# Patient Record
Sex: Female | Born: 1935 | Race: Black or African American | Hispanic: No | Marital: Single | Smoking: Former smoker
Health system: Southern US, Community
[De-identification: ages and names within clinical notes are randomized; demographics above are authoritative.]

## PROBLEM LIST (undated history)

## (undated) DIAGNOSIS — I495 Sick sinus syndrome: Secondary | ICD-10-CM

## (undated) DIAGNOSIS — Z972 Presence of dental prosthetic device (complete) (partial): Secondary | ICD-10-CM

## (undated) DIAGNOSIS — J189 Pneumonia, unspecified organism: Secondary | ICD-10-CM

## (undated) DIAGNOSIS — E079 Disorder of thyroid, unspecified: Secondary | ICD-10-CM

## (undated) DIAGNOSIS — C801 Malignant (primary) neoplasm, unspecified: Secondary | ICD-10-CM

## (undated) DIAGNOSIS — E119 Type 2 diabetes mellitus without complications: Secondary | ICD-10-CM

## (undated) DIAGNOSIS — M1990 Unspecified osteoarthritis, unspecified site: Secondary | ICD-10-CM

## (undated) DIAGNOSIS — R600 Localized edema: Secondary | ICD-10-CM

## (undated) DIAGNOSIS — N390 Urinary tract infection, site not specified: Secondary | ICD-10-CM

## (undated) DIAGNOSIS — Z96659 Presence of unspecified artificial knee joint: Secondary | ICD-10-CM

## (undated) DIAGNOSIS — R0682 Tachypnea, not elsewhere classified: Secondary | ICD-10-CM

## (undated) DIAGNOSIS — I10 Essential (primary) hypertension: Secondary | ICD-10-CM

## (undated) DIAGNOSIS — I5032 Chronic diastolic (congestive) heart failure: Secondary | ICD-10-CM

## (undated) DIAGNOSIS — Z973 Presence of spectacles and contact lenses: Secondary | ICD-10-CM

## (undated) DIAGNOSIS — R609 Edema, unspecified: Secondary | ICD-10-CM

## (undated) DIAGNOSIS — I4891 Unspecified atrial fibrillation: Secondary | ICD-10-CM

## (undated) DIAGNOSIS — H409 Unspecified glaucoma: Secondary | ICD-10-CM

## (undated) DIAGNOSIS — K219 Gastro-esophageal reflux disease without esophagitis: Secondary | ICD-10-CM

## (undated) DIAGNOSIS — R32 Unspecified urinary incontinence: Secondary | ICD-10-CM

## (undated) HISTORY — DX: Sick sinus syndrome: I49.5

## (undated) HISTORY — DX: Presence of spectacles and contact lenses: Z97.3

## (undated) HISTORY — DX: Malignant (primary) neoplasm, unspecified: C80.1

## (undated) HISTORY — PX: COLONOSCOPY: SHX174

## (undated) HISTORY — PX: ABDOMINAL HYSTERECTOMY: SHX81

## (undated) HISTORY — PX: CARPAL TUNNEL RELEASE: SHX101

## (undated) HISTORY — DX: Unspecified glaucoma: H40.9

## (undated) HISTORY — DX: Essential (primary) hypertension: I10

## (undated) HISTORY — PX: APPENDECTOMY: SHX54

## (undated) HISTORY — PX: SHOULDER SURGERY: SHX246

## (undated) HISTORY — DX: Unspecified osteoarthritis, unspecified site: M19.90

## (undated) HISTORY — PX: EYE SURGERY: SHX253

## (undated) HISTORY — PX: TOTAL HIP ARTHROPLASTY: SHX124

## (undated) HISTORY — DX: Localized edema: R60.0

## (undated) HISTORY — DX: Chronic diastolic (congestive) heart failure: I50.32

## (undated) HISTORY — DX: Presence of unspecified artificial knee joint: Z96.659

## (undated) HISTORY — DX: Unspecified urinary incontinence: R32

## (undated) HISTORY — DX: Disorder of thyroid, unspecified: E07.9

## (undated) HISTORY — PX: TONSILLECTOMY: SUR1361

## (undated) HISTORY — DX: Presence of dental prosthetic device (complete) (partial): Z97.2

---

## 1997-07-30 ENCOUNTER — Other Ambulatory Visit: Admission: RE | Admit: 1997-07-30 | Discharge: 1997-07-30 | Payer: Self-pay | Admitting: Cardiology

## 1997-08-06 ENCOUNTER — Ambulatory Visit (HOSPITAL_COMMUNITY): Admission: RE | Admit: 1997-08-06 | Discharge: 1997-08-06 | Payer: Self-pay | Admitting: Cardiology

## 1998-03-12 ENCOUNTER — Other Ambulatory Visit: Admission: RE | Admit: 1998-03-12 | Discharge: 1998-03-12 | Payer: Self-pay | Admitting: *Deleted

## 1999-03-03 ENCOUNTER — Encounter: Admission: RE | Admit: 1999-03-03 | Discharge: 1999-04-16 | Payer: Self-pay | Admitting: *Deleted

## 2000-01-27 ENCOUNTER — Encounter: Admission: RE | Admit: 2000-01-27 | Discharge: 2000-02-22 | Payer: Self-pay | Admitting: *Deleted

## 2000-03-08 ENCOUNTER — Encounter: Payer: Self-pay | Admitting: *Deleted

## 2000-03-08 ENCOUNTER — Encounter: Admission: RE | Admit: 2000-03-08 | Discharge: 2000-03-08 | Payer: Self-pay | Admitting: *Deleted

## 2000-10-04 ENCOUNTER — Encounter: Payer: Self-pay | Admitting: Orthopedic Surgery

## 2000-10-10 ENCOUNTER — Inpatient Hospital Stay (HOSPITAL_COMMUNITY): Admission: RE | Admit: 2000-10-10 | Discharge: 2000-10-13 | Payer: Self-pay | Admitting: Orthopedic Surgery

## 2000-10-10 ENCOUNTER — Encounter: Payer: Self-pay | Admitting: Orthopedic Surgery

## 2000-10-13 ENCOUNTER — Inpatient Hospital Stay
Admission: RE | Admit: 2000-10-13 | Discharge: 2000-10-25 | Payer: Self-pay | Admitting: Physical Medicine & Rehabilitation

## 2000-10-16 ENCOUNTER — Encounter: Payer: Self-pay | Admitting: Physical Medicine and Rehabilitation

## 2000-10-16 ENCOUNTER — Ambulatory Visit (HOSPITAL_COMMUNITY)
Admission: RE | Admit: 2000-10-16 | Discharge: 2000-10-16 | Payer: Self-pay | Admitting: Physical Medicine & Rehabilitation

## 2001-03-20 ENCOUNTER — Encounter: Payer: Self-pay | Admitting: Orthopedic Surgery

## 2001-03-20 ENCOUNTER — Inpatient Hospital Stay (HOSPITAL_COMMUNITY): Admission: RE | Admit: 2001-03-20 | Discharge: 2001-03-23 | Payer: Self-pay | Admitting: Orthopedic Surgery

## 2001-03-23 ENCOUNTER — Inpatient Hospital Stay
Admission: RE | Admit: 2001-03-23 | Discharge: 2001-04-04 | Payer: Self-pay | Admitting: Physical Medicine & Rehabilitation

## 2001-03-27 ENCOUNTER — Encounter (HOSPITAL_COMMUNITY)
Admission: RE | Admit: 2001-03-27 | Discharge: 2001-03-31 | Payer: Self-pay | Admitting: Physical Medicine & Rehabilitation

## 2001-03-27 ENCOUNTER — Encounter: Payer: Self-pay | Admitting: Physical Medicine & Rehabilitation

## 2001-03-31 ENCOUNTER — Encounter: Payer: Self-pay | Admitting: Physical Medicine & Rehabilitation

## 2001-09-14 ENCOUNTER — Encounter: Admission: RE | Admit: 2001-09-14 | Discharge: 2001-09-14 | Payer: Self-pay | Admitting: *Deleted

## 2001-09-14 ENCOUNTER — Encounter: Payer: Self-pay | Admitting: *Deleted

## 2001-09-21 ENCOUNTER — Encounter: Admission: RE | Admit: 2001-09-21 | Discharge: 2001-09-21 | Payer: Self-pay | Admitting: *Deleted

## 2001-09-21 ENCOUNTER — Encounter: Payer: Self-pay | Admitting: *Deleted

## 2002-01-03 ENCOUNTER — Encounter: Admission: RE | Admit: 2002-01-03 | Discharge: 2002-01-03 | Payer: Self-pay

## 2002-03-15 ENCOUNTER — Encounter (INDEPENDENT_AMBULATORY_CARE_PROVIDER_SITE_OTHER): Payer: Self-pay | Admitting: Specialist

## 2002-03-15 ENCOUNTER — Ambulatory Visit (HOSPITAL_COMMUNITY): Admission: RE | Admit: 2002-03-15 | Discharge: 2002-03-15 | Payer: Self-pay | Admitting: Gastroenterology

## 2002-10-03 ENCOUNTER — Encounter: Admission: RE | Admit: 2002-10-03 | Discharge: 2002-10-03 | Payer: Self-pay

## 2003-01-01 ENCOUNTER — Encounter: Admission: RE | Admit: 2003-01-01 | Discharge: 2003-01-01 | Payer: Self-pay

## 2004-11-09 ENCOUNTER — Encounter (HOSPITAL_COMMUNITY): Admission: RE | Admit: 2004-11-09 | Discharge: 2004-12-29 | Payer: Self-pay | Admitting: Endocrinology

## 2004-12-02 ENCOUNTER — Ambulatory Visit (HOSPITAL_COMMUNITY): Admission: RE | Admit: 2004-12-02 | Discharge: 2004-12-02 | Payer: Self-pay | Admitting: Endocrinology

## 2004-12-25 ENCOUNTER — Ambulatory Visit (HOSPITAL_COMMUNITY): Admission: RE | Admit: 2004-12-25 | Discharge: 2004-12-25 | Payer: Self-pay | Admitting: Endocrinology

## 2004-12-25 ENCOUNTER — Encounter (INDEPENDENT_AMBULATORY_CARE_PROVIDER_SITE_OTHER): Payer: Self-pay | Admitting: *Deleted

## 2005-01-15 ENCOUNTER — Ambulatory Visit (HOSPITAL_COMMUNITY): Admission: RE | Admit: 2005-01-15 | Discharge: 2005-01-15 | Payer: Self-pay | Admitting: Endocrinology

## 2006-02-16 ENCOUNTER — Encounter: Admission: RE | Admit: 2006-02-16 | Discharge: 2006-02-16 | Payer: Self-pay | Admitting: Family Medicine

## 2006-11-01 ENCOUNTER — Emergency Department: Payer: Self-pay

## 2006-11-08 ENCOUNTER — Emergency Department (HOSPITAL_COMMUNITY): Admission: EM | Admit: 2006-11-08 | Discharge: 2006-11-08 | Payer: Self-pay | Admitting: Emergency Medicine

## 2007-09-26 ENCOUNTER — Encounter: Admission: RE | Admit: 2007-09-26 | Discharge: 2007-09-26 | Payer: Self-pay | Admitting: Family Medicine

## 2008-10-16 ENCOUNTER — Encounter: Admission: RE | Admit: 2008-10-16 | Discharge: 2008-10-16 | Payer: Self-pay | Admitting: Family Medicine

## 2009-11-07 ENCOUNTER — Encounter: Admission: RE | Admit: 2009-11-07 | Discharge: 2009-11-07 | Payer: Self-pay | Admitting: Family Medicine

## 2010-02-05 ENCOUNTER — Encounter: Admission: RE | Admit: 2010-02-05 | Discharge: 2010-02-05 | Payer: Self-pay | Admitting: Family Medicine

## 2010-02-17 ENCOUNTER — Encounter: Admission: RE | Admit: 2010-02-17 | Discharge: 2010-02-17 | Payer: Self-pay | Admitting: Family Medicine

## 2010-04-26 ENCOUNTER — Encounter: Payer: Self-pay | Admitting: Family Medicine

## 2010-04-27 ENCOUNTER — Encounter: Payer: Self-pay | Admitting: Family Medicine

## 2010-08-21 NOTE — Discharge Summary (Signed)
. [facility]  Patient:    Natasha Fletcher, Natasha Fletcher Visit Number: [id number] MRN: [id number]          Service Type: ECR Location: SACU 4528 01 Attending Physician:  Faith Rogue T Dictated by:   Dian Situ, PA Admit Date:  03/23/2001 Discharge Date: 04/04/2001   CC:         Ollen Gross, M.D.  Heather Roberts, M.D.   Discharge Summary  DISCHARGE DIAGNOSES: 1. Status post total hip replacement. 2. Degenerative changes, right ankle. 3. Postoperative anemia. 4. History of urgency.  HISTORY OF PRESENT ILLNESS:  Ms. Shear is a 77 year old female with a history of asthma, DJD, bilateral hip secondary to OA, status post left total knee/hip replacement now with debilitating pain with pain and problems in mobility secondary to right hip degenerative changes.  She elected to undergo right total hip replacement, December 16, by Dr. Lequita Halt.  Postoperatively, she is touchdown weightbearing right lower extremity and on Coumadin for DVT prophylaxis.  Problems with past surgery include hyponatremia, hypokalemia that are resolving.  She was transfused with packed red blood cells secondary to a drop in her H&H to 8.0/24.1.  She has been ______ ambulating 10 feet, requiring cues to maintain touchdown weightbearing status.  PAST MEDICAL HISTORY:  Significant for OA, asthma, urinary frequency, T&A, hysterectomy, glaucoma.  ALLERGIES:  SULFA.  SOCIAL HISTORY:  The patient lives with sister in two-level home with one step as the entry.  HOSPITAL COURSE:  Ms. Joselin Crandell was admitted to [facility] on March 23, 2001, for inpatient therapy to consist of PT/OT daily.  Past admission patient was started on Lovenox cross coverage with Coumadin until ______ therapeutic basis.  The patient complains of frequency therefore ______ were checked showing no signs of retention.  Her UCS was sent off showing greater than 100,000 colonies, ______ species  free and her postoperative anemia has been followed along and H&H has been relatively stable at 9.5/28.1.  Stool guaiac ______ checked were negative.  Check of laboratory at admission showed sodium 135, potassium 3.7, chloride 101, CO2 27, BUN 7, creatinine 0.7.  Some elevation in LFTs were noted with AST at 60, ALT 59, alkaline phosphatase at 121.  Other problems during this stay have been problems with right ankle pain and edema.  She was treated with one course of colchicine with some improvement.  Bilateral lower extremity Dopplers were done secondary to right calf pain.  These were noted to be negative. X-rays of her right ankle done showing degenerative changes.  Swelling and erythema, right ankle, were much improved at the time of discharge without any signs of infection.  The patient The patient to follow up with orthopedics for any further problems regarding right ankle.  She has had problems with nausea and constipation initially on OxyContin with increased doses.  This dose was adjusted to avoid problems with nausea and problems with constipation were resolved with the use of laxatives. Ms. Kleeman has been motivated during her stay in rehabilitation.  She has met most of her goals except requiring ______ supervision for lower body care as well as toileting.  She has had supervision for transfer, supervision for ambulating 40 feet with a rolling walker.  The patients sister to provide supervision past discharge and further follow-up therapies to include home health, PT/OT, to continue with [facility].  Patient to continue on Coumadin for two additional weeks to complete her DVT prophylaxis. Her INI at the time of discharge is  therapeutic at 2.8.  Patient to have next Pro-Time April 07, 2001, with [facility] pharmacy to follow and adjust Coumadin. On April 04, 2001, patient is discharged to home.  DISCHARGE MEDICATIONS:  Coumadin 3 mg p.o. per day, Protonix 40 mg  per day, cephalaxin 400 mg p.o. t.i.d., OxyContin CR 20 mg q.12h., Oxycodone IR 5-10 mg p.o. q.4-6h. p.r.n. pain, Adivair 500-50 two puffs b.i.d., Elavil 25 mg q.h.s., albuterol MDI two puffs b.i.d., ______ one p.o. b.i.d., Senokot S two p.o. q.h.s.  ACTIVITIES:  Touchdown weightbearing on right leg ______ with walker ______ supervision.  DIET:  Regular.  WOUND CARE:  Keep your leg clean and dry.  SPECIAL INSTRUCTIONS:  Patient to follow up with Dr. Lequita Halt in two weeks for postoperative check.  Follow up with Dr. Orson Aloe for routine check.  Follow up with Dr. Riley Kill as needed. Dictated by:   Dian Situ, PA Attending Physician:  Faith Rogue T DD:  05/11/01 TD:  05/12/01 Job: [id number]

## 2010-08-21 NOTE — Discharge Summary (Signed)
[place]  Patient:    Natasha Fletcher, Natasha Fletcher Visit Number: [id number] MRN: [id number]          Service Type: ECR Location: SACU 4504 01 Attending Physician:  Herold Harms Dictated by:   Dian Situ, PA Adm. Date:  10/13/2000 Disc. Date: 10/25/2000   CC:         Ollen Gross, M.D.  Heather Roberts, M.D.   Discharge Summary  DISCHARGE DIAGNOSES: 1. Status post left total hip replacement. 2. Postoperative anemia. 3. Urinary retention, resolved.  HISTORY OF PRESENT ILLNESS:  Natasha Fletcher is a 77 year old female with history of hypertension, glaucoma, bilateral hip pain admitted to [facility] October 10, 2000, for left total hip replacement by Ollen Gross, M.D.  Postop, is touchdown weightbearing, on Coumadin for DVT prophylaxis.  INR is 1.8 today. She was noted to have issues with postop anemia with H&H of 8.2 and 25.1 and is currently in the process of being transfused. She was also noted to have hyponatremia with sodium at 129 on last check.  Currently, the patient is at [facility]. assist for transfers, min. assist ambulating 20 feet with rolling walker. The patient transferred to subacute for lower level therapies.  PAST MEDICAL HISTORY:  Significant for hypertension, glaucoma, hysterectomy, tonsillectomy, asthma, ______________, history of frequency, urgency and insomnia.  ALLERGIES:  SULFA.  SOCIAL HISTORY:  The patient lives with sister in trilevel home with one step at entry.  She does not use any tobacco or alcohol.  HOSPITAL COURSE:  Natasha Fletcher is admitted to subacute on October 13, 2000, for inpatient therapies to consist of PT and OT daily.  Past admission, the patient was maintained on Coumadin for DVT prophylaxis. She was started on iron supplements past her transfusion.  The patient had issues with pain control and OxyContin CR was added for more consistent pain relief and Skelaxin was scheduled for  problems with muscle spasms.  The patient reports a history of hyperactive bladder and PVRs were checked and showing patient to occasionally have volumes in the low 200s.  She was started on Flomax to help assist with emptying.  A UA/UCS was sent off on October 13, 2000, and October 17, 2000, which showed multiple species.  Flomax was discontinued.  The patient reported having problems with incontinence on this.  She has used Elavil in the past to help with urgency and this was increased to 50 mg q.h.s. for her symptomatology.  The patients hip incision was noted to be healing well without any signs or symptoms of infection, no drainage, no erythema noted.  The patients PT/INR 25.3 and 3.0 at the time of discharge.  The patient is discharged on 3 mg Coumadin per day to continue through November 20, 2000.  At the time of discharge the patient was at supervision level for ADL needs, close supervision for transfers, close supervision for ambulating 25 feet with a rolling walker and touchdown weightbearing status. Supervision is required by sister and further follow-up therapies to include home health PT and OT to be provided by [facility].  On October 25, 2000, the patient is discharged to home.  DISCHARGE MEDICATIONS: 1. Advair and Proventil inhaler to resume home dose. 2. Fosamax weekly on Mondays. 3. Elavil 25 mg q.h.s. 4. Coumadin 3 mg per day through November 20, 2000. 5. Skelaxin 400 mg q.6h. 6. Trinsicon one p.o. b.i.d. 7. OxyContin CR 10 mg b.i.d. 8. Oxycodone IR 5 to 10 mg p.o. q.4-6h.  p.r.n. pain.  ACTIVITY:  Use walker, observe hip precautions. Touchdown weightbearing.  DIET:  Regular.  WOUND CARE:  Keep area clean and dry.  SPECIAL INSTRUCTIONS:  [facility] home [facility] to provide PT, OT and R.N. with protime drawn October 27, 2000.  FOLLOW-UP:  The patient is to follow up with Dr. Lequita Halt in two weeks, to follow up with Heather Roberts, M.D., in four to six weeks for  recheck, to follow up with Reuel Boom L. Thomasena Edis, M.D., as needed. Dictated by:   Dian Situ, PA Attending Physician:  Herold Harms DD:  11/28/00 TD:  11/29/00 Job: [id number]

## 2010-08-21 NOTE — H&P (Signed)
[facility]  Patient:    Natasha Fletcher, Natasha Fletcher                      MRN: [id number] Adm. Date:  10/10/00 Attending:  Ollen Gross, M.D. Dictator:   Druscilla Brownie. Shela Nevin, P.A. CC:         Heather Roberts, M.D.   History and Physical  DATE OF BIRTH:  1925-10-26  CHIEF COMPLAINT:  Pain in my left hip.  HISTORY OF PRESENT ILLNESS:  This is a 77 year old female who has had continuing progressive problems concerning bilateral hip pain.  She has been seen by Dr. Fannie Knee in our practice who has now retired, and referred to Dr. Ollen Gross for evaluation and definitive treatment.  This lady retired about months ago and has had marked increase in her pain and discomfort.  She now has difficulty getting about.  She has been an active lady throughout her life time, she now finds this to be extremely interfering.  She requests definitive treatment.  Examination of the hips show flexion limited to 80 degrees.  She has about 5 degrees of internal rotation on the left with 0 on the right.  Other range of motion is markedly limited.  She walks with an antalgic gait with a lurch to the left.  X-rays shows the patient has developed severe protrusio deformity bilaterally with bone-on-bone changes and cystic changes within the acetabulum and femoral head.  Due to the markedly and rather rapid deterioration over the last few months of this patients lifestyle, it is felt she would benefit from surgical intervention and is being admitted for total hip replacement arthroplasty on the left.  In all probability we will do the right in the not too distant future.  The patients family physician is Dr. Heather Roberts.  She has been notified today, and she will fax [place] a clearance for this patients surgical intervention.  PAST MEDICAL HISTORY:  Overall this lady has been in pretty good health throughout her life time.  She was a Designer, jewellery.  She has had  a tonsillectomy and a hysterectomy in the past.  She has glaucoma, mild hypertension, history of pneumonia in the past, and has asthma.  CURRENT MEDICATIONS: 1. Various inhalers. 2. Proventil 1 q.d. 3. Pilocarpine drops q.d. 4. Premarin q.d. 5. Fosamax q.d. 6. cyclobenzaprine 1 at h.s. 7. Amitriptyline 1 at bed time.  ALLERGIES:  The patient is allergic to SULFA.  SOCIAL HISTORY:  The patient neither smokes or drinks.  She is retired.  FAMILY HISTORY:  Noncontributory.  REVIEW OF SYSTEMS:  CNS:  No seizure disorder, paralysis, numbness, or double vision.  RESPIRATORY:  No productive cough, hemoptysis, no shortness of breath.  The patient does have some difficulty in breathing from time to time in which she uses her inhalers with little trouble.  CARDIOVASCULAR:  No chest pain, no angina, no orthopnea.  GASTROINTESTINAL:  No nausea or vomiting, melena, or bloody stool.  GENITOURINARY:  No discharge, dysuria, hematuria. MUSCULOSKELETAL:  Primarily in present illness.  PHYSICAL EXAMINATION:  GENERAL:  Alert, cooperative, and friendly, 77 year old female.  VITAL SIGNS:  Blood pressure 170/110 left arm, 180/90 right arm seated.  Pulse 80 and regular, respirations 20 and unlabored.  HEENT:  Normocephalic.  PERRLA.  EOM intact.  Oropharynx is clear.  LUNGS:  Chest clear to auscultation, no rhonchi, no wheezes, no rales.  HEART:  Regular rate and rhythm, no murmurs were heard.  ABDOMEN:  Soft, flat, nontender.  Liver and spleen not felt.  GENITALIA/RECTAL/PELVIC/BREASTS:  Not done, not pertinent to present illness.  EXTREMITIES:  Hips, particularly left as in present illness above.  ADMITTING DIAGNOSES: 1. 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 2. Mild hypertension. 3. Glaucoma. 4. Sulfa allergy.  PLAN:  The patient will be admitted for total hip replacement arthroplasty of the left hip.  We will certainly ask Dr. Orson Aloe or one of her  associates to follow along with [place] should we have any medical problems while in the hospital.  During this dictation an approval and clearance for surgery was handed to me from Dr. Heather Roberts. DD:  10/04/00 TD:  10/04/00 Job: [id number]

## 2010-08-21 NOTE — Discharge Summary (Signed)
[facility]  Patient:    Natasha Fletcher Visit Number: [id number] MRN: [id number]          Service Type: SUR Location: 4W 0468 01 Attending Physician:  Loanne Drilling Dictated by:   Marcie Bal Troncale, P.A.C. Admit Date:  03/20/2001 Disc. Date: 03/23/01   CC:         Heather Roberts, M.D.  Faith Rogue, M.D.   Discharge Summary  PRIMARY DIAGNOSES: 1. Osteoarthritis, right hip. 2. Acute blood loss anemia. 3. Hyponatremia. 4. Hypokalemia.  SECONDARY DIAGNOSES: 1. Osteoarthritis. 2. Asthma. 3. Urinary frequency.  SURGICAL PROCEDURE:  Right total hip arthroplasty with acetabular autografting by Dr. Lequita Halt with the assistance of Karie Chimera, P.A.-C. on March 20, 2001.  CONSULTATIONS:  Dr. Riley Kill in physical medicine and rehabilitation.  LABORATORY DATA:  Preop H&H were 11.4 and 33.4, respectively.  She did reach a low on postop day #3, December 19, of 8.0 and 24.1, respectively.  PT and INR preop were 13.5 and 1.0, respectively.  Prior to discharge, it was 15.8 and 1.4.  Routine chemistry preop was all within normal limits with a BUN and creatinine of 11 and 0.7, respectively, and a sodium and potassium of 1.38 and 4.1, respectively.  She did reach a low on December 18 of 131 and 3.4, respectively, for a sodium and potassium.  With treatment, this improved to 134 and 3.8, respectively, for this sodium and potassium.  Urinalysis was considered negative with negative nitrite and leukocyte esterase with just 3-6 white blood cells.  CHIEF COMPLAINT:  Right hip pain.  HISTORY OF PRESENT ILLNESS:  Natasha Fletcher is a 77 year old female who had previously undergone a left total hip arthroplasty earlier in the year with good results.  She has now had progressively worsening debilitating pain to her right hip.  She has a significant limp and ambulates with the assistance of a cane.  She has been on OxyContin for pain control, which  really no longer is helping control her pain.  Given the findings on her radiographic and clinical exams, it was recommended she may benefit from surgical intervention. She was scheduled for surgery for March 20, 2001.  Prior to surgery, Dr. Heather Roberts gave preoperative medical clearance.  HOSPITAL COURSE:  Following the surgical procedure, the patient was taken to the PACU in stable condition and transferred to the orthopedic floor in good condition.  She did well postoperatively.  She received the routine postoperative antibiotics and pain medications.  She was started on Coumadin for DVT prophylaxis and reached an INR of 1.4 prior to discharge.  She was treated for hyponatremia postop with a fluid restriction, which improved her sodium.  Also, she was treated for hypokalemia with improvement as well.  She progressed well with physical therapy.  The wound was examined on postop day #2 subsequently and found to be clean, dry, and intact without signs or symptoms of infection.  On postop day #3, she was transfused 2 units of packed red blood cells for hemoglobin of 8.0.  She really was asymptomatic.  On postoperative day #3, with transfusion, she was stable and ready for transfer to the [facility].  DISPOSITION:  She is being discharged to the subacute rehab unit.  Diet is regular.  Followup with Dr. Lequita Halt two weeks postop.  Call [phone] for an appointment.  Activity is touchdown weightbearing.  Total hip dislocation precautions.  Once daily dressing changes.  May shower on postop day #5. Note, a BMET and an H&H should  be repeated in the morning, on December 20.  CONDITION ON DISCHARGE:  Good and improved.  DISCHARGE MEDICATIONS:  1. Colace 100 mg p.o. b.i.d.  2. Coumadin per pharmacy dosing.  3. Amitriptyline 25 mg p.o. q.h.s.  4. Advair 2 puffs twice daily.  5. Albuterol 2 puffs twice daily.  6. Pilocarpine 4% ophthalmic solution 1 drop in each eye twice daily.  7. K-Dur 20  mEq p.o. q.d.  8. Laxative of choice/enema of choice.  9. Percocet 5/325 one or two p.o. q.4h. p.r.n. pain. 10. Robaxin 500 mg p.o. q.6-8h. p.r.n. spasm. 11. Ditropan 5 mg p.o. q.8h. p.r.n. Dictated by:   Marcie Bal Troncale, P.A.C. Attending Physician:  Loanne Drilling DD:  03/23/01 TD:  03/23/01 Job: [id number]

## 2010-08-21 NOTE — Op Note (Signed)
[facility]  Patient:    Natasha Fletcher, Natasha Fletcher Visit Number: [id number] MRN: [id number]          Service Type: SUR Location: 4W 0468 01 Attending Physician:  Loanne Drilling Dictated by:   Ollen Gross, M.D. Proc. Date: 03/20/01 Admit Date:  03/20/2001                             Operative Report  PREOPERATIVE DIAGNOSIS:  Osteoarthritis, right hip.  With protrusio deformity.  POSTOPERATIVE DIAGNOSIS:  Osteoarthritis of right hip, with protrusio deformity.  PROCEDURE:  Right total hip arthroplasty, with acetabular autografting.  SURGEON:  Ollen Gross, M.D.  ASSISTANT:  Ottie Glazier. Wynona Neat, P.A.-C.  ANESTHESIA:  Spinal.  ESTIMATED BLOOD LOSS:  500 cc.  DRAINS:  Hemovac x 1.  COMPLICATIONS:  None.  CONDITIONS:  Stable to recovery room.  INDICATIONS:  Natasha Fletcher is a 77 year old female who had severe osteoarthritis and protrusio deformity of both hips; now status post very successful left total hip arthroplasty.  She presents now for right total hip arthroplasty secondary to pain intractable and refractory to nonoperative management.  PROCEDURE IN DETAIL:  After successful administration of spinal anesthetic, the patient was placed in the left lateral decubitus position with the right side up and held with a hip positioner.  The right lower extremity was isolated from her perineum with plastic drapes, and prepped and draped in the usual sterile fashion.  Standard posterolateral incision was made and skin was cut with 10 blade at subcutaneous tissue to the level of fascia lata.  Sciatic nerve was palpated and protected and short external rotator was isolated off the femur.  Capsulectomy was performed.  Hip is then dislocated and set in the femoral head marked.  Trial prosthesis is placed such that the center of the trial head corresponds to the center of her native femoral head. Osteotomy was then made with an oscillating saw.  The femur is  retracted anteriorly and acetabular exposure obtained.  She had a great protrusio deformity.  Labrum is removed.  With a 43 reamer, I reamed centrally just to remove the sclerotic bone bed.  Then the rim is reamed with a 49 and 51.  I utilized the cancellous bone from her femoral head to bone graft centrally.  The 52 mm pinnacle acetabular shell is then impacted into the acetabulum in her anatomic position, and then transfixed with dome screws.  Trial 28 mm neutral liner is placed.  Femur is prepared.  Axial reaming up to 13.5.  Proximal reaming to a 18-D, and the sleeve machine to a large.  Trial 18-B large sleeve is placed, with an 18 x 13 stem and a 30+ foreign neck (per the opposite side).  Trial 28+ zero head is placed.  Hip is reduced.  There is great stability with full extension, flexion and rotation at 70 degrees flexion and 40 degrees adduction; 70 degrees internal rotation and 90 degrees flexion, 70 degrees internal rotation.  The hip is then dislocated.  Trial is removed and a permanent apex hole eliminator and permanent 28 mm neutral acetabular liner placed.  Utilizing marathon polyethylene, the femur is prepared.  An 18-D large sleeve, 18 x 13 stem with 30+ four neck -- matching her native anteversion.  A 28+ zero head placed.  The hip reduced with same stability parameters.  The wound is copiously irrigated with antibiotic solution.  The short external rotator is reattached to  the femur through drill holes.  Fascia lata is closed over a Hemovac drain with interrupted #1 Vicryl.  Subcutaneous tissue closed with interrupted 2-0 Vicryl, and #1 Vicryl and subcuticular running 4-0 Monocryl.  Skin is clean and dry and Steri-Strips and bulky sterile dressing applied.  The patient was awakened and transported to recovery room in stable condition. Dictated by:   Ollen Gross, M.D. Attending Physician:  Loanne Drilling DD:  03/20/01 TD:  03/20/01 Job: [id number]

## 2010-08-21 NOTE — Op Note (Signed)
[facility]  Patient:    Natasha Fletcher, Natasha Fletcher                 MRN: [id number] Proc. Date: 10/10/00 Adm. 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  PROCEDURE:  Left total hip arthroplasty with acetabular autografting.  SURGEON:  Dr. Lequita Halt.  ASSISTANT:  Ralene Bathe, P.A.  ANESTHESIA:  Spinal.  ESTIMATED BLOOD LOSS:  300  DRAINS:  Hemovac x 1.  COMPLICATIONS:  None.  CONDITION:  Stable to recovery.  BRIEF CLINICAL NOTE:  Natasha Fletcher is a 77 year old female with severe osteoarthritis and grade 4 protrusion deformity of both hips. She has had pain refractory to nonoperative management and presents now for a left total hip arthroplasty as the left is currently more symptomatic.  DESCRIPTION OF PROCEDURE:  After successful administration of spinal anesthetic, the patient was placed in the right lateral decubitus position with the left side up and held with the hip positioner. The left lower extremity was isolated from her perineum with plastic drapes and prepped and draped in the usual sterile fashion. A standard posterolateral incision was made with a 10 blade through the subcutaneous tissue to the level of the fascia lata which was incised in line with the skin incision. Short external rotators were isolated off the femur and then capsulectomy performed. The hip was dislocated and the center of the femoral head marked. The trial prosthesis was placed such that the center of the trial head corresponds to the center of her native femora head. Osteotomy was then made with an oscillating saw.  The femur was retracted anteriorly and anterior capsular removed. Acetabular exposure was then obtained. I just used with 43 mm reamer to ream  the central part of the protrusio defect just to remove the cartilage and allow for bleeding bone. We then went up to a 49 for peripheral reaming and stopped at a 51. The femoral head cancellous bone is then morcellized using a reamer to remove it from the cortical shell. This is then placed into the center part of the acetabular defect to build the acetabulum back out to a more anatomic position. A 51 mm reamer is then placed in reverse to create a concentric acetabulum. The 52 mm acetabular shell is then impacted matching her native anteversion with a great fit and is transfixed with two additional dome screws.  A trial acetabular 28 mm neutral liner is placed. Femoral preparation is then initiated first with canal reaming up to 13.5 mm and then proximal reaming up to an 18D and a radiculotome is placed up to a large with a sleeve. An 18D large trial sleeve is placed. An 18 x 13 stem with a 36 neck is first placed but we could not get a reduction because of the length which was actually higher than she was preop. With a 30 plus 4 neck, we matched the appropriate length relationship and with a 28 plus zero head she was reduced. Her native anteversion was about 30 degrees, so I put her in 20 degrees of anteversion. She is reduced with a 28 plus zero head and has great stability with full extension, full external rotation, 70 degrees of  flexion, 40 degrees adduction, and 90 degrees internal rotation, and then 90 degrees of flexion, 90 degrees internal rotations. Trials were then removed.  The apex hole eliminator was placed into the acetabular shell and a permanent 28 mm neutral marathon liner is impacted into the shell. The sleeve, 18D large, was then impacted into the proximal femur and then the 18  13 stem with a 30 plus 4 neck is placed into the femur with approximately 20 degrees of anteversion. The permanent 28 plus zero head is placed, hip reduced and same stability parameters  noted. The wound is copiously irrigated with antibiotic solution and short external rotators are reattached to the femur through drill holes. The fascia lata is closed over Hemovac drains with interrupted #1 Vicryl, subcu is closed with multiple layers with interrupted #1 Vicryl and 2-0 Vicryl. The subcuticular was closed with running 4-0 monocryl. The incision was cleaned and dried and Steri-Strips and a bulky sterile sterile dressing applied. The patient was then awakened and transported to recovery in stable condition. DD:  10/10/00 TD:  10/10/00 Job: [id number]

## 2010-08-21 NOTE — Op Note (Signed)
NAME:  Natasha Fletcher, Natasha Fletcher                          ACCOUNT NO.:  [id number]   MEDICAL RECORD NO.:  [id number]                   PATIENT TYPE:  AMB   LOCATION:  ENDO                                 FACILITY:  [facility]   PHYSICIAN:  Petra Kuba, M.D.                 DATE OF BIRTH:  [birth year]   DATE OF PROCEDURE:  03/15/2002  DATE OF DISCHARGE:                                 OPERATIVE REPORT   PROCEDURE:  Esophagogastroduodenoscopy.   ADDITIONAL MEDICINES USED:  Demerol 10, Versed 2.   DESCRIPTION OF PROCEDURE:  The video endoscope was inserted by direct vision  and the esophagus was normal. In the distal esophagus was a moderate sized  hiatal hernia with a widely patent thin ring. The scope passed into the  stomach, advanced toward the normal antrum, normal pylorus into the duodenal  bulb where a small AVM was seen. It was washed and watched but could not be  made to bleed. The scope was inserted around the C loop to a normal second,  third and probably even fourth part of the duodenum. No other AVMs or early  signs of bleeding were seen. The scope was slowly withdrawn back to the bulb  without additional findings. A good look at the bulb confirmed the AVM and  ruled out any additional findings. The AVM was again washed one more time  causing it to bleed. The scope was withdrawn back to the stomach and  retroflexed. High in the cardia, the hiatal hernia was confirmed. There were  possibly some very early minimal Cameron lesions there with some linear  erythema and some minimal gastritis on retroflexion. A good look at the  angularis, fundus, lesser and greater curve did not reveal any additional  findings. Straight visualization of the stomach confirmed minimal gastritis  and ruled out any additional findings. Air was suctioned, the scope slowly  withdrawn. Again a good look at the hiatal hernia pouch and esophagus did  not reveal any additional findings. The scope was removed. The  patient  tolerated the procedure well. There was no obvious or immediate  complication.   ENDOSCOPIC DIAGNOSIS:  1. Moderate hiatal hernia with a widely patent thin ring.  2. Questionable minimal Cameron lesions.  3. Minimal gastritis.  4. Bulb AVM small unable to make bleed.  5. Otherwise within normal limits to the third or fourth part of the     duodenum.    PLAN:  Trial of over the counter Prilosec or if she has a prescription card  once a day Protonix to help decrease acid and either help prevent the  [facility] lesions from bleeding or the AVM. I will see her back p.r.n. or in  two months to recheck CBCs, guaiacs and decide any other workup and plans or  more aggressive treatment for the one AVM. Happy to see back sooner p.r.n.  Petra Kuba, M.D.    MEM/MEDQ  D:  03/15/2002  T:  03/15/2002  Job:  [id number]   cc:   Christella Noa, M.D.  [address] [place]  [place], [place] [address]  Fax: [phone]

## 2010-08-21 NOTE — H&P (Signed)
[facility]  Patient:    Natasha Fletcher, Natasha Fletcher Visit Number: [id number] MRN: [id number]          Service Type: Attending:  Ollen Gross, M.D. Dictated by:   Ralene Bathe, P.A. Adm. Date:  03/13/01   CC:         Heather Roberts, M.D.   History and Physical  DATE OF BIRTH:  1925-10-04  CHIEF COMPLAINT:  Right hip pain.  HISTORY OF PRESENT ILLNESS:  [name] is a 77 year old female, well known to our practice.  She had done extremely well with a left total hip arthroplasty back this previous year and has had known osteoarthritis, end-stage of the right hip.  She has now progressed to severe debilitating pain.  She is on a cane for any ambulation and moves with significant limp and discomfort.  She is on OxyContin for mere pain control which is no longer assisting her.  At this time she wishes to proceed with total hip replacement arthroplasty as she has done well with her previous one.  The risks and benefits were discussed with the patient at length and she is in agreement and wishes to proceed.  PAST MEDICAL HISTORY: 1. Osteoarthritis. 2. Asthma. 3. Urinary frequency.  PAST SURGICAL HISTORY: 1. Left total hip arthroplasty. 2. Tonsillectomy and adenoidectomy. 3. Hysterectomy.  ALLERGIES:  SULFA causes nausea and vomiting.  MEDICATIONS: 1. Oxybutynin 5 mg one q.8 p.r.n. 2. Amitriptyline 25 mg one q.h.s. 3. Percocet 10/651 every six hours as needed for pain or hydrocodone 5/500 one    every 4-6 p.r.n. pain. 4. Pilocarpine eye drops two each eye daily. 5. Advair inhaler 500/50 two puffs b.i.d. 6. Proventil two puffs b.i.d.  FAMILY HISTORY:  Mother with high blood pressure and osteoarthritis.  Father with cancer.  SOCIAL HISTORY:  She lives with her sister.  She does not smoke or drink.  FAMILY PHYSICIAN:  Dr. Heather Roberts provided medical clearance.  REVIEW OF SYSTEMS:  The patient denies any recent fevers, chills, night sweats, and  bleeding tendencies.  CNS:  No blurred or double vision, seizures, paralysis.  RESPIRATORY:  No shortness of breath, productive cough, hemoptysis.  CARDIOVASCULAR:  No chest pain, angina, orthopnea. GASTROINTESTINAL:  No nausea, vomiting, constipation, melena, or bloody stools.  GENITOURINARY:  No dysuria.  She does have urinary frequency for which she takes oxybutynin.  No blood in the urine.  She does have occasional urinary tract infections.  Dr. Retta Diones is her urologist.  PHYSICAL EXAMINATION:  GENERAL:  Well-developed, well-nourished, 77 year old female, actually appears a bit younger than stated age.  VITAL SIGNS:  Blood pressure 132/72, respirations 12, pulse 68.  HEENT:  Normocephalic.  Extraocular motion is intact.  NECK:  Supple, no lymphadenopathy, no carotid bruits appreciated on exam.  CHEST:  Clear to auscultation bilaterally.  No rales, no rhonchi.  HEART:  Regular rate and rhythm.  No murmurs, gallops, rubs, heaves, thrills.  ABDOMEN:  Positive bowel sounds, soft, nontender.  EXTREMITIES:  No pitting edema, neurovascularly intact.  Hip exam shows no range of motion to the right hip.  There is extreme pain with any attempted range of motion.  She is able to flex at about 90 degrees with only 10-15 degrees of abduction.  NEUROLOGIC:  Neurovascularly she is intact to distal pulses.  IMPRESSION: 1. End-stage osteoarthritis, right hip. 2. Osteoarthritis. 3. Asthma. 4. Urinary frequency.  PLAN:  The patient will be admitted for right total hip arthroplasty.  We will get a rehab consult  as she went to rehab postoperatively on her last one. However, if she does extremely well, she could possibly go home with her sister.  We will follow her progression there.  Her family physician is Dr. Heather Roberts.  We will notify her of her admission. Dictated by:   Ralene Bathe, P.A. Attending:  Ollen Gross, M.D. DD:  03/07/01 TD:  03/07/01 Job: [id number]

## 2010-08-21 NOTE — Op Note (Signed)
   NAME:  Natasha Fletcher, Natasha Fletcher                          ACCOUNT NO.:  [id number]   MEDICAL RECORD NO.:  [id number]                   PATIENT TYPE:  AMB   LOCATION:  ENDO                                 FACILITY:  [facility]   PHYSICIAN:  Petra Kuba, M.D.                 DATE OF BIRTH:  1925/06/15   DATE OF PROCEDURE:  03/15/2002  DATE OF DISCHARGE:                                 OPERATIVE REPORT   PROCEDURE:  Colonoscopy with polypectomy.   INDICATIONS FOR PROCEDURE:  Dark stool anemia due for colonic screening.   Consent was signed after risks, benefits, methods, and options were  thoroughly discussed in the office.   MEDICINES USED:  Demerol 70, Versed 7.   DESCRIPTION OF PROCEDURE:  Rectal inspection was pertinent for external  hemorrhoids, small. Digital exam was negative. The pediatric video  adjustable colonoscope was inserted and fairly easily advanced around the  colon to the cecum. This did require rolling on her on her back and some  abdominal pressure. On insertion, some left greater than right diverticula  were seen. The cecum was identified by the appendiceal orifice and the  ileocecal valve. The prep was adequate. There was some brown stool that  required washing and suctioning but no signs of bleeding. On slow withdrawal  in the distal ascending, a small 3 mm polyp was seen and was hot biopsied  x2. The scope was further withdrawn. Other than the left greater then right  diverticula, no additional polyps, masses or other abnormalities were seen  as we slowly withdrew back to the rectum. Once back in the rectum, the scope  was retroflexed pertinent for some internal hemorrhoids. The scope was  straightened and readvanced a short ways up the left side of the colon, air  was suctioned, scope removed. The patient tolerated the procedure well.  There was no obvious or immediate complications.   ENDOSCOPIC DIAGNOSIS:  1. Internal and external hemorrhoids.  2. Left greater than  right diverticula.  3. Small ascending polyp hot biopsied.  4. Otherwise within normal limits to the cecum without any blood being seen.   PLAN:  Await pathology to determine future colonic screening. Continue  workup with an EGD. Please see that dictation for further workup and plans  and recommendations.                                               Petra Kuba, M.D.    MEM/MEDQ  D:  03/15/2002  T:  03/15/2002  Job:  [id number]   cc:   Christella Noa, M.D.  [address] [place]  [place], [place] [address]  Fax: [phone]

## 2010-08-21 NOTE — Discharge Summary (Signed)
[facility]  Patient:    Natasha Fletcher, Natasha Fletcher                 MRN: [id number] Adm. Date:  [id number] Disc. Date: 10/13/00 Attending:  Loanne Drilling Dictator:   Druscilla Brownie. Shela Nevin, P.A. CC:         Heather Roberts, M.D.   Discharge Summary  ADMITTING DIAGNOSES: 1. Severe osteoarthritis, bilateral hips with protrusio and cystic changes    worse on the left. 2. Mild hypertension. 3. Glaucoma. 4. Sulfa allergy. 5. Asthma.  DISCHARGE DIAGNOSES: 1. Severe osteoarthritis, bilateral hips with protrusio and cystic changes    worse on the left. 2. Mild hypertension. 3. Glaucoma. 4. Sulfa allergy. 5. Asthma. 6. Postoperative anemia. 7. Hyponatremia (resolved).  CONSULTS:  Dr. Heather Roberts, internal medicine.  OPERATION:  On October 10, 2000 the patient underwent left total hip replacement arthroplasty with acetabular autografting.  Ralene Bathe, P.A.-C assisted.  BRIEF HISTORY:  This 77 year old lady with severe bilateral grade 4 protrusio osteoarthritis to both hips has had increasing difficulty in getting about. She is having more and more problems.  The left is far more symptomatic than the right and it is felt this patient would benefit for surgical intervention and was admitted for the above procedure.  HOSPITAL COURSE:  The patient tolerated the surgical procedure quite well. Was placed in the total joint camp.  She did quite well after surgery.  It was noted by her postoperative laboratory values that she had hyponatremia.  We treated this by fluid restriction, increasing her NACL IV.  This eventually resolved.  We asked Dr. Orson Aloe to help [place] with the patient.  She saw the patient once and remained on-call for [place].  The patients hemoglobin dropped postoperatively to 8.2 with a hematocrit of 25.7.  We felt this was on the lower range, particularly in light of the fact this patient was light-headed, felt "weak," and had progressing  tachycardia. It was decided that she would benefit from a transfusion.  She agreed to this after discussion between her and Dr. Lequita Halt.  She will receive her transfusion and then be transferred to [facility].  Neurovascular status remained intact in the left lower extremity.  Wound remained dry.  It was felt this patient was stable enough to go to [facility], particularly in the light of the fact that there was a bed available. Arrangements were made for transfer.  LABORATORIES:  Preoperative hemoglobin 11.2, hematocrit 34.2, final hemoglobin 8.2, hematocrit 25.7.  While in the hospital her sodium dropped as low as 129. Urinalysis negative for urinary tract infection.  Chest x-ray showed "no active disease."  Electrocardiogram showed normal sinus rhythm.  CONDITION ON DISCHARGE:  Improved, stable.  PLAN:  The patient is transferred to subacute care unit of the [facility] to continue with total hip protocol.  She has been at touchdown weightbearing to the operative extremity.  She will need to maintain this for at least six weeks.  We need to watch her hemoglobin closely as well as her electrolytes, particularly sodium.  Should there be any medical questions concerning this patients in-hospital management other than that that can be provided by the staff of the rehabilitation center, then Dr. Heather Roberts needs to be contacted. DD:  10/13/00 TD:  10/13/00 Job: [id number]

## 2011-01-26 ENCOUNTER — Other Ambulatory Visit: Payer: Self-pay | Admitting: Internal Medicine

## 2011-01-26 DIAGNOSIS — Z1231 Encounter for screening mammogram for malignant neoplasm of breast: Secondary | ICD-10-CM

## 2011-02-17 ENCOUNTER — Ambulatory Visit: Payer: Self-pay

## 2011-02-26 ENCOUNTER — Ambulatory Visit
Admission: RE | Admit: 2011-02-26 | Discharge: 2011-02-26 | Disposition: A | Discharge status: Home / Self Care | Payer: Medicare Other | Source: Ambulatory Visit | Attending: Internal Medicine | Admitting: Internal Medicine

## 2011-02-26 DIAGNOSIS — Z1231 Encounter for screening mammogram for malignant neoplasm of breast: Secondary | ICD-10-CM

## 2011-03-04 ENCOUNTER — Other Ambulatory Visit: Payer: Self-pay | Admitting: Internal Medicine

## 2011-03-04 DIAGNOSIS — R928 Other abnormal and inconclusive findings on diagnostic imaging of breast: Secondary | ICD-10-CM

## 2011-03-18 ENCOUNTER — Other Ambulatory Visit: Payer: Self-pay | Admitting: Internal Medicine

## 2011-03-18 ENCOUNTER — Ambulatory Visit
Admission: RE | Admit: 2011-03-18 | Discharge: 2011-03-18 | Disposition: A | Discharge status: Home / Self Care | Payer: Medicare Other | Source: Ambulatory Visit | Attending: Internal Medicine | Admitting: Internal Medicine

## 2011-03-18 DIAGNOSIS — R928 Other abnormal and inconclusive findings on diagnostic imaging of breast: Secondary | ICD-10-CM

## 2011-03-19 ENCOUNTER — Other Ambulatory Visit: Payer: Self-pay | Admitting: Internal Medicine

## 2011-03-19 DIAGNOSIS — R928 Other abnormal and inconclusive findings on diagnostic imaging of breast: Secondary | ICD-10-CM

## 2011-03-24 ENCOUNTER — Ambulatory Visit
Admission: RE | Admit: 2011-03-24 | Discharge: 2011-03-24 | Disposition: A | Discharge status: Home / Self Care | Payer: Medicare Other | Source: Ambulatory Visit | Attending: Internal Medicine | Admitting: Internal Medicine

## 2011-03-24 ENCOUNTER — Other Ambulatory Visit: Payer: Self-pay | Admitting: Internal Medicine

## 2011-03-24 DIAGNOSIS — N6310 Unspecified lump in the right breast, unspecified quadrant: Secondary | ICD-10-CM

## 2011-03-24 DIAGNOSIS — R928 Other abnormal and inconclusive findings on diagnostic imaging of breast: Secondary | ICD-10-CM

## 2011-03-25 ENCOUNTER — Ambulatory Visit
Admission: RE | Admit: 2011-03-25 | Discharge: 2011-03-25 | Disposition: A | Discharge status: Home / Self Care | Payer: Medicare Other | Source: Ambulatory Visit | Attending: Internal Medicine | Admitting: Internal Medicine

## 2011-03-25 DIAGNOSIS — N6310 Unspecified lump in the right breast, unspecified quadrant: Secondary | ICD-10-CM

## 2011-03-26 ENCOUNTER — Other Ambulatory Visit: Payer: Self-pay | Admitting: Internal Medicine

## 2011-03-26 ENCOUNTER — Other Ambulatory Visit: Payer: Self-pay | Admitting: *Deleted

## 2011-03-26 ENCOUNTER — Telehealth: Payer: Self-pay | Admitting: *Deleted

## 2011-03-26 DIAGNOSIS — C50419 Malignant neoplasm of upper-outer quadrant of unspecified female breast: Secondary | ICD-10-CM

## 2011-03-26 DIAGNOSIS — C50911 Malignant neoplasm of unspecified site of right female breast: Secondary | ICD-10-CM

## 2011-03-26 NOTE — Telephone Encounter (Signed)
Confirmed [facility] for 04/07/11 at 0815 .  Instructions and contact information given.  

## 2011-04-02 ENCOUNTER — Ambulatory Visit
Admission: RE | Admit: 2011-04-02 | Discharge: 2011-04-02 | Disposition: A | Discharge status: Home / Self Care | Payer: Medicare Other | Source: Ambulatory Visit | Attending: Internal Medicine | Admitting: Internal Medicine

## 2011-04-02 DIAGNOSIS — C50911 Malignant neoplasm of unspecified site of right female breast: Secondary | ICD-10-CM

## 2011-04-02 MED ORDER — GADOBENATE DIMEGLUMINE 529 MG/ML IV SOLN
14.0000 mL | Freq: Once | INTRAVENOUS | Status: AC | PRN
  Administered 2011-04-02: 14 mL via INTRAVENOUS

## 2011-04-05 ENCOUNTER — Telehealth: Payer: Self-pay | Admitting: *Deleted

## 2011-04-05 NOTE — Telephone Encounter (Signed)
Received call from pt stating she wanted to cancel her [facility] appt for 04/07/11.  She stated she needed to see her PCP prior to coming to the [facility] clinic.  Encourage pt to come to clinic and we will send her PCP all of the consult notes.  Pt declined to come again and stated she will call to r/e her appt.  Contact information given to pt to call to r/s her appt.

## 2011-04-07 ENCOUNTER — Ambulatory Visit: Payer: Medicare Other | Admitting: Oncology

## 2011-04-07 ENCOUNTER — Other Ambulatory Visit: Payer: Medicare Other | Admitting: Lab

## 2011-04-07 ENCOUNTER — Ambulatory Visit (INDEPENDENT_AMBULATORY_CARE_PROVIDER_SITE_OTHER): Payer: Medicare Other | Admitting: General Surgery

## 2011-04-07 ENCOUNTER — Ambulatory Visit: Payer: Medicare Other | Admitting: Physical Therapy

## 2011-04-07 ENCOUNTER — Ambulatory Visit: Payer: Medicare Other

## 2011-04-14 ENCOUNTER — Encounter: Payer: Self-pay | Admitting: *Deleted

## 2011-04-14 ENCOUNTER — Ambulatory Visit: Payer: Medicare Other | Attending: General Surgery | Admitting: Physical Therapy

## 2011-04-14 ENCOUNTER — Ambulatory Visit (HOSPITAL_BASED_OUTPATIENT_CLINIC_OR_DEPARTMENT_OTHER): Payer: Medicare Other | Admitting: General Surgery

## 2011-04-14 ENCOUNTER — Other Ambulatory Visit (HOSPITAL_BASED_OUTPATIENT_CLINIC_OR_DEPARTMENT_OTHER): Payer: Medicare Other

## 2011-04-14 ENCOUNTER — Encounter: Payer: Self-pay | Admitting: Specialist

## 2011-04-14 ENCOUNTER — Ambulatory Visit
Admission: RE | Admit: 2011-04-14 | Discharge: 2011-04-14 | Disposition: A | Discharge status: Home / Self Care | Payer: Medicare Other | Source: Ambulatory Visit | Attending: Radiation Oncology | Admitting: Radiation Oncology

## 2011-04-14 ENCOUNTER — Ambulatory Visit: Payer: Medicare Other

## 2011-04-14 ENCOUNTER — Ambulatory Visit (HOSPITAL_BASED_OUTPATIENT_CLINIC_OR_DEPARTMENT_OTHER): Payer: Medicare Other | Admitting: Oncology

## 2011-04-14 ENCOUNTER — Other Ambulatory Visit (INDEPENDENT_AMBULATORY_CARE_PROVIDER_SITE_OTHER): Payer: Self-pay | Admitting: General Surgery

## 2011-04-14 VITALS — BP 155/78 | HR 96 | Temp 97.8°F | Ht 64.5 in | Wt 163.5 lb

## 2011-04-14 DIAGNOSIS — C50119 Malignant neoplasm of central portion of unspecified female breast: Secondary | ICD-10-CM

## 2011-04-14 DIAGNOSIS — Z87891 Personal history of nicotine dependence: Secondary | ICD-10-CM | POA: Insufficient documentation

## 2011-04-14 DIAGNOSIS — H409 Unspecified glaucoma: Secondary | ICD-10-CM | POA: Insufficient documentation

## 2011-04-14 DIAGNOSIS — I10 Essential (primary) hypertension: Secondary | ICD-10-CM | POA: Insufficient documentation

## 2011-04-14 DIAGNOSIS — J45909 Unspecified asthma, uncomplicated: Secondary | ICD-10-CM | POA: Insufficient documentation

## 2011-04-14 DIAGNOSIS — Z79899 Other long term (current) drug therapy: Secondary | ICD-10-CM | POA: Insufficient documentation

## 2011-04-14 DIAGNOSIS — Z9181 History of falling: Secondary | ICD-10-CM | POA: Insufficient documentation

## 2011-04-14 DIAGNOSIS — M129 Arthropathy, unspecified: Secondary | ICD-10-CM | POA: Insufficient documentation

## 2011-04-14 DIAGNOSIS — M25619 Stiffness of unspecified shoulder, not elsewhere classified: Secondary | ICD-10-CM | POA: Insufficient documentation

## 2011-04-14 DIAGNOSIS — C50919 Malignant neoplasm of unspecified site of unspecified female breast: Secondary | ICD-10-CM

## 2011-04-14 DIAGNOSIS — R293 Abnormal posture: Secondary | ICD-10-CM | POA: Insufficient documentation

## 2011-04-14 DIAGNOSIS — Z01818 Encounter for other preprocedural examination: Secondary | ICD-10-CM | POA: Insufficient documentation

## 2011-04-14 DIAGNOSIS — C50419 Malignant neoplasm of upper-outer quadrant of unspecified female breast: Secondary | ICD-10-CM

## 2011-04-14 DIAGNOSIS — IMO0001 Reserved for inherently not codable concepts without codable children: Secondary | ICD-10-CM | POA: Insufficient documentation

## 2011-04-14 DIAGNOSIS — Z96649 Presence of unspecified artificial hip joint: Secondary | ICD-10-CM | POA: Insufficient documentation

## 2011-04-14 DIAGNOSIS — Z170 Estrogen receptor positive status [ER+]: Secondary | ICD-10-CM

## 2011-04-14 LAB — COMPREHENSIVE METABOLIC PANEL
ALT: 16 U/L (ref 0–35)
AST: 22 U/L (ref 0–37)
Albumin: 4 g/dL (ref 3.5–5.2)
Alkaline Phosphatase: 104 U/L (ref 39–117)
Glucose, Bld: 104 mg/dL — ABNORMAL HIGH (ref 70–99)
Potassium: 3.6 mEq/L (ref 3.5–5.3)
Sodium: 136 mEq/L (ref 135–145)
Total Bilirubin: 0.5 mg/dL (ref 0.3–1.2)
Total Protein: 7.5 g/dL (ref 6.0–8.3)

## 2011-04-14 LAB — CBC WITH DIFFERENTIAL/PLATELET
BASO%: 0.8 % (ref 0.0–2.0)
EOS%: 2.3 % (ref 0.0–7.0)
LYMPH%: 24.3 % (ref 14.0–49.7)
MCHC: 31.9 g/dL (ref 31.5–36.0)
MCV: 85.1 fL (ref 79.5–101.0)
MONO%: 3.8 % (ref 0.0–14.0)
NEUT#: 3.3 10*3/uL (ref 1.5–6.5)
Platelets: 226 10*3/uL (ref 145–400)
RBC: 4.02 10*6/uL (ref 3.70–5.45)
RDW: 14 % (ref 11.2–14.5)

## 2011-04-14 NOTE — Progress Notes (Signed)
Subjective:   New diagnosis right breast cancer  Patient ID: Natasha Fletcher, female   DOB: 03/21/1926, 78 y.o.   MRN: [id number]  HPI The patient is a very pleasant 78-year-old female seen in the multidisciplinary clinic for new diagnosis of cancer of the right breast. The patient recently presented for her annual screening. She had not been still does not notice any lump or other change in her breast such as pain, skin changes or nipple discharge. Mammogram revealed some architectural distortion in the central breast. Subsequent diagnostic mammogram and ultrasound revealed a 7 mm spiculated irregular mass in the central breast at the 12:00 position. Large core needle biopsy was recommended and performed. This has revealed invasive ductal carcinoma, low-grade with associated DCIS. It is strongly ER and PR positive with a low cast 67 of 8%. Subsequent bilateral breast MRI was performed which has revealed within the central portion of the right breast an enhancing mass measuring at the greatest one 0.6 cm. No other abnormalities identified in either breast. Patient is here to discuss initial treatment options. She has no previous history of breast disease or breast biopsies. There is no family history of breast cancer  Past Medical History  Diagnosis Date  . Hypertension   . Asthma   . Arthritis   . Glaucoma   . Incontinence   . Wears dentures   . Wears glasses   . Thyroid disease    Past Surgical History  Procedure Date  . Total hip arthroplasty   . Shoulder surgery   . Hand surgery   . Appendectomy    Current Outpatient Prescriptions  Medication Sig Dispense Refill  . albuterol (PROVENTIL HFA;VENTOLIN HFA) 108 (90 BASE) MCG/ACT inhaler Inhale 2 puffs into the lungs as needed.      . albuterol (PROVENTIL) (2.5 MG/3ML) 0.083% nebulizer solution Take 2.5 mg by nebulization every 6 (six) hours as needed.      . calcium carbonate 200 MG capsule Take 1,200 mg by mouth 2 (two) times daily with a  meal.      . Cholecalciferol (VITAMIN D-3 PO) Take 1 tablet by mouth daily. Pt. Not sure of dose      . fluticasone (FLOVENT HFA) 110 MCG/ACT inhaler Inhale 2 puffs into the lungs 2 (two) times daily.      . latanoprost (XALATAN) 0.005 % ophthalmic solution Place 1 drop into both eyes at bedtime.      . lisinopril-hydrochlorothiazide (PRINZIDE,ZESTORETIC) 20-25 MG per tablet Take 1 tablet by mouth daily.      . omeprazole (PRILOSEC) 20 MG capsule Take 20 mg by mouth daily as needed.      . oxybutynin (DITROPAN) 5 MG tablet Take 5 mg by mouth 3 (three) times daily as needed.      . traMADol (ULTRAM) 50 MG tablet Take 50 mg by mouth. One to two tabs three times a day prn      . vitamin B-12 (CYANOCOBALAMIN) 100 MCG tablet Take 50 mcg by mouth 2 (two) times daily. Pt. Not sure of dose.       Allergies  Allergen Reactions  . Sulfur    History  Substance Use Topics  . Smoking status: Current Everyday Smoker -- 0.5 packs/day  . Smokeless tobacco: Not on file  . Alcohol Use: No    Review of Systems  HENT: Negative.   Eyes: Positive for visual disturbance.  Respiratory: Negative.   Cardiovascular: Negative.   Gastrointestinal: Negative.   Genitourinary: Positive for urgency.         Incontinence  Musculoskeletal: Positive for myalgias, arthralgias and gait problem.       Objective:   Physical Exam BP 155/78  Pulse 96  Temp 97.8 F (36.6 C)  Resp 20  Ht 5' 4.5" (1.638 m)  Wt 164 lb (74.39 kg)  BMI 27.72 kg/m2 Body mass index is 27.72 kg/(m^2). General: Alert, well-developed elderly African American female who appears younger than her stated age, in no distress Skin: Warm and dry without rash or infection. HEENT: No palpable masses or thyromegaly. Sclera nonicteric. Pupils equal round and reactive. Oropharynx clear. Breasts: There is some slight bruising and thickening at the core biopsy site in the lateral right breast but I cannot feel any definite mass in either breast. No skin  dimpling or retraction. The normal. Lymph nodes: No cervical, , or inguinal nodes palpable.  There is a questionable mass versus palpable rib in the left supraclavicular fossa. Lungs: Breath sounds clear and equal without increased work of breathing Cardiovascular: Regular rate and rhythm without murmur. No JVD or edema. Peripheral pulses intact. Abdomen: Nondistended. Soft and nontender. No masses palpable. No organomegaly. No palpable hernias. Extremities: No edema or joint swelling or deformity. No chronic venous stasis changes. Neurologic: Alert and fully oriented. Gait normal.     Assessment:     New diagnosis of 1.6 cm low-grade invasive carcinoma of the central right breast. We discussed initial surgical options including lumpectomy and mastectomy. She would prefer lumpectomy. She understands that radiation may well be required post lumpectomy and that it would not be required post mastectomy. We discussed the nature of the procedure including risks of bleeding, infection, anesthetic risks, and possible need for further surgery based on final pathology. At her age I would not plan a sentinel lymph node biopsy as she will not be a candidate for chemotherapy. 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    Plan:     Needle localized right breast lumpectomy as an outpatient under general anesthesia.      

## 2011-04-14 NOTE — Progress Notes (Signed)
Met pt at multi-disciplinary clinic.  Pt had come to appt alone.  Described herself as independent, not afraid.  Gave her information about support services, breast cancer support group, and Reach to Recovery.  Pt requested a Careers information officer.  TMP

## 2011-04-14 NOTE — Patient Instructions (Signed)
Lumpectomy, Breast Conserving Surgery A lumpectomy is breast surgery that removes only part of the breast. Another name used may be partial mastectomy. The amount removed varies. Make sure you understand how much of your breast will be removed. Reasons for a lumpectomy:  Any solid breast mass.   Grouped significant nodularity that may be confused with a solitary breast mass.  Lumpectomy is the most common form of breast cancer surgery today. The surgeon removes the portion of your breast which contains the tumor (cancer). This is the lump. Some normal tissue around the lump is also removed to be sure that all the tumor has been removed.  If cancer cells are found in the margins where the breast tissue was removed, your surgeon will do more surgery to remove the remaining cancer tissue. This is called re-excision surgery. Radiation and/or chemotherapy treatments are often given following a lumpectomy to kill any cancer cells that could possibly remain.  REASONS YOU MAY NOT BE ABLE TO HAVE BREAST CONSERVING SURGERY:  The tumor is located in more than one place.   Your breast is small and the tumor is large so the breast would be disfigured.   The entire tumor removal is not successful with a lumpectomy.   You cannot commit to a full course of chemotherapy, radiation therapy or are pregnant and cannot have radiation.   You have previously had radiation to the breast to treat cancer.  HOW A LUMPECTOMY IS PERFORMED If overnight nursing is not required following a biopsy, a lumpectomy can be performed as a same-day surgery. This can be done in a hospital, clinic, or surgical center. The anesthesia used will depend on your surgeon. They will discuss this with you. A general anesthetic keeps you sleeping through the procedure. LET YOUR CAREGIVERS KNOW ABOUT THE FOLLOWING:  Allergies   Medications taken including herbs, eye drops, over the counter medications, and creams.   Use of steroids (by  mouth or creams)   Previous problems with anesthetics or Novocaine.   Possibility of pregnancy, if this applies   History of blood clots (thrombophlebitis)   History of bleeding or blood problems.   Previous surgery   Other health problems  BEFORE THE PROCEDURE You should be present one hour prior to your procedure unless directed otherwise.  AFTER THE PROCEDURE  After surgery, you will be taken to the recovery area where a nurse will watch and check your progress. Once you're awake, stable, and taking fluids well, barring other problems you will be allowed to go home.   Ice packs applied to your operative site may help with discomfort and keep the swelling down.   A small rubber drain may be placed in the breast for a couple of days to prevent a hematoma from developing in the breast.   A pressure dressing may be applied for 24 to 48 hours to prevent bleeding.   Keep the wound dry.   You may resume a normal diet and activities as directed. Avoid strenuous activities affecting the arm on the side of the biopsy site such as tennis, swimming, heavy lifting (more than 10 pounds) or pulling.   Bruising in the breast is normal following this procedure.   Wearing a bra - even to bed - may be more comfortable and also help keep the dressing on.   Change dressings as directed.   Only take over-the-counter or prescription medicines for pain, discomfort, or fever as directed by your caregiver.  Call for your results as   instructed by your surgeon. Remember it is your responsibility to get the results of your lumpectomy if your surgeon asked you to follow-up. Do not assume everything is fine if you have not heard from your caregiver. SEEK MEDICAL CARE IF:   There is increased bleeding (more than a small spot) from the wound.   You notice redness, swelling, or increasing pain in the wound.   Pus is coming from wound.   An unexplained oral temperature above 102 F (38.9 C) develops.     You notice a foul smell coming from the wound or dressing.  SEEK IMMEDIATE MEDICAL CARE IF:   You develop a rash.   You have difficulty breathing.   You have any allergic problems.  Document Released: 05/03/2006 Document Revised: 12/02/2010 Document Reviewed: 08/04/2006 Exit[employer] Patient Information 2012 [employer]. 

## 2011-04-15 ENCOUNTER — Other Ambulatory Visit (INDEPENDENT_AMBULATORY_CARE_PROVIDER_SITE_OTHER): Payer: Self-pay | Admitting: General Surgery

## 2011-04-15 DIAGNOSIS — C50919 Malignant neoplasm of unspecified site of unspecified female breast: Secondary | ICD-10-CM

## 2011-04-15 NOTE — Progress Notes (Signed)
Patient History and Physical   Natasha Fletcher [id number] [birth year] 78 y.o. 04/15/2011  CC: Dr Johna Sheriff; dr Michell Heinrich  Chief Complaint: Breast cancer  HPI:  78 yo woman from [place] who underwent screening mammography which revealed architectural distortion in the central breast. A biopsy performed 12/19 showed low grade ductal ca , er and pr @ 100%, ki67 @ 8% , her2 -ve.  A MRI of the breast revealed   A 9x16x 13mm mass  With no other abnormailities. PMH: Past Medical History  Diagnosis Date  . Hypertension   . Asthma   . Arthritis   . Glaucoma   . Incontinence   . Wears dentures   . Wears glasses   . Thyroid disease     Past Surgical History  Procedure Date  . Total hip arthroplasty x 2 2007   . Shoulder surgery   . Hand surgery   . Appendectomy     Allergies: Allergies  Allergen Reactions  . Sulfur     Medications: Per med list      Social History:   reports that she has been smoking 1/2 ppd., quit 30 y ago..  She does not have any smokeless tobacco history on file. She reports that she does not drink alcohol or use illicit drugs.She is single, originally from [place]. Attended [employer]. 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    Family History: No family history on file.no hx of breast or ovarian cancer in the family  Reproductive History G0P0 Menarche 17 No hx of hrt   Review of Systems: Constitutional ROS: Fever no, Chills, Night Sweats, Anorexia, Pain no Cardiovascular ROS: no chest pain or dyspnea on exertion Respiratory ROS: no cough, shortness of breath, or wheezing Neurological ROS: negative Dermatological ROS: negative ENT ROS: negative Gastrointestinal ROS: no abdominal pain, change in bowel habits, or black or bloody stools Genito-Urinary ROS: no dysuria, trouble voiding, or hematuria Hematological and Lymphatic ROS: negative Breast ROS: negative for breast lumps Musculoskeletal ROS: negative Remaining ROS  negative.  Physical Exam: Blood pressure 155/78, pulse 96, temperature 97.8 F (36.6 C), height 5' 4.5" (1.638 m), weight 163 lb 8 oz (74.163 kg). General appearance: alert, cooperative and appears stated age Head: Normocephalic, without obvious abnormality, atraumatic Neck: no adenopathy, no carotid bruit, no JVD, supple, symmetrical, trachea midline and thyroid not enlarged, symmetric, no tenderness/mass/nodules Lymph nodes: Cervical, supraclavicular, and axillary nodes normal. lt supraclavicular rib c/w cervical rib Heart exam - S1, S2 normal, no murmur, no gallop, rate regular regular rate and rhythm, S1, S2 normal, no murmur, click, rub or gallop abdomen is soft without significant tenderness, masses, organomegaly or guarding normal appearance, no masses or tenderness extremities normal, atraumatic, no cyanosis or edema Grossly normal Breasts: ecchymoses lateral rt breast, no mass in either breast Lab Results: Lab Results  Component Value Date   WBC 4.9 04/14/2011   HGB 10.9* 04/14/2011   HCT 34.3* 04/14/2011   MCV 85.1 04/14/2011   PLT 226 04/14/2011     Chemistry      Component Value Date/Time   NA 136 04/14/2011 0829   K 3.6 04/14/2011 0829   CL 100 04/14/2011 0829   CO2 27 04/14/2011 0829   BUN 20 04/14/2011 0829   CREATININE 1.07 04/14/2011 0829      Component Value Date/Time   CALCIUM 10.4 04/14/2011 0829   ALKPHOS 104 04/14/2011 0829   AST 22 04/14/2011 0829   ALT 16 04/14/2011 0829   BILITOT  0.5 04/14/2011 0981       Radiological Studies: n/a  Impression and Plan: 78 yo er+ breast mass, she is a good candidate for lumpectomy and likely hormonal therapy. I discussed this with her and will plan to see her after surgery. Dr Michell Heinrich discussed the possibility of radiation with her.  45 minutes spent with the pt, 1/2 that time devoted to pt related counselling.  Pierce Crane, MD 04/15/2011, 12:09 AM

## 2011-04-15 NOTE — Progress Notes (Signed)
CC:   Antony Madura, M.D. Pierce Crane, M.D., F.R.C.P.C. Lorne Skeens. Hoxworth, M.D.  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  PREVIOUS INTERVENTION:  Biopsy of right breast mass on 03/24/2011 revealing invasive ductal carcinoma.  HISTORY OF PRESENT ILLNESS:  Ms. Elliston is a pleasant 78 year old female who underwent a screening mammogram for a right breast mass.  This measured 7 x 8 x 7 mm.  Biopsy showed grade 1 invasive ductal carcinoma that was ER/PR positive at 100%, HER2 negative, Ki-67 8%.  MRI confirmed a 0.9 x 1.6 x 1.3 cm mass.  She reports some swelling in the right breast after biopsy, but no other complaints.  She presents today as a referral from Dr. Johna Sheriff for treatment regarding her newly diagnosed right breast cancer.  PAST MEDICAL HISTORY:  Hypertension, arthritis, and asthma as well as an overactive bladder.  She is also status post bilateral hip replacement and status post shoulder surgery, status post hand surgery, and status post appendectomy.  Also in her past medical history, she does have glaucoma.  MEDICATIONS:  Flovent, albuterol, Lisinopril/hydrochlorothiazide, omeprazole, oxybutynin, tramadol, eyedrops, as well as calcium, vitamin D3, and vitamin B12.  FAMILY HISTORY:  She has no family history of malignancy.  She has 7 brothers and sisters.  SOCIAL HISTORY:  She admits a distant history of smoking.  She is retired and served in [employer].  She has never been married and has no children.  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  GYN HISTORY:  Menses at 17.  She is postmenopausal.  She never used hormone replacement.  REVIEW OF SYSTEMS:  Positive for weight loss, insomnia, knee pain, glaucoma, muscle aches, urinary incontinence, and arthritis as well as a history of a thyroid problem managed by her primary care physician.  ALLERGIES:  Sulfa.  PHYSICAL  EXAMINATION:  General:  She is a pleasant female in no distress, sitting comfortably on the exam room table.  She appears younger than her stated age.  Lymphatic:  She has no palpable cervical or supraclavicular adenopathy.  She has no palpable axillary adenopathy bilaterally.  Breasts:  She has ptotic breasts bilaterally.  No palpable abnormalities of the left breast.  On the lateral aspect of the right breast, there is an approximately 3 cm palpable biopsy change.  There is some bruising in the periareolar region.  No other palpable abnormalities of the right breast.  Heart:  Regular rate and rhythm. Lungs:  Clear to auscultation bilaterally.  IMPRESSION:  78 year old female with newly diagnosed right low-grade invasive ductal carcinoma.  RECOMMENDATIONS:  I spoke with Ms. Oertel today regarding her diagnosis and options for treatment.  I spoke with her for 1 hour.  Greater than 50% of that time was spent in counseling and coordination of care.  We discussed the results of randomized trials comparing mastectomy to breast conservation.  We discussed the equivalency in terms of survival between the two.  We discussed the role of radiation in decreasing local recurrence in patients who undergo lumpectomy.  We discussed the results of randomized trials comparing lumpectomy plus antiestrogen therapy versus lumpectomy plus radiation, plus antiestrogen therapy.  We discussed the process of simulation and the placement of tattoos.  We discussed the possible side effects of treatment including but not limited to skin irritation, darkening, and fatigue.  She is going to meet with Dr. Johna Sheriff and Dr. Donnie Coffin.  I think she is leaning towards breast conservation  as it will facilitate a lower recovery time period. She is interested in maintaining her 3 times a week water aerobics schedule which is very important to her.  She will meet with our breast cancer navigator at the end of this consultation  as well as meet with our physical therapist, as well as our patient and family support team.    ______________________________ Lurline Hare, M.D. SW/MEDQ  D:  04/14/2011  T:  04/15/2011  Job:  [id number]

## 2011-04-20 ENCOUNTER — Encounter: Payer: Self-pay | Admitting: *Deleted

## 2011-04-20 ENCOUNTER — Telehealth: Payer: Self-pay | Admitting: *Deleted

## 2011-04-20 NOTE — Telephone Encounter (Signed)
Spoke to pt concerning [facility]C from 04/14/11.  Pt denies questions or concerns at this time.  Pt relate she understands dx and treatment care plan.  Encourage pt to call with needs.  Received verbal understanding.  Contact information given.

## 2011-04-20 NOTE — Progress Notes (Signed)
Mailed after appt letter to pt. 

## 2011-04-21 ENCOUNTER — Encounter (HOSPITAL_BASED_OUTPATIENT_CLINIC_OR_DEPARTMENT_OTHER): Payer: Self-pay | Admitting: *Deleted

## 2011-04-21 NOTE — Progress Notes (Signed)
To come in for cxr,ekg,lab Very bright for her age-drives-cares for self-good historian To come dos with sister who lives with her

## 2011-04-26 ENCOUNTER — Ambulatory Visit
Admission: RE | Admit: 2011-04-26 | Discharge: 2011-04-26 | Disposition: A | Discharge status: Home / Self Care | Payer: Medicare Other | Source: Ambulatory Visit | Attending: Anesthesiology | Admitting: Anesthesiology

## 2011-04-26 ENCOUNTER — Other Ambulatory Visit: Payer: Self-pay

## 2011-04-26 ENCOUNTER — Encounter (HOSPITAL_BASED_OUTPATIENT_CLINIC_OR_DEPARTMENT_OTHER)
Admission: RE | Admit: 2011-04-26 | Discharge: 2011-04-26 | Disposition: A | Discharge status: Home / Self Care | Payer: Medicare Other | Source: Ambulatory Visit | Attending: General Surgery | Admitting: General Surgery

## 2011-04-26 LAB — BASIC METABOLIC PANEL
BUN: 16 mg/dL (ref 6–23)
CO2: 25 mEq/L (ref 19–32)
Chloride: 104 mEq/L (ref 96–112)
Creatinine, Ser: 0.93 mg/dL (ref 0.50–1.10)
Glucose, Bld: 87 mg/dL (ref 70–99)

## 2011-04-27 ENCOUNTER — Ambulatory Visit (HOSPITAL_BASED_OUTPATIENT_CLINIC_OR_DEPARTMENT_OTHER)
Admission: RE | Admit: 2011-04-27 | Discharge: 2011-04-27 | Disposition: A | Discharge status: Home / Self Care | Payer: Medicare Other | Source: Ambulatory Visit | Attending: General Surgery | Admitting: General Surgery

## 2011-04-27 ENCOUNTER — Encounter (HOSPITAL_BASED_OUTPATIENT_CLINIC_OR_DEPARTMENT_OTHER): Payer: Self-pay | Admitting: *Deleted

## 2011-04-27 ENCOUNTER — Ambulatory Visit
Admission: RE | Admit: 2011-04-27 | Discharge: 2011-04-27 | Disposition: A | Discharge status: Home / Self Care | Payer: Medicare Other | Source: Ambulatory Visit | Attending: General Surgery | Admitting: General Surgery

## 2011-04-27 ENCOUNTER — Other Ambulatory Visit (INDEPENDENT_AMBULATORY_CARE_PROVIDER_SITE_OTHER): Payer: Self-pay | Admitting: General Surgery

## 2011-04-27 ENCOUNTER — Encounter (HOSPITAL_BASED_OUTPATIENT_CLINIC_OR_DEPARTMENT_OTHER)
Admission: RE | Disposition: A | Discharge status: Home / Self Care | Payer: Self-pay | Source: Ambulatory Visit | Attending: General Surgery

## 2011-04-27 ENCOUNTER — Ambulatory Visit (HOSPITAL_BASED_OUTPATIENT_CLINIC_OR_DEPARTMENT_OTHER): Payer: Medicare Other | Admitting: *Deleted

## 2011-04-27 DIAGNOSIS — C50919 Malignant neoplasm of unspecified site of unspecified female breast: Secondary | ICD-10-CM | POA: Insufficient documentation

## 2011-04-27 DIAGNOSIS — Z01810 Encounter for preprocedural cardiovascular examination: Secondary | ICD-10-CM | POA: Insufficient documentation

## 2011-04-27 DIAGNOSIS — D0590 Unspecified type of carcinoma in situ of unspecified breast: Secondary | ICD-10-CM | POA: Insufficient documentation

## 2011-04-27 DIAGNOSIS — K08109 Complete loss of teeth, unspecified cause, unspecified class: Secondary | ICD-10-CM | POA: Insufficient documentation

## 2011-04-27 DIAGNOSIS — J45909 Unspecified asthma, uncomplicated: Secondary | ICD-10-CM | POA: Insufficient documentation

## 2011-04-27 DIAGNOSIS — Z170 Estrogen receptor positive status [ER+]: Secondary | ICD-10-CM | POA: Insufficient documentation

## 2011-04-27 DIAGNOSIS — K219 Gastro-esophageal reflux disease without esophagitis: Secondary | ICD-10-CM | POA: Insufficient documentation

## 2011-04-27 DIAGNOSIS — I10 Essential (primary) hypertension: Secondary | ICD-10-CM | POA: Insufficient documentation

## 2011-04-27 HISTORY — PX: BREAST BIOPSY: SHX20

## 2011-04-27 HISTORY — DX: Gastro-esophageal reflux disease without esophagitis: K21.9

## 2011-04-27 HISTORY — PX: BREAST LUMPECTOMY: SHX2

## 2011-04-27 SURGERY — BREAST BIOPSY WITH NEEDLE LOCALIZATION
Anesthesia: General | Site: Breast | Laterality: Right | Wound class: Clean

## 2011-04-27 MED ORDER — BUPIVACAINE-EPINEPHRINE 0.25% -1:200000 IJ SOLN
INTRAMUSCULAR | Status: DC | PRN
  Administered 2011-04-27: 19 mL

## 2011-04-27 MED ORDER — FENTANYL CITRATE 0.05 MG/ML IJ SOLN
INTRAMUSCULAR | Status: DC | PRN
  Administered 2011-04-27: 25 ug via INTRAVENOUS
  Administered 2011-04-27: 50 ug via INTRAVENOUS
  Administered 2011-04-27: 25 ug via INTRAVENOUS

## 2011-04-27 MED ORDER — DEXAMETHASONE SODIUM PHOSPHATE 4 MG/ML IJ SOLN
INTRAMUSCULAR | Status: DC | PRN
  Administered 2011-04-27: 4 mg via INTRAVENOUS

## 2011-04-27 MED ORDER — HYDROMORPHONE HCL PF 1 MG/ML IJ SOLN
0.2500 mg | INTRAMUSCULAR | Status: DC | PRN
Start: 2011-04-27 — End: 2011-04-27
  Administered 2011-04-27 (×4): 0.5 mg via INTRAVENOUS

## 2011-04-27 MED ORDER — CEFAZOLIN SODIUM 1-5 GM-% IV SOLN
1.0000 g | INTRAVENOUS | Status: AC
  Administered 2011-04-27: 1 g via INTRAVENOUS

## 2011-04-27 MED ORDER — LACTATED RINGERS IV SOLN
INTRAVENOUS | Status: DC
  Administered 2011-04-27 (×2): via INTRAVENOUS

## 2011-04-27 MED ORDER — HYDROCODONE-ACETAMINOPHEN 5-325 MG PO TABS: 1.0000 | ORAL_TABLET | ORAL | Status: DC | PRN

## 2011-04-27 MED ORDER — ONDANSETRON HCL 4 MG/2ML IJ SOLN
INTRAMUSCULAR | Status: DC | PRN
  Administered 2011-04-27: 4 mg via INTRAVENOUS

## 2011-04-27 MED ORDER — PROMETHAZINE HCL 25 MG/ML IJ SOLN: 6.2500 mg | INTRAMUSCULAR | Status: DC | PRN

## 2011-04-27 MED ORDER — PROPOFOL 10 MG/ML IV EMUL
INTRAVENOUS | Status: DC | PRN
  Administered 2011-04-27: 20 mg via INTRAVENOUS
  Administered 2011-04-27: 125 mg via INTRAVENOUS

## 2011-04-27 MED ORDER — LIDOCAINE HCL (CARDIAC) 20 MG/ML IV SOLN
INTRAVENOUS | Status: DC | PRN
  Administered 2011-04-27: 60 mg via INTRAVENOUS

## 2011-04-27 MED ORDER — OXYCODONE-ACETAMINOPHEN 5-325 MG PO TABS
1.0000 | ORAL_TABLET | ORAL | Status: AC | PRN
  Administered 2011-04-27: 1 via ORAL

## 2011-04-27 MED ORDER — MEPERIDINE HCL 25 MG/ML IJ SOLN: 6.2500 mg | INTRAMUSCULAR | Status: DC | PRN

## 2011-04-27 SURGICAL SUPPLY — 48 items
BLADE SURG 10 STRL SS (BLADE) IMPLANT
BLADE SURG 15 STRL LF DISP TIS (BLADE) ×2 IMPLANT
BLADE SURG 15 STRL SS (BLADE) ×2
CANISTER SUCTION 1200CC (MISCELLANEOUS) ×2 IMPLANT
CHLORAPREP W/TINT 26ML (MISCELLANEOUS) ×2 IMPLANT
CLIP TI MEDIUM 6 (CLIP) IMPLANT
CLIP TI WIDE RED SMALL 6 (CLIP) ×2 IMPLANT
CLOTH BEACON ORANGE TIMEOUT ST (SAFETY) ×2 IMPLANT
COVER MAYO STAND STRL (DRAPES) ×2 IMPLANT
COVER TABLE BACK 60X90 (DRAPES) ×2 IMPLANT
DERMABOND ADVANCED (GAUZE/BANDAGES/DRESSINGS) ×2
DERMABOND ADVANCED .7 DNX12 (GAUZE/BANDAGES/DRESSINGS) ×2 IMPLANT
DEVICE DUBIN W/COMP PLATE 8390 (MISCELLANEOUS) ×2 IMPLANT
DRAPE PED LAPAROTOMY (DRAPES) ×2 IMPLANT
DRAPE UTILITY XL STRL (DRAPES) ×2 IMPLANT
ELECT COATED BLADE 2.86 ST (ELECTRODE) ×2 IMPLANT
ELECT REM PT RETURN 9FT ADLT (ELECTROSURGICAL) ×2
ELECTRODE REM PT RTRN 9FT ADLT (ELECTROSURGICAL) ×1 IMPLANT
GAUZE SPONGE 4X4 16PLY XRAY LF (GAUZE/BANDAGES/DRESSINGS) ×2 IMPLANT
GLOVE BIO SURGEON STRL SZ7 (GLOVE) ×2 IMPLANT
GLOVE BIOGEL PI IND STRL 7.0 (GLOVE) ×3 IMPLANT
GLOVE BIOGEL PI IND STRL 8 (GLOVE) ×1 IMPLANT
GLOVE BIOGEL PI INDICATOR 7.0 (GLOVE) ×3
GLOVE BIOGEL PI INDICATOR 8 (GLOVE) ×1
GLOVE SS BIOGEL STRL SZ 7.5 (GLOVE) ×1 IMPLANT
GLOVE SUPERSENSE BIOGEL SZ 7.5 (GLOVE) ×1
GOWN PREVENTION PLUS XLARGE (GOWN DISPOSABLE) ×8 IMPLANT
GOWN PREVENTION PLUS XXLARGE (GOWN DISPOSABLE) IMPLANT
NEEDLE HYPO 25X1 1.5 SAFETY (NEEDLE) ×2 IMPLANT
NS IRRIG 1000ML POUR BTL (IV SOLUTION) ×2 IMPLANT
PACK BASIN DAY SURGERY FS (CUSTOM PROCEDURE TRAY) ×2 IMPLANT
PENCIL BUTTON HOLSTER BLD 10FT (ELECTRODE) ×2 IMPLANT
SLEEVE SCD COMPRESS KNEE MED (MISCELLANEOUS) ×2 IMPLANT
STAPLER VISISTAT 35W (STAPLE) IMPLANT
SUT MON AB 3-0 SH 27 (SUTURE) ×1
SUT MON AB 3-0 SH27 (SUTURE) ×1 IMPLANT
SUT MON AB 5-0 PS2 18 (SUTURE) ×2 IMPLANT
SUT SILK 3 0 SH 30 (SUTURE) ×2 IMPLANT
SUT VIC AB 3-0 SH 27 (SUTURE) ×1
SUT VIC AB 3-0 SH 27X BRD (SUTURE) ×1 IMPLANT
SUT VIC AB 4-0 BRD 54 (SUTURE) IMPLANT
SYR BULB 3OZ (MISCELLANEOUS) ×2 IMPLANT
SYR CONTROL 10ML LL (SYRINGE) ×2 IMPLANT
TOWEL OR 17X24 6PK STRL BLUE (TOWEL DISPOSABLE) ×4 IMPLANT
TOWEL OR NON WOVEN STRL DISP B (DISPOSABLE) ×2 IMPLANT
TUBE CONNECTING 20X1/4 (TUBING) IMPLANT
WATER STERILE IRR 1000ML POUR (IV SOLUTION) ×2 IMPLANT
YANKAUER SUCT BULB TIP NO VENT (SUCTIONS) ×2 IMPLANT

## 2011-04-27 NOTE — H&P (View-Only) (Signed)
Subjective:   New diagnosis right breast cancer  Patient ID: Natasha Fletcher, female   DOB: 1925/12/26, 78 y.o.   MRN: [id number]  HPI The patient is a very pleasant 78 year old female seen in the multidisciplinary clinic for new diagnosis of cancer of the right breast. The patient recently presented for her annual screening. She had not been still does not notice any lump or other change in her breast such as pain, skin changes or nipple discharge. Mammogram revealed some architectural distortion in the central breast. Subsequent diagnostic mammogram and ultrasound revealed a 7 mm spiculated irregular mass in the central breast at the 12:00 position. Large core needle biopsy was recommended and performed. This has revealed invasive ductal carcinoma, low-grade with associated DCIS. It is strongly ER and PR positive with a low cast 67 of 8%. Subsequent bilateral breast MRI was performed which has revealed within the central portion of the right breast an enhancing mass measuring at the greatest one 0.6 cm. No other abnormalities identified in either breast. Patient is here to discuss initial treatment options. She has no previous history of breast disease or breast biopsies. There is no family history of breast cancer  Past Medical History  Diagnosis Date  . Hypertension   . Asthma   . Arthritis   . Glaucoma   . Incontinence   . Wears dentures   . Wears glasses   . Thyroid disease    Past Surgical History  Procedure Date  . Total hip arthroplasty   . Shoulder surgery   . Hand surgery   . Appendectomy    Current Outpatient Prescriptions  Medication Sig Dispense Refill  . albuterol (PROVENTIL HFA;VENTOLIN HFA) 108 (90 BASE) MCG/ACT inhaler Inhale 2 puffs into the lungs as needed.      Marland Kitchen albuterol (PROVENTIL) (2.5 MG/3ML) 0.083% nebulizer solution Take 2.5 mg by nebulization every 6 (six) hours as needed.      . calcium carbonate 200 MG capsule Take 1,200 mg by mouth 2 (two) times daily with a  meal.      . Cholecalciferol (VITAMIN D-3 PO) Take 1 tablet by mouth daily. Pt. Not sure of dose      . fluticasone (FLOVENT HFA) 110 MCG/ACT inhaler Inhale 2 puffs into the lungs 2 (two) times daily.      Marland Kitchen latanoprost (XALATAN) 0.005 % ophthalmic solution Place 1 drop into both eyes at bedtime.      Marland Kitchen lisinopril-hydrochlorothiazide (PRINZIDE,ZESTORETIC) 20-25 MG per tablet Take 1 tablet by mouth daily.      Marland Kitchen omeprazole (PRILOSEC) 20 MG capsule Take 20 mg by mouth daily as needed.      Marland Kitchen oxybutynin (DITROPAN) 5 MG tablet Take 5 mg by mouth 3 (three) times daily as needed.      . traMADol (ULTRAM) 50 MG tablet Take 50 mg by mouth. One to two tabs three times a day prn      . vitamin B-12 (CYANOCOBALAMIN) 100 MCG tablet Take 50 mcg by mouth 2 (two) times daily. Pt. Not sure of dose.       Allergies  Allergen Reactions  . Sulfur    History  Substance Use Topics  . Smoking status: Current Everyday Smoker -- 0.5 packs/day  . Smokeless tobacco: Not on file  . Alcohol Use: No    Review of Systems  HENT: Negative.   Eyes: Positive for visual disturbance.  Respiratory: Negative.   Cardiovascular: Negative.   Gastrointestinal: Negative.   Genitourinary: Positive for urgency.  Incontinence  Musculoskeletal: Positive for myalgias, arthralgias and gait problem.       Objective:   Physical Exam BP 155/78  Pulse 96  Temp 97.8 F (36.6 C)  Resp 20  Ht 5' 4.5" (1.638 m)  Wt 164 lb (74.39 kg)  BMI 27.72 kg/m2 Body mass index is 27.72 kg/(m^2). General: Alert, well-developed elderly African American female who appears younger than her stated age, in no distress Skin: Warm and dry without rash or infection. HEENT: No palpable masses or thyromegaly. Sclera nonicteric. Pupils equal round and reactive. Oropharynx clear. Breasts: There is some slight bruising and thickening at the core biopsy site in the lateral right breast but I cannot feel any definite mass in either breast. No skin  dimpling or retraction. The normal. Lymph nodes: No cervical, , or inguinal nodes palpable.  There is a questionable mass versus palpable rib in the left supraclavicular fossa. Lungs: Breath sounds clear and equal without increased work of breathing Cardiovascular: Regular rate and rhythm without murmur. No JVD or edema. Peripheral pulses intact. Abdomen: Nondistended. Soft and nontender. No masses palpable. No organomegaly. No palpable hernias. Extremities: No edema or joint swelling or deformity. No chronic venous stasis changes. Neurologic: Alert and fully oriented. Gait normal.     Assessment:     New diagnosis of 1.6 cm low-grade invasive carcinoma of the central right breast. We discussed initial surgical options including lumpectomy and mastectomy. She would prefer lumpectomy. She understands that radiation may well be required post lumpectomy and that it would not be required post mastectomy. We discussed the nature of the procedure including risks of bleeding, infection, anesthetic risks, and possible need for further surgery based on final pathology. At her age I would not plan a sentinel lymph node biopsy as she will not be a candidate for chemotherapy. 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    Plan:     Needle localized right breast lumpectomy as an outpatient under general anesthesia.

## 2011-04-27 NOTE — Progress Notes (Signed)
Attempted to get temp orally x 2 and axillary x2 unsuccessfully. Pt put teeth in mouth

## 2011-04-27 NOTE — Anesthesia Preprocedure Evaluation (Addendum)
Anesthesia Evaluation  Patient identified by MRN, date of birth, ID band Patient awake    Reviewed: Allergy & Precautions, H&P , NPO status , Patient's Chart, lab work & pertinent test results  History of Anesthesia Complications Negative for: history of anesthetic complications  Airway Mallampati: II  Neck ROM: Full  Mouth opening: Limited Mouth Opening  Dental  (+) Edentulous Upper and Edentulous Lower   Pulmonary asthma ,  clear to auscultation        Cardiovascular hypertension, Regular Normal    Neuro/Psych    GI/Hepatic GERD-  ,  Endo/Other    Renal/GU      Musculoskeletal   Abdominal   Peds  Hematology   Anesthesia Other Findings   Reproductive/Obstetrics                          Anesthesia Physical Anesthesia Plan  ASA: II  Anesthesia Plan: General   Post-op Pain Management:    Induction: Intravenous  Airway Management Planned: LMA  Additional Equipment:   Intra-op Plan:   Post-operative Plan:   Informed Consent: I have reviewed the patients History and Physical, chart, labs and discussed the procedure including the risks, benefits and alternatives for the proposed anesthesia with the patient or authorized representative who has indicated his/her understanding and acceptance.   Dental advisory given  Plan Discussed with: CRNA and Surgeon  Anesthesia Plan Comments:         Anesthesia Quick Evaluation

## 2011-04-27 NOTE — Anesthesia Procedure Notes (Signed)
Procedure Name: LMA Insertion Date/Time: 04/27/2011 12:05 PM Performed by: Meyer Russel Pre-anesthesia Checklist: Patient identified, Emergency Drugs available, Suction available, Patient being monitored and Timeout performed Patient Re-evaluated:Patient Re-evaluated prior to inductionOxygen Delivery Method: Circle System Utilized Preoxygenation: Pre-oxygenation with 100% oxygen Intubation Type: IV induction Ventilation: Mask ventilation without difficulty LMA: LMA inserted LMA Size: 4.0 Number of attempts: 1 Airway Equipment and Method: bite block Placement Confirmation: positive ETCO2 and breath sounds checked- equal and bilateral Tube secured with: Tape Dental Injury: Teeth and Oropharynx as per pre-operative assessment

## 2011-04-27 NOTE — Op Note (Signed)
Preoperative Diagnosis: right breast cancer   Postoprative Diagnosis: right breast cancer   Procedure: Procedure(s): BREAST BIOPSY WITH NEEDLE LOCALIZATION   Surgeon: Glenna Fellows T   Assistants: None   Anesthesia:  General LMA anesthesiaDiagnos  Indications:  Patient is an 78 year old female who on a recent screening mammogram had a small area of architectural distortion found in the central right breast. A large core needle biopsy has revealed a low-grade invasive carcinoma. MRI has confirmed a sub 1 cm lesion.we have discussed initial surgical treatment options including mastectomy and lumpectomy and she desires lumpectomy. We have discussed the likely need for radiation, risks of bleeding, infection, anesthetic risks, and possible need for further surgery based on final pathology.   Procedure Detail: Patient is brought to the operating room, placed in the supine position and laryngeal mask general anesthesia was induced. She received preoperative IV antibiotics. Patient timeout was performed and correct procedure verified. The right breast was widely sterilely prepped and draped. I made a curvilinear incision at the lateral areolar border and dissection was carried down into the subcutaneous tissue. The localizing wire was brought into the incision. I then excised a several centimeter specimen of breast tissue around the shaft and tip of the wire with cautery. The specimen was oriented with 8 and specimen mammogram showed the wire and a clip centrally located in the specimen. There was some firm tissue lateral to the lumpectomy cavity which I suspect was the previous core biopsy tract but I did excise a further lateral margin out to the subcutaneous tissue of the lateral breast and this was oriented and sent for permanent pathology. Hemostasis was obtained with a couple of suture ligatures and 3-0 Vicryl and cautery. The soft tissue was infiltrated with Marcaine with epinephrine. The  wound was irrigated and complete hemostasis assured. The deep breast tissue was closed with interrupted 3-0 Vicryl, the subcutaneous with interrupted 3-0 Vicryl, and the skin with subcuticular 4-0 Monocryl and Dermabond. Sponge needle and instrument counts were correct.   Estimated Blood Loss:  less than 50 mL         Drains: none  Blood Given: none          Specimens: #1 right breast tissue #2 further lateral margin        Complications:  * No complications entered in OR log *         Disposition: PACU - hemodynamically stable.         Condition: stable  Mariella Saa MD, FACS  04/27/2011, 1:07 PM

## 2011-04-27 NOTE — Anesthesia Postprocedure Evaluation (Signed)
  Anesthesia Post-op Note  Patient: Natasha Fletcher  Procedure(s) Performed:  BREAST BIOPSY WITH NEEDLE LOCALIZATION - right needle localized breast lumpectomy   Patient Location: PACU  Anesthesia Type: General  Level of Consciousness: awake  Airway and Oxygen Therapy: Patient Spontanous Breathing  Post-op Pain: mild  Post-op Assessment: Post-op Vital signs reviewed  Post-op Vital Signs: stable  Complications: No apparent anesthesia complications

## 2011-04-27 NOTE — Transfer of Care (Signed)
Immediate Anesthesia Transfer of Care Note  Patient: Natasha Fletcher  Procedure(s) Performed:  BREAST BIOPSY WITH NEEDLE LOCALIZATION - right needle localized breast lumpectomy   Patient Location: PACU  Anesthesia Type: General  Level of Consciousness: awake, alert , oriented and patient cooperative  Airway & Oxygen Therapy: Patient Spontanous Breathing and Patient connected to face mask oxygen  Post-op Assessment: Report given to PACU RN, Post -op Vital signs reviewed and stable and Patient moving all extremities  Post vital signs: Reviewed and stable  Complications: No apparent anesthesia complications

## 2011-04-27 NOTE — Interval H&P Note (Signed)
History and Physical Interval Note:  04/27/2011 11:33 AM  Natasha Fletcher  has presented today for surgery, with the diagnosis of right breast cancer   The various methods of treatment have been discussed with the patient and family. After consideration of risks, benefits and other options for treatment, the patient has consented to  Procedure(s): BREAST BIOPSY WITH NEEDLE LOCALIZATION as a surgical intervention .  The patients' history has been reviewed, patient examined, no change in status, stable for surgery.  I have reviewed the patients' chart and labs.  Questions were answered to the patient's satisfaction.     Trea Latner T

## 2011-04-28 ENCOUNTER — Encounter (HOSPITAL_BASED_OUTPATIENT_CLINIC_OR_DEPARTMENT_OTHER): Payer: Self-pay | Admitting: General Surgery

## 2011-04-30 ENCOUNTER — Telehealth (INDEPENDENT_AMBULATORY_CARE_PROVIDER_SITE_OTHER): Payer: Self-pay | Admitting: General Surgery

## 2011-04-30 NOTE — Telephone Encounter (Signed)
Called the patient and discussed pathology

## 2011-05-07 ENCOUNTER — Encounter (INDEPENDENT_AMBULATORY_CARE_PROVIDER_SITE_OTHER): Payer: Self-pay | Admitting: Surgery

## 2011-05-07 ENCOUNTER — Ambulatory Visit (INDEPENDENT_AMBULATORY_CARE_PROVIDER_SITE_OTHER): Payer: Medicare Other | Admitting: Surgery

## 2011-05-07 VITALS — BP 124/78 | HR 88 | Temp 97.4°F | Resp 18 | Ht 65.0 in | Wt 169.2 lb

## 2011-05-07 DIAGNOSIS — Z9889 Other specified postprocedural states: Secondary | ICD-10-CM

## 2011-05-07 NOTE — Progress Notes (Signed)
Natasha Fletcher    [id number] 05/07/2011    05-Jan-1926   CC: Post op Post op lumpectomy  HPI: The patient returns for post op follow-up. She underwent a right lumpectomy by Dr Johna Sheriff  on 04/27/2011. Over all she feels that she is doing well. She has resumed most normal activities  PE: The incision is healing nicely and there is no evidence of infection or hematoma.   DATA REVIEWED: Pathology report showed small IDC, negative margins  IMPRESSION: Patient doing well.   PLAN: Her next visit will be in about two months. Gave har a copy of path and told her she needs to follow up with Dr Michell Heinrich and Donnie Coffin.

## 2011-05-07 NOTE — Patient Instructions (Signed)
Follow up with Dr Donnie Coffin and Dr Michell Heinrich

## 2011-05-12 ENCOUNTER — Ambulatory Visit: Payer: Medicare Other | Admitting: Radiation Oncology

## 2011-05-12 ENCOUNTER — Telehealth: Payer: Self-pay | Admitting: *Deleted

## 2011-05-12 ENCOUNTER — Encounter: Payer: Self-pay | Admitting: Radiation Oncology

## 2011-05-12 ENCOUNTER — Ambulatory Visit
Admission: RE | Admit: 2011-05-12 | Discharge: 2011-05-12 | Disposition: A | Discharge status: Home / Self Care | Payer: Medicare Other | Source: Ambulatory Visit | Attending: Radiation Oncology | Admitting: Radiation Oncology

## 2011-05-12 VITALS — BP 137/75 | HR 100 | Temp 97.4°F | Resp 18 | Ht 65.0 in | Wt 168.8 lb

## 2011-05-12 DIAGNOSIS — C50119 Malignant neoplasm of central portion of unspecified female breast: Secondary | ICD-10-CM

## 2011-05-12 NOTE — Progress Notes (Signed)
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  PREVIOUS INTERVENTIONS:  Lumpectomy on 04/27/2011 revealing grade 1 invasive ductal carcinoma with negative margins.  Closest margin at 0.12 cm with invasive disease from the inferior margin.  INTERVAL HISTORY:  Natasha Fletcher reports for followup today.  She has recovered well from her surgery.  She said she took a few pain pills, but otherwise is doing well.  She has not been cleared to go back to her water aerobics class by Dr. Jamey Ripa.  She sees Dr. Donnie Coffin later this week to discuss antiestrogen therapy.  PHYSICAL EXAMINATION:  General:  She still looks great.  She is in no distress, sitting comfortably seeming table.  Vital Signs:  Weight 168 pounds.  Height 5 feet 5 inches.  Blood pressure 137/75, pulse 100, temperature 97.4.  Her incision is well-healed around the nipple.  It is really very difficult to see, actually.  IMPRESSION:  Stage I breast cancer in an 78 year old healthy female, ER/PR positive.  RECOMMENDATIONS:  Natasha Fletcher and I discussed the role of radiation in decreasing local recurrence.  We discussed the equivalency in terms of survival between radiation plus antiestrogen pills versus antiestrogen pills alone.  We discussed the small benefit that radiation would provide in terms of local control.  She really does not wish to proceed forward with radiation at this time.  From the survival standpoint, I certainly think that is reasonable.  She does have followup with Dr. Donnie Coffin later this week to discuss antiestrogen.  I encouraged her to contact me with any questions or concerns.  She has agreed to do so.    ______________________________ Lurline Hare, M.D. SW/MEDQ  D:  05/12/2011  T:  05/12/2011  Job:  [id number]

## 2011-05-12 NOTE — Progress Notes (Signed)
PT HERE FOR CONSULT OF RIGHT BREAST CANCER, HAS HAD LUMPECTOMY.  ER, PR POSITIVE , HER 2NEU NEG

## 2011-05-13 NOTE — Progress Notes (Signed)
Please see the Nurse Progress Note in the MD Initial Consult Encounter for this patient. 

## 2011-05-14 NOTE — Progress Notes (Signed)
Encounter addended by: Amanda Pea, RN on: 05/14/2011  5:02 PM<BR>     Documentation filed: Charges VN

## 2011-05-18 ENCOUNTER — Ambulatory Visit (HOSPITAL_BASED_OUTPATIENT_CLINIC_OR_DEPARTMENT_OTHER): Payer: Medicare Other | Admitting: Oncology

## 2011-05-18 VITALS — BP 144/69 | HR 101 | Temp 98.3°F | Ht 65.0 in | Wt 167.0 lb

## 2011-05-18 DIAGNOSIS — C50119 Malignant neoplasm of central portion of unspecified female breast: Secondary | ICD-10-CM

## 2011-05-18 MED ORDER — ANASTROZOLE 1 MG PO TABS: 1.0000 mg | ORAL_TABLET | Freq: Every day | ORAL | Status: AC

## 2011-05-18 NOTE — Progress Notes (Signed)
Patient History and Physical   Natasha Fletcher [id number] 1925/06/10 78 y.o. 05/18/2011  CC: Dr Johna Sheriff; dr Michell Heinrich  Chief Complaint: Breast cancer  HPI:  78 yo woman from [place] who underwent screening mammography which revealed architectural distortion in the central breast. A biopsy performed 12/19 showed low grade ductal ca , er and pr @ 100%, ki67 @ 8% , her2 -ve.  A MRI of the breast revealed   A 9x16x 13mm mass  With no other abnormailities. PMH: Past Medical History  Diagnosis Date  . Hypertension   . Asthma   . Arthritis   . Glaucoma   . Incontinence   . Wears dentures   . Wears glasses   . Thyroid disease   . GERD (gastroesophageal reflux disease)   . Cancer     rt breast    Past Surgical History  Procedure Date  . Total hip arthroplasty x 2 2007   . Shoulder surgery   . Hand surgery   . Appendectomy   Recent lumpectomy  Specimen, including laterality: Right breast lumpectomy with additional lateral margin excision. Procedure: Right breast lumpectomy with additional lateral margin excision. Grade: I Tubule formation: 2. Nuclear pleomorphism: 2. Mitotic: 1. Tumor size (glass slide measurement): 0.9 cm. Margins: Invasive, distance to closest margin: 0.12 cm from inferior margin. In-situ, distance to closest margin: 0.4 cm. Lymphovascular invasion: Not identified. Ductal carcinoma in situ: Grade: Low grade. Extensive intraductal component: No. Lobular neoplasia: No. Tumor focality: Unifocal. Treatment effect: N/A Extent of tumor: Tumor confined to breast parenchyma. Breast prognostic profile: Performed on [id number]. Estrogen receptor: 100%, positive. Progesterone receptor: 100%, positive. Her 2 neu: 1.42, no amplification. Ki-67: 8%, low. Non-neoplastic breast: Benign ducts associated with calcification. TNM: pT1b, pNX. Comments: A Her-2/neu by [facility] will be repeated on the current tumor and reported in an addendum. Zandra Abts  MD Pathologist, Electronic Signature (Case signed 04/29/2011) Specimen Gross and Clinical Information Specimen(s) Obtained: 1. Breast, lumpectomy, right 2. Breast, excision, right, additional lateral margin Allergies: Allergies  Allergen Reactions  . Sulfa Antibiotics Other (See Comments)    Unknown allergy    Medications: Per med list      Social History:   reports that she has been smoking 1/2 ppd., quit 30 y ago..  She does not have any smokeless tobacco history on file. She reports that she does not drink alcohol or use illicit drugs.She is single, originally from [place]. Attended [employer]. 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    Family History: No family history on file.no hx of breast or ovarian cancer in the family  Reproductive History G0P0 Menarche 17 No hx of hrt   Review of Systems: Constitutional ROS: Fever no, Chills, Night Sweats, Anorexia, Pain no Cardiovascular ROS: no chest pain or dyspnea on exertion Respiratory ROS: no cough, shortness of breath, or wheezing Neurological ROS: negative Dermatological ROS: negative ENT ROS: negative Gastrointestinal ROS: no abdominal pain, change in bowel habits, or black or bloody stools Genito-Urinary ROS: no dysuria, trouble voiding, or hematuria Hematological and Lymphatic ROS: negative Breast ROS: negative for breast lumps Musculoskeletal ROS: negative Remaining ROS negative.  Physical Exam: Blood pressure 144/69, pulse 101, temperature 98.3 F (36.8 C), temperature source Oral, height 5\' 5"  (1.651 m), weight 167 lb (75.751 kg). General appearance: alert, cooperative and appears stated age Head: Normocephalic, without obvious abnormality, atraumatic Neck: no adenopathy, no carotid bruit, no JVD, supple, symmetrical, trachea midline and thyroid not enlarged, symmetric, no tenderness/mass/nodules Lymph  nodes: Cervical, supraclavicular, and axillary nodes normal. lt supraclavicular rib c/w cervical  rib Heart exam - S1, S2 normal, no murmur, no gallop, rate regular regular rate and rhythm, S1, S2 normal, no murmur, click, rub or gallop abdomen is soft without significant tenderness, masses, organomegaly or guarding normal appearance, no masses or tenderness extremities normal, atraumatic, no cyanosis or edema Grossly normal Breasts: ecchymoses lateral rt breast, no mass in either breast Lab Results: Lab Results  Component Value Date   WBC 4.9 04/14/2011   HGB 10.9* 04/14/2011   HCT 34.3* 04/14/2011   MCV 85.1 04/14/2011   PLT 226 04/14/2011     Chemistry      Component Value Date/Time   NA 139 04/26/2011 0950   K 4.5 04/26/2011 0950   CL 104 04/26/2011 0950   CO2 25 04/26/2011 0950   BUN 16 04/26/2011 0950   CREATININE 0.93 04/26/2011 0950      Component Value Date/Time   CALCIUM 9.9 04/26/2011 0950   ALKPHOS 104 04/14/2011 0829   AST 22 04/14/2011 0829   ALT 16 04/14/2011 0829   BILITOT 0.5 04/14/2011 0829       Radiological Studies: n/a  Impression and Plan: 78 yo er+ breast mass,T1b, er+pr+. She is a good candidate for hormonal therapy. Dr Michell Heinrich will not be doing xrt. I will see her in f/u in 3 months. She will get a f/u bone density test as wellas instructions re- vitamin D.   45 minutes spent with the pt, 1/2 that time devoted to pt related counselling.  Pierce Crane, MD 05/18/2011, 4:03 PM

## 2011-05-18 NOTE — Patient Instructions (Signed)

## 2011-06-03 ENCOUNTER — Other Ambulatory Visit: Payer: Medicare Other

## 2011-06-03 ENCOUNTER — Ambulatory Visit
Admission: RE | Admit: 2011-06-03 | Discharge: 2011-06-03 | Disposition: A | Discharge status: Home / Self Care | Payer: Medicare Other | Source: Ambulatory Visit | Attending: Oncology | Admitting: Oncology

## 2011-06-03 DIAGNOSIS — C50119 Malignant neoplasm of central portion of unspecified female breast: Secondary | ICD-10-CM

## 2011-06-21 ENCOUNTER — Telehealth: Payer: Self-pay | Admitting: *Deleted

## 2011-06-21 NOTE — Telephone Encounter (Signed)
Patient called inquiring about results of bone density test done 2-3 weeks ago.  Will leave this request for providers to discuss with patient.  No f/u appt noted.  Seen 05-18-11 with mention of 3 month f/u.

## 2011-06-24 ENCOUNTER — Telehealth (INDEPENDENT_AMBULATORY_CARE_PROVIDER_SITE_OTHER): Payer: Self-pay

## 2011-06-24 NOTE — Telephone Encounter (Signed)
Return patient call-- patient would like to resume water aerobics, patient had lumpectomy in January.  Reviewed with Dr. Johna Sheriff and she can resume her activities, patient would like a note that states she may return to water aerobics.  Follow up appointment w/Dr. Johna Sheriff on 07/09/11 @ 2:15

## 2011-06-30 ENCOUNTER — Encounter (INDEPENDENT_AMBULATORY_CARE_PROVIDER_SITE_OTHER): Payer: Self-pay

## 2011-06-30 ENCOUNTER — Telehealth (INDEPENDENT_AMBULATORY_CARE_PROVIDER_SITE_OTHER): Payer: Self-pay

## 2011-06-30 NOTE — Telephone Encounter (Signed)
Letter stating Natasha Fletcher may resume water aerobics mailed to patient.

## 2011-07-09 ENCOUNTER — Encounter (INDEPENDENT_AMBULATORY_CARE_PROVIDER_SITE_OTHER): Payer: Self-pay | Admitting: General Surgery

## 2011-07-09 ENCOUNTER — Ambulatory Visit (INDEPENDENT_AMBULATORY_CARE_PROVIDER_SITE_OTHER): Payer: Medicare Other | Admitting: General Surgery

## 2011-07-09 VITALS — BP 132/74 | HR 70 | Temp 97.8°F | Resp 16 | Ht 66.0 in | Wt 163.4 lb

## 2011-07-09 DIAGNOSIS — C50119 Malignant neoplasm of central portion of unspecified female breast: Secondary | ICD-10-CM

## 2011-07-09 NOTE — Progress Notes (Signed)
History: Patient returns now 3 months after a needle localized excision of a 6 mm low-grade in situ cancer of the right breast. Margins were widely negative and radiation was not recommended. She is now on anastrozole 1 mg daily. She feels a little tired but no other apparent side effects. Her breast feels fine.  Exam: Gen.: Left American female in no distress Lymph nodes: No cervical supraclavicular axillary nodes palpable Breasts: Right breast incision is well healed without thickening or other complication or no palpable masses.  Assessment and plan: Doing well following treatment of stage 0 cancer of the right breast. I'll see her for long-term followup in 6 months appear

## 2011-07-20 ENCOUNTER — Encounter (INDEPENDENT_AMBULATORY_CARE_PROVIDER_SITE_OTHER): Payer: Medicare Other | Admitting: Surgery

## 2011-10-12 ENCOUNTER — Telehealth: Payer: Self-pay | Admitting: Oncology

## 2011-10-12 NOTE — Telephone Encounter (Signed)
Pt called to schedule her next f/u appt.

## 2011-10-21 ENCOUNTER — Other Ambulatory Visit (HOSPITAL_BASED_OUTPATIENT_CLINIC_OR_DEPARTMENT_OTHER): Payer: Medicare Other | Admitting: Lab

## 2011-10-21 ENCOUNTER — Ambulatory Visit (HOSPITAL_BASED_OUTPATIENT_CLINIC_OR_DEPARTMENT_OTHER): Payer: Medicare Other | Admitting: Oncology

## 2011-10-21 ENCOUNTER — Telehealth: Payer: Self-pay | Admitting: Oncology

## 2011-10-21 ENCOUNTER — Other Ambulatory Visit: Payer: Self-pay | Admitting: Oncology

## 2011-10-21 VITALS — BP 158/68 | HR 88 | Temp 97.5°F | Ht 66.0 in | Wt 161.6 lb

## 2011-10-21 DIAGNOSIS — E559 Vitamin D deficiency, unspecified: Secondary | ICD-10-CM

## 2011-10-21 DIAGNOSIS — Z170 Estrogen receptor positive status [ER+]: Secondary | ICD-10-CM

## 2011-10-21 DIAGNOSIS — C50119 Malignant neoplasm of central portion of unspecified female breast: Secondary | ICD-10-CM

## 2011-10-21 DIAGNOSIS — Z853 Personal history of malignant neoplasm of breast: Secondary | ICD-10-CM

## 2011-10-21 LAB — CBC WITH DIFFERENTIAL/PLATELET
BASO%: 0.5 % (ref 0.0–2.0)
EOS%: 0.7 % (ref 0.0–7.0)
Eosinophils Absolute: 0 10*3/uL (ref 0.0–0.5)
LYMPH%: 26.5 % (ref 14.0–49.7)
MCH: 27.5 pg (ref 25.1–34.0)
MCHC: 32.1 g/dL (ref 31.5–36.0)
MCV: 85.6 fL (ref 79.5–101.0)
MONO%: 5.8 % (ref 0.0–14.0)
NEUT#: 4.1 10*3/uL (ref 1.5–6.5)
Platelets: 205 10*3/uL (ref 145–400)
RBC: 4 10*6/uL (ref 3.70–5.45)
RDW: 15.2 % — ABNORMAL HIGH (ref 11.2–14.5)

## 2011-10-21 NOTE — Progress Notes (Signed)
Patient History and Physical   Natasha Fletcher [id number] Apr 20, 1925 78 y.o. 10/21/2011  CC: Dr Johna Sheriff; dr Michell Heinrich  Chief Complaint: Breast cancer  HPI:  78 yo woman from [place] who underwent screening mammography which revealed architectural distortion in the central breast. A biopsy performed 12/19 showed low grade ductal ca , er and pr @ 100%, ki67 @ 8% , her2 -ve.  A MRI of the breast revealed   A 9x16x 13mm mass  With no other abnormailities. PMH: Past Medical History  Diagnosis Date  . Hypertension   . Asthma   . Arthritis   . Glaucoma   . Incontinence   . Wears dentures   . Wears glasses   . Thyroid disease   . GERD (gastroesophageal reflux disease)   . Cancer     rt breast    Past Surgical History  Procedure Date  . Total hip arthroplasty x 2 2007   . Shoulder surgery   . Hand surgery   . Appendectomy   Recent lumpectomy  Specimen, including laterality: Right breast lumpectomy with additional lateral margin excision. Procedure: Right breast lumpectomy with additional lateral margin excision. Grade: I Tubule formation: 2. Nuclear pleomorphism: 2. Mitotic: 1. Tumor size (glass slide measurement): 0.9 cm. Margins: Invasive, distance to closest margin: 0.12 cm from inferior margin. In-situ, distance to closest margin: 0.4 cm. Lymphovascular invasion: Not identified. Ductal carcinoma in situ: Grade: Low grade. Extensive intraductal component: No. Lobular neoplasia: No. Tumor focality: Unifocal. Treatment effect: N/A Extent of tumor: Tumor confined to breast parenchyma. Breast prognostic profile: Performed on [id number]. Estrogen receptor: 100%, positive. Progesterone receptor: 100%, positive. Her 2 neu: 1.42, no amplification. Ki-67: 8%, low. Non-neoplastic breast: Benign ducts associated with calcification. TNM: pT1b, pNX. Comments: A Her-2/neu by [facility] will be repeated on the current tumor and reported in an addendum. Zandra Abts  MD Pathologist, Electronic Signature (Case signed 04/29/2011) Specimen Gross and Clinical Information Specimen(s) Obtained: 1. Breast, lumpectomy, right 2. Breast, excision, right, additional lateral margin Allergies: Allergies  Allergen Reactions  . Sulfa Antibiotics Other (See Comments)    Unknown allergy    Medications: Per med list      S Review of Systems: Constitutional ROS: Fever no, Chills, Night Sweats, Anorexia, Pain no Cardiovascular ROS: no chest pain or dyspnea on exertion Respiratory ROS: no cough, shortness of breath, or wheezing Neurological ROS: negative Dermatological ROS: negative ENT ROS: negative Gastrointestinal ROS: no abdominal pain, change in bowel habits, or black or bloody stools Genito-Urinary ROS: no dysuria, trouble voiding, or hematuria Hematological and Lymphatic ROS: negative Breast ROS: negative for breast lumps Musculoskeletal ROS: negative Remaining ROS negative.  Physical Exam: Blood pressure 158/68, pulse 88, temperature 97.5 F (36.4 C), height 5\' 6"  (1.676 m), weight 161 lb 9.6 oz (73.301 kg). General appearance: alert, cooperative and appears stated age Head: Normocephalic, without obvious abnormality, atraumatic Neck: no adenopathy, no carotid bruit, no JVD, supple, symmetrical, trachea midline and thyroid not enlarged, symmetric, no tenderness/mass/nodules Lymph nodes: Cervical, supraclavicular, and axillary nodes normal. lt supraclavicular rib c/w cervical rib Heart exam - S1, S2 normal, no murmur, no gallop, rate regular regular rate and rhythm, S1, S2 normal, no murmur, click, rub or gallop abdomen is soft without significant tenderness, masses, organomegaly or guarding normal appearance, no masses or tenderness extremities normal, atraumatic, no cyanosis or edema Grossly normal Breasts: ecchymoses lateral rt breast, no mass in either breast Lab Results: Lab Results  Component Value Date   WBC 6.2 10/21/2011   HGB  11.0*  10/21/2011   HCT 34.2* 10/21/2011   MCV 85.6 10/21/2011   PLT 205 10/21/2011     Chemistry      Component Value Date/Time   NA 139 04/26/2011 0950   K 4.5 04/26/2011 0950   CL 104 04/26/2011 0950   CO2 25 04/26/2011 0950   BUN 16 04/26/2011 0950   CREATININE 0.93 04/26/2011 0950      Component Value Date/Time   CALCIUM 9.9 04/26/2011 0950   ALKPHOS 104 04/14/2011 0829   AST 22 04/14/2011 0829   ALT 16 04/14/2011 0829   BILITOT 0.5 04/14/2011 0829       Radiological Studies: n/a  Impression and Plan: 78 yo er+ breast mass,T1b, er+pr+. She is on Arimidex and tolerating it well. We reviewed her symptom off from the menopause relief scale. She appears doing well been having some joint pains which is likely chronic. Her current breast exam is unremarkable. We'll go ahead and get a followup mammogram of the ipsilateral side this month and then bilateral mammograms in 6 months when I see her again. I reviewed her bone density results which were normal. She will continue to take her same medications.             45 minutes spent with the pt, 1/2 that time devoted to pt related counselling.  Pierce Crane, MD 10/21/2011, 11:43 AM

## 2011-10-21 NOTE — Telephone Encounter (Signed)
gve the pt her feb 2014 appt calendar along with the mammo appt for november

## 2011-10-22 LAB — COMPREHENSIVE METABOLIC PANEL
ALT: 18 U/L (ref 0–35)
AST: 21 U/L (ref 0–37)
Albumin: 4 g/dL (ref 3.5–5.2)
Alkaline Phosphatase: 65 U/L (ref 39–117)
Potassium: 3.8 mEq/L (ref 3.5–5.3)
Sodium: 140 mEq/L (ref 135–145)
Total Bilirubin: 0.6 mg/dL (ref 0.3–1.2)
Total Protein: 6.2 g/dL (ref 6.0–8.3)

## 2011-11-30 ENCOUNTER — Encounter: Payer: Self-pay | Admitting: Emergency Medicine

## 2011-11-30 NOTE — Progress Notes (Signed)
Patient called requesting to transfer her care to a female oncologist.  Dr Donnie Coffin and Tami aware.

## 2011-12-02 ENCOUNTER — Telehealth: Payer: Self-pay | Admitting: *Deleted

## 2011-12-02 NOTE — Telephone Encounter (Signed)
Patient requested to see a female Med Onc.  Got the [facility]  Confirmed 12/31/11 appt w/ pt.  Took paperwork to Med Rec for chart.

## 2011-12-16 ENCOUNTER — Other Ambulatory Visit: Payer: Self-pay | Admitting: *Deleted

## 2011-12-16 DIAGNOSIS — C50119 Malignant neoplasm of central portion of unspecified female breast: Secondary | ICD-10-CM

## 2011-12-31 ENCOUNTER — Telehealth: Payer: Self-pay | Admitting: Oncology

## 2011-12-31 ENCOUNTER — Ambulatory Visit (HOSPITAL_BASED_OUTPATIENT_CLINIC_OR_DEPARTMENT_OTHER): Payer: Medicare Other | Admitting: Oncology

## 2011-12-31 ENCOUNTER — Other Ambulatory Visit (HOSPITAL_BASED_OUTPATIENT_CLINIC_OR_DEPARTMENT_OTHER): Payer: Medicare Other | Admitting: Lab

## 2011-12-31 ENCOUNTER — Encounter: Payer: Self-pay | Admitting: Oncology

## 2011-12-31 VITALS — BP 164/68 | HR 98 | Temp 98.1°F | Resp 18 | Ht 66.0 in | Wt 167.4 lb

## 2011-12-31 DIAGNOSIS — C50119 Malignant neoplasm of central portion of unspecified female breast: Secondary | ICD-10-CM

## 2011-12-31 DIAGNOSIS — Z170 Estrogen receptor positive status [ER+]: Secondary | ICD-10-CM

## 2011-12-31 LAB — COMPREHENSIVE METABOLIC PANEL (CC13)
Alkaline Phosphatase: 88 U/L (ref 40–150)
BUN: 19 mg/dL (ref 7.0–26.0)
CO2: 26 mEq/L (ref 22–29)
Glucose: 126 mg/dl — ABNORMAL HIGH (ref 70–99)
Total Bilirubin: 0.5 mg/dL (ref 0.20–1.20)
Total Protein: 6.9 g/dL (ref 6.4–8.3)

## 2011-12-31 LAB — CBC WITH DIFFERENTIAL/PLATELET
Basophils Absolute: 0.1 10*3/uL (ref 0.0–0.1)
Eosinophils Absolute: 0 10*3/uL (ref 0.0–0.5)
HCT: 36.8 % (ref 34.8–46.6)
HGB: 11.8 g/dL (ref 11.6–15.9)
LYMPH%: 13.7 % — ABNORMAL LOW (ref 14.0–49.7)
MCV: 87.7 fL (ref 79.5–101.0)
MONO#: 0.3 10*3/uL (ref 0.1–0.9)
MONO%: 3.7 % (ref 0.0–14.0)
NEUT#: 6.4 10*3/uL (ref 1.5–6.5)
NEUT%: 81 % — ABNORMAL HIGH (ref 38.4–76.8)
Platelets: 226 10*3/uL (ref 145–400)
RBC: 4.2 10*6/uL (ref 3.70–5.45)
WBC: 8 10*3/uL (ref 3.9–10.3)

## 2011-12-31 NOTE — Patient Instructions (Addendum)
Doing well on arimidex.   You are cancer free  I will continue to see you every 6 months

## 2011-12-31 NOTE — Telephone Encounter (Signed)
gve the pt her march 2014 appt calendar °

## 2011-12-31 NOTE — Progress Notes (Signed)
OFFICE PROGRESS NOTE  CC  ROBERTS, Natasha Ammons, MD [address] [place] [address] Dr. Glenna Fellows Dr. Lurline Hare  DIAGNOSIS: 78 year old female with diagnosis of right breast cancer status post right lumpectomy and with the final pathology revealing a 0.9 cm ER positive breast cancer.  PRIOR THERAPY:  #1 patient underwent a right breast lumpectomy for a 0.9 cm ER positive PR positive HER-2/neu negative invasive ductal carcinoma of the right breast.  #2 patient was then begun on antiestrogen therapy by Dr. Pierce Crane. Patient subsequently wanted to change her care to Dr. Welton Flakes  CURRENT THERAPY: Arimidex 1 mg daily  INTERVAL HISTORY: Natasha Fletcher 78 y.o. female returns for followup visit. She has not changed her care to me. Clinically she is doing well she is tolerating the Arimidex without any significant problems. She denies any nausea vomiting fevers chills night sweats no shortness of breath chest pains or palpitations no myalgias she does have some arthritic type of pains but they're pretty stable. She denies having any hot flashes no vaginal discharge or bleeding. Remainder of the 10 point review of systems is negative.  MEDICAL HISTORY: Past Medical History  Diagnosis Date  . Hypertension   . Asthma   . Arthritis   . Glaucoma   . Incontinence   . Wears dentures   . Wears glasses   . Thyroid disease   . GERD (gastroesophageal reflux disease)   . Cancer     rt breast    ALLERGIES:  is allergic to sulfa antibiotics.  MEDICATIONS:  Current Outpatient Prescriptions  Medication Sig Dispense Refill  . albuterol (PROVENTIL HFA;VENTOLIN HFA) 108 (90 BASE) MCG/ACT inhaler Inhale 2 puffs into the lungs as needed.      Marland Kitchen albuterol (PROVENTIL) (2.5 MG/3ML) 0.083% nebulizer solution Take 2.5 mg by nebulization every 6 (six) hours as needed.      Marland Kitchen anastrozole (ARIMIDEX) 1 MG tablet Daily.      . calcium carbonate 200 MG capsule Take 1,200 mg by  mouth 2 (two) times daily with a meal.      . Cholecalciferol (VITAMIN D-3 PO) Take 1 tablet by mouth daily. Pt. Not sure of dose      . fluticasone (FLOVENT HFA) 110 MCG/ACT inhaler Inhale 2 puffs into the lungs 2 (two) times daily.      Marland Kitchen latanoprost (XALATAN) 0.005 % ophthalmic solution Place 1 drop into both eyes at bedtime.      Marland Kitchen lisinopril-hydrochlorothiazide (PRINZIDE,ZESTORETIC) 20-25 MG per tablet Take 1 tablet by mouth daily.      Marland Kitchen omeprazole (PRILOSEC) 20 MG capsule Take 20 mg by mouth daily as needed.      Marland Kitchen oxybutynin (DITROPAN) 5 MG tablet Take 5 mg by mouth 3 (three) times daily as needed.      . predniSONE (DELTASONE) 5 MG tablet Take 5 mg by mouth 2 (two) times daily.      . traMADol (ULTRAM) 50 MG tablet Take 50 mg by mouth. One to two tabs three times a day prn      . vitamin B-12 (CYANOCOBALAMIN) 100 MCG tablet Take 50 mcg by mouth 2 (two) times daily. Pt. Not sure of dose.        SURGICAL HISTORY:  Past Surgical History  Procedure Date  . Total hip arthroplasty   . Shoulder surgery   . Hand surgery   . Appendectomy   . Carpal tunnel release     rt  . Tonsillectomy   .  Colonoscopy   . Abdominal hysterectomy   . Eye surgery     both cataracts  . Breast biopsy 04/27/2011    Procedure: BREAST BIOPSY WITH NEEDLE LOCALIZATION;  Surgeon: Mariella Saa, MD;  Location: [facility];  Service: General;  Laterality: Right;  right needle localized breast lumpectomy   . Breast lumpectomy 04/27/11    right breast by [facility]    REVIEW OF SYSTEMS:  Pertinent items are noted in HPI.   PHYSICAL EXAMINATION:  Well-developed well nourished elderly female in no acute distress appearing younger than her stated age HEENT exam EOMI PERRLA sclerae anicteric no conjunctival pallor oral mucosa is moist neck is supple lungs are clear bilaterally to auscultation cardiovascular is regular rate rhythm with a 2/6 murmur abdomen is soft nontender nondistended bowel sounds  are present no HSM extremities no edema neuro patient's alert oriented otherwise nonfocal right breast healed incisional scar no masses or nipple discharge left breast no masses nipple discharge.  ECOG PERFORMANCE STATUS: 1 - Symptomatic but completely ambulatory  Blood pressure 164/68, pulse 98, temperature 98.1 F (36.7 C), temperature source Oral, resp. rate 18, height 5\' 6"  (1.676 m), weight 167 lb 6.4 oz (75.932 kg).  LABORATORY DATA: Lab Results  Component Value Date   WBC 8.0 12/31/2011   HGB 11.8 12/31/2011   HCT 36.8 12/31/2011   MCV 87.7 12/31/2011   PLT 226 12/31/2011      Chemistry      Component Value Date/Time   NA 139 12/31/2011 1351   NA 140 10/21/2011 1057   K 4.1 12/31/2011 1351   K 3.8 10/21/2011 1057   CL 102 12/31/2011 1351   CL 103 10/21/2011 1057   CO2 26 12/31/2011 1351   CO2 31 10/21/2011 1057   BUN 19.0 12/31/2011 1351   BUN 20 10/21/2011 1057   CREATININE 1.0 12/31/2011 1351   CREATININE 0.97 10/21/2011 1057      Component Value Date/Time   CALCIUM 10.4 12/31/2011 1351   CALCIUM 9.9 10/21/2011 1057   ALKPHOS 88 12/31/2011 1351   ALKPHOS 65 10/21/2011 1057   AST 20 12/31/2011 1351   AST 21 10/21/2011 1057   ALT 21 12/31/2011 1351   ALT 18 10/21/2011 1057   BILITOT 0.50 12/31/2011 1351   BILITOT 0.6 10/21/2011 1057       RADIOGRAPHIC STUDIES:  No results found.  ASSESSMENT: 78 year old female with stage I invasive ductal carcinoma of the right breast status post lumpectomy followed by antiestrogen therapy adjuvantly. Overall she's doing well tolerating it well. Her overall prognosis is I think excellent. She will continue the Arimidex for a total of 5 years risks and benefits of this were discussed with her. She has had a bone density scan performed a rate we will continue to monitor these on a regular basis.   PLAN:   #1 continue Arimidex 1 mg daily total of 5 years of therapy is planned.  #2 she'll return in 6 months time for followup.  All questions were  answered. The patient knows to call the clinic with any problems, questions or concerns. We can certainly see the patient much sooner if necessary.  I spent 25 minutes counseling the patient face to face. The total time spent in the appointment was 30 minutes.    Drue Second, MD Medical/Oncology [facility] [phone] (beeper) [phone] (Office)  12/31/2011, 3:31 PM

## 2012-01-07 ENCOUNTER — Ambulatory Visit (INDEPENDENT_AMBULATORY_CARE_PROVIDER_SITE_OTHER): Payer: Medicare Other | Admitting: General Surgery

## 2012-01-07 ENCOUNTER — Encounter (INDEPENDENT_AMBULATORY_CARE_PROVIDER_SITE_OTHER): Payer: Self-pay | Admitting: General Surgery

## 2012-01-07 VITALS — BP 152/74 | HR 92 | Temp 97.1°F | Resp 16 | Ht 67.0 in | Wt 167.4 lb

## 2012-01-07 DIAGNOSIS — C50119 Malignant neoplasm of central portion of unspecified female breast: Secondary | ICD-10-CM

## 2012-01-07 NOTE — Progress Notes (Signed)
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 She had widely negative margins and has been on hormone therapy and no radiation. She reports she thinks the hormone pill makes her a little tired sometimes but generally is feeling well. She denies any complaints specific to her breasts such as lump or nipple discharge or skin changes.  Exam: Gen.: Elderly but healthy-appearing African American female in no distress Lymph nodes no cervical, supraclavicular, or axillary nodes palpable Breasts: Well-healed circumareolar incision on the right. There are no palpable masses in either breast particular attention to the lumpectomy site on the right.  Imaging: mammogram is due in November  Assessment and plan: doing well with no evidence of recurrent or new breast cancer. Return in 6 months.

## 2012-02-10 ENCOUNTER — Telehealth: Payer: Self-pay | Admitting: *Deleted

## 2012-02-10 NOTE — Telephone Encounter (Signed)
Gave patient appointment for 02-24-2012 

## 2012-02-10 NOTE — Telephone Encounter (Signed)
Have patient stop the medications. And schedule for follow up with LA/KK in 2 -3 weeks

## 2012-02-10 NOTE — Telephone Encounter (Signed)
Pt called states " I've been taking that pill that Dr. Welton Flakes gave me (Arimidex) and it's making my bones ache. I cant hardly get up and it takes me a while to get moving." F/U 06/30/12.  WIll review with MD

## 2012-02-10 NOTE — Telephone Encounter (Signed)
Per MD, notified pt to stop medication. Expect a call from Scheduling for a follow up with provider to re-evaluated pt. Onc Tx sent

## 2012-02-24 ENCOUNTER — Ambulatory Visit: Payer: Medicare Other | Admitting: Adult Health

## 2012-02-24 ENCOUNTER — Other Ambulatory Visit: Payer: Medicare Other | Admitting: Lab

## 2012-02-25 ENCOUNTER — Ambulatory Visit: Payer: Medicare Other | Admitting: Adult Health

## 2012-02-28 ENCOUNTER — Ambulatory Visit: Payer: Medicare Other | Admitting: Adult Health

## 2012-02-28 ENCOUNTER — Ambulatory Visit
Admission: RE | Admit: 2012-02-28 | Discharge: 2012-02-28 | Disposition: A | Discharge status: Home / Self Care | Payer: Medicare Other | Source: Ambulatory Visit | Attending: Oncology | Admitting: Oncology

## 2012-02-28 DIAGNOSIS — Z853 Personal history of malignant neoplasm of breast: Secondary | ICD-10-CM

## 2012-02-29 ENCOUNTER — Telehealth: Payer: Self-pay | Admitting: *Deleted

## 2012-02-29 ENCOUNTER — Other Ambulatory Visit (HOSPITAL_BASED_OUTPATIENT_CLINIC_OR_DEPARTMENT_OTHER): Payer: Medicare Other | Admitting: Lab

## 2012-02-29 ENCOUNTER — Encounter: Payer: Self-pay | Admitting: Adult Health

## 2012-02-29 ENCOUNTER — Ambulatory Visit (HOSPITAL_BASED_OUTPATIENT_CLINIC_OR_DEPARTMENT_OTHER): Payer: Medicare Other | Admitting: Adult Health

## 2012-02-29 VITALS — BP 120/70 | HR 91 | Temp 97.7°F | Resp 20 | Ht 67.0 in | Wt 169.5 lb

## 2012-02-29 DIAGNOSIS — Z170 Estrogen receptor positive status [ER+]: Secondary | ICD-10-CM

## 2012-02-29 DIAGNOSIS — R5381 Other malaise: Secondary | ICD-10-CM

## 2012-02-29 DIAGNOSIS — C50119 Malignant neoplasm of central portion of unspecified female breast: Secondary | ICD-10-CM

## 2012-02-29 DIAGNOSIS — M2550 Pain in unspecified joint: Secondary | ICD-10-CM

## 2012-02-29 LAB — CBC WITH DIFFERENTIAL/PLATELET
BASO%: 0.6 % (ref 0.0–2.0)
Eosinophils Absolute: 0.2 10*3/uL (ref 0.0–0.5)
LYMPH%: 15.3 % (ref 14.0–49.7)
MCHC: 31.3 g/dL — ABNORMAL LOW (ref 31.5–36.0)
MCV: 87.2 fL (ref 79.5–101.0)
MONO%: 5.1 % (ref 0.0–14.0)
Platelets: 211 10*3/uL (ref 145–400)
RBC: 4.23 10*6/uL (ref 3.70–5.45)

## 2012-02-29 LAB — COMPREHENSIVE METABOLIC PANEL (CC13)
Alkaline Phosphatase: 92 U/L (ref 40–150)
Creatinine: 1.1 mg/dL (ref 0.6–1.1)
Glucose: 100 mg/dl — ABNORMAL HIGH (ref 70–99)
Sodium: 142 mEq/L (ref 136–145)
Total Bilirubin: 0.7 mg/dL (ref 0.20–1.20)
Total Protein: 6.9 g/dL (ref 6.4–8.3)

## 2012-02-29 NOTE — Progress Notes (Signed)
OFFICE PROGRESS NOTE  CC  ROBERTS, Natasha Ammons, MD [address] [place] [address] Dr. Glenna Fellows Dr. Lurline Hare  DIAGNOSIS: 78 year old female with diagnosis of right breast cancer status post right lumpectomy and with the final pathology revealing a 0.9 cm ER positive breast cancer.  PRIOR THERAPY:  #1 patient underwent a right breast lumpectomy for a 0.9 cm ER positive PR positive HER-2/neu negative invasive ductal carcinoma of the right breast.  #2 patient was then begun on antiestrogen therapy by Dr. Pierce Crane. Patient subsequently wanted to change her care to Dr. Welton Flakes  CURRENT THERAPY: Arimidex 1 mg daily  INTERVAL HISTORY: Natasha Fletcher 78 y.o. female is here for an interim visit.  She began having worsening joint aches and fatigue a couple of weeks ago.  She has stopped the Arimidex, but has not noticed any difference in her symptoms.  She does have a history of osteoarthritis and states that she feels its getting worse.  She has a physician that manages this, and she hasn't seen him in quite some time.  She also hasn't been exercising with water aerobics like she previously did before her lumpectomy.  Otherwise, she's feeling well and w/o questions/concerns.    MEDICAL HISTORY: Past Medical History  Diagnosis Date  . Hypertension   . Asthma   . Arthritis   . Glaucoma   . Incontinence   . Wears dentures   . Wears glasses   . Thyroid disease   . GERD (gastroesophageal reflux disease)   . Cancer     rt breast    ALLERGIES:  is allergic to sulfa antibiotics.  MEDICATIONS:  Current Outpatient Prescriptions  Medication Sig Dispense Refill  . albuterol (PROVENTIL HFA;VENTOLIN HFA) 108 (90 BASE) MCG/ACT inhaler Inhale 2 puffs into the lungs as needed.      Marland Kitchen albuterol (PROVENTIL) (2.5 MG/3ML) 0.083% nebulizer solution Take 2.5 mg by nebulization every 6 (six) hours as needed.      Marland Kitchen anastrozole (ARIMIDEX) 1 MG tablet Daily.      .  calcium carbonate 200 MG capsule Take 1,200 mg by mouth 2 (two) times daily with a meal.      . Cholecalciferol (VITAMIN D-3 PO) Take 1 tablet by mouth daily. Pt. Not sure of dose      . fluticasone (FLOVENT HFA) 110 MCG/ACT inhaler Inhale 2 puffs into the lungs 2 (two) times daily.      Marland Kitchen latanoprost (XALATAN) 0.005 % ophthalmic solution Place 1 drop into both eyes at bedtime.      Marland Kitchen lisinopril-hydrochlorothiazide (PRINZIDE,ZESTORETIC) 20-25 MG per tablet Take 1 tablet by mouth daily.      Marland Kitchen omeprazole (PRILOSEC) 20 MG capsule Take 20 mg by mouth daily as needed.      Marland Kitchen oxybutynin (DITROPAN) 5 MG tablet Take 5 mg by mouth 3 (three) times daily as needed.      . predniSONE (DELTASONE) 5 MG tablet Take 5 mg by mouth 2 (two) times daily.      . traMADol (ULTRAM) 50 MG tablet Take 50 mg by mouth. One to two tabs three times a day prn      . triazolam (HALCION) 0.125 MG tablet Take 0.125 mg by mouth at bedtime as needed.      . vitamin B-12 (CYANOCOBALAMIN) 100 MCG tablet Take 50 mcg by mouth 2 (two) times daily. Pt. Not sure of dose.        SURGICAL HISTORY:  Past Surgical History  Procedure Date  . Total hip arthroplasty   . Shoulder surgery   . Hand surgery   . Appendectomy   . Carpal tunnel release     rt  . Tonsillectomy   . Colonoscopy   . Abdominal hysterectomy   . Eye surgery     both cataracts  . Breast biopsy 04/27/2011    Procedure: BREAST BIOPSY WITH NEEDLE LOCALIZATION;  Surgeon: Mariella Saa, MD;  Location: [facility];  Service: General;  Laterality: Right;  right needle localized breast lumpectomy   . Breast lumpectomy 04/27/11    right breast by [facility]    REVIEW OF SYSTEMS:   General: fatigue (-), night sweats (-), fever (-), pain (-) Lymph: palpable nodes (-) HEENT: vision changes (-), mucositis (-), gum bleeding (-), epistaxis (-) Cardiovascular: chest pain (-), palpitations (-) Pulmonary: shortness of breath (-), dyspnea on exertion (-),  cough (-), hemoptysis (-) GI:  Early satiety (-), melena (-), dysphagia (-), nausea/vomiting (-), diarrhea (-) GU: dysuria (-), hematuria (-), incontinence (-) Musculoskeletal: joint swelling (-), joint pain (-), back pain (-) Neuro: weakness (-), numbness (-), headache (-), confusion (-) Skin: Rash (-), lesions (-), dryness (-) Psych: depression (-), suicidal/homicidal ideation (-), feeling of hopelessness (-)  Health Maintenance  Mammogram:02/28/12 Colonoscopy:2 years ago Bone Density Scan:2/13 Pap Smear: unknown Eye Exam: 09/13 Vitamin D Level: 07/13 (normal) Lipid Panel: 2 years ago   PHYSICAL EXAMINATION:  BP 120/70  Pulse 91  Temp 97.7 F (36.5 C) (Oral)  Resp 20  Ht 5\' 7"  (1.702 m)  Wt 169 lb 8 oz (76.885 kg)  BMI 26.55 kg/m2 General: Patient is a well appearing female in no acute distress HEENT: PERRLA, sclerae anicteric no conjunctival pallor, MMM Neck: supple, no palpable adenopathy Lungs: clear to auscultation bilaterally, no wheezes, rhonchi, or rales Cardiovascular: regular rate rhythm, S1, S2, no murmurs, rubs or gallops Abdomen: Soft, non-tender, non-distended, normoactive bowel sounds, no HSM Extremities: warm and well perfused, no clubbing, cyanosis, or edema Skin: No rashes or lesions Neuro: Non-focal Right breast: healed incisional scar no masses or nipple discharge  Left breast: no masses nipple discharge. ECOG PERFORMANCE STATUS: 1 - Symptomatic but completely ambulatory  LABORATORY DATA: Lab Results  Component Value Date   WBC 7.1 02/29/2012   HGB 11.6 02/29/2012   HCT 36.9 02/29/2012   MCV 87.2 02/29/2012   PLT 211 02/29/2012      Chemistry      Component Value Date/Time   NA 139 12/31/2011 1351   NA 140 10/21/2011 1057   K 4.1 12/31/2011 1351   K 3.8 10/21/2011 1057   CL 102 12/31/2011 1351   CL 103 10/21/2011 1057   CO2 26 12/31/2011 1351   CO2 31 10/21/2011 1057   BUN 19.0 12/31/2011 1351   BUN 20 10/21/2011 1057   CREATININE 1.0  12/31/2011 1351   CREATININE 0.97 10/21/2011 1057      Component Value Date/Time   CALCIUM 10.4 12/31/2011 1351   CALCIUM 9.9 10/21/2011 1057   ALKPHOS 88 12/31/2011 1351   ALKPHOS 65 10/21/2011 1057   AST 20 12/31/2011 1351   AST 21 10/21/2011 1057   ALT 21 12/31/2011 1351   ALT 18 10/21/2011 1057   BILITOT 0.50 12/31/2011 1351   BILITOT 0.6 10/21/2011 1057       RADIOGRAPHIC STUDIES:  No results found.  ASSESSMENT: 78 year old female with stage I invasive ductal carcinoma of the right breast status post lumpectomy followed by antiestrogen therapy adjuvantly.  Overall she's doing well tolerating it well. Her overall prognosis is I think excellent. She will continue the Arimidex for a total of 5 years risks and benefits of this were discussed with her. She has had a bone density scan performed a rate we will continue to monitor these on a regular basis.   PLAN:   #1 Since the cessation of Mrs. Nachtigal' arimidex didn't alleviate her joint pain and fatigue, she will restart the medication.  Should her pain or fatigue worsen, I will prescribe aromasin.    #2 We discussed survivorship and exercise.  She is going to start back doing water aerobics, and hopefully that will help with her fatigue.    #3 I will see her back in 3 months to ensure she's continuing to tolerate her aromatase inhibitor well.   All questions were answered. The patient knows to call the clinic with any problems, questions or concerns. We can certainly see the patient much sooner if necessary.  I spent 25 minutes counseling the patient face to face. The total time spent in the appointment was 30 minutes.  Cherie Ouch Lyn Hollingshead, NP Medical Oncology [facility] Phone: [phone] 02/29/2012, 12:10 PM

## 2012-02-29 NOTE — Patient Instructions (Addendum)
Doing well.  Restart your Arimidex and start back to water aerobics.  Please call your arthritis doctor and get in to see him about what he can do for your joint pain.

## 2012-02-29 NOTE — Telephone Encounter (Signed)
Gave patient appointment for 06-2012  

## 2012-05-30 ENCOUNTER — Ambulatory Visit: Payer: Medicare Other | Admitting: Oncology

## 2012-05-30 ENCOUNTER — Other Ambulatory Visit: Payer: Medicare Other | Admitting: Lab

## 2012-06-14 ENCOUNTER — Other Ambulatory Visit: Payer: Self-pay | Admitting: *Deleted

## 2012-06-14 MED ORDER — ANASTROZOLE 1 MG PO TABS: 1.0000 mg | ORAL_TABLET | Freq: Every day | ORAL | Status: DC

## 2012-06-27 ENCOUNTER — Telehealth: Payer: Self-pay | Admitting: Oncology

## 2012-06-29 ENCOUNTER — Other Ambulatory Visit: Payer: Self-pay | Admitting: Medical Oncology

## 2012-06-29 DIAGNOSIS — C50111 Malignant neoplasm of central portion of right female breast: Secondary | ICD-10-CM

## 2012-06-30 ENCOUNTER — Encounter: Payer: Self-pay | Admitting: Oncology

## 2012-06-30 ENCOUNTER — Other Ambulatory Visit (HOSPITAL_BASED_OUTPATIENT_CLINIC_OR_DEPARTMENT_OTHER): Payer: Medicare Other | Admitting: Lab

## 2012-06-30 ENCOUNTER — Telehealth: Payer: Self-pay | Admitting: Oncology

## 2012-06-30 ENCOUNTER — Ambulatory Visit (HOSPITAL_BASED_OUTPATIENT_CLINIC_OR_DEPARTMENT_OTHER): Payer: Medicare Other | Admitting: Oncology

## 2012-06-30 VITALS — BP 125/71 | HR 108 | Temp 97.1°F | Resp 20 | Ht 67.0 in | Wt 172.0 lb

## 2012-06-30 DIAGNOSIS — C50919 Malignant neoplasm of unspecified site of unspecified female breast: Secondary | ICD-10-CM

## 2012-06-30 DIAGNOSIS — R5383 Other fatigue: Secondary | ICD-10-CM

## 2012-06-30 DIAGNOSIS — R5381 Other malaise: Secondary | ICD-10-CM

## 2012-06-30 DIAGNOSIS — C50111 Malignant neoplasm of central portion of right female breast: Secondary | ICD-10-CM

## 2012-06-30 DIAGNOSIS — C50119 Malignant neoplasm of central portion of unspecified female breast: Secondary | ICD-10-CM

## 2012-06-30 DIAGNOSIS — Z170 Estrogen receptor positive status [ER+]: Secondary | ICD-10-CM

## 2012-06-30 LAB — CBC WITH DIFFERENTIAL/PLATELET
Basophils Absolute: 0 10*3/uL (ref 0.0–0.1)
Eosinophils Absolute: 0.1 10*3/uL (ref 0.0–0.5)
HCT: 36.6 % (ref 34.8–46.6)
LYMPH%: 29.1 % (ref 14.0–49.7)
MCV: 85 fL (ref 79.5–101.0)
MONO%: 5 % (ref 0.0–14.0)
NEUT#: 4.1 10*3/uL (ref 1.5–6.5)
NEUT%: 63.4 % (ref 38.4–76.8)
Platelets: 207 10*3/uL (ref 145–400)
RBC: 4.31 10*6/uL (ref 3.70–5.45)

## 2012-06-30 LAB — COMPREHENSIVE METABOLIC PANEL (CC13)
ALT: 13 U/L (ref 0–55)
AST: 20 U/L (ref 5–34)
Alkaline Phosphatase: 86 U/L (ref 40–150)
CO2: 28 mEq/L (ref 22–29)
Sodium: 143 mEq/L (ref 136–145)
Total Bilirubin: 0.67 mg/dL (ref 0.20–1.20)
Total Protein: 6.9 g/dL (ref 6.4–8.3)

## 2012-06-30 NOTE — Progress Notes (Signed)
OFFICE PROGRESS NOTE  CC  Fletcher, Natasha Ammons, MD [address] [place] [address] Dr. Glenna Fellows Dr. Lurline Hare  DIAGNOSIS: 79 year old female with diagnosis of right breast cancer status post right lumpectomy and with the final pathology revealing a 0.9 cm ER positive breast cancer.  PRIOR THERAPY:  #1 patient underwent a right breast lumpectomy for a 0.9 cm ER positive PR positive HER-2/neu negative invasive ductal carcinoma of the right breast.  #2 patient was then begun on antiestrogen therapy by Dr. Pierce Crane. Patient subsequently wanted to change her care to Dr. Welton Flakes  CURRENT THERAPY: Arimidex 1 mg daily  INTERVAL HISTORY: Natasha Fletcher 79 y.o. female is here for an interim visit.  She began having worsening joint aches and fatigue a couple of weeks ago.  She has stopped the Arimidex, but has not noticed any difference in her symptoms.  She does have a history of osteoarthritis and states that she feels its getting worse.  She has a physician that manages this, and she hasn't seen him in quite some time.  She also hasn't been exercising with water aerobics like she previously did before her lumpectomy.  Otherwise, she's feeling well and w/o questions/concerns.    MEDICAL HISTORY: Past Medical History  Diagnosis Date  . Hypertension   . Asthma   . Arthritis   . Glaucoma   . Incontinence   . Wears dentures   . Wears glasses   . Thyroid disease   . GERD (gastroesophageal reflux disease)   . Cancer     rt breast    ALLERGIES:  is allergic to sulfa antibiotics.  MEDICATIONS:  Current Outpatient Prescriptions  Medication Sig Dispense Refill  . anastrozole (ARIMIDEX) 1 MG tablet Take 1 tablet (1 mg total) by mouth daily.  30 tablet  3  . calcium carbonate 200 MG capsule Take 1,200 mg by mouth 2 (two) times daily with a meal.      . Cholecalciferol (VITAMIN D-3 PO) Take 1 tablet by mouth daily. Pt. Not sure of dose      . latanoprost  (XALATAN) 0.005 % ophthalmic solution Place 1 drop into both eyes at bedtime.      Marland Kitchen lisinopril-hydrochlorothiazide (PRINZIDE,ZESTORETIC) 20-25 MG per tablet Take 1 tablet by mouth daily.      Marland Kitchen omeprazole (PRILOSEC) 20 MG capsule Take 20 mg by mouth daily as needed.      Marland Kitchen oxybutynin (DITROPAN) 5 MG tablet Take 5 mg by mouth 3 (three) times daily as needed.      . predniSONE (DELTASONE) 5 MG tablet Take 5 mg by mouth 2 (two) times daily.      . traMADol (ULTRAM) 50 MG tablet Take 50 mg by mouth. One to two tabs three times a day prn      . vitamin B-12 (CYANOCOBALAMIN) 100 MCG tablet Take 50 mcg by mouth 2 (two) times daily. Pt. Not sure of dose.      . albuterol (PROVENTIL HFA;VENTOLIN HFA) 108 (90 BASE) MCG/ACT inhaler Inhale 2 puffs into the lungs as needed.      Marland Kitchen albuterol (PROVENTIL) (2.5 MG/3ML) 0.083% nebulizer solution Take 2.5 mg by nebulization every 6 (six) hours as needed.      . fluticasone (FLOVENT HFA) 110 MCG/ACT inhaler Inhale 2 puffs into the lungs 2 (two) times daily.      . triazolam (HALCION) 0.125 MG tablet Take 0.125 mg by mouth at bedtime as needed.  No current facility-administered medications for this visit.    SURGICAL HISTORY:  Past Surgical History  Procedure Laterality Date  . Total hip arthroplasty    . Shoulder surgery    . Hand surgery    . Appendectomy    . Carpal tunnel release      rt  . Tonsillectomy    . Colonoscopy    . Abdominal hysterectomy    . Eye surgery      both cataracts  . Breast biopsy  04/27/2011    Procedure: BREAST BIOPSY WITH NEEDLE LOCALIZATION;  Surgeon: Mariella Saa, MD;  Location: [facility];  Service: General;  Laterality: Right;  right needle localized breast lumpectomy   . Breast lumpectomy  04/27/11    right breast by [facility]    REVIEW OF SYSTEMS:   General: fatigue (-), night sweats (-), fever (-), pain (-) Lymph: palpable nodes (-) HEENT: vision changes (-), mucositis (-), gum bleeding  (-), epistaxis (-) Cardiovascular: chest pain (-), palpitations (-) Pulmonary: shortness of breath (-), dyspnea on exertion (-), cough (-), hemoptysis (-) GI:  Early satiety (-), melena (-), dysphagia (-), nausea/vomiting (-), diarrhea (-) GU: dysuria (-), hematuria (-), incontinence (-) Musculoskeletal: joint swelling (-), joint pain (-), back pain (-) Neuro: weakness (-), numbness (-), headache (-), confusion (-) Skin: Rash (-), lesions (-), dryness (-) Psych: depression (-), suicidal/homicidal ideation (-), feeling of hopelessness (-)  Health Maintenance  Mammogram:02/28/12 Colonoscopy:2 years ago Bone Density Scan:2/13 Pap Smear: unknown Eye Exam: 09/13 Vitamin D Level: 07/13 (normal) Lipid Panel: 2 years ago   PHYSICAL EXAMINATION:  BP 125/71  Pulse 108  Temp(Src) 97.1 F (36.2 C) (Oral)  Resp 20  Ht 5\' 7"  (1.702 m)  Wt 172 lb (78.019 kg)  BMI 26.93 kg/m2 General: Patient is a well appearing female in no acute distress HEENT: PERRLA, sclerae anicteric no conjunctival pallor, MMM Neck: supple, no palpable adenopathy Lungs: clear to auscultation bilaterally, no wheezes, rhonchi, or rales Cardiovascular: regular rate rhythm, S1, S2, no murmurs, rubs or gallops Abdomen: Soft, non-tender, non-distended, normoactive bowel sounds, no HSM Extremities: warm and well perfused, no clubbing, cyanosis, or edema Skin: No rashes or lesions Neuro: Non-focal Right breast: healed incisional scar no masses or nipple discharge  Left breast: no masses nipple discharge. ECOG PERFORMANCE STATUS: 1 - Symptomatic but completely ambulatory  LABORATORY DATA: Lab Results  Component Value Date   WBC 6.5 06/30/2012   HGB 11.6 06/30/2012   HCT 36.6 06/30/2012   MCV 85.0 06/30/2012   PLT 207 06/30/2012      Chemistry      Component Value Date/Time   NA 142 02/29/2012 1100   NA 140 10/21/2011 1057   K 3.8 02/29/2012 1100   K 3.8 10/21/2011 1057   CL 100 02/29/2012 1100   CL 103 10/21/2011  1057   CO2 30* 02/29/2012 1100   CO2 31 10/21/2011 1057   BUN 19.0 02/29/2012 1100   BUN 20 10/21/2011 1057   CREATININE 1.1 02/29/2012 1100   CREATININE 0.97 10/21/2011 1057      Component Value Date/Time   CALCIUM 10.3 02/29/2012 1100   CALCIUM 9.9 10/21/2011 1057   ALKPHOS 92 02/29/2012 1100   ALKPHOS 65 10/21/2011 1057   AST 20 02/29/2012 1100   AST 21 10/21/2011 1057   ALT 14 02/29/2012 1100   ALT 18 10/21/2011 1057   BILITOT 0.70 02/29/2012 1100   BILITOT 0.6 10/21/2011 1057       RADIOGRAPHIC  STUDIES:  No results found.  ASSESSMENT: 79 year old female with stage I invasive ductal carcinoma of the right breast status post lumpectomy followed by antiestrogen therapy adjuvantly. Overall she's doing well tolerating it well. Her overall prognosis is I think excellent. She will continue the Arimidex for a total of 5 years risks and benefits of this were discussed with her. She has had a bone density scan performed a rate we will continue to monitor these on a regular basis.   PLAN:   #1 Since the cessation of Mrs. Dumm' arimidex didn't alleviate her joint pain and fatigue, she will restart the medication.  Should her pain or fatigue worsen, I will prescribe aromasin.    #2 We discussed survivorship and exercise.  She is going to start back doing water aerobics, and hopefully that will help with her fatigue.    #3 I will see her back in 12 months to ensure she's continuing to tolerate her aromatase inhibitor well.   All questions were answered. The patient knows to call the clinic with any problems, questions or concerns. We can certainly see the patient much sooner if necessary.  I spent 25 minutes counseling the patient face to face. The total time spent in the appointment was 30 minutes.   Drue Second, MD Medical/Oncology [facility] [phone] (beeper) [phone] (Office)  06/30/2012, 2:38 PM

## 2012-06-30 NOTE — Telephone Encounter (Signed)
gv pt appt schedule for March 2015.  °

## 2012-06-30 NOTE — Patient Instructions (Addendum)
Doing well with the anastrozole daily  I will see you back in 1 year

## 2012-08-09 ENCOUNTER — Ambulatory Visit (INDEPENDENT_AMBULATORY_CARE_PROVIDER_SITE_OTHER): Payer: BC Managed Care – HMO | Admitting: General Surgery

## 2012-08-09 ENCOUNTER — Encounter (INDEPENDENT_AMBULATORY_CARE_PROVIDER_SITE_OTHER): Payer: Self-pay | Admitting: General Surgery

## 2012-08-09 VITALS — BP 114/66 | HR 72 | Temp 97.7°F | Resp 14 | Ht 66.0 in | Wt 165.6 lb

## 2012-08-09 DIAGNOSIS — C50119 Malignant neoplasm of central portion of unspecified female breast: Secondary | ICD-10-CM

## 2012-08-09 DIAGNOSIS — C50111 Malignant neoplasm of central portion of right female breast: Secondary | ICD-10-CM

## 2012-08-09 NOTE — Progress Notes (Signed)
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 She had widely negative margins and has been on hormone therapy and no radiation. She reports she thinks the hormone pill makes her a little tired sometimes but generally is feeling well. She denies any complaints specific to her breasts such as lump or nipple discharge or skin changes.  Exam: Gen.: Elderly but healthy-appearing African American female in no distress Lymph nodes no cervical, supraclavicular, or axillary nodes palpable Breasts: Well-healed circumareolar incision on the right. There are no palpable masses in either breast particular attention to the lumpectomy site on the right.  Imaging: mammogram in November was negative  Assessment and plan: doing well with no evidence of recurrent or new breast cancer. Return in 6 months.

## 2012-09-29 ENCOUNTER — Encounter (HOSPITAL_COMMUNITY): Payer: Self-pay | Admitting: Nurse Practitioner

## 2012-09-29 ENCOUNTER — Emergency Department (HOSPITAL_COMMUNITY): Payer: Medicare Other

## 2012-09-29 ENCOUNTER — Observation Stay (HOSPITAL_COMMUNITY)
Admission: EM | Admit: 2012-09-29 | Discharge: 2012-09-30 | Disposition: A | Discharge status: Home / Self Care | Payer: Medicare Other | Attending: Internal Medicine | Admitting: Internal Medicine

## 2012-09-29 ENCOUNTER — Observation Stay (HOSPITAL_COMMUNITY): Payer: Medicare Other

## 2012-09-29 DIAGNOSIS — Z79899 Other long term (current) drug therapy: Secondary | ICD-10-CM | POA: Insufficient documentation

## 2012-09-29 DIAGNOSIS — C50119 Malignant neoplasm of central portion of unspecified female breast: Secondary | ICD-10-CM | POA: Diagnosis present

## 2012-09-29 DIAGNOSIS — E039 Hypothyroidism, unspecified: Secondary | ICD-10-CM | POA: Insufficient documentation

## 2012-09-29 DIAGNOSIS — M25569 Pain in unspecified knee: Secondary | ICD-10-CM | POA: Diagnosis present

## 2012-09-29 DIAGNOSIS — M11869 Other specified crystal arthropathies, unspecified knee: Secondary | ICD-10-CM | POA: Insufficient documentation

## 2012-09-29 DIAGNOSIS — IMO0002 Reserved for concepts with insufficient information to code with codable children: Secondary | ICD-10-CM | POA: Insufficient documentation

## 2012-09-29 DIAGNOSIS — Z96649 Presence of unspecified artificial hip joint: Secondary | ICD-10-CM | POA: Insufficient documentation

## 2012-09-29 DIAGNOSIS — I10 Essential (primary) hypertension: Secondary | ICD-10-CM

## 2012-09-29 DIAGNOSIS — Z853 Personal history of malignant neoplasm of breast: Secondary | ICD-10-CM | POA: Insufficient documentation

## 2012-09-29 DIAGNOSIS — M25561 Pain in right knee: Secondary | ICD-10-CM

## 2012-09-29 DIAGNOSIS — M1990 Unspecified osteoarthritis, unspecified site: Secondary | ICD-10-CM | POA: Diagnosis present

## 2012-09-29 DIAGNOSIS — M1710 Unilateral primary osteoarthritis, unspecified knee: Secondary | ICD-10-CM | POA: Insufficient documentation

## 2012-09-29 DIAGNOSIS — E079 Disorder of thyroid, unspecified: Secondary | ICD-10-CM | POA: Diagnosis present

## 2012-09-29 DIAGNOSIS — M129 Arthropathy, unspecified: Secondary | ICD-10-CM

## 2012-09-29 LAB — CBC WITH DIFFERENTIAL/PLATELET
Basophils Relative: 0 % (ref 0–1)
Eosinophils Absolute: 0.2 10*3/uL (ref 0.0–0.7)
Hemoglobin: 11.1 g/dL — ABNORMAL LOW (ref 12.0–15.0)
Lymphs Abs: 1.8 10*3/uL (ref 0.7–4.0)
MCH: 26.4 pg (ref 26.0–34.0)
MCHC: 32 g/dL (ref 30.0–36.0)
Neutro Abs: 3.6 10*3/uL (ref 1.7–7.7)
Neutrophils Relative %: 61 % (ref 43–77)
Platelets: 200 10*3/uL (ref 150–400)
RBC: 4.2 MIL/uL (ref 3.87–5.11)

## 2012-09-29 LAB — URINALYSIS, ROUTINE W REFLEX MICROSCOPIC
Nitrite: NEGATIVE
Protein, ur: NEGATIVE mg/dL
Specific Gravity, Urine: 1.01 (ref 1.005–1.030)
Urobilinogen, UA: 0.2 mg/dL (ref 0.0–1.0)

## 2012-09-29 LAB — COMPREHENSIVE METABOLIC PANEL
ALT: 9 U/L (ref 0–35)
Albumin: 3.8 g/dL (ref 3.5–5.2)
Alkaline Phosphatase: 92 U/L (ref 39–117)
Chloride: 100 mEq/L (ref 96–112)
Potassium: 3.4 mEq/L — ABNORMAL LOW (ref 3.5–5.1)
Sodium: 139 mEq/L (ref 135–145)
Total Bilirubin: 0.6 mg/dL (ref 0.3–1.2)
Total Protein: 7.2 g/dL (ref 6.0–8.3)

## 2012-09-29 LAB — URINE MICROSCOPIC-ADD ON

## 2012-09-29 MED ORDER — ALBUTEROL SULFATE HFA 108 (90 BASE) MCG/ACT IN AERS: 2.0000 | INHALATION_SPRAY | RESPIRATORY_TRACT | Status: DC | PRN

## 2012-09-29 MED ORDER — MORPHINE SULFATE 2 MG/ML IJ SOLN
2.0000 mg | Freq: Once | INTRAMUSCULAR | Status: DC
  Filled 2012-09-29: qty 1

## 2012-09-29 MED ORDER — TRIAZOLAM 0.125 MG PO TABS
0.1250 mg | ORAL_TABLET | Freq: Every day | ORAL | Status: DC
  Administered 2012-09-29: 0.125 mg via ORAL
  Filled 2012-09-29 (×2): qty 1

## 2012-09-29 MED ORDER — ACETAMINOPHEN 650 MG RE SUPP: 650.0000 mg | Freq: Four times a day (QID) | RECTAL | Status: DC | PRN

## 2012-09-29 MED ORDER — OXYBUTYNIN CHLORIDE 5 MG PO TABS
5.0000 mg | ORAL_TABLET | Freq: Three times a day (TID) | ORAL | Status: DC | PRN
  Filled 2012-09-29: qty 1

## 2012-09-29 MED ORDER — ONDANSETRON HCL 4 MG/2ML IJ SOLN: 4.0000 mg | Freq: Four times a day (QID) | INTRAMUSCULAR | Status: DC | PRN

## 2012-09-29 MED ORDER — MORPHINE SULFATE 2 MG/ML IJ SOLN: 2.0000 mg | INTRAMUSCULAR | Status: DC | PRN

## 2012-09-29 MED ORDER — MORPHINE SULFATE 4 MG/ML IJ SOLN
4.0000 mg | Freq: Once | INTRAMUSCULAR | Status: AC
  Administered 2012-09-29: 4 mg via INTRAVENOUS
  Filled 2012-09-29: qty 1

## 2012-09-29 MED ORDER — LISINOPRIL 20 MG PO TABS
20.0000 mg | ORAL_TABLET | Freq: Every day | ORAL | Status: DC
Start: 2012-09-30 — End: 2012-09-30
  Administered 2012-09-30: 20 mg via ORAL
  Filled 2012-09-29: qty 1

## 2012-09-29 MED ORDER — HYDROCHLOROTHIAZIDE 25 MG PO TABS
25.0000 mg | ORAL_TABLET | Freq: Every day | ORAL | Status: DC
  Administered 2012-09-30: 25 mg via ORAL
  Filled 2012-09-29: qty 1

## 2012-09-29 MED ORDER — PREDNISONE 5 MG PO TABS
5.0000 mg | ORAL_TABLET | ORAL | Status: DC
  Administered 2012-09-30: 5 mg via ORAL
  Filled 2012-09-29 (×2): qty 1

## 2012-09-29 MED ORDER — HEPARIN SODIUM (PORCINE) 5000 UNIT/ML IJ SOLN
5000.0000 [IU] | Freq: Three times a day (TID) | INTRAMUSCULAR | Status: DC
  Administered 2012-09-29 – 2012-09-30 (×2): 5000 [IU] via SUBCUTANEOUS
  Filled 2012-09-29 (×6): qty 1

## 2012-09-29 MED ORDER — ACETAMINOPHEN 325 MG PO TABS: 650.0000 mg | ORAL_TABLET | Freq: Four times a day (QID) | ORAL | Status: DC | PRN

## 2012-09-29 MED ORDER — OXYCODONE HCL 5 MG PO TABS
5.0000 mg | ORAL_TABLET | ORAL | Status: DC | PRN
  Administered 2012-09-30 (×2): 10 mg via ORAL
  Filled 2012-09-29 (×2): qty 2

## 2012-09-29 MED ORDER — ALUM & MAG HYDROXIDE-SIMETH 200-200-20 MG/5ML PO SUSP: 30.0000 mL | Freq: Four times a day (QID) | ORAL | Status: DC | PRN

## 2012-09-29 MED ORDER — LATANOPROST 0.005 % OP SOLN
1.0000 [drp] | Freq: Every day | OPHTHALMIC | Status: DC
  Administered 2012-09-29: 1 [drp] via OPHTHALMIC
  Filled 2012-09-29 (×2): qty 2.5

## 2012-09-29 MED ORDER — TRAMADOL HCL 50 MG PO TABS
50.0000 mg | ORAL_TABLET | Freq: Once | ORAL | Status: DC
  Filled 2012-09-29: qty 1

## 2012-09-29 MED ORDER — LISINOPRIL-HYDROCHLOROTHIAZIDE 20-25 MG PO TABS: 1.0000 | ORAL_TABLET | Freq: Every day | ORAL | Status: DC

## 2012-09-29 MED ORDER — SODIUM CHLORIDE 0.9 % IV SOLN
INTRAVENOUS | Status: DC
  Administered 2012-09-30: 07:00:00 via INTRAVENOUS

## 2012-09-29 MED ORDER — ANASTROZOLE 1 MG PO TABS
1.0000 mg | ORAL_TABLET | Freq: Every day | ORAL | Status: DC
  Administered 2012-09-30: 1 mg via ORAL
  Filled 2012-09-29 (×2): qty 1

## 2012-09-29 MED ORDER — MORPHINE SULFATE 2 MG/ML IJ SOLN
2.0000 mg | Freq: Once | INTRAMUSCULAR | Status: AC
  Administered 2012-09-29: 2 mg via INTRAMUSCULAR

## 2012-09-29 MED ORDER — HYDROCODONE-ACETAMINOPHEN 5-325 MG PO TABS
2.0000 | ORAL_TABLET | Freq: Once | ORAL | Status: AC
  Administered 2012-09-29: 2 via ORAL
  Filled 2012-09-29: qty 2

## 2012-09-29 MED ORDER — ONDANSETRON HCL 4 MG PO TABS: 4.0000 mg | ORAL_TABLET | Freq: Four times a day (QID) | ORAL | Status: DC | PRN

## 2012-09-29 NOTE — Progress Notes (Signed)
Unit CM UR Completed by [facility] ED CM  W. Samanthajo Payano RN  

## 2012-09-29 NOTE — ED Notes (Signed)
Returned from xray

## 2012-09-29 NOTE — ED Notes (Signed)
Family offered drinks and updated on care.

## 2012-09-29 NOTE — ED Notes (Signed)
Pt could not ambulate.4:14pm JG.

## 2012-09-29 NOTE — ED Notes (Signed)
MD at bedside. 

## 2012-09-29 NOTE — ED Notes (Signed)
Family at bedside. 

## 2012-09-29 NOTE — ED Notes (Signed)
Will ambulate after CT

## 2012-09-29 NOTE — H&P (Signed)
[facility] History and Physical  Natasha Fletcher ZOX:[id number] DOB: [birth year] DOA: 09/29/2012  Referring physician: Glynn Octave, MD PCP: Lorenda Peck, MD  Specialists: [facility]  Chief Complaint: Right knee pain  HPI: Natasha Fletcher is a 79 y.o. female with past medical history of hypertension, hypothyroidism and right breast cancer came in to the hospital because of acute right knee pain. Patient has history of osteoarthritis with previous bilateral hip replacement. She said she was in her usual state of health till today when she was trying to get out of her car, she felt severe pain in her right knee, patient denies any trauma, denies any redness, fever or any symptoms prior to that. She has chronic knee pain which is this is very different from that. Patient brought to the emergency department because she couldn't bear weight, she did receive oral medications but she was still not able to bear weight because of the pain. 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 Patient still complaining about a lot of pain and couldn't bear weight so she'll be admitted to the hospital for observation.  Review of Systems:  Constitutional: negative for anorexia, fevers and sweats Eyes: negative for irritation, redness and visual disturbance Ears, nose, mouth, throat, and face: negative for earaches, epistaxis, nasal congestion and sore throat Respiratory: negative for cough, dyspnea on exertion, sputum and wheezing Cardiovascular: negative for chest pain, dyspnea, lower extremity edema, orthopnea, palpitations and syncope Gastrointestinal: negative for abdominal pain, constipation, diarrhea, melena, nausea and vomiting Genitourinary:negative for dysuria, frequency and hematuria Hematologic/lymphatic: negative for bleeding, easy bruising and lymphadenopathy Musculoskeletal: Right knee pain per history of present  illness Neurological: negative for coordination problems, gait problems, headaches and weakness Endocrine: negative for diabetic symptoms including polydipsia, polyuria and weight loss Allergic/Immunologic: negative for anaphylaxis, hay fever and urticaria  Past Medical History  Diagnosis Date  . Hypertension   . Asthma   . Arthritis   . Glaucoma   . Incontinence   . Wears dentures   . Wears glasses   . Thyroid disease   . GERD (gastroesophageal reflux disease)   . Cancer     rt breast  . Edema leg    Past Surgical History  Procedure Laterality Date  . Total hip arthroplasty    . Shoulder surgery    . Hand surgery    . Appendectomy    . Carpal tunnel release      rt  . Tonsillectomy    . Colonoscopy    . Abdominal hysterectomy    . Eye surgery      both cataracts  . Breast biopsy  04/27/2011    Procedure: BREAST BIOPSY WITH NEEDLE LOCALIZATION;  Surgeon: Mariella Saa, MD;  Location: [facility];  Service: General;  Laterality: Right;  right needle localized breast lumpectomy   . Breast lumpectomy  04/27/11    right breast by [facility]   Social History:  reports that she quit smoking about 37 years ago. She has never used smokeless tobacco. She reports that she does not drink alcohol or use illicit drugs.   Allergies  Allergen Reactions  . Sulfa Antibiotics Other (See Comments)    Reaction many years ago-unknown    Family History  Problem Relation Age of Onset  . Heart disease Mother   . Stroke Father     Prior to Admission medications   Medication Sig Start Date End Date Taking? Authorizing Provider  albuterol (PROVENTIL HFA;VENTOLIN HFA) 108 (90 BASE) MCG/ACT inhaler Inhale 2 puffs into the lungs every 4 (four) hours as needed. For wheezing   Yes Historical Provider, MD  albuterol (PROVENTIL) (2.5 MG/3ML) 0.083% nebulizer solution Take 2.5 mg by nebulization every 6 (six) hours as needed. For wheezing   Yes Historical Provider, MD   anastrozole (ARIMIDEX) 1 MG tablet Take 1 tablet (1 mg total) by mouth daily. 06/14/12  Yes Victorino December, MD  Calcium Carbonate-Vitamin D (CALCIUM + D PO) Take 1 tablet by mouth 2 (two) times daily.   Yes Historical Provider, MD  Cholecalciferol (VITAMIN D-3 PO) Take 1 tablet by mouth daily. Pt. Not sure of dose   Yes Historical Provider, MD  Cyanocobalamin (VITAMIN B-12 PO) Take 1 tablet by mouth daily.   Yes Historical Provider, MD  latanoprost (XALATAN) 0.005 % ophthalmic solution Place 1 drop into both eyes at bedtime.   Yes Historical Provider, MD  lisinopril-hydrochlorothiazide (PRINZIDE,ZESTORETIC) 20-25 MG per tablet Take 1 tablet by mouth daily.   Yes Historical Provider, MD  oxybutynin (DITROPAN) 5 MG tablet Take 5 mg by mouth 3 (three) times daily as needed. To prevent urinary frequency   Yes Historical Provider, MD  predniSONE (DELTASONE) 5 MG tablet Take 5 mg by mouth See admin instructions. Take 5mg  every 2-3 days   Yes Historical Provider, MD  traMADol (ULTRAM) 50 MG tablet Take 50 mg by mouth 3 (three) times daily as needed. For pain   Yes Historical Provider, MD  triazolam (HALCION) 0.125 MG tablet Take 0.125 mg by mouth at bedtime.    Yes Historical Provider, MD   Physical Exam: Filed Vitals:   09/29/12 1600 09/29/12 1616 09/29/12 1714 09/29/12 1716  BP: 147/61 171/72 153/71   Pulse: 55 54  77  Temp:  98.7 F (37.1 C)    TempSrc:  Oral    Resp:  18    Height:      Weight:      SpO2: 97% 100%  98%   General appearance: alert, cooperative and no distress  Head: Normocephalic, without obvious abnormality, atraumatic  Eyes: conjunctivae/corneas clear. PERRL, EOM's intact. Fundi benign.  Nose: Nares normal. Septum midline. Mucosa normal. No drainage or sinus tenderness.  Throat: lips, mucosa, and tongue normal; teeth and gums normal  Neck: Supple, no masses, no cervical lymphadenopathy, no JVD appreciated, no meningeal signs Resp: clear to auscultation bilaterally   Chest wall: no tenderness  Cardio: regular rate and rhythm, S1, S2 normal, no murmur, click, rub or gallop  GI: soft, non-tender; bowel sounds normal; no masses, no organomegaly  Extremities: extremities normal, atraumatic, no cyanosis or edema  Skin: Skin color, texture, turgor normal. No rashes or lesions  Neurologic: Alert and oriented X 3, normal strength and tone. Normal symmetric reflexes. Normal coordination and gait  Labs on Admission:  Basic Metabolic Panel:  Recent Labs Lab 09/29/12 1640  NA 139  K 3.4*  CL 100  CO2 28  GLUCOSE 105*  BUN 16  CREATININE 0.92  CALCIUM 9.8   Liver Function Tests:  Recent Labs Lab 09/29/12 1640  AST 19  ALT 9  ALKPHOS 92  BILITOT 0.6  PROT 7.2  ALBUMIN 3.8   No results found for this basename: LIPASE, AMYLASE,  in the last 168 hours No results found for this basename: AMMONIA,  in the last 168 hours CBC:  Recent Labs Lab 09/29/12 1640  WBC 5.8  NEUTROABS 3.6  HGB 11.1*  HCT 34.7*  MCV  82.6  PLT 200   Cardiac Enzymes: No results found for this basename: CKTOTAL, CKMB, CKMBINDEX, TROPONINI,  in the last 168 hours  BNP (last 3 results) No results found for this basename: PROBNP,  in the last 8760 hours CBG: No results found for this basename: GLUCAP,  in the last 168 hours  Radiological Exams on Admission: Dg Hip Complete Right  09/29/2012   *RADIOLOGY REPORT*  Clinical Data: Inability to bear weight on the right leg predominately due to severe knee pain.  Prior right hip arthroplasty.  RIGHT HIP - COMPLETE 2+ VIEW  Comparison: Right hip x-rays 02/17/2010 [facility].  Findings: No evidence of acute or subacute fracture.  Right hip arthroplasty with anatomic alignment.  No evidence of prosthetic loosening.  Anchor screw for the acetabular cup again noted to have its tip extending beyond the cortex of the iliac bone, unchanged.  Included AP pelvis demonstrates a prior left hip arthroplasty with anatomic alignment  and no complicating features.  Sacroiliac joints and symphysis pubis intact with mild degenerative changes.  Severe degenerative changes involving the visualized lower lumbar spine.  IMPRESSION: No acute or subacute osseous abnormality.  Right hip arthroplasty with anatomic alignment. alignment.   Original Report Authenticated By: Hulan Saas, M.D.   Ct Knee Right Wo Contrast  09/29/2012   *RADIOLOGY REPORT*  Clinical Data: Osteoarthritis.  Sudden onset of pain and right knee with standing.  CT OF THE RIGHT KNEE WITHOUT CONTRAST  Technique:  Multidetector CT imaging was performed according to the standard protocol. Multiplanar CT image reconstructions were also generated.  Comparison: 09/29/2012 radiographs  Findings: Prominent chondrocalcinosis noted with severe tri- compartmental spurring, marked loss of articular space in the medial compartment, and markedly severe loss of patellofemoral articular space with a flattened patella and interdigitating with the third femoral trochlea any sawtooth pattern as shown on image 57 of series 3.  Moderate knee effusion.  Capsular calcifications noted posteromedially.  There is calcification along the articular cartilage surfaces.  Cannot exclude small free osteochondral fragments loose within the joint.  No specific fracture observed.  Subcortical sclerosis noted in the medial compartment.  IMPRESSION:  1.  Severe CPPD arthropathy. 2.  Chronic sawtooth pattern interdigitation of the flattened patella with the femoral trochlear groove.  Chronic lateral patellar subluxation. 3.  Moderate knee effusion. 4.  Cannot exclude free osteochondral fragments given the degree of capsular calcification. 5.  No acute fracture identified.  If symptoms persist despite conservative therapy, MRI followup may be warranted.   Original Report Authenticated By: Gaylyn Rong, M.D.   Dg Knee Complete 4 Views Right  09/29/2012   *RADIOLOGY REPORT*  Clinical Data: Acute severe pain in  the right the earlier today while trying to get out of her automobile.  RIGHT KNEE - COMPLETE 4+ VIEW  Comparison: None.  Findings: No evidence of acute or subacute fracture.  Severe tricompartment joint space narrowing, worst in the patellofemoral compartment.  Extensive chondrocalcinosis involving the medial and lateral menisci.  Patella alta.  Small joint effusion.  Relatively well-preserved bone mineral density for age.  IMPRESSION: Severe tricompartment osteoarthritis secondary to CPPD.  Patella alta.  Small joint effusion.   Original Report Authenticated By: Hulan Saas, M.D.    EKG: Independently reviewed.   Assessment/Plan Principal Problem:   Knee pain, acute Active Problems:   Cancer of central portion of female breast   Hypertension   Arthritis   Thyroid disease   Acute right knee pain -Patient has chronic osteoarthritis, CT  scan showed severe tricompartmental osteoarthritis secondary to CPPD. -No evidence of acute fracture, but cannot exclude osteochondral fragments given the degree of the capsular calcification. -Recommended MRI, MRI to be done to rule out occult fractures. -Control pain medications with narcotics. -Dr. Lequita Halt of orthopedics evaluated the patient.  Hypertension -Blood pressure seems to be okay. -Continue preadmission home medications, patient was on lisinopril/HCTZ.  Hypothyroidism -Patient is on Synthroid, continue preadmission Synthroid. Check TSH.  History of breast cancer -Stable, patient is on Arimidex, continue.   Code Status: Full code Family Communication: Plan discussed with the patient Disposition Plan: Observation, MedSurg.  Time spent: 70 minutes  [facility] A [facility] Pager [phone]  If 7PM-7AM, please contact night-coverage [email or web address] Password [facility] 09/29/2012, 5:39 PM

## 2012-09-29 NOTE — ED Notes (Signed)
Per EMS pt walks with cane independently. Pt drove to water aerobics class and got out of car and had excruciating pain in right knee. Pt has hx bilateral knee arthritis.

## 2012-09-29 NOTE — ED Provider Notes (Signed)
History    CSN: [id number] Arrival date & time 09/29/12  1120  First MD Initiated Contact with Patient 09/29/12 1122     Chief Complaint  Patient presents with  . Knee Pain   (Consider location/radiation/quality/duration/timing/severity/associated sxs/prior Treatment) HPI Comments: Patient presents with severe pain in her right knee and leg after trying to get out of the car. She reports a history of severe arthritis in her knees but did not have pain until she tried to step out of her car. Denies any fall or direct trauma. Denies pain any other joints. Denies any head, neck, back, chest or abdominal pain. She is unable to bear weight.  The history is provided by the patient and the EMS personnel.   Past Medical History  Diagnosis Date  . Hypertension   . Asthma   . Arthritis   . Glaucoma   . Incontinence   . Wears dentures   . Wears glasses   . Thyroid disease   . GERD (gastroesophageal reflux disease)   . Cancer     rt breast  . Edema leg    Past Surgical History  Procedure Laterality Date  . Total hip arthroplasty    . Shoulder surgery    . Hand surgery    . Appendectomy    . Carpal tunnel release      rt  . Tonsillectomy    . Colonoscopy    . Abdominal hysterectomy    . Eye surgery      both cataracts  . Breast biopsy  04/27/2011    Procedure: BREAST BIOPSY WITH NEEDLE LOCALIZATION;  Surgeon: Mariella Saa, MD;  Location: [facility];  Service: General;  Laterality: Right;  right needle localized breast lumpectomy   . Breast lumpectomy  04/27/11    right breast by [facility]   Family History  Problem Relation Age of Onset  . Heart disease Mother   . Stroke Father    History  Substance Use Topics  . Smoking status: Former Smoker -- 0.50 packs/day    Quit date: 04/21/1975  . Smokeless tobacco: Never Used  . Alcohol Use: No   OB History   Grav Para Term Preterm Abortions TAB SAB Ect Mult Living                 Review of Systems   Constitutional: Negative for fever, activity change and appetite change.  HENT: Negative for congestion and rhinorrhea.   Respiratory: Negative for cough, chest tightness and shortness of breath.   Cardiovascular: Negative for chest pain.  Gastrointestinal: Negative for nausea, vomiting and abdominal pain.  Genitourinary: Negative for dysuria and hematuria.  Musculoskeletal: Positive for myalgias, arthralgias and gait problem. Negative for back pain.  Skin: Negative for wound.  Neurological: Negative for dizziness and headaches.  A complete 10 system review of systems was obtained and all systems are negative except as noted in the HPI and PMH.    Allergies  Sulfa antibiotics  Home Medications   Current Outpatient Rx  Name  Route  Sig  Dispense  Refill  . albuterol (PROVENTIL HFA;VENTOLIN HFA) 108 (90 BASE) MCG/ACT inhaler   Inhalation   Inhale 2 puffs into the lungs every 4 (four) hours as needed. For wheezing         . albuterol (PROVENTIL) (2.5 MG/3ML) 0.083% nebulizer solution   Nebulization   Take 2.5 mg by nebulization every 6 (six) hours as needed. For wheezing         .  anastrozole (ARIMIDEX) 1 MG tablet   Oral   Take 1 tablet (1 mg total) by mouth daily.   30 tablet   3   . Calcium Carbonate-Vitamin D (CALCIUM + D PO)   Oral   Take 1 tablet by mouth 2 (two) times daily.         . Cholecalciferol (VITAMIN D-3 PO)   Oral   Take 1 tablet by mouth daily. Pt. Not sure of dose         . Cyanocobalamin (VITAMIN B-12 PO)   Oral   Take 1 tablet by mouth daily.         Marland Kitchen latanoprost (XALATAN) 0.005 % ophthalmic solution   Both Eyes   Place 1 drop into both eyes at bedtime.         Marland Kitchen lisinopril-hydrochlorothiazide (PRINZIDE,ZESTORETIC) 20-25 MG per tablet   Oral   Take 1 tablet by mouth daily.         Marland Kitchen oxybutynin (DITROPAN) 5 MG tablet   Oral   Take 5 mg by mouth 3 (three) times daily as needed. To prevent urinary frequency         .  predniSONE (DELTASONE) 5 MG tablet   Oral   Take 5 mg by mouth See admin instructions. Take 5mg  every 2-3 days         . traMADol (ULTRAM) 50 MG tablet   Oral   Take 50 mg by mouth 3 (three) times daily as needed. For pain         . triazolam (HALCION) 0.125 MG tablet   Oral   Take 0.125 mg by mouth at bedtime.           BP 153/71  Pulse 77  Temp(Src) 98.7 F (37.1 C) (Oral)  Resp 18  Ht 5\' 7"  (1.702 m)  Wt 162 lb (73.483 kg)  BMI 25.37 kg/m2  SpO2 98% Physical Exam  Constitutional: She is oriented to person, place, and time. She appears well-developed and well-nourished. No distress.  HENT:  Head: Normocephalic and atraumatic.  Mouth/Throat: Oropharynx is clear and moist. No oropharyngeal exudate.  Eyes: Conjunctivae and EOM are normal. Pupils are equal, round, and reactive to light.  Neck: Normal range of motion. Neck supple.  Cardiovascular: Normal rate and regular rhythm.   No murmur heard. Pulmonary/Chest: Effort normal and breath sounds normal. No respiratory distress.  Abdominal: Soft. There is no tenderness. There is no rebound and no guarding.  Musculoskeletal: She exhibits tenderness.  Arthritic deformities of both knees bilaterally. Tenderness palpation of her right patella and distal femur. Reduced flexion and extension secondary to pain. +2 DP and PT pulses. No pain with range of motion of right hip Flexion and extension intact, no ligament laxity. No palpable step-offs to patellar or quad tendons  Neurological: She is alert and oriented to person, place, and time. No cranial nerve deficit. She exhibits normal muscle tone. Coordination normal.  Skin: Skin is warm.    ED Course  Procedures (including critical care time) Labs Reviewed  CBC WITH DIFFERENTIAL - Abnormal; Notable for the following:    Hemoglobin 11.1 (*)    HCT 34.7 (*)    All other components within normal limits  COMPREHENSIVE METABOLIC PANEL - Abnormal; Notable for the following:     Potassium 3.4 (*)    Glucose, Bld 105 (*)    GFR calc non Af Amer 56 (*)    GFR calc Af Amer 64 (*)    All other components  within normal limits  PROTIME-INR   Dg Hip Complete Right  09/29/2012   *RADIOLOGY REPORT*  Clinical Data: Inability to bear weight on the right leg predominately due to severe knee pain.  Prior right hip arthroplasty.  RIGHT HIP - COMPLETE 2+ VIEW  Comparison: Right hip x-rays 02/17/2010 [facility].  Findings: No evidence of acute or subacute fracture.  Right hip arthroplasty with anatomic alignment.  No evidence of prosthetic loosening.  Anchor screw for the acetabular cup again noted to have its tip extending beyond the cortex of the iliac bone, unchanged.  Included AP pelvis demonstrates a prior left hip arthroplasty with anatomic alignment and no complicating features.  Sacroiliac joints and symphysis pubis intact with mild degenerative changes.  Severe degenerative changes involving the visualized lower lumbar spine.  IMPRESSION: No acute or subacute osseous abnormality.  Right hip arthroplasty with anatomic alignment. alignment.   Original Report Authenticated By: Hulan Saas, M.D.   Ct Knee Right Wo Contrast  09/29/2012   *RADIOLOGY REPORT*  Clinical Data: Osteoarthritis.  Sudden onset of pain and right knee with standing.  CT OF THE RIGHT KNEE WITHOUT CONTRAST  Technique:  Multidetector CT imaging was performed according to the standard protocol. Multiplanar CT image reconstructions were also generated.  Comparison: 09/29/2012 radiographs  Findings: Prominent chondrocalcinosis noted with severe tri- compartmental spurring, marked loss of articular space in the medial compartment, and markedly severe loss of patellofemoral articular space with a flattened patella and interdigitating with the third femoral trochlea any sawtooth pattern as shown on image 57 of series 3.  Moderate knee effusion.  Capsular calcifications noted posteromedially.  There is calcification  along the articular cartilage surfaces.  Cannot exclude small free osteochondral fragments loose within the joint.  No specific fracture observed.  Subcortical sclerosis noted in the medial compartment.  IMPRESSION:  1.  Severe CPPD arthropathy. 2.  Chronic sawtooth pattern interdigitation of the flattened patella with the femoral trochlear groove.  Chronic lateral patellar subluxation. 3.  Moderate knee effusion. 4.  Cannot exclude free osteochondral fragments given the degree of capsular calcification. 5.  No acute fracture identified.  If symptoms persist despite conservative therapy, MRI followup may be warranted.   Original Report Authenticated By: Gaylyn Rong, M.D.   Dg Knee Complete 4 Views Right  09/29/2012   *RADIOLOGY REPORT*  Clinical Data: Acute severe pain in the right the earlier today while trying to get out of her automobile.  RIGHT KNEE - COMPLETE 4+ VIEW  Comparison: None.  Findings: No evidence of acute or subacute fracture.  Severe tricompartment joint space narrowing, worst in the patellofemoral compartment.  Extensive chondrocalcinosis involving the medial and lateral menisci.  Patella alta.  Small joint effusion.  Relatively well-preserved bone mineral density for age.  IMPRESSION: Severe tricompartment osteoarthritis secondary to CPPD.  Patella alta.  Small joint effusion.   Original Report Authenticated By: Hulan Saas, M.D.   1. Hypertension   2. Arthritis   3. Thyroid disease   4. Knee pain, acute, right     MDM  Atraumatic right knee pain while trying to get out of the car. History of severe arthritis. Denies any hip, back, abdominal pain. Denies fall. No fever, redness. No pain in other joints.  X-ray right hip and knee are negative for fracture. Severe arthritis.  Patient states her pain is much better after Norco. We'll attempt ambulation.  Patient remains unable to bear weight or walk. She reports she has no pain when at rest. She has  had Norco as well as  morphine. She cannot put any weight on the right knee. CT shows severe arthropathy with moderate effusion, no fracture.  She is not safe to go home as she has only assistance from her elderly sister. She'll need admission for pain control and physical therapy. Discussed with her orthopedic doctor, Dr. Lequita Halt who will see her in consult. Dr. Arthor Captain to admit.  Glynn Octave, MD 09/29/12 [phone]

## 2012-09-30 LAB — BASIC METABOLIC PANEL
BUN: 14 mg/dL (ref 6–23)
Calcium: 9.7 mg/dL (ref 8.4–10.5)
GFR calc Af Amer: 71 mL/min — ABNORMAL LOW (ref >90)
GFR calc non Af Amer: 61 mL/min — ABNORMAL LOW (ref >90)
Potassium: 3.6 mEq/L (ref 3.5–5.1)
Sodium: 139 mEq/L (ref 135–145)

## 2012-09-30 LAB — CBC
Hemoglobin: 10.6 g/dL — ABNORMAL LOW (ref 12.0–15.0)
MCHC: 32.9 g/dL (ref 30.0–36.0)
Platelets: 183 10*3/uL (ref 150–400)
RBC: 3.91 MIL/uL (ref 3.87–5.11)

## 2012-09-30 LAB — TSH: TSH: 2.413 u[IU]/mL (ref 0.350–4.500)

## 2012-09-30 MED ORDER — LIDOCAINE HCL (PF) 1 % IJ SOLN
5.0000 mL | Freq: Once | INTRAMUSCULAR | Status: DC
Start: 2012-09-30 — End: 2012-09-30
  Filled 2012-09-30: qty 5

## 2012-09-30 MED ORDER — METHYLPREDNISOLONE ACETATE 80 MG/ML IJ SUSP
80.0000 mg | Freq: Once | INTRAMUSCULAR | Status: DC
  Filled 2012-09-30: qty 2

## 2012-09-30 NOTE — Consult Note (Signed)
Reason for Consult: Right knee pain; inability to ambulate Referring Physician: Dr. Cristie Hem Callens is an 79 y.o. female.  HPI: Natasha Fletcher is well known to me from previous hip replacements and from long term non-operative management of her arthritic knees. She has received intermittent injections for many years now and tolerated them well with good pain relief and has maintained her function. Yesterday she was stepping out of her car and had immediate onset of right knee pain and inability to bear weight. She has not had any recent falls or recent changes in function in regard to her knees. Her last injections were several months ago. She is not having much pain at rest but has pain with weight bearing. She had a CT and MRI yesterday showing severe OA but no stress fracture/stress reaction. I was consulted to assist with management.  Past Medical History  Diagnosis Date  . Hypertension   . Asthma   . Arthritis   . Glaucoma   . Incontinence   . Wears dentures   . Wears glasses   . Thyroid disease   . GERD (gastroesophageal reflux disease)   . Cancer     rt breast  . Edema leg     Past Surgical History  Procedure Laterality Date  . Total hip arthroplasty    . Shoulder surgery    . Hand surgery    . Appendectomy    . Carpal tunnel release      rt  . Tonsillectomy    . Colonoscopy    . Abdominal hysterectomy    . Eye surgery      both cataracts  . Breast biopsy  04/27/2011    Procedure: BREAST BIOPSY WITH NEEDLE LOCALIZATION;  Surgeon: Mariella Saa, MD;  Location: [facility];  Service: General;  Laterality: Right;  right needle localized breast lumpectomy   . Breast lumpectomy  04/27/11    right breast by [facility]    Family History  Problem Relation Age of Onset  . Heart disease Mother   . Stroke Father     Social History:  reports that she quit smoking about 37 years ago. She has never used smokeless tobacco. She reports that she does not drink  alcohol or use illicit drugs.  Allergies:  Allergies  Allergen Reactions  . Sulfa Antibiotics Other (See Comments)    Reaction many years ago-unknown    Medications: I have reviewed the patient's current medications.  Results for orders placed during the hospital encounter of 09/29/12 (from the past 48 hour(s))  CBC WITH DIFFERENTIAL     Status: Abnormal   Collection Time    09/29/12  4:40 PM      Result Value Range   WBC 5.8  4.0 - 10.5 K/uL   RBC 4.20  3.87 - 5.11 MIL/uL   Hemoglobin 11.1 (*) 12.0 - 15.0 g/dL   HCT 10.2 (*) 72.5 - 36.6 %   MCV 82.6  78.0 - 100.0 fL   MCH 26.4  26.0 - 34.0 pg   MCHC 32.0  30.0 - 36.0 g/dL   RDW 44.0  34.7 - 42.5 %   Platelets 200  150 - 400 K/uL   Neutrophils Relative % 61  43 - 77 %   Neutro Abs 3.6  1.7 - 7.7 K/uL   Lymphocytes Relative 31  12 - 46 %   Lymphs Abs 1.8  0.7 - 4.0 K/uL   Monocytes Relative 5  3 - 12 %  Monocytes Absolute 0.3  0.1 - 1.0 K/uL   Eosinophils Relative 3  0 - 5 %   Eosinophils Absolute 0.2  0.0 - 0.7 K/uL   Basophils Relative 0  0 - 1 %   Basophils Absolute 0.0  0.0 - 0.1 K/uL  COMPREHENSIVE METABOLIC PANEL     Status: Abnormal   Collection Time    09/29/12  4:40 PM      Result Value Range   Sodium 139  135 - 145 mEq/L   Potassium 3.4 (*) 3.5 - 5.1 mEq/L   Chloride 100  96 - 112 mEq/L   CO2 28  19 - 32 mEq/L   Glucose, Bld 105 (*) 70 - 99 mg/dL   BUN 16  6 - 23 mg/dL   Creatinine, Ser 1.61  0.50 - 1.10 mg/dL   Calcium 9.8  8.4 - 09.6 mg/dL   Total Protein 7.2  6.0 - 8.3 g/dL   Albumin 3.8  3.5 - 5.2 g/dL   AST 19  0 - 37 U/L   ALT 9  0 - 35 U/L   Alkaline Phosphatase 92  39 - 117 U/L   Total Bilirubin 0.6  0.3 - 1.2 mg/dL   GFR calc non Af Amer 56 (*) >90 mL/min   GFR calc Af Amer 64 (*) >90 mL/min   Comment:            The eGFR has been calculated     using the CKD EPI equation.     This calculation has not been     validated in all clinical     situations.     eGFR's persistently     <90  mL/min signify     possible Chronic Kidney Disease.  PROTIME-INR     Status: None   Collection Time    09/29/12  4:40 PM      Result Value Range   Prothrombin Time 12.8  11.6 - 15.2 seconds   INR 0.98  0.00 - 1.49  TSH     Status: None   Collection Time    09/29/12  8:00 PM      Result Value Range   TSH 2.413  0.350 - 4.500 uIU/mL  URINALYSIS, ROUTINE W REFLEX MICROSCOPIC     Status: Abnormal   Collection Time    09/29/12  9:41 PM      Result Value Range   Color, Urine YELLOW  YELLOW   APPearance CLEAR  CLEAR   Specific Gravity, Urine 1.010  1.005 - 1.030   pH 7.5  5.0 - 8.0   Glucose, UA NEGATIVE  NEGATIVE mg/dL   Hgb urine dipstick SMALL (*) NEGATIVE   Bilirubin Urine NEGATIVE  NEGATIVE   Ketones, ur NEGATIVE  NEGATIVE mg/dL   Protein, ur NEGATIVE  NEGATIVE mg/dL   Urobilinogen, UA 0.2  0.0 - 1.0 mg/dL   Nitrite NEGATIVE  NEGATIVE   Leukocytes, UA MODERATE (*) NEGATIVE  URINE MICROSCOPIC-ADD ON     Status: None   Collection Time    09/29/12  9:41 PM      Result Value Range   Squamous Epithelial / LPF RARE  RARE   WBC, UA 3-6  <3 WBC/hpf   RBC / HPF 0-2  <3 RBC/hpf   Bacteria, UA RARE  RARE  BASIC METABOLIC PANEL     Status: Abnormal   Collection Time    09/30/12  4:20 AM      Result Value Range   Sodium  139  135 - 145 mEq/L   Potassium 3.6  3.5 - 5.1 mEq/L   Chloride 104  96 - 112 mEq/L   CO2 27  19 - 32 mEq/L   Glucose, Bld 101 (*) 70 - 99 mg/dL   BUN 14  6 - 23 mg/dL   Creatinine, Ser 1.61  0.50 - 1.10 mg/dL   Calcium 9.7  8.4 - 09.6 mg/dL   GFR calc non Af Amer 61 (*) >90 mL/min   GFR calc Af Amer 71 (*) >90 mL/min   Comment:            The eGFR has been calculated     using the CKD EPI equation.     This calculation has not been     validated in all clinical     situations.     eGFR's persistently     <90 mL/min signify     possible Chronic Kidney Disease.  CBC     Status: Abnormal   Collection Time    09/30/12  4:20 AM      Result Value Range    WBC 5.0  4.0 - 10.5 K/uL   RBC 3.91  3.87 - 5.11 MIL/uL   Hemoglobin 10.6 (*) 12.0 - 15.0 g/dL   HCT 04.5 (*) 40.9 - 81.1 %   MCV 82.4  78.0 - 100.0 fL   MCH 27.1  26.0 - 34.0 pg   MCHC 32.9  30.0 - 36.0 g/dL   RDW 91.4  78.2 - 95.6 %   Platelets 183  150 - 400 K/uL    Dg Hip Complete Right  09/29/2012   *RADIOLOGY REPORT*  Clinical Data: Inability to bear weight on the right leg predominately due to severe knee pain.  Prior right hip arthroplasty.  RIGHT HIP - COMPLETE 2+ VIEW  Comparison: Right hip x-rays 02/17/2010 [facility].  Findings: No evidence of acute or subacute fracture.  Right hip arthroplasty with anatomic alignment.  No evidence of prosthetic loosening.  Anchor screw for the acetabular cup again noted to have its tip extending beyond the cortex of the iliac bone, unchanged.  Included AP pelvis demonstrates a prior left hip arthroplasty with anatomic alignment and no complicating features.  Sacroiliac joints and symphysis pubis intact with mild degenerative changes.  Severe degenerative changes involving the visualized lower lumbar spine.  IMPRESSION: No acute or subacute osseous abnormality.  Right hip arthroplasty with anatomic alignment. alignment.   Original Report Authenticated By: Hulan Saas, M.D.   Ct Knee Right Wo Contrast  09/29/2012   *RADIOLOGY REPORT*  Clinical Data: Osteoarthritis.  Sudden onset of pain and right knee with standing.  CT OF THE RIGHT KNEE WITHOUT CONTRAST  Technique:  Multidetector CT imaging was performed according to the standard protocol. Multiplanar CT image reconstructions were also generated.  Comparison: 09/29/2012 radiographs  Findings: Prominent chondrocalcinosis noted with severe tri- compartmental spurring, marked loss of articular space in the medial compartment, and markedly severe loss of patellofemoral articular space with a flattened patella and interdigitating with the third femoral trochlea any sawtooth pattern as shown on image  57 of series 3.  Moderate knee effusion.  Capsular calcifications noted posteromedially.  There is calcification along the articular cartilage surfaces.  Cannot exclude small free osteochondral fragments loose within the joint.  No specific fracture observed.  Subcortical sclerosis noted in the medial compartment.  IMPRESSION:  1.  Severe CPPD arthropathy. 2.  Chronic sawtooth pattern interdigitation of the flattened patella with the  femoral trochlear groove.  Chronic lateral patellar subluxation. 3.  Moderate knee effusion. 4.  Cannot exclude free osteochondral fragments given the degree of capsular calcification. 5.  No acute fracture identified.  If symptoms persist despite conservative therapy, MRI followup may be warranted.   Original Report Authenticated By: Gaylyn Rong, M.D.   Mr Knee Right Wo Contrast  09/29/2012   *RADIOLOGY REPORT*  Clinical Data:  Pain after exiting automobile.  Arthritis.  MRI OF THE RIGHT KNEE WITHOUT CONTRAST  Technique:  Multiplanar, multisequence MR imaging of the  knee was performed.  No intravenous contrast was administered.  Comparison:  09/29/2012  FINDINGS: MENISCI Medial:  Heterogeneity attributed to chondrocalcinosis. Lateral:  Complex degenerative tearing in the mid body and anterior horn.  LIGAMENTS Cruciates:  Intact Collaterals:  Low-level edema tracks adjacent to the MCL.  Abnormal thickened and edematous appearance of the proximal fibular collateral ligament with low-level adjacent edema suggesting partial tear.  Popliteus tendon intact.  CARTILAGE Patellofemoral:  Full-thickness loss of articular cartilage observed, with flattening and remodeling of both the patella and the femoral trochlear groove laterally any sawtooth interlocking pattern as shown on the CT scan.  However, currently the patella is separated by about 7 mm from the femoral condyle with joint fluid. Medial:  Moderate to marked degenerative articular cartilage thinning Lateral:  Marked chondral  thinning.  Joint:  Moderate knee effusion.  Mildly thickened medial plica. 1.3 x 0.5 by 0.4 cm filling defect posterior to medial femoral condyle, favor free chondral fragment over synovitis.  Mild synovitis along the suprapatellar bursa. Popliteal Fossa:  Intact Extensor Mechanism:  Patellofemoral articulation with indicated to dating vertical groups, patellar flattening, and lateral femoral trochlear flattening as noted above.  Otherwise intact. Bones: Degenerative subcortical cyst or geode along the anteromedial rim of the medial tibial plateau.  Small geode along the tibial spine.  Prominent marginal spurring in the medial and lateral compartments.  IMPRESSION:  1.  Partial tear or grade II sprain of the fibular collateral ligament.  Potential grade 1 sprain of the medial collateral ligament. 2.  Irregular interdigitating soft to the articulation of the patella with the lateral femoral condyle, with flattening of both surfaces, full-thickness loss of associated articular cartilage, and moderate knee effusion. 3.  Mild synovitis with thickened medial plica. 4.  Suspected free chondral fragment posteromedially in the knee joint. 5.  Advanced degenerative tearing of the mid body and anterior horn lateral meniscus. 6.  Known chondrocalcinosis with severe marginal spurring in the medial and lateral compartments, compatible with CPPD arthropathy.   Original Report Authenticated By: Gaylyn Rong, M.D.   Dg Knee Complete 4 Views Right  09/29/2012   *RADIOLOGY REPORT*  Clinical Data: Acute severe pain in the right the earlier today while trying to get out of her automobile.  RIGHT KNEE - COMPLETE 4+ VIEW  Comparison: None.  Findings: No evidence of acute or subacute fracture.  Severe tricompartment joint space narrowing, worst in the patellofemoral compartment.  Extensive chondrocalcinosis involving the medial and lateral menisci.  Patella alta.  Small joint effusion.  Relatively well-preserved bone mineral  density for age.  IMPRESSION: Severe tricompartment osteoarthritis secondary to CPPD.  Patella alta.  Small joint effusion.   Original Report Authenticated By: Hulan Saas, M.D.    Review of Systems  HENT: Negative for neck pain.   Musculoskeletal: Positive for joint pain. Negative for myalgias, back pain and falls.  All other systems reviewed and are negative.   Blood pressure 121/61, pulse 60,  temperature 98.3 F (36.8 C), temperature source Oral, resp. rate 18, height 5\' 7"  (1.702 m), weight 162 lb (73.483 kg), SpO2 100.00%. Physical Exam Physical Examination: General appearance - alert, well appearing, and in no distress Mental status - alert, oriented to person, place, and time Chest - clear to auscultation, no wheezes, rales or rhonchi, symmetric air entry Heart - normal rate, regular rhythm, normal S1, S2, no murmurs, rubs, clicks or gallops Abdomen - soft, nontender, nondistended, no masses or organomegaly Right knee with moderate effusion; no warmth or erythema; no point tenderness; significant crepitus on ROM; no instability  Procedure- After a sterile prep with Betadine, I infiltrated the subQ tissues of the right knee with 3 ml of 1% Lidocaine and aspirated 40 ml of synovial fluid which was blood tinged with no signs of infection. I then injected with 80 mg (2 ml) of Depomedrol  Assessment/Plan: Right knee pain- She has severe end stage OA and has a flare with an inflammatory effusion. Fortunately she did not have a fracture with her recent increase in weight bearing pain. She will hopefully feel a lot better post-aspiration and injection. Recommend PT eval to get her ambulating WBAT RLE. Will sign off. Have her follow up in office in approximately 2 weeks  Maezie Justin V 09/30/2012, 7:45 AM

## 2012-09-30 NOTE — Discharge Summary (Signed)
Physician Discharge Summary  Natasha Fletcher HQI:[id number] DOB: [birth year] DOA: 09/29/2012  PCP: Lorenda Peck, MD  Admit date: 09/29/2012 Discharge date: 09/30/2012  Time spent: 30 minutes  Recommendations for Outpatient Follow-up:  1. Follow up with PCP in 1-2 weekd  Discharge Diagnoses:  Principal Problem:   Knee pain, acute Active Problems:   Cancer of central portion of female breast   Hypertension   Arthritis   Thyroid disease   Discharge Condition: Improved  Diet recommendation: Regular  Filed Weights   09/29/12 1124  Weight: 73.483 kg (162 lb)    History of present illness:  Natasha Fletcher is a 79 y.o. female with past medical history of hypertension, hypothyroidism and right breast cancer came in to the hospital because of acute right knee pain. Patient has history of osteoarthritis with previous bilateral hip replacement. She said she was in her usual state of health till today when she was trying to get out of her car, she felt severe pain in her right knee, patient denies any trauma, denies any redness, fever or any symptoms prior to that. She has chronic knee pain which is this is very different from that. Patient brought to the emergency department because she couldn't bear weight, she did receive oral medications but she was still not able to bear weight because of the pain. 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 Patient still complaining about a lot of pain and couldn't bear weight so she'll be admitted to the hospital for observation.  Hospital Course:  The patient was seen by her Orthopod, Dr. Lequita Halt, where she had fluid aspirated from her R knee with steroid injection. The patient noted much improvement. The patient will be discharged home with home PT/OT and with a rolling walker to aid in ambulation. The patient has been instructed to abstain from driving until cleared by her  provider.  Procedures:  Steroid injection with 40cc synovial fluid aspirated from R knee 09/30/12  Consultations:  Dr. Lequita Halt - Orthopedic surgery  Discharge Exam: Filed Vitals:   09/29/12 1716 09/29/12 1934 09/29/12 2213 09/30/12 0522  BP:  169/57 140/66 121/61  Pulse: 77 73 73 60  Temp:  97.3 F (36.3 C) 98.3 F (36.8 C) 98.3 F (36.8 C)  TempSrc:  Oral    Resp:  18 18 18   Height:      Weight:      SpO2: 98% 99% 99% 100%    General: Awake, in nad Cardiovascular: regular, s1, s2 Respiratory: normal resp effort, no wheezing  Discharge Instructions   Future Appointments Provider Department Dept Phone   06/28/2013 8:30 AM Mauri [facility] MEDICAL ONCOLOGY [phone]   06/28/2013 9:00 AM Victorino December, MD [facility] MEDICAL ONCOLOGY [phone]       Medication List         albuterol (2.5 MG/3ML) 0.083% nebulizer solution  Commonly known as:  PROVENTIL  Take 2.5 mg by nebulization every 6 (six) hours as needed. For wheezing     albuterol 108 (90 BASE) MCG/ACT inhaler  Commonly known as:  PROVENTIL HFA;VENTOLIN HFA  Inhale 2 puffs into the lungs every 4 (four) hours as needed. For wheezing     anastrozole 1 MG tablet  Commonly known as:  ARIMIDEX  Take 1 tablet (1 mg total) by mouth daily.     CALCIUM + D PO  Take 1 tablet by mouth 2 (two)  times daily.     latanoprost 0.005 % ophthalmic solution  Commonly known as:  XALATAN  Place 1 drop into both eyes at bedtime.     lisinopril-hydrochlorothiazide 20-25 MG per tablet  Commonly known as:  PRINZIDE,ZESTORETIC  Take 1 tablet by mouth daily.     oxybutynin 5 MG tablet  Commonly known as:  DITROPAN  Take 5 mg by mouth 3 (three) times daily as needed. To prevent urinary frequency     predniSONE 5 MG tablet  Commonly known as:  DELTASONE  Take 5 mg by mouth See admin instructions. Take 5mg  every 2-3 days     traMADol 50 MG tablet  Commonly known as:  ULTRAM   Take 50 mg by mouth 3 (three) times daily as needed. For pain     triazolam 0.125 MG tablet  Commonly known as:  HALCION  Take 0.125 mg by mouth at bedtime.     VITAMIN B-12 PO  Take 1 tablet by mouth daily.     VITAMIN D-3 PO  Take 1 tablet by mouth daily. Pt. Not sure of dose       Allergies  Allergen Reactions  . Sulfa Antibiotics Other (See Comments)    Reaction many years ago-unknown       Follow-up Information   Schedule an appointment as soon as possible for a visit with ROBERTS, Vernie Ammons, MD.   Contact information:   [address] [place] [address] [phone]        The results of significant diagnostics from this hospitalization (including imaging, microbiology, ancillary and laboratory) are listed below for reference.    Significant Diagnostic Studies: Dg Hip Complete Right  09/29/2012   *RADIOLOGY REPORT*  Clinical Data: Inability to bear weight on the right leg predominately due to severe knee pain.  Prior right hip arthroplasty.  RIGHT HIP - COMPLETE 2+ VIEW  Comparison: Right hip x-rays 02/17/2010 [facility].  Findings: No evidence of acute or subacute fracture.  Right hip arthroplasty with anatomic alignment.  No evidence of prosthetic loosening.  Anchor screw for the acetabular cup again noted to have its tip extending beyond the cortex of the iliac bone, unchanged.  Included AP pelvis demonstrates a prior left hip arthroplasty with anatomic alignment and no complicating features.  Sacroiliac joints and symphysis pubis intact with mild degenerative changes.  Severe degenerative changes involving the visualized lower lumbar spine.  IMPRESSION: No acute or subacute osseous abnormality.  Right hip arthroplasty with anatomic alignment. alignment.   Original Report Authenticated By: Hulan Saas, M.D.   Ct Knee Right Wo Contrast  09/29/2012   *RADIOLOGY REPORT*  Clinical Data: Osteoarthritis.  Sudden onset of pain and right knee with  standing.  CT OF THE RIGHT KNEE WITHOUT CONTRAST  Technique:  Multidetector CT imaging was performed according to the standard protocol. Multiplanar CT image reconstructions were also generated.  Comparison: 09/29/2012 radiographs  Findings: Prominent chondrocalcinosis noted with severe tri- compartmental spurring, marked loss of articular space in the medial compartment, and markedly severe loss of patellofemoral articular space with a flattened patella and interdigitating with the third femoral trochlea any sawtooth pattern as shown on image 57 of series 3.  Moderate knee effusion.  Capsular calcifications noted posteromedially.  There is calcification along the articular cartilage surfaces.  Cannot exclude small free osteochondral fragments loose within the joint.  No specific fracture observed.  Subcortical sclerosis noted in the medial compartment.  IMPRESSION:  1.  Severe CPPD arthropathy.  2.  Chronic sawtooth pattern interdigitation of the flattened patella with the femoral trochlear groove.  Chronic lateral patellar subluxation. 3.  Moderate knee effusion. 4.  Cannot exclude free osteochondral fragments given the degree of capsular calcification. 5.  No acute fracture identified.  If symptoms persist despite conservative therapy, MRI followup may be warranted.   Original Report Authenticated By: Gaylyn Rong, M.D.   Mr Knee Right Wo Contrast  09/29/2012   *RADIOLOGY REPORT*  Clinical Data:  Pain after exiting automobile.  Arthritis.  MRI OF THE RIGHT KNEE WITHOUT CONTRAST  Technique:  Multiplanar, multisequence MR imaging of the  knee was performed.  No intravenous contrast was administered.  Comparison:  09/29/2012  FINDINGS: MENISCI Medial:  Heterogeneity attributed to chondrocalcinosis. Lateral:  Complex degenerative tearing in the mid body and anterior horn.  LIGAMENTS Cruciates:  Intact Collaterals:  Low-level edema tracks adjacent to the MCL.  Abnormal thickened and edematous appearance of the  proximal fibular collateral ligament with low-level adjacent edema suggesting partial tear.  Popliteus tendon intact.  CARTILAGE Patellofemoral:  Full-thickness loss of articular cartilage observed, with flattening and remodeling of both the patella and the femoral trochlear groove laterally any sawtooth interlocking pattern as shown on the CT scan.  However, currently the patella is separated by about 7 mm from the femoral condyle with joint fluid. Medial:  Moderate to marked degenerative articular cartilage thinning Lateral:  Marked chondral thinning.  Joint:  Moderate knee effusion.  Mildly thickened medial plica. 1.3 x 0.5 by 0.4 cm filling defect posterior to medial femoral condyle, favor free chondral fragment over synovitis.  Mild synovitis along the suprapatellar bursa. Popliteal Fossa:  Intact Extensor Mechanism:  Patellofemoral articulation with indicated to dating vertical groups, patellar flattening, and lateral femoral trochlear flattening as noted above.  Otherwise intact. Bones: Degenerative subcortical cyst or geode along the anteromedial rim of the medial tibial plateau.  Small geode along the tibial spine.  Prominent marginal spurring in the medial and lateral compartments.  IMPRESSION:  1.  Partial tear or grade II sprain of the fibular collateral ligament.  Potential grade 1 sprain of the medial collateral ligament. 2.  Irregular interdigitating soft to the articulation of the patella with the lateral femoral condyle, with flattening of both surfaces, full-thickness loss of associated articular cartilage, and moderate knee effusion. 3.  Mild synovitis with thickened medial plica. 4.  Suspected free chondral fragment posteromedially in the knee joint. 5.  Advanced degenerative tearing of the mid body and anterior horn lateral meniscus. 6.  Known chondrocalcinosis with severe marginal spurring in the medial and lateral compartments, compatible with CPPD arthropathy.   Original Report Authenticated  By: Gaylyn Rong, M.D.   Dg Knee Complete 4 Views Right  09/29/2012   *RADIOLOGY REPORT*  Clinical Data: Acute severe pain in the right the earlier today while trying to get out of her automobile.  RIGHT KNEE - COMPLETE 4+ VIEW  Comparison: None.  Findings: No evidence of acute or subacute fracture.  Severe tricompartment joint space narrowing, worst in the patellofemoral compartment.  Extensive chondrocalcinosis involving the medial and lateral menisci.  Patella alta.  Small joint effusion.  Relatively well-preserved bone mineral density for age.  IMPRESSION: Severe tricompartment osteoarthritis secondary to CPPD.  Patella alta.  Small joint effusion.   Original Report Authenticated By: Hulan Saas, M.D.    Microbiology: No results found for this or any previous visit (from the past 240 hour(s)).   Labs: Basic Metabolic Panel:  Recent Labs Lab 09/29/12  1640 09/30/12 0420  NA 139 139  Fletcher 3.4* 3.6  CL 100 104  CO2 28 27  GLUCOSE 105* 101*  BUN 16 14  CREATININE 0.92 0.85  CALCIUM 9.8 9.7   Liver Function Tests:  Recent Labs Lab 09/29/12 1640  AST 19  ALT 9  ALKPHOS 92  BILITOT 0.6  PROT 7.2  ALBUMIN 3.8   No results found for this basename: LIPASE, AMYLASE,  in the last 168 hours No results found for this basename: AMMONIA,  in the last 168 hours CBC:  Recent Labs Lab 09/29/12 1640 09/30/12 0420  WBC 5.8 5.0  NEUTROABS 3.6  --   HGB 11.1* 10.6*  HCT 34.7* 32.2*  MCV 82.6 82.4  PLT 200 183   Cardiac Enzymes: No results found for this basename: CKTOTAL, CKMB, CKMBINDEX, TROPONINI,  in the last 168 hours BNP: BNP (last 3 results) No results found for this basename: PROBNP,  in the last 8760 hours CBG: No results found for this basename: GLUCAP,  in the last 168 hours     Signed:  Temprence Rhines Fletcher  [facility] 09/30/2012, 10:09 AM

## 2012-10-01 NOTE — Progress Notes (Signed)
   CARE MANAGEMENT NOTE 10/01/2012  Patient:  Natasha, Fletcher   Account Number:  [id number]  Date Initiated:  10/01/2012  Documentation initiated by:  [facility]  Subjective/Objective Assessment:     Action/Plan:   HH   Anticipated DC Date:  10/01/2012   Anticipated DC Plan:  HOME W HOME HEALTH SERVICES         [facility] Choice  HOME HEALTH   Choice offered to / List presented to:  C-1 Patient        HH arranged  HH-2 PT  HH-3 OT      [facility] agency  [facility]   Status of service:  Completed, signed off Medicare Important Message given?   (If response is "NO", the following Medicare IM given date fields will be blank) Date Medicare IM given:   Date Additional Medicare IM given:    Discharge Disposition:  HOME W HOME HEALTH SERVICES  Per UR Regulation:    If discussed at Long Length of Stay Meetings, dates discussed:    Comments:  10/01/2012 1645 NCM spoke to pt and states she had [facility] in the past. Requested [facility] for [facility]. Faxed orders to [facility]. Isidoro Donning RN CCM Case Mgmt phone [phone]

## 2012-10-19 ENCOUNTER — Other Ambulatory Visit: Payer: Self-pay | Admitting: *Deleted

## 2012-10-19 DIAGNOSIS — C50111 Malignant neoplasm of central portion of right female breast: Secondary | ICD-10-CM

## 2012-10-19 MED ORDER — ANASTROZOLE 1 MG PO TABS: 1.0000 mg | ORAL_TABLET | Freq: Every day | ORAL | Status: DC

## 2013-01-24 ENCOUNTER — Other Ambulatory Visit: Payer: Self-pay | Admitting: Oncology

## 2013-01-24 DIAGNOSIS — Z853 Personal history of malignant neoplasm of breast: Secondary | ICD-10-CM

## 2013-01-30 ENCOUNTER — Other Ambulatory Visit: Payer: Self-pay | Admitting: Surgical

## 2013-01-30 NOTE — H&P (Signed)
TOTAL KNEE ADMISSION H&P  Patient is being admitted for right total knee arthroplasty.  Subjective:  Chief Complaint:right knee pain.  HPI: Natasha Fletcher, 79 y.o. female, has a history of pain and functional disability in the right knee due to arthritis and has failed non-surgical conservative treatments for greater than 12 weeks to includeNSAID's and/or analgesics, corticosteriod injections and activity modification.  Onset of symptoms was gradual, starting 4 years ago with gradually worsening course since that time. The patient noted no past surgery on the right knee(s).  Patient currently rates pain in the right knee(s) at 6 out of 10 with activity. Patient has night pain, worsening of pain with activity and weight bearing, pain that interferes with activities of daily living, pain with passive range of motion, crepitus and joint swelling.  Patient has evidence of periarticular osteophytes and joint space narrowing by imaging studies.  There is no active infection.  Patient Active Problem List   Diagnosis Date Noted  . Knee pain, acute 09/29/2012  . Hypertension   . Arthritis   . Thyroid disease   . Cancer of central portion of female breast 04/14/2011   Past Medical History  Diagnosis Date  . Hypertension   . Asthma   . Arthritis   . Glaucoma   . Incontinence   . Wears dentures   . Wears glasses   . Thyroid disease   . GERD (gastroesophageal reflux disease)   . Cancer     rt breast  . Edema leg     Past Surgical History  Procedure Laterality Date  . Total hip arthroplasty    . Shoulder surgery    . Hand surgery    . Appendectomy    . Carpal tunnel release      rt  . Tonsillectomy    . Colonoscopy    . Abdominal hysterectomy    . Eye surgery      both cataracts  . Breast biopsy  04/27/2011    Procedure: BREAST BIOPSY WITH NEEDLE LOCALIZATION;  Surgeon: Mariella Saa, MD;  Location: [facility];  Service: General;  Laterality: Right;  right needle  localized breast lumpectomy   . Breast lumpectomy  04/27/11    right breast by [facility]     Current outpatient prescriptions: albuterol (PROVENTIL HFA;VENTOLIN HFA) 108 (90 BASE) MCG/ACT inhaler, Inhale 2 puffs into the lungs every 4 (four) hours as needed. For wheezing, Disp: , Rfl: ;   albuterol (PROVENTIL) (2.5 MG/3ML) 0.083% nebulizer solution, Take 2.5 mg by nebulization every 6 (six) hours as needed. For wheezing, Disp: , Rfl: ;   anastrozole (ARIMIDEX) 1 MG tablet, Take 1 tablet (1 mg total) by mouth daily., Disp: 30 tablet, Rfl: 8 Calcium Carbonate-Vitamin D (CALCIUM + D PO), Take 1 tablet by mouth 2 (two) times daily., Disp: , Rfl: ;   Cholecalciferol (VITAMIN D-3 PO), Take 1 tablet by mouth daily. Pt. Not sure of dose, Disp: , Rfl: ;   Cyanocobalamin (VITAMIN B-12 PO), Take 1 tablet by mouth daily., Disp: , Rfl: ;   latanoprost (XALATAN) 0.005 % ophthalmic solution, Place 1 drop into both eyes at bedtime., Disp: , Rfl:  lisinopril-hydrochlorothiazide (PRINZIDE,ZESTORETIC) 20-25 MG per tablet, Take 1 tablet by mouth daily., Disp: , Rfl: ;   oxybutynin (DITROPAN) 5 MG tablet, Take 5 mg by mouth 3 (three) times daily as needed. To prevent urinary frequency, Disp: , Rfl: ;   predniSONE (DELTASONE) 5 MG tablet, Take 5 mg by mouth See admin  instructions. Take 5mg  every 2-3 days, Disp: , Rfl:  traMADol (ULTRAM) 50 MG tablet, Take 50 mg by mouth 3 (three) times daily as needed. For pain, Disp: , Rfl: ;   triazolam (HALCION) 0.125 MG tablet, Take 0.125 mg by mouth at bedtime. , Disp: , Rfl:   Allergies  Allergen Reactions  . Sulfa Antibiotics Other (See Comments)    Reaction many years ago-unknown    History  Substance Use Topics  . Smoking status: Former Smoker -- 0.50 packs/day    Quit date: 04/21/1975  . Smokeless tobacco: Never Used  . Alcohol Use: No    Family History  Problem Relation Age of Onset  . Heart disease Mother   . Stroke Father      Review of Systems   Constitutional: Negative.   HENT: Negative.   Eyes: Negative.   Respiratory: Negative.   Cardiovascular: Negative.   Gastrointestinal: Positive for vomiting and diarrhea. Negative for heartburn, nausea, abdominal pain, constipation, blood in stool and melena.  Genitourinary: Positive for frequency. Negative for dysuria, urgency, hematuria and flank pain.  Musculoskeletal: Positive for back pain and joint pain. Negative for falls, myalgias and neck pain.  Skin: Positive for itching. Negative for rash.  Neurological: Positive for tremors. Negative for dizziness, tingling, sensory change, speech change, focal weakness, seizures and loss of consciousness.  Endo/Heme/Allergies: Negative.   Psychiatric/Behavioral: Negative.     Objective:  Physical Exam  Constitutional: She is oriented to person, place, and time. She appears well-developed and well-nourished. No distress.  HENT:  Head: Normocephalic and atraumatic.  Right Ear: External ear normal.  Left Ear: External ear normal.  Nose: Nose normal.  Mouth/Throat: Oropharynx is clear and moist.  Eyes: Conjunctivae and EOM are normal.  Neck: Normal range of motion. Neck supple.  Cardiovascular: Normal rate, regular rhythm, normal heart sounds and intact distal pulses.   No murmur heard. Respiratory: Effort normal and breath sounds normal. No respiratory distress. She has no wheezes.  GI: Soft. Bowel sounds are normal. She exhibits no distension. There is no tenderness.  Musculoskeletal:       Right hip: Normal.       Left hip: Normal.       Right knee: She exhibits decreased range of motion and swelling. She exhibits no effusion and no erythema. Tenderness found. Medial joint line and lateral joint line tenderness noted.       Left knee: She exhibits decreased range of motion and swelling. She exhibits no effusion and no erythema. Tenderness found. Medial joint line and lateral joint line tenderness noted.       Right lower leg: She  exhibits no tenderness and no swelling.       Left lower leg: She exhibits no tenderness and no swelling.  Both knees show moderate effusions, worse on the right than the left. She has marked crepitus on ROM of the knees. She is tender lateral greater than medial but has medial and lateral tenderness. There is no instability noted.  Neurological: She is oriented to person, place, and time. She has normal strength and normal reflexes. No sensory deficit.  Skin: No rash noted. She is not diaphoretic. No erythema.  Psychiatric: She has a normal mood and affect. Her behavior is normal.    Vitals Weight: 167 lb Height: 69 in Body Surface Area: 1.92 m Body Mass Index: 24.66 kg/m Pulse: 80 (Regular) BP: 138/82 (Sitting, Left Arm, Standard)  Estimated body mass index is 25.37 kg/(m^2) as calculated from the  following:   Height as of 09/29/12: 5\' 7"  (1.702 m).   Weight as of 09/29/12: 73.483 kg (162 lb).   Imaging Review Plain radiographs demonstrate severe degenerative joint disease of the right knee(s). The overall alignment ismild valgus. The bone quality appears to be fair for age and reported activity level.  Assessment/Plan:  End stage arthritis, right knee   The patient history, physical examination, clinical judgment of the provider and imaging studies are consistent with end stage degenerative joint disease of the right knee(s) and total knee arthroplasty is deemed medically necessary. The treatment options including medical management, injection therapy arthroscopy and arthroplasty were discussed at length. The risks and benefits of total knee arthroplasty were presented and reviewed. The risks due to aseptic loosening, infection, stiffness, patella tracking problems, thromboembolic complications and other imponderables were discussed. The patient acknowledged the explanation, agreed to proceed with the plan and consent was signed. Patient is being admitted for inpatient  treatment for surgery, pain control, PT, OT, prophylactic antibiotics, VTE prophylaxis, progressive ambulation and ADL's and discharge planning. The patient is planning to be discharged home with home health services    [place], [place]

## 2013-01-31 NOTE — Progress Notes (Signed)
Need orders in EPIC.  Surgery scheduled for 02/19/13.  Thank You.  

## 2013-02-01 ENCOUNTER — Other Ambulatory Visit: Payer: Self-pay | Admitting: Orthopedic Surgery

## 2013-02-01 NOTE — Progress Notes (Signed)
Preoperative surgical orders have been place into the Epic hospital system for Natasha Fletcher on 02/01/2013, 5:26 PM  by Patrica Duel for surgery on 02/19/2013.  Preop Total Knee orders including Experal, PO Tylenol, and IV Decadron as long as there are no contraindications to the above medications. Avel Peace, PA-C

## 2013-02-05 ENCOUNTER — Telehealth (INDEPENDENT_AMBULATORY_CARE_PROVIDER_SITE_OTHER): Payer: Self-pay

## 2013-02-05 NOTE — Telephone Encounter (Signed)
Message copied by Maryan Puls on Mon Feb 05, 2013 11:17 AM ------      Message from: Louie Casa      Created: Thu Feb 01, 2013  3:30 PM      Regarding: Dr. Odette Fraction appt in November      Contact: [phone]       Patient needs an appointment before 02/19/13 with Dr. Johna Sheriff for breast recheck because she is going to have a knee surgery, please could you make the follow up appointment.            Thank you. ------

## 2013-02-05 NOTE — Telephone Encounter (Signed)
Called patient with LTF appointment scheduled for 03/22/13 @ 12:00 pm w/Dr. Johna Sheriff.

## 2013-02-12 ENCOUNTER — Encounter (HOSPITAL_COMMUNITY): Payer: Self-pay | Admitting: Pharmacy Technician

## 2013-02-12 ENCOUNTER — Other Ambulatory Visit (HOSPITAL_COMMUNITY): Payer: Self-pay | Admitting: *Deleted

## 2013-02-12 NOTE — Patient Instructions (Signed)
Adonica B Glanz  02/12/2013                           YOUR PROCEDURE IS SCHEDULED ON:  02/19/13               PLEASE REPORT TO SHORT STAY CENTER AT :  8:30 AM               CALL THIS NUMBER IF ANY PROBLEMS THE DAY OF SURGERY :               [phone]                      REMEMBER:   Do not eat food or drink liquids AFTER MIDNIGHT   Take these medicines the morning of surgery with A SIP OF WATER:   Do not wear jewelry, make-up   Do not wear lotions, powders, or perfumes.   Do not shave legs or underarms 12 hrs. before surgery (men may shave face)  Do not bring valuables to the hospital.  Contacts, dentures or bridgework may not be worn into surgery.  Leave suitcase in the car. After surgery it may be brought to your room.  For patients admitted to the hospital more than one night, checkout time is 11:00                          The day of discharge.   Patients discharged the day of surgery will not be allowed to drive home                             If going home same day of surgery, must have someone stay with you first                           24 hrs at home and arrange for some one to drive you home from hospital.    Special Instructions:   Please read over the following fact sheets that you were given:               1. MRSA  INFORMATION                      2. [place] PREPARING FOR SURGERY SHEET               3. INCENTIVE SPIROMETER                                                X_____________________________________________________________________        Failure to follow these instructions may result in cancellation of your surgery

## 2013-02-13 ENCOUNTER — Inpatient Hospital Stay (HOSPITAL_COMMUNITY)
Admission: RE | Admit: 2013-02-13 | Discharge: 2013-02-13 | Disposition: A | Discharge status: Home / Self Care | Payer: Medicare Other | Source: Ambulatory Visit

## 2013-02-14 ENCOUNTER — Other Ambulatory Visit (HOSPITAL_COMMUNITY): Payer: Self-pay | Admitting: Orthopedic Surgery

## 2013-02-15 ENCOUNTER — Ambulatory Visit (HOSPITAL_COMMUNITY)
Admission: RE | Admit: 2013-02-15 | Discharge: 2013-02-15 | Disposition: A | Discharge status: Home / Self Care | Payer: Medicare Other | Source: Ambulatory Visit | Attending: Orthopedic Surgery | Admitting: Orthopedic Surgery

## 2013-02-15 ENCOUNTER — Encounter (HOSPITAL_COMMUNITY)
Admission: RE | Admit: 2013-02-15 | Discharge: 2013-02-15 | Disposition: A | Discharge status: Home / Self Care | Payer: Medicare Other | Source: Ambulatory Visit | Attending: Orthopedic Surgery | Admitting: Orthopedic Surgery

## 2013-02-15 ENCOUNTER — Encounter (HOSPITAL_COMMUNITY): Payer: Self-pay

## 2013-02-15 DIAGNOSIS — J8410 Pulmonary fibrosis, unspecified: Secondary | ICD-10-CM | POA: Insufficient documentation

## 2013-02-15 DIAGNOSIS — Z01810 Encounter for preprocedural cardiovascular examination: Secondary | ICD-10-CM | POA: Insufficient documentation

## 2013-02-15 DIAGNOSIS — M1710 Unilateral primary osteoarthritis, unspecified knee: Secondary | ICD-10-CM | POA: Insufficient documentation

## 2013-02-15 DIAGNOSIS — Z01818 Encounter for other preprocedural examination: Secondary | ICD-10-CM | POA: Insufficient documentation

## 2013-02-15 DIAGNOSIS — Z01812 Encounter for preprocedural laboratory examination: Secondary | ICD-10-CM | POA: Insufficient documentation

## 2013-02-15 HISTORY — DX: Pneumonia, unspecified organism: J18.9

## 2013-02-15 HISTORY — DX: Tachypnea, not elsewhere classified: R06.82

## 2013-02-15 LAB — CBC
HCT: 34.1 % — ABNORMAL LOW (ref 36.0–46.0)
MCV: 85 fL (ref 78.0–100.0)
Platelets: 335 10*3/uL (ref 150–400)
RBC: 4.01 MIL/uL (ref 3.87–5.11)
WBC: 7.6 10*3/uL (ref 4.0–10.5)

## 2013-02-15 LAB — COMPREHENSIVE METABOLIC PANEL
AST: 20 U/L (ref 0–37)
Alkaline Phosphatase: 106 U/L (ref 39–117)
BUN: 12 mg/dL (ref 6–23)
CO2: 26 mEq/L (ref 19–32)
Chloride: 97 mEq/L (ref 96–112)
Creatinine, Ser: 1.01 mg/dL (ref 0.50–1.10)
GFR calc non Af Amer: 49 mL/min — ABNORMAL LOW (ref >90)
Sodium: 134 mEq/L — ABNORMAL LOW (ref 135–145)
Total Bilirubin: 0.8 mg/dL (ref 0.3–1.2)

## 2013-02-15 LAB — URINE MICROSCOPIC-ADD ON

## 2013-02-15 LAB — URINALYSIS, ROUTINE W REFLEX MICROSCOPIC
Glucose, UA: NEGATIVE mg/dL
Ketones, ur: NEGATIVE mg/dL
Specific Gravity, Urine: 1.02 (ref 1.005–1.030)
pH: 6 (ref 5.0–8.0)

## 2013-02-15 LAB — SURGICAL PCR SCREEN: MRSA, PCR: NEGATIVE

## 2013-02-15 LAB — PROTIME-INR
INR: 1.04 (ref 0.00–1.49)
Prothrombin Time: 13.4 seconds (ref 11.6–15.2)

## 2013-02-15 LAB — ABO/RH: ABO/RH(D): O POS

## 2013-02-15 NOTE — Progress Notes (Signed)
EKG from today and from 2013 reviewed by Dr. Leta Jungling. Pt is cleared for surgery.

## 2013-02-15 NOTE — Patient Instructions (Addendum)
20 Jennessa B Gilden  02/15/2013   Your procedure is scheduled on: 02/19/13  Report to [facility] at 08:35 AM.  Call this number if you have problems the morning of surgery [phone]: [phone]   Remember:   Do not eat food or drink liquids After Midnight.     Take these medicines the morning of surgery with A SIP OF WATER: anastrozole, prednisone   Do not wear jewelry, make-up or nail polish.  Do not wear lotions, powders, or perfumes. You may wear deodorant.  Do not shave 48 hours prior to surgery. Men may shave face and neck.  Do not bring valuables to the hospital.  Contacts, dentures or bridgework may not be worn into surgery.  Leave suitcase in the car. After surgery it may be brought to your room.  For patients admitted to the hospital, checkout time is 11:00 AM the day of discharge.    Please read over the following fact sheets that you were given: MRSA Information, blood fact sheet, Incentive spirometry fact sheet Birdie Sons, RN  pre op nurse call if needed [phone]    FAILURE TO FOLLOW THESE INSTRUCTIONS MAY RESULT IN CANCELLATION OF YOUR SURGERY   Patient Signature: ___________________________________________

## 2013-02-17 LAB — URINE CULTURE

## 2013-02-19 ENCOUNTER — Inpatient Hospital Stay (HOSPITAL_COMMUNITY): Payer: Medicare Other | Admitting: Anesthesiology

## 2013-02-19 ENCOUNTER — Encounter (HOSPITAL_COMMUNITY)
Admission: RE | Disposition: A | Discharge status: Skilled Nursing Facility | Payer: Self-pay | Source: Ambulatory Visit | Attending: Orthopedic Surgery

## 2013-02-19 ENCOUNTER — Encounter (HOSPITAL_COMMUNITY): Payer: Self-pay | Admitting: *Deleted

## 2013-02-19 ENCOUNTER — Inpatient Hospital Stay (HOSPITAL_COMMUNITY)
Admission: RE | Admit: 2013-02-19 | Discharge: 2013-02-22 | DRG: 470 | Disposition: A | Discharge status: Skilled Nursing Facility | Payer: Medicare Other | Source: Ambulatory Visit | Attending: Orthopedic Surgery | Admitting: Orthopedic Surgery

## 2013-02-19 ENCOUNTER — Encounter (HOSPITAL_COMMUNITY): Payer: Medicare Other | Admitting: Anesthesiology

## 2013-02-19 DIAGNOSIS — C50119 Malignant neoplasm of central portion of unspecified female breast: Secondary | ICD-10-CM

## 2013-02-19 DIAGNOSIS — M25569 Pain in unspecified knee: Secondary | ICD-10-CM | POA: Diagnosis present

## 2013-02-19 DIAGNOSIS — E079 Disorder of thyroid, unspecified: Secondary | ICD-10-CM | POA: Diagnosis present

## 2013-02-19 DIAGNOSIS — Z87891 Personal history of nicotine dependence: Secondary | ICD-10-CM

## 2013-02-19 DIAGNOSIS — N390 Urinary tract infection, site not specified: Secondary | ICD-10-CM | POA: Diagnosis present

## 2013-02-19 DIAGNOSIS — M179 Osteoarthritis of knee, unspecified: Secondary | ICD-10-CM | POA: Diagnosis present

## 2013-02-19 DIAGNOSIS — IMO0002 Reserved for concepts with insufficient information to code with codable children: Secondary | ICD-10-CM

## 2013-02-19 DIAGNOSIS — M1710 Unilateral primary osteoarthritis, unspecified knee: Principal | ICD-10-CM | POA: Diagnosis present

## 2013-02-19 DIAGNOSIS — E876 Hypokalemia: Secondary | ICD-10-CM

## 2013-02-19 DIAGNOSIS — Z823 Family history of stroke: Secondary | ICD-10-CM

## 2013-02-19 DIAGNOSIS — K219 Gastro-esophageal reflux disease without esophagitis: Secondary | ICD-10-CM | POA: Diagnosis present

## 2013-02-19 DIAGNOSIS — Z96651 Presence of right artificial knee joint: Secondary | ICD-10-CM

## 2013-02-19 DIAGNOSIS — M2540 Effusion, unspecified joint: Secondary | ICD-10-CM | POA: Diagnosis present

## 2013-02-19 DIAGNOSIS — H409 Unspecified glaucoma: Secondary | ICD-10-CM | POA: Diagnosis present

## 2013-02-19 DIAGNOSIS — J45909 Unspecified asthma, uncomplicated: Secondary | ICD-10-CM | POA: Diagnosis present

## 2013-02-19 DIAGNOSIS — Z8701 Personal history of pneumonia (recurrent): Secondary | ICD-10-CM

## 2013-02-19 DIAGNOSIS — Z79899 Other long term (current) drug therapy: Secondary | ICD-10-CM

## 2013-02-19 DIAGNOSIS — Z96649 Presence of unspecified artificial hip joint: Secondary | ICD-10-CM

## 2013-02-19 DIAGNOSIS — D62 Acute posthemorrhagic anemia: Secondary | ICD-10-CM | POA: Diagnosis not present

## 2013-02-19 DIAGNOSIS — I10 Essential (primary) hypertension: Secondary | ICD-10-CM | POA: Diagnosis present

## 2013-02-19 DIAGNOSIS — Z8249 Family history of ischemic heart disease and other diseases of the circulatory system: Secondary | ICD-10-CM

## 2013-02-19 HISTORY — PX: TOTAL KNEE ARTHROPLASTY: SHX125

## 2013-02-19 SURGERY — ARTHROPLASTY, KNEE, TOTAL
Anesthesia: Spinal | Site: Knee | Laterality: Right | Wound class: Clean

## 2013-02-19 MED ORDER — METHYLPREDNISOLONE ACETATE 40 MG/ML IJ SUSP
INTRAMUSCULAR | Status: DC | PRN
  Administered 2013-02-19 (×2): 40 mg

## 2013-02-19 MED ORDER — METHOCARBAMOL 500 MG PO TABS
500.0000 mg | ORAL_TABLET | Freq: Four times a day (QID) | ORAL | Status: DC | PRN
  Administered 2013-02-20 – 2013-02-21 (×4): 500 mg via ORAL
  Filled 2013-02-19 (×4): qty 1

## 2013-02-19 MED ORDER — FUROSEMIDE 20 MG PO TABS
20.0000 mg | ORAL_TABLET | Freq: Every morning | ORAL | Status: DC
  Filled 2013-02-19: qty 1

## 2013-02-19 MED ORDER — ACETAMINOPHEN 500 MG PO TABS
1000.0000 mg | ORAL_TABLET | Freq: Four times a day (QID) | ORAL | Status: AC
  Administered 2013-02-19 – 2013-02-20 (×4): 1000 mg via ORAL
  Filled 2013-02-19 (×4): qty 2

## 2013-02-19 MED ORDER — PROPOFOL INFUSION 10 MG/ML OPTIME
INTRAVENOUS | Status: DC | PRN
  Administered 2013-02-19: 75 ug/kg/min via INTRAVENOUS

## 2013-02-19 MED ORDER — MENTHOL 3 MG MT LOZG: 1.0000 | LOZENGE | OROMUCOSAL | Status: DC | PRN

## 2013-02-19 MED ORDER — LATANOPROST 0.005 % OP SOLN
1.0000 [drp] | Freq: Every day | OPHTHALMIC | Status: DC
  Administered 2013-02-19 – 2013-02-21 (×3): 1 [drp] via OPHTHALMIC
  Filled 2013-02-19: qty 2.5

## 2013-02-19 MED ORDER — ONDANSETRON HCL 4 MG/2ML IJ SOLN
INTRAMUSCULAR | Status: DC | PRN
  Administered 2013-02-19: 4 mg via INTRAVENOUS

## 2013-02-19 MED ORDER — BISACODYL 10 MG RE SUPP: 10.0000 mg | Freq: Every day | RECTAL | Status: DC | PRN

## 2013-02-19 MED ORDER — ANASTROZOLE 1 MG PO TABS
1.0000 mg | ORAL_TABLET | Freq: Every day | ORAL | Status: DC
  Administered 2013-02-20 – 2013-02-22 (×3): 1 mg via ORAL
  Filled 2013-02-19 (×4): qty 1

## 2013-02-19 MED ORDER — LACTATED RINGERS IV SOLN: INTRAVENOUS | Status: DC

## 2013-02-19 MED ORDER — TRAMADOL HCL 50 MG PO TABS
50.0000 mg | ORAL_TABLET | Freq: Four times a day (QID) | ORAL | Status: DC | PRN
  Administered 2013-02-20 – 2013-02-21 (×2): 100 mg via ORAL
  Administered 2013-02-22: 50 mg via ORAL
  Filled 2013-02-19 (×2): qty 2
  Filled 2013-02-19: qty 1

## 2013-02-19 MED ORDER — DIPHENHYDRAMINE HCL 12.5 MG/5ML PO ELIX: 12.5000 mg | ORAL_SOLUTION | ORAL | Status: DC | PRN

## 2013-02-19 MED ORDER — METHYLPREDNISOLONE ACETATE 40 MG/ML IJ SUSP
INTRAMUSCULAR | Status: AC
  Filled 2013-02-19: qty 1

## 2013-02-19 MED ORDER — STERILE WATER FOR IRRIGATION IR SOLN
Status: DC | PRN
  Administered 2013-02-19: 3000 mL

## 2013-02-19 MED ORDER — DEXAMETHASONE SODIUM PHOSPHATE 10 MG/ML IJ SOLN
10.0000 mg | Freq: Every day | INTRAMUSCULAR | Status: AC
  Filled 2013-02-19: qty 1

## 2013-02-19 MED ORDER — LACTATED RINGERS IV SOLN
INTRAVENOUS | Status: DC
  Administered 2013-02-19: 1000 mL via INTRAVENOUS

## 2013-02-19 MED ORDER — SODIUM CHLORIDE 0.9 % IV SOLN: INTRAVENOUS | Status: DC

## 2013-02-19 MED ORDER — METHOCARBAMOL 100 MG/ML IJ SOLN
500.0000 mg | Freq: Four times a day (QID) | INTRAVENOUS | Status: DC | PRN
  Administered 2013-02-19: 500 mg via INTRAVENOUS
  Filled 2013-02-19: qty 5

## 2013-02-19 MED ORDER — RIVAROXABAN 10 MG PO TABS
10.0000 mg | ORAL_TABLET | Freq: Every day | ORAL | Status: DC
  Administered 2013-02-20 – 2013-02-22 (×3): 10 mg via ORAL
  Filled 2013-02-19 (×4): qty 1

## 2013-02-19 MED ORDER — BUPIVACAINE HCL (PF) 0.25 % IJ SOLN
INTRAMUSCULAR | Status: AC
  Filled 2013-02-19: qty 30

## 2013-02-19 MED ORDER — SODIUM CHLORIDE 0.9 % IR SOLN
Status: DC | PRN
  Administered 2013-02-19: 1000 mL

## 2013-02-19 MED ORDER — ACETAMINOPHEN 500 MG PO TABS
1000.0000 mg | ORAL_TABLET | Freq: Once | ORAL | Status: AC
  Administered 2013-02-19: 1000 mg via ORAL
  Filled 2013-02-19: qty 2

## 2013-02-19 MED ORDER — LIDOCAINE HCL 1 % IJ SOLN
INTRAMUSCULAR | Status: AC
  Filled 2013-02-19: qty 20

## 2013-02-19 MED ORDER — SODIUM CHLORIDE 0.9 % IJ SOLN
INTRAMUSCULAR | Status: DC | PRN
  Administered 2013-02-19: 15:00:00

## 2013-02-19 MED ORDER — ONDANSETRON HCL 4 MG/2ML IJ SOLN: 4.0000 mg | Freq: Four times a day (QID) | INTRAMUSCULAR | Status: DC | PRN

## 2013-02-19 MED ORDER — METOCLOPRAMIDE HCL 5 MG/ML IJ SOLN: 5.0000 mg | Freq: Three times a day (TID) | INTRAMUSCULAR | Status: DC | PRN

## 2013-02-19 MED ORDER — OXYCODONE HCL 5 MG PO TABS
5.0000 mg | ORAL_TABLET | ORAL | Status: DC | PRN
  Administered 2013-02-19: 5 mg via ORAL
  Administered 2013-02-20 – 2013-02-22 (×13): 10 mg via ORAL
  Filled 2013-02-19 (×3): qty 2
  Filled 2013-02-19: qty 1
  Filled 2013-02-19 (×10): qty 2

## 2013-02-19 MED ORDER — KETAMINE HCL 50 MG/ML IJ SOLN
INTRAMUSCULAR | Status: DC | PRN
  Administered 2013-02-19 (×5): 10 mg via INTRAMUSCULAR

## 2013-02-19 MED ORDER — CEFAZOLIN SODIUM-DEXTROSE 2-3 GM-% IV SOLR
INTRAVENOUS | Status: AC
  Filled 2013-02-19: qty 50

## 2013-02-19 MED ORDER — MORPHINE SULFATE 2 MG/ML IJ SOLN
1.0000 mg | INTRAMUSCULAR | Status: DC | PRN
  Administered 2013-02-19: 1 mg via INTRAVENOUS
  Administered 2013-02-19 – 2013-02-21 (×3): 2 mg via INTRAVENOUS
  Filled 2013-02-19 (×4): qty 1

## 2013-02-19 MED ORDER — DEXAMETHASONE 6 MG PO TABS
10.0000 mg | ORAL_TABLET | Freq: Every day | ORAL | Status: AC
  Administered 2013-02-20: 10 mg via ORAL
  Filled 2013-02-19: qty 1

## 2013-02-19 MED ORDER — POLYETHYLENE GLYCOL 3350 17 G PO PACK: 17.0000 g | PACK | Freq: Every day | ORAL | Status: DC | PRN

## 2013-02-19 MED ORDER — SODIUM CHLORIDE 0.9 % IJ SOLN
INTRAMUSCULAR | Status: AC
  Filled 2013-02-19: qty 50

## 2013-02-19 MED ORDER — BUPIVACAINE LIPOSOME 1.3 % IJ SUSP
20.0000 mL | Freq: Once | INTRAMUSCULAR | Status: DC
  Filled 2013-02-19: qty 20

## 2013-02-19 MED ORDER — CEFAZOLIN SODIUM-DEXTROSE 2-3 GM-% IV SOLR
2.0000 g | INTRAVENOUS | Status: AC
  Administered 2013-02-19: 2 g via INTRAVENOUS

## 2013-02-19 MED ORDER — PHENOL 1.4 % MT LIQD: 1.0000 | OROMUCOSAL | Status: DC | PRN

## 2013-02-19 MED ORDER — DEXAMETHASONE SODIUM PHOSPHATE 10 MG/ML IJ SOLN: 10.0000 mg | Freq: Once | INTRAMUSCULAR | Status: DC

## 2013-02-19 MED ORDER — TRANEXAMIC ACID 100 MG/ML IV SOLN
1000.0000 mg | INTRAVENOUS | Status: AC
  Administered 2013-02-19: 1000 mg via INTRAVENOUS
  Filled 2013-02-19: qty 10

## 2013-02-19 MED ORDER — HYDROMORPHONE HCL PF 1 MG/ML IJ SOLN: 0.2500 mg | INTRAMUSCULAR | Status: DC | PRN

## 2013-02-19 MED ORDER — BUPIVACAINE HCL 0.25 % IJ SOLN
INTRAMUSCULAR | Status: DC | PRN
  Administered 2013-02-19: 20 mL

## 2013-02-19 MED ORDER — BUPIVACAINE IN DEXTROSE 0.75-8.25 % IT SOLN
INTRATHECAL | Status: DC | PRN
  Administered 2013-02-19: 2 mL via INTRATHECAL

## 2013-02-19 MED ORDER — ACETAMINOPHEN 325 MG PO TABS: 650.0000 mg | ORAL_TABLET | Freq: Four times a day (QID) | ORAL | Status: DC | PRN

## 2013-02-19 MED ORDER — OXYBUTYNIN CHLORIDE 5 MG PO TABS
5.0000 mg | ORAL_TABLET | Freq: Three times a day (TID) | ORAL | Status: DC | PRN
  Filled 2013-02-19: qty 1

## 2013-02-19 MED ORDER — DOCUSATE SODIUM 100 MG PO CAPS
100.0000 mg | ORAL_CAPSULE | Freq: Two times a day (BID) | ORAL | Status: DC
  Administered 2013-02-19 – 2013-02-22 (×6): 100 mg via ORAL

## 2013-02-19 MED ORDER — ONDANSETRON HCL 4 MG PO TABS
4.0000 mg | ORAL_TABLET | Freq: Four times a day (QID) | ORAL | Status: DC | PRN
  Administered 2013-02-22: 4 mg via ORAL
  Filled 2013-02-19: qty 1

## 2013-02-19 MED ORDER — KETOROLAC TROMETHAMINE 15 MG/ML IJ SOLN
7.5000 mg | Freq: Four times a day (QID) | INTRAMUSCULAR | Status: AC | PRN
  Administered 2013-02-19: 7.5 mg via INTRAVENOUS
  Filled 2013-02-19 (×2): qty 1

## 2013-02-19 MED ORDER — POTASSIUM CHLORIDE IN NACL 20-0.9 MEQ/L-% IV SOLN
INTRAVENOUS | Status: DC
  Filled 2013-02-19 (×3): qty 1000

## 2013-02-19 MED ORDER — METOCLOPRAMIDE HCL 10 MG PO TABS: 5.0000 mg | ORAL_TABLET | Freq: Three times a day (TID) | ORAL | Status: DC | PRN

## 2013-02-19 MED ORDER — PHENYLEPHRINE HCL 10 MG/ML IJ SOLN
INTRAMUSCULAR | Status: DC | PRN
  Administered 2013-02-19 (×4): 40 ug via INTRAVENOUS

## 2013-02-19 MED ORDER — GUAIFENESIN ER 600 MG PO TB12
600.0000 mg | ORAL_TABLET | Freq: Two times a day (BID) | ORAL | Status: DC
  Administered 2013-02-19 – 2013-02-22 (×6): 600 mg via ORAL
  Filled 2013-02-19 (×8): qty 1

## 2013-02-19 MED ORDER — CEFAZOLIN SODIUM 1-5 GM-% IV SOLN
1.0000 g | Freq: Four times a day (QID) | INTRAVENOUS | Status: AC
  Administered 2013-02-19 – 2013-02-20 (×2): 1 g via INTRAVENOUS
  Filled 2013-02-19 (×2): qty 50

## 2013-02-19 MED ORDER — LACTATED RINGERS IV SOLN
INTRAVENOUS | Status: DC | PRN
  Administered 2013-02-19 (×2): via INTRAVENOUS

## 2013-02-19 MED ORDER — PROMETHAZINE HCL 25 MG/ML IJ SOLN: 6.2500 mg | INTRAMUSCULAR | Status: DC | PRN

## 2013-02-19 MED ORDER — ACETAMINOPHEN 650 MG RE SUPP: 650.0000 mg | Freq: Four times a day (QID) | RECTAL | Status: DC | PRN

## 2013-02-19 MED ORDER — FLEET ENEMA 7-19 GM/118ML RE ENEM: 1.0000 | ENEMA | Freq: Once | RECTAL | Status: AC | PRN

## 2013-02-19 SURGICAL SUPPLY — 54 items
BAG ZIPLOCK 12X15 (MISCELLANEOUS) ×2 IMPLANT
BANDAGE ELASTIC 6 VELCRO ST LF (GAUZE/BANDAGES/DRESSINGS) ×2 IMPLANT
BANDAGE ESMARK 6X9 LF (GAUZE/BANDAGES/DRESSINGS) ×1 IMPLANT
BLADE SAG 18X100X1.27 (BLADE) ×2 IMPLANT
BLADE SAW SGTL 11.0X1.19X90.0M (BLADE) ×2 IMPLANT
BNDG ESMARK 6X9 LF (GAUZE/BANDAGES/DRESSINGS) ×2
BOWL SMART MIX CTS (DISPOSABLE) ×2 IMPLANT
CAPT RP KNEE ×2 IMPLANT
CEMENT HV SMART SET (Cement) ×4 IMPLANT
CUFF TOURN SGL QUICK 34 (TOURNIQUET CUFF) ×1
CUFF TRNQT CYL 34X4X40X1 (TOURNIQUET CUFF) ×1 IMPLANT
DECANTER SPIKE VIAL GLASS SM (MISCELLANEOUS) ×2 IMPLANT
DRAPE EXTREMITY T 121X128X90 (DRAPE) ×2 IMPLANT
DRAPE POUCH INSTRU U-SHP 10X18 (DRAPES) ×2 IMPLANT
DRAPE U-SHAPE 47X51 STRL (DRAPES) ×2 IMPLANT
DRSG ADAPTIC 3X8 NADH LF (GAUZE/BANDAGES/DRESSINGS) ×2 IMPLANT
DRSG PAD ABDOMINAL 8X10 ST (GAUZE/BANDAGES/DRESSINGS) ×2 IMPLANT
DURAPREP 26ML APPLICATOR (WOUND CARE) ×2 IMPLANT
ELECT REM PT RETURN 9FT ADLT (ELECTROSURGICAL) ×2
ELECTRODE REM PT RTRN 9FT ADLT (ELECTROSURGICAL) ×1 IMPLANT
EVACUATOR 1/8 PVC DRAIN (DRAIN) ×2 IMPLANT
FACESHIELD LNG OPTICON STERILE (SAFETY) ×10 IMPLANT
GLOVE BIO SURGEON STRL SZ7.5 (GLOVE) IMPLANT
GLOVE BIO SURGEON STRL SZ8 (GLOVE) ×2 IMPLANT
GLOVE BIOGEL PI IND STRL 8 (GLOVE) ×2 IMPLANT
GLOVE BIOGEL PI INDICATOR 8 (GLOVE) ×2
GLOVE SURG SS PI 6.5 STRL IVOR (GLOVE) IMPLANT
GOWN PREVENTION PLUS LG XLONG (DISPOSABLE) ×2 IMPLANT
GOWN STRL REIN XL XLG (GOWN DISPOSABLE) IMPLANT
HANDPIECE INTERPULSE COAX TIP (DISPOSABLE) ×1
IMMOBILIZER KNEE 20 (SOFTGOODS) ×2
IMMOBILIZER KNEE 20 THIGH 36 (SOFTGOODS) ×1 IMPLANT
KIT BASIN OR (CUSTOM PROCEDURE TRAY) ×2 IMPLANT
MANIFOLD NEPTUNE II (INSTRUMENTS) ×2 IMPLANT
NDL SAFETY ECLIPSE 18X1.5 (NEEDLE) ×2 IMPLANT
NEEDLE HYPO 18GX1.5 SHARP (NEEDLE) ×2
NS IRRIG 1000ML POUR BTL (IV SOLUTION) ×2 IMPLANT
PACK TOTAL JOINT (CUSTOM PROCEDURE TRAY) ×2 IMPLANT
PADDING CAST COTTON 6X4 STRL (CAST SUPPLIES) ×4 IMPLANT
POSITIONER SURGICAL ARM (MISCELLANEOUS) ×2 IMPLANT
SET HNDPC FAN SPRY TIP SCT (DISPOSABLE) ×1 IMPLANT
SPONGE GAUZE 4X4 12PLY (GAUZE/BANDAGES/DRESSINGS) ×2 IMPLANT
STRIP CLOSURE SKIN 1/2X4 (GAUZE/BANDAGES/DRESSINGS) ×4 IMPLANT
SUCTION FRAZIER 12FR DISP (SUCTIONS) ×2 IMPLANT
SUT MNCRL AB 4-0 PS2 18 (SUTURE) ×2 IMPLANT
SUT VIC AB 2-0 CT1 27 (SUTURE) ×3
SUT VIC AB 2-0 CT1 TAPERPNT 27 (SUTURE) ×3 IMPLANT
SUT VLOC 180 0 24IN GS25 (SUTURE) ×2 IMPLANT
SYR 20CC LL (SYRINGE) ×2 IMPLANT
SYR 50ML LL SCALE MARK (SYRINGE) ×2 IMPLANT
TOWEL OR 17X26 10 PK STRL BLUE (TOWEL DISPOSABLE) ×4 IMPLANT
TRAY FOLEY CATH 14FRSI W/METER (CATHETERS) ×2 IMPLANT
WATER STERILE IRR 1500ML POUR (IV SOLUTION) ×2 IMPLANT
WRAP KNEE MAXI GEL POST OP (GAUZE/BANDAGES/DRESSINGS) ×2 IMPLANT

## 2013-02-19 NOTE — Anesthesia Procedure Notes (Signed)
Spinal  Patient location during procedure: OR Start time: 02/19/2013 1:40 PM End time: 02/19/2013 1:45 PM Staffing Anesthesiologist: Lucille Passy F Performed by: anesthesiologist  Preanesthetic Checklist Completed: patient identified, site marked, surgical consent, pre-op evaluation, timeout performed, IV checked, risks and benefits discussed and monitors and equipment checked Spinal Block Patient position: sitting Prep: Betadine Patient monitoring: heart rate, continuous pulse ox and blood pressure Injection technique: single-shot Needle Needle type: Spinocan  Needle gauge: 22 G Needle length: 9 cm Additional Notes Expiration date of kit checked and confirmed. Patient tolerated procedure well, without complications.

## 2013-02-19 NOTE — Anesthesia Preprocedure Evaluation (Addendum)
Anesthesia Evaluation  Patient identified by MRN, date of birth, ID band Patient awake    Reviewed: Allergy & Precautions, H&P , NPO status , Patient's Chart, lab work & pertinent test results  Airway Mallampati: II TM Distance: >3 FB Neck ROM: Full    Dental  (+) Edentulous Upper, Edentulous Lower and Dental Advisory Given   Pulmonary asthma , pneumonia -, former smoker,  + rhonchi         Cardiovascular hypertension, Pt. on medications Rhythm:Regular Rate:Normal     Neuro/Psych negative neurological ROS  negative psych ROS   GI/Hepatic Neg liver ROS, GERD-  ,  Endo/Other  negative endocrine ROS  Renal/GU negative Renal ROS  negative genitourinary   Musculoskeletal negative musculoskeletal ROS (+)   Abdominal   Peds  Hematology negative hematology ROS (+)   Anesthesia Other Findings   Reproductive/Obstetrics                         Anesthesia Physical Anesthesia Plan  ASA: II  Anesthesia Plan: Spinal   Post-op Pain Management:    Induction: Intravenous  Airway Management Planned: Simple Face Mask  Additional Equipment:   Intra-op Plan:   Post-operative Plan:   Informed Consent: I have reviewed the patients History and Physical, chart, labs and discussed the procedure including the risks, benefits and alternatives for the proposed anesthesia with the patient or authorized representative who has indicated his/her understanding and acceptance.   Dental advisory given  Plan Discussed with: CRNA  Anesthesia Plan Comments:         Anesthesia Quick Evaluation

## 2013-02-19 NOTE — Preoperative (Signed)
Beta Blockers   Reason not to administer Beta Blockers:Not Applicable 

## 2013-02-19 NOTE — Op Note (Addendum)
Pre-operative diagnosis- Osteoarthritis  Bilateral knee(s)  Post-operative diagnosis- Osteoarthritis Bilateral knee(s)  Procedure-  Right  Total Knee Arthroplasty   Left Knee cortisone injection  Surgeon- Natasha Fletcher. Natasha Vonbehren, MD  Assistant- Natasha Peace, PA-C   Anesthesia-  Spinal EBL-* No blood loss amount entered *  Drains Hemovac  Tourniquet time-  Total Tourniquet Time Documented: Thigh (Right) - 34 minutes Total: Thigh (Right) - 34 minutes    Complications- None  Condition-PACU - hemodynamically stable.   Brief Clinical Note  Natasha Fletcher is a 79 y.o. year old female with end stage OA of her right knee with progressively worsening pain and dysfunction. She has constant pain, with activity and at rest and significant functional deficits with difficulties even with ADLs. She has had extensive non-op management including analgesics, injections of cortisone and viscosupplements, and home exercise program, but remains in significant pain with significant dysfunction.Radiographs show bone on bone arthritis all 3 compartments severe in nature.. She presents now for right Total Knee Arthroplasty and also requests left knee cortisone injection for severe symptomatic osteoarthritis.   Procedure in detail---   The patient is brought into the operating room and positioned supine on the operating table. After successful administration of  Spinal,   a tourniquet is placed high on the  Right thigh(s) and the lower extremity is prepped and draped in the usual sterile fashion. Time out is performed by the operating team and then the  Right lower extremity is wrapped in Esmarch, knee flexed and the tourniquet inflated to 300 mmHg.       A midline incision is made with a ten blade through the subcutaneous tissue to the level of the extensor mechanism. A fresh blade is used to make a medial parapatellar arthrotomy. Soft tissue over the proximal medial tibia is subperiosteally elevated to the joint line  with a knife and into the semimembranosus bursa with a Cobb elevator. Soft tissue over the proximal lateral tibia is elevated with attention being paid to avoiding the patellar tendon on the tibial tubercle. The patella is everted, knee flexed 90 degrees and the ACL and PCL are removed. Findings are bone on bone all 3 compartments with severe patellofemoral erosion and grooving of the bone.        The drill is used to create a starting hole in the distal femur and the canal is thoroughly irrigated with sterile saline to remove the fatty contents. The 5 degree Right  valgus alignment guide is placed into the femoral canal and the distal femoral cutting block is pinned to remove 10 mm off the distal femur. Resection is made with an oscillating saw.      The tibia is subluxed forward and the menisci are removed. The extramedullary alignment guide is placed referencing proximally at the medial aspect of the tibial tubercle and distally along the second metatarsal axis and tibial crest. The block is pinned to remove 2mm off the more deficient medial  side. Resection is made with an oscillating saw. Size 3is the most appropriate size for the tibia and the proximal tibia is prepared with the modular drill and keel punch for that size.      The femoral sizing guide is placed and size 3 is most appropriate. Rotation is marked off the epicondylar axis and confirmed by creating a rectangular flexion gap at 90 degrees. The size 3 cutting block is pinned in this rotation and the anterior, posterior and chamfer cuts are made with the oscillating saw. The  intercondylar block is then placed and that cut is made.      Trial size 3 tibial component, trial size 3 posterior stabilized femur and a 10  mm posterior stabilized rotating platform insert trial is placed. Full extension is achieved with excellent varus/valgus and anterior/posterior balance throughout full range of motion. The patella is everted and thickness measured to  be 15  mm. Free hand resection is taken to 10 mm, a 38 template is placed, lug holes are drilled, trial patella is placed, and it tracks normally. Osteophytes are removed off the posterior femur with the trial in place. All trials are removed and the cut bone surfaces prepared with pulsatile lavage. Cement is mixed and once ready for implantation, the size 3 tibial implant, size  3 posterior stabilized femoral component, and the size 38 patella are cemented in place and the patella is held with the clamp. The trial insert is placed and the knee held in full extension. The Exparel (20 ml mixed with 30 ml saline) and .25% Bupivicaine, are injected into the extensor mechanism, posterior capsule, medial and lateral gutters and subcutaneous tissues.  All extruded cement is removed and once the cement is hard the permanent 10 mm posterior stabilized rotating platform insert is placed into the tibial tray.      The wound is copiously irrigated with saline solution and the extensor mechanism closed over a hemovac drain with #1 PDS suture. The tourniquet is released for a total tourniquet time of 34  minutes. Flexion against gravity is 120 degrees and the patella tracks normally. Subcutaneous tissue is closed with 2.0 vicryl and subcuticular with running 4.0 Monocryl. The incision is cleaned and dried and steri-strips and a bulky sterile dressing are applied. The limb is placed into a knee immobilizer. I then prepped her left knee with Betadine and injected it with 80 mg ( 2 ml) of Depomedrol.  The patient is then awakened and transported to recovery in stable condition.      Please note that a surgical assistant was a medical necessity for this procedure in order to perform it in a safe and expeditious manner. Surgical assistant was necessary to retract the ligaments and vital neurovascular structures to prevent injury to them and also necessary for proper positioning of the limb to allow for anatomic placement of the  prosthesis.   Natasha Fletcher Natasha Winders, MD    02/19/2013, 2:57 PM

## 2013-02-19 NOTE — Interval H&P Note (Signed)
History and Physical Interval Note:  02/19/2013 9:46 AM  Natasha Fletcher  has presented today for surgery, with the diagnosis of OA RIGHT KNEE  The various methods of treatment have been discussed with the patient and family. After consideration of risks, benefits and other options for treatment, the patient has consented to  Procedure(s): RIGHT TOTAL KNEE ARTHROPLASTY (Right) as a surgical intervention .  The patient's history has been reviewed, patient examined, no change in status, stable for surgery.  I have reviewed the patient's chart and labs.  Questions were answered to the patient's satisfaction.     Loanne Drilling

## 2013-02-19 NOTE — Transfer of Care (Signed)
Immediate Anesthesia Transfer of Care Note  Patient: Natasha Fletcher  Procedure(s) Performed: Procedure(s): RIGHT TOTAL KNEE ARTHROPLASTY (Right)  Patient Location: PACU  Anesthesia Type:Spinal  Level of Consciousness: awake, alert  and oriented  Airway & Oxygen Therapy: Patient Spontanous Breathing and Patient connected to face mask oxygen  Post-op Assessment: Report given to PACU RN and Post -op Vital signs reviewed and stable  Post vital signs: Reviewed and stable  Complications: No apparent anesthesia complications

## 2013-02-20 ENCOUNTER — Encounter (HOSPITAL_COMMUNITY): Payer: Self-pay | Admitting: Orthopedic Surgery

## 2013-02-20 DIAGNOSIS — D62 Acute posthemorrhagic anemia: Secondary | ICD-10-CM

## 2013-02-20 LAB — BASIC METABOLIC PANEL
BUN: 15 mg/dL (ref 6–23)
Chloride: 105 mEq/L (ref 96–112)
Creatinine, Ser: 0.95 mg/dL (ref 0.50–1.10)
GFR calc Af Amer: 62 mL/min — ABNORMAL LOW (ref >90)
GFR calc non Af Amer: 53 mL/min — ABNORMAL LOW (ref >90)
Glucose, Bld: 151 mg/dL — ABNORMAL HIGH (ref 70–99)
Potassium: 4.1 mEq/L (ref 3.5–5.1)

## 2013-02-20 LAB — CBC
HCT: 29.4 % — ABNORMAL LOW (ref 36.0–46.0)
Hemoglobin: 9.6 g/dL — ABNORMAL LOW (ref 12.0–15.0)
MCHC: 32.7 g/dL (ref 30.0–36.0)
MCV: 84.7 fL (ref 78.0–100.0)

## 2013-02-20 MED ORDER — FUROSEMIDE 20 MG PO TABS
20.0000 mg | ORAL_TABLET | Freq: Every morning | ORAL | Status: DC
  Administered 2013-02-21 – 2013-02-22 (×2): 20 mg via ORAL
  Filled 2013-02-20 (×2): qty 1

## 2013-02-20 MED ORDER — FUROSEMIDE 10 MG/ML IJ SOLN
20.0000 mg | Freq: Once | INTRAMUSCULAR | Status: AC
  Administered 2013-02-20: 20 mg via INTRAVENOUS
  Filled 2013-02-20: qty 2

## 2013-02-20 MED ORDER — ALUM & MAG HYDROXIDE-SIMETH 200-200-20 MG/5ML PO SUSP
30.0000 mL | ORAL | Status: DC | PRN
  Administered 2013-02-20 – 2013-02-22 (×2): 30 mL via ORAL
  Filled 2013-02-20 (×2): qty 30

## 2013-02-20 NOTE — Progress Notes (Signed)
Utilization review completed.  

## 2013-02-20 NOTE — Anesthesia Postprocedure Evaluation (Signed)
Anesthesia Post Note  Patient: Natasha Fletcher  Procedure(s) Performed: Procedure(s) (LRB): RIGHT TOTAL KNEE ARTHROPLASTY (Right)  Anesthesia type: Spinal  Patient location: PACU  Post pain: Pain level controlled  Post assessment: Post-op Vital signs reviewed  Last Vitals:  Filed Vitals:   02/20/13 0613  BP: 117/50  Pulse: 57  Temp: 36.8 C  Resp: 16    Post vital signs: Reviewed  Level of consciousness: sedated  Complications: No apparent anesthesia complications

## 2013-02-20 NOTE — Evaluation (Signed)
Occupational Therapy Evaluation Patient Details Name: Natasha Fletcher MRN: [id number] DOB: 08/08/1925 Today's Date: 02/20/2013 Time: 4098-1191 OT Time Calculation (min): 25 min  OT Assessment / Plan / Recommendation History of present illness RTKA, L knee injection   Clinical Impression   Pt was admitted for the above surgery.  She will benefit from skilled OT to increase safety and independence with adls related to adls.  Pt moving slowly but motivated and hopes to return home with sister helping her.  Goals in acute are min A level.      OT Assessment  Patient needs continued OT Services    Follow Up Recommendations  SNF;Home health OT;Supervision/Assistance - 24 hour (depending upon progress)    Barriers to Discharge      Equipment Recommendations  3 in 1 bedside comode    Recommendations for Other Services    Frequency  Min 2X/week    Precautions / Restrictions Precautions Precautions: Knee;Fall Required Braces or Orthoses: Knee Immobilizer - Right Knee Immobilizer - Right: Discontinue once straight leg raise with < 10 degree lag Restrictions Weight Bearing Restrictions: No   Pertinent Vitals/Pain R knee 7'/10. Repositioned with ice    ADL  Grooming: Set up Where Assessed - Grooming: Supported sitting Upper Body Bathing: Set up Where Assessed - Upper Body Bathing: Supported sitting Lower Body Bathing: +2 Total assistance Lower Body Bathing: Patient Percentage: 50% Where Assessed - Lower Body Bathing: Supported sit to stand Upper Body Dressing: Set up Where Assessed - Upper Body Dressing: Supported sitting Lower Body Dressing: +2 Total assistance Lower Body Dressing: Patient Percentage: 10% Where Assessed - Lower Body Dressing: Supported sit to Pharmacist, hospital: +2 Total assistance Toilet Transfer: Patient Percentage: 60% Statistician Method: Sit to Barista: Materials engineer and Hygiene: +2 Total  assistance Toileting - Architect and Hygiene: Patient Percentage: 40% Where Assessed - Engineer, mining and Hygiene: Sit to stand from 3-in-1 or toilet Equipment Used: Rolling walker Transfers/Ambulation Related to ADLs: pt has a low bed at home ADL Comments: sister will assist with adls.  Pt does not have AE    OT Diagnosis: Generalized weakness  OT Problem List: Decreased strength;Decreased activity tolerance;Decreased knowledge of use of DME or AE;Pain OT Treatment Interventions: Self-care/ADL training;DME and/or AE instruction;Patient/family education   OT Goals(Current goals can be found in the care plan section) Acute Rehab OT Goals Patient Stated Goal: go home OT Goal Formulation: With patient Time For Goal Achievement: 02/27/13 Potential to Achieve Goals: Good ADL Goals Pt Will Transfer to Toilet: with min assist;ambulating;bedside commode Pt Will Perform Toileting - Clothing Manipulation and hygiene: with min assist;sit to/from stand Additional ADL Goal #1: pt will go from sit to stand for adls with min A   Visit Information  Last OT Received On: 02/20/13 Assistance Needed: +2 PT/OT Co-Evaluation/Treatment: Yes History of Present Illness: RTKA, L knee injection       Prior Functioning     Home Living Family/patient expects to be discharged to:: Private residence Living Arrangements: Other relatives (sister) Available Help at Discharge: Family Type of Home: House Home Access: Stairs to enter Secretary/administrator of Steps: 1 Entrance Stairs-Rails: None Home Layout: Two level;Able to live on main level with bedroom/bathroom Home Equipment: Gilmer Mor - single point;Tub bench Prior Function Level of Independence: Independent with assistive device(s) Comments: sister lives upstairs in duplex and and stay with her as needed.  other family nearby Communication Communication: No difficulties Dominant Hand: Right  Vision/Perception      Cognition  Cognition Arousal/Alertness: Awake/alert Behavior During Therapy: WFL for tasks assessed/performed Overall Cognitive Status: Within Functional Limits for tasks assessed    Extremity/Trunk Assessment Upper Extremity Assessment Upper Extremity Assessment: Generalized weakness (has h/o L RCR. Pain in bil UEs above 90)     Mobility Bed Mobility Bed Mobility: Supine to Sit Supine to Sit: 3: Mod assist;HOB elevated;With rails Details for Bed Mobility Assistance: cues for technique Transfers Transfers: Sit to Stand Sit to Stand: 1: +2 Total assist Sit to Stand: Patient Percentage: 50% (to 60%) Details for Transfer Assistance: cues for UE/LE placement     Exercise     Balance     End of Session OT - End of Session Activity Tolerance: Patient tolerated treatment well Patient left: in chair;with call bell/phone within reach;with family/visitor present CPM Right Knee CPM Right Knee: Off  GO     Kaniel Kiang 02/20/2013, 1:11 PM Marica Otter, OTR/L [phone] 02/20/2013

## 2013-02-20 NOTE — Evaluation (Signed)
Physical Therapy Evaluation Patient Details Name: Natasha Fletcher Fielden MRN: [id number] DOB: [birth year] Today's Date: 02/20/2013 Time: 1100-1140 PT Time Calculation (min): 40 min  PT Assessment / Plan / Recommendation History of Present Illness  RTKA, L knee injection  Clinical Impression  Pt ambulated short distance. Pt reports sister will be caregiver, who lives upstairs. Will see how pt progresses to determine safe DC to home vs. Needing SNF. Pt will benefit from PT to address problems listed.    PT Assessment  Patient needs continued PT services    Follow Up Recommendations  Home health PT (may need SNF, depending on progress.)    Does the patient have the potential to tolerate intense rehabilitation      Barriers to Discharge Decreased caregiver support      Equipment Recommendations  Rolling walker with 5" wheels    Recommendations for Other Services     Frequency 7X/week    Precautions / Restrictions Precautions Precautions: Knee;Fall Required Braces or Orthoses: Knee Immobilizer - Right Knee Immobilizer - Right: Discontinue once straight leg raise with < 10 degree lag Restrictions Weight Bearing Restrictions: No   Pertinent Vitals/Pain  4-7 when walking. Ice applied.      Mobility  Bed Mobility Bed Mobility: Supine to Sit Supine to Sit: 3: Mod assist;HOB elevated;With rails Details for Bed Mobility Assistance: cues for technique Transfers Sit to Stand: 1: +2 Total assist Sit to Stand: Patient Percentage: 50% (to 60%) Details for Transfer Assistance: cues for UE/LE placement Ambulation/Gait Ambulation/Gait Assistance: 1: +2 Total assist Ambulation/Gait: Patient Percentage: 60% Ambulation Distance (Feet): 15 Feet Assistive device: Rolling walker Ambulation/Gait Assistance Details: cues for posture, sequence, pt tends to lean over forward . Gait Pattern: Step-to pattern;Decreased stride length;Decreased step length - right;Decreased stance time - right Gait  velocity: slow    Exercises     PT Diagnosis: Acute pain;Difficulty walking  PT Problem List: Decreased strength;Decreased range of motion;Decreased activity tolerance;Decreased mobility;Decreased knowledge of use of DME;Decreased safety awareness;Decreased knowledge of precautions;Decreased balance;Pain PT Treatment Interventions: DME instruction;Gait training;Stair training;Functional mobility training;Therapeutic activities;Therapeutic exercise;Patient/family education     PT Goals(Current goals can be found in the care plan section) Acute Rehab PT Goals Patient Stated Goal: go home PT Goal Formulation: With patient Time For Goal Achievement: 02/27/13 Potential to Achieve Goals: Good  Visit Information  Last PT Received On: 02/20/13 Assistance Needed: +2 History of Present Illness: RTKA, L knee injection       Prior Functioning  Home Living Family/patient expects to be discharged to:: Private residence Living Arrangements: Other relatives (sister) Available Help at Discharge: Family Type of Home: House Home Access: Stairs to enter Secretary/administrator of Steps: 1 Entrance Stairs-Rails: None Home Layout: Two level;Able to live on main level with bedroom/bathroom Home Equipment: Gilmer Mor - single point;Tub bench Prior Function Level of Independence: Independent with assistive device(s) Comments: sister lives upstairs in duplex and and stay with her as needed.  other family nearby Communication Communication: No difficulties Dominant Hand: Right    Cognition  Cognition Arousal/Alertness: Awake/alert Behavior During Therapy: WFL for tasks assessed/performed Overall Cognitive Status: Within Functional Limits for tasks assessed    Extremity/Trunk Assessment Upper Extremity Assessment Upper Extremity Assessment: Generalized weakness (has h/o L RCR. Pain in bil UEs above 90) Lower Extremity Assessment Lower Extremity Assessment: RLE deficits/detail RLE Deficits / Details:  SLR with min assist.   Balance    End of Session PT - End of Session Equipment Utilized During Treatment: Right knee immobilizer Activity Tolerance:  Patient limited by fatigue Patient left: in chair;with call bell/phone within reach;with family/visitor present Nurse Communication: Mobility status CPM Right Knee CPM Right Knee: Off  GP     [name] 02/20/2013, 1:36 PM Blanchard Kelch PT [phone]

## 2013-02-20 NOTE — Progress Notes (Signed)
Physical Therapy Treatment Patient Details Name: Natasha Fletcher MRN: [id number] DOB: 10-07-1925 Today's Date: 02/20/2013 Time: 8756-4332 PT Time Calculation (min): 15 min  PT Assessment / Plan / Recommendation  History of Present Illness RTKA, L knee injection   PT Comments   Pt in severe pain. RN notified. Pt unable to tolerate ROM at this time. Pt may benefit from ST SNF.  Follow Up Recommendations  SNF     Does the patient have the potential to tolerate intense rehabilitation     Barriers to Discharge Decreased caregiver support      Equipment Recommendations  Rolling walker with 5" wheels    Recommendations for Other Services    Frequency 7X/week   Progress towards PT Goals    Plan Discharge plan needs to be updated    Precautions / Restrictions Precautions Precautions: Knee;Fall Required Braces or Orthoses: Knee Immobilizer - Right Knee Immobilizer - Right: Discontinue once straight leg raise with < 10 degree lag Restrictions Weight Bearing Restrictions: No   Pertinent Vitals/Pain 10 R knee    Mobility  Bed Mobility Bed Mobility: Not assessed Supine to Sit: 3: Mod assist;HOB elevated;With rails Details for Bed Mobility Assistance: cues for technique Transfers Sit to Stand: 1: +2 Total assist Sit to Stand: Patient Percentage: 50% (to 60%) Details for Transfer Assistance: cues for UE/LE placement Ambulation/Gait Ambulation/Gait Assistance: 1: +2 Total assist Ambulation/Gait: Patient Percentage: 60% Ambulation Distance (Feet): 15 Feet Assistive device: Rolling walker Ambulation/Gait Assistance Details: cues for posture, sequence, pt tends to lean over forward . Gait Pattern: Step-to pattern;Decreased stride length;Decreased step length - right;Decreased stance time - right Gait velocity: slow    Exercises     PT Diagnosis: Acute pain;Difficulty walking  PT Problem List: Decreased strength;Decreased range of motion;Decreased activity tolerance;Decreased  mobility;Decreased knowledge of use of DME;Decreased safety awareness;Decreased knowledge of precautions;Decreased balance;Pain PT Treatment Interventions: DME instruction;Gait training;Stair training;Functional mobility training;Therapeutic activities;Therapeutic exercise;Patient/family education   PT Goals (current goals can now be found in the care plan section) Acute Rehab PT Goals Patient Stated Goal: go home PT Goal Formulation: With patient Time For Goal Achievement: 02/27/13 Potential to Achieve Goals: Good  Visit Information  Last PT Received On: 02/20/13 Assistance Needed: +2 History of Present Illness: RTKA, L knee injection    Subjective Data  Patient Stated Goal: go home   Cognition  Cognition Arousal/Alertness: Awake/alert Behavior During Therapy: WFL for tasks assessed/performed Overall Cognitive Status: Within Functional Limits for tasks assessed    Balance     End of Session PT - End of Session Equipment Utilized During Treatment: Right knee immobilizer Activity Tolerance: Patient limited by pain Patient left: in bed;with call bell/phone within reach Nurse Communication: Patient requests pain meds   GP     [name] 02/20/2013, 3:44 PM Blanchard Kelch PT [phone]

## 2013-02-20 NOTE — Progress Notes (Signed)
   Subjective: 1 Day Post-Op Procedure(s) (LRB): RIGHT TOTAL KNEE ARTHROPLASTY (Right) Patient reports pain as mild.   Patient seen in rounds with Dr. Lequita Halt.  Rough night but better this morning. Patient is well, but has had some minor complaints of pain in the knee, requiring pain medications We will start therapy today.  Plan is to go Home after hospital stay.  Objective: Vital signs in last 24 hours: Temp:  [97.5 F (36.4 C)-98.5 F (36.9 C)] 98.3 F (36.8 C) (11/18 1191) Pulse Rate:  [51-98] 57 (11/18 0613) Resp:  [15-21] 16 (11/18 0613) BP: (104-156)/(50-98) 117/50 mmHg (11/18 0613) SpO2:  [98 %-100 %] 100 % (11/18 4782) Weight:  [73.029 kg (161 lb)] 73.029 kg (161 lb) (11/17 1647)  Intake/Output from previous day:  Intake/Output Summary (Last 24 hours) at 02/20/13 0838 Last data filed at 02/20/13 0651  Gross per 24 hour  Intake 3738.75 ml  Output   1020 ml  Net 2718.75 ml    Intake/Output this shift: UOP 200 since MN by chart +2718  Labs:  Recent Labs  02/20/13 0408  HGB 9.6*    Recent Labs  02/20/13 0408  WBC 9.5  RBC 3.47*  HCT 29.4*  PLT 234    Recent Labs  02/20/13 0408  NA 138  K 4.1  CL 105  CO2 25  BUN 15  CREATININE 0.95  GLUCOSE 151*  CALCIUM 9.0   No results found for this basename: LABPT, INR,  in the last 72 hours  EXAM General - Patient is Alert, Appropriate and Oriented Extremity - Neurovascular intact Sensation intact distally Dorsiflexion/Plantar flexion intact Dressing - dressing C/D/I Motor Function - intact, moving foot and toes well on exam.  Hemovac pulled without difficulty.  Past Medical History  Diagnosis Date  . Hypertension   . Asthma   . Glaucoma   . Incontinence   . Wears dentures   . Wears glasses   . Thyroid disease     "something years ago"  . GERD (gastroesophageal reflux disease)   . Cancer     rt breast  . Edema leg   . Fast breathing     "because of pill"  . Pneumonia     h/o younger   . Arthritis     "all over"    Assessment/Plan: 1 Day Post-Op Procedure(s) (LRB): RIGHT TOTAL KNEE ARTHROPLASTY (Right) Principal Problem:   OA (osteoarthritis) of knee Active Problems:   Hypertension   Postoperative anemia due to acute blood loss  Estimated body mass index is 26 kg/(m^2) as calculated from the following:   Height as of this encounter: 5\' 6"  (1.676 m).   Weight as of this encounter: 73.029 kg (161 lb). Advance diet Up with therapy Plan for discharge tomorrow Discharge home with home health  DVT Prophylaxis - Xarelto Weight-Bearing as tolerated to right leg D/C O2 and Pulse OX and try on Room Air Low UOP and positive volume.  Will diurese fluids today.  Takes Lasix PO daily.  Will give IV dose this morning and resume PO tomorrow.  Lisinopril/HCTZ on hold for now.  Pressure is soft but okay on morning rounds.  Fluids if low pressures.  Will ss how she does today getting up.  Patrica Duel 02/20/2013, 8:38 AM

## 2013-02-21 DIAGNOSIS — N390 Urinary tract infection, site not specified: Secondary | ICD-10-CM

## 2013-02-21 LAB — BASIC METABOLIC PANEL
BUN: 16 mg/dL (ref 6–23)
Calcium: 9.1 mg/dL (ref 8.4–10.5)
GFR calc non Af Amer: 54 mL/min — ABNORMAL LOW (ref >90)
Glucose, Bld: 112 mg/dL — ABNORMAL HIGH (ref 70–99)

## 2013-02-21 LAB — CBC
HCT: 29.3 % — ABNORMAL LOW (ref 36.0–46.0)
Hemoglobin: 9.3 g/dL — ABNORMAL LOW (ref 12.0–15.0)
MCH: 27 pg (ref 26.0–34.0)
MCHC: 31.7 g/dL (ref 30.0–36.0)
RDW: 14.9 % (ref 11.5–15.5)

## 2013-02-21 MED ORDER — AMPICILLIN 500 MG PO CAPS
500.0000 mg | ORAL_CAPSULE | Freq: Two times a day (BID) | ORAL | Status: DC
  Administered 2013-02-21 – 2013-02-22 (×3): 500 mg via ORAL
  Filled 2013-02-21 (×6): qty 1

## 2013-02-21 NOTE — Progress Notes (Signed)
Clinical Social Work Department CLINICAL SOCIAL WORK PLACEMENT NOTE 02/21/2013  Patient:  Natasha Fletcher, Natasha Fletcher  Account Number:  [id number] Admit date:  02/19/2013  Clinical Social Worker:  Cori Razor, LCSW  Date/time:  02/21/2013 03:26 PM  Clinical Social Work is seeking post-discharge placement for this patient at the following level of care:   SKILLED NURSING   (*CSW will update this form in Epic as items are completed)   02/21/2013  Patient/family provided with [facility] Department of Clinical Social Work's list of facilities offering this level of care within the geographic area requested by the patient (or if unable, by the patient's family).  02/21/2013  Patient/family informed of their freedom to choose among providers that offer the needed level of care, that participate in Medicare, Medicaid or managed care program needed by the patient, have an available bed and are willing to accept the patient.    Patient/family informed of [facility]' ownership interest in [facility], as well as of the fact that they are under no obligation to receive care at this facility.  PASARR submitted to EDS on 02/21/2013 PASARR number received from EDS on 02/21/2013  FL2 transmitted to all facilities in geographic area requested by pt/family on  02/21/2013 FL2 transmitted to all facilities within larger geographic area on   Patient informed that his/her managed care company has contracts with or will negotiate with  certain facilities, including the following:     Patient/family informed of bed offers received:  02/21/2013 Patient chooses bed at [facility] Physician recommends and patient chooses bed at    Patient to be transferred to [facility] on   Patient to be transferred to facility by   The following physician request were entered in Epic:   Additional Comments:  Cori Razor LCSW [phone]

## 2013-02-21 NOTE — Progress Notes (Signed)
Clinical Social Work Department BRIEF PSYCHOSOCIAL ASSESSMENT 02/21/2013  Patient:  Natasha, Fletcher     Account Number:  [id number]     Admit date:  02/19/2013  Clinical Social Worker:  Candie Chroman  Date/Time:  02/21/2013 03:02 PM  Referred by:  RN  Date Referred:  02/20/2013 Referred for  SNF Placement   Other Referral:   Interview type:  Patient Other interview type:    PSYCHOSOCIAL DATA Living Status:  FAMILY Admitted from facility:   Level of care:   Primary support name:  Patric Dykes Primary support relationship to patient:  SIBLING Degree of support available:   supportive    CURRENT CONCERNS Current Concerns  Post-Acute Placement   Other Concerns:    SOCIAL WORK ASSESSMENT / PLAN Pt is an 79 yr old female living at home prior to hospitalization. CSW met with pt / brother to assist with d/c planning. PT has recommended ST Rehab following hospital d/c. Pt / family are in agreement with this plan. SNF search has been initiated and bed offers provided. Pt has chosen [facility] for placement. CSW has requested authorization from [facility] for SNF placement and ambulance transport.   Assessment/plan status:  Psychosocial Support/Ongoing Assessment of Needs Other assessment/ plan:   Information/referral to community resources:   Insurance coverage for SNF placement and ambulance transport reviewed. SNF list with bed offers provided.    PATIENT'S/FAMILY'S RESPONSE TO PLAN OF CARE: " I'd like to have rehab at [facility] . It's close to my home and relatives of mine have been there. They liked it there".   Natasha Razor LCSW [phone]

## 2013-02-21 NOTE — Care Management Note (Signed)
    Page 1 of 1   02/21/2013     7:05:49 PM   CARE MANAGEMENT NOTE 02/21/2013  Patient:  Natasha Fletcher, Natasha Fletcher   Account Number:  [id number]  Date Initiated:  02/21/2013  Documentation initiated by:  Colleen Can  Subjective/Objective Assessment:   dx rt knee arthroplasty     Action/Plan:   Plans SNF rehab   Anticipated DC Date:  02/22/2013   Anticipated DC Plan:  SKILLED NURSING FACILITY  In-house referral  Clinical Social Worker      DC Planning Services  CM consult      Choice offered to / List presented to:             Status of service:  Completed, signed off Medicare Important Message given?  NA - LOS <3 / Initial given by admissions (If response is "NO", the following Medicare IM given date fields will be blank) Date Medicare IM given:   Date Additional Medicare IM given:    Discharge Disposition:    Per UR Regulation:    If discussed at Long Length of Stay Meetings, dates discussed:    Comments:

## 2013-02-21 NOTE — Progress Notes (Signed)
Physical Therapy Treatment Patient Details Name: Natasha Fletcher MRN: [id number] DOB: 16-Aug-1925 Today's Date: 02/21/2013 Time: 4098-1191 PT Time Calculation (min): 26 min  PT Assessment / Plan / Recommendation  History of Present Illness RTKA, L knee injection   PT Comments   POD # 2 am session.  Amb limited distance due to pain then performed TKR TE's.   Follow Up Recommendations  SNF     Does the patient have the potential to tolerate intense rehabilitation     Barriers to Discharge        Equipment Recommendations  Rolling walker with 5" wheels    Recommendations for Other Services    Frequency     Progress towards PT Goals Progress towards PT goals: Progressing toward goals  Plan Discharge plan needs to be updated    Precautions / Restrictions Precautions Precautions: Knee;Fall Required Braces or Orthoses: Knee Immobilizer - Right Knee Immobilizer - Right: Discontinue once straight leg raise with < 10 degree lag Restrictions Weight Bearing Restrictions: No    Pertinent Vitals/Pain C/o 8/10 pain Pre medicated     Mobility  Bed Mobility Bed Mobility: Not assessed Details for Bed Mobility Assistance: Pt OOB in recliner Transfers Transfers: Sit to Stand;Stand to Sit Sit to Stand: 1: +2 Total assist Sit to Stand: Patient Percentage: 50% Stand to Sit: 1: +2 Total assist Stand to Sit: Patient Percentage: 50% Details for Transfer Assistance: cues for UE/LE placement Ambulation/Gait Ambulation/Gait Assistance: 1: +2 Total assist Ambulation/Gait: Patient Percentage: 60% Ambulation Distance (Feet): 5 Feet Assistive device: Rolling walker Ambulation/Gait Assistance Details: 50% VC's on proper seqeuncing and upright posture.  Great difficulty advancing R LE. Gait Pattern: Step-to pattern;Decreased stride length;Decreased step length - right;Decreased stance time - right Gait velocity: slow    Exercises   Total Knee Replacement TE's 10 reps B LE ankle pumps 10  reps knee presses 10 reps heel slides  10 reps SAQ's 10 reps SLR's 10 reps ABD Followed by ICE    PT Goals (current goals can now be found in the care plan section)    Visit Information  Last PT Received On: 02/21/13 Assistance Needed: +2 History of Present Illness: RTKA, L knee injection    Subjective Data      Cognition       Balance     End of Session PT - End of Session Equipment Utilized During Treatment: Gait belt Activity Tolerance: Patient limited by pain Patient left: in chair;with call bell/phone within reach   Felecia Shelling  PTA [facility]  Acute  Rehab Pager      [phone]

## 2013-02-21 NOTE — Plan of Care (Signed)
Problem: Phase III Progression Outcomes Goal: Discharge plan remains appropriate-arrangements made Outcome: Progressing Pt now planning on a d/c to rehab

## 2013-02-21 NOTE — Progress Notes (Signed)
   Subjective: 2 Days Post-Op Procedure(s) (LRB): RIGHT TOTAL KNEE ARTHROPLASTY (Right) Patient reports pain as mild and moderate following therapy yesterday. Patient seen in rounds with Dr. Lequita Halt.  It is felt that the patient would best be served by going to a SNF for more therapy. Patient is well, but has had some minor complaints of pain in the knee, requiring pain medications Plan is to go Skilled nursing facility after hospital stay.  Objective: Vital signs in last 24 hours: Temp:  [97.4 F (36.3 C)-98.9 F (37.2 C)] 98.1 F (36.7 C) (11/19 0616) Pulse Rate:  [67-106] 84 (11/19 0616) Resp:  [16-18] 16 (11/19 0616) BP: (103-165)/(58-86) 165/81 mmHg (11/19 0616) SpO2:  [93 %-100 %] 93 % (11/19 0616)  Intake/Output from previous day:  Intake/Output Summary (Last 24 hours) at 02/21/13 1404 Last data filed at 02/20/13 1700  Gross per 24 hour  Intake    240 ml  Output      0 ml  Net    240 ml    Intake/Output this shift: +3438  Labs:  Recent Labs  02/20/13 0408 02/21/13 0412  HGB 9.6* 9.3*    Recent Labs  02/20/13 0408 02/21/13 0412  WBC 9.5 13.3*  RBC 3.47* 3.44*  HCT 29.4* 29.3*  PLT 234 282    Recent Labs  02/20/13 0408 02/21/13 0412  NA 138 137  K 4.1 3.3*  CL 105 101  CO2 25 26  BUN 15 16  CREATININE 0.95 0.94  GLUCOSE 151* 112*  CALCIUM 9.0 9.1   No results found for this basename: LABPT, INR,  in the last 72 hours  EXAM General - Patient is Alert, Appropriate and Oriented Extremity - Neurovascular intact Sensation intact distally Dorsiflexion/Plantar flexion intact No cellulitis present Dressing/Incision - clean, dry, no drainage, healing Motor Function - intact, moving foot and toes well on exam.   Past Medical History  Diagnosis Date  . Hypertension   . Asthma   . Glaucoma   . Incontinence   . Wears dentures   . Wears glasses   . Thyroid disease     "something years ago"  . GERD (gastroesophageal reflux disease)   .  Cancer     rt breast  . Edema leg   . Fast breathing     "because of pill"  . Pneumonia     h/o younger  . Arthritis     "all over"    Assessment/Plan: 2 Days Post-Op Procedure(s) (LRB): RIGHT TOTAL KNEE ARTHROPLASTY (Right) Principal Problem:   OA (osteoarthritis) of knee Active Problems:   Hypertension   Postoperative anemia due to acute blood loss   UTI (urinary tract infection)  Estimated body mass index is 26 kg/(m^2) as calculated from the following:   Height as of this encounter: 5\' 6"  (1.676 m).   Weight as of this encounter: 73.029 kg (161 lb). Up with therapy Plan for discharge tomorrow Discharge to SNF ABX added for UTI  DVT Prophylaxis - Xarelto Weight-Bearing as tolerated to right leg  Dhruti Ghuman 02/21/2013, 2:04 PM

## 2013-02-21 NOTE — Progress Notes (Signed)
Physical Therapy Treatment Patient Details Name: Natasha Fletcher MRN: [id number] DOB: 09-20-1925 Today's Date: 02/21/2013 Time: 4098-1191 PT Time Calculation (min): 23 min  PT Assessment / Plan / Recommendation  History of Present Illness RTKA, L knee injection   PT Comments   POD # 2 pm session.  Pt back in bed via nursing.  Performed TKR TE's and applied ICE.  Follow Up Recommendations  SNF     Does the patient have the potential to tolerate intense rehabilitation     Barriers to Discharge        Equipment Recommendations  Rolling walker with 5" wheels    Recommendations for Other Services    Frequency     Progress towards PT Goals Progress towards PT goals: Progressing toward goals  Plan Discharge plan needs to be updated    Precautions / Restrictions Precautions Precautions: Knee;Fall Required Braces or Orthoses: Knee Immobilizer - Right Knee Immobilizer - Right: Discontinue once straight leg raise with < 10 degree lag Restrictions Weight Bearing Restrictions: No    Pertinent Vitals/Pain C/o 8/10 pain ICE applied       Exercises   Total Knee Replacement TE's 10 reps B LE ankle pumps 10 reps knee presses 10 reps heel slides  10 reps SAQ's 10 reps SLR's 10 reps ABD Followed by ICE   Pt required increased time and rest breaks to control pain level  PT Goals (current goals can now be found in the care plan section)    Visit Information  Last PT Received On: 02/21/13 Assistance Needed: +2 History of Present Illness: RTKA, L knee injection    Subjective Data      Cognition       Balance     End of Session PT - End of Session Equipment Utilized During Treatment: Gait belt Activity Tolerance: Patient limited by pain Patient left: in bed;with call bell/phone within reach   Felecia Shelling  PTA [facility]  Acute  Rehab Pager      [phone]

## 2013-02-22 ENCOUNTER — Other Ambulatory Visit: Payer: Self-pay

## 2013-02-22 DIAGNOSIS — E876 Hypokalemia: Secondary | ICD-10-CM | POA: Diagnosis not present

## 2013-02-22 LAB — CBC
Hemoglobin: 8.7 g/dL — ABNORMAL LOW (ref 12.0–15.0)
MCH: 26.7 pg (ref 26.0–34.0)
MCHC: 31.9 g/dL (ref 30.0–36.0)
MCV: 83.7 fL (ref 78.0–100.0)
Platelets: 230 10*3/uL (ref 150–400)
RDW: 14.9 % (ref 11.5–15.5)

## 2013-02-22 MED ORDER — AMPICILLIN 500 MG PO CAPS: 500.0000 mg | ORAL_CAPSULE | Freq: Two times a day (BID) | ORAL | Status: DC

## 2013-02-22 MED ORDER — GUAIFENESIN ER 600 MG PO TB12: 600.0000 mg | ORAL_TABLET | Freq: Two times a day (BID) | ORAL | Status: DC

## 2013-02-22 MED ORDER — OXYCODONE HCL 5 MG PO TABS
5.0000 mg | ORAL_TABLET | ORAL | Status: DC | PRN
Start: 2013-02-22 — End: 2013-02-22

## 2013-02-22 MED ORDER — ALUM & MAG HYDROXIDE-SIMETH 200-200-20 MG/5ML PO SUSP: 30.0000 mL | ORAL | Status: DC | PRN

## 2013-02-22 MED ORDER — POTASSIUM CHLORIDE CRYS ER 20 MEQ PO TBCR
40.0000 meq | EXTENDED_RELEASE_TABLET | Freq: Every day | ORAL | Status: DC
  Administered 2013-02-22: 40 meq via ORAL
  Filled 2013-02-22: qty 2

## 2013-02-22 MED ORDER — ACETAMINOPHEN 325 MG PO TABS: 650.0000 mg | ORAL_TABLET | Freq: Four times a day (QID) | ORAL | Status: DC | PRN

## 2013-02-22 MED ORDER — BISACODYL 10 MG RE SUPP: 10.0000 mg | Freq: Every day | RECTAL | Status: DC | PRN

## 2013-02-22 MED ORDER — METHOCARBAMOL 500 MG PO TABS: 500.0000 mg | ORAL_TABLET | Freq: Four times a day (QID) | ORAL | Status: DC | PRN

## 2013-02-22 MED ORDER — ONDANSETRON HCL 4 MG PO TABS: 4.0000 mg | ORAL_TABLET | Freq: Four times a day (QID) | ORAL | Status: DC | PRN

## 2013-02-22 MED ORDER — TRAMADOL HCL 50 MG PO TABS: 50.0000 mg | ORAL_TABLET | Freq: Three times a day (TID) | ORAL | Status: DC | PRN

## 2013-02-22 MED ORDER — OXYCODONE HCL 5 MG PO TABS: 5.0000 mg | ORAL_TABLET | ORAL | Status: DC | PRN

## 2013-02-22 MED ORDER — POLYETHYLENE GLYCOL 3350 17 G PO PACK: 17.0000 g | PACK | Freq: Every day | ORAL | Status: DC | PRN

## 2013-02-22 MED ORDER — RIVAROXABAN 10 MG PO TABS: 10.0000 mg | ORAL_TABLET | Freq: Every day | ORAL | Status: DC

## 2013-02-22 MED ORDER — DSS 100 MG PO CAPS: 100.0000 mg | ORAL_CAPSULE | Freq: Two times a day (BID) | ORAL | Status: DC

## 2013-02-22 MED ORDER — METOCLOPRAMIDE HCL 5 MG PO TABS
5.0000 mg | ORAL_TABLET | Freq: Three times a day (TID) | ORAL | Status: DC | PRN
Start: 2013-02-22 — End: 2013-04-02

## 2013-02-22 NOTE — Addendum Note (Signed)
Addended by: Edison Simon C on: 02/22/2013 04:00 PM   Modules accepted: Orders

## 2013-02-22 NOTE — Progress Notes (Signed)
Clinical Social Work Department CLINICAL SOCIAL WORK PLACEMENT NOTE 02/22/2013  Patient:  Natasha Fletcher, Natasha Fletcher  Account Number:  [id number] Admit date:  02/19/2013  Clinical Social Worker:  Cori Razor, LCSW  Date/time:  02/21/2013 03:26 PM  Clinical Social Work is seeking post-discharge placement for this patient at the following level of care:   SKILLED NURSING   (*CSW will update this form in Epic as items are completed)   02/21/2013  Patient/family provided with [facility] Department of Clinical Social Work's list of facilities offering this level of care within the geographic area requested by the patient (or if unable, by the patient's family).  02/21/2013  Patient/family informed of their freedom to choose among providers that offer the needed level of care, that participate in Medicare, Medicaid or managed care program needed by the patient, have an available bed and are willing to accept the patient.    Patient/family informed of [facility]' ownership interest in [facility], as well as of the fact that they are under no obligation to receive care at this facility.  PASARR submitted to EDS on 02/21/2013 PASARR number received from EDS on 02/21/2013  FL2 transmitted to all facilities in geographic area requested by pt/family on  02/21/2013 FL2 transmitted to all facilities within larger geographic area on   Patient informed that his/her managed care company has contracts with or will negotiate with  certain facilities, including the following:     Patient/family informed of bed offers received:  02/21/2013 Patient chooses bed at [facility] & REHABILITATION Physician recommends and patient chooses bed at    Patient to be transferred to [facility] on  02/22/2013 Patient to be transferred to facility by [facility]  The following physician request were entered in Epic:   Additional Comments:  [employer] provided authorization for SNF  and ambulance transport. Pt is aware that co-payments may apply.

## 2013-02-22 NOTE — Progress Notes (Signed)
Report called to Virginia Crews RN [place]

## 2013-02-22 NOTE — Progress Notes (Signed)
Occupational Therapy Treatment Patient Details Name: Natasha Fletcher MRN: [id number] DOB: 07/12/1925 Today's Date: 02/22/2013 Time: 6295-2841 OT Time Calculation (min): 29 min  OT Assessment / Plan / Recommendation  History of present illness RTKA, L knee injection   OT comments  Pt progressing slowly.  Limited by nausea this session  Follow Up Recommendations  SNF    Barriers to Discharge       Equipment Recommendations  3 in 1 bedside comode    Recommendations for Other Services    Frequency Min 2X/week   Progress towards OT Goals Progress towards OT goals: Progressing toward goals (slowly)  Plan Discharge plan needs to be updated    Precautions / Restrictions Precautions Precautions: Knee;Fall Required Braces or Orthoses: Knee Immobilizer - Right Knee Immobilizer - Right: Discontinue once straight leg raise with < 10 degree lag Restrictions Weight Bearing Restrictions: No   Pertinent Vitals/Pain RLE 5/10; nauseas.  RN brought med for nausea; repositioned in chair with ice    ADL  Toilet Transfer: +2 Total assistance Toilet Transfer: Patient Percentage: 60% Toilet Transfer Method: Stand pivot Toilet Transfer Equipment: Bedside commode Toileting - Clothing Manipulation and Hygiene: +2 Total assistance Toileting - Clothing Manipulation and Hygiene: Patient Percentage: 0% Where Assessed - Toileting Clothing Manipulation and Hygiene: Sit to stand from 3-in-1 or toilet Transfers/Ambulation Related to ADLs: spt to 3:1 then chair brought behind her as she felt nauseas.  ADL Comments: Pt able to stand with mod A today; +2 for safety for hygiene as pt felt nauseas.      OT Diagnosis:    OT Problem List:   OT Treatment Interventions:     OT Goals(current goals can now be found in the care plan section)    Visit Information  Last OT Received On: 02/22/13 Assistance Needed: +2 History of Present Illness: RTKA, L knee injection    Subjective Data      Prior  Functioning       Cognition  Cognition Arousal/Alertness: Awake/alert Behavior During Therapy: WFL for tasks assessed/performed Overall Cognitive Status: Within Functional Limits for tasks assessed    Mobility  Bed Mobility Bed Mobility: Supine to Sit Supine to Sit: 3: Mod assist;With rails;HOB elevated Transfers Sit to Stand: 3: Mod assist;From bed;From chair/3-in-1;With upper extremity assist Details for Transfer Assistance: cues for UE and LE placement    Exercises      Balance     End of Session OT - End of Session Activity Tolerance:  (limited by nausea) Patient left: in chair;with call bell/phone within reach;with family/visitor present CPM Right Knee CPM Right Knee: Off  GO     Eman Rynders 02/22/2013, 9:51 AM Marica Otter, OTR/L [phone] 02/22/2013

## 2013-02-22 NOTE — Progress Notes (Signed)
Physical Therapy Treatment Patient Details Name: Natasha Fletcher MRN: [id number] DOB: Oct 03, 1925 Today's Date: 02/22/2013 Time: 4098-1191 PT Time Calculation (min): 25 min  PT Assessment / Plan / Recommendation  History of Present Illness RTKA, L knee injection   PT Comments   POD # 3 am session.  Amb limited distance in hallway then performed TKR TE's.   Follow Up Recommendations  SNF     Does the patient have the potential to tolerate intense rehabilitation     Barriers to Discharge        Equipment Recommendations       Recommendations for Other Services    Frequency 7X/week   Progress towards PT Goals Progress towards PT goals: Progressing toward goals  Plan      Precautions / Restrictions Precautions Precautions: Knee;Fall Precaution Comments: Instructed pt on KI use for amb Required Braces or Orthoses: Knee Immobilizer - Right Knee Immobilizer - Right: Discontinue once straight leg raise with < 10 degree lag Restrictions Weight Bearing Restrictions: No    Pertinent Vitals/Pain C/o 8/10 pain Pre medicated ICE applied    Mobility  Bed Mobility Bed Mobility: Not assessed Details for Bed Mobility Assistance: Pt OOB in recliner Transfers Transfers: Sit to Stand;Stand to Sit Sit to Stand: 3: Mod assist;From bed;From chair/3-in-1;With upper extremity assist Stand to Sit: 3: Mod assist;To chair/3-in-1 Details for Transfer Assistance: cues for UE and LE placement Ambulation/Gait Ambulation/Gait Assistance: 1: +2 Total assist Ambulation/Gait: Patient Percentage: 70% Ambulation Distance (Feet): 7 Feet Assistive device: Rolling walker Ambulation/Gait Assistance Details: increased time with difficulty weight shifting and advancing L LE.  Chair following behind. Gait Pattern: Step-to pattern;Decreased stride length;Decreased step length - right;Decreased stance time - right Gait velocity: slow    Exercises   Total Knee Replacement TE's 10 reps B LE ankle pumps 10  reps knee presses 10 reps heel slides  10 reps SAQ's 10 reps SLR's 10 reps ABD Followed by ICE   PT Goals (current goals can now be found in the care plan section)    Visit Information  Last PT Received On: 02/22/13 Assistance Needed: +1 History of Present Illness: RTKA, L knee injection    Subjective Data      Cognition       Balance     End of Session PT - End of Session Equipment Utilized During Treatment: Gait belt;Right knee immobilizer Activity Tolerance: Patient limited by pain Patient left: in chair;with call bell/phone within reach   Felecia Shelling  PTA [facility]  Acute  Rehab Pager      [phone]

## 2013-02-22 NOTE — Progress Notes (Signed)
   Subjective: 3 Days Post-Op Procedure(s) (LRB): RIGHT TOTAL KNEE ARTHROPLASTY (Right) Patient reports pain as mild.   Patient seen in rounds by Dr. Lequita Halt. Patient is well, and has had no acute complaints or problems Patient is ready to go to SN[facility] - [facility] today.  Objective: Vital signs in last 24 hours: Temp:  [98.4 F (36.9 C)-100.3 F (37.9 C)] 100.3 F (37.9 C) (11/20 0506) Pulse Rate:  [88-106] 106 (11/20 0506) Resp:  [16] 16 (11/20 0506) BP: (161-175)/(72-75) 175/75 mmHg (11/20 0506) SpO2:  [91 %-97 %] 97 % (11/20 0506)  Intake/Output from previous day:  Intake/Output Summary (Last 24 hours) at 02/22/13 0753 Last data filed at 02/21/13 1900  Gross per 24 hour  Intake    240 ml  Output      0 ml  Net    240 ml    Intake/Output this shift:    Labs:  Recent Labs  02/20/13 0408 02/21/13 0412 02/22/13 0418  HGB 9.6* 9.3* 8.7*    Recent Labs  02/21/13 0412 02/22/13 0418  WBC 13.3* 10.7*  RBC 3.44* 3.26*  HCT 29.3* 27.3*  PLT 282 230    Recent Labs  02/20/13 0408 02/21/13 0412  NA 138 137  K 4.1 3.3*  CL 105 101  CO2 25 26  BUN 15 16  CREATININE 0.95 0.94  GLUCOSE 151* 112*  CALCIUM 9.0 9.1   No results found for this basename: LABPT, INR,  in the last 72 hours  EXAM: General - Patient is Alert, Appropriate and Oriented Extremity - Neurovascular intact Sensation intact distally Dorsiflexion/Plantar flexion intact No cellulitis present Incision - clean, dry, no drainage, healing Motor Function - intact, moving foot and toes well on exam.   Assessment/Plan: 3 Days Post-Op Procedure(s) (LRB): RIGHT TOTAL KNEE ARTHROPLASTY (Right) Procedure(s) (LRB): RIGHT TOTAL KNEE ARTHROPLASTY (Right) Past Medical History  Diagnosis Date  . Hypertension   . Asthma   . Glaucoma   . Incontinence   . Wears dentures   . Wears glasses   . Thyroid disease     "something years ago"  . GERD (gastroesophageal reflux disease)   . Cancer     rt  breast  . Edema leg   . Fast breathing     "because of pill"  . Pneumonia     h/o younger  . Arthritis     "all over"   Principal Problem:   OA (osteoarthritis) of knee Active Problems:   Hypertension   Postoperative anemia due to acute blood loss   UTI (urinary tract infection)   Hypokalemia  Estimated body mass index is 26 kg/(m^2) as calculated from the following:   Height as of this encounter: 5\' 6"  (1.676 m).   Weight as of this encounter: 73.029 kg (161 lb). Up with therapy Discharge to SNF - [facility] Diet - Cardiac diet Follow up - in 2 weeks Activity - WBAT Disposition - Skilled nursing facility - [facility] Condition Upon Discharge - Good D/C Meds - See DC Summary DVT Prophylaxis - Xarelto  Kinte Trim 02/22/2013, 7:53 AM

## 2013-02-22 NOTE — Discharge Summary (Signed)
Physician Discharge Summary   Patient ID: Natasha Fletcher MRN: [id number] DOB/AGE: 77-Nov-1927 77 y.o.  Admit date: 02/19/2013 Discharge date: 02/22/2013  Primary Diagnosis:  Osteoarthritis Bilateral knee(s)  Admission Diagnoses:  Past Medical History  Diagnosis Date  . Hypertension   . Asthma   . Glaucoma   . Incontinence   . Wears dentures   . Wears glasses   . Thyroid disease     "something years ago"  . GERD (gastroesophageal reflux disease)   . Cancer     rt breast  . Edema leg   . Fast breathing     "because of pill"  . Pneumonia     h/o younger  . Arthritis     "all over"   Discharge Diagnoses:   Principal Problem:   OA (osteoarthritis) of knee Active Problems:   Hypertension   Postoperative anemia due to acute blood loss   UTI (urinary tract infection)   Hypokalemia  Estimated body mass index is 26 kg/(m^2) as calculated from the following:   Height as of this encounter: 5\' 6"  (1.676 m).   Weight as of this encounter: 73.029 kg (161 lb).  Procedure:  Procedure(s) (LRB): RIGHT TOTAL KNEE ARTHROPLASTY (Right)   Consults: None  HPI: Natasha Fletcher is a 79 y.o. year old female with end stage OA of her right knee with progressively worsening pain and dysfunction. She has constant pain, with activity and at rest and significant functional deficits with difficulties even with ADLs. She has had extensive non-op management including analgesics, injections of cortisone and viscosupplements, and home exercise program, but remains in significant pain with significant dysfunction.Radiographs show bone on bone arthritis all 3 compartments severe in nature.. She presents now for right Total Knee Arthroplasty and also requests left knee cortisone injection for severe symptomatic osteoarthritis.   Laboratory Data: Admission on 02/19/2013  Component Date Value Range Status  . WBC 02/20/2013 9.5  4.0 - 10.5 K/uL Final  . RBC 02/20/2013 3.47* 3.87 - 5.11 MIL/uL Final  .  Hemoglobin 02/20/2013 9.6* 12.0 - 15.0 g/dL Final  . HCT 52/84/1324 29.4* 36.0 - 46.0 % Final  . MCV 02/20/2013 84.7  78.0 - 100.0 fL Final  . MCH 02/20/2013 27.7  26.0 - 34.0 pg Final  . MCHC 02/20/2013 32.7  30.0 - 36.0 g/dL Final  . RDW 40/01/2724 14.8  11.5 - 15.5 % Final  . Platelets 02/20/2013 234  150 - 400 K/uL Final  . Sodium 02/20/2013 138  135 - 145 mEq/L Final  . Potassium 02/20/2013 4.1  3.5 - 5.1 mEq/L Final  . Chloride 02/20/2013 105  96 - 112 mEq/L Final  . CO2 02/20/2013 25  19 - 32 mEq/L Final  . Glucose, Bld 02/20/2013 151* 70 - 99 mg/dL Final  . BUN 36/64/4034 15  6 - 23 mg/dL Final  . Creatinine, Ser 02/20/2013 0.95  0.50 - 1.10 mg/dL Final  . Calcium 74/25/9563 9.0  8.4 - 10.5 mg/dL Final  . GFR calc non Af Amer 02/20/2013 53* >90 mL/min Final  . GFR calc Af Amer 02/20/2013 62* >90 mL/min Final   Comment: (NOTE)                          The eGFR has been calculated using the CKD EPI equation.                          This calculation  has not been validated in all clinical situations.                          eGFR's persistently <90 mL/min signify possible Chronic Kidney                          Disease.  . WBC 02/21/2013 13.3* 4.0 - 10.5 K/uL Final  . RBC 02/21/2013 3.44* 3.87 - 5.11 MIL/uL Final  . Hemoglobin 02/21/2013 9.3* 12.0 - 15.0 g/dL Final  . HCT 45/40/9811 29.3* 36.0 - 46.0 % Final  . MCV 02/21/2013 85.2  78.0 - 100.0 fL Final  . MCH 02/21/2013 27.0  26.0 - 34.0 pg Final  . MCHC 02/21/2013 31.7  30.0 - 36.0 g/dL Final  . RDW 91/47/8295 14.9  11.5 - 15.5 % Final  . Platelets 02/21/2013 282  150 - 400 K/uL Final  . Sodium 02/21/2013 137  135 - 145 mEq/L Final  . Potassium 02/21/2013 3.3* 3.5 - 5.1 mEq/L Final   Comment: REPEATED TO VERIFY                          DELTA CHECK NOTED  . Chloride 02/21/2013 101  96 - 112 mEq/L Final  . CO2 02/21/2013 26  19 - 32 mEq/L Final  . Glucose, Bld 02/21/2013 112* 70 - 99 mg/dL Final  . BUN 62/13/0865 16  6 - 23  mg/dL Final  . Creatinine, Ser 02/21/2013 0.94  0.50 - 1.10 mg/dL Final  . Calcium 78/46/9629 9.1  8.4 - 10.5 mg/dL Final  . GFR calc non Af Amer 02/21/2013 54* >90 mL/min Final  . GFR calc Af Amer 02/21/2013 62* >90 mL/min Final   Comment: (NOTE)                          The eGFR has been calculated using the CKD EPI equation.                          This calculation has not been validated in all clinical situations.                          eGFR's persistently <90 mL/min signify possible Chronic Kidney                          Disease.  . WBC 02/22/2013 10.7* 4.0 - 10.5 K/uL Final  . RBC 02/22/2013 3.26* 3.87 - 5.11 MIL/uL Final  . Hemoglobin 02/22/2013 8.7* 12.0 - 15.0 g/dL Final  . HCT 52/84/1324 27.3* 36.0 - 46.0 % Final  . MCV 02/22/2013 83.7  78.0 - 100.0 fL Final  . MCH 02/22/2013 26.7  26.0 - 34.0 pg Final  . MCHC 02/22/2013 31.9  30.0 - 36.0 g/dL Final  . RDW 40/01/2724 14.9  11.5 - 15.5 % Final  . Platelets 02/22/2013 230  150 - 400 K/uL Final  Hospital Outpatient Visit on 02/15/2013  Component Date Value Range Status  . MRSA, PCR 02/15/2013 NEGATIVE  NEGATIVE Final  . Staphylococcus aureus 02/15/2013 NEGATIVE  NEGATIVE Final   Comment:  The Xpert SA Assay (FDA                          approved for NASAL specimens                          in patients over 61 years of age),                          is one component of                          a comprehensive surveillance                          program.  Test performance has                          been validated by [employer] for patients greater                          than or equal to 62 year old.                          It is not intended                          to diagnose infection nor to                          guide or monitor treatment.  Marland Kitchen aPTT 02/15/2013 37  24 - 37 seconds Final   Comment:                                 IF BASELINE aPTT IS  ELEVATED,                          SUGGEST PATIENT RISK ASSESSMENT                          BE USED TO DETERMINE APPROPRIATE                          ANTICOAGULANT THERAPY.  . WBC 02/15/2013 7.6  4.0 - 10.5 K/uL Final  . RBC 02/15/2013 4.01  3.87 - 5.11 MIL/uL Final  . Hemoglobin 02/15/2013 11.1* 12.0 - 15.0 g/dL Final  . HCT 16/01/9603 34.1* 36.0 - 46.0 % Final  . MCV 02/15/2013 85.0  78.0 - 100.0 fL Final  . MCH 02/15/2013 27.7  26.0 - 34.0 pg Final  . MCHC 02/15/2013 32.6  30.0 - 36.0 g/dL Final  . RDW 54/12/8117 14.3  11.5 - 15.5 % Final  . Platelets 02/15/2013 335  150 - 400 K/uL Final  . Sodium 02/15/2013 134* 135 - 145 mEq/L Final  . Potassium 02/15/2013 3.6  3.5 - 5.1 mEq/L Final  . Chloride 02/15/2013 97  96 - 112 mEq/L Final  .  CO2 02/15/2013 26  19 - 32 mEq/L Final  . Glucose, Bld 02/15/2013 112* 70 - 99 mg/dL Final  . BUN 40/98/1191 12  6 - 23 mg/dL Final  . Creatinine, Ser 02/15/2013 1.01  0.50 - 1.10 mg/dL Final  . Calcium 47/82/9562 10.3  8.4 - 10.5 mg/dL Final  . Total Protein 02/15/2013 7.2  6.0 - 8.3 g/dL Final  . Albumin 13/11/6576 3.5  3.5 - 5.2 g/dL Final  . AST 46/96/2952 20  0 - 37 U/L Final  . ALT 02/15/2013 14  0 - 35 U/L Final  . Alkaline Phosphatase 02/15/2013 106  39 - 117 U/L Final  . Total Bilirubin 02/15/2013 0.8  0.3 - 1.2 mg/dL Final  . GFR calc non Af Amer 02/15/2013 49* >90 mL/min Final  . GFR calc Af Amer 02/15/2013 57* >90 mL/min Final   Comment: (NOTE)                          The eGFR has been calculated using the CKD EPI equation.                          This calculation has not been validated in all clinical situations.                          eGFR's persistently <90 mL/min signify possible Chronic Kidney                          Disease.  Marland Kitchen Prothrombin Time 02/15/2013 13.4  11.6 - 15.2 seconds Final  . INR 02/15/2013 1.04  0.00 - 1.49 Final  . ABO/RH(D) 02/15/2013 O POS   Final  . Antibody Screen 02/15/2013 NEG   Final  . Sample  Expiration 02/15/2013 02/22/2013   Final  . Color, Urine 02/15/2013 AMBER* YELLOW Final   BIOCHEMICALS MAY BE AFFECTED BY COLOR  . APPearance 02/15/2013 TURBID* CLEAR Final  . Specific Gravity, Urine 02/15/2013 1.020  1.005 - 1.030 Final  . pH 02/15/2013 6.0  5.0 - 8.0 Final  . Glucose, UA 02/15/2013 NEGATIVE  NEGATIVE mg/dL Final  . Hgb urine dipstick 02/15/2013 MODERATE* NEGATIVE Final  . Bilirubin Urine 02/15/2013 NEGATIVE  NEGATIVE Final  . Ketones, ur 02/15/2013 NEGATIVE  NEGATIVE mg/dL Final  . Protein, ur 84/13/2440 NEGATIVE  NEGATIVE mg/dL Final  . Urobilinogen, UA 02/15/2013 1.0  0.0 - 1.0 mg/dL Final  . Nitrite 01/30/2535 NEGATIVE  NEGATIVE Final  . Leukocytes, UA 02/15/2013 LARGE* NEGATIVE Final  . Squamous Epithelial / LPF 02/15/2013 RARE  RARE Final  . WBC, UA 02/15/2013 TOO NUMEROUS TO COUNT  <3 WBC/hpf Final  . RBC / HPF 02/15/2013 FIELD OBSCURED BY WBC'S  <3 RBC/hpf Final  . Bacteria, UA 02/15/2013 FEW* RARE Final  . Specimen Description 02/15/2013 URINE, RANDOM   Final  . Special Requests 02/15/2013 NONE   Final  . Culture  Setup Time 02/15/2013    Final                   Value:02/15/2013 18:09                         Performed at [facility]  . Culture 02/15/2013    Final  Value:>=100,000 COLONIES/mL ENTEROCOCCUS SPECIES                         Performed at [facility]  . Report Status 02/15/2013 02/17/2013 FINAL   Final  . Organism ID, Bacteria 02/15/2013 ENTEROCOCCUS SPECIES   Final  . ABO/RH(D) 02/15/2013 O POS   Final     X-Rays:Dg Chest 2 View  02/15/2013   CLINICAL DATA:  Preop.  EXAM: CHEST  2 VIEW  COMPARISON:  04/26/2011.  11/07/2009.  FINDINGS: Mediastinum and hilar structures are normal. Stable pleural thickening on the right consistent with scarring. Lungs are clear of acute infiltrates. Mild interstitial prominence is stable and most likely related to fibrosis. No pneumothorax. Heart size normal. Degenerative changes  and scoliosis thoracic spine.  IMPRESSION: Stable chest with changes of mild interstitial fibrosis and pleural parenchymal scarring. No acute cardiopulmonary disease.   Electronically Signed   By: Maisie Fus  Register   On: 02/15/2013 14:20    EKG: Orders placed during the hospital encounter of 02/15/13  . EKG 12-LEAD  . EKG 12-LEAD     Hospital Course: Natasha Fletcher is a 79 y.o. who was admitted to [facility]. They were brought to the operating room on 02/19/2013 and underwent Procedure(s): RIGHT TOTAL KNEE ARTHROPLASTY.  Patient tolerated the procedure well and was later transferred to the recovery room and then to the orthopaedic floor for postoperative care.  They were given PO and IV analgesics for pain control following their surgery.  They were given 24 hours of postoperative antibiotics of  Anti-infectives   Start     Dose/Rate Route Frequency Ordered Stop   02/22/13 0000  ampicillin (PRINCIPEN) 500 MG capsule     500 mg Oral Every 12 hours 02/22/13 0801     02/21/13 1000  ampicillin (PRINCIPEN) capsule 500 mg     500 mg Oral Every 12 hours 02/21/13 0940     02/19/13 2000  ceFAZolin (ANCEF) IVPB 1 g/50 mL premix     1 g 100 mL/hr over 30 Minutes Intravenous Every 6 hours 02/19/13 1653 02/20/13 0155   02/19/13 0830  ceFAZolin (ANCEF) IVPB 2 g/50 mL premix     2 g 100 mL/hr over 30 Minutes Intravenous On call to O.R. 02/19/13 0829 02/19/13 1350     and started on DVT prophylaxis in the form of Xarelto.   PT and OT were ordered for total joint protocol.  Discharge planning consulted to help with postop disposition and equipment needs.  Patient had a rough night on the evening of surgery with pain.  They started to get up OOB with therapy on day one. She was noted to have low UOP and positive volume. Diuresed fluids. Took Lasix PO daily. Gave IV dose that morning and resumed PO tomorrow. Lisinopril/HCTZ on hold temporarily. Pressure was soft but okay on morning rounds.  Hemovac  drain was pulled without difficulty.  Continued to work with therapy into day two and pressure was better.  ABX added for UTI.   Dressing was changed on day two and the incision was healing well.  By day three, the patient had progressed with therapy and meeting their goals.  Incision was healing well.  Patient was seen in rounds and was ready to go to the SNF - [facility].  Discharge Medications: Prior to Admission medications   Medication Sig Start Date End Date Taking? Authorizing Provider  anastrozole (ARIMIDEX) 1 MG tablet Take 1 mg by  mouth daily.   Yes Historical Provider, MD  furosemide (LASIX) 20 MG tablet Take 20 mg by mouth every morning.   Yes Historical Provider, MD  latanoprost (XALATAN) 0.005 % ophthalmic solution Place 1 drop into both eyes at bedtime.   Yes Historical Provider, MD  oxybutynin (DITROPAN) 5 MG tablet Take 5 mg by mouth 3 (three) times daily as needed. To prevent urinary frequency   Yes Historical Provider, MD  predniSONE (DELTASONE) 5 MG tablet Take 5 mg by mouth See admin instructions. Take 5mg  every 2-3 days   Yes Historical Provider, MD  acetaminophen (TYLENOL) 325 MG tablet Take 2 tablets (650 mg total) by mouth every 6 (six) hours as needed for mild pain (or Fever >/= 101). 02/22/13   Finnley Larusso, PA-C  alum & mag hydroxide-simeth (MAALOX/MYLANTA) 200-200-20 MG/5ML suspension Take 30 mLs by mouth as needed for indigestion or heartburn. 02/22/13   Zamiah Tollett, PA-C  ampicillin (PRINCIPEN) 500 MG capsule Take 1 capsule (500 mg total) by mouth every 12 (twelve) hours. BID to complete a 10 day course 02/22/13   Tyanne Derocher Julien Girt, PA-C  bisacodyl (DULCOLAX) 10 MG suppository Place 1 suppository (10 mg total) rectally daily as needed for moderate constipation. 02/22/13   Mace Weinberg, PA-C  docusate sodium 100 MG CAPS Take 100 mg by mouth 2 (two) times daily. 02/22/13   Katja Blue, PA-C  guaiFENesin (MUCINEX) 600 MG 12 hr tablet Take 1  tablet (600 mg total) by mouth 2 (two) times daily. 02/22/13   Jabir Dahlem Julien Girt, PA-C  lisinopril-hydrochlorothiazide (PRINZIDE,ZESTORETIC) 20-25 MG per tablet Take 1 tablet by mouth every morning.     Historical Provider, MD  methocarbamol (ROBAXIN) 500 MG tablet Take 1 tablet (500 mg total) by mouth every 6 (six) hours as needed for muscle spasms. 02/22/13   Samyak Sackmann Julien Girt, PA-C  metoCLOPramide (REGLAN) 5 MG tablet Take 1-2 tablets (5-10 mg total) by mouth every 8 (eight) hours as needed for nausea (if ondansetron (ZOFRAN) ineffective.). 02/22/13   Oneal Biglow, PA-C  ondansetron (ZOFRAN) 4 MG tablet Take 1 tablet (4 mg total) by mouth every 6 (six) hours as needed for nausea. 02/22/13   Niharika Savino, PA-C  oxyCODONE (OXY IR/ROXICODONE) 5 MG immediate release tablet Take 1-2 tablets (5-10 mg total) by mouth every 3 (three) hours as needed for breakthrough pain. 02/22/13   Shishir Krantz, PA-C  polyethylene glycol (MIRALAX / GLYCOLAX) packet Take 17 g by mouth daily as needed for mild constipation. 02/22/13   Clearance Chenault Julien Girt, PA-C  rivaroxaban (XARELTO) 10 MG TABS tablet Take 1 tablet (10 mg total) by mouth daily with breakfast. Take Xarelto for two and a half more weeks, then discontinue Xarelto. Once the patient has completed the blood thinner regimen, then take a Baby 81 mg Aspirin daily for four more weeks. 02/22/13   Patterson Hollenbaugh, PA-C  traMADol (ULTRAM) 50 MG tablet Take 1 tablet (50 mg total) by mouth 3 (three) times daily as needed (mild pain). For pain 02/22/13   Tomma Ehinger Julien Girt, PA-C   Discharge to SNF [facility]  Diet - Cardiac diet  Follow up - in 2 weeks  Activity - WBAT  Disposition - Skilled nursing facility - [facility]  Condition Upon Discharge - Good  D/C Meds - See DC Summary  DVT Prophylaxis - Xarelto      Discharge Orders   Future Appointments Provider Department Dept Phone   03/19/2013 10:15 AM [facility] [place]  IMAGING [phone]   Patient  should wear two piece clothing and wear no powder or deodorant. Patient should arrive 15 minutes early.   03/22/2013 12:00 PM Mariella Saa, MD [facility], [place] [phone]   06/28/2013 8:30 AM Mauri [facility] MEDICAL ONCOLOGY [phone]   06/28/2013 9:00 AM Victorino December, MD [facility] MEDICAL ONCOLOGY [phone]   Future Orders Complete By Expires   Call MD / Call 911  As directed    Comments:     If you experience chest pain or shortness of breath, CALL 911 and be transported to the hospital emergency room.  If you develope a fever above 101 F, pus (white drainage) or increased drainage or redness at the wound, or calf pain, call your surgeon's office.   Change dressing  As directed    Comments:     Change dressing daily with sterile 4 x 4 inch gauze dressing and apply TED hose. Do not submerge the incision under water.   Constipation Prevention  As directed    Comments:     Drink plenty of fluids.  Prune juice may be helpful.  You may use a stool softener, such as Colace (over the counter) 100 mg twice a day.  Use MiraLax (over the counter) for constipation as needed.   Diet - low sodium heart healthy  As directed    Discharge instructions  As directed    Comments:     Pick up stool softner and laxative for home. Do not submerge incision under water. May shower. Continue to use ice for pain and swelling from surgery. Take Xarelto for two and a half more weeks, then discontinue Xarelto. Once the patient has completed the blood thinner regimen, then take a Baby 81 mg Aspirin daily for four more weeks.  When discharged from the skilled rehab facility, please have the facility set up the patient's Home Health Physical Therapy prior to being released.  Also provide the patient with their medications at time of release from the facility to include their pain medication, the muscle  relaxants, and their blood thinner medication.  If the patient is still at the rehab facility at time of follow up appointment, please also assist the patient in arranging follow up appointment in our office and any transportation needs.   Do not put a pillow under the knee. Place it under the heel.  As directed    Do not sit on low chairs, stoools or toilet seats, as it may be difficult to get up from low surfaces  As directed    Driving restrictions  As directed    Comments:     No driving until released by the physician.   Increase activity slowly as tolerated  As directed    Lifting restrictions  As directed    Comments:     No lifting until released by the physician.   Patient may shower  As directed    Comments:     You may shower without a dressing once there is no drainage.  Do not wash over the wound.  If drainage remains, do not shower until drainage stops.   TED hose  As directed    Comments:     Use stockings (TED hose) for 3 weeks on both leg(s).  You may remove them at night for sleeping.   Weight bearing as tolerated  As directed    Questions:     Laterality:     Extremity:  Medication List    STOP taking these medications       CALCIUM + D PO     cholecalciferol 1000 UNITS tablet  Commonly known as:  VITAMIN D      TAKE these medications       acetaminophen 325 MG tablet  Commonly known as:  TYLENOL  Take 2 tablets (650 mg total) by mouth every 6 (six) hours as needed for mild pain (or Fever >/= 101).     alum & mag hydroxide-simeth 200-200-20 MG/5ML suspension  Commonly known as:  MAALOX/MYLANTA  Take 30 mLs by mouth as needed for indigestion or heartburn.     ampicillin 500 MG capsule  Commonly known as:  PRINCIPEN  Take 1 capsule (500 mg total) by mouth every 12 (twelve) hours. BID to complete a 10 day course     anastrozole 1 MG tablet  Commonly known as:  ARIMIDEX  Take 1 mg by mouth daily.     bisacodyl 10 MG suppository  Commonly known  as:  DULCOLAX  Place 1 suppository (10 mg total) rectally daily as needed for moderate constipation.     DSS 100 MG Caps  Take 100 mg by mouth 2 (two) times daily.     furosemide 20 MG tablet  Commonly known as:  LASIX  Take 20 mg by mouth every morning.     guaiFENesin 600 MG 12 hr tablet  Commonly known as:  MUCINEX  Take 1 tablet (600 mg total) by mouth 2 (two) times daily.     latanoprost 0.005 % ophthalmic solution  Commonly known as:  XALATAN  Place 1 drop into both eyes at bedtime.     lisinopril-hydrochlorothiazide 20-25 MG per tablet  Commonly known as:  PRINZIDE,ZESTORETIC  Take 1 tablet by mouth every morning.     methocarbamol 500 MG tablet  Commonly known as:  ROBAXIN  Take 1 tablet (500 mg total) by mouth every 6 (six) hours as needed for muscle spasms.     metoCLOPramide 5 MG tablet  Commonly known as:  REGLAN  Take 1-2 tablets (5-10 mg total) by mouth every 8 (eight) hours as needed for nausea (if ondansetron (ZOFRAN) ineffective.).     ondansetron 4 MG tablet  Commonly known as:  ZOFRAN  Take 1 tablet (4 mg total) by mouth every 6 (six) hours as needed for nausea.     oxybutynin 5 MG tablet  Commonly known as:  DITROPAN  Take 5 mg by mouth 3 (three) times daily as needed. To prevent urinary frequency     oxyCODONE 5 MG immediate release tablet  Commonly known as:  Oxy IR/ROXICODONE  Take 1-2 tablets (5-10 mg total) by mouth every 3 (three) hours as needed for breakthrough pain.     polyethylene glycol packet  Commonly known as:  MIRALAX / GLYCOLAX  Take 17 g by mouth daily as needed for mild constipation.     predniSONE 5 MG tablet  Commonly known as:  DELTASONE  Take 5 mg by mouth See admin instructions. Take 5mg  every 2-3 days     rivaroxaban 10 MG Tabs tablet  Commonly known as:  XARELTO  - Take 1 tablet (10 mg total) by mouth daily with breakfast. Take Xarelto for two and a half more weeks, then discontinue Xarelto.  - Once the patient has  completed the blood thinner regimen, then take a Baby 81 mg Aspirin daily for four more weeks.     traMADol 50 MG tablet  Commonly known as:  ULTRAM  Take 1 tablet (50 mg total) by mouth 3 (three) times daily as needed (mild pain). For pain       Follow-up Information   Follow up with Loanne Drilling, MD. Schedule an appointment as soon as possible for a visit on 03/06/2013. (Call [phone] tomorrow to make the appointment)    Specialty:  Orthopedic Surgery   Contact information:   [address] [place] [address] [phone]       Signed: Patrica Duel 02/22/2013, 8:03 AM

## 2013-02-26 ENCOUNTER — Encounter: Payer: Self-pay | Admitting: Internal Medicine

## 2013-02-26 ENCOUNTER — Non-Acute Institutional Stay (SKILLED_NURSING_FACILITY): Payer: Medicare Other | Admitting: Internal Medicine

## 2013-02-26 DIAGNOSIS — N390 Urinary tract infection, site not specified: Secondary | ICD-10-CM

## 2013-02-26 DIAGNOSIS — C50119 Malignant neoplasm of central portion of unspecified female breast: Secondary | ICD-10-CM

## 2013-02-26 DIAGNOSIS — D62 Acute posthemorrhagic anemia: Secondary | ICD-10-CM

## 2013-02-26 DIAGNOSIS — Z96651 Presence of right artificial knee joint: Secondary | ICD-10-CM

## 2013-02-26 DIAGNOSIS — H409 Unspecified glaucoma: Secondary | ICD-10-CM

## 2013-02-26 DIAGNOSIS — I10 Essential (primary) hypertension: Secondary | ICD-10-CM

## 2013-02-26 DIAGNOSIS — C50111 Malignant neoplasm of central portion of right female breast: Secondary | ICD-10-CM

## 2013-02-26 DIAGNOSIS — Z96659 Presence of unspecified artificial knee joint: Secondary | ICD-10-CM

## 2013-02-26 NOTE — Assessment & Plan Note (Signed)
Had hx of low BP in hospital but is good today;conrinus zestoretic 20/25 and Lasix 20 mg

## 2013-02-26 NOTE — Assessment & Plan Note (Signed)
In hospital-grew out enterococcus - on Ampicillen for total 10 days

## 2013-02-26 NOTE — Assessment & Plan Note (Signed)
Continue arimidex

## 2013-02-26 NOTE — Assessment & Plan Note (Addendum)
On 02/19/2013 on right; doing well;here for OT/PT; on xarelto

## 2013-02-26 NOTE — Assessment & Plan Note (Signed)
Last Hb 8.7;will recheck 12/1

## 2013-02-26 NOTE — Progress Notes (Signed)
MRN: [id number] Name: Natasha Fletcher  Sex: female Age: 79 y.o. DOB: 30-Dec-1925  PSC #: Ronni Rumble Facility/Room: 116 Level Of Care: SNF Provider: Merrilee Seashore D Emergency Contacts: Extended Emergency Contact Information Primary Emergency Contact: Murray,Peggy Address: [address]          [place], [place] [address] [place] Home Phone: [phone] Relation: Sister  Code Status: FULL  Allergies: Sulfa antibiotics  Chief Complaint  Patient presents with  . nursing home admission    HPI: Patient is 79 y.o. female who is admitted after total R knee arthroplasty.   Past Medical History  Diagnosis Date  . Hypertension   . Asthma   . Glaucoma   . Incontinence   . Wears dentures   . Wears glasses   . Thyroid disease     "something years ago"  . GERD (gastroesophageal reflux disease)   . Cancer     rt breast  . Edema leg   . Fast breathing     "because of pill"  . Pneumonia     h/o younger  . Arthritis     "all over"  . S/P total knee arthroplasty   . Glaucoma     Past Surgical History  Procedure Laterality Date  . Total hip arthroplasty Bilateral   . Shoulder surgery Left   . Appendectomy    . Carpal tunnel release Bilateral   . Tonsillectomy    . Colonoscopy    . Abdominal hysterectomy    . Eye surgery      both cataracts  . Breast biopsy  04/27/2011    Procedure: BREAST BIOPSY WITH NEEDLE LOCALIZATION;  Surgeon: Mariella Saa, MD;  Location: [facility];  Service: General;  Laterality: Right;  right needle localized breast lumpectomy   . Breast lumpectomy  04/27/11    right breast by [facility]  . Total knee arthroplasty Right 02/19/2013    Procedure: RIGHT TOTAL KNEE ARTHROPLASTY;  Surgeon: Loanne Drilling, MD;  Location: [facility];  Service: Orthopedics;  Laterality: Right;      Medication List       This list is accurate as of: 02/26/13  2:02 PM.  Always use your most recent med list.                acetaminophen 325 MG tablet  Commonly known as:  TYLENOL  Take 2 tablets (650 mg total) by mouth every 6 (six) hours as needed for mild pain (or Fever >/= 101).     alum & mag hydroxide-simeth 200-200-20 MG/5ML suspension  Commonly known as:  MAALOX/MYLANTA  Take 30 mLs by mouth as needed for indigestion or heartburn.     ampicillin 500 MG capsule  Commonly known as:  PRINCIPEN  Take 1 capsule (500 mg total) by mouth every 12 (twelve) hours. BID to complete a 10 day course     anastrozole 1 MG tablet  Commonly known as:  ARIMIDEX  Take 1 mg by mouth daily.     bisacodyl 10 MG suppository  Commonly known as:  DULCOLAX  Place 1 suppository (10 mg total) rectally daily as needed for moderate constipation.     DSS 100 MG Caps  Take 100 mg by mouth 2 (two) times daily.     furosemide 20 MG tablet  Commonly known as:  LASIX  Take 20 mg by mouth every morning.     guaiFENesin 600 MG 12 hr tablet  Commonly known as:  MUCINEX  Take  1 tablet (600 mg total) by mouth 2 (two) times daily.     latanoprost 0.005 % ophthalmic solution  Commonly known as:  XALATAN  Place 1 drop into both eyes at bedtime.     lisinopril-hydrochlorothiazide 20-25 MG per tablet  Commonly known as:  PRINZIDE,ZESTORETIC  Take 1 tablet by mouth every morning.     methocarbamol 500 MG tablet  Commonly known as:  ROBAXIN  Take 1 tablet (500 mg total) by mouth every 6 (six) hours as needed for muscle spasms.     metoCLOPramide 5 MG tablet  Commonly known as:  REGLAN  Take 1-2 tablets (5-10 mg total) by mouth every 8 (eight) hours as needed for nausea (if ondansetron (ZOFRAN) ineffective.).     ondansetron 4 MG tablet  Commonly known as:  ZOFRAN  Take 1 tablet (4 mg total) by mouth every 6 (six) hours as needed for nausea.     oxybutynin 5 MG tablet  Commonly known as:  DITROPAN  Take 5 mg by mouth 3 (three) times daily as needed. To prevent urinary frequency     oxyCODONE 5 MG immediate release tablet   Commonly known as:  Oxy IR/ROXICODONE  Take 1-2 tablets (5-10 mg total) by mouth every 3 (three) hours as needed for breakthrough pain.     polyethylene glycol packet  Commonly known as:  MIRALAX / GLYCOLAX  Take 17 g by mouth daily as needed for mild constipation.     predniSONE 5 MG tablet  Commonly known as:  DELTASONE  Take 5 mg by mouth See admin instructions. Take 5mg  every 2-3 days     rivaroxaban 10 MG Tabs tablet  Commonly known as:  XARELTO  - Take 1 tablet (10 mg total) by mouth daily with breakfast. Take Xarelto for two and a half more weeks, then discontinue Xarelto.  - Once the patient has completed the blood thinner regimen, then take a Baby 81 mg Aspirin daily for four more weeks.     traMADol 50 MG tablet  Commonly known as:  ULTRAM  Take 1 tablet (50 mg total) by mouth 3 (three) times daily as needed (mild pain). For pain        No orders of the defined types were placed in this encounter.     There is no immunization history on file for this patient.  History  Substance Use Topics  . Smoking status: Former Smoker -- 0.50 packs/day for 30 years    Types: Cigarettes    Quit date: 04/21/1975  . Smokeless tobacco: Never Used  . Alcohol Use: No    Family history is noncontributory    Review of Systems  DATA OBTAINED: from patient C/O NOT GETTING MUSCLE RELAXER AND NOT GETTING PAIN MEDICATION WHEN NEEDED GENERAL: Feels well no fevers, fatigue, appetite changes SKIN: No itching, rash or wounds EYES: No eye pain, redness, discharge EARS: No earache, tinnitus, change in hearing NOSE: No congestion, drainage or bleeding  MOUTH/THROAT: No mouth or tooth pain, No sore throat, No difficulty chewing or swallowing  RESPIRATORY: No cough, wheezing, SOB CARDIAC: No chest pain, palpitations, lower extremity edema  GI: No abdominal pain, No N/V/D or constipation, No heartburn or reflux  GU: No dysuria, frequency or urgency, or incontinence  MUSCULOSKELETAL: No  unrelieved bone/joint pain NEUROLOGIC: No headache, dizziness or focal weakness PSYCHIATRIC: No overt anxiety or sadness. Sleeps well. No behavior issue.   Filed Vitals:   02/26/13 1341  BP: 140/70  Pulse: 72  Temp:  99.1 F (37.3 C)  Resp: 20    Physical Exam  GENERAL APPEARANCE: Alert, conversant. Appropriately groomed. No acute distress.  SKIN: No diaphoresis rash, or wounds HEAD: Normocephalic, atraumatic  EYES: Conjunctiva/lids clear. Pupils round, reactive. EOMs intact.  EARS: External exam WNL, canals clear. Hearing grossly normal.  NOSE: No deformity or discharge.  MOUTH/THROAT: Lips w/o lesions.  RESPIRATORY: Breathing is even, unlabored. Lung sounds are clear   CARDIOVASCULAR: Heart RRR no murmurs, rubs or gallops. No peripheral edema.  GASTROINTESTINAL: Abdomen is soft, non-tender, not distended w/ normal bowel sounds GENITOURINARY: Bladder non tender, not distended  MUSCULOSKELETAL: r LEG IN LONG KNEE IMMO BILIZER NEUROLOGIC: Oriented X3. Cranial nerves 2-12 grossly intact. Moves all extremities no tremor. PSYCHIATRIC: Mood and affect appropriate to situation, no behavioral issues  Patient Active Problem List   Diagnosis Date Noted  . S/P total knee arthroplasty   . Glaucoma   . Hypokalemia 02/22/2013  . UTI (urinary tract infection) 02/21/2013  . Postoperative anemia due to acute blood loss 02/20/2013  . OA (osteoarthritis) of knee 02/19/2013  . Knee pain, acute 09/29/2012  . Hypertension   . Arthritis   . Thyroid disease   . Cancer of central portion of female breast 04/14/2011    CBC    Component Value Date/Time   WBC 10.7* 02/22/2013 0418   WBC 6.5 06/30/2012 1408   RBC 3.26* 02/22/2013 0418   RBC 4.31 06/30/2012 1408   HGB 8.7* 02/22/2013 0418   HGB 11.6 06/30/2012 1408   HCT 27.3* 02/22/2013 0418   HCT 36.6 06/30/2012 1408   PLT 230 02/22/2013 0418   PLT 207 06/30/2012 1408   MCV 83.7 02/22/2013 0418   MCV 85.0 06/30/2012 1408   LYMPHSABS 1.8  09/29/2012 1640   LYMPHSABS 1.9 06/30/2012 1408   MONOABS 0.3 09/29/2012 1640   MONOABS 0.3 06/30/2012 1408   EOSABS 0.2 09/29/2012 1640   EOSABS 0.1 06/30/2012 1408   BASOSABS 0.0 09/29/2012 1640   BASOSABS 0.0 06/30/2012 1408    CMP     Component Value Date/Time   NA 137 02/21/2013 0412   NA 143 06/30/2012 1408   K 3.3* 02/21/2013 0412   K 4.1 06/30/2012 1408   CL 101 02/21/2013 0412   CL 106 06/30/2012 1408   CO2 26 02/21/2013 0412   CO2 28 06/30/2012 1408   GLUCOSE 112* 02/21/2013 0412   GLUCOSE 103* 06/30/2012 1408   BUN 16 02/21/2013 0412   BUN 17.5 06/30/2012 1408   CREATININE 0.94 02/21/2013 0412   CREATININE 1.2* 06/30/2012 1408   CALCIUM 9.1 02/21/2013 0412   CALCIUM 10.1 06/30/2012 1408   PROT 7.2 02/15/2013 1150   PROT 6.9 06/30/2012 1408   ALBUMIN 3.5 02/15/2013 1150   ALBUMIN 3.5 06/30/2012 1408   AST 20 02/15/2013 1150   AST 20 06/30/2012 1408   ALT 14 02/15/2013 1150   ALT 13 06/30/2012 1408   ALKPHOS 106 02/15/2013 1150   ALKPHOS 86 06/30/2012 1408   BILITOT 0.8 02/15/2013 1150   BILITOT 0.67 06/30/2012 1408   GFRNONAA 54* 02/21/2013 0412   GFRAA 62* 02/21/2013 0412    Assessment and Plan  S/P total knee arthroplasty On 02/19/2013 on right; doing well;here for OT/PT; on xarelto  Postoperative anemia due to acute blood loss Last Hb 8.7;will recheck 12/1  Hypertension Had hx of low BP in hospital but is good today;conrinus zestoretic 20/25 and Lasix 20 mg  UTI (urinary tract infection) In hospital-grew out enterococcus - on Ampicillen  for total 10 days  Cancer of central portion of female breast Continue arimidex  Glaucoma Continue Xalatan drops    Margit Hanks, MD

## 2013-02-26 NOTE — Assessment & Plan Note (Signed)
Continue Xalatan drops

## 2013-03-12 ENCOUNTER — Telehealth (INDEPENDENT_AMBULATORY_CARE_PROVIDER_SITE_OTHER): Payer: Self-pay

## 2013-03-12 NOTE — Telephone Encounter (Signed)
Called and left message for patient regarding upcoming appointment on 03/22/13.  Appointment time need's to be rescheduled to 10:45 am on 03/22/13 w/Dr. Johna Sheriff.  If patient cannot come in at that time we will need to reschedule to another day due to surgery add on.

## 2013-03-20 ENCOUNTER — Encounter: Payer: Self-pay | Admitting: Nurse Practitioner

## 2013-03-20 ENCOUNTER — Other Ambulatory Visit: Payer: Self-pay | Admitting: *Deleted

## 2013-03-20 ENCOUNTER — Non-Acute Institutional Stay (SKILLED_NURSING_FACILITY): Payer: Medicare Other | Admitting: Nurse Practitioner

## 2013-03-20 DIAGNOSIS — Z96659 Presence of unspecified artificial knee joint: Secondary | ICD-10-CM

## 2013-03-20 DIAGNOSIS — D62 Acute posthemorrhagic anemia: Secondary | ICD-10-CM

## 2013-03-20 DIAGNOSIS — Z96651 Presence of right artificial knee joint: Secondary | ICD-10-CM

## 2013-03-20 DIAGNOSIS — I10 Essential (primary) hypertension: Secondary | ICD-10-CM

## 2013-03-20 DIAGNOSIS — K5900 Constipation, unspecified: Secondary | ICD-10-CM

## 2013-03-20 DIAGNOSIS — C50119 Malignant neoplasm of central portion of unspecified female breast: Secondary | ICD-10-CM

## 2013-03-20 DIAGNOSIS — K219 Gastro-esophageal reflux disease without esophagitis: Secondary | ICD-10-CM

## 2013-03-20 DIAGNOSIS — C50111 Malignant neoplasm of central portion of right female breast: Secondary | ICD-10-CM

## 2013-03-20 DIAGNOSIS — G4700 Insomnia, unspecified: Secondary | ICD-10-CM | POA: Insufficient documentation

## 2013-03-20 MED ORDER — OXYCODONE HCL 5 MG PO TABS: ORAL_TABLET | ORAL | Status: DC

## 2013-03-20 NOTE — Progress Notes (Signed)
Patient ID: Natasha Fletcher, female   DOB: 11/13/1925, 79 y.o.   MRN: [id number]    Nursing Home Location:  [facility]   Place of Service: SNF (31)  PCP: Lorenda Peck, MD  Allergies  Allergen Reactions  . Sulfa Antibiotics Other (See Comments)    Reaction many years ago-unknown    Chief Complaint  Patient presents with  . Medical Managment of Chronic Issues    HPI:  Patient is 79 y.o. Female with PMH of glaucoma, HTN, Asthma, incontinence, breast Ca, GERD, constipation, OA who is admitted to [facility] after total R knee arthroplasty. Pt is being seen for her monthly follow up.   Review of Systems:  Review of Systems  Constitutional: Negative for fever, chills, weight loss and malaise/fatigue.  Eyes: Negative.   Respiratory: Negative for cough and shortness of breath.   Cardiovascular: Negative for chest pain, palpitations and leg swelling.  Gastrointestinal: Positive for heartburn, nausea and constipation. Negative for abdominal pain and blood in stool.  Genitourinary: Negative for dysuria.  Musculoskeletal: Positive for joint pain (knee pain).  Skin: Negative.   Neurological: Negative for dizziness, weakness and headaches.  Psychiatric/Behavioral: The patient has insomnia.      Past Medical History  Diagnosis Date  . Hypertension   . Asthma   . Glaucoma   . Incontinence   . Wears dentures   . Wears glasses   . Thyroid disease     "something years ago"  . GERD (gastroesophageal reflux disease)   . Cancer     rt breast  . Edema leg   . Fast breathing     "because of pill"  . Pneumonia     h/o younger  . Arthritis     "all over"  . S/P total knee arthroplasty   . Glaucoma    Past Surgical History  Procedure Laterality Date  . Total hip arthroplasty Bilateral   . Shoulder surgery Left   . Appendectomy    . Carpal tunnel release Bilateral   . Tonsillectomy    . Colonoscopy    . Abdominal hysterectomy    . Eye surgery      both  cataracts  . Breast biopsy  04/27/2011    Procedure: BREAST BIOPSY WITH NEEDLE LOCALIZATION;  Surgeon: Mariella Saa, MD;  Location: [facility];  Service: General;  Laterality: Right;  right needle localized breast lumpectomy   . Breast lumpectomy  04/27/11    right breast by [facility]  . Total knee arthroplasty Right 02/19/2013    Procedure: RIGHT TOTAL KNEE ARTHROPLASTY;  Surgeon: Loanne Drilling, MD;  Location: [facility];  Service: Orthopedics;  Laterality: Right;   Social History:   reports that she quit smoking about 37 years ago. Her smoking use included Cigarettes. She has a 15 pack-year smoking history. She has never used smokeless tobacco. She reports that she does not drink alcohol or use illicit drugs.  Family History  Problem Relation Age of Onset  . Heart disease Mother   . Stroke Father     Medications: Patient's Medications  New Prescriptions   No medications on file  Previous Medications   ACETAMINOPHEN (TYLENOL) 325 MG TABLET    Take 2 tablets (650 mg total) by mouth every 6 (six) hours as needed for mild pain (or Fever >/= 101).   ALUM & MAG HYDROXIDE-SIMETH (MAALOX/MYLANTA) 200-200-20 MG/5ML SUSPENSION    Take 30 mLs by mouth as needed for indigestion or heartburn.  ANASTROZOLE (ARIMIDEX) 1 MG TABLET    Take 1 mg by mouth daily.   ASPIRIN 81 MG TABLET    Take 81 mg by mouth daily.   BISACODYL (DULCOLAX) 10 MG SUPPOSITORY    Place 1 suppository (10 mg total) rectally daily as needed for moderate constipation.   DOCUSATE SODIUM 100 MG CAPS    Take 100 mg by mouth 2 (two) times daily.   FUROSEMIDE (LASIX) 20 MG TABLET    Take 20 mg by mouth every morning.   GUAIFENESIN (MUCINEX) 600 MG 12 HR TABLET    Take 1 tablet (600 mg total) by mouth 2 (two) times daily.   LATANOPROST (XALATAN) 0.005 % OPHTHALMIC SOLUTION    Place 1 drop into both eyes at bedtime.   LISINOPRIL-HYDROCHLOROTHIAZIDE (PRINZIDE,ZESTORETIC) 20-25 MG PER TABLET    Take 1 tablet by mouth  every morning.    METHOCARBAMOL (ROBAXIN) 500 MG TABLET    Take 1 tablet (500 mg total) by mouth every 6 (six) hours as needed for muscle spasms.   METOCLOPRAMIDE (REGLAN) 5 MG TABLET    Take 1-2 tablets (5-10 mg total) by mouth every 8 (eight) hours as needed for nausea (if ondansetron (ZOFRAN) ineffective.).   ONDANSETRON (ZOFRAN) 4 MG TABLET    Take 1 tablet (4 mg total) by mouth every 6 (six) hours as needed for nausea.   OXYBUTYNIN (DITROPAN) 5 MG TABLET    Take 5 mg by mouth 3 (three) times daily as needed. To prevent urinary frequency   OXYCODONE (OXY IR/ROXICODONE) 5 MG IMMEDIATE RELEASE TABLET    Take one tablet by mouth every 3 hours as needed for severe breakthrough pain   POLYETHYLENE GLYCOL (MIRALAX / GLYCOLAX) PACKET    Take 17 g by mouth daily as needed for mild constipation.   PREDNISONE (DELTASONE) 5 MG TABLET    Take 5 mg by mouth See admin instructions. Take 5mg  tablet every 3 days   TRAMADOL (ULTRAM) 50 MG TABLET    Take 1 tablet (50 mg total) by mouth 3 (three) times daily as needed (mild pain). For pain  Modified Medications   No medications on file  Discontinued Medications   AMPICILLIN (PRINCIPEN) 500 MG CAPSULE    Take 1 capsule (500 mg total) by mouth every 12 (twelve) hours. BID to complete a 10 day course   RIVAROXABAN (XARELTO) 10 MG TABS TABLET    Take 1 tablet (10 mg total) by mouth daily with breakfast. Take Xarelto for two and a half more weeks, then discontinue Xarelto. Once the patient has completed the blood thinner regimen, then take a Baby 81 mg Aspirin daily for four more weeks.     Physical Exam:  Filed Vitals:   03/20/13 1130  BP: 110/70  Pulse: 90  Temp: 98.6 F (37 C)  Resp: 20  Weight: 155 lb (70.308 kg)   Physical Exam  Constitutional: She is oriented to person, place, and time and well-developed, well-nourished, and in no distress.  HENT:  Head: Normocephalic and atraumatic.  Right Ear: External ear normal.  Left Ear: External ear  normal.  Nose: Nose normal.  Mouth/Throat: Oropharynx is clear and moist. No oropharyngeal exudate.  Neck: Normal range of motion. Neck supple.  Cardiovascular: Normal rate, regular rhythm and normal heart sounds.   Pulmonary/Chest: Effort normal and breath sounds normal. No respiratory distress.  Abdominal: Soft. Bowel sounds are normal. She exhibits no distension. There is no tenderness.  Musculoskeletal: She exhibits tenderness (to knee). She exhibits no  edema.  Lymphadenopathy:    She has no cervical adenopathy.  Neurological: She is alert and oriented to person, place, and time.  Skin: Skin is warm and dry. No rash noted. No erythema.  Psychiatric: Affect normal.      Labs reviewed: Basic Metabolic Panel:  Recent Labs  16/10/96 1150 02/20/13 0408 02/21/13 0412  NA 134* 138 137  K 3.6 4.1 3.3*  CL 97 105 101  CO2 26 25 26   GLUCOSE 112* 151* 112*  BUN 12 15 16   CREATININE 1.01 0.95 0.94  CALCIUM 10.3 9.0 9.1   Liver Function Tests:  Recent Labs  06/30/12 1408 09/29/12 1640 02/15/13 1150  AST 20 19 20   ALT 13 9 14   ALKPHOS 86 92 106  BILITOT 0.67 0.6 0.8  PROT 6.9 7.2 7.2  ALBUMIN 3.5 3.8 3.5   No results found for this basename: LIPASE, AMYLASE,  in the last 8760 hours No results found for this basename: AMMONIA,  in the last 8760 hours CBC:  Recent Labs  09/29/12 1640  02/20/13 0408 02/21/13 0412 02/22/13 0418  WBC 5.8  < > 9.5 13.3* 10.7*  NEUTROABS 3.6  --   --   --   --   HGB 11.1*  < > 9.6* 9.3* 8.7*  HCT 34.7*  < > 29.4* 29.3* 27.3*  MCV 82.6  < > 84.7 85.2 83.7  PLT 200  < > 234 282 230  < > = values in this interval not displayed. Cardiac Enzymes: No results found for this basename: CKTOTAL, CKMB, CKMBINDEX, TROPONINI,  in the last 8760 hours BNP: No components found with this basename: POCBNP,  CBG: No results found for this basename: GLUCAP,  in the last 8760 hours TSH:  Recent Labs  09/29/12 2000  TSH 2.413     Assessment/Plan 1. Hypertension -Patient is stable; continue current regimen. Will monitor and make changes as necessary.  2. S/P total knee arthroplasty, right -conts to progress with therapy; pain is improving  3. Postoperative anemia due to acute blood loss -will follow up CBC   4. Cancer of central portion of female breast, right -conts on arimedex and following with oncology  5. Insomnia -will add melatonin 3 mg qhs  6. Unspecified constipation -will change colace to senna s BID -to increase water intake  7. GERD (gastroesophageal reflux disease) -pt complains of worsening GERD -will start omeprazole 20 mg PO daily

## 2013-03-22 ENCOUNTER — Encounter (INDEPENDENT_AMBULATORY_CARE_PROVIDER_SITE_OTHER): Payer: Self-pay | Admitting: General Surgery

## 2013-03-22 ENCOUNTER — Ambulatory Visit (INDEPENDENT_AMBULATORY_CARE_PROVIDER_SITE_OTHER): Payer: BC Managed Care – HMO | Admitting: General Surgery

## 2013-03-22 VITALS — BP 128/68 | HR 70 | Temp 98.0°F | Resp 18 | Ht 66.0 in | Wt 147.0 lb

## 2013-03-22 DIAGNOSIS — Z853 Personal history of malignant neoplasm of breast: Secondary | ICD-10-CM

## 2013-03-22 NOTE — Progress Notes (Signed)
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 She denies any side effects from her hormonal therapy.. She denies any complaints specific to her breasts such as lump or nipple discharge or skin changes. She had right knee replacement and is currently undergoing rehabilitation at a nursing facility but making good progress.  Exam: BP 128/68  Pulse 70  Temp(Src) 98 F (36.7 C)  Resp 18  Ht 5\' 6"  (1.676 m)  Wt 147 lb (66.679 kg)  BMI 23.74 kg/m2  Gen.: Elderly but healthy-appearing African American female in no distress Lymph nodes no cervical, supraclavicular, or axillary nodes palpable Breasts: Well-healed circumareolar incision on the right. There are no palpable masses in either breast particular attention to the lumpectomy site on the right.  Imaging: mammogram is just over one year out and is scheduled  Assessment and plan: doing well with no evidence of recurrent or new breast cancer. Return one year.

## 2013-04-02 ENCOUNTER — Encounter: Payer: Self-pay | Admitting: Internal Medicine

## 2013-04-02 ENCOUNTER — Non-Acute Institutional Stay (SKILLED_NURSING_FACILITY): Payer: Medicare Other | Admitting: Internal Medicine

## 2013-04-02 DIAGNOSIS — K219 Gastro-esophageal reflux disease without esophagitis: Secondary | ICD-10-CM

## 2013-04-02 DIAGNOSIS — C50111 Malignant neoplasm of central portion of right female breast: Secondary | ICD-10-CM

## 2013-04-02 DIAGNOSIS — Z96659 Presence of unspecified artificial knee joint: Secondary | ICD-10-CM

## 2013-04-02 DIAGNOSIS — C50119 Malignant neoplasm of central portion of unspecified female breast: Secondary | ICD-10-CM

## 2013-04-02 DIAGNOSIS — D62 Acute posthemorrhagic anemia: Secondary | ICD-10-CM

## 2013-04-02 DIAGNOSIS — I10 Essential (primary) hypertension: Secondary | ICD-10-CM

## 2013-04-02 DIAGNOSIS — M1990 Unspecified osteoarthritis, unspecified site: Secondary | ICD-10-CM

## 2013-04-02 DIAGNOSIS — M129 Arthropathy, unspecified: Secondary | ICD-10-CM

## 2013-04-02 DIAGNOSIS — H409 Unspecified glaucoma: Secondary | ICD-10-CM

## 2013-04-02 DIAGNOSIS — Z96651 Presence of right artificial knee joint: Secondary | ICD-10-CM

## 2013-04-02 NOTE — Progress Notes (Signed)
MRN: [id number] Name: Natasha Fletcher  Sex: female Age: 79 y.o. DOB: Mar 06, 1926  PSC #: Son[facility] Dandy Facility/Room: 116 Level Of Care: SNF Provider: Merrilee Seashore D Emergency Contacts: Extended Emergency Contact Information Primary Emergency Contact: Murray,Peggy Address: [address]          [place], [place] [address] [place] Home Phone: [phone] Relation: Sister  Code Status: FULL  Allergies: Sulfa antibiotics  Chief Complaint  Patient presents with  . Discharge Note    HPI: Patient is 79 y.o. female who had knee replacement in November and is now ready to be d/c to home.  Past Medical History  Diagnosis Date  . Hypertension   . Asthma   . Glaucoma   . Incontinence   . Wears dentures   . Wears glasses   . Thyroid disease     "something years ago"  . GERD (gastroesophageal reflux disease)   . Cancer     rt breast  . Edema leg   . Fast breathing     "because of pill"  . Pneumonia     h/o younger  . Arthritis     "all over"  . S/P total knee arthroplasty   . Glaucoma     Past Surgical History  Procedure Laterality Date  . Total hip arthroplasty Bilateral   . Shoulder surgery Left   . Appendectomy    . Carpal tunnel release Bilateral   . Tonsillectomy    . Colonoscopy    . Abdominal hysterectomy    . Eye surgery      both cataracts  . Breast biopsy  04/27/2011    Procedure: BREAST BIOPSY WITH NEEDLE LOCALIZATION;  Surgeon: Mariella Saa, MD;  Location: [facility];  Service: General;  Laterality: Right;  right needle localized breast lumpectomy   . Breast lumpectomy  04/27/11    right breast by [facility]  . Total knee arthroplasty Right 02/19/2013    Procedure: RIGHT TOTAL KNEE ARTHROPLASTY;  Surgeon: Loanne Drilling, MD;  Location: [facility];  Service: Orthopedics;  Laterality: Right;      Medication List       This list is accurate as of: 04/02/13  3:02 PM.  Always use your most recent med list.                anastrozole 1 MG tablet  Commonly known as:  ARIMIDEX  Take 1 mg by mouth daily.     furosemide 20 MG tablet  Commonly known as:  LASIX  Take 20 mg by mouth every morning.     latanoprost 0.005 % ophthalmic solution  Commonly known as:  XALATAN  Place 1 drop into both eyes at bedtime.     lisinopril-hydrochlorothiazide 20-25 MG per tablet  Commonly known as:  PRINZIDE,ZESTORETIC  Take 1 tablet by mouth every morning.     oxybutynin 5 MG tablet  Commonly known as:  DITROPAN  Take 5 mg by mouth 3 (three) times daily as needed. To prevent urinary frequency     traMADol 50 MG tablet  Commonly known as:  ULTRAM  Take 1 tablet (50 mg total) by mouth 3 (three) times daily as needed (mild pain). For pain        No orders of the defined types were placed in this encounter.    Immunization History  Administered Date(s) Administered  . Influenza-Unspecified 01/03/2013  . Pneumococcal-Unspecified 04/05/2012    History  Substance Use Topics  . Smoking status: Former  Smoker -- 0.50 packs/day for 30 years    Types: Cigarettes    Quit date: 04/21/1975  . Smokeless tobacco: Never Used  . Alcohol Use: No    Filed Vitals:   04/02/13 1453  BP: 136/74  Pulse: 86  Temp: 97 F (36.1 C)  Resp: 20    Physical Exam  GENERAL APPEARANCE: Alert, conversant. Appropriately groomed. No acute distress.  HEENT: Unremarkable. RESPIRATORY: Breathing is even, unlabored. Lung sounds are clear   CARDIOVASCULAR: Heart RRR no murmurs, rubs or gallops. No peripheral edema.  GASTROINTESTINAL: Abdomen is soft, non-tender, not distended w/ normal bowel sounds.  NEUROLOGIC: Cranial nerves 2-12 grossly intact. Moves all extremities no tremor.  Patient Active Problem List   Diagnosis Date Noted  . Insomnia 03/20/2013  . Unspecified constipation 03/20/2013  . GERD (gastroesophageal reflux disease) 03/20/2013  . S/P total knee arthroplasty   . Glaucoma   . Hypokalemia 02/22/2013  . UTI  (urinary tract infection) 02/21/2013  . Postoperative anemia due to acute blood loss 02/20/2013  . OA (osteoarthritis) of knee 02/19/2013  . Knee pain, acute 09/29/2012  . Hypertension   . Arthritis   . Thyroid disease   . Cancer of central portion of female breast 04/14/2011    CBC    Component Value Date/Time   WBC 10.7* 02/22/2013 0418   WBC 6.5 06/30/2012 1408   RBC 3.26* 02/22/2013 0418   RBC 4.31 06/30/2012 1408   HGB 8.7* 02/22/2013 0418   HGB 11.6 06/30/2012 1408   HCT 27.3* 02/22/2013 0418   HCT 36.6 06/30/2012 1408   PLT 230 02/22/2013 0418   PLT 207 06/30/2012 1408   MCV 83.7 02/22/2013 0418   MCV 85.0 06/30/2012 1408   LYMPHSABS 1.8 09/29/2012 1640   LYMPHSABS 1.9 06/30/2012 1408   MONOABS 0.3 09/29/2012 1640   MONOABS 0.3 06/30/2012 1408   EOSABS 0.2 09/29/2012 1640   EOSABS 0.1 06/30/2012 1408   BASOSABS 0.0 09/29/2012 1640   BASOSABS 0.0 06/30/2012 1408    CMP     Component Value Date/Time   NA 137 02/21/2013 0412   NA 143 06/30/2012 1408   K 3.3* 02/21/2013 0412   K 4.1 06/30/2012 1408   CL 101 02/21/2013 0412   CL 106 06/30/2012 1408   CO2 26 02/21/2013 0412   CO2 28 06/30/2012 1408   GLUCOSE 112* 02/21/2013 0412   GLUCOSE 103* 06/30/2012 1408   BUN 16 02/21/2013 0412   BUN 17.5 06/30/2012 1408   CREATININE 0.94 02/21/2013 0412   CREATININE 1.2* 06/30/2012 1408   CALCIUM 9.1 02/21/2013 0412   CALCIUM 10.1 06/30/2012 1408   PROT 7.2 02/15/2013 1150   PROT 6.9 06/30/2012 1408   ALBUMIN 3.5 02/15/2013 1150   ALBUMIN 3.5 06/30/2012 1408   AST 20 02/15/2013 1150   AST 20 06/30/2012 1408   ALT 14 02/15/2013 1150   ALT 13 06/30/2012 1408   ALKPHOS 106 02/15/2013 1150   ALKPHOS 86 06/30/2012 1408   BILITOT 0.8 02/15/2013 1150   BILITOT 0.67 06/30/2012 1408   GFRNONAA 54* 02/21/2013 0412   GFRAA 62* 02/21/2013 0412    Assessment and Plan  Patient is being discharged to home in improved and stable condition with HH, OT/PT. She will do well.  Margit Hanks,  MD

## 2013-05-03 ENCOUNTER — Ambulatory Visit
Admission: RE | Admit: 2013-05-03 | Discharge: 2013-05-03 | Disposition: A | Discharge status: Home / Self Care | Payer: Medicare Other | Source: Ambulatory Visit | Attending: Oncology | Admitting: Oncology

## 2013-05-03 DIAGNOSIS — Z853 Personal history of malignant neoplasm of breast: Secondary | ICD-10-CM

## 2013-06-28 ENCOUNTER — Other Ambulatory Visit: Payer: Medicare Other

## 2013-06-28 ENCOUNTER — Telehealth: Payer: Self-pay | Admitting: Oncology

## 2013-06-28 ENCOUNTER — Ambulatory Visit: Payer: Medicare Other | Admitting: Oncology

## 2013-06-28 NOTE — Telephone Encounter (Signed)
, °

## 2013-07-10 ENCOUNTER — Other Ambulatory Visit: Payer: Self-pay

## 2013-07-10 DIAGNOSIS — C50119 Malignant neoplasm of central portion of unspecified female breast: Secondary | ICD-10-CM

## 2013-07-11 ENCOUNTER — Encounter (HOSPITAL_COMMUNITY): Payer: Self-pay | Admitting: Emergency Medicine

## 2013-07-11 ENCOUNTER — Emergency Department (HOSPITAL_COMMUNITY)
Admission: EM | Admit: 2013-07-11 | Discharge: 2013-07-11 | Disposition: A | Discharge status: Home / Self Care | Payer: Medicare Other | Attending: Emergency Medicine | Admitting: Emergency Medicine

## 2013-07-11 ENCOUNTER — Emergency Department (HOSPITAL_COMMUNITY): Payer: Medicare Other

## 2013-07-11 ENCOUNTER — Other Ambulatory Visit: Payer: Medicare Other

## 2013-07-11 ENCOUNTER — Ambulatory Visit: Payer: Self-pay | Admitting: Adult Health

## 2013-07-11 DIAGNOSIS — Z8639 Personal history of other endocrine, nutritional and metabolic disease: Secondary | ICD-10-CM | POA: Insufficient documentation

## 2013-07-11 DIAGNOSIS — F29 Unspecified psychosis not due to a substance or known physiological condition: Secondary | ICD-10-CM | POA: Insufficient documentation

## 2013-07-11 DIAGNOSIS — R51 Headache: Secondary | ICD-10-CM | POA: Insufficient documentation

## 2013-07-11 DIAGNOSIS — Z862 Personal history of diseases of the blood and blood-forming organs and certain disorders involving the immune mechanism: Secondary | ICD-10-CM | POA: Insufficient documentation

## 2013-07-11 DIAGNOSIS — R209 Unspecified disturbances of skin sensation: Secondary | ICD-10-CM | POA: Insufficient documentation

## 2013-07-11 DIAGNOSIS — Z87891 Personal history of nicotine dependence: Secondary | ICD-10-CM | POA: Insufficient documentation

## 2013-07-11 DIAGNOSIS — IMO0002 Reserved for concepts with insufficient information to code with codable children: Secondary | ICD-10-CM | POA: Insufficient documentation

## 2013-07-11 DIAGNOSIS — Z8701 Personal history of pneumonia (recurrent): Secondary | ICD-10-CM | POA: Insufficient documentation

## 2013-07-11 DIAGNOSIS — Z853 Personal history of malignant neoplasm of breast: Secondary | ICD-10-CM | POA: Insufficient documentation

## 2013-07-11 DIAGNOSIS — H409 Unspecified glaucoma: Secondary | ICD-10-CM | POA: Insufficient documentation

## 2013-07-11 DIAGNOSIS — N390 Urinary tract infection, site not specified: Secondary | ICD-10-CM | POA: Insufficient documentation

## 2013-07-11 DIAGNOSIS — I10 Essential (primary) hypertension: Secondary | ICD-10-CM | POA: Insufficient documentation

## 2013-07-11 DIAGNOSIS — Z79899 Other long term (current) drug therapy: Secondary | ICD-10-CM | POA: Insufficient documentation

## 2013-07-11 DIAGNOSIS — R410 Disorientation, unspecified: Secondary | ICD-10-CM

## 2013-07-11 DIAGNOSIS — J45909 Unspecified asthma, uncomplicated: Secondary | ICD-10-CM | POA: Insufficient documentation

## 2013-07-11 DIAGNOSIS — K219 Gastro-esophageal reflux disease without esophagitis: Secondary | ICD-10-CM | POA: Insufficient documentation

## 2013-07-11 DIAGNOSIS — M129 Arthropathy, unspecified: Secondary | ICD-10-CM | POA: Insufficient documentation

## 2013-07-11 LAB — URINALYSIS, ROUTINE W REFLEX MICROSCOPIC
Bilirubin Urine: NEGATIVE
GLUCOSE, UA: NEGATIVE mg/dL
Ketones, ur: NEGATIVE mg/dL
Nitrite: NEGATIVE
PH: 7 (ref 5.0–8.0)
PROTEIN: 30 mg/dL — AB
SPECIFIC GRAVITY, URINE: 1.012 (ref 1.005–1.030)
Urobilinogen, UA: 0.2 mg/dL (ref 0.0–1.0)

## 2013-07-11 LAB — COMPREHENSIVE METABOLIC PANEL
ALT: 9 U/L (ref 0–35)
AST: 18 U/L (ref 0–37)
Albumin: 3.9 g/dL (ref 3.5–5.2)
Alkaline Phosphatase: 103 U/L (ref 39–117)
BUN: 10 mg/dL (ref 6–23)
CALCIUM: 10.5 mg/dL (ref 8.4–10.5)
CO2: 27 mEq/L (ref 19–32)
CREATININE: 0.77 mg/dL (ref 0.50–1.10)
Chloride: 101 mEq/L (ref 96–112)
GFR calc Af Amer: 86 mL/min — ABNORMAL LOW (ref >90)
GFR calc non Af Amer: 74 mL/min — ABNORMAL LOW (ref >90)
Glucose, Bld: 92 mg/dL (ref 70–99)
Potassium: 3.8 mEq/L (ref 3.7–5.3)
Sodium: 140 mEq/L (ref 137–147)
TOTAL PROTEIN: 7.4 g/dL (ref 6.0–8.3)
Total Bilirubin: 0.7 mg/dL (ref 0.3–1.2)

## 2013-07-11 LAB — CBC WITH DIFFERENTIAL/PLATELET
Basophils Absolute: 0 10*3/uL (ref 0.0–0.1)
Basophils Relative: 0 % (ref 0–1)
EOS ABS: 0.1 10*3/uL (ref 0.0–0.7)
Eosinophils Relative: 3 % (ref 0–5)
HEMATOCRIT: 35.4 % — AB (ref 36.0–46.0)
Hemoglobin: 11.4 g/dL — ABNORMAL LOW (ref 12.0–15.0)
Lymphocytes Relative: 34 % (ref 12–46)
Lymphs Abs: 1.5 10*3/uL (ref 0.7–4.0)
MCH: 26 pg (ref 26.0–34.0)
MCHC: 32.2 g/dL (ref 30.0–36.0)
MCV: 80.8 fL (ref 78.0–100.0)
MONO ABS: 0.3 10*3/uL (ref 0.1–1.0)
MONOS PCT: 6 % (ref 3–12)
Neutro Abs: 2.6 10*3/uL (ref 1.7–7.7)
Neutrophils Relative %: 57 % (ref 43–77)
Platelets: 226 10*3/uL (ref 150–400)
RBC: 4.38 MIL/uL (ref 3.87–5.11)
RDW: 14.9 % (ref 11.5–15.5)
WBC: 4.5 10*3/uL (ref 4.0–10.5)

## 2013-07-11 LAB — URINE MICROSCOPIC-ADD ON

## 2013-07-11 MED ORDER — METOCLOPRAMIDE HCL 5 MG/ML IJ SOLN
5.0000 mg | Freq: Once | INTRAMUSCULAR | Status: AC
  Administered 2013-07-11: 5 mg via INTRAVENOUS
  Filled 2013-07-11: qty 2

## 2013-07-11 MED ORDER — DIPHENHYDRAMINE HCL 50 MG/ML IJ SOLN
12.5000 mg | Freq: Once | INTRAMUSCULAR | Status: AC
  Administered 2013-07-11: 12.5 mg via INTRAVENOUS
  Filled 2013-07-11: qty 1

## 2013-07-11 MED ORDER — CEPHALEXIN 500 MG PO CAPS
500.0000 mg | ORAL_CAPSULE | Freq: Once | ORAL | Status: AC
  Administered 2013-07-11: 500 mg via ORAL
  Filled 2013-07-11: qty 1

## 2013-07-11 MED ORDER — CEPHALEXIN 500 MG PO CAPS: 500.0000 mg | ORAL_CAPSULE | Freq: Four times a day (QID) | ORAL | Status: DC

## 2013-07-11 NOTE — ED Notes (Signed)
Patient transported to CT 

## 2013-07-11 NOTE — ED Notes (Signed)
Ambulated to bathroom with walker and 1 person assist

## 2013-07-11 NOTE — ED Provider Notes (Signed)
CSN: [id number]     Arrival date & time 07/11/13  1236 History   First MD Initiated Contact with Patient 07/11/13 1304     Chief Complaint  Patient presents with  . AMS    Level 5 Caveat for confusion  (Consider location/radiation/quality/duration/timing/severity/associated sxs/prior Treatment) HPI Patient reports she started getting a headache today behind her left eye. She states it started gradually possibly around 10 this morning. She also states she has some right leg numbness but her son states she had a knee replacement and that's been a chronic complaint. Her knee replacement with several months ago. The patient describes the headache is constant without throbbing. She denies nausea, vomiting, or blurred vision. She states she sometimes has trouble thinking what she wants to say. Family reports she has been having some confusion for an undetermined time. They state it is getting worse. She saw her PCP today and was told to come to the ED for evaluation.    PCP Dr Su Hilt  Past Medical History  Diagnosis Date  . Hypertension   . Asthma   . Glaucoma   . Incontinence   . Wears dentures   . Wears glasses   . Thyroid disease     "something years ago"  . GERD (gastroesophageal reflux disease)   . Cancer     rt breast  . Edema leg   . Fast breathing     "because of pill"  . Pneumonia     h/o younger  . Arthritis     "all over"  . S/P total knee arthroplasty   . Glaucoma    Past Surgical History  Procedure Laterality Date  . Total hip arthroplasty Bilateral   . Shoulder surgery Left   . Appendectomy    . Carpal tunnel release Bilateral   . Tonsillectomy    . Colonoscopy    . Abdominal hysterectomy    . Eye surgery      both cataracts  . Breast biopsy  04/27/2011    Procedure: BREAST BIOPSY WITH NEEDLE LOCALIZATION;  Surgeon: Mariella Saa, MD;  Location: [facility];  Service: General;  Laterality: Right;  right needle localized breast lumpectomy    . Breast lumpectomy  04/27/11    right breast by [facility]  . Total knee arthroplasty Right 02/19/2013    Procedure: RIGHT TOTAL KNEE ARTHROPLASTY;  Surgeon: Loanne Drilling, MD;  Location: [facility];  Service: Orthopedics;  Laterality: Right;   Family History  Problem Relation Age of Onset  . Heart disease Mother   . Stroke Father    History  Substance Use Topics  . Smoking status: Former Smoker -- 0.50 packs/day for 30 years    Types: Cigarettes    Quit date: 04/21/1975  . Smokeless tobacco: Never Used  . Alcohol Use: No   Lives at home Lives with daughter Uses a walker  OB History   Grav Para Term Preterm Abortions TAB SAB Ect Mult Living                 Review of Systems  Unable to perform ROS: Age      Allergies  Sulfa antibiotics  Home Medications   Current Outpatient Rx  Name  Route  Sig  Dispense  Refill  . Calcium Carbonate-Vitamin D (CALCIUM-VITAMIN D) 500-200 MG-UNIT per tablet   Oral   Take 1 tablet by mouth daily.         . flurazepam (DALMANE) 15 MG capsule  Oral   Take 15 mg by mouth at bedtime as needed for sleep.         . furosemide (LASIX) 20 MG tablet   Oral   Take 20 mg by mouth every morning.         . latanoprost (XALATAN) 0.005 % ophthalmic solution   Both Eyes   Place 1 drop into both eyes at bedtime.         Marland Kitchen lisinopril-hydrochlorothiazide (PRINZIDE,ZESTORETIC) 20-25 MG per tablet   Oral   Take 1 tablet by mouth every morning.          . Melaton-Thean-Cham-PassF-LBalm (MELATONIN + L-THEANINE PO)   Oral   Take 1 capsule by mouth daily.         Marland Kitchen oxybutynin (DITROPAN) 5 MG tablet   Oral   Take 5 mg by mouth 3 (three) times daily as needed. To prevent urinary frequency         . predniSONE (DELTASONE) 5 MG tablet   Oral   Take 5 mg by mouth daily with breakfast.         . traMADol (ULTRAM) 50 MG tablet   Oral   Take 50-100 mg by mouth every 6 (six) hours as needed for moderate pain.          . Vitamin  D, Cholecalciferol, 1000 UNITS TABS   Oral   Take 1 tablet by mouth.          BP 166/103  Pulse 84  Temp(Src) 97.6 F (36.4 C) (Oral)  Resp 18  SpO2 98%  Vital signs normal except hypertension    Physical Exam  Nursing note and vitals reviewed. Constitutional: She is oriented to person, place, and time. She appears well-developed and well-nourished.  Non-toxic appearance. She does not appear ill. No distress.  HENT:  Head: Normocephalic and atraumatic.  Right Ear: External ear normal.  Left Ear: External ear normal.  Nose: Nose normal. No mucosal edema or rhinorrhea.  Mouth/Throat: Oropharynx is clear and moist and mucous membranes are normal. No dental abscesses or uvula swelling.  Eyes: Conjunctivae and EOM are normal. Pupils are equal, round, and reactive to light.  Neck: Normal range of motion and full passive range of motion without pain. Neck supple.  Cardiovascular: Normal rate, regular rhythm and normal heart sounds.  Exam reveals no gallop and no friction rub.   No murmur heard. Pulmonary/Chest: Effort normal and breath sounds normal. No respiratory distress. She has no wheezes. She has no rhonchi. She has no rales. She exhibits no tenderness and no crepitus.  Abdominal: Soft. Normal appearance and bowel sounds are normal. She exhibits no distension. There is no tenderness. There is no rebound and no guarding.  Musculoskeletal: Normal range of motion. She exhibits no edema and no tenderness.  Moves all extremities well.   Neurological: She is alert and oriented to person, place, and time. She has normal strength. No cranial nerve deficit.  Grips are equal, no pronator drift, she is able to lift her legs against gravity. Patient however is unable to cooperate for cranial nerves because she does not seem to understand the instructions. Sometimes when patient answers questions with inappropriate words.  Skin: Skin is warm, dry and intact. No rash noted. No erythema. No  pallor.  Psychiatric: She has a normal mood and affect. Her speech is normal and behavior is normal. Her mood appears not anxious.    ED Course  Procedures (including critical care time)  Medications  metoCLOPramide (  REGLAN) injection 5 mg (5 mg Intravenous Given 07/11/13 1331)  diphenhydrAMINE (BENADRYL) injection 12.5 mg (12.5 mg Intravenous Given 07/11/13 1331)  cephALEXin (KEFLEX) capsule 500 mg (500 mg Oral Given 07/11/13 1518)   Recheck at 1400 patient states her headache is gone. Discussed getting MRI of the brain to make sure she did not have a small stroke not seen on CT scan and she is agreeable.  15:00 Patient was started on Keflex for possible UTI  16:15 pt left at change of shift with Dr Leonides Schanz to get MR results.   Labs Review Results for orders placed during the hospital encounter of 07/11/13  CBC WITH DIFFERENTIAL      Result Value Ref Range   WBC 4.5  4.0 - 10.5 K/uL   RBC 4.38  3.87 - 5.11 MIL/uL   Hemoglobin 11.4 (*) 12.0 - 15.0 g/dL   HCT 35.4 (*) 36.0 - 46.0 %   MCV 80.8  78.0 - 100.0 fL   MCH 26.0  26.0 - 34.0 pg   MCHC 32.2  30.0 - 36.0 g/dL   RDW 14.9  11.5 - 15.5 %   Platelets 226  150 - 400 K/uL   Neutrophils Relative % 57  43 - 77 %   Neutro Abs 2.6  1.7 - 7.7 K/uL   Lymphocytes Relative 34  12 - 46 %   Lymphs Abs 1.5  0.7 - 4.0 K/uL   Monocytes Relative 6  3 - 12 %   Monocytes Absolute 0.3  0.1 - 1.0 K/uL   Eosinophils Relative 3  0 - 5 %   Eosinophils Absolute 0.1  0.0 - 0.7 K/uL   Basophils Relative 0  0 - 1 %   Basophils Absolute 0.0  0.0 - 0.1 K/uL  COMPREHENSIVE METABOLIC PANEL      Result Value Ref Range   Sodium 140  137 - 147 mEq/L   Potassium 3.8  3.7 - 5.3 mEq/L   Chloride 101  96 - 112 mEq/L   CO2 27  19 - 32 mEq/L   Glucose, Bld 92  70 - 99 mg/dL   BUN 10  6 - 23 mg/dL   Creatinine, Ser 0.77  0.50 - 1.10 mg/dL   Calcium 10.5  8.4 - 10.5 mg/dL   Total Protein 7.4  6.0 - 8.3 g/dL   Albumin 3.9  3.5 - 5.2 g/dL   AST 18  0 - 37 U/L    ALT 9  0 - 35 U/L   Alkaline Phosphatase 103  39 - 117 U/L   Total Bilirubin 0.7  0.3 - 1.2 mg/dL   GFR calc non Af Amer 74 (*) >90 mL/min   GFR calc Af Amer 86 (*) >90 mL/min  URINALYSIS, ROUTINE W REFLEX MICROSCOPIC      Result Value Ref Range   Color, Urine YELLOW  YELLOW   APPearance CLOUDY (*) CLEAR   Specific Gravity, Urine 1.012  1.005 - 1.030   pH 7.0  5.0 - 8.0   Glucose, UA NEGATIVE  NEGATIVE mg/dL   Hgb urine dipstick SMALL (*) NEGATIVE   Bilirubin Urine NEGATIVE  NEGATIVE   Ketones, ur NEGATIVE  NEGATIVE mg/dL   Protein, ur 30 (*) NEGATIVE mg/dL   Urobilinogen, UA 0.2  0.0 - 1.0 mg/dL   Nitrite NEGATIVE  NEGATIVE   Leukocytes, UA MODERATE (*) NEGATIVE  URINE MICROSCOPIC-ADD ON      Result Value Ref Range   WBC, UA 11-20  <3 WBC/hpf  RBC / HPF 7-10  <3 RBC/hpf   Bacteria, UA RARE  RARE   Urine-Other MUCOUS PRESENT     Laboratory interpretation all normal except mild anemia, possible UTI    Imaging Review Ct Head Wo Contrast  07/11/2013   CLINICAL DATA:  Headache.  History of breast cancer.  EXAM: CT HEAD WITHOUT CONTRAST  TECHNIQUE: Contiguous axial images were obtained from the base of the skull through the vertex without intravenous contrast.  COMPARISON:  None.  FINDINGS: Extensive hypoattenuation in the subcortical and periventricular deep white matter is likely due to chronic microvascular ischemic change. No evidence of acute intracranial abnormality including infarct, hemorrhage, mass lesion, mass effect, midline shift or abnormal extra-axial fluid collection is identified. The calvarium is intact. The left sphenoid sinus is opacified.  IMPRESSION: No acute abnormality.  Extensive chronic microvascular ischemic change.  Complete opacification of the left sphenoid sinus.   Electronically Signed   By: Inge Rise M.D.   On: 07/11/2013 14:04     EKG Interpretation None      MDM  patient presents with family with worsening confusion. She seems to have some  difficulty speaking today. This seems to be new. Patient is being evaluated for possible stroke.    Final diagnoses:  Confusion  UTI (urinary tract infection)    Disposition pending  Rolland Porter, MD, Abram Sander     Janice Norrie, MD 07/11/13 [phone]

## 2013-07-11 NOTE — ED Notes (Signed)
Patient transported to MRI 

## 2013-07-11 NOTE — ED Notes (Signed)
Per pt, states she does not know why she is hear, cant get her words out, unable to answer questions-sent here from PCP-patient holding left side of head, states having a headache

## 2013-07-11 NOTE — Discharge Instructions (Signed)
Urinary Tract Infection  Urinary tract infections (UTIs) can develop anywhere along your urinary tract. Your urinary tract is your body's drainage system for removing wastes and extra water. Your urinary tract includes two kidneys, two ureters, a bladder, and a urethra. Your kidneys are a pair of bean-shaped organs. Each kidney is about the size of your fist. They are located below your ribs, one on each side of your spine.  CAUSES  Infections are caused by microbes, which are microscopic organisms, including fungi, viruses, and bacteria. These organisms are so small that they can only be seen through a microscope. Bacteria are the microbes that most commonly cause UTIs.  SYMPTOMS   Symptoms of UTIs may vary by age and gender of the patient and by the location of the infection. Symptoms in young women typically include a frequent and intense urge to urinate and a painful, burning feeling in the bladder or urethra during urination. Older women and men are more likely to be tired, shaky, and weak and have muscle aches and abdominal pain. A fever may mean the infection is in your kidneys. Other symptoms of a kidney infection include pain in your back or sides below the ribs, nausea, and vomiting.  DIAGNOSIS  To diagnose a UTI, your caregiver will ask you about your symptoms. Your caregiver also will ask to provide a urine sample. The urine sample will be tested for bacteria and white blood cells. White blood cells are made by your body to help fight infection.  TREATMENT   Typically, UTIs can be treated with medication. Because most UTIs are caused by a bacterial infection, they usually can be treated with the use of antibiotics. The choice of antibiotic and length of treatment depend on your symptoms and the type of bacteria causing your infection.  HOME CARE INSTRUCTIONS   If you were prescribed antibiotics, take them exactly as your caregiver instructs you. Finish the medication even if you feel better after you  have only taken some of the medication.   Drink enough water and fluids to keep your urine clear or pale yellow.   Avoid caffeine, tea, and carbonated beverages. They tend to irritate your bladder.   Empty your bladder often. Avoid holding urine for long periods of time.   Empty your bladder before and after sexual intercourse.   After a bowel movement, women should cleanse from front to back. Use each tissue only once.  SEEK MEDICAL CARE IF:    You have back pain.   You develop a fever.   Your symptoms do not begin to resolve within 3 days.  SEEK IMMEDIATE MEDICAL CARE IF:    You have severe back pain or lower abdominal pain.   You develop chills.   You have nausea or vomiting.   You have continued burning or discomfort with urination.  MAKE SURE YOU:    Understand these instructions.   Will watch your condition.   Will get help right away if you are not doing well or get worse.  Document Released: 12/30/2004 Document Revised: 09/21/2011 Document Reviewed: 04/30/2011  ExitCare Patient Information 2014 [employer]

## 2013-07-11 NOTE — ED Provider Notes (Signed)
4:00 PM  Assumed care from Dr. Rolland Porter.  Patient is an 80 year old female who comes from her who ambulates with a walker at baseline and lives with her sister who has a history of hypertension, asthma who presents with altered mental status for several days. Her neuro exam is nonfocal but she would intermittently answer questions with inappropriate words. Head CT is negative. Patient does have urinary tract infection which may be the cause of her symptoms. MRI of her brain is pending. If MRI is negative, plan is to discharge patient home. Patient and family are comfortable with this plan.  7:00 PM  Pt is still neurologically intact. She is hemodynamically stable. Her MRI shows no acute abnormality. Patient and family are comfortable with plan for discharge home. We'll discharge with Keflex. Have discussed supportive care instructions and return precautions. They verbalize understanding and are comfortable plan.  [place], DO 07/11/13 1902

## 2013-07-11 NOTE — ED Notes (Signed)
Per sister states memory issues going on for about 6-8 months, getting worse-PCP noticed a big change and sent her here-

## 2013-07-12 LAB — URINE CULTURE
Colony Count: NO GROWTH
Culture: NO GROWTH

## 2013-09-04 ENCOUNTER — Other Ambulatory Visit: Payer: Self-pay | Admitting: Oncology

## 2013-09-04 ENCOUNTER — Telehealth: Payer: Self-pay | Admitting: *Deleted

## 2013-09-04 MED ORDER — ANASTROZOLE 1 MG PO TABS: 1.0000 mg | ORAL_TABLET | Freq: Every day | ORAL | Status: DC

## 2013-09-04 NOTE — Telephone Encounter (Signed)
Pt left message today requesting a call back from nurse about a medication refill.  Called pt back at given phone number unsuccessfully.  Phone just ringing and unable to leave message on voice mail. Pt's phone   [phone].

## 2013-09-04 NOTE — Telephone Encounter (Signed)
Patient needs refill on Anastrozole. She was due in March/April of this year and missed this appt due to being in the hospital. Informed patient of Dr Humphrey Rolls not being in our practice and she is requesting a female replacement. Informed patient we would call her back with appt with Dr Marko Plume or Dr Humphrey Rolls in next couple days.

## 2013-09-11 ENCOUNTER — Telehealth: Payer: Self-pay | Admitting: *Deleted

## 2013-09-11 NOTE — Telephone Encounter (Signed)
Called pt and confirmed 10/03/13 appt w/ her.  Mailed calendar to pt.

## 2013-09-26 ENCOUNTER — Telehealth: Payer: Self-pay | Admitting: Adult Health

## 2013-09-26 NOTE — Telephone Encounter (Signed)
CALLED PT TO INFORM APPT 7/1 HAS BEEN MOVED PER LC (MOVE APPRX 1 MOS TO MAKE ROOM FOR CHEMO PTS). PT STATES SHE CANT MOVE APPT 1 MOS DUE TO KNEE SURGERY ON 7/13 AND SAYS SHE  HAS CONCERNS RE: WEIGHT LOSS SHE NEEDS TO DISCUSS WITH MD ASAP. ALSO SHE WANTS TO BE ESTABLISHED WITH A FEMALE MD. GAVE APPT FOR 7/6 WITH AJ AND ADVISED WE CAN GET HER EST WITH DR. LL (PER HER PREV CONVERSATION WITH AMY M.) AFTER SHE RECOVERS FROM SURGERY.

## 2013-09-27 ENCOUNTER — Other Ambulatory Visit: Payer: Self-pay | Admitting: Orthopedic Surgery

## 2013-09-28 ENCOUNTER — Encounter (HOSPITAL_COMMUNITY): Payer: Self-pay | Admitting: Pharmacy Technician

## 2013-10-01 NOTE — Patient Instructions (Addendum)
[place]  10/02/2013   Your procedure is scheduled on:  7-13 -2015  Enter through [facility] Entrance and follow signs to [facility]. Arrive at   0730     AM .  Call this number if you have problems the morning of surgery: [phone]  Or Presurgical Testing [phone]([place]) For Living Will and/or Health Care Power Attorney Forms: please provide copy for your medical record,may bring AM of surgery(Forms should be already notarized -we do not provide this service).( 10-02-13 Yes-has Living will, HCPOA forms: prefers not to provide at this time.).     Do not eat food:After Midnight.    .  Take these medicines the morning of surgery with A SIP OF WATER: Anastrozole. Prednisone.Tramadol.     Use/bring eye drops. Do not  Take any Aspirin, ibuprofen, Aleve or over the counter Vitamin supplements 10 days prior to surgery. Take usual prescribed  Meds, other days.    Do not wear jewelry, make-up or nail polish.  Do not wear lotions, powders, or perfumes. You may wear deodorant.  Do not shave 48 hours(2 days) prior to first CHG shower(legs and under arms).(Shaving face and neck okay.)  Do not bring valuables to the hospital.(Hospital is not responsible for lost valuables).  Contacts, dentures or removable bridgework, body piercing, hair pins may not be worn into surgery.  Leave suitcase in the car. After surgery it may be brought to your room.  For patients admitted to the hospital, checkout time is 11:00 AM the day of discharge.(Restricted visitors-Any Persons displaying flu-like symptoms or illness).    Patients discharged the day of surgery will not be allowed to drive home. Must have responsible person with you x 24 hours once discharged.  Name and phone number of your driver: Davis Gourd [phone] home.  Special Instructions: CHG(Chlorhedine 4%-"Hibiclens","Betasept","Aplicare") Shower Use Special Wash: see special instructions.(avoid face and  genitals)   Please read over the following fact sheets that you were given: MRSA Information, Blood Transfusion fact sheet, Incentive Spirometry Instruction.  Remember : Type/Screen "Blue armbands" - may not be removed once applied(would result in being retested AM of surgery, if removed).  Failure to follow these instructions may result in Cancellation of your surgery.   ________________________    [facility] - Preparing for Surgery Before surgery, you can play an important role.  Because skin is not sterile, your skin needs to be as free of germs as possible.  You can reduce the number of germs on your skin by washing with CHG (chlorahexidine gluconate) soap before surgery.  CHG is an antiseptic cleaner which kills germs and bonds with the skin to continue killing germs even after washing. Please DO NOT use if you have an allergy to CHG or antibacterial soaps.  If your skin becomes reddened/irritated stop using the CHG and inform your nurse when you arrive at Short Stay. Do not shave (including legs and underarms) for at least 48 hours prior to the first CHG shower.  You may shave your face/neck. Please follow these instructions carefully:  1.  Shower with CHG Soap the night before surgery and the  morning of Surgery.  2.  If you choose to wash your hair, wash your hair first as usual with your  normal  shampoo.  3.  After you shampoo, rinse your hair and body thoroughly to remove the  shampoo.  4.  Use CHG as you would any other liquid soap.  You can apply chg directly  to the skin and wash                       Gently with a scrungie or clean washcloth.  5.  Apply the CHG Soap to your body ONLY FROM THE NECK DOWN.   Do not use on face/ open                           Wound or open sores. Avoid contact with eyes, ears mouth and genitals (private parts).                       Wash face,  Genitals (private parts) with your normal soap.             6.  Wash thoroughly,  paying special attention to the area where your surgery  will be performed.  7.  Thoroughly rinse your body with warm water from the neck down.  8.  DO NOT shower/wash with your normal soap after using and rinsing off  the CHG Soap.                9.  Pat yourself dry with a clean towel.            10.  Wear clean pajamas.            11.  Place clean sheets on your bed the night of your first shower and do not  sleep with pets. Day of Surgery : Do not apply any lotions/deodorants the morning of surgery.  Please wear clean clothes to the hospital/surgery center.  FAILURE TO FOLLOW THESE INSTRUCTIONS MAY RESULT IN THE CANCELLATION OF YOUR SURGERY PATIENT SIGNATURE_________________________________  NURSE SIGNATURE__________________________________  ________________________________________________________________________   [name]  An incentive spirometer is a tool that can help keep your lungs clear and active. This tool measures how well you are filling your lungs with each breath. Taking long deep breaths may help reverse or decrease the chance of developing breathing (pulmonary) problems (especially infection) following:  A long period of time when you are unable to move or be active. BEFORE THE PROCEDURE   If the spirometer includes an indicator to show your best effort, your nurse or respiratory therapist will set it to a desired goal.  If possible, sit up straight or lean slightly forward. Try not to slouch.  Hold the incentive spirometer in an upright position. INSTRUCTIONS FOR USE  1. Sit on the edge of your bed if possible, or sit up as far as you can in bed or on a chair. 2. Hold the incentive spirometer in an upright position. 3. Breathe out normally. 4. Place the mouthpiece in your mouth and seal your lips tightly around it. 5. Breathe in slowly and as deeply as possible, raising the piston or the ball toward the top of the column. 6. Hold your breath for 3-5  seconds or for as long as possible. Allow the piston or ball to fall to the bottom of the column. 7. Remove the mouthpiece from your mouth and breathe out normally. 8. Rest for a few seconds and repeat Steps 1 through 7 at least 10 times every 1-2 hours when you are awake. Take your time and take a few normal breaths between deep breaths. 9. The spirometer may include an indicator to  show your best effort. Use the indicator as a goal to work toward during each repetition. 10. After each set of 10 deep breaths, practice coughing to be sure your lungs are clear. If you have an incision (the cut made at the time of surgery), support your incision when coughing by placing a pillow or rolled up towels firmly against it. Once you are able to get out of bed, walk around indoors and cough well. You may stop using the incentive spirometer when instructed by your caregiver.  RISKS AND COMPLICATIONS  Take your time so you do not get dizzy or light-headed.  If you are in pain, you may need to take or ask for pain medication before doing incentive spirometry. It is harder to take a deep breath if you are having pain. AFTER USE  Rest and breathe slowly and easily.  It can be helpful to keep track of a log of your progress. Your caregiver can provide you with a simple table to help with this. If you are using the spirometer at home, follow these instructions: [place] IF:   You are having difficultly using the spirometer.  You have trouble using the spirometer as often as instructed.  Your pain medication is not giving enough relief while using the spirometer.  You develop fever of 100.5 F (38.1 C) or higher. SEEK IMMEDIATE MEDICAL CARE IF:   You cough up bloody sputum that had not been present before.  You develop fever of 102 F (38.9 C) or greater.  You develop worsening pain at or near the incision site. MAKE SURE YOU:   Understand these instructions.  Will watch your  condition.  Will get help right away if you are not doing well or get worse. Document Released: 08/02/2006 Document Revised: 06/14/2011 Document Reviewed: 10/03/2006 ExitCare Patient Information 2014 [employer], [place].   ________________________________________________________________________  WHAT IS A BLOOD TRANSFUSION? Blood Transfusion Information  A transfusion is the replacement of blood or some of its parts. Blood is made up of multiple cells which provide different functions.  Red blood cells carry oxygen and are used for blood loss replacement.  White blood cells fight against infection.  Platelets control bleeding.  Plasma helps clot blood.  Other blood products are available for specialized needs, such as hemophilia or other clotting disorders. BEFORE THE TRANSFUSION  Who gives blood for transfusions?   Healthy volunteers who are fully evaluated to make sure their blood is safe. This is blood bank blood. Transfusion therapy is the safest it has ever been in the practice of medicine. Before blood is taken from a donor, a complete history is taken to make sure that person has no history of diseases nor engages in risky social behavior (examples are intravenous drug use or sexual activity with multiple partners). The donor's travel history is screened to minimize risk of transmitting infections, such as malaria. The donated blood is tested for signs of infectious diseases, such as HIV and hepatitis. The blood is then tested to be sure it is compatible with you in order to minimize the chance of a transfusion reaction. If you or a relative donates blood, this is often done in anticipation of surgery and is not appropriate for emergency situations. It takes many days to process the donated blood. RISKS AND COMPLICATIONS Although transfusion therapy is very safe and saves many lives, the main dangers of transfusion include:   Getting an infectious disease.  Developing a transfusion  reaction. This is an allergic reaction  to something in the blood you were given. Every precaution is taken to prevent this. The decision to have a blood transfusion has been considered carefully by your caregiver before blood is given. Blood is not given unless the benefits outweigh the risks. AFTER THE TRANSFUSION  Right after receiving a blood transfusion, you will usually feel much better and more energetic. This is especially true if your red blood cells have gotten low (anemic). The transfusion raises the level of the red blood cells which carry oxygen, and this usually causes an energy increase.  The nurse administering the transfusion will monitor you carefully for complications. HOME CARE INSTRUCTIONS  No special instructions are needed after a transfusion. You may find your energy is better. Speak with your caregiver about any limitations on activity for underlying diseases you may have. SEEK MEDICAL CARE IF:   Your condition is not improving after your transfusion.  You develop redness or irritation at the intravenous (IV) site. SEEK IMMEDIATE MEDICAL CARE IF:  Any of the following symptoms occur over the next 12 hours:  Shaking chills.  You have a temperature by mouth above 102 F (38.9 C), not controlled by medicine.  Chest, back, or muscle pain.  People around you feel you are not acting correctly or are confused.  Shortness of breath or difficulty breathing.  Dizziness and fainting.  You get a rash or develop hives.  You have a decrease in urine output.  Your urine turns a dark color or changes to pink, red, or brown. Any of the following symptoms occur over the next 10 days:  You have a temperature by mouth above 102 F (38.9 C), not controlled by medicine.  Shortness of breath.  Weakness after normal activity.  The white part of the eye turns yellow (jaundice).  You have a decrease in the amount of urine or are urinating less often.  Your urine turns a  dark color or changes to pink, red, or brown. Document Released: 03/19/2000 Document Revised: 06/14/2011 Document Reviewed: 11/06/2007 [facility] Patient Information 2014 [place], [place].  _______________________________________________________________________

## 2013-10-02 ENCOUNTER — Encounter (HOSPITAL_COMMUNITY)
Admission: RE | Admit: 2013-10-02 | Discharge: 2013-10-02 | Disposition: A | Discharge status: Home / Self Care | Payer: Medicare Other | Source: Ambulatory Visit | Attending: Orthopedic Surgery | Admitting: Orthopedic Surgery

## 2013-10-02 ENCOUNTER — Encounter (HOSPITAL_COMMUNITY): Payer: Self-pay

## 2013-10-02 ENCOUNTER — Encounter (INDEPENDENT_AMBULATORY_CARE_PROVIDER_SITE_OTHER): Payer: Self-pay

## 2013-10-02 DIAGNOSIS — Z01812 Encounter for preprocedural laboratory examination: Secondary | ICD-10-CM | POA: Diagnosis present

## 2013-10-02 HISTORY — DX: Edema, unspecified: R60.9

## 2013-10-02 LAB — COMPREHENSIVE METABOLIC PANEL
ALT: 13 U/L (ref 0–35)
AST: 20 U/L (ref 0–37)
Albumin: 4 g/dL (ref 3.5–5.2)
Alkaline Phosphatase: 95 U/L (ref 39–117)
BILIRUBIN TOTAL: 0.8 mg/dL (ref 0.3–1.2)
BUN: 11 mg/dL (ref 6–23)
CHLORIDE: 102 meq/L (ref 96–112)
CO2: 27 meq/L (ref 19–32)
Calcium: 9.9 mg/dL (ref 8.4–10.5)
Creatinine, Ser: 0.74 mg/dL (ref 0.50–1.10)
GFR, EST AFRICAN AMERICAN: 87 mL/min — AB (ref >90)
GFR, EST NON AFRICAN AMERICAN: 75 mL/min — AB (ref >90)
Glucose, Bld: 91 mg/dL (ref 70–99)
Potassium: 3.6 mEq/L — ABNORMAL LOW (ref 3.7–5.3)
Sodium: 142 mEq/L (ref 137–147)
Total Protein: 6.9 g/dL (ref 6.0–8.3)

## 2013-10-02 LAB — SURGICAL PCR SCREEN
MRSA, PCR: NEGATIVE
Staphylococcus aureus: NEGATIVE

## 2013-10-02 LAB — APTT: aPTT: 33 seconds (ref 24–37)

## 2013-10-02 LAB — CBC
HCT: 36.4 % (ref 36.0–46.0)
Hemoglobin: 11.6 g/dL — ABNORMAL LOW (ref 12.0–15.0)
MCH: 26.2 pg (ref 26.0–34.0)
MCHC: 31.9 g/dL (ref 30.0–36.0)
MCV: 82.4 fL (ref 78.0–100.0)
Platelets: 244 10*3/uL (ref 150–400)
RBC: 4.42 MIL/uL (ref 3.87–5.11)
RDW: 14.6 % (ref 11.5–15.5)
WBC: 5.1 10*3/uL (ref 4.0–10.5)

## 2013-10-02 LAB — URINALYSIS, ROUTINE W REFLEX MICROSCOPIC
BILIRUBIN URINE: NEGATIVE
Glucose, UA: NEGATIVE mg/dL
KETONES UR: NEGATIVE mg/dL
NITRITE: NEGATIVE
PH: 7.5 (ref 5.0–8.0)
PROTEIN: 30 mg/dL — AB
Specific Gravity, Urine: 1.011 (ref 1.005–1.030)
UROBILINOGEN UA: 0.2 mg/dL (ref 0.0–1.0)

## 2013-10-02 LAB — PROTIME-INR
INR: 1.03 (ref 0.00–1.49)
PROTHROMBIN TIME: 13.5 s (ref 11.6–15.2)

## 2013-10-02 LAB — URINE MICROSCOPIC-ADD ON

## 2013-10-02 NOTE — Pre-Procedure Instructions (Signed)
10-02-13 [place] in [place]- note urinalysis.

## 2013-10-02 NOTE — Pre-Procedure Instructions (Deleted)
[place]  10/02/2013   Your procedure is scheduled on:   10-15-2013  Enter through [facility] Entrance and follow signs to [place]. Arrive at      0730  AM .  Call this number if you have problems the morning of surgery: [phone]  Or Presurgical Testing [phone](Natasha Fletcher) For Living Will and/or Health Care Power Attorney Forms: please provide copy for your medical record,may bring AM of surgery(Forms should be already notarized -we do not provide this service).(Yes/ No information preferred today).  Remember: Follow any bowel prep instructions per MD office. For Cpap use: Bring mask and tubing only.   Do not eat food:After Midnight.  May have clear liquids:up to 6 Hours before arrival. Nothing after :  Clear liquids include soda, tea, black coffee, apple or grape juice, broth.  Take these medicines the morning of surgery with A SIP OF WATER: Arimidex.Prednisone.Ultram. Eye drops   Do not wear jewelry, make-up or nail polish.  Do not wear lotions, powders, or perfumes. You may wear deodorant.  Do not shave 48 hours(2 days) prior to first CHG shower(legs and under arms).(Shaving face and neck okay.)  Do not bring valuables to the hospital.(Hospital is not responsible for lost valuables).  Contacts, dentures or removable bridgework, body piercing, hair pins may not be worn into surgery.  Leave suitcase in the car. After surgery it may be brought to your room.  For patients admitted to the hospital, checkout time is 11:00 AM the day of discharge.(Restricted visitors-Any Persons displaying flu-like symptoms or illness).    Patients discharged the day of surgery will not be allowed to drive home. Must have responsible person with you x 24 hours once discharged.  Name and phone number of your driver:   Special Instructions: CHG(Chlorhedine 4%-"Hibiclens","Betasept","Aplicare") Shower Use Special Wash: see special instructions.(avoid face and genitals)   Please  read over the following fact sheets that you were given: MRSA Information, Blood Transfusion fact sheet, Incentive Spirometry Instruction.  Remember : Type/Screen "Blue armbands" - may not be removed once applied(would result in being retested AM of surgery, if removed).  Failure to follow these instructions may result in Cancellation of your surgery.   Patient signature_______________________________________________________    [facility] - Preparing for Surgery Before surgery, you can play an important role.  Because skin is not sterile, your skin needs to be as free of germs as possible.  You can reduce the number of germs on your skin by washing with CHG (chlorahexidine gluconate) soap before surgery.  CHG is an antiseptic cleaner which kills germs and bonds with the skin to continue killing germs even after washing. Please DO NOT use if you have an allergy to CHG or antibacterial soaps.  If your skin becomes reddened/irritated stop using the CHG and inform your nurse when you arrive at Short Stay. Do not shave (including legs and underarms) for at least 48 hours prior to the first CHG shower.  You may shave your face/neck. Please follow these instructions carefully:  1.  Shower with CHG Soap the night before surgery and the  morning of Surgery.  2.  If you choose to wash your hair, wash your hair first as usual with your  normal  shampoo.  3.  After you shampoo, rinse your hair and body thoroughly to remove the  shampoo.  4.  Use CHG as you would any other liquid soap.  You can apply chg directly  to the skin and wash                       Gently with a scrungie or clean washcloth.  5.  Apply the CHG Soap to your body ONLY FROM THE NECK DOWN.   Do not use on face/ open                           Wound or open sores. Avoid contact with eyes, ears mouth and genitals (private parts).                       Wash face,  Genitals (private parts) with your normal soap.              6.  Wash thoroughly, paying special attention to the area where your surgery  will be performed.  7.  Thoroughly rinse your body with warm water from the neck down.  8.  DO NOT shower/wash with your normal soap after using and rinsing off  the CHG Soap.                9.  Pat yourself dry with a clean towel.            10.  Wear clean pajamas.            11.  Place clean sheets on your bed the night of your first shower and do not  sleep with pets. Day of Surgery : Do not apply any lotions/deodorants the morning of surgery.  Please wear clean clothes to the hospital/surgery center.  FAILURE TO FOLLOW THESE INSTRUCTIONS MAY RESULT IN THE CANCELLATION OF YOUR SURGERY PATIENT SIGNATURE_________________________________  NURSE SIGNATURE__________________________________  ________________________________________________________________________   Natasha Fletcher  An incentive spirometer is a tool that can help keep your lungs clear and active. This tool measures how well you are filling your lungs with each breath. Taking long deep breaths may help reverse or decrease the chance of developing breathing (pulmonary) problems (especially infection) following:  A long period of time when you are unable to move or be active. BEFORE THE PROCEDURE   If the spirometer includes an indicator to show your best effort, your nurse or respiratory therapist will set it to a desired goal.  If possible, sit up straight or lean slightly forward. Try not to slouch.  Hold the incentive spirometer in an upright position. INSTRUCTIONS FOR USE  1. Sit on the edge of your bed if possible, or sit up as far as you can in bed or on a chair. 2. Hold the incentive spirometer in an upright position. 3. Breathe out normally. 4. Place the mouthpiece in your mouth and seal your lips tightly around it. 5. Breathe in slowly and as deeply as possible, raising the piston or the ball toward the top of the  column. 6. Hold your breath for 3-5 seconds or for as long as possible. Allow the piston or ball to fall to the bottom of the column. 7. Remove the mouthpiece from your mouth and breathe out normally. 8. Rest for a few seconds and repeat Steps 1 through 7 at least 10 times every 1-2 hours when you are awake. Take your time and take a few normal breaths between deep breaths. 9. The spirometer may include an indicator to  show your best effort. Use the indicator as a goal to work toward during each repetition. 10. After each set of 10 deep breaths, practice coughing to be sure your lungs are clear. If you have an incision (the cut made at the time of surgery), support your incision when coughing by placing a pillow or rolled up towels firmly against it. Once you are able to get out of bed, walk around indoors and cough well. You may stop using the incentive spirometer when instructed by your caregiver.  RISKS AND COMPLICATIONS  Take your time so you do not get dizzy or light-headed.  If you are in pain, you may need to take or ask for pain medication before doing incentive spirometry. It is harder to take a deep breath if you are having pain. AFTER USE  Rest and breathe slowly and easily.  It can be helpful to keep track of a log of your progress. Your caregiver can provide you with a simple table to help with this. If you are using the spirometer at home, follow these instructions: [place] IF:   You are having difficultly using the spirometer.  You have trouble using the spirometer as often as instructed.  Your pain medication is not giving enough relief while using the spirometer.  You develop fever of 100.5 F (38.1 C) or higher. SEEK IMMEDIATE MEDICAL CARE IF:   You cough up bloody sputum that had not been present before.  You develop fever of 102 F (38.9 C) or greater.  You develop worsening pain at or near the incision site. MAKE SURE YOU:   Understand these  instructions.  Will watch your condition.  Will get help right away if you are not doing well or get worse. Document Released: 08/02/2006 Document Revised: 06/14/2011 Document Reviewed: 10/03/2006 [employer] Patient Information 2014 [employer], [place].   ________________________________________________________________________  WHAT IS A BLOOD TRANSFUSION? Blood Transfusion Information  A transfusion is the replacement of blood or some of its parts. Blood is made up of multiple cells which provide different functions.  Red blood cells carry oxygen and are used for blood loss replacement.  White blood cells fight against infection.  Platelets control bleeding.  Plasma helps clot blood.  Other blood products are available for specialized needs, such as hemophilia or other clotting disorders. BEFORE THE TRANSFUSION  Who gives blood for transfusions?   Healthy volunteers who are fully evaluated to make sure their blood is safe. This is blood bank blood. Transfusion therapy is the safest it has ever been in the practice of medicine. Before blood is taken from a donor, a complete history is taken to make sure that person has no history of diseases nor engages in risky social behavior (examples are intravenous drug use or sexual activity with multiple partners). The donor's travel history is screened to minimize risk of transmitting infections, such as malaria. The donated blood is tested for signs of infectious diseases, such as HIV and hepatitis. The blood is then tested to be sure it is compatible with you in order to minimize the chance of a transfusion reaction. If you or a relative donates blood, this is often done in anticipation of surgery and is not appropriate for emergency situations. It takes many days to process the donated blood. RISKS AND COMPLICATIONS Although transfusion therapy is very safe and saves many lives, the main dangers of transfusion include:   Getting an infectious  disease.  Developing a transfusion reaction. This is an allergic reaction  to something in the blood you were given. Every precaution is taken to prevent this. The decision to have a blood transfusion has been considered carefully by your caregiver before blood is given. Blood is not given unless the benefits outweigh the risks. AFTER THE TRANSFUSION  Right after receiving a blood transfusion, you will usually feel much better and more energetic. This is especially true if your red blood cells have gotten low (anemic). The transfusion raises the level of the red blood cells which carry oxygen, and this usually causes an energy increase.  The nurse administering the transfusion will monitor you carefully for complications. HOME CARE INSTRUCTIONS  No special instructions are needed after a transfusion. You may find your energy is better. Speak with your caregiver about any limitations on activity for underlying diseases you may have. SEEK MEDICAL CARE IF:   Your condition is not improving after your transfusion.  You develop redness or irritation at the intravenous (IV) site. SEEK IMMEDIATE MEDICAL CARE IF:  Any of the following symptoms occur over the next 12 hours:  Shaking chills.  You have a temperature by mouth above 102 F (38.9 C), not controlled by medicine.  Chest, back, or muscle pain.  People around you feel you are not acting correctly or are confused.  Shortness of breath or difficulty breathing.  Dizziness and fainting.  You get a rash or develop hives.  You have a decrease in urine output.  Your urine turns a dark color or changes to pink, red, or brown. Any of the following symptoms occur over the next 10 days:  You have a temperature by mouth above 102 F (38.9 C), not controlled by medicine.  Shortness of breath.  Weakness after normal activity.  The white part of the eye turns yellow (jaundice).  You have a decrease in the amount of urine or are  urinating less often.  Your urine turns a dark color or changes to pink, red, or brown. Document Released: 03/19/2000 Document Revised: 06/14/2011 Document Reviewed: 11/06/2007 [facility] Patient Information 2014 [place], [place].  _______________________________________________________________________

## 2013-10-02 NOTE — Pre-Procedure Instructions (Signed)
10-02-13 EKG/ CXR 11'14 Epic.Clearance note 07-18-13 -Dr. Suzan Garibaldi- with chart.

## 2013-10-03 ENCOUNTER — Ambulatory Visit: Payer: Medicare Other | Admitting: Adult Health

## 2013-10-04 ENCOUNTER — Other Ambulatory Visit: Payer: Self-pay | Admitting: Surgical

## 2013-10-04 NOTE — H&P (Signed)
TOTAL KNEE ADMISSION H&P  Patient is being admitted for left total knee arthroplasty.  Subjective:  Chief Complaint:left knee pain.  HPI: Natasha Fletcher, 80 y.o. female, has a history of pain and functional disability in the left knee due to arthritis and has failed non-surgical conservative treatments for greater than 12 weeks to includeNSAID's and/or analgesics, corticosteriod injections, flexibility and strengthening excercises, use of assistive devices and activity modification.  Onset of symptoms was gradual, starting 5 years ago with gradually worsening course since that time. The patient noted no past surgery on the left knee(s).  Patient currently rates pain in the left knee(s) at 7 out of 10 with activity. Patient has night pain, worsening of pain with activity and weight bearing, pain that interferes with activities of daily living, pain with passive range of motion, crepitus and joint swelling.  Patient has evidence of periarticular osteophytes and joint space narrowing by imaging studies.  There is no active infection.  Patient Active Problem List   Diagnosis Date Noted  . Insomnia 03/20/2013  . Unspecified constipation 03/20/2013  . GERD (gastroesophageal reflux disease) 03/20/2013  . S/P total knee arthroplasty   . Glaucoma   . Hypokalemia 02/22/2013  . UTI (urinary tract infection) 02/21/2013  . Postoperative anemia due to acute blood loss 02/20/2013  . OA (osteoarthritis) of knee 02/19/2013  . Knee pain, acute 09/29/2012  . Hypertension   . Arthritis   . Thyroid disease   . Cancer of central portion of female breast 04/14/2011   Past Medical History  Diagnosis Date  . Hypertension   . Asthma   . Glaucoma   . Incontinence   . Wears dentures   . Wears glasses   . Thyroid disease     "something years ago"  . GERD (gastroesophageal reflux disease)   . Cancer     rt breast  . Edema leg   . Fast breathing     "because of pill"  . Pneumonia     h/o younger  .  Arthritis     "all over"  . S/P total knee arthroplasty   . Glaucoma   . Swelling     bilateral feet/ legs, more left.    Past Surgical History  Procedure Laterality Date  . Total hip arthroplasty Bilateral   . Shoulder surgery Left   . Appendectomy    . Carpal tunnel release Bilateral   . Tonsillectomy    . Colonoscopy    . Abdominal hysterectomy    . Eye surgery      both cataracts  . Breast biopsy  04/27/2011    Procedure: BREAST BIOPSY WITH NEEDLE LOCALIZATION;  Surgeon: Edward Jolly, MD;  Location: [facility];  Service: General;  Laterality: Right;  right needle localized breast lumpectomy   . Breast lumpectomy  04/27/11    right breast by [facility]  . Total knee arthroplasty Right 02/19/2013    Procedure: RIGHT TOTAL KNEE ARTHROPLASTY;  Surgeon: Gearlean Alf, MD;  Location: [facility];  Service: Orthopedics;  Laterality: Right;     Current outpatient prescriptions: anastrozole (ARIMIDEX) 1 MG tablet, Take 1 tablet (1 mg total) by mouth daily., Disp: 30 tablet, Rfl: 1;   Calcium Carb-Cholecalciferol (CALCIUM 600 + D PO), Take 1 tablet by mouth 2 (two) times daily., Disp: , Rfl: ;   flurazepam (DALMANE) 15 MG capsule, Take 15 mg by mouth at bedtime as needed for sleep., Disp: , Rfl: ;   furosemide (LASIX) 20 MG  tablet, Take 20 mg by mouth every morning., Disp: , Rfl:  latanoprost (XALATAN) 0.005 % ophthalmic solution, Place 1 drop into both eyes at bedtime., Disp: , Rfl: ;   lisinopril (PRINIVIL,ZESTRIL) 20 MG tablet, Take 20 mg by mouth every morning., Disp: , Rfl: ;   Melaton-Thean-Cham-PassF-LBalm (MELATONIN + L-THEANINE PO), Take 1 capsule by mouth daily as needed (sleep). , Disp: , Rfl: ;   oxybutynin (DITROPAN) 5 MG tablet, Take 5 mg by mouth 3 (three) times daily. To prevent urinary frequency, Disp: , Rfl:  predniSONE (DELTASONE) 5 MG tablet, Take 5 mg by mouth daily with breakfast., Disp: , Rfl: ;   traMADol (ULTRAM) 50 MG tablet, Take 50-100 mg by  mouth every 6 (six) hours as needed for moderate pain. , Disp: , Rfl: ;   Vitamin D, Cholecalciferol, 1000 UNITS TABS, Take 1,000 Units by mouth daily. , Disp: , Rfl:   Allergies  Allergen Reactions  . Milk-Related Compounds Other (See Comments)    Upset stomach   . Sulfa Antibiotics Other (See Comments)    Reaction many years ago-unknown    History  Substance Use Topics  . Smoking status: Former Smoker -- 0.50 packs/day for 30 years    Types: Cigarettes    Quit date: 04/21/1975  . Smokeless tobacco: Never Used  . Alcohol Use: No    Family History  Problem Relation Age of Onset  . Heart disease Mother   . Stroke Father      Review of Systems  Constitutional: Negative.   HENT: Negative.   Eyes: Negative.   Respiratory: Positive for shortness of breath. Negative for cough, hemoptysis, sputum production and wheezing.        SOB with exertion  Cardiovascular: Negative.   Gastrointestinal: Positive for constipation. Negative for heartburn, nausea, vomiting, abdominal pain, diarrhea, blood in stool and melena.  Genitourinary: Positive for frequency. Negative for dysuria, urgency, hematuria and flank pain.  Musculoskeletal: Positive for back pain and joint pain. Negative for falls, myalgias and neck pain.       Left knee pain  Skin: Negative.   Neurological: Negative.   Endo/Heme/Allergies: Negative.   Psychiatric/Behavioral: Negative.     Objective:  Physical Exam  Constitutional: She is oriented to person, place, and time. She appears well-developed and well-nourished. No distress.  HENT:  Head: Normocephalic and atraumatic.  Right Ear: External ear normal.  Left Ear: External ear normal.  Nose: Nose normal.  Mouth/Throat: Oropharynx is clear and moist.  Eyes: Conjunctivae and EOM are normal.  Neck: Normal range of motion. Neck supple.  Cardiovascular: Normal rate, regular rhythm, normal heart sounds and intact distal pulses.   No murmur heard. Respiratory: Effort  normal and breath sounds normal. No respiratory distress. She has no wheezes.  GI: Soft. Bowel sounds are normal. She exhibits no distension. There is no tenderness.  Musculoskeletal:       Right hip: Normal.       Left hip: Normal.       Right knee: Normal.       Left knee: She exhibits decreased range of motion, swelling and effusion. She exhibits no erythema. Tenderness found. Medial joint line and lateral joint line tenderness noted.       Right lower leg: She exhibits no tenderness and no swelling.       Left lower leg: She exhibits no tenderness and no swelling.  Neurological: She is alert and oriented to person, place, and time. She has normal strength and normal  reflexes. No sensory deficit.  Skin: No rash noted. She is not diaphoretic. No erythema.  Psychiatric: She has a normal mood and affect. Her behavior is normal.   The range of motion in her right knee is about 0-120. She has no tenderness or instability. The left knee shows a moderate effusion. There is marked crepitus on range of motion. Range is 5-120 with tenderness, medial greater than lateral. No instability.   Imaging Review Plain radiographs demonstrate severe degenerative joint disease of the left knee(s). The overall alignment ismild valgus. The bone quality appears to be adequate for age and reported activity level.  Assessment/Plan:  End stage arthritis, left knee   The patient history, physical examination, clinical judgment of the provider and imaging studies are consistent with end stage degenerative joint disease of the left knee(s) and total knee arthroplasty is deemed medically necessary. The treatment options including medical management, injection therapy arthroscopy and arthroplasty were discussed at length. The risks and benefits of total knee arthroplasty were presented and reviewed. The risks due to aseptic loosening, infection, stiffness, patella tracking problems, thromboembolic complications and other  imponderables were discussed. The patient acknowledged the explanation, agreed to proceed with the plan and consent was signed. Patient is being admitted for inpatient treatment for surgery, pain control, PT, OT, prophylactic antibiotics, VTE prophylaxis, progressive ambulation and ADL's and discharge planning. The patient is planning to be discharged home with home health services     [place], [place]

## 2013-10-08 ENCOUNTER — Encounter: Payer: Self-pay | Admitting: Physician Assistant

## 2013-10-08 ENCOUNTER — Ambulatory Visit (HOSPITAL_BASED_OUTPATIENT_CLINIC_OR_DEPARTMENT_OTHER): Payer: Medicare Other | Admitting: Physician Assistant

## 2013-10-08 VITALS — BP 157/71 | HR 80 | Temp 97.7°F | Resp 18 | Ht 66.0 in | Wt 152.9 lb

## 2013-10-08 DIAGNOSIS — C50919 Malignant neoplasm of unspecified site of unspecified female breast: Secondary | ICD-10-CM

## 2013-10-08 DIAGNOSIS — C50119 Malignant neoplasm of central portion of unspecified female breast: Secondary | ICD-10-CM

## 2013-10-08 DIAGNOSIS — Z170 Estrogen receptor positive status [ER+]: Secondary | ICD-10-CM

## 2013-10-08 MED ORDER — ANASTROZOLE 1 MG PO TABS: 1.0000 mg | ORAL_TABLET | Freq: Every day | ORAL | Status: DC

## 2013-10-08 NOTE — Patient Instructions (Signed)
Continue Arimidex 1 mg by mouth daily Followup in 6 months

## 2013-10-08 NOTE — Progress Notes (Signed)
OFFICE PROGRESS NOTE  CC  ROBERTS, [name], MD [address], [place] [address] Dr. Excell Seltzer Dr. Thea Silversmith  DIAGNOSIS: 80 year old female with diagnosis of right breast cancer status post right lumpectomy and with the final pathology revealing a 0.9 cm ER positive breast cancer.  PRIOR THERAPY:  #1 patient underwent a right breast lumpectomy for a 0.9 cm ER positive PR positive HER-2/neu negative invasive ductal carcinoma of the right breast.  #2 patient was then begun on antiestrogen therapy by Dr. Eston Esters. Patient subsequently wanted to change her care to Dr. Humphrey Rolls  CURRENT THERAPY: Arimidex 1 mg daily  INTERVAL HISTORY: Natasha Fletcher 80 y.o. female is here for an interim visit.  Since last being seen in our office she reports that she had a right knee replacement performed in approximately 02/18/2013. She is scheduled for a left knee replacement on 10/15/2013. Overall she has been well with the exception of the knee issues. She is tolerating the Arimidex without difficulty. She denied any hot flashes, myalgias, nausea or vomiting. She's had no night sweats or significant weight loss. She reports occasional episodes of constipation however these resolved spontaneously without having to take any over-the-counter medications. She continues to followup with her primary care physician as scheduled. She had a diagnostic bilateral mammogram in January of 2015 and there is no mammographic evidence of malignancy. She requests a refill for her Arimidex.    MEDICAL HISTORY: Past Medical History  Diagnosis Date  . Hypertension   . Asthma   . Glaucoma   . Incontinence   . Wears dentures   . Wears glasses   . Thyroid disease     "something years ago"  . GERD (gastroesophageal reflux disease)   . Cancer     rt breast  . Edema leg   . Fast breathing     "because of pill"  . Pneumonia     h/o younger  . Arthritis     "all over"  . S/P total knee  arthroplasty   . Glaucoma   . Swelling     bilateral feet/ legs, more left.    ALLERGIES:  is allergic to milk-related compounds and sulfa antibiotics.  MEDICATIONS:  Current Outpatient Prescriptions  Medication Sig Dispense Refill  . anastrozole (ARIMIDEX) 1 MG tablet Take 1 tablet (1 mg total) by mouth daily.  30 tablet  5  . Calcium Carb-Cholecalciferol (CALCIUM 600 + D PO) Take 1 tablet by mouth 2 (two) times daily.      . flurazepam (DALMANE) 15 MG capsule Take 15 mg by mouth at bedtime as needed for sleep.      Marland Kitchen HYDROcodone-acetaminophen (NORCO) 7.5-325 MG per tablet       . latanoprost (XALATAN) 0.005 % ophthalmic solution Place 1 drop into both eyes at bedtime.      Marland Kitchen lisinopril (PRINIVIL,ZESTRIL) 20 MG tablet Take 20 mg by mouth every morning.      Marland Kitchen oxybutynin (DITROPAN) 5 MG tablet Take 5 mg by mouth 3 (three) times daily. To prevent urinary frequency      . predniSONE (DELTASONE) 5 MG tablet Take 5 mg by mouth daily with breakfast.      . furosemide (LASIX) 20 MG tablet Take 20 mg by mouth every morning.      . Melaton-Thean-Cham-PassF-LBalm (MELATONIN + L-THEANINE PO) Take 1 capsule by mouth daily as needed (sleep).       . traMADol (ULTRAM) 50 MG tablet Take 50-100 mg by mouth  every 6 (six) hours as needed for moderate pain.       . Vitamin D, Cholecalciferol, 1000 UNITS TABS Take 1,000 Units by mouth daily.        No current facility-administered medications for this visit.    SURGICAL HISTORY:  Past Surgical History  Procedure Laterality Date  . Total hip arthroplasty Bilateral   . Shoulder surgery Left   . Appendectomy    . Carpal tunnel release Bilateral   . Tonsillectomy    . Colonoscopy    . Abdominal hysterectomy    . Eye surgery      both cataracts  . Breast biopsy  04/27/2011    Procedure: BREAST BIOPSY WITH NEEDLE LOCALIZATION;  Surgeon: Edward Jolly, MD;  Location: [facility];  Service: General;  Laterality: Right;  right needle  localized breast lumpectomy   . Breast lumpectomy  04/27/11    right breast by [facility]  . Total knee arthroplasty Right 02/19/2013    Procedure: RIGHT TOTAL KNEE ARTHROPLASTY;  Surgeon: Gearlean Alf, MD;  Location: [facility];  Service: Orthopedics;  Laterality: Right;    REVIEW OF SYSTEMS:   General: fatigue (-), night sweats (-), fever (-), pain (-) Lymph: palpable nodes (-) HEENT: vision changes (-), mucositis (-), gum bleeding (-), epistaxis (-) Cardiovascular: chest pain (-), palpitations (-) Pulmonary: shortness of breath (-), dyspnea on exertion (-), cough (-), hemoptysis (-) GI:  Early satiety (-), melena (-), dysphagia (-), nausea/vomiting (-), diarrhea (-) GU: dysuria (-), hematuria (-), incontinence (-) Musculoskeletal: joint swelling (-), joint pain (+) left knee pain, back pain (-)  Neuro: weakness (-), numbness (-), headache (-), confusion (-) Skin: Rash (-), lesions (-), dryness (-) Psych: depression (-), suicidal/homicidal ideation (-), feeling of hopelessness (-)  Health Maintenance  Mammogram:05/03/12 Colonoscopy:3 years ago Bone Density Scan:2/13 Pap Smear: unknown Eye Exam: 09/13 Vitamin D Level: 07/13 (normal) Lipid Panel: 3 years ago   PHYSICAL EXAMINATION:  BP 157/71  Pulse 80  Temp(Src) 97.7 F (36.5 C) (Oral)  Resp 18  Ht _0  (1.676 m)  Wt 152 lb 14.4 oz (69.355 kg)  BMI 24.69 kg/m2 General: Patient is a well appearing female in no acute distress HEENT: PERRLA, sclerae anicteric no conjunctival pallor, mucous membranes are moist Neck: supple, no palpable adenopathy Lungs: clear to auscultation bilaterally, no wheezes, rhonchi, or rales Cardiovascular: regular rate rhythm, S1, S2, no murmurs, rubs or gallops Abdomen: Soft, non-tender, non-distended, normoactive bowel sounds, no HSM Extremities: warm and well perfused, no clubbing, cyanosis, or edema Skin: No rashes or lesions Neuro: Non-focal Right breast: healed incisional scar no masses or  nipple discharge, no skin changes Left breast: no masses nipple discharge, no skin changes. ECOG PERFORMANCE STATUS: 1 - Symptomatic but completely ambulatory  LABORATORY DATA: Lab Results  Component Value Date   WBC 5.1 10/02/2013   HGB 11.6* 10/02/2013   HCT 36.4 10/02/2013   MCV 82.4 10/02/2013   PLT 244 10/02/2013      Chemistry      Component Value Date/Time   NA 142 10/02/2013 1005   NA 143 06/30/2012 1408   K 3.6* 10/02/2013 1005   K 4.1 06/30/2012 1408   CL 102 10/02/2013 1005   CL 106 06/30/2012 1408   CO2 27 10/02/2013 1005   CO2 28 06/30/2012 1408   BUN 11 10/02/2013 1005   BUN 17.5 06/30/2012 1408   CREATININE 0.74 10/02/2013 1005   CREATININE 1.2* 06/30/2012 1408  Component Value Date/Time   CALCIUM 9.9 10/02/2013 1005   CALCIUM 10.1 06/30/2012 1408   ALKPHOS 95 10/02/2013 1005   ALKPHOS 86 06/30/2012 1408   AST 20 10/02/2013 1005   AST 20 06/30/2012 1408   ALT 13 10/02/2013 1005   ALT 13 06/30/2012 1408   BILITOT 0.8 10/02/2013 1005   BILITOT 0.67 06/30/2012 1408       RADIOGRAPHIC STUDIES:  No results found.  ASSESSMENT: 80 year old female with stage I invasive ductal carcinoma of the right breast status post lumpectomy followed by antiestrogen therapy adjuvantly. Overall she's doing well tolerating it well. Her overall prognosis is I think excellent. She will continue the Arimidex for a total of 5 years risks and benefits of this were again discussed with her. She started Arimidex therapy in the spring of 2013  PLAN:   1.  continue her Arimidex 1m by mouth daily. A refill prescription was sent to her pharmacy of record via E. scribed   2. she is due to have her left knee replacement on 10/15/2013, status post right knee replacement approximately 02/18/2013. When she has recovered from the left knee surgery the patient plans to return to work water aerobics for exercise.  3. we'll have her return in 6 months to establish care with Dr. GLindi Adieand followup of her  tolerance of Arimidex. 4.  I have placed an order for her diagnostic bilateral mammograms to be done in late January or early February 2016.    All questions were answered. The patient knows to call the clinic with any problems, questions or concerns. We can certainly see the patient much sooner if necessary.  I spent 25 minutes counseling the patient face to face. The total time spent in the appointment was 30 minutes.   JSampson SiMedical/Oncology [phone](Office)  10/08/2013, 4:20 PM

## 2013-10-09 ENCOUNTER — Telehealth: Payer: Self-pay | Admitting: Physician Assistant

## 2013-10-09 NOTE — Telephone Encounter (Signed)
, °

## 2013-10-15 ENCOUNTER — Encounter (HOSPITAL_COMMUNITY): Payer: Self-pay | Admitting: *Deleted

## 2013-10-15 ENCOUNTER — Encounter (HOSPITAL_COMMUNITY): Payer: Medicare Other | Admitting: Certified Registered"

## 2013-10-15 ENCOUNTER — Inpatient Hospital Stay (HOSPITAL_COMMUNITY)
Admission: RE | Admit: 2013-10-15 | Discharge: 2013-10-20 | DRG: 470 | Disposition: A | Discharge status: Skilled Nursing Facility | Payer: Medicare Other | Source: Ambulatory Visit | Attending: Orthopedic Surgery | Admitting: Orthopedic Surgery

## 2013-10-15 ENCOUNTER — Inpatient Hospital Stay (HOSPITAL_COMMUNITY): Payer: Medicare Other | Admitting: Certified Registered"

## 2013-10-15 ENCOUNTER — Encounter (HOSPITAL_COMMUNITY)
Admission: RE | Disposition: A | Discharge status: Skilled Nursing Facility | Payer: Self-pay | Source: Ambulatory Visit | Attending: Orthopedic Surgery

## 2013-10-15 DIAGNOSIS — E079 Disorder of thyroid, unspecified: Secondary | ICD-10-CM | POA: Diagnosis present

## 2013-10-15 DIAGNOSIS — E860 Dehydration: Secondary | ICD-10-CM

## 2013-10-15 DIAGNOSIS — R55 Syncope and collapse: Secondary | ICD-10-CM | POA: Diagnosis not present

## 2013-10-15 DIAGNOSIS — Z823 Family history of stroke: Secondary | ICD-10-CM | POA: Diagnosis not present

## 2013-10-15 DIAGNOSIS — R339 Retention of urine, unspecified: Secondary | ICD-10-CM | POA: Diagnosis not present

## 2013-10-15 DIAGNOSIS — R42 Dizziness and giddiness: Secondary | ICD-10-CM | POA: Diagnosis not present

## 2013-10-15 DIAGNOSIS — IMO0002 Reserved for concepts with insufficient information to code with codable children: Secondary | ICD-10-CM

## 2013-10-15 DIAGNOSIS — I10 Essential (primary) hypertension: Secondary | ICD-10-CM | POA: Diagnosis present

## 2013-10-15 DIAGNOSIS — R031 Nonspecific low blood-pressure reading: Secondary | ICD-10-CM | POA: Diagnosis not present

## 2013-10-15 DIAGNOSIS — D62 Acute posthemorrhagic anemia: Secondary | ICD-10-CM | POA: Diagnosis not present

## 2013-10-15 DIAGNOSIS — Z853 Personal history of malignant neoplasm of breast: Secondary | ICD-10-CM | POA: Diagnosis not present

## 2013-10-15 DIAGNOSIS — M1710 Unilateral primary osteoarthritis, unspecified knee: Secondary | ICD-10-CM | POA: Diagnosis present

## 2013-10-15 DIAGNOSIS — J45909 Unspecified asthma, uncomplicated: Secondary | ICD-10-CM | POA: Diagnosis present

## 2013-10-15 DIAGNOSIS — Z8249 Family history of ischemic heart disease and other diseases of the circulatory system: Secondary | ICD-10-CM

## 2013-10-15 DIAGNOSIS — Z79899 Other long term (current) drug therapy: Secondary | ICD-10-CM | POA: Diagnosis not present

## 2013-10-15 DIAGNOSIS — Z96652 Presence of left artificial knee joint: Secondary | ICD-10-CM

## 2013-10-15 DIAGNOSIS — C50119 Malignant neoplasm of central portion of unspecified female breast: Secondary | ICD-10-CM

## 2013-10-15 DIAGNOSIS — H409 Unspecified glaucoma: Secondary | ICD-10-CM | POA: Diagnosis present

## 2013-10-15 DIAGNOSIS — I959 Hypotension, unspecified: Secondary | ICD-10-CM | POA: Diagnosis not present

## 2013-10-15 DIAGNOSIS — Z96659 Presence of unspecified artificial knee joint: Secondary | ICD-10-CM

## 2013-10-15 DIAGNOSIS — G4700 Insomnia, unspecified: Secondary | ICD-10-CM | POA: Diagnosis present

## 2013-10-15 DIAGNOSIS — Z01812 Encounter for preprocedural laboratory examination: Secondary | ICD-10-CM | POA: Diagnosis not present

## 2013-10-15 DIAGNOSIS — Z96649 Presence of unspecified artificial hip joint: Secondary | ICD-10-CM

## 2013-10-15 DIAGNOSIS — K5900 Constipation, unspecified: Secondary | ICD-10-CM | POA: Diagnosis present

## 2013-10-15 DIAGNOSIS — K219 Gastro-esophageal reflux disease without esophagitis: Secondary | ICD-10-CM | POA: Diagnosis present

## 2013-10-15 DIAGNOSIS — R32 Unspecified urinary incontinence: Secondary | ICD-10-CM | POA: Diagnosis present

## 2013-10-15 DIAGNOSIS — M179 Osteoarthritis of knee, unspecified: Secondary | ICD-10-CM | POA: Diagnosis present

## 2013-10-15 DIAGNOSIS — M25569 Pain in unspecified knee: Secondary | ICD-10-CM | POA: Diagnosis present

## 2013-10-15 DIAGNOSIS — M1712 Unilateral primary osteoarthritis, left knee: Secondary | ICD-10-CM

## 2013-10-15 DIAGNOSIS — Z87891 Personal history of nicotine dependence: Secondary | ICD-10-CM | POA: Diagnosis not present

## 2013-10-15 HISTORY — PX: TOTAL KNEE ARTHROPLASTY: SHX125

## 2013-10-15 SURGERY — ARTHROPLASTY, KNEE, TOTAL
Anesthesia: Spinal | Site: Knee | Laterality: Left

## 2013-10-15 MED ORDER — RIVAROXABAN 10 MG PO TABS
10.0000 mg | ORAL_TABLET | Freq: Every day | ORAL | Status: DC
  Administered 2013-10-16 – 2013-10-20 (×5): 10 mg via ORAL
  Filled 2013-10-15 (×7): qty 1

## 2013-10-15 MED ORDER — MENTHOL 3 MG MT LOZG
1.0000 | LOZENGE | OROMUCOSAL | Status: DC | PRN
  Filled 2013-10-15: qty 9

## 2013-10-15 MED ORDER — LACTATED RINGERS IV SOLN
INTRAVENOUS | Status: DC
Start: 2013-10-15 — End: 2013-10-15
  Administered 2013-10-15: 1000 mL via INTRAVENOUS
  Administered 2013-10-15: 11:00:00 via INTRAVENOUS

## 2013-10-15 MED ORDER — METOCLOPRAMIDE HCL 5 MG/ML IJ SOLN: 5.0000 mg | Freq: Three times a day (TID) | INTRAMUSCULAR | Status: DC | PRN

## 2013-10-15 MED ORDER — ACETAMINOPHEN 10 MG/ML IV SOLN
1000.0000 mg | Freq: Once | INTRAVENOUS | Status: AC
  Administered 2013-10-15: 1000 mg via INTRAVENOUS
  Filled 2013-10-15: qty 100

## 2013-10-15 MED ORDER — PROPOFOL 10 MG/ML IV BOLUS
INTRAVENOUS | Status: AC
  Filled 2013-10-15: qty 20

## 2013-10-15 MED ORDER — FENTANYL CITRATE 0.05 MG/ML IJ SOLN
INTRAMUSCULAR | Status: DC | PRN
  Administered 2013-10-15 (×2): 50 ug via INTRAVENOUS

## 2013-10-15 MED ORDER — KCL IN DEXTROSE-NACL 20-5-0.45 MEQ/L-%-% IV SOLN
INTRAVENOUS | Status: DC
  Administered 2013-10-15 – 2013-10-16 (×4): via INTRAVENOUS
  Filled 2013-10-15 (×7): qty 1000

## 2013-10-15 MED ORDER — OXYBUTYNIN CHLORIDE 5 MG PO TABS
5.0000 mg | ORAL_TABLET | Freq: Three times a day (TID) | ORAL | Status: DC
  Administered 2013-10-15 – 2013-10-20 (×16): 5 mg via ORAL
  Filled 2013-10-15 (×17): qty 1

## 2013-10-15 MED ORDER — DEXAMETHASONE SODIUM PHOSPHATE 10 MG/ML IJ SOLN
INTRAMUSCULAR | Status: AC
  Filled 2013-10-15: qty 1

## 2013-10-15 MED ORDER — SODIUM CHLORIDE 0.9 % IJ SOLN
INTRAMUSCULAR | Status: DC | PRN
  Administered 2013-10-15: 20 mL via INTRAVENOUS

## 2013-10-15 MED ORDER — HYDROMORPHONE HCL PF 1 MG/ML IJ SOLN
1.0000 mg | INTRAMUSCULAR | Status: DC | PRN
  Administered 2013-10-16 (×2): 1 mg via INTRAVENOUS
  Filled 2013-10-15 (×4): qty 1

## 2013-10-15 MED ORDER — TEMAZEPAM 15 MG PO CAPS: 15.0000 mg | ORAL_CAPSULE | Freq: Every evening | ORAL | Status: DC | PRN

## 2013-10-15 MED ORDER — FENTANYL CITRATE 0.05 MG/ML IJ SOLN: 25.0000 ug | INTRAMUSCULAR | Status: DC | PRN

## 2013-10-15 MED ORDER — ACETAMINOPHEN 10 MG/ML IV SOLN: INTRAVENOUS | Status: DC | PRN

## 2013-10-15 MED ORDER — SODIUM CHLORIDE 0.9 % IJ SOLN
INTRAMUSCULAR | Status: AC
  Filled 2013-10-15: qty 50

## 2013-10-15 MED ORDER — MEPERIDINE HCL 50 MG/ML IJ SOLN: 6.2500 mg | INTRAMUSCULAR | Status: DC | PRN

## 2013-10-15 MED ORDER — ONDANSETRON HCL 4 MG PO TABS: 4.0000 mg | ORAL_TABLET | Freq: Four times a day (QID) | ORAL | Status: DC | PRN

## 2013-10-15 MED ORDER — CEFAZOLIN SODIUM-DEXTROSE 2-3 GM-% IV SOLR
2.0000 g | Freq: Four times a day (QID) | INTRAVENOUS | Status: AC
  Administered 2013-10-15 – 2013-10-16 (×2): 2 g via INTRAVENOUS
  Filled 2013-10-15 (×2): qty 50

## 2013-10-15 MED ORDER — ONDANSETRON HCL 4 MG/2ML IJ SOLN
INTRAMUSCULAR | Status: AC
  Filled 2013-10-15: qty 2

## 2013-10-15 MED ORDER — BUPIVACAINE IN DEXTROSE 0.75-8.25 % IT SOLN
INTRATHECAL | Status: DC | PRN
  Administered 2013-10-15: 1.6 mL via INTRATHECAL

## 2013-10-15 MED ORDER — MORPHINE SULFATE 2 MG/ML IJ SOLN
1.0000 mg | INTRAMUSCULAR | Status: DC | PRN
  Administered 2013-10-15 (×4): 2 mg via INTRAVENOUS
  Administered 2013-10-15: 1 mg via INTRAVENOUS
  Filled 2013-10-15 (×5): qty 1

## 2013-10-15 MED ORDER — FENTANYL CITRATE 0.05 MG/ML IJ SOLN
INTRAMUSCULAR | Status: AC
  Filled 2013-10-15: qty 2

## 2013-10-15 MED ORDER — ONDANSETRON HCL 4 MG/2ML IJ SOLN
INTRAMUSCULAR | Status: DC | PRN
  Administered 2013-10-15: 4 mg via INTRAVENOUS

## 2013-10-15 MED ORDER — 0.9 % SODIUM CHLORIDE (POUR BTL) OPTIME
TOPICAL | Status: DC | PRN
  Administered 2013-10-15: 1000 mL

## 2013-10-15 MED ORDER — CHLORHEXIDINE GLUCONATE 4 % EX LIQD: 60.0000 mL | Freq: Once | CUTANEOUS | Status: DC

## 2013-10-15 MED ORDER — DEXAMETHASONE SODIUM PHOSPHATE 10 MG/ML IJ SOLN: INTRAMUSCULAR | Status: DC | PRN

## 2013-10-15 MED ORDER — FUROSEMIDE 20 MG PO TABS
20.0000 mg | ORAL_TABLET | Freq: Every morning | ORAL | Status: DC
  Administered 2013-10-15 – 2013-10-20 (×6): 20 mg via ORAL
  Filled 2013-10-15 (×6): qty 1

## 2013-10-15 MED ORDER — FLEET ENEMA 7-19 GM/118ML RE ENEM: 1.0000 | ENEMA | Freq: Once | RECTAL | Status: AC | PRN

## 2013-10-15 MED ORDER — OXYCODONE HCL 5 MG PO TABS
5.0000 mg | ORAL_TABLET | ORAL | Status: DC | PRN
  Administered 2013-10-15 (×2): 5 mg via ORAL
  Administered 2013-10-16 – 2013-10-18 (×10): 10 mg via ORAL
  Administered 2013-10-18 – 2013-10-19 (×4): 5 mg via ORAL
  Filled 2013-10-15: qty 1
  Filled 2013-10-15 (×2): qty 2
  Filled 2013-10-15 (×2): qty 1
  Filled 2013-10-15 (×5): qty 2
  Filled 2013-10-15: qty 1
  Filled 2013-10-15: qty 2
  Filled 2013-10-15: qty 1
  Filled 2013-10-15 (×4): qty 2

## 2013-10-15 MED ORDER — LIDOCAINE HCL (CARDIAC) 20 MG/ML IV SOLN
INTRAVENOUS | Status: AC
  Filled 2013-10-15: qty 5

## 2013-10-15 MED ORDER — METOCLOPRAMIDE HCL 10 MG PO TABS: 5.0000 mg | ORAL_TABLET | Freq: Three times a day (TID) | ORAL | Status: DC | PRN

## 2013-10-15 MED ORDER — CEFAZOLIN SODIUM-DEXTROSE 2-3 GM-% IV SOLR
2.0000 g | INTRAVENOUS | Status: AC
  Administered 2013-10-15: 2 g via INTRAVENOUS

## 2013-10-15 MED ORDER — ACETAMINOPHEN 650 MG RE SUPP: 650.0000 mg | Freq: Four times a day (QID) | RECTAL | Status: DC | PRN

## 2013-10-15 MED ORDER — LATANOPROST 0.005 % OP SOLN
1.0000 [drp] | Freq: Every day | OPHTHALMIC | Status: DC
  Administered 2013-10-15 – 2013-10-19 (×5): 1 [drp] via OPHTHALMIC
  Filled 2013-10-15: qty 2.5

## 2013-10-15 MED ORDER — PREDNISONE 5 MG PO TABS
5.0000 mg | ORAL_TABLET | Freq: Two times a day (BID) | ORAL | Status: AC
  Administered 2013-10-17 (×2): 5 mg via ORAL
  Filled 2013-10-15 (×2): qty 1

## 2013-10-15 MED ORDER — BUPIVACAINE LIPOSOME 1.3 % IJ SUSP
20.0000 mL | Freq: Once | INTRAMUSCULAR | Status: DC
  Filled 2013-10-15: qty 20

## 2013-10-15 MED ORDER — PREDNISONE 10 MG PO TABS
10.0000 mg | ORAL_TABLET | Freq: Two times a day (BID) | ORAL | Status: AC
  Administered 2013-10-16 (×2): 10 mg via ORAL
  Filled 2013-10-15 (×2): qty 1

## 2013-10-15 MED ORDER — POLYETHYLENE GLYCOL 3350 17 G PO PACK
17.0000 g | PACK | Freq: Every day | ORAL | Status: DC | PRN
  Administered 2013-10-18: 17 g via ORAL

## 2013-10-15 MED ORDER — DEXAMETHASONE SODIUM PHOSPHATE 10 MG/ML IJ SOLN
10.0000 mg | Freq: Once | INTRAMUSCULAR | Status: AC
  Administered 2013-10-15: 10 mg via INTRAVENOUS

## 2013-10-15 MED ORDER — BUPIVACAINE HCL (PF) 0.25 % IJ SOLN
INTRAMUSCULAR | Status: AC
  Filled 2013-10-15: qty 30

## 2013-10-15 MED ORDER — ACETAMINOPHEN 500 MG PO TABS
1000.0000 mg | ORAL_TABLET | Freq: Four times a day (QID) | ORAL | Status: AC
  Administered 2013-10-15 – 2013-10-16 (×4): 1000 mg via ORAL
  Filled 2013-10-15 (×4): qty 2

## 2013-10-15 MED ORDER — SODIUM CHLORIDE 0.9 % IV SOLN: INTRAVENOUS | Status: DC

## 2013-10-15 MED ORDER — BUPIVACAINE LIPOSOME 1.3 % IJ SUSP
INTRAMUSCULAR | Status: DC | PRN
  Administered 2013-10-15: 20 mL

## 2013-10-15 MED ORDER — DEXAMETHASONE 6 MG PO TABS
10.0000 mg | ORAL_TABLET | Freq: Every day | ORAL | Status: AC
  Administered 2013-10-16: 10 mg via ORAL
  Filled 2013-10-15: qty 1

## 2013-10-15 MED ORDER — PROPOFOL INFUSION 10 MG/ML OPTIME
INTRAVENOUS | Status: DC | PRN
  Administered 2013-10-15: 75 ug/kg/min via INTRAVENOUS

## 2013-10-15 MED ORDER — DEXAMETHASONE SODIUM PHOSPHATE 10 MG/ML IJ SOLN
10.0000 mg | Freq: Every day | INTRAMUSCULAR | Status: AC
  Filled 2013-10-15: qty 1

## 2013-10-15 MED ORDER — PHENOL 1.4 % MT LIQD
1.0000 | OROMUCOSAL | Status: DC | PRN
  Filled 2013-10-15: qty 177

## 2013-10-15 MED ORDER — LIDOCAINE HCL (CARDIAC) 20 MG/ML IV SOLN
INTRAVENOUS | Status: DC | PRN
  Administered 2013-10-15: 50 mg via INTRAVENOUS

## 2013-10-15 MED ORDER — TRAMADOL HCL 50 MG PO TABS
50.0000 mg | ORAL_TABLET | Freq: Four times a day (QID) | ORAL | Status: DC | PRN
  Administered 2013-10-16 (×2): 50 mg via ORAL
  Administered 2013-10-17 (×2): 100 mg via ORAL
  Administered 2013-10-18 – 2013-10-19 (×2): 50 mg via ORAL
  Administered 2013-10-20: 100 mg via ORAL
  Filled 2013-10-15 (×2): qty 1
  Filled 2013-10-15 (×2): qty 2
  Filled 2013-10-15 (×3): qty 1
  Filled 2013-10-15: qty 2

## 2013-10-15 MED ORDER — CEFAZOLIN SODIUM-DEXTROSE 2-3 GM-% IV SOLR
INTRAVENOUS | Status: AC
  Filled 2013-10-15: qty 50

## 2013-10-15 MED ORDER — KETOROLAC TROMETHAMINE 15 MG/ML IJ SOLN
7.5000 mg | Freq: Four times a day (QID) | INTRAMUSCULAR | Status: AC | PRN
  Administered 2013-10-15 – 2013-10-16 (×2): 7.5 mg via INTRAVENOUS
  Filled 2013-10-15 (×2): qty 1

## 2013-10-15 MED ORDER — PROMETHAZINE HCL 25 MG/ML IJ SOLN: 6.2500 mg | INTRAMUSCULAR | Status: DC | PRN

## 2013-10-15 MED ORDER — BISACODYL 10 MG RE SUPP: 10.0000 mg | Freq: Every day | RECTAL | Status: DC | PRN

## 2013-10-15 MED ORDER — SODIUM CHLORIDE 0.9 % IR SOLN
Status: DC | PRN
  Administered 2013-10-15: 1000 mL

## 2013-10-15 MED ORDER — ONDANSETRON HCL 4 MG/2ML IJ SOLN
4.0000 mg | Freq: Four times a day (QID) | INTRAMUSCULAR | Status: DC | PRN
  Administered 2013-10-15: 4 mg via INTRAVENOUS
  Filled 2013-10-15: qty 2

## 2013-10-15 MED ORDER — ANASTROZOLE 1 MG PO TABS
1.0000 mg | ORAL_TABLET | Freq: Every day | ORAL | Status: DC
  Administered 2013-10-16 – 2013-10-20 (×5): 1 mg via ORAL
  Filled 2013-10-15 (×7): qty 1

## 2013-10-15 MED ORDER — PREDNISONE 5 MG PO TABS
5.0000 mg | ORAL_TABLET | Freq: Every day | ORAL | Status: DC
  Administered 2013-10-18 – 2013-10-20 (×3): 5 mg via ORAL
  Filled 2013-10-15 (×4): qty 1

## 2013-10-15 MED ORDER — TRANEXAMIC ACID 100 MG/ML IV SOLN
1000.0000 mg | INTRAVENOUS | Status: AC
  Administered 2013-10-15: 1000 mg via INTRAVENOUS
  Filled 2013-10-15: qty 10

## 2013-10-15 MED ORDER — DIPHENHYDRAMINE HCL 12.5 MG/5ML PO ELIX
12.5000 mg | ORAL_SOLUTION | ORAL | Status: DC | PRN
Start: 2013-10-15 — End: 2013-10-20

## 2013-10-15 MED ORDER — DOCUSATE SODIUM 100 MG PO CAPS
100.0000 mg | ORAL_CAPSULE | Freq: Two times a day (BID) | ORAL | Status: DC
  Administered 2013-10-15 – 2013-10-20 (×10): 100 mg via ORAL

## 2013-10-15 MED ORDER — ACETAMINOPHEN 325 MG PO TABS: 650.0000 mg | ORAL_TABLET | Freq: Four times a day (QID) | ORAL | Status: DC | PRN

## 2013-10-15 MED ORDER — PREDNISONE 20 MG PO TABS
20.0000 mg | ORAL_TABLET | Freq: Once | ORAL | Status: AC
  Administered 2013-10-15: 20 mg via ORAL
  Filled 2013-10-15: qty 1

## 2013-10-15 MED ORDER — METHOCARBAMOL 500 MG PO TABS
500.0000 mg | ORAL_TABLET | Freq: Four times a day (QID) | ORAL | Status: DC | PRN
Start: 2013-10-15 — End: 2013-10-17
  Administered 2013-10-16 – 2013-10-17 (×4): 500 mg via ORAL
  Filled 2013-10-15 (×4): qty 1

## 2013-10-15 MED ORDER — OXYCODONE HCL 5 MG PO TABS
5.0000 mg | ORAL_TABLET | Freq: Once | ORAL | Status: AC
  Administered 2013-10-15: 5 mg via ORAL
  Filled 2013-10-15: qty 1

## 2013-10-15 MED ORDER — METHOCARBAMOL 1000 MG/10ML IJ SOLN
500.0000 mg | Freq: Four times a day (QID) | INTRAVENOUS | Status: DC | PRN
  Administered 2013-10-15: 500 mg via INTRAVENOUS
  Filled 2013-10-15: qty 5

## 2013-10-15 SURGICAL SUPPLY — 58 items
BAG ZIPLOCK 12X15 (MISCELLANEOUS) ×2 IMPLANT
BANDAGE ELASTIC 6 VELCRO ST LF (GAUZE/BANDAGES/DRESSINGS) ×2 IMPLANT
BANDAGE ESMARK 6X9 LF (GAUZE/BANDAGES/DRESSINGS) ×1 IMPLANT
BLADE SAG 18X100X1.27 (BLADE) ×2 IMPLANT
BLADE SAW SGTL 11.0X1.19X90.0M (BLADE) ×2 IMPLANT
BNDG ESMARK 6X9 LF (GAUZE/BANDAGES/DRESSINGS) ×2
BOWL SMART MIX CTS (DISPOSABLE) ×2 IMPLANT
CAPT RP KNEE ×2 IMPLANT
CEMENT HV SMART SET (Cement) ×4 IMPLANT
CUFF TOURN SGL QUICK 34 (TOURNIQUET CUFF) ×1
CUFF TRNQT CYL 34X4X40X1 (TOURNIQUET CUFF) ×1 IMPLANT
DECANTER SPIKE VIAL GLASS SM (MISCELLANEOUS) ×2 IMPLANT
DRAPE EXTREMITY T 121X128X90 (DRAPE) ×2 IMPLANT
DRAPE POUCH INSTRU U-SHP 10X18 (DRAPES) ×2 IMPLANT
DRAPE U-SHAPE 47X51 STRL (DRAPES) ×2 IMPLANT
DRSG ADAPTIC 3X8 NADH LF (GAUZE/BANDAGES/DRESSINGS) ×2 IMPLANT
DRSG PAD ABDOMINAL 8X10 ST (GAUZE/BANDAGES/DRESSINGS) ×2 IMPLANT
DURAPREP 26ML APPLICATOR (WOUND CARE) ×2 IMPLANT
ELECT REM PT RETURN 9FT ADLT (ELECTROSURGICAL) ×2
ELECTRODE REM PT RTRN 9FT ADLT (ELECTROSURGICAL) ×1 IMPLANT
EVACUATOR 1/8 PVC DRAIN (DRAIN) ×2 IMPLANT
FACESHIELD WRAPAROUND (MASK) ×10 IMPLANT
GAUZE SPONGE 4X4 12PLY STRL (GAUZE/BANDAGES/DRESSINGS) ×2 IMPLANT
GLOVE BIO SURGEON STRL SZ7.5 (GLOVE) ×2 IMPLANT
GLOVE BIO SURGEON STRL SZ8 (GLOVE) ×2 IMPLANT
GLOVE BIOGEL PI IND STRL 6.5 (GLOVE) IMPLANT
GLOVE BIOGEL PI IND STRL 8 (GLOVE) ×1 IMPLANT
GLOVE BIOGEL PI INDICATOR 6.5 (GLOVE)
GLOVE BIOGEL PI INDICATOR 8 (GLOVE) ×1
GLOVE SURG SS PI 6.5 STRL IVOR (GLOVE) ×2 IMPLANT
GOWN STRL REUS W/TWL LRG LVL3 (GOWN DISPOSABLE) ×2 IMPLANT
GOWN STRL REUS W/TWL XL LVL3 (GOWN DISPOSABLE) IMPLANT
HANDPIECE INTERPULSE COAX TIP (DISPOSABLE) ×1
IMMOBILIZER KNEE 20 (SOFTGOODS) ×2
IMMOBILIZER KNEE 20 THIGH 36 (SOFTGOODS) ×1 IMPLANT
KIT BASIN OR (CUSTOM PROCEDURE TRAY) ×2 IMPLANT
MANIFOLD NEPTUNE II (INSTRUMENTS) ×2 IMPLANT
NDL SAFETY ECLIPSE 18X1.5 (NEEDLE) ×2 IMPLANT
NEEDLE HYPO 18GX1.5 SHARP (NEEDLE) ×2
NS IRRIG 1000ML POUR BTL (IV SOLUTION) ×2 IMPLANT
PACK TOTAL JOINT (CUSTOM PROCEDURE TRAY) ×2 IMPLANT
PADDING CAST COTTON 6X4 STRL (CAST SUPPLIES) ×2 IMPLANT
POSITIONER SURGICAL ARM (MISCELLANEOUS) ×2 IMPLANT
SET HNDPC FAN SPRY TIP SCT (DISPOSABLE) ×1 IMPLANT
SPONGE GAUZE 4X4 12PLY (GAUZE/BANDAGES/DRESSINGS) ×2 IMPLANT
STRIP CLOSURE SKIN 1/2X4 (GAUZE/BANDAGES/DRESSINGS) ×4 IMPLANT
SUCTION FRAZIER 12FR DISP (SUCTIONS) ×2 IMPLANT
SUT MNCRL AB 4-0 PS2 18 (SUTURE) ×2 IMPLANT
SUT VIC AB 2-0 CT1 27 (SUTURE) ×3
SUT VIC AB 2-0 CT1 TAPERPNT 27 (SUTURE) ×3 IMPLANT
SUT VLOC 180 0 24IN GS25 (SUTURE) ×2 IMPLANT
SYRINGE 20CC LL (MISCELLANEOUS) ×2 IMPLANT
SYRINGE 60CC LL (MISCELLANEOUS) ×2 IMPLANT
TOWEL OR 17X26 10 PK STRL BLUE (TOWEL DISPOSABLE) ×2 IMPLANT
TOWEL OR NON WOVEN STRL DISP B (DISPOSABLE) IMPLANT
TRAY FOLEY CATH 14FRSI W/METER (CATHETERS) ×2 IMPLANT
WATER STERILE IRR 1500ML POUR (IV SOLUTION) ×2 IMPLANT
WRAP KNEE MAXI GEL POST OP (GAUZE/BANDAGES/DRESSINGS) ×2 IMPLANT

## 2013-10-15 NOTE — Progress Notes (Signed)
Clinical Social Work Department CLINICAL SOCIAL WORK PLACEMENT NOTE 10/15/2013  Patient:  Natasha Fletcher, Natasha Fletcher  Account Number:  [id number] Admit date:  10/15/2013  Clinical Social Worker:  Werner Lean, LCSW  Date/time:  10/15/2013 03:37 PM  Clinical Social Work is seeking post-discharge placement for this patient at the following level of care:   SKILLED NURSING   (*CSW will update this form in Epic as items are completed)     Patient/family provided with [facility] Department of Clinical Social Work's list of facilities offering this level of care within the geographic area requested by the patient (or if unable, by the patient's family).  10/15/2013  Patient/family informed of their freedom to choose among providers that offer the needed level of care, that participate in Medicare, Medicaid or managed care program needed by the patient, have an available bed and are willing to accept the patient.    Patient/family informed of [facility]' ownership interest in [facility], as well as of the fact that they are under no obligation to receive care at this facility.  PASARR submitted to EDS on 10/15/2013 PASARR number received on 10/15/2013  FL2 transmitted to all facilities in geographic area requested by pt/family on  10/15/2013 FL2 transmitted to all facilities within larger geographic area on   Patient informed that his/her managed care company has contracts with or will negotiate with  certain facilities, including the following:     Patient/family informed of bed offers received:   Patient chooses bed at  Physician recommends and patient chooses bed at    Patient to be transferred to  on   Patient to be transferred to facility by  Patient and family notified of transfer on  Name of family member notified:    The following physician request were entered in Epic:   Additional Comments:  Werner Lean LCSW [phone]

## 2013-10-15 NOTE — Progress Notes (Addendum)
Clinical Social Work Department BRIEF PSYCHOSOCIAL ASSESSMENT 10/15/2013  Patient:  Natasha Fletcher, Natasha Fletcher     Account Number:  [id number]     Admit date:  10/15/2013  Clinical Social Worker:  Lacie Scotts  Date/Time:  10/15/2013 03:26 PM  Referred by:  Physician  Date Referred:  10/15/2013 Referred for  SNF Placement   Other Referral:   Interview type:  Patient Other interview type:    PSYCHOSOCIAL DATA Living Status:  SIBLING Admitted from facility:   Level of care:   Primary support name:  Davis Gourd Primary support relationship to patient:  SIBLING Degree of support available:   supportive    CURRENT CONCERNS Current Concerns  Post-Acute Placement   Other Concerns:    SOCIAL WORK ASSESSMENT / PLAN Pt is an 80 yr old female living at home with her sister prior to hospitalization. CSW met with pt to assist with d/c planning. This is a planned admission. Pt states she will need ST Rehab following hospital d/c. SNF search has been initiated and bed offers will be provided as received. Pt has requested her sister be contacted and involved in d/c planning. CSW has left a message for pt's sister to contact  CSW. Pt has [employer]. CSW will request authorization once PT recommendations are available.   Assessment/plan status:  Psychosocial Support/Ongoing Assessment of Needs Other assessment/ plan:   Information/referral to community resources:   Insurance coverage for SNF and ambulance transport reviewed.    PATIENT'S/FAMILY'S RESPONSE TO PLAN OF CARE: Pt just arrived from surgery and complains of being sore. Nsg is aware and is assisting. Pt has been to [facility] in the past and states she would like to try a different facility. CSW reassured pt that all SNF options will be shared with her and family. CSW will visit again tomorrow to assist with d/c planning.   Werner Lean LCSW [phone]

## 2013-10-15 NOTE — Op Note (Signed)
Pre-operative diagnosis- Osteoarthritis  Left knee(s)  Post-operative diagnosis- Osteoarthritis Left knee(s)  Procedure-  Left  Total Knee Arthroplasty  Surgeon- Dione Plover. Jalia Zuniga, MD  Assistant- Arlee Muslim, PA-C   Anesthesia-  Spinal  EBL-* No blood loss amount entered *   Drains Hemovac  Tourniquet time-  Total Tourniquet Time Documented: Thigh (Left) - 32 minutes Total: Thigh (Left) - 32 minutes     Complications- None  Condition-PACU - hemodynamically stable.   Brief Clinical Note  Natasha Fletcher is a 80 y.o. year old female with end stage OA of her left knee with progressively worsening pain and dysfunction. She has constant pain, with activity and at rest and significant functional deficits with difficulties even with ADLs. She has had extensive non-op management including analgesics, injections of cortisone and viscosupplements, and home exercise program, but remains in significant pain with significant dysfunction. Radiographs show bone on bone arthritis patellofemoral and lateral. She presents now for left Total Knee Arthroplasty.    Procedure in detail---   The patient is brought into the operating room and positioned supine on the operating table. After successful administration of  Spinal,   a tourniquet is placed high on the  Left thigh(s) and the lower extremity is prepped and draped in the usual sterile fashion. Time out is performed by the operating team and then the  Left lower extremity is wrapped in Esmarch, knee flexed and the tourniquet inflated to 300 mmHg.       A midline incision is made with a ten blade through the subcutaneous tissue to the level of the extensor mechanism. A fresh blade is used to make a medial parapatellar arthrotomy. Soft tissue over the proximal medial tibia is subperiosteally elevated to the joint line with a knife and into the semimembranosus bursa with a Cobb elevator. Soft tissue over the proximal lateral tibia is elevated with  attention being paid to avoiding the patellar tendon on the tibial tubercle. The patella is everted, knee flexed 90 degrees and the ACL and PCL are removed. Findings are severe erosion of the patellofemoral joint as well as bone on bone changes laterally and massive osteophyte formation.        The drill is used to create a starting hole in the distal femur and the canal is thoroughly irrigated with sterile saline to remove the fatty contents. The 5 degree Left  valgus alignment guide is placed into the femoral canal and the distal femoral cutting block is pinned to remove 10 mm off the distal femur. Resection is made with an oscillating saw.      The tibia is subluxed forward and the menisci are removed. The extramedullary alignment guide is placed referencing proximally at the medial aspect of the tibial tubercle and distally along the second metatarsal axis and tibial crest. The block is pinned to remove 82mm off the more deficient lateral  side. Resection is made with an oscillating saw. Size 3is the most appropriate size for the tibia and the proximal tibia is prepared with the modular drill and keel punch for that size.      The femoral sizing guide is placed and size 3 is most appropriate. Rotation is marked off the epicondylar axis and confirmed by creating a rectangular flexion gap at 90 degrees. The size 3 cutting block is pinned in this rotation and the anterior, posterior and chamfer cuts are made with the oscillating saw. The intercondylar block is then placed and that cut is made.  Trial size 3 tibial component, trial size 3 posterior stabilized femur and a 12.5  mm posterior stabilized rotating platform insert trial is placed. Full extension is achieved with excellent varus/valgus and anterior/posterior balance throughout full range of motion. The patella is everted and thickness measured to be 16  mm. Free hand resection is taken to 10 mm, a 35 template is placed, lug holes are drilled, trial  patella is placed, and it tracks normally. Osteophytes are removed off the posterior femur with the trial in place. All trials are removed and the cut bone surfaces prepared with pulsatile lavage. Cement is mixed and once ready for implantation, the size 3 tibial implant, size  3 posterior stabilized femoral component, and the size 35 patella are cemented in place and the patella is held with the clamp. The trial insert is placed and the knee held in full extension. The Exparel (20 ml mixed with 30 ml saline) and .25% Bupivicaine, are injected into the extensor mechanism, posterior capsule, medial and lateral gutters and subcutaneous tissues.  All extruded cement is removed and once the cement is hard the permanent 12.5 mm posterior stabilized rotating platform insert is placed into the tibial tray.      The wound is copiously irrigated with saline solution and the extensor mechanism closed over a hemovac drain with #1 V-loc suture. The tourniquet is released for a total tourniquet time of 32  minutes. Flexion against gravity is 130 degrees and the patella tracks normally. Subcutaneous tissue is closed with 2.0 vicryl and subcuticular with running 4.0 Monocryl. The incision is cleaned and dried and steri-strips and a bulky sterile dressing are applied. The limb is placed into a knee immobilizer and the patient is awakened and transported to recovery in stable condition.      Please note that a surgical assistant was a medical necessity for this procedure in order to perform it in a safe and expeditious manner. Surgical assistant was necessary to retract the ligaments and vital neurovascular structures to prevent injury to them and also necessary for proper positioning of the limb to allow for anatomic placement of the prosthesis.   Dione Plover Nicasio Barlowe, MD    10/15/2013, 11:35 AM

## 2013-10-15 NOTE — Anesthesia Procedure Notes (Signed)
Spinal  Patient location during procedure: OR End time: 10/15/2013 10:36 AM Staffing CRNA/Resident: Noralyn Pick Performed by: resident/CRNA  Preanesthetic Checklist Completed: patient identified, site marked, surgical consent, pre-op evaluation, timeout performed, IV checked, risks and benefits discussed and monitors and equipment checked Spinal Block Patient position: sitting Prep: Betadine Patient monitoring: heart rate, continuous pulse ox and blood pressure Approach: midline Location: L3-4 Injection technique: single-shot Needle Needle type: Sprotte  Needle gauge: 24 G Needle length: 9 cm Assessment Sensory level: T6 Additional Notes Expiration date of kit checked and confirmed. Patient tolerated procedure well, without complications.

## 2013-10-15 NOTE — Transfer of Care (Signed)
Immediate Anesthesia Transfer of Care Note  Patient: Natasha Fletcher  Procedure(s) Performed: Procedure(s): LEFT TOTAL KNEE ARTHROPLASTY (Left)  Patient Location: PACU  Anesthesia Type:Regional  Level of Consciousness: awake, alert  and oriented  Airway & Oxygen Therapy: Patient Spontanous Breathing and Patient connected to face mask oxygen  Post-op Assessment: Report given to PACU RN and Post -op Vital signs reviewed and stable  Post vital signs: Reviewed and stable  Complications: No apparent anesthesia complications

## 2013-10-15 NOTE — Anesthesia Postprocedure Evaluation (Signed)
  Anesthesia Post-op Note  Patient: Natasha Fletcher  Procedure(s) Performed: Procedure(s) (LRB): LEFT TOTAL KNEE ARTHROPLASTY (Left)  Patient Location: PACU  Anesthesia Type: Spinal  Level of Consciousness: awake and alert   Airway and Oxygen Therapy: Patient Spontanous Breathing  Post-op Pain: mild  Post-op Assessment: Post-op Vital signs reviewed, Patient's Cardiovascular Status Stable, Respiratory Function Stable, Patent Airway and No signs of Nausea or vomiting  Last Vitals:  Filed Vitals:   10/15/13 1208  BP:   Pulse:   Temp: 36.3 C  Resp: 14    Post-op Vital Signs: stable   Complications: No apparent anesthesia complications

## 2013-10-15 NOTE — Progress Notes (Signed)
PT Cancellation Note  Patient Details Name: Alsace Dowd Ceci MRN: [id number] DOB: [birth year]   Cancelled Treatment:    Reason Eval/Treat Not Completed: Pain limiting ability to participate (will try tomorrow morning. Pain limiting ability to participate this evening)   Judythe Postema 10/15/2013, 5:10 PM

## 2013-10-15 NOTE — Anesthesia Preprocedure Evaluation (Signed)
Anesthesia Evaluation  Patient identified by MRN, date of birth, ID band Patient awake    Reviewed: Allergy & Precautions, H&P , NPO status , Patient's Chart, lab work & pertinent test results  Airway Mallampati: II TM Distance: >3 FB Neck ROM: Full    Dental no notable dental hx. (+) Edentulous Upper, Edentulous Lower   Pulmonary asthma , former smoker,  breath sounds clear to auscultation  Pulmonary exam normal       Cardiovascular hypertension, Pt. on medications Rhythm:Regular Rate:Normal     Neuro/Psych negative neurological ROS  negative psych ROS   GI/Hepatic negative GI ROS, Neg liver ROS,   Endo/Other  negative endocrine ROS  Renal/GU negative Renal ROS  negative genitourinary   Musculoskeletal negative musculoskeletal ROS (+)   Abdominal   Peds negative pediatric ROS (+)  Hematology negative hematology ROS (+)   Anesthesia Other Findings   Reproductive/Obstetrics negative OB ROS                           Anesthesia Physical Anesthesia Plan  ASA: II  Anesthesia Plan: Spinal   Post-op Pain Management:    Induction:   Airway Management Planned: Simple Face Mask  Additional Equipment:   Intra-op Plan:   Post-operative Plan:   Informed Consent: I have reviewed the patients History and Physical, chart, labs and discussed the procedure including the risks, benefits and alternatives for the proposed anesthesia with the patient or authorized representative who has indicated his/her understanding and acceptance.   Dental advisory given  Plan Discussed with: CRNA  Anesthesia Plan Comments:         Anesthesia Quick Evaluation

## 2013-10-15 NOTE — Interval H&P Note (Signed)
History and Physical Interval Note:  10/15/2013 8:28 AM  Natasha Fletcher  has presented today for surgery, with the diagnosis of OA LEFT KNEE  The various methods of treatment have been discussed with the patient and family. After consideration of risks, benefits and other options for treatment, the patient has consented to  Procedure(s): LEFT TOTAL KNEE ARTHROPLASTY (Left) as a surgical intervention .  The patient's history has been reviewed, patient examined, no change in status, stable for surgery.  I have reviewed the patient's chart and labs.  Questions were answered to the patient's satisfaction.     Gearlean Alf

## 2013-10-15 NOTE — Interval H&P Note (Signed)
History and Physical Interval Note:  10/15/2013 10:27 AM  Natasha Fletcher  has presented today for surgery, with the diagnosis of OA LEFT KNEE  The various methods of treatment have been discussed with the patient and family. After consideration of risks, benefits and other options for treatment, the patient has consented to  Procedure(s): LEFT TOTAL KNEE ARTHROPLASTY (Left) as a surgical intervention .  The patient's history has been reviewed, patient examined, no change in status, stable for surgery.  I have reviewed the patient's chart and labs.  Questions were answered to the patient's satisfaction.     Gearlean Alf

## 2013-10-16 LAB — CBC
HCT: 29.4 % — ABNORMAL LOW (ref 36.0–46.0)
Hemoglobin: 9.5 g/dL — ABNORMAL LOW (ref 12.0–15.0)
MCH: 27.1 pg (ref 26.0–34.0)
MCHC: 32.3 g/dL (ref 30.0–36.0)
MCV: 83.8 fL (ref 78.0–100.0)
Platelets: 205 10*3/uL (ref 150–400)
RBC: 3.51 MIL/uL — ABNORMAL LOW (ref 3.87–5.11)
RDW: 14.7 % (ref 11.5–15.5)
WBC: 12.1 10*3/uL — ABNORMAL HIGH (ref 4.0–10.5)

## 2013-10-16 LAB — BASIC METABOLIC PANEL
Anion gap: 12 (ref 5–15)
BUN: 14 mg/dL (ref 6–23)
CO2: 25 mEq/L (ref 19–32)
CREATININE: 0.81 mg/dL (ref 0.50–1.10)
Calcium: 9.2 mg/dL (ref 8.4–10.5)
Chloride: 102 mEq/L (ref 96–112)
GFR, EST AFRICAN AMERICAN: 75 mL/min — AB (ref >90)
GFR, EST NON AFRICAN AMERICAN: 64 mL/min — AB (ref >90)
Glucose, Bld: 164 mg/dL — ABNORMAL HIGH (ref 70–99)
Potassium: 4.3 mEq/L (ref 3.7–5.3)
Sodium: 139 mEq/L (ref 137–147)

## 2013-10-16 MED ORDER — LISINOPRIL 20 MG PO TABS
20.0000 mg | ORAL_TABLET | Freq: Every morning | ORAL | Status: DC
  Administered 2013-10-16 – 2013-10-20 (×5): 20 mg via ORAL
  Filled 2013-10-16 (×5): qty 1

## 2013-10-16 MED ORDER — POLYSACCHARIDE IRON COMPLEX 150 MG PO CAPS
150.0000 mg | ORAL_CAPSULE | Freq: Every day | ORAL | Status: DC
  Administered 2013-10-16 – 2013-10-19 (×4): 150 mg via ORAL
  Filled 2013-10-16 (×5): qty 1

## 2013-10-16 NOTE — Discharge Instructions (Addendum)
° °Dr. Frank Aluisio °Total Joint Specialist °[facility] °[address] °[place], [place] [address] °[phone] ° °TOTAL KNEE REPLACEMENT POSTOPERATIVE DIRECTIONS ° ° ° °Knee Rehabilitation, Guidelines Following Surgery  °Results after knee surgery are often greatly improved when you follow the exercise, range of motion and muscle strengthening exercises prescribed by your doctor. Safety measures are also important to protect the knee from further injury. Any time any of these exercises cause you to have increased pain or swelling in your knee joint, decrease the amount until you are comfortable again and slowly increase them. If you have problems or questions, call your caregiver or physical therapist for advice.  ° °HOME CARE INSTRUCTIONS  °Remove items at home which could result in a fall. This includes throw rugs or furniture in walking pathways.  °Continue medications as instructed at time of discharge. °You may have some home medications which will be placed on hold until you complete the course of blood thinner medication.  °You may start showering once you are discharged home but do not submerge the incision under water. Just pat the incision dry and apply a dry gauze dressing on daily. °Walk with walker as instructed.  °You may resume a sexual relationship in one month or when given the OK by  your doctor.  °· Use walker as long as suggested by your caregivers. °· Avoid periods of inactivity such as sitting longer than an hour when not asleep. This helps prevent blood clots.  °You may put full weight on your legs and walk as much as is comfortable.  °You may return to work once you are cleared by your doctor.  °Do not drive a car for 6 weeks or until released by you surgeon.  °· Do not drive while taking narcotics.  °Wear the elastic stockings for three weeks following surgery during the day but you may remove then at night. °Make sure you keep all of your appointments after your  operation with all of your doctors and caregivers. You should call the office at the above phone number and make an appointment for approximately two weeks after the date of your surgery. °Change the dressing daily and reapply a dry dressing each time. °Please pick up a stool softener and laxative for home use as long as you are requiring pain medications. °· Continue to use ice on the knee for pain and swelling from surgery. You may notice swelling that will progress down to the foot and ankle.  This is normal after surgery.  Elevate the leg when you are not up walking on it.   °It is important for you to complete the blood thinner medication as prescribed by your doctor. °· Continue to use the breathing machine which will help keep your temperature down.  It is common for your temperature to cycle up and down following surgery, especially at night when you are not up moving around and exerting yourself.  The breathing machine keeps your lungs expanded and your temperature down. ° °RANGE OF MOTION AND STRENGTHENING EXERCISES  °Rehabilitation of the knee is important following a knee injury or an operation. After just a few days of immobilization, the muscles of the thigh which control the knee become weakened and shrink (atrophy). Knee exercises are designed to build up the tone and strength of the thigh muscles and to improve knee motion. Often times heat used for twenty to thirty minutes before working out will loosen up your tissues and help with improving the   range of motion but do not use heat for the first two weeks following surgery. These exercises can be done on a training (exercise) mat, on the floor, on a table or on a bed. Use what ever works the best and is most comfortable for you Knee exercises include:  Leg Lifts - While your knee is still immobilized in a splint or cast, you can do straight leg raises. Lift the leg to 60 degrees, hold for 3 sec, and slowly lower the leg. Repeat 10-20 times 2-3  times daily. Perform this exercise against resistance later as your knee gets better.  Quad and Hamstring Sets - Tighten up the muscle on the front of the thigh (Quad) and hold for 5-10 sec. Repeat this 10-20 times hourly. Hamstring sets are done by pushing the foot backward against an object and holding for 5-10 sec. Repeat as with quad sets.  A rehabilitation program following serious knee injuries can speed recovery and prevent re-injury in the future due to weakened muscles. Contact your doctor or a physical therapist for more information on knee rehabilitation.   SKILLED REHAB INSTRUCTIONS: If the patient is transferred to a skilled rehab facility following release from the hospital, a list of the current medications will be sent to the facility for the patient to continue.  When discharged from the skilled rehab facility, please have the facility set up the patient's [place] prior to being released. Also, the skilled facility will be responsible for providing the patient with their medications at time of release from the facility to include their pain medication, the muscle relaxants, and their blood thinner medication. If the patient is still at the rehab facility at time of the two week follow up appointment, the skilled rehab facility will also need to assist the patient in arranging follow up appointment in our office and any transportation needs.  MAKE SURE YOU:  Understand these instructions.  Will watch your condition.  Will get help right away if you are not doing well or get worse.    Pick up stool softner and laxative for home. Do not submerge incision under water. May shower. Continue to use ice for pain and swelling from surgery.  Take Xarelto for two and a half more weeks, then discontinue Xarelto. Once the patient has completed the blood thinner regimen, then take a Baby 81 mg Aspirin daily for three more weeks.  When discharged from the skilled rehab  facility, please have the facility set up the patient's [place] prior to being released.  Also provide the patient with their medications at time of release from the facility to include their pain medication, the muscle relaxants, and their blood thinner medication.  If the patient is still at the rehab facility at time of follow up appointment, please also assist the patient in arranging follow up appointment in our office and any transportation needs.      Information on my medicine - XARELTO (Rivaroxaban)  This medication education was reviewed with me or my healthcare representative as part of my discharge preparation.  The pharmacist that spoke with me during my hospital stay was:  Julio Sicks, [facility]  Why was Xarelto prescribed for you? Xarelto was prescribed for you to reduce the risk of blood clots forming after orthopedic surgery. The medical term for these abnormal blood clots is venous thromboembolism (VTE).  What do you need to know about xarelto ? Take your Xarelto ONCE DAILY at the same  time every day. You may take it either with or without food.  If you have difficulty swallowing the tablet whole, you may crush it and mix in applesauce just prior to taking your dose.  Take Xarelto exactly as prescribed by your doctor and DO NOT stop taking Xarelto without talking to the doctor who prescribed the medication.  Stopping without other VTE prevention medication to take the place of Xarelto may increase your risk of developing a clot.  After discharge, you should have regular check-up appointments with your healthcare provider that is prescribing your Xarelto.    What do you do if you miss a dose? If you miss a dose, take it as soon as you remember on the same day then continue your regularly scheduled once daily regimen the next day. Do not take two doses of Xarelto on the same day.   Important Safety Information A possible side effect of Xarelto  is bleeding. You should call your healthcare provider right away if you experience any of the following:   Bleeding from an injury or your nose that does not stop.   Unusual colored urine (red or dark brown) or unusual colored stools (red or black).   Unusual bruising for unknown reasons.   A serious fall or if you hit your head (even if there is no bleeding).  Some medicines may interact with Xarelto and might increase your risk of bleeding while on Xarelto. To help avoid this, consult your healthcare provider or pharmacist prior to using any new prescription or non-prescription medications, including herbals, vitamins, non-steroidal anti-inflammatory drugs (NSAIDs) and supplements.  This website has more information on Xarelto: [email or web address]

## 2013-10-16 NOTE — Evaluation (Addendum)
Physical Therapy Evaluation Patient Details Name: Natasha Fletcher MRN: [id number] DOB: [birth year] Today's Date: 10/16/2013   History of Present Illness  s/p L TKA   Clinical Impression  Pt will benefit from PT to address deficits below; plan is for SNF;    Follow Up Recommendations SNF    Equipment Recommendations       Recommendations for Other Services       Precautions / Restrictions Precautions Precautions: Fall Required Braces or Orthoses: Knee Immobilizer - Left Knee Immobilizer - Left: Discontinue once straight leg raise with < 10 degree lag Restrictions Other Position/Activity Restrictions: WBAT      Mobility  Bed Mobility Overal bed mobility: Needs Assistance Bed Mobility: Supine to Sit     Supine to sit: Min assist     General bed mobility comments: incr time, cues for technique  Transfers Overall transfer level: Needs assistance Equipment used: Rolling walker (2 wheeled) Transfers: Sit to/from Stand Sit to Stand: Mod assist         General transfer comment: cues for hand placement and safety  Ambulation/Gait Ambulation/Gait assistance: Min assist Ambulation Distance (Feet): 15 Feet Assistive device: Rolling walker (2 wheeled) Gait Pattern/deviations: Step-to pattern;Trunk flexed;Decreased step length - left;Decreased step length - right     General Gait Details: cues for sequence, RW position and posture  Stairs            Wheelchair Mobility    Modified Rankin (Stroke Patients Only)       Balance                                             Pertinent Vitals/Pain sats and HR WNL    Home Living Family/patient expects to be discharged to:: Skilled nursing facility                      Prior Function Level of Independence: Independent with assistive device(s)               Hand Dominance        Extremity/Trunk Assessment   Upper Extremity Assessment: Defer to OT evaluation            Lower Extremity Assessment: LLE deficits/detail   LLE Deficits / Details: hip and knee 2+/5 grossly; ankle WFL; limited testing due to pain     Communication   Communication: No difficulties  Cognition Arousal/Alertness: Awake/alert Behavior During Therapy: WFL for tasks assessed/performed Overall Cognitive Status: Within Functional Limits for tasks assessed                      General Comments      Exercises Total Joint Exercises Ankle Circles/Pumps: AROM;Both;10 reps Quad Sets: AROM;10 reps;Both Heel Slides: AROM;Both;10 reps      Assessment/Plan    PT Assessment Patient needs continued PT services  PT Diagnosis Difficulty walking   PT Problem List Decreased strength;Decreased range of motion;Decreased activity tolerance;Decreased balance;Decreased mobility;Decreased knowledge of use of DME;Decreased knowledge of precautions  PT Treatment Interventions DME instruction;Gait training;Functional mobility training;Therapeutic exercise;Therapeutic activities;Patient/family education   PT Goals (Current goals can be found in the Care Plan section) Acute Rehab PT Goals Patient Stated Goal: to go to rehab PT Goal Formulation: With patient Time For Goal Achievement: 10/23/13 Potential to Achieve Goals: Good    Frequency 7X/week   Barriers to discharge  Co-evaluation               End of Session Equipment Utilized During Treatment: Gait belt Activity Tolerance: Patient tolerated treatment well Patient left: in chair;with call bell/phone within reach Nurse Communication: Mobility status         Time: 1157-2620 PT Time Calculation (min): 29 min   Charges:   PT Evaluation $Initial PT Evaluation Tier I: 1 Procedure PT Treatments $Gait Training: 8-22 mins $Therapeutic Activity: 8-22 mins   PT G Codes:          Natasha Fletcher November 09, 2013, 1:30 PM

## 2013-10-16 NOTE — Progress Notes (Signed)
   Subjective: 1 Day Post-Op Procedure(s) (LRB): LEFT TOTAL KNEE ARTHROPLASTY (Left) Patient reports pain as mild.   Patient seen in rounds with Dr. Wynelle Link.  She states that she is doing okay this morning. Patient is well, and has had no acute complaints or problems We will start therapy today.  Plan is to go Skilled nursing facility after hospital stay.  Objective: Vital signs in last 24 hours: Temp:  [97.4 F (36.3 C)-98.7 F (37.1 C)] 97.9 F (36.6 C) (07/14 4982) Pulse Rate:  [63-80] 73 (07/14 0633) Resp:  [14-18] 18 (07/14 0633) BP: (139-193)/(71-88) 165/88 mmHg (07/14 0633) SpO2:  [90 %-100 %] 97 % (07/14 0633) Weight:  [68.947 kg (152 lb)] 68.947 kg (152 lb) (07/13 1259)  Intake/Output from previous day:  Intake/Output Summary (Last 24 hours) at 10/16/13 0752 Last data filed at 10/16/13 0646  Gross per 24 hour  Intake 4037.5 ml  Output   5410 ml  Net -1372.5 ml    Intake/Output this shift: UOP 2025  Labs:  Recent Labs  10/16/13 0456  HGB 9.5*    Recent Labs  10/16/13 0456  WBC 12.1*  RBC 3.51*  HCT 29.4*  PLT 205    Recent Labs  10/16/13 0456  NA 139  K 4.3  CL 102  CO2 25  BUN 14  CREATININE 0.81  GLUCOSE 164*  CALCIUM 9.2   No results found for this basename: LABPT, INR,  in the last 72 hours  EXAM General - Patient is Alert, Appropriate and Oriented Extremity - Neurovascular intact Sensation intact distally Dorsiflexion/Plantar flexion intact Dressing - dressing C/D/I Motor Function - intact, moving foot and toes well on exam.  Hemovac pulled without difficulty.  Past Medical History  Diagnosis Date  . Hypertension   . Asthma   . Glaucoma   . Incontinence   . Wears dentures   . Wears glasses   . Thyroid disease     "something years ago"  . GERD (gastroesophageal reflux disease)   . Cancer     rt breast  . Edema leg   . Fast breathing     "because of pill"  . Pneumonia     h/o younger  . Arthritis     "all over"    . S/P total knee arthroplasty   . Glaucoma   . Swelling     bilateral feet/ legs, more left.    Assessment/Plan: 1 Day Post-Op Procedure(s) (LRB): LEFT TOTAL KNEE ARTHROPLASTY (Left) Active Problems:   OA (osteoarthritis) of knee  Estimated body mass index is 24.55 kg/(m^2) as calculated from the following:   Height as of this encounter: 5\' 6"  (1.676 m).   Weight as of this encounter: 68.947 kg (152 lb). Advance diet Up with therapy Discharge to SNF  DVT Prophylaxis - Xarelto Weight-Bearing as tolerated to left leg D/C O2 and Pulse OX and try on Room Air Tapering Prednisone dosing postop  Pressure elevated - will resume the Lisinopril   Arlee Muslim, PA-C Orthopaedic Surgery 10/16/2013, 7:52 AM

## 2013-10-16 NOTE — Progress Notes (Signed)
10/16/13 1500  PT Visit Information  Last PT Received On 10/16/13  Reason Eval/Treat Not Completed Pain limiting ability to participate (in ther ex; pt very fatigued per OT as well)

## 2013-10-16 NOTE — Evaluation (Signed)
Occupational Therapy Evaluation Patient Details Name: Natasha Fletcher MRN: [id number] DOB: 06/02/1925 Today's Date: 10/16/2013    History of Present Illness s/p L TKA    Clinical Impression   Pt limited by pain during OT eval. Assisted to stand at EOB only for periarea cleanup and then back to supine due to pain this pm. Will benefit from continued OT services to improve ADL independence for next venue of care.     Follow Up Recommendations  SNF;Supervision/Assistance - 24 hour    Equipment Recommendations  3 in 1 bedside comode    Recommendations for Other Services       Precautions / Restrictions Precautions Precautions: Fall Required Braces or Orthoses: Knee Immobilizer - Left Knee Immobilizer - Left: Discontinue once straight leg raise with < 10 degree lag Restrictions Weight Bearing Restrictions: No Other Position/Activity Restrictions: WBAT      Mobility Bed Mobility Overal bed mobility: Needs Assistance Bed Mobility: Supine to Sit;Sit to Supine     Supine to sit: Mod assist Sit to supine: Mod assist   General bed mobility comments: increased time, pt with increased pain and needing assist to bring trunk to upright and with LEs over to EOB. Also needed assist to bring LEs back up onto bed and reposition to midline.  Transfers Overall transfer level: Needs assistance Equipment used: Rolling walker (2 wheeled) Transfers: Sit to/from Stand Sit to Stand: Mod assist Stand pivot transfers: Mod assist       General transfer comment: cues for hand placement and safety    Balance                                        ADL Overall ADL's : Needs assistance/impaired Eating/Feeding: Independent;Bed level   Grooming: Wash/dry hands;Set up;Sitting   Upper Body Bathing: Supervision/ safety;Set up;Sitting   Lower Body Bathing: Maximal assistance;Sit to/from stand   Upper Body Dressing : Sitting;Minimal assistance   Lower Body Dressing: Total  assistance;Sit to/from stand Lower Body Dressing Details (indicate cue type and reason): due to pain Toilet Transfer: Moderate assistance;RW Toilet Transfer Details (indicate cue type and reason): sit to stand only Toileting- Water quality scientist and Hygiene: Total assistance;Sit to/from stand         General ADL Comments: Pt with 9/10 pain when OT arrived and nursing tech in to assist pt as she had urinated on bedpad. Pt agreeable to stand at EOB to have linens changed and her periareas washed. Assisted back to supine and pt stating she still felt the need to urinate so assisted pt onto bedpan and pt instructed to call for assist when ready to get off bedpan. Pt is motivated but limited by pain this session. She was premedicated for session.      Vision                     Perception     Praxis      Pertinent Vitals/Pain 9/10 L knee; reposition     Hand Dominance     Extremity/Trunk Assessment Upper Extremity Assessment Upper Extremity Assessment: Overall WFL for tasks assessed          Communication Communication Communication: No difficulties   Cognition Arousal/Alertness: Awake/alert Behavior During Therapy: WFL for tasks assessed/performed Overall Cognitive Status: Within Functional Limits for tasks assessed  General Comments       Exercises       Shoulder Instructions      Home Living Family/patient expects to be discharged to:: Skilled nursing facility                                        Prior Functioning/Environment Level of Independence: Independent with assistive device(s)             OT Diagnosis: Generalized weakness   OT Problem List: Decreased strength;Decreased knowledge of use of DME or AE   OT Treatment/Interventions: Self-care/ADL training;Patient/family education;Therapeutic activities;DME and/or AE instruction    OT Goals(Current goals can be found in the care plan section)  Acute Rehab OT Goals Patient Stated Goal: to go to rehab OT Goal Formulation: With patient Time For Goal Achievement: 10/23/13 Potential to Achieve Goals: Good  OT Frequency: Min 2X/week   Barriers to D/C:            Co-evaluation              End of Session Equipment Utilized During Treatment: Gait belt;Rolling walker;Left knee immobilizer  Activity Tolerance: Patient limited by pain Patient left:     Time: 1440-1505 OT Time Calculation (min): 25 min Charges:  OT General Charges $OT Visit: 1 Procedure OT Evaluation $Initial OT Evaluation Tier I: 1 Procedure OT Treatments $Therapeutic Activity: 8-22 mins G-Codes:    Jules Schick [phone] 10/16/2013, 3:42 PM

## 2013-10-16 NOTE — Progress Notes (Signed)
10/16/13 1400  PT Visit Information  Last PT Received On 10/16/13  Assistance Needed +1  History of Present Illness s/p L TKA   PT Time Calculation  PT Start Time 1225  PT Stop Time 1235  PT Time Calculation (min) 10 min  Subjective Data  Patient Stated Goal to go to rehab  Precautions  Precautions Fall  Required Braces or Orthoses Knee Immobilizer - Left  Knee Immobilizer - Left Discontinue once straight leg raise with < 10 degree lag  Restrictions  Other Position/Activity Restrictions WBAT  Cognition  Arousal/Alertness Awake/alert  Behavior During Therapy WFL for tasks assessed/performed  Overall Cognitive Status Within Functional Limits for tasks assessed  Transfers  Equipment used Rolling walker (2 wheeled)  Transfers Sit to/from [facility]  Sit to Stand Mod assist  Stand pivot transfers Mod assist  General transfer comment cues for hand placement and safety  Balance  Overall balance assessment Needs assistance  Standing balance support During functional activity;Bilateral upper extremity supported  Standing balance-Leahy Scale Poor  General Comments  General comments (skin integrity, edema, etc.) pt observed tryin gto get out of chair due to urinary urgency, assisted with standing and balance while NT performed peri-care  PT - End of Session  Equipment Utilized During Treatment Gait belt  Activity Tolerance Patient tolerated treatment well  Patient left in chair;with call bell/phone within reach  Nurse Communication Mobility status  PT - Assessment/Plan  PT Plan Current plan remains appropriate  PT Frequency 7X/week  Follow Up Recommendations SNF  PT equipment None recommended by PT  PT Goal Progression  Progress towards PT goals Progressing toward goals  Acute Rehab PT Goals  PT Goal Formulation With patient  Time For Goal Achievement 10/23/13  Potential to Achieve Goals Good

## 2013-10-17 LAB — CBC
HCT: 27.9 % — ABNORMAL LOW (ref 36.0–46.0)
HEMOGLOBIN: 9 g/dL — AB (ref 12.0–15.0)
MCH: 26.3 pg (ref 26.0–34.0)
MCHC: 32.3 g/dL (ref 30.0–36.0)
MCV: 81.6 fL (ref 78.0–100.0)
PLATELETS: 148 10*3/uL — AB (ref 150–400)
RBC: 3.42 MIL/uL — ABNORMAL LOW (ref 3.87–5.11)
RDW: 14.8 % (ref 11.5–15.5)
WBC: 11.6 10*3/uL — ABNORMAL HIGH (ref 4.0–10.5)

## 2013-10-17 LAB — BASIC METABOLIC PANEL
Anion gap: 13 (ref 5–15)
BUN: 14 mg/dL (ref 6–23)
CALCIUM: 9.3 mg/dL (ref 8.4–10.5)
CO2: 26 mEq/L (ref 19–32)
Chloride: 99 mEq/L (ref 96–112)
Creatinine, Ser: 0.82 mg/dL (ref 0.50–1.10)
GFR calc Af Amer: 74 mL/min — ABNORMAL LOW (ref >90)
GFR, EST NON AFRICAN AMERICAN: 63 mL/min — AB (ref >90)
Glucose, Bld: 120 mg/dL — ABNORMAL HIGH (ref 70–99)
Potassium: 4.2 mEq/L (ref 3.7–5.3)
SODIUM: 138 meq/L (ref 137–147)

## 2013-10-17 MED ORDER — CYCLOBENZAPRINE HCL 10 MG PO TABS
5.0000 mg | ORAL_TABLET | Freq: Three times a day (TID) | ORAL | Status: DC | PRN
  Administered 2013-10-17 – 2013-10-20 (×5): 5 mg via ORAL
  Filled 2013-10-17 (×5): qty 1

## 2013-10-17 MED ORDER — HYDROMORPHONE HCL PF 1 MG/ML IJ SOLN
0.5000 mg | INTRAMUSCULAR | Status: DC | PRN
  Administered 2013-10-17: 0.5 mg via INTRAVENOUS

## 2013-10-17 NOTE — Progress Notes (Signed)
Physical Therapy Treatment Patient Details Name: Natasha Fletcher MRN: [id number] DOB: 11-Oct-1925 Today's Date: 10/17/2013    History of Present Illness s/p L TKA     PT Comments    POD # 2 pm session.  Pt found to be incont of urine in recliner.  Assisted to [facility] + 2 assist for safety then back to bed.  Pt progressing slowly and will need ST SNF Rehab.  Follow Up Recommendations  SNF     Equipment Recommendations       Recommendations for Other Services       Precautions / Restrictions Precautions Precautions: Fall Required Braces or Orthoses: Knee Immobilizer - Left Knee Immobilizer - Left: Discontinue once straight leg raise with < 10 degree lag Restrictions Weight Bearing Restrictions: No Other Position/Activity Restrictions: WBAT    Mobility  Bed Mobility Overal bed mobility: Needs Assistance Bed Mobility: Sit to Supine     Supine to sit: Mod assist Sit to supine: +2 for physical assistance;Max assist   General bed mobility comments: Assisted back to bed + 2 assist and increased time  Transfers Overall transfer level: Needs assistance Equipment used: Rolling walker (2 wheeled) Transfers: Sit to/from Stand Sit to Stand: +2 physical assistance;Mod assist Stand pivot transfers: +2 physical assistance;Mod assist       General transfer comment: 50% VC's on proper tech and hand placement as well as increased time to complete turns.  Assisted out of recliner to [facility] then back to bed + 2 assist for safety.  Ambulation/Gait Ambulation/Gait assistance: +2 physical assistance;Mod assist Ambulation Distance (Feet): 1 Feet (backward gait to bed) Assistive device: Rolling walker (2 wheeled) Gait Pattern/deviations: Step-to pattern Gait velocity: decreased   General Gait Details: pt was only able to take a few steps backward from [facility] to bed.     Stairs            Wheelchair Mobility    Modified Rankin (Stroke Patients Only)       Balance                                    Cognition                            Exercises      General Comments        Pertinent Vitals/Pain C/o 7/10 pain MAX fatigue     Home Living                      Prior Function            PT Goals (current goals can now be found in the care plan section) Progress towards PT goals: Progressing toward goals    Frequency  7X/week    PT Plan      Co-evaluation             End of Session Equipment Utilized During Treatment: Gait belt Activity Tolerance: Patient limited by pain;Other (comment) (limited by nausea) Patient left: in chair;with call bell/phone within reach     Time: 1456-1520 PT Time Calculation (min): 24 min  Charges:  $Gait Training: 8-22 mins $Therapeutic Activity: 8-22 mins                    G Codes:      Rica Koyanagi  PTA [facility]  Acute  Rehab Pager      [phone]

## 2013-10-17 NOTE — Progress Notes (Signed)
Physical Therapy Treatment Patient Details Name: SHALISA MCQUADE MRN: [id number] DOB: 16-Sep-1925 Today's Date: 10/17/2013    History of Present Illness s/p L TKA     PT Comments    POD # 2 am session.  Applied KI and instructed pt on use for amb.  Assisted pt OOB to [facility].  Assisted with hygiene then amb decreased distance today due to increased c/o nausea and overall not feeling well.  Pt reqired + 2 assist for safety today.  Pt demon poor standing posture and required increased assist with stand to sit to advance L LE prior to sit.    Follow Up Recommendations  SNF     Equipment Recommendations       Recommendations for Other Services       Precautions / Restrictions Precautions Precautions: Fall Required Braces or Orthoses: Knee Immobilizer - Left Knee Immobilizer - Left: Discontinue once straight leg raise with < 10 degree lag Restrictions Weight Bearing Restrictions: No Other Position/Activity Restrictions: WBAT    Mobility  Bed Mobility Overal bed mobility: Needs Assistance Bed Mobility: Supine to Sit     Supine to sit: Mod assist     General bed mobility comments: 50% VC's on proper tech and increased/increased time due to pain level and mild anxiety.  Transfers Overall transfer level: Needs assistance Equipment used: Rolling walker (2 wheeled) Transfers: Sit to/from Stand Sit to Stand: +2 physical assistance;Mod assist Stand pivot transfers: +2 physical assistance;Mod assist       General transfer comment: 50% VC's on proper tech and hand placement as well as increased time to complete turns.  Ambulation/Gait Ambulation/Gait assistance: +2 physical assistance;Mod assist Ambulation Distance (Feet): 5 Feet Assistive device: Rolling walker (2 wheeled) Gait Pattern/deviations: Step-to pattern Gait velocity: decreased   General Gait Details: decreased amb distance today due to increased c/o nausea and feeling "bad".     Stairs             Wheelchair Mobility    Modified Rankin (Stroke Patients Only)       Balance                                    Cognition                            Exercises   Total Knee Replacement TE's 10 reps B LE ankle pumps 10 reps towel squeezes 10 reps knee presses 5 reps heel slides  5 reps SAQ's 5 reps SLR's 15reps ABD Followed by ICE Pt unable to tolerate 10 reps of all TE's    General Comments        Pertinent Vitals/Pain C/o 8/10 pain despite premedicated and MAX c/o feeling "bad"    Home Living                      Prior Function            PT Goals (current goals can now be found in the care plan section) Progress towards PT goals: Progressing toward goals    Frequency  7X/week    PT Plan      Co-evaluation             End of Session Equipment Utilized During Treatment: Gait belt Activity Tolerance: Patient limited by pain;Other (comment) (limited by nausea) Patient left: in chair;with call bell/phone  within reach     Time: 1140-1209 PT Time Calculation (min): 29 min  Charges:  $Gait Training: 8-22 mins $Therapeutic Activity: 8-22 mins                    G Codes:      Rica Koyanagi  PTA [facility]  Acute  Rehab Pager      [phone]

## 2013-10-18 DIAGNOSIS — J45909 Unspecified asthma, uncomplicated: Secondary | ICD-10-CM | POA: Diagnosis present

## 2013-10-18 DIAGNOSIS — R32 Unspecified urinary incontinence: Secondary | ICD-10-CM | POA: Diagnosis present

## 2013-10-18 DIAGNOSIS — R339 Retention of urine, unspecified: Secondary | ICD-10-CM | POA: Diagnosis not present

## 2013-10-18 LAB — CBC
HCT: 25.9 % — ABNORMAL LOW (ref 36.0–46.0)
Hemoglobin: 8.4 g/dL — ABNORMAL LOW (ref 12.0–15.0)
MCH: 26.4 pg (ref 26.0–34.0)
MCHC: 32.4 g/dL (ref 30.0–36.0)
MCV: 81.4 fL (ref 78.0–100.0)
PLATELETS: 189 10*3/uL (ref 150–400)
RBC: 3.18 MIL/uL — ABNORMAL LOW (ref 3.87–5.11)
RDW: 14.6 % (ref 11.5–15.5)
WBC: 9 10*3/uL (ref 4.0–10.5)

## 2013-10-18 LAB — PREPARE RBC (CROSSMATCH)

## 2013-10-18 MED ORDER — ONDANSETRON HCL 4 MG PO TABS: 4.0000 mg | ORAL_TABLET | Freq: Four times a day (QID) | ORAL | Status: DC | PRN

## 2013-10-18 MED ORDER — ACETAMINOPHEN 325 MG PO TABS: 650.0000 mg | ORAL_TABLET | Freq: Four times a day (QID) | ORAL | Status: DC | PRN

## 2013-10-18 MED ORDER — DSS 100 MG PO CAPS: 100.0000 mg | ORAL_CAPSULE | Freq: Two times a day (BID) | ORAL | Status: DC

## 2013-10-18 MED ORDER — TRAMADOL HCL 50 MG PO TABS: 50.0000 mg | ORAL_TABLET | Freq: Four times a day (QID) | ORAL | Status: DC | PRN

## 2013-10-18 MED ORDER — OXYCODONE HCL 5 MG PO TABS: 5.0000 mg | ORAL_TABLET | ORAL | Status: DC | PRN

## 2013-10-18 MED ORDER — POLYSACCHARIDE IRON COMPLEX 150 MG PO CAPS: 150.0000 mg | ORAL_CAPSULE | Freq: Every day | ORAL | Status: DC

## 2013-10-18 MED ORDER — CYCLOBENZAPRINE HCL 5 MG PO TABS: 5.0000 mg | ORAL_TABLET | Freq: Three times a day (TID) | ORAL | Status: DC | PRN

## 2013-10-18 MED ORDER — METOCLOPRAMIDE HCL 5 MG PO TABS: 5.0000 mg | ORAL_TABLET | Freq: Three times a day (TID) | ORAL | Status: DC | PRN

## 2013-10-18 MED ORDER — BISACODYL 10 MG RE SUPP: 10.0000 mg | Freq: Every day | RECTAL | Status: DC | PRN

## 2013-10-18 MED ORDER — RIVAROXABAN 10 MG PO TABS: 10.0000 mg | ORAL_TABLET | Freq: Every day | ORAL | Status: DC

## 2013-10-18 MED ORDER — POLYETHYLENE GLYCOL 3350 17 G PO PACK: 17.0000 g | PACK | Freq: Every day | ORAL | Status: DC | PRN

## 2013-10-18 MED ORDER — ACETAMINOPHEN 325 MG PO TABS
650.0000 mg | ORAL_TABLET | Freq: Once | ORAL | Status: AC
  Administered 2013-10-18: 650 mg via ORAL
  Filled 2013-10-18: qty 2

## 2013-10-18 MED ORDER — FUROSEMIDE 10 MG/ML IJ SOLN
10.0000 mg | Freq: Once | INTRAMUSCULAR | Status: AC
  Administered 2013-10-18: 10 mg via INTRAVENOUS
  Filled 2013-10-18: qty 1

## 2013-10-18 NOTE — Progress Notes (Signed)
OT Cancellation Note  Patient Details Name: Natasha Fletcher MRN: [id number] DOB: [birth year]   Cancelled Treatment:    Reason Eval/Treat Not Completed: Other (comment) Note Rapid Response event this am and pt currently resting. Will check back at a later time.  Jules Schick [phone] 10/18/2013, 12:33 PM

## 2013-10-18 NOTE — Progress Notes (Signed)
Physical Therapy Treatment Patient Details Name: Natasha Fletcher MRN: [id number] DOB: [birth year] Today's Date: 10/18/2013    History of Present Illness s/p L TKA     PT Comments    POD # 3 am session.  Pt stated she was feeling "okay".  Assisted OOB to [facility] + 2 assist when she stated she felt dizzy.  BP 70/43  HR 100.  RN called to room.  Just when we started to assist pt back to bed, her eyes rolled back and she became verbally unresponsive.  Emergency light pulled.  Quickly assisted pt back to bed supine.  Follow Up Recommendations  SNF     Equipment Recommendations       Recommendations for Other Services       Precautions / Restrictions Precautions Precautions: Fall Required Braces or Orthoses: Knee Immobilizer - Left Knee Immobilizer - Left: Discontinue once straight leg raise with < 10 degree lag Restrictions Weight Bearing Restrictions: No Other Position/Activity Restrictions: WBAT    Mobility  Bed Mobility Overal bed mobility: Needs Assistance Bed Mobility: Supine to Sit;Sit to Supine     Supine to sit: Mod assist Sit to supine: +2 for physical assistance;Max assist   General bed mobility comments: Assisted back to bed + 2 assist quickly due to syncope episode.  Transfers Overall transfer level: Needs assistance Equipment used: Rolling walker (2 wheeled) Transfers: Sit to/from [facility] Sit to Stand: +2 physical assistance;Mod assist;Max assist Stand pivot transfers: +2 physical assistance;Mod assist;Max assist       General transfer comment: assisted pt from EOB to [facility] + 2 assist and 75% VC's on proper tech and turn completion. Assisted back to bed + 2 total assist quickly due to syncope event.  Ambulation/Gait                 Stairs            Wheelchair Mobility    Modified Rankin (Stroke Patients Only)       Balance                                    Cognition                             Exercises      General Comments        Pertinent Vitals/Pain     Home Living                      Prior Function            PT Goals (current goals can now be found in the care plan section) Progress towards PT goals: Progressing toward goals    Frequency  7X/week    PT Plan      Co-evaluation             End of Session Equipment Utilized During Treatment: Gait belt Activity Tolerance: Other (comment) Patient left: in bed;with call bell/phone within reach;with nursing/sitter in room     Time: 2330-0762 PT Time Calculation (min): 17 min  Charges:  $Therapeutic Activity: 8-22 mins                    G Codes:      Rica Koyanagi  PTA [facility]  Acute  Rehab Pager      [phone]

## 2013-10-18 NOTE — Progress Notes (Signed)
Subjective: 3 Days Post-Op Procedure(s) (LRB): LEFT TOTAL KNEE ARTHROPLASTY (Left) Patient reports pain as mild.   Patient seen in rounds by Dr. Wynelle Link.  Had to have the foley placed back in last night due to urinary retention. Will get foley out today and ensure that she can void on her own. Patient is well, and has had no acute complaints or problems Patient is ready to go to SNF of choice.  Objective: Vital signs in last 24 hours: Temp:  [98 F (36.7 C)-98.2 F (36.8 C)] 98.2 F (36.8 C) (07/16 0600) Pulse Rate:  [85-88] 85 (07/16 0600) Resp:  [16-20] 20 (07/16 0600) BP: (144-167)/(68-72) 167/72 mmHg (07/16 0600) SpO2:  [96 %-100 %] 100 % (07/16 0600)  Intake/Output from previous day:  Intake/Output Summary (Last 24 hours) at 10/18/13 1027 Last data filed at 10/18/13 0900  Gross per 24 hour  Intake   1040 ml  Output   2975 ml  Net  -1935 ml    Intake/Output this shift: Total I/O In: 240 [P.O.:240] Out: -   Labs:  Recent Labs  10/16/13 0456 10/17/13 0527 10/18/13 0454  HGB 9.5* 9.0* 8.4*    Recent Labs  10/17/13 0527 10/18/13 0454  WBC 11.6* 9.0  RBC 3.42* 3.18*  HCT 27.9* 25.9*  PLT 148* 189    Recent Labs  10/16/13 0456 10/17/13 0527  NA 139 138  K 4.3 4.2  CL 102 99  CO2 25 26  BUN 14 14  CREATININE 0.81 0.82  GLUCOSE 164* 120*  CALCIUM 9.2 9.3   No results found for this basename: LABPT, INR,  in the last 72 hours  EXAM: General - Patient is Alert, Appropriate and Oriented Extremity - Neurovascular intact Sensation intact distally Incision - clean, dry, no drainage Motor Function - intact, moving foot and toes well on exam.   Assessment/Plan: 3 Days Post-Op Procedure(s) (LRB): LEFT TOTAL KNEE ARTHROPLASTY (Left) Procedure(s) (LRB): LEFT TOTAL KNEE ARTHROPLASTY (Left) Past Medical History  Diagnosis Date  . Hypertension   . Asthma   . Glaucoma   . Incontinence   . Wears dentures   . Wears glasses   . Thyroid disease       "something years ago"  . GERD (gastroesophageal reflux disease)   . Cancer     rt breast  . Edema leg   . Fast breathing     "because of pill"  . Pneumonia     h/o younger  . Arthritis     "all over"  . S/P total knee arthroplasty   . Glaucoma   . Swelling     bilateral feet/ legs, more left.   Active Problems:   Hypertension   Thyroid disease   OA (osteoarthritis) of knee   Postoperative anemia due to acute blood loss   Glaucoma   Insomnia   GERD (gastroesophageal reflux disease)   Urinary retention   Incontinence   Unspecified asthma(493.90)  Estimated body mass index is 24.55 kg/(m^2) as calculated from the following:   Height as of this encounter: 5\' 6"  (1.676 m).   Weight as of this encounter: 68.947 kg (152 lb). Up with therapy Discharge to SNF if she is able to void on her own today Diet - Cardiac diet Follow up - in 2 weeks Activity - WBAT Disposition - Home Condition Upon Discharge - Stable, Transfer if urinary retention resolves. D/C Meds - See DC Summary DVT Prophylaxis - Xarelto  Arlee Muslim, PA-C Orthopaedic Surgery 10/18/2013, 10:27 AM

## 2013-10-18 NOTE — Discharge Summary (Signed)
Physician Discharge Summary   Patient ID: Natasha Fletcher MRN: [id number] DOB/AGE: 1926/01/17 80 y.o.  Admit date: 10/15/2013 Discharge date: 10/18/2013  Primary Diagnosis:  Osteoarthritis Left knee(s)  Admission Diagnoses:  Past Medical History  Diagnosis Date  . Hypertension   . Asthma   . Glaucoma   . Incontinence   . Wears dentures   . Wears glasses   . Thyroid disease     "something years ago"  . GERD (gastroesophageal reflux disease)   . Cancer     rt breast  . Edema leg   . Fast breathing     "because of pill"  . Pneumonia     h/o younger  . Arthritis     "all over"  . S/P total knee arthroplasty   . Glaucoma   . Swelling     bilateral feet/ legs, more left.   Discharge Diagnoses:   Active Problems:   Hypertension   Thyroid disease   OA (osteoarthritis) of knee   Postoperative anemia due to acute blood loss   Glaucoma   Insomnia   GERD (gastroesophageal reflux disease)   Urinary retention   Incontinence   Unspecified asthma(493.90)  Estimated body mass index is 24.55 kg/(m^2) as calculated from the following:   Height as of this encounter: 5' 6"  (1.676 m).   Weight as of this encounter: 68.947 kg (152 lb).  Procedure:  Procedure(s) (LRB): LEFT TOTAL KNEE ARTHROPLASTY (Left)   Consults: None  HPI: Natasha Fletcher is a 80 y.o. year old female with end stage OA of her left knee with progressively worsening pain and dysfunction. She has constant pain, with activity and at rest and significant functional deficits with difficulties even with ADLs. She has had extensive non-op management including analgesics, injections of cortisone and viscosupplements, and home exercise program, but remains in significant pain with significant dysfunction. Radiographs show bone on bone arthritis patellofemoral and lateral. She presents now for left Total Knee Arthroplasty.   Laboratory Data: Admission on 10/15/2013  Component Date Value Ref Range Status  . ABO/RH(D)  10/15/2013 O POS   Final  . Antibody Screen 10/15/2013 NEG   Final  . Sample Expiration 10/15/2013 10/18/2013   Final  . WBC 10/16/2013 12.1* 4.0 - 10.5 K/uL Final  . RBC 10/16/2013 3.51* 3.87 - 5.11 MIL/uL Final  . Hemoglobin 10/16/2013 9.5* 12.0 - 15.0 g/dL Final  . HCT 10/16/2013 29.4* 36.0 - 46.0 % Final  . MCV 10/16/2013 83.8  78.0 - 100.0 fL Final  . MCH 10/16/2013 27.1  26.0 - 34.0 pg Final  . MCHC 10/16/2013 32.3  30.0 - 36.0 g/dL Final  . RDW 10/16/2013 14.7  11.5 - 15.5 % Final  . Platelets 10/16/2013 205  150 - 400 K/uL Final  . Sodium 10/16/2013 139  137 - 147 mEq/L Final  . Potassium 10/16/2013 4.3  3.7 - 5.3 mEq/L Final  . Chloride 10/16/2013 102  96 - 112 mEq/L Final  . CO2 10/16/2013 25  19 - 32 mEq/L Final  . Glucose, Bld 10/16/2013 164* 70 - 99 mg/dL Final  . BUN 10/16/2013 14  6 - 23 mg/dL Final  . Creatinine, Ser 10/16/2013 0.81  0.50 - 1.10 mg/dL Final  . Calcium 10/16/2013 9.2  8.4 - 10.5 mg/dL Final  . GFR calc non Af Amer 10/16/2013 64* >90 mL/min Final  . GFR calc Af Amer 10/16/2013 75* >90 mL/min Final   Comment: (NOTE)  The eGFR has been calculated using the CKD EPI equation.                          This calculation has not been validated in all clinical situations.                          eGFR's persistently <90 mL/min signify possible Chronic Kidney                          Disease.  . Anion gap 10/16/2013 12  5 - 15 Final  . WBC 10/17/2013 11.6* 4.0 - 10.5 K/uL Final  . RBC 10/17/2013 3.42* 3.87 - 5.11 MIL/uL Final  . Hemoglobin 10/17/2013 9.0* 12.0 - 15.0 g/dL Final  . HCT 10/17/2013 27.9* 36.0 - 46.0 % Final  . MCV 10/17/2013 81.6  78.0 - 100.0 fL Final  . MCH 10/17/2013 26.3  26.0 - 34.0 pg Final  . MCHC 10/17/2013 32.3  30.0 - 36.0 g/dL Final  . RDW 10/17/2013 14.8  11.5 - 15.5 % Final  . Platelets 10/17/2013 148* 150 - 400 K/uL Final   Comment: SPECIMEN CHECKED FOR CLOTS                          DELTA CHECK NOTED                           REPEATED TO VERIFY  . Sodium 10/17/2013 138  137 - 147 mEq/L Final  . Potassium 10/17/2013 4.2  3.7 - 5.3 mEq/L Final  . Chloride 10/17/2013 99  96 - 112 mEq/L Final  . CO2 10/17/2013 26  19 - 32 mEq/L Final  . Glucose, Bld 10/17/2013 120* 70 - 99 mg/dL Final  . BUN 10/17/2013 14  6 - 23 mg/dL Final  . Creatinine, Ser 10/17/2013 0.82  0.50 - 1.10 mg/dL Final  . Calcium 10/17/2013 9.3  8.4 - 10.5 mg/dL Final  . GFR calc non Af Amer 10/17/2013 63* >90 mL/min Final  . GFR calc Af Amer 10/17/2013 74* >90 mL/min Final   Comment: (NOTE)                          The eGFR has been calculated using the CKD EPI equation.                          This calculation has not been validated in all clinical situations.                          eGFR's persistently <90 mL/min signify possible Chronic Kidney                          Disease.  . Anion gap 10/17/2013 13  5 - 15 Final  . WBC 10/18/2013 9.0  4.0 - 10.5 K/uL Final  . RBC 10/18/2013 3.18* 3.87 - 5.11 MIL/uL Final  . Hemoglobin 10/18/2013 8.4* 12.0 - 15.0 g/dL Final  . HCT 10/18/2013 25.9* 36.0 - 46.0 % Final  . MCV 10/18/2013 81.4  78.0 - 100.0 fL Final  . MCH 10/18/2013 26.4  26.0 - 34.0 pg Final  . MCHC 10/18/2013 32.4  30.0 -  36.0 g/dL Final  . RDW 10/18/2013 14.6  11.5 - 15.5 % Final  . Platelets 10/18/2013 189  150 - 400 K/uL Final  Hospital Outpatient Visit on 10/02/2013  Component Date Value Ref Range Status  . MRSA, PCR 10/02/2013 NEGATIVE  NEGATIVE Final  . Staphylococcus aureus 10/02/2013 NEGATIVE  NEGATIVE Final   Comment:                                 The Xpert SA Assay (FDA                          approved for NASAL specimens                          in patients over 63 years of age),                          is one component of                          a comprehensive surveillance                          program.  Test performance has                          been validated by [employer] for patients greater                          than or equal to 38 year old.                          It is not intended                          to diagnose infection nor to                          guide or monitor treatment.  Marland Kitchen aPTT 10/02/2013 33  24 - 37 seconds Final  . WBC 10/02/2013 5.1  4.0 - 10.5 K/uL Final  . RBC 10/02/2013 4.42  3.87 - 5.11 MIL/uL Final  . Hemoglobin 10/02/2013 11.6* 12.0 - 15.0 g/dL Final  . HCT 10/02/2013 36.4  36.0 - 46.0 % Final  . MCV 10/02/2013 82.4  78.0 - 100.0 fL Final  . MCH 10/02/2013 26.2  26.0 - 34.0 pg Final  . MCHC 10/02/2013 31.9  30.0 - 36.0 g/dL Final  . RDW 10/02/2013 14.6  11.5 - 15.5 % Final  . Platelets 10/02/2013 244  150 - 400 K/uL Final  . Sodium 10/02/2013 142  137 - 147 mEq/L Final  . Potassium 10/02/2013 3.6* 3.7 - 5.3 mEq/L Final  . Chloride 10/02/2013 102  96 - 112 mEq/L Final  . CO2 10/02/2013 27  19 - 32 mEq/L Final  . Glucose, Bld 10/02/2013 91  70 - 99 mg/dL Final  . BUN 10/02/2013 11  6 - 23 mg/dL Final  .  Creatinine, Ser 10/02/2013 0.74  0.50 - 1.10 mg/dL Final  . Calcium 10/02/2013 9.9  8.4 - 10.5 mg/dL Final  . Total Protein 10/02/2013 6.9  6.0 - 8.3 g/dL Final  . Albumin 10/02/2013 4.0  3.5 - 5.2 g/dL Final  . AST 10/02/2013 20  0 - 37 U/L Final  . ALT 10/02/2013 13  0 - 35 U/L Final  . Alkaline Phosphatase 10/02/2013 95  39 - 117 U/L Final  . Total Bilirubin 10/02/2013 0.8  0.3 - 1.2 mg/dL Final  . GFR calc non Af Amer 10/02/2013 75* >90 mL/min Final  . GFR calc Af Amer 10/02/2013 87* >90 mL/min Final   Comment: (NOTE)                          The eGFR has been calculated using the CKD EPI equation.                          This calculation has not been validated in all clinical situations.                          eGFR's persistently <90 mL/min signify possible Chronic Kidney                          Disease.  Marland Kitchen Prothrombin Time 10/02/2013 13.5  11.6 - 15.2 seconds Final  . INR 10/02/2013 1.03  0.00  - 1.49 Final  . Color, Urine 10/02/2013 YELLOW  YELLOW Final  . APPearance 10/02/2013 CLEAR  CLEAR Final  . Specific Gravity, Urine 10/02/2013 1.011  1.005 - 1.030 Final  . pH 10/02/2013 7.5  5.0 - 8.0 Final  . Glucose, UA 10/02/2013 NEGATIVE  NEGATIVE mg/dL Final  . Hgb urine dipstick 10/02/2013 SMALL* NEGATIVE Final  . Bilirubin Urine 10/02/2013 NEGATIVE  NEGATIVE Final  . Ketones, ur 10/02/2013 NEGATIVE  NEGATIVE mg/dL Final  . Protein, ur 10/02/2013 30* NEGATIVE mg/dL Final  . Urobilinogen, UA 10/02/2013 0.2  0.0 - 1.0 mg/dL Final  . Nitrite 10/02/2013 NEGATIVE  NEGATIVE Final  . Leukocytes, UA 10/02/2013 TRACE* NEGATIVE Final  . Squamous Epithelial / LPF 10/02/2013 RARE  RARE Final  . WBC, UA 10/02/2013 0-2  <3 WBC/hpf Final  . RBC / HPF 10/02/2013 3-6  <3 RBC/hpf Final  . Bacteria, UA 10/02/2013 FEW* RARE Final     X-Rays:No results found.  EKG: Orders placed during the hospital encounter of 02/19/13  . EKG     Hospital Course: Houstonia is a 80 y.o. who was admitted to [facility]. They were brought to the operating room on 10/15/2013 and underwent Procedure(s): LEFT TOTAL KNEE ARTHROPLASTY.  Patient tolerated the procedure well and was later transferred to the recovery room and then to the orthopaedic floor for postoperative care.  They were given PO and IV analgesics for pain control following their surgery.  They were given 24 hours of postoperative antibiotics of  Anti-infectives   Start     Dose/Rate Route Frequency Ordered Stop   10/15/13 1800  ceFAZolin (ANCEF) IVPB 2 g/50 mL premix     2 g 100 mL/hr over 30 Minutes Intravenous Every 6 hours 10/15/13 1309 10/16/13 0128   10/15/13 0740  ceFAZolin (ANCEF) IVPB 2 g/50 mL premix     2 g 100 mL/hr over 30 Minutes Intravenous On call to O.R. 10/15/13  0740 10/15/13 1038     and started on DVT prophylaxis in the form of Xarelto.   PT and OT were ordered for total joint protocol.  Discharge planning consulted to  help with postop disposition and equipment needs.  Patient had a tough night on the evening of surgery with pain and blood pressure was noted to be elevated. She was started back onto her blood pressure medication and encouraged to take the pain medications for better control of her pain. They started to get up OOB with therapy on day one since she ws feeling a little better with the med changes. Hemovac drain was pulled without difficulty.  Continued to work with therapy into day two.  Dressing was changed on day two and the incision was healing well.  By day three, the patient had progressed with therapy and meeting their goals.  Incision was healing well.  Patient was seen in rounds on day three by Dr. Wynelle Link and was found to have the foley back in.  She had difficulty voiding the evening of POD two and had to have the foley placed back into her bladder.  She was feeling better on the morning of day three and it was felt that as along as she could void on her own then she would be ready to go to the SNF of choice.  Discharge to SNF if she is able to void on her own today  Diet - Cardiac diet  Follow up - in 2 weeks  Activity - WBAT  Disposition - Home  Condition Upon Discharge - Stable, Transfer if urinary retention resolves.  D/C Meds - See DC Summary  DVT Prophylaxis - Xarelto    Discharge Instructions   Call MD / Call 911    Complete by:  As directed   If you experience chest pain or shortness of breath, CALL 911 and be transported to the hospital emergency room.  If you develope a fever above 101 F, pus (white drainage) or increased drainage or redness at the wound, or calf pain, call your surgeon's office.     Change dressing    Complete by:  As directed   Change dressing daily with sterile 4 x 4 inch gauze dressing and apply TED hose. Do not submerge the incision under water.     Constipation Prevention    Complete by:  As directed   Drink plenty of fluids.  Prune juice may be helpful.   You may use a stool softener, such as Colace (over the counter) 100 mg twice a day.  Use MiraLax (over the counter) for constipation as needed.     Diet - low sodium heart healthy    Complete by:  As directed      Discharge instructions    Complete by:  As directed   Pick up stool softner and laxative for home. Do not submerge incision under water. May shower. Continue to use ice for pain and swelling from surgery.  Take Xarelto for two and a half more weeks, then discontinue Xarelto. Once the patient has completed the blood thinner regimen, then take a Baby 81 mg Aspirin daily for three more weeks.  When discharged from the skilled rehab facility, please have the facility set up the patient's [place] prior to being released.  Also provide the patient with their medications at time of release from the facility to include their pain medication, the muscle relaxants, and their blood thinner medication.  If the  patient is still at the rehab facility at time of follow up appointment, please also assist the patient in arranging follow up appointment in our office and any transportation needs.     Do not put a pillow under the knee. Place it under the heel.    Complete by:  As directed      Do not sit on low chairs, stoools or toilet seats, as it may be difficult to get up from low surfaces    Complete by:  As directed      Driving restrictions    Complete by:  As directed   No driving until released by the physician.     Increase activity slowly as tolerated    Complete by:  As directed      Lifting restrictions    Complete by:  As directed   No lifting until released by the physician.     Patient may shower    Complete by:  As directed   You may shower without a dressing once there is no drainage.  Do not wash over the wound.  If drainage remains, do not shower until drainage stops.     TED hose    Complete by:  As directed   Use stockings (TED hose) for 3 weeks on  both leg(s).  You may remove them at night for sleeping.     Weight bearing as tolerated    Complete by:  As directed             Medication List    STOP taking these medications       CALCIUM 600 + D PO     HYDROcodone-acetaminophen 7.5-325 MG per tablet  Commonly known as:  NORCO     MELATONIN + L-THEANINE PO     Vitamin D (Cholecalciferol) 1000 UNITS Tabs      TAKE these medications       acetaminophen 325 MG tablet  Commonly known as:  TYLENOL  Take 2 tablets (650 mg total) by mouth every 6 (six) hours as needed for mild pain (or Fever >/= 101).     anastrozole 1 MG tablet  Commonly known as:  ARIMIDEX  Take 1 tablet (1 mg total) by mouth daily.     bisacodyl 10 MG suppository  Commonly known as:  DULCOLAX  Place 1 suppository (10 mg total) rectally daily as needed for moderate constipation.     cyclobenzaprine 5 MG tablet  Commonly known as:  FLEXERIL  Take 1 tablet (5 mg total) by mouth 3 (three) times daily as needed for muscle spasms.     DSS 100 MG Caps  Take 100 mg by mouth 2 (two) times daily.     flurazepam 15 MG capsule  Commonly known as:  DALMANE  Take 15 mg by mouth at bedtime as needed for sleep.     furosemide 20 MG tablet  Commonly known as:  LASIX  Take 20 mg by mouth every morning.     iron polysaccharides 150 MG capsule  Commonly known as:  NIFEREX  Take 1 capsule (150 mg total) by mouth daily.     latanoprost 0.005 % ophthalmic solution  Commonly known as:  XALATAN  Place 1 drop into both eyes at bedtime.     lisinopril 20 MG tablet  Commonly known as:  PRINIVIL,ZESTRIL  Take 20 mg by mouth every morning.     metoCLOPramide 5 MG tablet  Commonly known as:  REGLAN  Take 1-2  tablets (5-10 mg total) by mouth every 8 (eight) hours as needed for nausea (if ondansetron (ZOFRAN) ineffective.).     ondansetron 4 MG tablet  Commonly known as:  ZOFRAN  Take 1 tablet (4 mg total) by mouth every 6 (six) hours as needed for nausea.      oxybutynin 5 MG tablet  Commonly known as:  DITROPAN  Take 5 mg by mouth 3 (three) times daily. To prevent urinary frequency     oxyCODONE 5 MG immediate release tablet  Commonly known as:  Oxy IR/ROXICODONE  Take 1-2 tablets (5-10 mg total) by mouth every 3 (three) hours as needed for moderate pain, severe pain or breakthrough pain.     polyethylene glycol packet  Commonly known as:  MIRALAX / GLYCOLAX  Take 17 g by mouth daily as needed for mild constipation or moderate constipation.     predniSONE 5 MG tablet  Commonly known as:  DELTASONE  Take 5 mg by mouth daily with breakfast.     rivaroxaban 10 MG Tabs tablet  Commonly known as:  XARELTO  - Take 1 tablet (10 mg total) by mouth daily with breakfast. Take Xarelto for two and a half more weeks, then discontinue Xarelto.  - Once the patient has completed the blood thinner regimen, then take a Baby 81 mg Aspirin daily for three more weeks.     traMADol 50 MG tablet  Commonly known as:  ULTRAM  Take 1-2 tablets (50-100 mg total) by mouth every 6 (six) hours as needed (mild pain).           Follow-up Information   Follow up with Gearlean Alf, MD. Schedule an appointment as soon as possible for a visit in 2 weeks.   Specialty:  Orthopedic Surgery   Contact information:   [address] [place] [address] [phone]       Signed: Arlee Muslim, PA-C Orthopaedic Surgery 10/18/2013, 10:34 AM

## 2013-10-18 NOTE — Progress Notes (Signed)
Orders received from PA  Arlee Muslim  For 2 units of blood to transfuse today & to cancel  discharge. Pt. resting  In bed,comfortable at present

## 2013-10-18 NOTE — Significant Event (Signed)
Rapid Response Event Note  Overview:      Initial Focused Assessment: Neuro   Interventions:   Event Summary: RRT called to bedside room 1617 by operator. Upon RRT arrival Pt supine in bed. Alert and Oriented to name, place and reason for admission. Stated she felt horrible, the pain in her knee was bad and her head felt funny. Bedside RN reports pt sitting on bedside commode with PT/OT and felt weak. SBP in 70's. Assisted back to bed and RN noted pt's eyes rolled back. RRT called.  SBP 159-163, DBP 67-69, HR 88 NSR, RR 20-22, Sats 100% on [place] 2 L. Neuro checks WNL, grips equal, eyebrow raises equal, tongue symmetrical, smile symmetrical. Probable syncopal episode given that she was on bedside commode. PT recovered from event.  Arlee Muslim called by RN and will see.    at      at          [address], [place]

## 2013-10-18 NOTE — Progress Notes (Signed)
LATE ENTRY NOTE Date of Service of Visit - 10/17/2013    Subjective: 2 Days Post-Op Procedure(s) (LRB): LEFT TOTAL KNEE ARTHROPLASTY (Left) Patient reports pain as moderate.   Patient seen in rounds with Dr. Wynelle Link.  Had severe pain the night before but a little better today. Patient is well, but has had some minor complaints of pain in the knee, requiring pain medications Plan is to go Skilled nursing facility after hospital stay.  Objective: Vital signs in last 24 hours: Temp:  [98 F (36.7 C)-98.2 F (36.8 C)] 98.2 F (36.8 C) (07/16 0600) Pulse Rate:  [85-88] 85 (07/16 0600) Resp:  [16-20] 20 (07/16 0600) BP: (144-167)/(68-72) 167/72 mmHg (07/16 0600) SpO2:  [96 %-100 %] 100 % (07/16 0600)  Intake/Output from previous day:  Intake/Output Summary (Last 24 hours) at 10/18/13 1012 Last data filed at 10/18/13 0900  Gross per 24 hour  Intake   1040 ml  Output   2975 ml  Net  -1935 ml    Intake/Output this shift: Total I/O In: 240 [P.O.:240] Out: -   Labs:  Recent Labs  10/16/13 0456 10/17/13 0527 10/18/13 0454  HGB 9.5* 9.0* 8.4*    Recent Labs  10/17/13 0527 10/18/13 0454  WBC 11.6* 9.0  RBC 3.42* 3.18*  HCT 27.9* 25.9*  PLT 148* 189    Recent Labs  10/16/13 0456 10/17/13 0527  NA 139 138  K 4.3 4.2  CL 102 99  CO2 25 26  BUN 14 14  CREATININE 0.81 0.82  GLUCOSE 164* 120*  CALCIUM 9.2 9.3   No results found for this basename: LABPT, INR,  in the last 72 hours  EXAM General - Patient is Alert and Appropriate Extremity - Neurovascular intact Sensation intact distally Dressing/Incision - clean, dry, no drainage, healing Motor Function - intact, moving foot and toes well on exam.   Past Medical History  Diagnosis Date  . Hypertension   . Asthma   . Glaucoma   . Incontinence   . Wears dentures   . Wears glasses   . Thyroid disease     "something years ago"  . GERD (gastroesophageal reflux disease)   . Cancer     rt breast  .  Edema leg   . Fast breathing     "because of pill"  . Pneumonia     h/o younger  . Arthritis     "all over"  . S/P total knee arthroplasty   . Glaucoma   . Swelling     bilateral feet/ legs, more left.    Assessment/Plan: 3 Days Post-Op Procedure(s) (LRB): LEFT TOTAL KNEE ARTHROPLASTY (Left) Active Problems:   OA (osteoarthritis) of knee  Estimated body mass index is 24.55 kg/(m^2) as calculated from the following:   Height as of this encounter: 5\' 6"  (1.676 m).   Weight as of this encounter: 68.947 kg (152 lb). Up with therapy Plan for discharge tomorrow Discharge home with home health  DVT Prophylaxis - Xarelto Weight-Bearing as tolerated to left leg Tapering Prednisone dosing postop  Blood pressure improving   Arlee Muslim, PA-C Orthopaedic Surgery 10/18/2013, 10:12 AM

## 2013-10-19 ENCOUNTER — Other Ambulatory Visit: Payer: Self-pay

## 2013-10-19 DIAGNOSIS — E860 Dehydration: Secondary | ICD-10-CM

## 2013-10-19 DIAGNOSIS — I959 Hypotension, unspecified: Secondary | ICD-10-CM

## 2013-10-19 DIAGNOSIS — R55 Syncope and collapse: Secondary | ICD-10-CM

## 2013-10-19 DIAGNOSIS — R339 Retention of urine, unspecified: Secondary | ICD-10-CM

## 2013-10-19 LAB — TYPE AND SCREEN
ABO/RH(D): O POS
ANTIBODY SCREEN: NEGATIVE
UNIT DIVISION: 0
Unit division: 0

## 2013-10-19 LAB — URINALYSIS, ROUTINE W REFLEX MICROSCOPIC
Bilirubin Urine: NEGATIVE
GLUCOSE, UA: NEGATIVE mg/dL
Ketones, ur: NEGATIVE mg/dL
Leukocytes, UA: NEGATIVE
Nitrite: NEGATIVE
Protein, ur: NEGATIVE mg/dL
Specific Gravity, Urine: 1.011 (ref 1.005–1.030)
Urobilinogen, UA: 1 mg/dL (ref 0.0–1.0)
pH: 7 (ref 5.0–8.0)

## 2013-10-19 LAB — CBC
HCT: 33 % — ABNORMAL LOW (ref 36.0–46.0)
HEMOGLOBIN: 11.1 g/dL — AB (ref 12.0–15.0)
MCH: 27.8 pg (ref 26.0–34.0)
MCHC: 33.6 g/dL (ref 30.0–36.0)
MCV: 82.5 fL (ref 78.0–100.0)
PLATELETS: 211 10*3/uL (ref 150–400)
RBC: 4 MIL/uL (ref 3.87–5.11)
RDW: 14.6 % (ref 11.5–15.5)
WBC: 10.1 10*3/uL (ref 4.0–10.5)

## 2013-10-19 LAB — BASIC METABOLIC PANEL
Anion gap: 9 (ref 5–15)
BUN: 16 mg/dL (ref 6–23)
CALCIUM: 9.1 mg/dL (ref 8.4–10.5)
CO2: 29 mEq/L (ref 19–32)
Chloride: 98 mEq/L (ref 96–112)
Creatinine, Ser: 0.84 mg/dL (ref 0.50–1.10)
GFR, EST AFRICAN AMERICAN: 71 mL/min — AB (ref >90)
GFR, EST NON AFRICAN AMERICAN: 62 mL/min — AB (ref >90)
Glucose, Bld: 109 mg/dL — ABNORMAL HIGH (ref 70–99)
Potassium: 3.9 mEq/L (ref 3.7–5.3)
Sodium: 136 mEq/L — ABNORMAL LOW (ref 137–147)

## 2013-10-19 LAB — URINE MICROSCOPIC-ADD ON

## 2013-10-19 MED ORDER — HYDROCODONE-ACETAMINOPHEN 5-325 MG PO TABS
1.0000 | ORAL_TABLET | ORAL | Status: DC | PRN
  Administered 2013-10-19: 2 via ORAL
  Administered 2013-10-19 (×2): 1 via ORAL
  Administered 2013-10-20: 2 via ORAL
  Filled 2013-10-19: qty 1
  Filled 2013-10-19 (×2): qty 2
  Filled 2013-10-19: qty 1

## 2013-10-19 MED ORDER — SODIUM CHLORIDE 0.9 % IV SOLN
INTRAVENOUS | Status: AC
  Administered 2013-10-19: 17:00:00 via INTRAVENOUS

## 2013-10-19 NOTE — Progress Notes (Signed)
Pt has received authorization from [facility] for SNF placement this weekend. Weekend CSW will assist with d/c planning to SNF.  Werner Lean LCSW [phone]

## 2013-10-19 NOTE — Progress Notes (Signed)
Occupational Therapy Treatment Patient Details Name: Natasha Fletcher MRN: [id number] DOB: 1926-01-23 Today's Date: 10/19/2013    History of present illness s/p L TKA    OT comments  Pt up to recliner with +2 assist. Nursing tech present to take orthostatic vitals. Pt with some dizziness reported but was able to tolerate using the walker to pivot around and recliner brought up. She reports pain as 9/10 with activity but down to 7/10 with rest in recliner. She is motivated and wants to do what she can. Will benefit from continued OT.   Follow Up Recommendations  Supervision/Assistance - 24 hour;SNF    Equipment Recommendations  3 in 1 bedside comode    Recommendations for Other Services      Precautions / Restrictions Precautions Precautions: Fall Precaution Comments: monitor BP Required Braces or Orthoses: Knee Immobilizer - Left Knee Immobilizer - Left: Discontinue once straight leg raise with < 10 degree lag Restrictions Weight Bearing Restrictions: No Other Position/Activity Restrictions: WBAT       Mobility Bed Mobility Overal bed mobility: Needs Assistance Bed Mobility: Supine to Sit     Supine to sit: +2 for safety/equipment;+2 for physical assistance;Mod assist    General bed mobility comments: assist for L LE over to EOB and some assist for trunk to upright.   Transfers Overall transfer level: Needs assistance Equipment used: Rolling walker (2 wheeled) Transfers: Sit to/from Stand Sit to Stand: +2 physical assistance;+2 safety/equipment;Mod assist Stand pivot transfers: +2 physical assistance;+2 safety/equipment;Mod assist       General transfer comment: stood for BP to be taken but pt starting to feel dizzy so sat back down after vital taken. Stood again and able to pivot around but chair brought up right behind her. Assist for supporting L LE up onto footrest. verbal cues and assist for hand placement and to extend L LE out in front with sitting.      Balance                                   ADL                           Toilet Transfer: +2 for physical assistance;+2 for safety/equipment;Moderate assistance;Stand-pivot;RW             General ADL Comments: Orthostatic BP taken with nursing tech present. Pt attempted standing and became dizzy/light headed during orthostatic BP being taken so once vital taken, pt sat back down on EOB. Rested for several minutes and pt able to stand again and pivot around but chair brought up behind her. She declined need to sit on [place] currently. Pain up to 9/10 with activity but down to 7/10 toward the end of the end of the session.       Vision                     Perception     Praxis      Cognition   Behavior During Therapy: [facility] for tasks assessed/performed Overall Cognitive Status: Within Functional Limits for tasks assessed                       Extremity/Trunk Assessment               Exercises     Shoulder Instructions       General  Comments      Pertinent Vitals/ Pain       7/10 start of session L knee; 9/10 during activity; 7/10 at end of session, reposition, ice; see vital section for orthostatic vitals taken by nursing tech during session.  Home Living                                          Prior Functioning/Environment              Frequency Min 2X/week     Progress Toward Goals  OT Goals(current goals can now be found in the care plan section)  Progress towards OT goals: Progressing toward goals     Plan Discharge plan remains appropriate    Co-evaluation                 End of Session Equipment Utilized During Treatment: Rolling walker;Left knee immobilizer;Gait belt   Activity Tolerance Patient limited by pain   Patient Left in chair;with call bell/phone within reach   Nurse Communication          Time: 8850-2774 OT Time Calculation (min): 30 min  Charges: OT  General Charges $OT Visit: 1 Procedure OT Treatments $Self Care/Home Management : 8-22 mins $Therapeutic Activity: 8-22 mins  Jules Schick [phone] 10/19/2013, 12:47 PM

## 2013-10-19 NOTE — Progress Notes (Signed)
Physical Therapy Treatment Patient Details Name: Natasha Fletcher MRN: [id number] DOB: 1925-10-13 Today's Date: 10/19/2013    History of Present Illness s/p L TKA     PT Comments    POD # 4 L TKR having issues of urine retention and syncope episode yesterday when sitting on OOB.  Pt progressing slowly and will need ST Rehab at SNF.    Follow Up Recommendations  SNF     Equipment Recommendations       Recommendations for Other Services       Precautions / Restrictions Precautions Precautions: Fall Precaution Comments: monitor BP Required Braces or Orthoses: Knee Immobilizer - Left Knee Immobilizer - Left: Discontinue once straight leg raise with < 10 degree lag Restrictions Weight Bearing Restrictions: No Other Position/Activity Restrictions: WBAT    Mobility  Bed Mobility Overal bed mobility: Needs Assistance Bed Mobility: Sit to Supine     Supine to sit: +2 for safety/equipment;+2 for physical assistance;Mod assist Sit to supine: +2 for physical assistance;Max assist   General bed mobility comments: assist B LE's up onto bed and use of trapeze to center self.    Transfers Overall transfer level: Needs assistance Equipment used: Rolling walker (2 wheeled) Transfers: Sit to/from Stand Sit to Stand: +2 physical assistance;+2 safety/equipment;Mod assist Stand pivot transfers: +2 physical assistance;+2 safety/equipment;Mod assist       General transfer comment: assisted out of recliner + 2 assist with 50% VC's on proper hand placement and increased time.  &%% VC's on proper tech with stand to sit to reach back and advance L LE prior to sit.    Ambulation/Gait   Ambulation Distance (Feet): 2 Feet Assistive device: Rolling walker (2 wheeled) Gait Pattern/deviations: Step-to pattern;Decreased stance time - left;Trunk flexed Gait velocity: decreased   General Gait Details: pt was only able to amb 2 feet from recliner to bed due to MAX c/o nausea   Stairs             Wheelchair Mobility    Modified Rankin (Stroke Patients Only)       Balance                                    Cognition Arousal/Alertness: Awake/alert Behavior During Therapy: WFL for tasks assessed/performed Overall Cognitive Status: Within Functional Limits for tasks assessed                      Exercises   Total Knee Replacement TE's 10 reps B LE ankle pumps 10 reps towel squeezes 10 reps knee presses 10 reps heel slides  10 reps SAQ's 10 reps SLR's 10 reps ABD Followed by ICE Unable to perform all due to MAX c/o nausea with TE's    General Comments        Pertinent Vitals/Pain C/o max nausea    Home Living                      Prior Function            PT Goals (current goals can now be found in the care plan section) Progress towards PT goals: Progressing toward goals    Frequency  7X/week    PT Plan      Co-evaluation             End of Session Equipment Utilized During Treatment: Gait belt Activity Tolerance: Patient limited by  fatigue;Other (comment) (nausea)       Time: 3614-4315 PT Time Calculation (min): 31 min  Charges:  $Gait Training: 8-22 mins $Therapeutic Exercise: 8-22 mins                    G Codes:      Rica Koyanagi  PTA [facility]  Acute  Rehab Pager      [phone]

## 2013-10-19 NOTE — Consult Note (Addendum)
[facility] Medical Consultation  Ariann Khaimov Asfaw BHA:[id number] DOB: February 26, 1926 DOA: 10/15/2013 PCP: Myriam Jacobson, MD   Requesting physician: Dr. Gaynelle Arabian Date of consultation: 10/19/2013 Reason for consultation: Assistance with evaluation and management of dizziness and urinary retention issues.  Impression/Recommendations Active Problems:   Hypertension   Thyroid disease   OA (osteoarthritis) of knee   Postoperative anemia due to acute blood loss   Glaucoma   Insomnia   GERD (gastroesophageal reflux disease)   Urinary retention   Incontinence   Unspecified asthma(493.90)    1. Syncope versus near syncope: Patient complains of some dizziness and lightheadedness on standing. On 10/18/13, she was sitting on bedside commode with PT OT and felt weak, systolic blood pressure in the 70s and nursing staff noted eyes rolled back. Rapid response was called and she promptly recovered. This was likely multifactorial secondary to hypotension and vasovagal from pain. She was not orthostatic this morning but still has mild dizziness with standing which may be secondary to intravascular volume depletion and pain medications. Briefly hydrate with IV fluids, minimize pain medications as much as possible and consider thigh high Ted stockings. 2. Hypotension: Secondary to dehydration from poor oral intake and acute blood loss anemia. Resolved after 2 units of PRBC transfusion. Even though hypotension has resolved, she has some dizziness suggestive of orthostatic symptoms. Brief IV fluids. 3. Dehydration: Secondary to poor oral intake. Brief IV fluids. 4. Urinary retention: Likely secondary to immobility, pain and narcotic pain medications. Mobilize patient and minimize opioids as much as possible. If problem persists, please consult patient's primary urologist Dr. Luberta Robertson. Continue oxybutynin. Urine microscopy negative for UTI. 5. Hypertension: Controlled. No orthostatic changes today.  Continue lisinopril. 6. Acute blood loss anemia: Status post 2 units of PRBC transfusion. Improved. History of breast cancer: Continue Arimidex. 7. Constipation: Continue bowel regimen. 8. Chronic prednisone: Unclear indication. Continue prednisone.  [facility] will followup again tomorrow. Please contact me if I can be of assistance in the meanwhile. Thank you for this consultation.  Chief Complaint: Dizziness, urinary difficulties and constipation.  HPI:  80 year old female patient with history of hypertension, breast cancer, GERD, thyroid disease, status post left TKA for osteoarthritis on 10/15/13, had an episode of syncope/near syncope on 11/16 while working with PT which was associated with hypotension in the 70s. She was transfused 2 units of PRBC for hemoglobin of 8.4 with appropriate improvement 11.1. She continues to have intermittent dizziness on standing but has had no further syncopal episodes. No reported history of chest pain, dyspnea or palpitations. She complains of constipation and has not had a BM for a couple of days. Passing flatus. Denies abdominal pain, nausea or vomiting. States that her appetite has been poor and has not been eating or drinking much. Patient also unable to urinate completely and has had in and out Foley catheterization and indwelling Foley placed. She complains of 6/10 left knee pain but states that it is improving.  Review of Systems:  All systems reviewed and apart from history of presenting illness, are negative.  Past Medical History  Diagnosis Date  . Hypertension   . Asthma   . Glaucoma   . Incontinence   . Wears dentures   . Wears glasses   . Thyroid disease     "something years ago"  . GERD (gastroesophageal reflux disease)   . Cancer     rt breast  . Edema leg   . Fast breathing     "because of pill"  .  Pneumonia     h/o younger  . Arthritis     "all over"  . S/P total knee arthroplasty   . Glaucoma   . Swelling     bilateral feet/  legs, more left.   Past Surgical History  Procedure Laterality Date  . Total hip arthroplasty Bilateral   . Shoulder surgery Left   . Appendectomy    . Carpal tunnel release Bilateral   . Tonsillectomy    . Colonoscopy    . Abdominal hysterectomy    . Eye surgery      both cataracts  . Breast biopsy  04/27/2011    Procedure: BREAST BIOPSY WITH NEEDLE LOCALIZATION;  Surgeon: Edward Jolly, MD;  Location: [facility];  Service: General;  Laterality: Right;  right needle localized breast lumpectomy   . Breast lumpectomy  04/27/11    right breast by [facility]  . Total knee arthroplasty Right 02/19/2013    Procedure: RIGHT TOTAL KNEE ARTHROPLASTY;  Surgeon: Gearlean Alf, MD;  Location: [facility];  Service: Orthopedics;  Laterality: Right;  . Total knee arthroplasty Left 10/15/2013    Procedure: LEFT TOTAL KNEE ARTHROPLASTY;  Surgeon: Gearlean Alf, MD;  Location: [facility];  Service: Orthopedics;  Laterality: Left;   Social History:  reports that she quit smoking about 38 years ago. Her smoking use included Cigarettes. She has a 15 pack-year smoking history. She has never used smokeless tobacco. She reports that she does not drink alcohol or use illicit drugs.  Allergies  Allergen Reactions  . Milk-Related Compounds Other (See Comments)    Upset stomach   . Sulfa Antibiotics Other (See Comments)    Reaction many years ago-unknown   Family History  Problem Relation Age of Onset  . Heart disease Mother   . Stroke Father     Prior to Admission medications   Medication Sig Start Date End Date Taking? Authorizing Provider  acetaminophen (TYLENOL) 325 MG tablet Take 2 tablets (650 mg total) by mouth every 6 (six) hours as needed for mild pain (or Fever >/= 101). 10/18/13   Arlee Muslim, PA-C  anastrozole (ARIMIDEX) 1 MG tablet Take 1 tablet (1 mg total) by mouth daily. 10/08/13  Yes Adrena E Johnson, PA-C  bisacodyl (DULCOLAX) 10 MG suppository Place 1 suppository (10 mg  total) rectally daily as needed for moderate constipation. 10/18/13   Arlee Muslim, PA-C  Calcium Carb-Cholecalciferol (CALCIUM 600 + D PO) Take 1 tablet by mouth 2 (two) times daily.    Historical Provider, MD  cyclobenzaprine (FLEXERIL) 5 MG tablet Take 1 tablet (5 mg total) by mouth 3 (three) times daily as needed for muscle spasms. 10/18/13   Arlee Muslim, PA-C  docusate sodium 100 MG CAPS Take 100 mg by mouth 2 (two) times daily. 10/18/13   Arlee Muslim, PA-C  flurazepam [facility]) 15 MG capsule Take 15 mg by mouth at bedtime as needed for sleep.    Historical Provider, MD  furosemide (LASIX) 20 MG tablet Take 20 mg by mouth every morning.   Yes Historical Provider, MD  HYDROcodone-acetaminophen [facility]) 7.5-325 MG per tablet  09/05/13   Historical Provider, MD  iron polysaccharides (NIFEREX) 150 MG capsule Take 1 capsule (150 mg total) by mouth daily. 10/18/13   Arlee Muslim, PA-C  latanoprost (XALATAN) 0.005 % ophthalmic solution Place 1 drop into both eyes at bedtime.   Yes Historical Provider, MD  lisinopril (PRINIVIL,ZESTRIL) 20 MG tablet Take 20 mg by mouth every morning.   Yes  Historical Provider, MD  Melaton-Thean-Cham-PassF-LBalm (MELATONIN + L-THEANINE PO) Take 1 capsule by mouth daily as needed (sleep).     Historical Provider, MD  metoCLOPramide (REGLAN) 5 MG tablet Take 1-2 tablets (5-10 mg total) by mouth every 8 (eight) hours as needed for nausea (if ondansetron (ZOFRAN) ineffective.). 10/18/13   Arlee Muslim, PA-C  ondansetron (ZOFRAN) 4 MG tablet Take 1 tablet (4 mg total) by mouth every 6 (six) hours as needed for nausea. 10/18/13   Arlee Muslim, PA-C  oxybutynin (DITROPAN) 5 MG tablet Take 5 mg by mouth 3 (three) times daily. To prevent urinary frequency   Yes Historical Provider, MD  oxyCODONE (OXY IR/ROXICODONE) 5 MG immediate release tablet Take 1-2 tablets (5-10 mg total) by mouth every 3 (three) hours as needed for moderate pain, severe pain or breakthrough pain. 10/18/13   Arlee Muslim, PA-C  polyethylene glycol [facility] / GLYCOLAX) packet Take 17 g by mouth daily as needed for mild constipation or moderate constipation. 10/18/13   Arlee Muslim, PA-C  predniSONE (DELTASONE) 5 MG tablet Take 5 mg by mouth daily with breakfast.   Yes Historical Provider, MD  rivaroxaban (XARELTO) 10 MG TABS tablet Take 1 tablet (10 mg total) by mouth daily with breakfast. Take Xarelto for two and a half more weeks, then discontinue Xarelto. Once the patient has completed the blood thinner regimen, then take a Baby 81 mg Aspirin daily for three more weeks. 10/18/13   Arlee Muslim, PA-C  traMADol (ULTRAM) 50 MG tablet Take 1-2 tablets (50-100 mg total) by mouth every 6 (six) hours as needed (mild pain). 10/18/13   Arlee Muslim, PA-C  Vitamin D, Cholecalciferol, 1000 UNITS TABS Take 1,000 Units by mouth daily.     Historical Provider, MD   Physical Exam: Blood pressure 131/63, pulse 69, temperature 98.6 F (37 C), temperature source Oral, resp. rate 16, height 5\' 6"  (1.676 m), weight 68.947 kg (152 lb), SpO2 98.00%. Filed Vitals:   10/19/13 1502  BP:   Pulse:   Temp:   Resp: 16     General:  Moderately built and nourished pleasant elderly female sitting up comfortably on chair this morning.  Eyes: Pupils equally reacting to light and accommodation.  ENT: Oral mucosa mildly dry. Otherwise no acute findings.  Neck: Supple. No JVD, carotid bruits or thyromegaly.  Cardiovascular: S1 and S2 heard, RRR. No JVD, murmurs or pedal edema.  Respiratory: Clear to auscultation. No increased work of breathing.  Abdomen: Nondistended, soft and nontender. Normal bowel sounds heard. No organomegaly or masses appreciated.  Skin: No acute findings.  Musculoskeletal: No acute findings.  Psychiatric: Pleasant and cooperative.  Neurologic: Alert and oriented x3. No focal neurological deficits.  Extremities: Symmetric 5 x 5 power. Bilateral knee-high compression stockings. Left knee with ice  pack.  Labs on Admission:  Basic Metabolic Panel:  Recent Labs Lab 10/16/13 0456 10/17/13 0527 10/19/13 0525  NA 139 138 136*  K 4.3 4.2 3.9  CL 102 99 98  CO2 25 26 29   GLUCOSE 164* 120* 109*  BUN 14 14 16   CREATININE 0.81 0.82 0.84  CALCIUM 9.2 9.3 9.1   Liver Function Tests: No results found for this basename: AST, ALT, ALKPHOS, BILITOT, PROT, ALBUMIN,  in the last 168 hours No results found for this basename: LIPASE, AMYLASE,  in the last 168 hours No results found for this basename: AMMONIA,  in the last 168 hours CBC:  Recent Labs Lab 10/16/13 0456 10/17/13 0527 10/18/13 0454 10/19/13 0525  WBC 12.1* 11.6* 9.0 10.1  HGB 9.5* 9.0* 8.4* 11.1*  HCT 29.4* 27.9* 25.9* 33.0*  MCV 83.8 81.6 81.4 82.5  PLT 205 148* 189 211   Cardiac Enzymes: No results found for this basename: CKTOTAL, CKMB, CKMBINDEX, TROPONINI,  in the last 168 hours BNP: No components found with this basename: POCBNP,  CBG: No results found for this basename: GLUCAP,  in the last 168 hours  Radiological Exams on Admission: No results found.  EKG: SR with PAC's, normal axis, LVH with repolarization changes, Q waves in leads V1-2. QTC: 452 ms. No acute changes.  Time spent: 60 minutes  [facility] Pager [phone]  If 7PM-7AM, please contact night-coverage [email or web address] Password [facility] [phone] 10/19/2013, 3:16 PM

## 2013-10-19 NOTE — Progress Notes (Signed)
Subjective: 4 Days Post-Op Procedure(s) (LRB): LEFT TOTAL KNEE ARTHROPLASTY (Left) Patient reports pain as mild.   Patient seen in rounds with Dr. Wynelle Link.  She received blood yesterday for symptomatic anemia with a HGB of 8.4.  Rapid Response was called yesterday for a vagal like episode yesterday sitting on the commode. She was planning on going to SNF but the transfer was cancelled and given blood. HGB back up today at 11.1. She had to have her foley placed back in two days ago for urinary retention and did voiding trial yesterday.  She was able to void on her own yesterday but still retaining urine.  Her post void residual on the bladder scan showed about 600 as per the night RN. She felt "sick" again when attempted up earlier this morning.  Send in rounds with Dr. Wynelle Link.  Due to the issues she is experiencing, Dr. Wynelle Link will keep her today and get a medicine consult for evaluation of the patient.  Patient is having problems with hypotension issues and urinary retention issues. On chronic prednisone.  Given IV decadron intra op and place on tapering prednisone postop. Initially hold ACEI postop but resumed due to the elevated pressures on day one. Plan is to go Skilled nursing facility after hospital stay and after she is medical stable.  Objective: Vital signs in last 24 hours: Temp:  [97.8 F (36.6 C)-98.8 F (37.1 C)] 98.8 F (37.1 C) (07/17 0553) Pulse Rate:  [75-119] 84 (07/17 0553) Resp:  [14-18] 14 (07/17 0737) BP: (70-170)/(39-97) 142/97 mmHg (07/17 0554) SpO2:  [97 %-100 %] 100 % (07/17 0737)  Intake/Output from previous day:  Intake/Output Summary (Last 24 hours) at 10/19/13 0807 Last data filed at 10/19/13 0700  Gross per 24 hour  Intake 1424.17 ml  Output   2200 ml  Net -775.83 ml     Labs:  Recent Labs  10/17/13 0527 10/18/13 0454 10/19/13 0525  HGB 9.0* 8.4* 11.1*    Recent Labs  10/18/13 0454 10/19/13 0525  WBC 9.0 10.1  RBC 3.18* 4.00  HCT  25.9* 33.0*  PLT 189 211    Recent Labs  10/17/13 0527 10/19/13 0525  NA 138 136*  K 4.2 3.9  CL 99 98  CO2 26 29  BUN 14 16  CREATININE 0.82 0.84  GLUCOSE 120* 109*  CALCIUM 9.3 9.1   No results found for this basename: LABPT, INR,  in the last 72 hours  EXAM General - Patient is Alert, Appropriate and Oriented Extremity - Neurovascular intact Sensation intact distally Dorsiflexion/Plantar flexion intact Dressing/Incision - clean, dry, no drainage Motor Function - intact, moving foot and toes well on exam.   Past Medical History  Diagnosis Date  . Hypertension   . Asthma   . Glaucoma   . Incontinence   . Wears dentures   . Wears glasses   . Thyroid disease     "something years ago"  . GERD (gastroesophageal reflux disease)   . Cancer     rt breast  . Edema leg   . Fast breathing     "because of pill"  . Pneumonia     h/o younger  . Arthritis     "all over"  . S/P total knee arthroplasty   . Glaucoma   . Swelling     bilateral feet/ legs, more left.    Assessment/Plan: 4 Days Post-Op Procedure(s) (LRB): LEFT TOTAL KNEE ARTHROPLASTY (Left) Active Problems:   Hypertension   Thyroid disease  OA (osteoarthritis) of knee   Postoperative anemia due to acute blood loss   Glaucoma   Insomnia   GERD (gastroesophageal reflux disease)   Urinary retention   Incontinence   Unspecified asthma(493.90)  Estimated body mass index is 24.55 kg/(m^2) as calculated from the following:   Height as of this encounter: 5\' 6"  (1.676 m).   Weight as of this encounter: 68.947 kg (152 lb). Up with therapy Plan for discharge tomorrow if she is doing better and medically stable Discharge to SNF - [facility] consult today.  DVT Prophylaxis - Xarelto Weight-Bearing as tolerated to left leg  Arlee Muslim, PA-C Orthopaedic Surgery 10/19/2013, 8:07 AM

## 2013-10-19 NOTE — Progress Notes (Signed)
Patient continuing to have urine retention issues.  Patient was dangled on bedside which she tolerated well.  When attempting to move to bedside commode she refused saying "I'm very sick."  When placed on bedpan she only voids small amounts.  Will talk to rounding MD.  Will continue to monitor.

## 2013-10-20 DIAGNOSIS — C50119 Malignant neoplasm of central portion of unspecified female breast: Secondary | ICD-10-CM

## 2013-10-20 DIAGNOSIS — Z96659 Presence of unspecified artificial knee joint: Secondary | ICD-10-CM

## 2013-10-20 MED ORDER — POLYSACCHARIDE IRON COMPLEX 150 MG PO CAPS
150.0000 mg | ORAL_CAPSULE | Freq: Two times a day (BID) | ORAL | Status: DC
  Administered 2013-10-20: 150 mg via ORAL
  Filled 2013-10-20 (×2): qty 1

## 2013-10-20 MED ORDER — HYDROCODONE-ACETAMINOPHEN 5-325 MG PO TABS: 1.0000 | ORAL_TABLET | ORAL | Status: DC | PRN

## 2013-10-20 MED ORDER — OXYCODONE HCL 5 MG PO TABS
5.0000 mg | ORAL_TABLET | ORAL | Status: DC | PRN
  Administered 2013-10-20 (×2): 10 mg via ORAL
  Filled 2013-10-20 (×2): qty 2

## 2013-10-20 MED ORDER — MORPHINE SULFATE 2 MG/ML IJ SOLN: 1.0000 mg | INTRAMUSCULAR | Status: DC | PRN

## 2013-10-20 MED ORDER — ACETAMINOPHEN 325 MG PO TABS
650.0000 mg | ORAL_TABLET | Freq: Three times a day (TID) | ORAL | Status: DC | PRN
  Filled 2013-10-20: qty 2

## 2013-10-20 NOTE — Progress Notes (Addendum)
CSW consult to pt regarding discharge to [facility]. CSW spoke with pt to advise her plan. Pt agreeable. CSW spoke with pt.'s family. Pt discharged with a foley in place. [facility] informed,agreed to take pt. PTAR arranged. CSW prepared packet. Pt appreciative of CSW assistance. No further CSW intervention needed.   Juliann Mule [phone]

## 2013-10-20 NOTE — Plan of Care (Signed)
Problem: Discharge Progression Outcomes Goal: Barriers To Progression Addressed/Resolved Outcome: Adequate for Discharge Urinary retention - will be discharged to snf with foley catheter. Goal: Ambulates safely using assistive device Outcome: Adequate for Discharge To snf for rehab

## 2013-10-20 NOTE — Progress Notes (Signed)
Pt voided 100 cc, PVR scan= 380 cc. Checking with CM re: will [facility] take pts with catheters. Also placed page to PA. Flonnie Wierman, [employer]

## 2013-10-20 NOTE — Progress Notes (Signed)
   Subjective: 5 Days Post-Op Procedure(s) (LRB): LEFT TOTAL KNEE ARTHROPLASTY (Left) Patient reports pain as mild.  Feels much better than yesterday. Pain decreased and no dizziness Plan is to go Skilled nursing facility after hospital stay. Will go to SNF today if she can void  Objective: Vital signs in last 24 hours: Temp:  [97.8 F (36.6 C)-98.6 F (37 C)] 98.3 F (36.8 C) (07/18 0535) Pulse Rate:  [69-118] 79 (07/18 0535) Resp:  [14-20] 20 (07/18 0535) BP: (124-147)/(63-73) 147/71 mmHg (07/18 0535) SpO2:  [96 %-100 %] 100 % (07/18 0535)  Intake/Output from previous day:  Intake/Output Summary (Last 24 hours) at 10/20/13 0735 Last data filed at 10/20/13 0536  Gross per 24 hour  Intake    720 ml  Output   1250 ml  Net   -530 ml    Intake/Output this shift:    Labs:  Recent Labs  10/18/13 0454 10/19/13 0525  HGB 8.4* 11.1*    Recent Labs  10/18/13 0454 10/19/13 0525  WBC 9.0 10.1  RBC 3.18* 4.00  HCT 25.9* 33.0*  PLT 189 211    Recent Labs  10/19/13 0525  NA 136*  K 3.9  CL 98  CO2 29  BUN 16  CREATININE 0.84  GLUCOSE 109*  CALCIUM 9.1   No results found for this basename: LABPT, INR,  in the last 72 hours  EXAM General - Patient is Alert, Appropriate and Oriented Extremity - Neurologically intact Neurovascular intact Incision: dressing C/D/I No cellulitis present Compartment soft Dressing/Incision - clean, dry, no drainage Motor Function - intact, moving foot and toes well on exam.   Past Medical History  Diagnosis Date  . Hypertension   . Asthma   . Glaucoma   . Incontinence   . Wears dentures   . Wears glasses   . Thyroid disease     "something years ago"  . GERD (gastroesophageal reflux disease)   . Cancer     rt breast  . Edema leg   . Fast breathing     "because of pill"  . Pneumonia     h/o younger  . Arthritis     "all over"  . S/P total knee arthroplasty   . Glaucoma   . Swelling     bilateral feet/ legs, more  left.    Assessment/Plan: 5 Days Post-Op Procedure(s) (LRB): LEFT TOTAL KNEE ARTHROPLASTY (Left) Active Problems:   Hypertension   Thyroid disease   OA (osteoarthritis) of knee   Postoperative anemia due to acute blood loss   Glaucoma   Insomnia   GERD (gastroesophageal reflux disease)   Urinary retention   Incontinence   Unspecified asthma(493.90)   Syncope   Hypotension, unspecified   Dehydration   Discharge to SNF if she can void  DVT Prophylaxis - Xarelto Weight-Bearing as tolerated to left leg  Genasis Zingale V 10/20/2013, 7:35 AM

## 2013-10-20 NOTE — Progress Notes (Signed)
Discharged from floor via stretcher, EMT with pt. No changes in assessment. Report phoned to [facility]. Cherry Wittwer, [employer]

## 2013-10-20 NOTE — Plan of Care (Signed)
Problem: Discharge Progression Outcomes Goal: Discharge plan in place and appropriate Outcome: Completed/Met Date Met:  10/20/13 SNF     

## 2013-10-20 NOTE — Progress Notes (Signed)
[facility] PROGRESS NOTE  Natasha Fletcher EGB:[id number] DOB: [birth year] DOA: 10/15/2013 PCP: Myriam Jacobson, MD  Assessment/Plan: Syncope/near syncope: Patient complains of some dizziness and lightheadedness on standing. On 10/18/13, she was sitting on bedside commode with PT/OT and felt weak, systolic blood pressure in the 70s and nursing staff noted eyes rolled back. Rapid response was called and patient promptly recovered. This was likely multifactorial secondary to hypotension and vasovagal from pain and using bedside commode.  -patient is not longer orthostatic and denies any further episodes of dizziness or lightheadedness -recommends maintaining good hydration -minimize pain medications as much as possible and consider thigh high Ted stockings while ambulating.   Transient Hypotension: Secondary to dehydration from poor oral intake and acute blood loss anemia. Resolved after 2 units of PRBC transfusion.  Dehydration: Secondary to poor oral intake. Improved/resolved after IVF's given -patient advise to keep herself well hydrated  Urinary retention: Likely secondary to immobility, pain and narcotic pain medications. Mobilize patient and minimize opioids as much as possible. If problem persists, please consult patient's primary urologist Dr. Luberta Robertson. Continue oxybutynin. Urine microscopy negative for UTI. -maintain good hydration   Hypertension: Controlled. No further orthostatic changes appreciated. Patient is not dissy or lightheaded -continue current antihypertensive regimen -recommending heart healthy diet at discharge   Acute blood loss anemia: Status post 2 units of PRBC transfusion. Improved.  -continue niferex BID  History of breast cancer: Continue Arimidex.   Constipation: Continue bowel regimen.   Chronic prednisone: Unclear indication. Continue prednisone for now. Will benefit of slow tapering as an outpatient   Code Status: Full Family Communication:  no family at bedside Disposition Plan: ok to discharge from IM standpoint; please contact us with any other concerns. Will sign off.   Procedures:  Left TKA  Antibiotics:  None   HPI/Subjective: Afebrile, feeling better. Denies any further episodes of dizziness or lightheadedness. Patient still with pain in her left knee (especially with movement)  Objective: Filed Vitals:   10/20/13 0535  BP: 147/71  Pulse: 79  Temp: 98.3 F (36.8 C)  Resp: 20    Intake/Output Summary (Last 24 hours) at 10/20/13 0925 Last data filed at 10/20/13 0811  Gross per 24 hour  Intake    480 ml  Output   1350 ml  Net   -870 ml   Filed Weights   10/15/13 0740 10/15/13 1259  Weight: 68.947 kg (152 lb) 68.947 kg (152 lb)    Exam:   General:  AAOX3, afebrile; no dizziness, no palpitations, no CP; complaining of pain on her left knee only.  Cardiovascular: regular Rate, no rubs, no gallops  Respiratory: CTA bilaterally  Abdomen: soft, NT, ND, positive BS  Musculoskeletal: no cyanosis or clubbing; left knee dressings clean, dry and w/o apparent drainage from wound  Data Reviewed: Basic Metabolic Panel:  Recent Labs Lab 10/16/13 0456 10/17/13 0527 10/19/13 0525  NA 139 138 136*  K 4.3 4.2 3.9  CL 102 99 98  CO2 25 26 29   GLUCOSE 164* 120* 109*  BUN 14 14 16   CREATININE 0.81 0.82 0.84  CALCIUM 9.2 9.3 9.1   CBC:  Recent Labs Lab 10/16/13 0456 10/17/13 0527 10/18/13 0454 10/19/13 0525  WBC 12.1* 11.6* 9.0 10.1  HGB 9.5* 9.0* 8.4* 11.1*  HCT 29.4* 27.9* 25.9* 33.0*  MCV 83.8 81.6 81.4 82.5  PLT 205 148* 189 211     Studies: No results found.  Scheduled Meds: . anastrozole  1 mg Oral Daily  .  docusate sodium  100 mg Oral BID  . furosemide  20 mg Oral q morning - 10a  . iron polysaccharides  150 mg Oral BID  . latanoprost  1 drop Both Eyes QHS  . lisinopril  20 mg Oral q morning - 10a  . oxybutynin  5 mg Oral TID  . predniSONE  5 mg Oral Q breakfast  .  rivaroxaban  10 mg Oral Q breakfast   Continuous Infusions:   Active Problems:   Hypertension   Thyroid disease   OA (osteoarthritis) of knee   Postoperative anemia due to acute blood loss   Glaucoma   Insomnia   GERD (gastroesophageal reflux disease)   Urinary retention   Incontinence   Unspecified asthma(493.90)   Syncope   Hypotension, unspecified   Dehydration    Time spent: <30 minutes    Barton Dubois  [facility] Pager [phone]. If 7PM-7AM, please contact night-coverage at [email or web address], password [facility] 10/20/2013, 9:25 AM  LOS: 5 days

## 2013-10-20 NOTE — Progress Notes (Signed)
CM confirmed that [facility] will take pts with foley catheters. Natasha Fletcher, [place], returned call & ordered foley reinserted. Sharonann Malbrough, [employer]

## 2013-10-20 NOTE — Progress Notes (Signed)
Physical Therapy Treatment Patient Details Name: Natasha Fletcher MRN: [id number] DOB: 06-25-1925 Today's Date: 10/20/2013    History of Present Illness s/p L TKA     PT Comments    Pt progressing slowly 2* pain but with no complaints of dizziness with position change.  BP in standing 169/72  Follow Up Recommendations  SNF     Equipment Recommendations  None recommended by PT    Recommendations for Other Services       Precautions / Restrictions Precautions Precautions: Fall Knee Immobilizer - Left: Discontinue once straight leg raise with < 10 degree lag Restrictions Weight Bearing Restrictions: No Other Position/Activity Restrictions: WBAT    Mobility  Bed Mobility Overal bed mobility: Needs Assistance Bed Mobility: Supine to Sit;Sit to Supine     Supine to sit: +2 for safety/equipment;+2 for physical assistance;Mod assist Sit to supine: +2 for physical assistance;Max assist   General bed mobility comments: assist for L LE over to EOB and some assist for trunk to upright.   Transfers Overall transfer level: Needs assistance Equipment used: Rolling walker (2 wheeled) Transfers: Sit to/from Stand Sit to Stand: +2 physical assistance;+2 safety/equipment;Mod assist Stand pivot transfers:  (NT - pt refuses to attempt amb or transfer to chair/BSC)       General transfer comment: Pt stood for BP to be taken  Ambulation/Gait Ambulation/Gait assistance:  (NT - pt declines 2* pain)               Stairs            Wheelchair Mobility    Modified Rankin (Stroke Patients Only)       Balance                                    Cognition Arousal/Alertness: Awake/alert Behavior During Therapy: WFL for tasks assessed/performed Overall Cognitive Status: Within Functional Limits for tasks assessed                      Exercises Total Joint Exercises Ankle Circles/Pumps: AROM;Both;15 reps;Supine Quad Sets: AROM;Both;15  reps;Supine Heel Slides: AROM;10 reps;15 reps;Supine;Left Straight Leg Raises: AAROM;Left;15 reps;Supine    General Comments        Pertinent Vitals/Pain 9/10 with OOB activity; RN aware; ice packs provided    Home Living                      Prior Function            PT Goals (current goals can now be found in the care plan section) Acute Rehab PT Goals Patient Stated Goal: to go to rehab PT Goal Formulation: With patient Time For Goal Achievement: 10/23/13 Potential to Achieve Goals: Good Progress towards PT goals: Progressing toward goals    Frequency  7X/week    PT Plan Current plan remains appropriate    Co-evaluation             End of Session Equipment Utilized During Treatment: Gait belt;Left knee immobilizer Activity Tolerance: Patient limited by fatigue;Patient limited by pain Patient left: in bed;with call bell/phone within reach;with nursing/sitter in room     Time: 6606-3016 PT Time Calculation (min): 32 min  Charges:  $Therapeutic Exercise: 8-22 mins $Therapeutic Activity: 8-22 mins                    G Codes:  Sharran Caratachea 10/20/2013, 12:34 PM

## 2013-10-20 NOTE — Progress Notes (Signed)
Pt voided 150 cc on bedpan, PVR scan showed 375 cc remaining. Pt assisted onto [facility] & voided another 75 cc with PVR=250 cc. [place], [place], notified & she said to keep pt 2 more hours to see if she can improve emptying. SW notified about this. Charniece Venturino, [employer]

## 2013-10-23 ENCOUNTER — Non-Acute Institutional Stay (SKILLED_NURSING_FACILITY): Payer: Medicare Other | Admitting: Internal Medicine

## 2013-10-23 DIAGNOSIS — M1710 Unilateral primary osteoarthritis, unspecified knee: Secondary | ICD-10-CM

## 2013-10-23 DIAGNOSIS — K5900 Constipation, unspecified: Secondary | ICD-10-CM

## 2013-10-23 DIAGNOSIS — I10 Essential (primary) hypertension: Secondary | ICD-10-CM

## 2013-10-23 DIAGNOSIS — D62 Acute posthemorrhagic anemia: Secondary | ICD-10-CM

## 2013-10-23 DIAGNOSIS — M1712 Unilateral primary osteoarthritis, left knee: Secondary | ICD-10-CM

## 2013-10-23 NOTE — Progress Notes (Signed)
HISTORY & PHYSICAL  DATE: 10/23/2013   FACILITY: [facility]  LEVEL OF CARE: SNF (31)  ALLERGIES:  Allergies  Allergen Reactions  . Milk-Related Compounds Other (See Comments)    Upset stomach   . Sulfa Antibiotics Other (See Comments)    Reaction many years ago-unknown    CHIEF COMPLAINT:  Manage left knee osteoarthritis, acute blood loss anemia and hypertension  HISTORY OF PRESENT ILLNESS: Patient is an 80 year old African American female.  KNEE OSTEOARTHRITIS: Patient had a history of pain and functional disability in the knee due to end-stage osteoarthritis and has failed nonsurgical conservative treatments. Patient had worsening of pain with activity and weight bearing, pain that interfered with activities of daily living & pain with passive range of motion. Therefore patient underwent total knee arthroplasty and tolerated the procedure well. Patient is admitted to this facility for sort short-term rehabilitation. Patient denies knee pain.  ANEMIA: The anemia has been stable. The patient denies fatigue, melena or hematochezia. No complications from the medications currently being used. Postoperatively the patient suffered acute blood loss. Last hemoglobin level 9.5  HTN: Pt 's HTN remains stable.  Denies CP, sob, DOE, pedal edema, headaches, dizziness or visual disturbances.  No complications from the medications currently being used.  Last BP : 137/67.  PAST MEDICAL HISTORY :  Past Medical History  Diagnosis Date  . Hypertension   . Asthma   . Glaucoma   . Incontinence   . Wears dentures   . Wears glasses   . Thyroid disease     "something years ago"  . GERD (gastroesophageal reflux disease)   . Cancer     rt breast  . Edema leg   . Fast breathing     "because of pill"  . Pneumonia     h/o younger  . Arthritis     "all over"  . S/P total knee arthroplasty   . Glaucoma   . Swelling     bilateral feet/ legs, more left.    PAST  SURGICAL HISTORY: Past Surgical History  Procedure Laterality Date  . Total hip arthroplasty Bilateral   . Shoulder surgery Left   . Appendectomy    . Carpal tunnel release Bilateral   . Tonsillectomy    . Colonoscopy    . Abdominal hysterectomy    . Eye surgery      both cataracts  . Breast biopsy  04/27/2011    Procedure: BREAST BIOPSY WITH NEEDLE LOCALIZATION;  Surgeon: Edward Jolly, MD;  Location: [facility] [facility];  Service: General;  Laterality: Right;  right needle localized breast lumpectomy   . Breast lumpectomy  04/27/11    right breast by [place]  . Total knee arthroplasty Right 02/19/2013    Procedure: RIGHT TOTAL KNEE ARTHROPLASTY;  Surgeon: Gearlean Alf, MD;  Location: [facility];  Service: Orthopedics;  Laterality: Right;  . Total knee arthroplasty Left 10/15/2013    Procedure: LEFT TOTAL KNEE ARTHROPLASTY;  Surgeon: Gearlean Alf, MD;  Location: [facility];  Service: Orthopedics;  Laterality: Left;    SOCIAL HISTORY:  reports that she quit smoking about 38 years ago. Her smoking use included Cigarettes. She has a 15 pack-year smoking history. She has never used smokeless tobacco. She reports that she does not drink alcohol or use illicit drugs.  FAMILY HISTORY:  Family History  Problem Relation Age of Onset  . Heart disease Mother   . Stroke  Father     CURRENT MEDICATIONS: Reviewed per MAR/see medication list  REVIEW OF SYSTEMS:  See HPI otherwise 14 point ROS is negative.  PHYSICAL EXAMINATION  VS:  See VS section  GENERAL: no acute distress, normal body habitus EYES: conjunctivae normal, sclerae normal, normal eye lids MOUTH/THROAT: lips without lesions,no lesions in the mouth,tongue is without lesions,uvula elevates in midline NECK: supple, trachea midline, no neck masses, no thyroid tenderness, no thyromegaly LYMPHATICS: no LAN in the neck, no supraclavicular LAN RESPIRATORY: breathing is even & unlabored, BS CTAB CARDIAC: RRR, no murmur,no  extra heart sounds, no edema GI:  ABDOMEN: abdomen soft, normal BS, no masses, no tenderness  LIVER/SPLEEN: no hepatomegaly, no splenomegaly MUSCULOSKELETAL: HEAD: normal to inspection  EXTREMITIES: LEFT UPPER EXTREMITY: Moderate range of motion, normal strength & tone RIGHT UPPER EXTREMITY: Moderate range of motion, normal strength & tone LEFT LOWER EXTREMITY:  range of motion not tested due to surgery, normal strength & tone RIGHT LOWER EXTREMITY: Moderate range of motion, normal strength & tone PSYCHIATRIC: the patient is alert & oriented to person, affect & behavior appropriate  LABS/RADIOLOGY:  Labs reviewed: Basic Metabolic Panel:  Recent Labs  10/16/13 0456 10/17/13 0527 10/19/13 0525  NA 139 138 136*  K 4.3 4.2 3.9  CL 102 99 98  CO2 25 26 29   GLUCOSE 164* 120* 109*  BUN 14 14 16   CREATININE 0.81 0.82 0.84  CALCIUM 9.2 9.3 9.1   Liver Function Tests:  Recent Labs  02/15/13 1150 07/11/13 1330 10/02/13 1005  AST 20 18 20   ALT 14 9 13   ALKPHOS 106 103 95  BILITOT 0.8 0.7 0.8  PROT 7.2 7.4 6.9  ALBUMIN 3.5 3.9 4.0   CBC:  Recent Labs  07/11/13 1330  10/17/13 0527 10/18/13 0454 10/19/13 0525  WBC 4.5  < > 11.6* 9.0 10.1  NEUTROABS 2.6  --   --   --   --   HGB 11.4*  < > 9.0* 8.4* 11.1*  HCT 35.4*  < > 27.9* 25.9* 33.0*  MCV 80.8  < > 81.6 81.4 82.5  PLT 226  < > 148* 189 211  < > = values in this interval not displayed.   ASSESSMENT/PLAN:  Left knee osteoarthritis-status post total knee arthroplasty. Continue rehabilitation. Acute blood loss anemia-continue iron. Check hemoglobin level. Hypertension-well-controlled Constipation-continue MiraLax Urinary incontinence-continue Ditropan Stroke breast cancer-continue Arimidex  I have reviewed patient's medical records received at admission/from hospitalization.  CPT CODE: 44818  Sendy [address] [place], [place] [phone]

## 2013-10-29 ENCOUNTER — Encounter: Payer: Self-pay | Admitting: Adult Health

## 2013-10-29 ENCOUNTER — Non-Acute Institutional Stay (SKILLED_NURSING_FACILITY): Payer: Medicare Other | Admitting: Adult Health

## 2013-10-29 DIAGNOSIS — N390 Urinary tract infection, site not specified: Secondary | ICD-10-CM

## 2013-10-29 NOTE — Progress Notes (Signed)
Patient ID: Natasha Fletcher, female   DOB: May 14, 1925, 80 y.o.   MRN: [id number]         PROGRESS NOTE  DATE: 10/29/2013  FACILITY:  [facility]  LEVEL OF CARE: SNF (31)  Acute Visit  CHIEF COMPLAINT:  Manage UTI  HISTORY OF PRESENT ILLNESS: This is an 80 year old female who has Osteoarthritis S/P Left total knee arthroplasty. She has been admitted to [facility] on  10/20/13 from [facility] for a short-term rehabilitation. She has foley catheter upon admission due to urinary retention. Foley catheter was recently discontinued and urine culture shows > 100,000 CFU/ml E. Coli. No hematuria nor fever.   PAST MEDICAL HISTORY : Reviewed.  No changes/see problem list  CURRENT MEDICATIONS: Reviewed per MAR/see medication list  REVIEW OF SYSTEMS:  GENERAL: no change in appetite, no fatigue, no weight changes, no fever, chills or weakness RESPIRATORY: no cough, SOB, DOE,, wheezing, hemoptysis CARDIAC: no chest pain, edema or palpitations GI: no abdominal pain, diarrhea, constipation, heart burn, nausea or vomiting  PHYSICAL EXAMINATION  GENERAL: no acute distress, normal body habitus EYES: conjunctivae normal, sclerae normal, normal eye lids NECK: supple, trachea midline, no neck masses, no thyroid tenderness, no thyromegaly LYMPHATICS: no LAN in the neck, no supraclavicular LAN RESPIRATORY: breathing is even & unlabored, BS CTAB CARDIAC: RRR, no murmur,no extra heart sounds, no edema GI: abdomen soft, normal BS, no masses, no tenderness, no hepatomegaly, no splenomegaly EXTREMITIES:  Able to move all 4 extremities; has LLE immobilizer PSYCHIATRIC: the patient is alert & oriented to person, affect & behavior appropriate  LABS/RADIOLOGY: 10/24/13  Wbc 8.7  hgb 10.1  hct 32.8 Labs reviewed: Basic Metabolic Panel:  Recent Labs  10/16/13 0456 10/17/13 0527 10/19/13 0525  NA 139 138 136*  K 4.3 4.2 3.9  CL 102 99 98  CO2 25 26 29   GLUCOSE 164* 120*  109*  BUN 14 14 16   CREATININE 0.81 0.82 0.84  CALCIUM 9.2 9.3 9.1   Liver Function Tests:  Recent Labs  02/15/13 1150 07/11/13 1330 10/02/13 1005  AST 20 18 20   ALT 14 9 13   ALKPHOS 106 103 95  BILITOT 0.8 0.7 0.8  PROT 7.2 7.4 6.9  ALBUMIN 3.5 3.9 4.0   CBC:  Recent Labs  07/11/13 1330  10/17/13 0527 10/18/13 0454 10/19/13 0525  WBC 4.5  < > 11.6* 9.0 10.1  NEUTROABS 2.6  --   --   --   --   HGB 11.4*  < > 9.0* 8.4* 11.1*  HCT 35.4*  < > 27.9* 25.9* 33.0*  MCV 80.8  < > 81.6 81.4 82.5  PLT 226  < > 148* 189 211  < > = values in this interval not displayed.   ASSESSMENT/PLAN:  UTI - start Macrobid 100 mg PO BID X 7 days  CPT CODE: 51884  Seth Bake -  NP [facility] [phone]

## 2013-11-01 ENCOUNTER — Encounter: Payer: Self-pay | Admitting: Adult Health

## 2013-11-01 ENCOUNTER — Non-Acute Institutional Stay (SKILLED_NURSING_FACILITY): Payer: Medicare Other | Admitting: Adult Health

## 2013-11-01 DIAGNOSIS — D62 Acute posthemorrhagic anemia: Secondary | ICD-10-CM

## 2013-11-01 DIAGNOSIS — M1710 Unilateral primary osteoarthritis, unspecified knee: Secondary | ICD-10-CM

## 2013-11-01 DIAGNOSIS — G4700 Insomnia, unspecified: Secondary | ICD-10-CM

## 2013-11-01 DIAGNOSIS — K5900 Constipation, unspecified: Secondary | ICD-10-CM

## 2013-11-01 DIAGNOSIS — I10 Essential (primary) hypertension: Secondary | ICD-10-CM

## 2013-11-01 DIAGNOSIS — R32 Unspecified urinary incontinence: Secondary | ICD-10-CM

## 2013-11-01 DIAGNOSIS — M1712 Unilateral primary osteoarthritis, left knee: Secondary | ICD-10-CM

## 2013-11-01 NOTE — Progress Notes (Signed)
Patient ID: Natasha Fletcher, female   DOB: 10-31-1925, 80 y.o.   MRN: [id number]         PROGRESS NOTE  DATE:  11/01/13  FACILITY:  [facility]  LEVEL OF CARE: SNF (31)  Acute Visit  CHIEF COMPLAINT:  Discharge Notes  HISTORY OF PRESENT ILLNESS: This is an 80 year old female who is for discharge home with Home health PT, OT, Nursing, CNA and Education officer, museum. DME: wheelchair with leg rests and cushion and bedside commode. She has been admitted to [facility] from [facility] on 10/20/13 with  Osteoarthritis S/P Left total knee arthroplasty. Patient was admitted to this facility for short-term rehabilitation after the patient's recent hospitalization.  Patient has completed SNF rehabilitation and therapy has cleared the patient for discharge.  RE-ASSESSMENT OF ONGOING PROBLEMS:   HTN: Pt 's HTN remains stable.  Denies CP, sob, DOE, pedal edema, headaches, dizziness or visual disturbances.  No complications from the medications currently being used.  Last BP : 140/82  ANEMIA: The anemia has been stable. The patient denies fatigue, melena or hematochezia. No complications from the medications currently being used. 7/15 hgb 10.1  CONSTIPATION: The constipation remains stable. No complications from the medications presently being used. Patient denies ongoing constipation, abdominal pain, nausea or vomiting.    PAST MEDICAL HISTORY : Reviewed.  No changes/see problem list  CURRENT MEDICATIONS: Reviewed per MAR/see medication list  REVIEW OF SYSTEMS:  GENERAL: no change in appetite, no fatigue, no weight changes, no fever, chills or weakness RESPIRATORY: no cough, SOB, DOE, wheezing, hemoptysis CARDIAC: no chest pain, edema or palpitations GI: no abdominal pain, diarrhea, constipation, heart burn  PHYSICAL EXAMINATION  GENERAL: no acute distress, normal body habitus NECK: supple, trachea midline, no neck masses, no thyroid tenderness, no thyromegaly LYMPHATICS:  no LAN in the neck, no supraclavicular LAN RESPIRATORY: breathing is even & unlabored, BS CTAB CARDIAC: RRR, no murmur,no extra heart sounds, no edema GI: abdomen soft, normal BS, no masses, no tenderness, no hepatomegaly, no splenomegaly EXTREMITIES:  Able to move all 4 extremities PSYCHIATRIC: the patient is alert & oriented to person, affect & behavior appropriate  LABS/RADIOLOGY: 10/24/13  Wbc 8.7  hgb 10.1  hct 32.8 Labs reviewed: Basic Metabolic Panel:  Recent Labs  10/16/13 0456 10/17/13 0527 10/19/13 0525  NA 139 138 136*  K 4.3 4.2 3.9  CL 102 99 98  CO2 25 26 29   GLUCOSE 164* 120* 109*  BUN 14 14 16   CREATININE 0.81 0.82 0.84  CALCIUM 9.2 9.3 9.1   Liver Function Tests:  Recent Labs  02/15/13 1150 07/11/13 1330 10/02/13 1005  AST 20 18 20   ALT 14 9 13   ALKPHOS 106 103 95  BILITOT 0.8 0.7 0.8  PROT 7.2 7.4 6.9  ALBUMIN 3.5 3.9 4.0   CBC:  Recent Labs  07/11/13 1330  10/17/13 0527 10/18/13 0454 10/19/13 0525  WBC 4.5  < > 11.6* 9.0 10.1  NEUTROABS 2.6  --   --   --   --   HGB 11.4*  < > 9.0* 8.4* 11.1*  HCT 35.4*  < > 27.9* 25.9* 33.0*  MCV 80.8  < > 81.6 81.4 82.5  PLT 226  < > 148* 189 211  < > = values in this interval not displayed.   ASSESSMENT/PLAN:  Osteoarthritis status post left total knee arthroplasty - for home health PT, OT, nursing, CNA and social worker Hypertension - continue  lisinopril Anemia, acute blood loss -  stable; continue Niferex Constipation - stable; continue MiraLax and Senokot  Urinary incontinence - continued Ditropan Insomnia - stable; continue Flurazepam PRN   I have filled out patient's discharge paperwork and written prescriptions.  Patient will receive home health PT, OT, SW, Nursing and CNA.  DME provided: wheelchair with leg rests and cushion and bedside commode.  Total discharge time: Greater than 30 minutes  Discharge time involved coordination of the discharge process with social worker, nursing  staff and therapy department. Medical justification for home health services/DME verified.    CPT CODE: 44315  Seth Bake -  NP [facility] [phone]

## 2013-11-05 ENCOUNTER — Encounter (HOSPITAL_COMMUNITY): Payer: Self-pay | Admitting: Internal Medicine

## 2013-11-05 ENCOUNTER — Inpatient Hospital Stay (HOSPITAL_COMMUNITY): Payer: Medicare Other

## 2013-11-05 ENCOUNTER — Inpatient Hospital Stay (HOSPITAL_COMMUNITY)
Admission: EM | Admit: 2013-11-05 | Discharge: 2013-11-08 | DRG: 871 | Disposition: A | Discharge status: Skilled Nursing Facility | Payer: Medicare Other | Attending: Internal Medicine | Admitting: Internal Medicine

## 2013-11-05 ENCOUNTER — Emergency Department (HOSPITAL_COMMUNITY): Payer: Medicare Other

## 2013-11-05 DIAGNOSIS — Z8249 Family history of ischemic heart disease and other diseases of the circulatory system: Secondary | ICD-10-CM

## 2013-11-05 DIAGNOSIS — K219 Gastro-esophageal reflux disease without esophagitis: Secondary | ICD-10-CM | POA: Diagnosis present

## 2013-11-05 DIAGNOSIS — M25569 Pain in unspecified knee: Secondary | ICD-10-CM

## 2013-11-05 DIAGNOSIS — J45909 Unspecified asthma, uncomplicated: Secondary | ICD-10-CM | POA: Diagnosis present

## 2013-11-05 DIAGNOSIS — Z96649 Presence of unspecified artificial hip joint: Secondary | ICD-10-CM | POA: Diagnosis not present

## 2013-11-05 DIAGNOSIS — G4700 Insomnia, unspecified: Secondary | ICD-10-CM

## 2013-11-05 DIAGNOSIS — Z853 Personal history of malignant neoplasm of breast: Secondary | ICD-10-CM

## 2013-11-05 DIAGNOSIS — E872 Acidosis, unspecified: Secondary | ICD-10-CM | POA: Diagnosis present

## 2013-11-05 DIAGNOSIS — N179 Acute kidney failure, unspecified: Secondary | ICD-10-CM | POA: Diagnosis present

## 2013-11-05 DIAGNOSIS — E871 Hypo-osmolality and hyponatremia: Secondary | ICD-10-CM | POA: Diagnosis present

## 2013-11-05 DIAGNOSIS — N19 Unspecified kidney failure: Secondary | ICD-10-CM | POA: Diagnosis present

## 2013-11-05 DIAGNOSIS — K920 Hematemesis: Secondary | ICD-10-CM

## 2013-11-05 DIAGNOSIS — E079 Disorder of thyroid, unspecified: Secondary | ICD-10-CM

## 2013-11-05 DIAGNOSIS — Z87891 Personal history of nicotine dependence: Secondary | ICD-10-CM

## 2013-11-05 DIAGNOSIS — E876 Hypokalemia: Secondary | ICD-10-CM

## 2013-11-05 DIAGNOSIS — N390 Urinary tract infection, site not specified: Secondary | ICD-10-CM | POA: Diagnosis present

## 2013-11-05 DIAGNOSIS — N393 Stress incontinence (female) (male): Secondary | ICD-10-CM

## 2013-11-05 DIAGNOSIS — E43 Unspecified severe protein-calorie malnutrition: Secondary | ICD-10-CM | POA: Diagnosis present

## 2013-11-05 DIAGNOSIS — Z96659 Presence of unspecified artificial knee joint: Secondary | ICD-10-CM

## 2013-11-05 DIAGNOSIS — M069 Rheumatoid arthritis, unspecified: Secondary | ICD-10-CM | POA: Diagnosis present

## 2013-11-05 DIAGNOSIS — H409 Unspecified glaucoma: Secondary | ICD-10-CM

## 2013-11-05 DIAGNOSIS — C50119 Malignant neoplasm of central portion of unspecified female breast: Secondary | ICD-10-CM

## 2013-11-05 DIAGNOSIS — Z66 Do not resuscitate: Secondary | ICD-10-CM | POA: Diagnosis present

## 2013-11-05 DIAGNOSIS — K226 Gastro-esophageal laceration-hemorrhage syndrome: Secondary | ICD-10-CM | POA: Diagnosis present

## 2013-11-05 DIAGNOSIS — A419 Sepsis, unspecified organism: Secondary | ICD-10-CM | POA: Diagnosis present

## 2013-11-05 DIAGNOSIS — N178 Other acute kidney failure: Secondary | ICD-10-CM

## 2013-11-05 DIAGNOSIS — D649 Anemia, unspecified: Secondary | ICD-10-CM | POA: Diagnosis present

## 2013-11-05 DIAGNOSIS — R6520 Severe sepsis without septic shock: Secondary | ICD-10-CM | POA: Diagnosis present

## 2013-11-05 DIAGNOSIS — K5900 Constipation, unspecified: Secondary | ICD-10-CM

## 2013-11-05 DIAGNOSIS — G9341 Metabolic encephalopathy: Secondary | ICD-10-CM | POA: Diagnosis present

## 2013-11-05 DIAGNOSIS — D62 Acute posthemorrhagic anemia: Secondary | ICD-10-CM

## 2013-11-05 DIAGNOSIS — I959 Hypotension, unspecified: Secondary | ICD-10-CM

## 2013-11-05 DIAGNOSIS — I10 Essential (primary) hypertension: Secondary | ICD-10-CM | POA: Diagnosis present

## 2013-11-05 DIAGNOSIS — E875 Hyperkalemia: Secondary | ICD-10-CM | POA: Diagnosis present

## 2013-11-05 DIAGNOSIS — R112 Nausea with vomiting, unspecified: Secondary | ICD-10-CM

## 2013-11-05 DIAGNOSIS — M1990 Unspecified osteoarthritis, unspecified site: Secondary | ICD-10-CM

## 2013-11-05 DIAGNOSIS — R5381 Other malaise: Secondary | ICD-10-CM

## 2013-11-05 DIAGNOSIS — E860 Dehydration: Secondary | ICD-10-CM

## 2013-11-05 DIAGNOSIS — R32 Unspecified urinary incontinence: Secondary | ICD-10-CM

## 2013-11-05 DIAGNOSIS — Z823 Family history of stroke: Secondary | ICD-10-CM

## 2013-11-05 DIAGNOSIS — N3000 Acute cystitis without hematuria: Secondary | ICD-10-CM

## 2013-11-05 DIAGNOSIS — G9340 Encephalopathy, unspecified: Secondary | ICD-10-CM

## 2013-11-05 DIAGNOSIS — R339 Retention of urine, unspecified: Secondary | ICD-10-CM | POA: Diagnosis present

## 2013-11-05 DIAGNOSIS — Z96652 Presence of left artificial knee joint: Secondary | ICD-10-CM

## 2013-11-05 LAB — I-STAT CG4 LACTIC ACID, ED: Lactic Acid, Venous: 2.38 mmol/L — ABNORMAL HIGH (ref 0.5–2.2)

## 2013-11-05 LAB — CBC WITH DIFFERENTIAL/PLATELET
BASOS ABS: 0 10*3/uL (ref 0.0–0.1)
Basophils Relative: 0 % (ref 0–1)
EOS PCT: 0 % (ref 0–5)
Eosinophils Absolute: 0 10*3/uL (ref 0.0–0.7)
HEMATOCRIT: 31.2 % — AB (ref 36.0–46.0)
Hemoglobin: 10.2 g/dL — ABNORMAL LOW (ref 12.0–15.0)
LYMPHS PCT: 8 % — AB (ref 12–46)
Lymphs Abs: 1.1 10*3/uL (ref 0.7–4.0)
MCH: 28 pg (ref 26.0–34.0)
MCHC: 32.7 g/dL (ref 30.0–36.0)
MCV: 85.7 fL (ref 78.0–100.0)
Monocytes Absolute: 0.3 10*3/uL (ref 0.1–1.0)
Monocytes Relative: 3 % (ref 3–12)
NEUTROS ABS: 11.6 10*3/uL — AB (ref 1.7–7.7)
Neutrophils Relative %: 89 % — ABNORMAL HIGH (ref 43–77)
Platelets: 209 10*3/uL (ref 150–400)
RBC: 3.64 MIL/uL — ABNORMAL LOW (ref 3.87–5.11)
RDW: 14.1 % (ref 11.5–15.5)
WBC: 13 10*3/uL — AB (ref 4.0–10.5)

## 2013-11-05 LAB — LACTIC ACID, PLASMA: Lactic Acid, Venous: 2.2 mmol/L (ref 0.5–2.2)

## 2013-11-05 LAB — COMPREHENSIVE METABOLIC PANEL
ALK PHOS: 103 U/L (ref 39–117)
ALT: 12 U/L (ref 0–35)
AST: 21 U/L (ref 0–37)
Albumin: 3.2 g/dL — ABNORMAL LOW (ref 3.5–5.2)
Anion gap: 19 — ABNORMAL HIGH (ref 5–15)
BUN: 125 mg/dL — ABNORMAL HIGH (ref 6–23)
CO2: 22 meq/L (ref 19–32)
Calcium: 9.4 mg/dL (ref 8.4–10.5)
Chloride: 88 mEq/L — ABNORMAL LOW (ref 96–112)
Creatinine, Ser: 6.62 mg/dL — ABNORMAL HIGH (ref 0.50–1.10)
GFR calc Af Amer: 6 mL/min — ABNORMAL LOW (ref >90)
GFR, EST NON AFRICAN AMERICAN: 5 mL/min — AB (ref >90)
GLUCOSE: 135 mg/dL — AB (ref 70–99)
Potassium: 6.6 mEq/L (ref 3.7–5.3)
SODIUM: 129 meq/L — AB (ref 137–147)
Total Bilirubin: 1.1 mg/dL (ref 0.3–1.2)
Total Protein: 7 g/dL (ref 6.0–8.3)

## 2013-11-05 LAB — I-STAT CHEM 8, ED
BUN: 129 mg/dL — AB (ref 6–23)
CALCIUM ION: 1.01 mmol/L — AB (ref 1.13–1.30)
CHLORIDE: 95 meq/L — AB (ref 96–112)
Creatinine, Ser: 7.2 mg/dL — ABNORMAL HIGH (ref 0.50–1.10)
Glucose, Bld: 131 mg/dL — ABNORMAL HIGH (ref 70–99)
HCT: 35 % — ABNORMAL LOW (ref 36.0–46.0)
Hemoglobin: 11.9 g/dL — ABNORMAL LOW (ref 12.0–15.0)
POTASSIUM: 6.4 meq/L — AB (ref 3.7–5.3)
Sodium: 126 mEq/L — ABNORMAL LOW (ref 137–147)
TCO2: 22 mmol/L (ref 0–100)

## 2013-11-05 LAB — I-STAT TROPONIN, ED: Troponin i, poc: 0.15 ng/mL (ref 0.00–0.08)

## 2013-11-05 LAB — URINALYSIS, ROUTINE W REFLEX MICROSCOPIC
Bilirubin Urine: NEGATIVE
GLUCOSE, UA: NEGATIVE mg/dL
Ketones, ur: NEGATIVE mg/dL
Nitrite: NEGATIVE
PH: 6 (ref 5.0–8.0)
Protein, ur: 100 mg/dL — AB
Specific Gravity, Urine: 1.013 (ref 1.005–1.030)
Urobilinogen, UA: 0.2 mg/dL (ref 0.0–1.0)

## 2013-11-05 LAB — APTT: aPTT: 32 seconds (ref 24–37)

## 2013-11-05 LAB — URINE MICROSCOPIC-ADD ON

## 2013-11-05 LAB — PROTIME-INR
INR: 1.31 (ref 0.00–1.49)
Prothrombin Time: 16.3 seconds — ABNORMAL HIGH (ref 11.6–15.2)

## 2013-11-05 LAB — TROPONIN I: Troponin I: <0.3 ng/mL (ref <0.30)

## 2013-11-05 LAB — MRSA PCR SCREENING: MRSA BY PCR: NEGATIVE

## 2013-11-05 LAB — TSH: TSH: 0.405 u[IU]/mL (ref 0.350–4.500)

## 2013-11-05 MED ORDER — DEXTROSE 5 % IV SOLN: 1.0000 g | INTRAVENOUS | Status: DC

## 2013-11-05 MED ORDER — INSULIN ASPART 100 UNIT/ML ~~LOC~~ SOLN
10.0000 [IU] | Freq: Once | SUBCUTANEOUS | Status: AC
  Administered 2013-11-05: 10 [IU] via SUBCUTANEOUS

## 2013-11-05 MED ORDER — SODIUM CHLORIDE 0.9 % IV SOLN
Freq: Once | INTRAVENOUS | Status: AC
  Administered 2013-11-05: 16:00:00 via INTRAVENOUS

## 2013-11-05 MED ORDER — PANTOPRAZOLE SODIUM 40 MG PO TBEC
40.0000 mg | DELAYED_RELEASE_TABLET | Freq: Two times a day (BID) | ORAL | Status: DC
  Administered 2013-11-05 – 2013-11-08 (×6): 40 mg via ORAL
  Filled 2013-11-05 (×8): qty 1

## 2013-11-05 MED ORDER — MORPHINE SULFATE 2 MG/ML IJ SOLN: 1.0000 mg | INTRAMUSCULAR | Status: DC | PRN

## 2013-11-05 MED ORDER — ANASTROZOLE 1 MG PO TABS
1.0000 mg | ORAL_TABLET | Freq: Every day | ORAL | Status: DC
  Administered 2013-11-06 – 2013-11-08 (×3): 1 mg via ORAL
  Filled 2013-11-05 (×4): qty 1

## 2013-11-05 MED ORDER — DEXTROSE 50 % IV SOLN
1.0000 | Freq: Once | INTRAVENOUS | Status: AC
  Administered 2013-11-05: 50 mL via INTRAVENOUS
  Filled 2013-11-05: qty 50

## 2013-11-05 MED ORDER — ACETAMINOPHEN 325 MG PO TABS
650.0000 mg | ORAL_TABLET | Freq: Four times a day (QID) | ORAL | Status: DC | PRN
  Administered 2013-11-06 – 2013-11-08 (×4): 650 mg via ORAL
  Filled 2013-11-05 (×4): qty 2

## 2013-11-05 MED ORDER — CEFTRIAXONE SODIUM 1 G IJ SOLR
1.0000 g | Freq: Once | INTRAMUSCULAR | Status: DC
  Administered 2013-11-05: 1 g via INTRAMUSCULAR
  Filled 2013-11-05: qty 10

## 2013-11-05 MED ORDER — ONDANSETRON HCL 4 MG PO TABS: 4.0000 mg | ORAL_TABLET | Freq: Four times a day (QID) | ORAL | Status: DC | PRN

## 2013-11-05 MED ORDER — LIDOCAINE HCL 1 % IJ SOLN
INTRAMUSCULAR | Status: AC
  Filled 2013-11-05: qty 20

## 2013-11-05 MED ORDER — CEFTRIAXONE SODIUM 1 G IJ SOLR: 2.0000 g | Freq: Once | INTRAMUSCULAR | Status: DC

## 2013-11-05 MED ORDER — ACETAMINOPHEN 650 MG RE SUPP: 650.0000 mg | Freq: Four times a day (QID) | RECTAL | Status: DC | PRN

## 2013-11-05 MED ORDER — SODIUM CHLORIDE 0.9 % IV SOLN: INTRAVENOUS | Status: DC

## 2013-11-05 MED ORDER — PREDNISONE 5 MG PO TABS
5.0000 mg | ORAL_TABLET | Freq: Every day | ORAL | Status: DC
  Administered 2013-11-06 – 2013-11-08 (×3): 5 mg via ORAL
  Filled 2013-11-05 (×4): qty 1

## 2013-11-05 MED ORDER — LATANOPROST 0.005 % OP SOLN
1.0000 [drp] | Freq: Every day | OPHTHALMIC | Status: DC
  Administered 2013-11-05 – 2013-11-07 (×3): 1 [drp] via OPHTHALMIC
  Filled 2013-11-05 (×2): qty 2.5

## 2013-11-05 MED ORDER — SODIUM POLYSTYRENE SULFONATE 15 GM/60ML PO SUSP
30.0000 g | Freq: Once | ORAL | Status: AC
  Administered 2013-11-05: 30 g via ORAL
  Filled 2013-11-05: qty 120

## 2013-11-05 MED ORDER — ONDANSETRON HCL 4 MG/2ML IJ SOLN: 4.0000 mg | Freq: Four times a day (QID) | INTRAMUSCULAR | Status: DC | PRN

## 2013-11-05 MED ORDER — SODIUM BICARBONATE 8.4 % IV SOLN
50.0000 meq | Freq: Once | INTRAVENOUS | Status: AC
  Administered 2013-11-05: 50 meq via INTRAVENOUS
  Filled 2013-11-05: qty 50

## 2013-11-05 MED ORDER — DEXTROSE 5 % IV SOLN
1.0000 g | INTRAVENOUS | Status: DC
  Administered 2013-11-06 – 2013-11-08 (×3): 1 g via INTRAVENOUS
  Filled 2013-11-05 (×3): qty 10

## 2013-11-05 MED ORDER — SODIUM CHLORIDE 0.9 % IV SOLN
1.0000 g | Freq: Once | INTRAVENOUS | Status: AC
  Administered 2013-11-05: 1 g via INTRAVENOUS
  Filled 2013-11-05: qty 10

## 2013-11-05 NOTE — Progress Notes (Signed)
UR completed 

## 2013-11-05 NOTE — ED Provider Notes (Signed)
CSN: [id number]     Arrival date & time 11/05/13  1437 History   First MD Initiated Contact with Patient 11/05/13 1452     Chief Complaint  Patient presents with  . Altered Mental Status  . Dizziness     (Consider location/radiation/quality/duration/timing/severity/associated sxs/prior Treatment) HPI Comments: The pt is an 80 y/o female with hx of Htn, GERD and BRCA.  She has hx of bilateral total knee arthroplasty, the left knee was replaced in the last month and the pt was treated with xarelto post op - she has also been prescribed flexeril.  Per EMS< the pt has been having some generalized weakness and feeling very very weak and dizzy.  She does not describe this but denies vertigo - states she is unable to walk b/c of weakness and has had some vomiting today which appeared to be coffee ground per EMS.  The sx are severe, persistent, nothing makes better or worse.  Of note the pt was recently prescribed opiates and flexeril to help with ongoing post op pain.  No fevers are reported.  Patient is a 80 y.o. female presenting with altered mental status and dizziness. The history is provided by the patient, the EMS personnel and medical records.  Altered Mental Status Dizziness   Past Medical History  Diagnosis Date  . Hypertension   . Asthma   . Glaucoma   . Incontinence   . Wears dentures   . Wears glasses   . Thyroid disease     "something years ago"  . GERD (gastroesophageal reflux disease)   . Cancer     rt breast  . Edema leg   . Fast breathing     "because of pill"  . Pneumonia     h/o younger  . Arthritis     "all over"  . S/P total knee arthroplasty   . Glaucoma   . Swelling     bilateral feet/ legs, more left.   Past Surgical History  Procedure Laterality Date  . Total hip arthroplasty Bilateral   . Shoulder surgery Left   . Appendectomy    . Carpal tunnel release Bilateral   . Tonsillectomy    . Colonoscopy    . Abdominal hysterectomy    . Eye surgery     both cataracts  . Breast biopsy  04/27/2011    Procedure: BREAST BIOPSY WITH NEEDLE LOCALIZATION;  Surgeon: Edward Jolly, MD;  Location: [facility];  Service: General;  Laterality: Right;  right needle localized breast lumpectomy   . Breast lumpectomy  04/27/11    right breast by [facility]  . Total knee arthroplasty Right 02/19/2013    Procedure: RIGHT TOTAL KNEE ARTHROPLASTY;  Surgeon: Gearlean Alf, MD;  Location: [facility];  Service: Orthopedics;  Laterality: Right;  . Total knee arthroplasty Left 10/15/2013    Procedure: LEFT TOTAL KNEE ARTHROPLASTY;  Surgeon: Gearlean Alf, MD;  Location: [facility];  Service: Orthopedics;  Laterality: Left;   Family History  Problem Relation Age of Onset  . Heart disease Mother   . Stroke Father    History  Substance Use Topics  . Smoking status: Former Smoker -- 0.50 packs/day for 30 years    Types: Cigarettes    Quit date: 04/21/1975  . Smokeless tobacco: Never Used  . Alcohol Use: No   OB History   Grav Para Term Preterm Abortions TAB SAB Ect Mult Living  Review of Systems  Neurological: Positive for dizziness.  All other systems reviewed and are negative.     Allergies  Milk-related compounds and Sulfa antibiotics  Home Medications   Prior to Admission medications   Medication Sig Start Date End Date Taking? Authorizing Provider  anastrozole (ARIMIDEX) 1 MG tablet Take 1 tablet (1 mg total) by mouth daily. 10/08/13  Yes Adrena E Johnson, PA-C  latanoprost (XALATAN) 0.005 % ophthalmic solution Place 1 drop into both eyes at bedtime.   Yes Historical Provider, MD  lisinopril (PRINIVIL,ZESTRIL) 20 MG tablet Take 20 mg by mouth every morning.   Yes Historical Provider, MD  oxyCODONE (OXY IR/ROXICODONE) 5 MG immediate release tablet Take 1-2 tablets by mouth every 4 (four) hours as needed for severe pain.  11/02/13  Yes Historical Provider, MD  predniSONE (DELTASONE) 5 MG tablet Take 5 mg by mouth daily  with breakfast.   Yes Historical Provider, MD  traMADol (ULTRAM) 50 MG tablet Take 1-2 tablets (50-100 mg total) by mouth every 6 (six) hours as needed (mild pain). 10/18/13  Yes Arlee Muslim, PA-C  nitrofurantoin, macrocrystal-monohydrate, (MACROBID) 100 MG capsule Take 1 capsule by mouth 2 (two) times daily. 11/02/13   Historical Provider, MD   BP 113/49  Pulse 99  Temp(Src) 97.9 F (36.6 C) (Oral)  Resp 20  SpO2 97% Physical Exam  Nursing note and vitals reviewed. Constitutional: She appears well-developed and well-nourished. She appears distressed.  HENT:  Head: Normocephalic and atraumatic.  Mouth/Throat: No oropharyngeal exudate.  Coffee ground emesis in the mouth and on the chest   Eyes: Conjunctivae and EOM are normal. Pupils are equal, round, and reactive to light. Right eye exhibits no discharge. Left eye exhibits no discharge. No scleral icterus.  Neck: Normal range of motion. Neck supple. No JVD present. No thyromegaly present.  Cardiovascular: Regular rhythm, normal heart sounds and intact distal pulses.  Exam reveals no gallop and no friction rub.   No murmur heard. Tachycardic, good pulses at radial arteries  Pulmonary/Chest: Effort normal and breath sounds normal. No respiratory distress. She has no wheezes. She has no rales.  silght increased work of breathing but normal breath sounds.  Abdominal: Soft. Bowel sounds are normal. She exhibits distension. She exhibits no mass. There is tenderness.  Full mass which is dull to percussion in the lower abdomen, otherwise abd is soft and non tender.  Musculoskeletal: Normal range of motion. She exhibits no edema.  LLE with knee swelling / and mild warmth but soft with moderate ROM.  Lymphadenopathy:    She has no cervical adenopathy.  Neurological: She is alert. Coordination normal.  Appears to have generalized diffuse weakness, no focal weakness.  Skin: Skin is warm and dry. No rash noted. No erythema.  No surrounding redness  to the L TKA wound, no drainage, steri strip still in place.  Psychiatric: She has a normal mood and affect. Her behavior is normal.    ED Course  Procedures (including critical care time) Labs Review Labs Reviewed  CBC WITH DIFFERENTIAL - Abnormal; Notable for the following:    WBC 13.0 (*)    RBC 3.64 (*)    Hemoglobin 10.2 (*)    HCT 31.2 (*)    Neutrophils Relative % 89 (*)    Neutro Abs 11.6 (*)    Lymphocytes Relative 8 (*)    All other components within normal limits  PROTIME-INR - Abnormal; Notable for the following:    Prothrombin Time 16.3 (*)  All other components within normal limits  COMPREHENSIVE METABOLIC PANEL - Abnormal; Notable for the following:    Sodium 129 (*)    Potassium 6.6 (*)    Chloride 88 (*)    Glucose, Bld 135 (*)    BUN 125 (*)    Creatinine, Ser 6.62 (*)    Albumin 3.2 (*)    GFR calc non Af Amer 5 (*)    GFR calc Af Amer 6 (*)    Anion gap 19 (*)    All other components within normal limits  URINALYSIS, ROUTINE W REFLEX MICROSCOPIC - Abnormal; Notable for the following:    Color, Urine RED (*)    APPearance TURBID (*)    Hgb urine dipstick LARGE (*)    Protein, ur 100 (*)    Leukocytes, UA LARGE (*)    All other components within normal limits  URINE MICROSCOPIC-ADD ON - Abnormal; Notable for the following:    Bacteria, UA MANY (*)    All other components within normal limits  I-STAT TROPOININ, ED - Abnormal; Notable for the following:    Troponin i, poc 0.15 (*)    All other components within normal limits  I-STAT CHEM 8, ED - Abnormal; Notable for the following:    Sodium 126 (*)    Potassium 6.4 (*)    Chloride 95 (*)    BUN 129 (*)    Creatinine, Ser 7.20 (*)    Glucose, Bld 131 (*)    Calcium, Ion 1.01 (*)    Hemoglobin 11.9 (*)    HCT 35.0 (*)    All other components within normal limits  I-STAT CG4 LACTIC ACID, ED - Abnormal; Notable for the following:    Lactic Acid, Venous 2.38 (*)    All other components within  normal limits  URINE CULTURE  CULTURE, BLOOD (ROUTINE X 2)  CULTURE, BLOOD (ROUTINE X 2)  APTT  TROPONIN I    Imaging Review Dg Chest Port 1 View  11/05/2013   CLINICAL DATA:  Asthma and chest pain ; breast carcinoma  EXAM: PORTABLE CHEST - 1 VIEW  COMPARISON:  February 15, 2013  FINDINGS: There is no edema or consolidation. Patchy interstitial fibrosis remains without change. Heart is upper normal in size with pulmonary vascularity within normal limits. No adenopathy. There is atherosclerotic change in aorta. Bones are somewhat osteoporotic. There is evidence of prior trauma in the lateral right clavicle with widening of the acromioclavicular joint on the right.  IMPRESSION: Underlying patchy interstitial fibrosis. No edema or consolidation. Widening of the acromioclavicular joint on the right, more apparent than on prior study.   Electronically Signed   By: Lowella Grip M.D.   On: 11/05/2013 15:24     EKG Interpretation   Date/Time:  Monday November 05 2013 14:49:10 EDT Ventricular Rate:  100 PR Interval:  168 QRS Duration: 92 QT Interval:  346 QTC Calculation: 446 R Axis:   -11 Text Interpretation:  Normal sinus rhythm TWI in lateral leads ST  depression in inferolateral leads Prominent T wave morphology in V1, V2  Changed from prior Confirmed by DOCHERTY  MD, MEGAN ([id number]) on 11/05/2013  2:55:27 PM      MDM   Final diagnoses:  Acute renal failure, unspecified acute renal failure type  UTI (lower urinary tract infection)  Hyperkalemia  Hyponatremia  Uremia    The pt is ill appearing - she is gernally weak but non focal.  She has new changes on her ECG which  are concerning and not seen on prior ECG. She has urinary retention based on exam and confirmed with bedside bladder scan - likely flexeril, r/o infection, suspect UGI bleed (on xarelto).  Will need admission.  5 PM - as suspected the pt has ARF with Uremia and electrolyte abnormlaities which are acute, severe and life  threatening - she has had foley catheter placed to relive the pos obstructive uropathy. She does have urinary tract infection as well, antibiotics have been ordered to cover. Blood cultures ordered as well.  Hyperkalemia treated with sodium bicarbonate and Kayexalate, discussed with hospitalist to admit to a monitored step down unit as the patient is critically ill.  CRITICAL CARE Performed by: Johnna Acosta Total critical care time: 35 Critical care time was exclusive of separately billable procedures and treating other patients. Critical care was necessary to treat or prevent imminent or life-threatening deterioration. Critical care was time spent personally by me on the following activities: development of treatment plan with patient and/or surrogate as well as nursing, discussions with consultants, evaluation of patient's response to treatment, examination of patient, obtaining history from patient or surrogate, ordering and performing treatments and interventions, ordering and review of laboratory studies, ordering and review of radiographic studies, pulse oximetry and re-evaluation of patient's condition.   Johnna Acosta, MD 11/05/13 [phone]

## 2013-11-05 NOTE — ED Notes (Signed)
Bed: ZV47 Expected date: 11/05/13 Expected time: 2:30 PM Amor of arrival: Ambulance Comments: Lethargic post op

## 2013-11-05 NOTE — ED Notes (Signed)
MD at bedside. Hospitalist at bedside. 

## 2013-11-05 NOTE — ED Notes (Signed)
Attempted to call report to ICU. RN unable to take report at this time. Will call back.

## 2013-11-05 NOTE — ED Notes (Signed)
First set of Blood cultures obtained.

## 2013-11-05 NOTE — H&P (Signed)
[facility] History and Physical  [name] WIO:[id number] DOB: 1925-04-19 DOA: 11/05/2013  Referring physician: Dr. Sabra Heck PCP: Myriam Jacobson, MD   Chief Complaint: Anorexia, nausea/vomiting, suprapubic abdominal pain; confusion  HPI: Natasha Fletcher is a 80 y.o. female with a past medical history significant for hypertension, asthma, glaucoma, urine incontinence, thyroid disease, RA, gastroesophageal reflux disease and recent left knee total arthroplasty; who came from home secondary to changes in mental status, abdominal pain, nausea/vomiting decrease appetite. Patient lives at home with her sister who has mention patient has been acting slightly confused and with increased somnolence; patient self reports decreased appetite and no good eating/drinking for the last week or so. She was recently seen by orthopedic service who had increase her pain medications and started her on Flexeril. She reports severe nausea vomiting over the last 48 hours with the last episode of vomiting to be coffee-ground emesis in appearance. In the ED patient was found with globus vesicalis, acute renal failure (creatinine of 7.2), hyperkalemia with a potassium of 6.4 and with findings suggesting UTI. Patient was also severely dehydrated and with elevated lactic acid. Troponins were mildly elevated. Of note patient denies chest pain, palpitations, shortness of breath, fevers, headaches, blurred vision, melena, hematochezia or any other acute complaints. Patient immediately received initial fluid resuscitation, calcium gluconate, Kayexalate, insulin and sodium bicarbonate as part of treatment for her dehydration/hyperkalemia. Cultures were taken and patient was started on Rocephin empirically. [facility] hospital[facility] has been called to admit the patient for further evaluation and treatment.    Review of Systems:  Negative except as otherwise mentioned on history of present illness  Past Medical History    Diagnosis Date  . Hypertension   . Asthma   . Glaucoma   . Incontinence   . Wears dentures   . Wears glasses   . Thyroid disease     "something years ago"  . GERD (gastroesophageal reflux disease)   . Cancer     rt breast  . Edema leg   . Fast breathing     "because of pill"  . Pneumonia     h/o younger  . Arthritis     "all over"  . S/P total knee arthroplasty   . Glaucoma   . Swelling     bilateral feet/ legs, more left.   Past Surgical History  Procedure Laterality Date  . Total hip arthroplasty Bilateral   . Shoulder surgery Left   . Appendectomy    . Carpal tunnel release Bilateral   . Tonsillectomy    . Colonoscopy    . Abdominal hysterectomy    . Eye surgery      both cataracts  . Breast biopsy  04/27/2011    Procedure: BREAST BIOPSY WITH NEEDLE LOCALIZATION;  Surgeon: Edward Jolly, MD;  Location: [facility];  Service: General;  Laterality: Right;  right needle localized breast lumpectomy   . Breast lumpectomy  04/27/11    right breast by [facility]  . Total knee arthroplasty Right 02/19/2013    Procedure: RIGHT TOTAL KNEE ARTHROPLASTY;  Surgeon: Gearlean Alf, MD;  Location: [facility];  Service: Orthopedics;  Laterality: Right;  . Total knee arthroplasty Left 10/15/2013    Procedure: LEFT TOTAL KNEE ARTHROPLASTY;  Surgeon: Gearlean Alf, MD;  Location: [facility];  Service: Orthopedics;  Laterality: Left;   Social History:  reports that she quit smoking about 38 years ago. Her smoking use included Cigarettes. She has a 15 pack-year smoking  history. She has never used smokeless tobacco. She reports that she does not drink alcohol or use illicit drugs.  Allergies  Allergen Reactions  . Milk-Related Compounds Other (See Comments)    Upset stomach   . Sulfa Antibiotics Other (See Comments)    Reaction many years ago-unknown    Family History  Problem Relation Age of Onset  . Heart disease Mother   . Stroke Father      Prior to Admission  medications   Medication Sig Start Date End Date Taking? Authorizing Provider  anastrozole (ARIMIDEX) 1 MG tablet Take 1 tablet (1 mg total) by mouth daily. 10/08/13  Yes Adrena E Johnson, PA-C  latanoprost (XALATAN) 0.005 % ophthalmic solution Place 1 drop into both eyes at bedtime.   Yes Historical Provider, MD  lisinopril (PRINIVIL,ZESTRIL) 20 MG tablet Take 20 mg by mouth every morning.   Yes Historical Provider, MD  oxyCODONE (OXY IR/ROXICODONE) 5 MG immediate release tablet Take 1-2 tablets by mouth every 4 (four) hours as needed for severe pain.  11/02/13  Yes Historical Provider, MD  predniSONE (DELTASONE) 5 MG tablet Take 5 mg by mouth daily with breakfast.   Yes Historical Provider, MD  traMADol (ULTRAM) 50 MG tablet Take 1-2 tablets (50-100 mg total) by mouth every 6 (six) hours as needed (mild pain). 10/18/13  Yes Arlee Muslim, PA-C  nitrofurantoin, macrocrystal-monohydrate, (MACROBID) 100 MG capsule Take 1 capsule by mouth 2 (two) times daily. 11/02/13   Historical Provider, MD   Physical Exam: Filed Vitals:   11/05/13 1441 11/05/13 1616  BP: 119/61 113/49  Pulse: 99 99  Temp: 98.2 F (36.8 C) 97.9 F (36.6 C)  TempSrc: Oral Oral  Resp: 20 20  SpO2:  97%    Wt Readings from Last 3 Encounters:  11/01/13 65.454 kg (144 lb 4.8 oz)  10/29/13 66.225 kg (146 lb)  10/15/13 68.947 kg (152 lb)    General:  Alert, awake and oriented x2; elderly and frail; in mild distress secondary to nausea; able to properly follow commands Eyes: PERRL, normal lids, irises & conjunctiva, no icterus or nystagmus ENT: grossly normal hearing, dry mucous membranes; no erythema or exudates inside her mouth; no discoloration from previous coffee-ground emesis episode appreciated on exam. No drainage out of her ears or nostrils Neck: no LAD, masses or thyromegaly; no JVD Cardiovascular: Tachycardic, no rubs, no gallops Respiratory: CTA bilaterally, no w/r/r. Normal respiratory effort. Abdomen: soft, nt; at  the moment of admission patient with abdominal distention and complaining of pain in suprapubic area , the symptoms improved after Foley was placed in the emergency department. Positive bowel sounds Skin: no rash or induration seen on exam; left knee with clean incision, dry and without any erythema. Musculoskeletal: Decreased movement and mild swelling of her left knee after recent left knee total arthroplasty Psychiatric: grossly normal mood and affect, speech fluent and appropriate Neurologic: grossly non-focal.          Labs on Admission:  Basic Metabolic Panel:  Recent Labs Lab 11/05/13 1535 11/05/13 1559  NA 129* 126*  K 6.6* 6.4*  CL 88* 95*  CO2 22  --   GLUCOSE 135* 131*  BUN 125* 129*  CREATININE 6.62* 7.20*  CALCIUM 9.4  --    Liver Function Tests:  Recent Labs Lab 11/05/13 1535  AST 21  ALT 12  ALKPHOS 103  BILITOT 1.1  PROT 7.0  ALBUMIN 3.2*   CBC:  Recent Labs Lab 11/05/13 1535 11/05/13 1559  WBC 13.0*  --  NEUTROABS 11.6*  --   HGB 10.2* 11.9*  HCT 31.2* 35.0*  MCV 85.7  --   PLT 209  --    Cardiac Enzymes:  Recent Labs Lab 11/05/13 1535  TROPONINI <0.30    Radiological Exams on Admission: Dg Chest Port 1 View  11/05/2013   CLINICAL DATA:  Asthma and chest pain ; breast carcinoma  EXAM: PORTABLE CHEST - 1 VIEW  COMPARISON:  February 15, 2013  FINDINGS: There is no edema or consolidation. Patchy interstitial fibrosis remains without change. Heart is upper normal in size with pulmonary vascularity within normal limits. No adenopathy. There is atherosclerotic change in aorta. Bones are somewhat osteoporotic. There is evidence of prior trauma in the lateral right clavicle with widening of the acromioclavicular joint on the right.  IMPRESSION: Underlying patchy interstitial fibrosis. No edema or consolidation. Widening of the acromioclavicular joint on the right, more apparent than on prior study.   Electronically Signed   By: Lowella Grip  M.D.   On: 11/05/2013 15:24    EKG:  Mild tachycardia, sinus rhythm, mild T-wave inversion in lateral leads and ST depression in inferolateral leads; this also prominent T waves in anterolateral leads.  Assessment/Plan 1-acute renal failure: Appears to multifactorial, secondary to prerenal azotemia with severe dehydration, with associated continue use of nephrotoxic agents (lisinopril, Lasix); there is also a component of UTI and urinary retention. -Patient will be admitted to step down (due to SIRS) -Will provide fluid resuscitation -Follow lactic acid and renal function -Patient will be started on Rocephin and urine cultures/blood cultures has been taken -Will discontinue/call nephrotoxic agents -Will check a renal ultrasound and TSH -Based on clinical response patient might require renal service consultation  2-Cancer of central portion of female breast: continue arimidex  3-Thyroid disease: Will check TSH and base on results will start supplementation if needed. -Patient was not receiving any Synthroid at home  4-UTI (urinary tract infection): As mentioned above will empirically start the patient on Rocephin; follow urine cultures.  5-history of GERD (gastroesophageal reflux disease), with nausea/vomiting and most likely mallory weiss: Patient reports experiencing nausea vomiting and multiple occasions (past 48 hours) with last episode of vomiting been coffee-ground emesis. -Patient will be placed on full liquids as part of bowel rest approach -will follow Hgb -start PPI BID  6-Urinary retention: Will stop the use of the troponin, Flexeril and minimize the use of narcotics. -Foley catheter has been placed and patient start voiding with decompression of the bladder by physical exam  -will follow renal ultrasound.  7-Dehydration and hyponatremia: Patient reports approximately a week or so, history of no eating or drinking properly secondary to anorexia. -Will provide fluid  resuscitation -Coronary given episode of coffee-ground emesis will use just full liquids -Plan is to slowly advance diet as tolerated and add feeding supplements. -Follow electrolytes trend  8-Encephalopathy acute: Mild most likely secondary to anemia. -By the moment the patient was exam by myself she was alert, oriented x2 unable to follow commands. -Might in that UTI is also contributing to her encephalopathy. -Follow clinical response after improving her renal function and treating infection  9-Hyperkalemia: 2/2 acute renal failure. -Patient received calcium gluconate, Kayexalate, insulin and sodium bicarbonate while in the emergency department -Will follow potassium and provide further treatments to overcome and exercise and Lantus as needed -Lisinopril has been discontinue  10-Coffee ground emesis: As mentioned above most likely secondary to Mallory-Weiss tear with ongoing repetitive  episode of vomiting. -Started on PPI twice a day -  PRN antiemetics  11-elevated troponin: Patient without chest pain or shortness of breath. EKG with mild ST depression changes that Could be secondary to electrolytes abnormalities. -Will cycle cardiac enzymes and EKG -If needed will contact cardiologist  12-lactic acidosis: Secondary to problem #1 dehydration -Will provide fluid resuscitation and track lactic acid trend  13-SIRS: most likely due to UTI -empirically started on rocephin -follow cx's -continue fluid resuscitation -follow clinical response -admitted to stepdown  14-RA: continue chronic low dose predisone  15-hypertension: stable. Will hold antihypertensive regimen at this moment and provide fluid resuscitation. -Will follow vital signs And resume medications as needed/indicated   Code Status: DNR DVT Prophylaxis:SCD's Family Communication: no family at bedside; but sister contacted by phone Disposition Plan: Inpatient, length of stay more than 2 midnights; step down  Time  spent: 55 minutes  Barton Dubois [facility] Pager [phone]  **Disclaimer: This note may have been dictated with voice recognition software. Similar sounding words can inadvertently be transcribed and this note may contain transcription errors which may not have been corrected upon publication of note.**

## 2013-11-05 NOTE — ED Notes (Addendum)
Per EMS pt had total knee replacement mid July, since Saturday patient has been increasingly lethargic, EMS states that patient's family is poor historian and that medications patient has been taking do not match medications prescribed. Per EMS patient's family did not understand MD's instructions for medication. Per EMS patient did vomit this morning, based on description by physical therapist emesis may have been coffee-ground emesis.

## 2013-11-05 NOTE — ED Notes (Signed)
Sabra Heck, EDP shown CG4 Lactic and I-Stat troponin results.

## 2013-11-06 DIAGNOSIS — E43 Unspecified severe protein-calorie malnutrition: Secondary | ICD-10-CM

## 2013-11-06 DIAGNOSIS — R5381 Other malaise: Secondary | ICD-10-CM

## 2013-11-06 DIAGNOSIS — I369 Nonrheumatic tricuspid valve disorder, unspecified: Secondary | ICD-10-CM

## 2013-11-06 LAB — LACTIC ACID, PLASMA: LACTIC ACID, VENOUS: 1.9 mmol/L (ref 0.5–2.2)

## 2013-11-06 LAB — RENAL FUNCTION PANEL
Albumin: 3 g/dL — ABNORMAL LOW (ref 3.5–5.2)
Anion gap: 14 (ref 5–15)
BUN: 95 mg/dL — ABNORMAL HIGH (ref 6–23)
CALCIUM: 9.1 mg/dL (ref 8.4–10.5)
CO2: 25 meq/L (ref 19–32)
Chloride: 99 mEq/L (ref 96–112)
Creatinine, Ser: 2.42 mg/dL — ABNORMAL HIGH (ref 0.50–1.10)
GFR calc Af Amer: 20 mL/min — ABNORMAL LOW (ref >90)
GFR calc non Af Amer: 17 mL/min — ABNORMAL LOW (ref >90)
Glucose, Bld: 113 mg/dL — ABNORMAL HIGH (ref 70–99)
Phosphorus: 4 mg/dL (ref 2.3–4.6)
Potassium: 4.4 mEq/L (ref 3.7–5.3)
SODIUM: 138 meq/L (ref 137–147)

## 2013-11-06 LAB — CBC
HCT: 28.2 % — ABNORMAL LOW (ref 36.0–46.0)
HEMOGLOBIN: 9.4 g/dL — AB (ref 12.0–15.0)
MCH: 28.1 pg (ref 26.0–34.0)
MCHC: 33.3 g/dL (ref 30.0–36.0)
MCV: 84.4 fL (ref 78.0–100.0)
Platelets: 213 10*3/uL (ref 150–400)
RBC: 3.34 MIL/uL — AB (ref 3.87–5.11)
RDW: 14 % (ref 11.5–15.5)
WBC: 13.2 10*3/uL — ABNORMAL HIGH (ref 4.0–10.5)

## 2013-11-06 LAB — TROPONIN I: Troponin I: <0.3 ng/mL (ref <0.30)

## 2013-11-06 MED ORDER — SODIUM CHLORIDE 0.9 % IV SOLN
INTRAVENOUS | Status: AC
  Administered 2013-11-07: 05:00:00 via INTRAVENOUS

## 2013-11-06 MED ORDER — ENSURE COMPLETE PO LIQD
237.0000 mL | Freq: Two times a day (BID) | ORAL | Status: DC
  Administered 2013-11-07 – 2013-11-08 (×3): 237 mL via ORAL

## 2013-11-06 MED ORDER — SODIUM CHLORIDE 0.9 % IV SOLN
INTRAVENOUS | Status: DC
  Administered 2013-11-07 – 2013-11-08 (×3): via INTRAVENOUS

## 2013-11-06 NOTE — Progress Notes (Signed)
Clinical Social Work Department BRIEF PSYCHOSOCIAL ASSESSMENT 11/06/2013  Patient:  Natasha Fletcher, Natasha Fletcher     Account Number:  [id number]     Admit date:  11/05/2013  Clinical Social Worker:  Ulyess Blossom  Date/Time:  11/06/2013 10:52 AM  Referred by:  Physician  Date Referred:  11/06/2013 Referred for  SNF Placement   Other Referral:   Interview type:  Patient Other interview type:   and patient brother and patient sister at bedside    PSYCHOSOCIAL DATA Living Status:  FAMILY Admitted from facility:   Level of care:   Primary support name:  [name] Id-Deem/brother/[phone] Primary support relationship to patient:  SIBLING Degree of support available:   strong    CURRENT CONCERNS Current Concerns  Post-Acute Placement   Other Concerns:    SOCIAL WORK ASSESSMENT / PLAN CSW received consult and phone call from RN stating that pt brother concerned about pt ability to care for herself at home.    CSW reviewed chart and noted that pt with recent left knee total arthroplasty and went to rehab following surgery. Pt discharged from rehab on 7/31 and returned to hospital 8/3 with Acute Renal Failure.    CSW met with pt, pt sister, and pt brother at bedside. CSW introduced self and explained role. Pt reports that she lives at home with her sister. CSW provided support as pt discussed that care at home has been difficult since being discharged from [facility]. Pt brother shared that pt was at [facility] for one week following surgery and then returned home. CSW discussed with pt and pt family that MD has ordered PT which will evaluate for disposition needs, but it is likely that recommendation with be for return to SNF for continued rehab. Pt expressed understanding and discussed that she agrees that she would benefit from continued rehab. Pt shared that she is interested in returning to [facility], but would like to explore all options before making a decision. CSW expressed  understanding and clarified pt questions and concerns.    CSW discussed that pt insurance, [employer] requires insurance authorization prior to pt transition to SNF. Pt expressed understanding.    CSW completed FL2 and initiated SNF search to [facility]. CSW to await PT evaluation in order to initiate insurance authorization process.    CSW to follow up with pt and pt family re: SNF bed offers.    CSW to continue to follow to assist with pt disposition needs.   Assessment/plan status:  Psychosocial Support/Ongoing Assessment of Needs Other assessment/ plan:   discharge planning   Information/referral to community resources:   [facility] list    PATIENT'S/FAMILY'S RESPONSE TO PLAN OF CARE: Pt alert and oriented x 4. Pt sister and pt brother supportive and actively involved in pt care. Pt recognizes that care was difficult at home and that she will benefit from short term rehab at [facility] before returning home. Pt and pt family appreciative of CSW visit and assistance.    Alison Murray, MSW, [place] [facility] [phone]

## 2013-11-06 NOTE — Evaluation (Signed)
Physical Therapy Evaluation Patient Details Name: Natasha Fletcher MRN: [id number] DOB: 05-Aug-1925 Today's Date: 11/06/2013   History of Present Illness  80 yo female admitted 11/05/13 with confusion, N/V , abdominal pain. Found to be, in acute renal failure, hyperkalemia,  uti. Pt s/p LTKA 10/15/13. recently DC'd from snf rehab  Clinical Impression  Pt very lethargic during evaluation. Did arouse to mobilize to the recliner.Pt will benefit from PT while in acute care to address problems listed in chair.    Follow Up Recommendations SNF    Equipment Recommendations  None recommended by PT    Recommendations for Other Services       Precautions / Restrictions Precautions Precautions: Fall;Knee Precaution Comments: monitor BP,HR      Mobility  Bed Mobility Overal bed mobility: Needs Assistance Bed Mobility: Supine to Sit     Supine to sit: Min assist     General bed mobility comments: extra time as pt is lethergic, frequent cues to keep pt on task  Transfers Overall transfer level: Needs assistance Equipment used: Rolling walker (2 wheeled) Transfers: Sit to/from [facility] Sit to Stand: Mod assist Stand pivot transfers: Mod assist       General transfer comment: Cues for safety, frequent cues to take steps back toward recliner, extra time due to lethargy.  Ambulation/Gait                Stairs            Wheelchair Mobility    Modified Rankin (Stroke Patients Only)       Balance                                             Pertinent Vitals/Pain Max HR 122, BP 134/42    Home Living Family/patient expects to be discharged to:: Skilled nursing facility                 Additional Comments: was DC'd from [facility] recently after tka     Prior Function Level of Independence: Needs assistance   Gait / Transfers Assistance Needed: sister and brother caregivers/           Hand Dominance         Extremity/Trunk Assessment   Upper Extremity Assessment: Overall WFL for tasks assessed           Lower Extremity Assessment: LLE deficits/detail   LLE Deficits / Details: Knee flexion to about 90*, able to perform SLR.     Communication      Cognition Arousal/Alertness: Lethargic Behavior During Therapy: WFL for tasks assessed/performed Overall Cognitive Status: Impaired/Different from baseline       Memory: Decreased short-term memory              General Comments      Exercises Total Joint Exercises Quad Sets: AROM;Left;5 reps Heel Slides: AAROM;Left;5 reps Straight Leg Raises: AAROM;Left;5 reps      Assessment/Plan    PT Assessment Patient needs continued PT services  PT Diagnosis Difficulty walking   PT Problem List Decreased strength;Decreased range of motion;Decreased activity tolerance;Decreased balance;Decreased mobility;Decreased knowledge of use of DME;Decreased knowledge of precautions  PT Treatment Interventions DME instruction;Gait training;Functional mobility training;Therapeutic exercise;Therapeutic activities;Patient/family education   PT Goals (Current goals can be found in the Care Plan section) Acute Rehab PT Goals Patient Stated Goal: agreed to get OOB PT  Goal Formulation: With patient Time For Goal Achievement: 11/20/13 Potential to Achieve Goals: Good    Frequency Min 3X/week   Barriers to discharge Decreased caregiver support      Co-evaluation               End of Session Equipment Utilized During Treatment: Gait belt Activity Tolerance: Patient limited by lethargy Patient left: in chair;with call bell/phone within reach;with chair alarm set;with nursing/sitter in room Nurse Communication: Mobility status         Time: 8657-8469 PT Time Calculation (min): 31 min   Charges:   PT Evaluation $Initial PT Evaluation Tier I: 1 Procedure PT Treatments $Therapeutic Activity: 23-37 mins   PT G Codes:           Natasha Fletcher 11/06/2013, 12:56 PM Natasha Fletcher PT [phone]

## 2013-11-06 NOTE — Progress Notes (Signed)
INITIAL NUTRITION ASSESSMENT  Pt meets criteria for severe MALNUTRITION in the context of chronic illness as evidenced by 6.5% weight loss with <75% estimated energy intake in the past month.  DOCUMENTATION CODES Per approved criteria  -Severe malnutrition in the context of chronic illness   INTERVENTION: - Diet advancement per MD - Ensure Complete BID - RD to continue to monitor   NUTRITION DIAGNOSIS: Unintended weight loss related to poor appetite, nausea/vomiting as evidenced by pt report.    Goal: 1. No further nausea/vomiting 2. Advance diet as tolerated to regular diet   Monitor:  Weights, labs, diet advancement, nausea/vomiting   Reason for Assessment: Malnutrition screening tool   80 y.o. female  Admitting Dx: Anorexia, nausea/vomiting, suprapubic abdominal pain; confusion   ASSESSMENT: Pt with past medical history significant for hypertension, asthma, glaucoma, urine incontinence, thyroid disease, RA, gastroesophageal reflux disease and recent left knee total arthroplasty; who came from home secondary to changes in mental status, abdominal pain, nausea/vomiting decrease appetite. Patient lives at home with her sister who has mention patient has been acting slightly confused and with increased somnolence; patient self reports decreased appetite and not good eating/drinking for the last week or so.   - Pt and family report pt has had nausea x 1 week with very minimal intake  - Was consuming mostly Ginger-ale and a few saltine crackers - Weighed 150 pounds before knee surgery last month, now weighs 142 pounds - Did well with broth today and denies any nausea/vomiting today - Said last emesis was yesterday and it was black/coffee-emesis - Agreeable to getting Ensure Complete - Pt with mild muscle wasting in clavicles and mild fat loss in upper arms - Unable to perform full nutrition focused physical exam as pt starting to fall asleep    Height: Ht Readings from Last  1 Encounters:  11/05/13 5\' 6"  (1.676 m)    Weight: Wt Readings from Last 1 Encounters:  11/06/13 142 lb 10.2 oz (64.7 kg)    Ideal Body Weight: 130 lbs   % Ideal Body Weight: 109%  Wt Readings from Last 10 Encounters:  11/06/13 142 lb 10.2 oz (64.7 kg)  11/01/13 144 lb 4.8 oz (65.454 kg)  10/29/13 146 lb (66.225 kg)  10/15/13 152 lb (68.947 kg)  10/15/13 152 lb (68.947 kg)  10/08/13 152 lb 14.4 oz (69.355 kg)  10/02/13 149 lb (67.586 kg)  03/22/13 147 lb (66.679 kg)  03/20/13 155 lb (70.308 kg)  02/19/13 161 lb (73.029 kg)    Usual Body Weight: 150 lbs per pt  % Usual Body Weight: 95%  BMI:  Body mass index is 23.03 kg/(m^2).  Estimated Nutritional Needs: Kcal: 2130-8657 Protein: 75-90g Fluid: per MD  Skin: +1 RLE edema, left knee incision   Diet Order: Full Liquid  EDUCATION NEEDS: -No education needs identified at this time   Intake/Output Summary (Last 24 hours) at 11/06/13 1051 Last data filed at 11/06/13 0900  Gross per 24 hour  Intake   1550 ml  Output   2950 ml  Net  -1400 ml    Last BM: PTA  Labs:   Recent Labs Lab 11/05/13 1535 11/05/13 1559 11/06/13 0821  NA 129* 126* 138  K 6.6* 6.4* 4.4  CL 88* 95* 99  CO2 22  --  25  BUN 125* 129* 95*  CREATININE 6.62* 7.20* 2.42*  CALCIUM 9.4  --  9.1  PHOS  --   --  4.0  GLUCOSE 135* 131* 113*  CBG (last 3)  No results found for this basename: GLUCAP,  in the last 72 hours  Scheduled Meds: . sodium chloride   Intravenous STAT  . anastrozole  1 mg Oral Daily  . cefTRIAXone (ROCEPHIN)  IV  1 g Intravenous Q24H  . latanoprost  1 drop Both Eyes QHS  . pantoprazole  40 mg Oral BID  . predniSONE  5 mg Oral Q breakfast    Continuous Infusions:   Past Medical History  Diagnosis Date  . Hypertension   . Asthma   . Glaucoma   . Incontinence   . Wears dentures   . Wears glasses   . Thyroid disease     "something years ago"  . GERD (gastroesophageal reflux disease)   . Cancer      rt breast  . Edema leg   . Fast breathing     "because of pill"  . Pneumonia     h/o younger  . Arthritis     "all over"  . S/P total knee arthroplasty   . Glaucoma   . Swelling     bilateral feet/ legs, more left.    Past Surgical History  Procedure Laterality Date  . Total hip arthroplasty Bilateral   . Shoulder surgery Left   . Appendectomy    . Carpal tunnel release Bilateral   . Tonsillectomy    . Colonoscopy    . Abdominal hysterectomy    . Eye surgery      both cataracts  . Breast biopsy  04/27/2011    Procedure: BREAST BIOPSY WITH NEEDLE LOCALIZATION;  Surgeon: Edward Jolly, MD;  Location: [place];  Service: General;  Laterality: Right;  right needle localized breast lumpectomy   . Breast lumpectomy  04/27/11    right breast by [place]  . Total knee arthroplasty Right 02/19/2013    Procedure: RIGHT TOTAL KNEE ARTHROPLASTY;  Surgeon: Gearlean Alf, MD;  Location: [facility];  Service: Orthopedics;  Laterality: Right;  . Total knee arthroplasty Left 10/15/2013    Procedure: LEFT TOTAL KNEE ARTHROPLASTY;  Surgeon: Gearlean Alf, MD;  Location: [facility];  Service: Orthopedics;  Laterality: Left;    Carlis Stable MS, [place], [place] Pager [phone] Weekend/After Hours Pager

## 2013-11-06 NOTE — Progress Notes (Addendum)
[facility] PROGRESS NOTE  Natasha Fletcher VHQ:[id number] DOB: 05/16/1925 DOA: 11/05/2013 PCP: Myriam Jacobson, MD  Assessment/Plan: 1-acute renal failure: Appears to multifactorial, secondary to prerenal azotemia with severe dehydration, with associated continue use of nephrotoxic agents (lisinopril, Lasix); there is also a component of UTI and urinary retention.  -Patient doing better and renal function responding appropriately to therapy -Cr down to 2.4  -Will continue fluid resuscitation  -Follow lactic acid and renal function  -continue rocephin for UTI therapy -Will continue holding nephrotoxic agents  -renal US w/o hydronephrosis or obstruction  2-Cancer of central portion of female breast: continue arimidex   3-Thyroid disease:  -TSH WNL -Patient was not receiving any Synthroid at home   4-E. Coli UTI (urinary tract infection): As mentioned above will continue empirically Rocephin; follow urine cultures sensitivity.   5-history of GERD (gastroesophageal reflux disease), with nausea/vomiting and most likely mallory weiss: Patient reports experiencing nausea vomiting and multiple occasions (int he past 48 hours prior to admission) with last episode of vomiting been coffee-ground emesis in appearance .  -continue full liquid diet -follow Hgb  -continue PPI BID   6-Urinary retention: Will stop the use of the oxybutinin, Flexeril and minimize the use of narcotics.  -Foley catheter has been placed and patient start voiding with decompression of the bladder by physical exam  -will follow renal negative for hydronephrosis or obstruction; bladder decompressed.   7-Dehydration and hyponatremia: Patient reports approximately a week or so, of no eating or drinking properly secondary to anorexia.  -advance diet slowly; currently on full liquids -follow electrolytes -continue IVF's   8-Encephalopathy acute: Mild most likely secondary to uremia and UTI -Follow clinical  response after improving her renal function and treating infection   9-Hyperkalemia: 2/2 acute renal failure.  -Patient received calcium gluconate, Kayexalate, insulin and sodium bicarbonate while in the emergency department  -potassium WNL now; will monitor -lisinopril remains on hold  10-Coffee ground emesis: As mentioned above most likely secondary to Mallory-Weiss tear with ongoing repetitive episode of vomiting prior to admission.  -continue PPI twice a day  -continue PRN antiemetics  -Hgb stable -no further hematemesis episodes  11-elevated troponin: Patient without chest pain or shortness of breath. EKG with mild ST depression changes on admission. -follow 2-d echo -repeated troponin X 3 neg -most likely secondary to renal failure and mild demand ischemia with SIRS/sepsis   12-lactic acidosis: Secondary to problem #1 and dehydration  -resolved with fluid resuscitation -lactic acid on 8/4 back to normal range   13-SIRS/early sepsis: most likely due to UTI  -empirically started on rocephin; which will continue  -urine cx demonstrating E. Coli; sensitivity pending -blood cx no growth to date -follow clinical repsonse  14-RA: continue chronic low dose predisone   15-hypertension: stable. Will continue holding antihypertensive agents for now -follow vital signs and continue fluid resuscitation   16-protein calorie malnutrition (severe: continue feeding supplements -nutritional service on board; will follow recommendations  Code Status: DNR Family Communication: brother at bedside Disposition Plan: once renal function stable and medical work up completed will discharge to SNF for rehab.   Consultants:  None   Procedures:  See below for x-ray reports  2-d echo pending  Antibiotics:  Rocephin 8/3>>  HPI/Subjective: Feeling better; patient is more oriented. No fever. Increase in urine output  Objective: Filed Vitals:   11/06/13 1156  BP:   Pulse:   Temp:  97.8 F (36.6 C)  Resp:     Intake/Output Summary (Last 24  hours) at 11/06/13 1322 Last data filed at 11/06/13 1100  Gross per 24 hour  Intake   1670 ml  Output   3350 ml  Net  -1680 ml   Filed Weights   11/05/13 2000 11/06/13 0500  Weight: 63.866 kg (140 lb 12.8 oz) 64.7 kg (142 lb 10.2 oz)    Exam:   General:  NAD, afebrile; AAOX2; feeling better. Reports mild pain in her left Knee  Cardiovascular: mild tachycardia, no murmurs, no rubs or gallops, S1 and s2  Respiratory: CTA bilaterally  Abdomen: soft, NT, ND, positive BS  Musculoskeletal: decrease movement and swelling on her left knee; incision in this area is clean, dry and withou signs of infection  Data Reviewed: Basic Metabolic Panel:  Recent Labs Lab 11/05/13 1535 11/05/13 1559 11/06/13 0821  NA 129* 126* 138  K 6.6* 6.4* 4.4  CL 88* 95* 99  CO2 22  --  25  GLUCOSE 135* 131* 113*  BUN 125* 129* 95*  CREATININE 6.62* 7.20* 2.42*  CALCIUM 9.4  --  9.1  PHOS  --   --  4.0   Liver Function Tests:  Recent Labs Lab 11/05/13 1535 11/06/13 0821  AST 21  --   ALT 12  --   ALKPHOS 103  --   BILITOT 1.1  --   PROT 7.0  --   ALBUMIN 3.2* 3.0*   CBC:  Recent Labs Lab 11/05/13 1535 11/05/13 1559 11/06/13 0342  WBC 13.0*  --  13.2*  NEUTROABS 11.6*  --   --   HGB 10.2* 11.9* 9.4*  HCT 31.2* 35.0* 28.2*  MCV 85.7  --  84.4  PLT 209  --  213   Cardiac Enzymes:  Recent Labs Lab 11/05/13 1535 11/05/13 2150 11/06/13 0343  TROPONINI <0.30 <0.30 <0.30   CBG: No results found for this basename: GLUCAP,  in the last 168 hours  Recent Results (from the past 240 hour(s))  URINE CULTURE     Status: None   Collection Time    11/05/13  3:37 PM      Result Value Ref Range Status   Specimen Description URINE, CATHETERIZED   Final   Special Requests Normal   Final   Culture  Setup Time     Final   Value: 11/05/2013 20:05     Performed at [facility]     Final   Value:  >=100,000 COLONIES/ML     Performed at [facility]   Culture     Final   Value: ESCHERICHIA COLI     Performed at [facility]   Report Status PENDING   Incomplete  CULTURE, BLOOD (ROUTINE X 2)     Status: None   Collection Time    11/05/13  5:32 PM      Result Value Ref Range Status   Specimen Description BLOOD RIGHT ANTECUBITAL   Final   Special Requests BOTTLES DRAWN AEROBIC AND ANAEROBIC 5ML   Final   Culture  Setup Time     Final   Value: 11/05/2013 22:52     Performed at [facility]   Culture     Final   Value:        BLOOD CULTURE RECEIVED NO GROWTH TO DATE CULTURE WILL BE HELD FOR 5 DAYS BEFORE ISSUING A FINAL NEGATIVE REPORT     Performed at [facility]   Report Status PENDING   Incomplete  MRSA PCR SCREENING  Status: None   Collection Time    11/05/13  7:09 PM      Result Value Ref Range Status   MRSA by PCR NEGATIVE  NEGATIVE Final   Comment:            The GeneXpert MRSA Assay (FDA     approved for NASAL specimens     only), is one component of a     comprehensive MRSA colonization     surveillance program. It is not     intended to diagnose MRSA     infection nor to guide or     monitor treatment for     MRSA infections.     Studies: US Renal  11/05/2013   CLINICAL DATA:  Acute renal failure  EXAM: RENAL/URINARY TRACT ULTRASOUND COMPLETE  COMPARISON:  None.  FINDINGS: Right Kidney:  Length: 10.0 cm. 2 cm lower pole cyst. 1 cm midpole cyst. Echogenicity within normal limits. No mass or hydronephrosis visualized.  Left Kidney:  Length: 9.8 cm. Lower pole cyst measuring 15 mm. Echogenicity within normal limits. No mass or hydronephrosis visualized.  Bladder:  Foley catheter in the bladder.  Urinary bladder is empty.  IMPRESSION: Negative for renal obstruction.  Normal renal size.   Electronically Signed   By: Franchot Gallo M.D.   On: 11/05/2013 20:04   Dg Chest Port 1 View  11/05/2013   CLINICAL DATA:  Asthma and chest pain ;  breast carcinoma  EXAM: PORTABLE CHEST - 1 VIEW  COMPARISON:  February 15, 2013  FINDINGS: There is no edema or consolidation. Patchy interstitial fibrosis remains without change. Heart is upper normal in size with pulmonary vascularity within normal limits. No adenopathy. There is atherosclerotic change in aorta. Bones are somewhat osteoporotic. There is evidence of prior trauma in the lateral right clavicle with widening of the acromioclavicular joint on the right.  IMPRESSION: Underlying patchy interstitial fibrosis. No edema or consolidation. Widening of the acromioclavicular joint on the right, more apparent than on prior study.   Electronically Signed   By: Lowella Grip M.D.   On: 11/05/2013 15:24    Scheduled Meds: . sodium chloride   Intravenous STAT  . anastrozole  1 mg Oral Daily  . cefTRIAXone (ROCEPHIN)  IV  1 g Intravenous Q24H  . feeding supplement (ENSURE COMPLETE)  237 mL Oral BID BM  . latanoprost  1 drop Both Eyes QHS  . pantoprazole  40 mg Oral BID  . predniSONE  5 mg Oral Q breakfast   Continuous Infusions:   Active Problems:   Cancer of central portion of female breast   Thyroid disease   UTI (urinary tract infection)   GERD (gastroesophageal reflux disease)   Urinary retention   Dehydration   Acute renal failure   Encephalopathy acute   Hyperkalemia   Coffee ground emesis   Nausea & vomiting   Renal failure   Protein-calorie malnutrition, severe    Time spent: >30 minutes   Barton Dubois  [facility] Pager [phone]. If 7PM-7AM, please contact night-coverage at [email or web address], password [facility] 11/06/2013, 1:22 PM  LOS: 1 day

## 2013-11-06 NOTE — Progress Notes (Signed)
Echocardiogram 2D Echocardiogram has been performed.  BROWN, CALEBA 11/06/2013, 11:34 AM

## 2013-11-06 NOTE — Progress Notes (Signed)
Pt refusing lab work this am. Pt states "no more, until her sister and brother are present".

## 2013-11-06 NOTE — Progress Notes (Signed)
Attempted to call brother and sister for the pt. Brother's phone call went to voicemail. Sisters's phone line started to ring while in the pt's room and phone was handed to the pt. Unsure if pt was able to speak to her sister. Nurse attempted to call sister but phone line was busy.

## 2013-11-06 NOTE — Progress Notes (Signed)
Clinical Social Work Department CLINICAL SOCIAL WORK PLACEMENT NOTE 11/06/2013  Patient:  Natasha Fletcher, Natasha Fletcher  Account Number:  [id number] Admit date:  11/05/2013  Clinical Social Worker:  Ulyess Blossom  Date/time:  11/06/2013 11:00 AM  Clinical Social Work is seeking post-discharge placement for this patient at the following level of care:   [place]   (*CSW will update this form in Epic as items are completed)   11/06/2013  Patient/family provided with [place] Department of Clinical Social Work's list of facilities offering this level of care within the geographic area requested by the patient (or if unable, by the patient's family).  11/06/2013  Patient/family informed of their freedom to choose among providers that offer the needed level of care, that participate in Medicare, Medicaid or managed care program needed by the patient, have an available bed and are willing to accept the patient.  11/06/2013  Patient/family informed of [facility]' ownership interest in [facility], as well as of the fact that they are under no obligation to receive care at this facility.  PASARR submitted to EDS on 11/06/2013 PASARR number received on 11/06/2013  FL2 transmitted to all facilities in geographic area requested by pt/family on  11/06/2013 FL2 transmitted to all facilities within larger geographic area on   Patient informed that his/her managed care company has contracts with or will negotiate with  certain facilities, including the following:     Patient/family informed of bed offers received:   Patient chooses bed at  Physician recommends and patient chooses bed at    Patient to be transferred to  on   Patient to be transferred to facility by  Patient and family notified of transfer on  Name of family member notified:    The following physician request were entered in Epic:   Additional Comments:   Alison Murray, MSW, [place] [facility] [phone]

## 2013-11-06 NOTE — Progress Notes (Signed)
Subjective: POD 15 from her Left Total Knee by Dr. Wynelle Link. Patient reports pain as mild.   Patient seen in rounds with Dr. Wynelle Link. Husband in room at bedside in the [facility] Patient is well, but has had some minor complaints of pain in the knee, requiring pain medications Plan is likely to go Skilled nursing facility after hospital stay.  Objective: Vital signs in last 24 hours: Temp:  [97.9 F (36.6 C)-98.6 F (37 C)] 98.5 F (36.9 C) (08/04 0751) Pulse Rate:  [95-106] 105 (08/04 0800) Resp:  [15-32] 16 (08/04 0800) BP: (94-142)/(34-61) 132/48 mmHg (08/04 0800) SpO2:  [92 %-100 %] 100 % (08/04 0800) Weight:  [63.866 kg (140 lb 12.8 oz)-64.7 kg (142 lb 10.2 oz)] 64.7 kg (142 lb 10.2 oz) (08/04 0500)  Intake/Output from previous day:  Intake/Output Summary (Last 24 hours) at 11/06/13 0911 Last data filed at 11/06/13 0900  Gross per 24 hour  Intake   1550 ml  Output   2950 ml  Net  -1400 ml    Intake/Output this shift: Total I/O In: 200 [I.V.:200] Out: -   Labs:  Recent Labs  11/05/13 1535 11/05/13 1559 11/06/13 0342  HGB 10.2* 11.9* 9.4*    Recent Labs  11/05/13 1535 11/05/13 1559 11/06/13 0342  WBC 13.0*  --  13.2*  RBC 3.64*  --  3.34*  HCT 31.2* 35.0* 28.2*  PLT 209  --  213    Recent Labs  11/05/13 1535 11/05/13 1559  NA 129* 126*  K 6.6* 6.4*  CL 88* 95*  CO2 22  --   BUN 125* 129*  CREATININE 6.62* 7.20*  GLUCOSE 135* 131*  CALCIUM 9.4  --     Recent Labs  11/05/13 1535  INR 1.31    EXAM General - Patient is Alert, Appropriate and but not specific to events Extremity - Neurovascular intact Sensation intact distally Dressing/Incision - clean, dry, no drainage Motor Function - intact, moving foot and toes well on exam.   Past Medical History  Diagnosis Date  . Hypertension   . Asthma   . Glaucoma   . Incontinence   . Wears dentures   . Wears glasses   . Thyroid disease     "something years ago"  . GERD  (gastroesophageal reflux disease)   . Cancer     rt breast  . Edema leg   . Fast breathing     "because of pill"  . Pneumonia     h/o younger  . Arthritis     "all over"  . S/P total knee arthroplasty   . Glaucoma   . Swelling     bilateral feet/ legs, more left.    Assessment/Plan: POD 15 from her Left Total Knee by Dr. Wynelle Link. Active Problems:   Cancer of central portion of female breast   Thyroid disease   UTI (urinary tract infection)   GERD (gastroesophageal reflux disease)   Urinary retention   Dehydration   Acute renal failure   Encephalopathy acute   Hyperkalemia   Coffee ground emesis   Nausea & vomiting   Renal failure  Estimated body mass index is 23.03 kg/(m^2) as calculated from the following:   Height as of this encounter: 5\' 6"  (1.676 m).   Weight as of this encounter: 64.7 kg (142 lb 10.2 oz). Discharge to SNF will likely be the best alternative at this time. Husband mentioned about talking to a Education officer, museum.   Weight-Bearing as tolerated to left  leg when able to participate with therapy.  Arlee Muslim, PA-C Orthopaedic Surgery 11/06/2013, 9:11 AM

## 2013-11-07 LAB — URINE CULTURE
Colony Count: >=100000
Special Requests: NORMAL

## 2013-11-07 LAB — RENAL FUNCTION PANEL
ALBUMIN: 2.6 g/dL — AB (ref 3.5–5.2)
ANION GAP: 10 (ref 5–15)
BUN: 56 mg/dL — ABNORMAL HIGH (ref 6–23)
CHLORIDE: 109 meq/L (ref 96–112)
CO2: 27 mEq/L (ref 19–32)
Calcium: 8.4 mg/dL (ref 8.4–10.5)
Creatinine, Ser: 1 mg/dL (ref 0.50–1.10)
GFR, EST AFRICAN AMERICAN: 57 mL/min — AB (ref >90)
GFR, EST NON AFRICAN AMERICAN: 50 mL/min — AB (ref >90)
Glucose, Bld: 98 mg/dL (ref 70–99)
PHOSPHORUS: 2.5 mg/dL (ref 2.3–4.6)
POTASSIUM: 4 meq/L (ref 3.7–5.3)
SODIUM: 146 meq/L (ref 137–147)

## 2013-11-07 NOTE — Progress Notes (Signed)
Patient ID: Natasha Fletcher, female   DOB: 06/10/1925, 80 y.o.   MRN: [id number] [facility] PROGRESS NOTE  Natasha Fletcher YJE:[id number] DOB: [birth year] DOA: 11/05/2013 PCP: Myriam Jacobson, MD  Brief narrative: 80 year old female with past medical history of hypertension, asthma, rheumatoid arthritis, recent left knee total arthroplasty who presented from home to [facility] ED 11/05/2013 with mental status changes, abdominal pain, nausea and vomiting and decreased by mouth intake. Patient's family also reported patient being increasingly confused and somnolent. In ED, patient was found to have acute renal failure and creatinine of 7.2, potassium of 6.4, evidence of urinary tract infection on urinalysis. In addition patient was severely dehydrated and had elevated lactic acid and mild elevation of troponins. Patient was given fluid resuscitation on the admission, calcium gluconate, Kayexalate, insulin and sodium bicarbonate as part of the treatment for hyperkalemia. She was started on empiric Rocephin for treatment of urinary tract infection.   Assessment/Plan:    Assessment/Plan:   Principal problem: Acute renal failure / Dehydration  Acute renal failure is likely prerenal secondary to severe dehydration and urinary tract infection.  Renal ultrasound showed no renal obstruction and a normal kidney size.  Creatinine was 7.2 on the admission but has improved to normal number with IV fluids and treatment of UTI.  Encourage by mouth intake. May continue IV fluids until by mouth intake is little bit better. Active problems: Escherichia coli urinary tract infection.  Continue Rocephin. Culture pan sensitive. We'll change to by mouth regimen prior to discharge. Coffee-ground emesis  Now resolved. Most likely Mallory-Weiss tears from ongoing vomiting on the admission. Continue Protonix. Acute metabolic encephalopathy  Likely related to severe dehydration and UTI.  Patient is alert and  oriented to time, place and person this morning.  Per PT evaluation, recommendation is for skilled nursing facility placement. 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 History of breast cancer  Continue anastrozole GERD  Continue Protonix History of thyroid disease  TSH is within normal limits. Hyperkalemia  As mentioned above, patient has received calcium gluconate, Kayexalate, insulin and bicarbonate. Potassium is within normal limits Moderate protein calorie malnutrition  Continue ensure supplements DVT Prophylaxis   SCD' s bialterally due to initial coffee ground emesis and risk of bleeding   Code Status: DNR/DNI Family Communication:  plan of care discussed with the patient Disposition Plan: to SNF in next 24 hours   IV Access:   Peripheral IV Procedures and diagnostic studies:    US Renal  11/05/2013   MPRESSION: Negative for renal obstruction.  Normal renal size.     Dg Chest Port 1 View  11/05/2013   IMPRESSION: Underlying patchy interstitial fibrosis. No edema or consolidation. Widening of the acromioclavicular joint on the right, more apparent than on prior study.   Medical Consultants:   None  Other Consultants:   Nutrition  Physical therapy Social work  Anti-Infectives:   Rocephin 11/05/2013 -->   Leisa Lenz, MD  [facility] Pager [phone]  If 7PM-7AM, please contact night-coverage [email or web address] Password [email or web address] 11/07/2013, 12:18 PM   LOS: 2 days    HPI/Subjective: No acute overnight events.  Objective: Filed Vitals:   11/06/13 1500 11/06/13 1601 11/06/13 2109 11/07/13 0435  BP: 127/39 92/58 134/60 134/54  Pulse: 95 95 90 87  Temp:  98.1 F (36.7 C) 98.3 F (36.8 C) 98.2 F (36.8 C)  TempSrc:  Oral Oral Oral  Resp: 16 16 16 20   Height:      Weight:  63.3 kg (139 lb 8.8 oz)  SpO2: 100% 100% 100% 100%    Intake/Output Summary (Last 24 hours) at 11/07/13 1218 Last data filed at 11/07/13 0900  Gross per 24 hour  Intake   2265  ml  Output   1400 ml  Net    865 ml    Exam:   General:  Pt is alert, follows commands appropriately, not in acute distress  Cardiovascular: Regular rate and rhythm, S1/S22 appreciated  Respiratory: Clear to auscultation bilaterally, no wheezing  Abdomen: Soft, non tender, non distended, bowel sounds present  Extremities: No edema, pulses DP and PT palpable bilaterally  Neuro: Grossly nonfocal  Data Reviewed: Basic Metabolic Panel:  Recent Labs Lab 11/05/13 1535 11/05/13 1559 11/06/13 0821 11/07/13 0400  NA 129* 126* 138 146  K 6.6* 6.4* 4.4 4.0  CL 88* 95* 99 109  CO2 22  --  25 27  GLUCOSE 135* 131* 113* 98  BUN 125* 129* 95* 56*  CREATININE 6.62* 7.20* 2.42* 1.00  CALCIUM 9.4  --  9.1 8.4  PHOS  --   --  4.0 2.5   Liver Function Tests:  Recent Labs Lab 11/05/13 1535 11/06/13 0821 11/07/13 0400  AST 21  --   --   ALT 12  --   --   ALKPHOS 103  --   --   BILITOT 1.1  --   --   PROT 7.0  --   --   ALBUMIN 3.2* 3.0* 2.6*   No results found for this basename: LIPASE, AMYLASE,  in the last 168 hours No results found for this basename: AMMONIA,  in the last 168 hours CBC:  Recent Labs Lab 11/05/13 1535 11/05/13 1559 11/06/13 0342  WBC 13.0*  --  13.2*  NEUTROABS 11.6*  --   --   HGB 10.2* 11.9* 9.4*  HCT 31.2* 35.0* 28.2*  MCV 85.7  --  84.4  PLT 209  --  213   Cardiac Enzymes:  Recent Labs Lab 11/05/13 1535 11/05/13 2150 11/06/13 0343  TROPONINI <0.30 <0.30 <0.30   BNP: No components found with this basename: POCBNP,  CBG: No results found for this basename: GLUCAP,  in the last 168 hours  URINE CULTURE     Status: None   Collection Time    11/05/13  3:37 PM      Result Value Ref Range Status   Specimen Description URINE, CATHETERIZED   Final   Organism ID, Bacteria ESCHERICHIA COLI   Final  CULTURE, BLOOD (ROUTINE X 2)     Status: None   Collection Time    11/05/13  5:32 PM      Result Value Ref Range Status   Specimen  Description BLOOD RIGHT ANTECUBITAL   Final   Value:        BLOOD CULTURE RECEIVED NO GROWTH TO DATE CULTURE WILL BE HELD FOR 5 DAYS BEFORE ISSUING A FINAL NEGATIVE REPORT     Performed at [facility]   Report Status PENDING   Incomplete  MRSA PCR SCREENING     Status: None   Collection Time    11/05/13  7:09 PM      Result Value Ref Range Status   MRSA by PCR NEGATIVE  NEGATIVE Final  CULTURE, BLOOD (ROUTINE X 2)     Status: None   Collection Time    11/05/13  7:30 PM      Result Value Ref Range Status   Specimen Description BLOOD LEFT  ARM   Final   Value:        BLOOD CULTURE RECEIVED NO GROWTH TO DATE CULTURE WILL BE HELD FOR 5 DAYS BEFORE ISSUING A FINAL NEGATIVE REPORT     Performed at [facility]   Report Status PENDING   Incomplete     Scheduled Meds: . anastrozole  1 mg Oral Daily  . cefTRIAXone   1 g Intravenous Q24H  . feeding supplement (ENSURE COMPLETE)  237 mL Oral BID BM  . pantoprazole  40 mg Oral BID  . predniSONE  5 mg Oral Q breakfast   Continuous Infusions: . sodium chloride 75 mL/hr at 11/07/13 0615        And in an

## 2013-11-07 NOTE — Progress Notes (Signed)
CSW provided bed offers - patient had recently discharged from [facility] on 7/31. Will follow-up tomorrow for SNF decision.   Clinical Social Work Department CLINICAL SOCIAL WORK PLACEMENT NOTE 11/07/2013  Patient:  Natasha Fletcher, Natasha Fletcher  Account Number:  [id number] Admit date:  11/05/2013  Clinical Social Worker:  Ulyess Blossom  Date/time:  11/06/2013 11:00 AM  Clinical Social Work is seeking post-discharge placement for this patient at the following level of care:   [place]   (*CSW will update this form in Epic as items are completed)   11/06/2013  Patient/family provided with [place] Department of Clinical Social Work's list of facilities offering this level of care within the geographic area requested by the patient (or if unable, by the patient's family).  11/06/2013  Patient/family informed of their freedom to choose among providers that offer the needed level of care, that participate in Medicare, Medicaid or managed care program needed by the patient, have an available bed and are willing to accept the patient.  11/06/2013  Patient/family informed of [facility]' ownership interest in [facility], as well as of the fact that they are under no obligation to receive care at this facility.  PASARR submitted to EDS on 11/06/2013 PASARR number received on 11/06/2013  FL2 transmitted to all facilities in geographic area requested by pt/family on  11/06/2013 FL2 transmitted to all facilities within larger geographic area on   Patient informed that his/her managed care company has contracts with or will negotiate with  certain facilities, including the following:     Patient/family informed of bed offers received:  11/07/2013 Patient chooses bed at  Physician recommends and patient chooses bed at    Patient to be transferred to  on   Patient to be transferred to facility by  Patient and family notified of transfer on  Name of family member notified:     The following physician request were entered in Epic:   Additional Comments:   Raynaldo Opitz, [place] Social Worker cell #: [phone]

## 2013-11-08 DIAGNOSIS — N178 Other acute kidney failure: Secondary | ICD-10-CM

## 2013-11-08 LAB — RENAL FUNCTION PANEL
Albumin: 2.5 g/dL — ABNORMAL LOW (ref 3.5–5.2)
Anion gap: 9 (ref 5–15)
BUN: 25 mg/dL — ABNORMAL HIGH (ref 6–23)
CALCIUM: 8.2 mg/dL — AB (ref 8.4–10.5)
CO2: 25 mEq/L (ref 19–32)
CREATININE: 0.72 mg/dL (ref 0.50–1.10)
Chloride: 112 mEq/L (ref 96–112)
GFR, EST AFRICAN AMERICAN: 88 mL/min — AB (ref >90)
GFR, EST NON AFRICAN AMERICAN: 76 mL/min — AB (ref >90)
Glucose, Bld: 95 mg/dL (ref 70–99)
PHOSPHORUS: 1.8 mg/dL — AB (ref 2.3–4.6)
Potassium: 3.6 mEq/L — ABNORMAL LOW (ref 3.7–5.3)
SODIUM: 146 meq/L (ref 137–147)

## 2013-11-08 MED ORDER — ONDANSETRON HCL 4 MG PO TABS: 4.0000 mg | ORAL_TABLET | Freq: Four times a day (QID) | ORAL | Status: DC | PRN

## 2013-11-08 MED ORDER — OXYCODONE HCL 5 MG PO TABS: 5.0000 mg | ORAL_TABLET | ORAL | Status: DC | PRN

## 2013-11-08 MED ORDER — TRAMADOL HCL 50 MG PO TABS: 50.0000 mg | ORAL_TABLET | Freq: Four times a day (QID) | ORAL | Status: DC | PRN

## 2013-11-08 MED ORDER — AMLODIPINE BESYLATE 5 MG PO TABS: 5.0000 mg | ORAL_TABLET | Freq: Every day | ORAL | Status: DC

## 2013-11-08 MED ORDER — ENSURE COMPLETE PO LIQD: 237.0000 mL | Freq: Two times a day (BID) | ORAL | Status: DC

## 2013-11-08 MED ORDER — ACETAMINOPHEN 325 MG PO TABS: 650.0000 mg | ORAL_TABLET | Freq: Four times a day (QID) | ORAL | Status: DC | PRN

## 2013-11-08 MED ORDER — CIPROFLOXACIN HCL 500 MG PO TABS: 500.0000 mg | ORAL_TABLET | Freq: Two times a day (BID) | ORAL | Status: DC

## 2013-11-08 MED ORDER — PANTOPRAZOLE SODIUM 40 MG PO TBEC: 40.0000 mg | DELAYED_RELEASE_TABLET | Freq: Two times a day (BID) | ORAL | Status: DC

## 2013-11-08 MED ORDER — POTASSIUM CHLORIDE CRYS ER 20 MEQ PO TBCR
40.0000 meq | EXTENDED_RELEASE_TABLET | Freq: Once | ORAL | Status: AC
  Administered 2013-11-08: 40 meq via ORAL
  Filled 2013-11-08: qty 2

## 2013-11-08 NOTE — Discharge Instructions (Signed)

## 2013-11-08 NOTE — Discharge Summary (Signed)
Physician Discharge Summary  Natasha Fletcher ZJQ:[id number] DOB: [birth year] DOA: 11/05/2013  PCP: Myriam Jacobson, MD  Admit date: 11/05/2013 Discharge date: 11/08/2013  Recommendations for Outpatient Follow-up:  1. Take cipro for 4 more days on discharge for E.Coli UTI. 2.Potassium repleted prior to discharge. CHeck potassium level in next 3 days from discharge to make sure it is within normal limits. 3. Use Norvasc 5 mg daily for blood pressure control instead of lisinopril. Lisinopril may have contributed to renal failure. Renal function normalized prior to discharge. Lisinopril was on hold.  Discharge Diagnoses:  Active Problems:   Cancer of central portion of female breast   Thyroid disease   UTI (urinary tract infection)   GERD (gastroesophageal reflux disease)   Urinary retention   Dehydration   Acute renal failure   Encephalopathy acute   Hyperkalemia   Coffee ground emesis   Nausea & vomiting   Renal failure   Protein-calorie malnutrition, severe    Discharge Condition: stable   Diet recommendation: as tolerated   History of present illness:  80 year old female with past medical history of hypertension, asthma, rheumatoid arthritis, recent left knee total arthroplasty who presented from home to [facility] ED 11/05/2013 with mental status changes, abdominal pain, nausea and vomiting and decreased by mouth intake. Patient's family also reported patient being increasingly confused and somnolent. In ED, patient was found to have acute renal failure and creatinine of 7.2, potassium of 6.4, evidence of urinary tract infection on urinalysis. In addition patient was severely dehydrated and had elevated lactic acid and mild elevation of troponins. Patient was given fluid resuscitation on the admission, calcium gluconate, Kayexalate, insulin and sodium bicarbonate as part of the treatment for hyperkalemia. She was started on empiric Rocephin for treatment of urinary tract infection.    Assessment/Plan:    Assessment/Plan:  Principal problem:  Acute renal failure / Dehydration  Acute renal failure is likely prerenal secondary to severe dehydration and urinary tract infection.  Renal ultrasound showed no renal obstruction and a normal kidney size.  Creatinine was 7.2 on the admission but has improved to normal number with IV fluids and treatment of UTI.  Tolerated PO intake over past 24-48 hours. Active problems:  Escherichia coli urinary tract infection.  Culture pan sensitive. Cipro on discharge for 4 more days. Was on rocephin until today. Coffee-ground emesis  Now resolved. Most likely Mallory-Weiss tears from ongoing vomiting on the admission. Continue Protonix. Acute metabolic encephalopathy  Likely related to severe dehydration and UTI.  Patient is alert and oriented to time, place and person  Per PT evaluation, recommendation is for skilled nursing facility placement. 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 History of breast cancer  Continue anastrozole Hypertension  Norvasc 5 mg daily. Lisinopril on hold due to renal insuifficiency GERD  Continue Protonix History of thyroid disease  TSH is within normal limits. Hyperkalemia  As mentioned above, patient has received calcium gluconate, Kayexalate, insulin and bicarbonate. Potassium is within normal limits Moderate protein calorie malnutrition  Continue ensure supplements DVT Prophylaxis  SCD' s bialterally due to initial coffee ground emesis and risk of bleeding    Code Status: DNR/DNI  Family Communication: plan of care discussed with the patient   IV Access:   Peripheral IV Procedures and diagnostic studies:   US Renal 11/05/2013 MPRESSION: Negative for renal obstruction. Normal renal size.  Dg Chest Port 1 View 11/05/2013 IMPRESSION: Underlying patchy interstitial fibrosis. No edema or consolidation. Widening of the acromioclavicular joint on the  right, more apparent than on prior study.   Medical Consultants:   None  Other Consultants:   Nutrition  Physical therapy  Social work  Anti-Infectives:   Rocephin 11/05/2013 --> 11/08/2013 Cipro on discahrge for 4 more days   Signed:  Leisa Lenz, MD  [facility] 11/08/2013, 1:24 PM  Pager #: [phone]   Discharge Exam: Filed Vitals:   11/08/13 1319  BP: 148/63  Pulse: 88  Temp: 98.2 F (36.8 C)  Resp: 16   Filed Vitals:   11/07/13 2132 11/08/13 0055 11/08/13 0451 11/08/13 1319  BP: 128/49  144/65 148/63  Pulse: 89  79 88  Temp: 98.3 F (36.8 C)  98.1 F (36.7 C) 98.2 F (36.8 C)  TempSrc: Oral  Oral Oral  Resp: 18  18 16   Height:      Weight:  63.8 kg (140 lb 10.5 oz)    SpO2:   100% 98%    General: Pt is alert, follows commands appropriately, not in acute distress Cardiovascular: Regular rate and rhythm, S1/S2 +, no murmurs Respiratory: Clear to auscultation bilaterally, no wheezing, no crackles, no rhonchi Abdominal: Soft, non tender, non distended, bowel sounds +, no guarding Extremities: no edema, no cyanosis, pulses palpable bilaterally DP and PT Neuro: Grossly nonfocal  Discharge Instructions  Discharge Instructions   Call MD for:  difficulty breathing, headache or visual disturbances    Complete by:  As directed      Call MD for:  persistant dizziness or light-headedness    Complete by:  As directed      Call MD for:  persistant nausea and vomiting    Complete by:  As directed      Call MD for:  severe uncontrolled pain    Complete by:  As directed      Diet - low sodium heart healthy    Complete by:  As directed      Discharge instructions    Complete by:  As directed   1. Take cipro for 4 more days on discharge for E.Coli UTI. 2.Potassium repleted prior to discharge. CHeck potassium level in next 3 days from discharge to make sure it is within normal limits. 3. Use Norvasc 5 mg daily for blood pressure control instead of lisinopril. Lisinopril may have contributed to renal  failure. Renal function normalized prior to discharge. Lisinopril was on hold.     Increase activity slowly    Complete by:  As directed             Medication List    STOP taking these medications       lisinopril 20 MG tablet  Commonly known as:  PRINIVIL,ZESTRIL     nitrofurantoin (macrocrystal-monohydrate) 100 MG capsule  Commonly known as:  MACROBID      TAKE these medications       acetaminophen 325 MG tablet  Commonly known as:  TYLENOL  Take 2 tablets (650 mg total) by mouth every 6 (six) hours as needed for mild pain (or Fever >/= 101).     amLODipine 5 MG tablet  Commonly known as:  NORVASC  Take 1 tablet (5 mg total) by mouth daily.     anastrozole 1 MG tablet  Commonly known as:  ARIMIDEX  Take 1 tablet (1 mg total) by mouth daily.     ciprofloxacin 500 MG tablet  Commonly known as:  CIPRO  Take 1 tablet (500 mg total) by mouth 2 (two) times daily.     feeding  supplement (ENSURE COMPLETE) Liqd  Take 237 mLs by mouth 2 (two) times daily between meals.     latanoprost 0.005 % ophthalmic solution  Commonly known as:  XALATAN  Place 1 drop into both eyes at bedtime.     ondansetron 4 MG tablet  Commonly known as:  ZOFRAN  Take 1 tablet (4 mg total) by mouth every 6 (six) hours as needed for nausea.     oxyCODONE 5 MG immediate release tablet  Commonly known as:  Oxy IR/ROXICODONE  Take 1-2 tablets (5-10 mg total) by mouth every 4 (four) hours as needed for severe pain.     pantoprazole 40 MG tablet  Commonly known as:  PROTONIX  Take 1 tablet (40 mg total) by mouth 2 (two) times daily.     predniSONE 5 MG tablet  Commonly known as:  DELTASONE  Take 5 mg by mouth daily with breakfast.     traMADol 50 MG tablet  Commonly known as:  ULTRAM  Take 1-2 tablets (50-100 mg total) by mouth every 6 (six) hours as needed (mild pain).            Follow-up Information   Follow up with ROBERTS, [name], MD. Schedule an appointment as soon as  possible for a visit in 2 weeks. (Follow up appt after recent hospitalization)    Specialty:  Internal Medicine   Contact information:   [address] [place] [address] [phone]        The results of significant diagnostics from this hospitalization (including imaging, microbiology, ancillary and laboratory) are listed below for reference.    Significant Diagnostic Studies: US Renal  11/05/2013   CLINICAL DATA:  Acute renal failure  EXAM: RENAL/URINARY TRACT ULTRASOUND COMPLETE  COMPARISON:  None.  FINDINGS: Right Kidney:  Length: 10.0 cm. 2 cm lower pole cyst. 1 cm midpole cyst. Echogenicity within normal limits. No mass or hydronephrosis visualized.  Left Kidney:  Length: 9.8 cm. Lower pole cyst measuring 15 mm. Echogenicity within normal limits. No mass or hydronephrosis visualized.  Bladder:  Foley catheter in the bladder.  Urinary bladder is empty.  IMPRESSION: Negative for renal obstruction.  Normal renal size.   Electronically Signed   By: Franchot Gallo M.D.   On: 11/05/2013 20:04   Dg Chest Port 1 View  11/05/2013   CLINICAL DATA:  Asthma and chest pain ; breast carcinoma  EXAM: PORTABLE CHEST - 1 VIEW  COMPARISON:  February 15, 2013  FINDINGS: There is no edema or consolidation. Patchy interstitial fibrosis remains without change. Heart is upper normal in size with pulmonary vascularity within normal limits. No adenopathy. There is atherosclerotic change in aorta. Bones are somewhat osteoporotic. There is evidence of prior trauma in the lateral right clavicle with widening of the acromioclavicular joint on the right.  IMPRESSION: Underlying patchy interstitial fibrosis. No edema or consolidation. Widening of the acromioclavicular joint on the right, more apparent than on prior study.   Electronically Signed   By: Lowella Grip M.D.   On: 11/05/2013 15:24    Microbiology: Recent Results (from the past 240 hour(s))  URINE CULTURE     Status: None   Collection Time     11/05/13  3:37 PM      Result Value Ref Range Status   Specimen Description URINE, CATHETERIZED   Final   Special Requests Normal   Final   Culture  Setup Time     Final   Value: 11/05/2013 20:05  Performed at [facility]     Final   Value: >=100,000 COLONIES/ML     Performed at [facility]   Culture     Final   Value: ESCHERICHIA COLI     Performed at [facility]   Report Status 11/07/2013 FINAL   Final   Organism ID, Bacteria ESCHERICHIA COLI   Final  CULTURE, BLOOD (ROUTINE X 2)     Status: None   Collection Time    11/05/13  5:32 PM      Result Value Ref Range Status   Specimen Description BLOOD RIGHT ANTECUBITAL   Final   Special Requests BOTTLES DRAWN AEROBIC AND ANAEROBIC 5ML   Final   Culture  Setup Time     Final   Value: 11/05/2013 22:52     Performed at [facility]   Culture     Final   Value:        BLOOD CULTURE RECEIVED NO GROWTH TO DATE CULTURE WILL BE HELD FOR 5 DAYS BEFORE ISSUING A FINAL NEGATIVE REPORT     Performed at [facility]   Report Status PENDING   Incomplete  MRSA PCR SCREENING     Status: None   Collection Time    11/05/13  7:09 PM      Result Value Ref Range Status   MRSA by PCR NEGATIVE  NEGATIVE Final  CULTURE, BLOOD (ROUTINE X 2)     Status: None   Collection Time    11/05/13  7:30 PM      Result Value Ref Range Status   Specimen Description BLOOD LEFT ARM   Final   Special Requests BOTTLES DRAWN AEROBIC ONLY 4CC   Final   Culture  Setup Time     Final   Value: 11/06/2013 01:17     Performed at [facility]   Culture     Final   Value:        BLOOD CULTURE RECEIVED NO GROWTH TO DATE CULTURE WILL BE HELD FOR 5 DAYS BEFORE ISSUING A FINAL NEGATIVE REPORT     Performed at [facility]   Report Status PENDING   Incomplete     Labs: Basic Metabolic Panel:  Recent Labs Lab 11/05/13 1535 11/05/13 1559 11/06/13 0821 11/07/13 0400 11/08/13 0353  NA 129* 126*  138 146 146  K 6.6* 6.4* 4.4 4.0 3.6*  CL 88* 95* 99 109 112  CO2 22  --  25 27 25   GLUCOSE 135* 131* 113* 98 95  BUN 125* 129* 95* 56* 25*  CREATININE 6.62* 7.20* 2.42* 1.00 0.72  CALCIUM 9.4  --  9.1 8.4 8.2*  PHOS  --   --  4.0 2.5 1.8*   Liver Function Tests:  Recent Labs Lab 11/05/13 1535 11/06/13 0821 11/07/13 0400 11/08/13 0353  AST 21  --   --   --   ALT 12  --   --   --   ALKPHOS 103  --   --   --   BILITOT 1.1  --   --   --   PROT 7.0  --   --   --   ALBUMIN 3.2* 3.0* 2.6* 2.5*   No results found for this basename: LIPASE, AMYLASE,  in the last 168 hours No results found for this basename: AMMONIA,  in the last 168 hours CBC:  Recent Labs Lab 11/05/13 1535 11/05/13 1559 11/06/13 0342  WBC 13.0*  --  13.2*  NEUTROABS 11.6*  --   --   HGB 10.2* 11.9* 9.4*  HCT 31.2* 35.0* 28.2*  MCV 85.7  --  84.4  PLT 209  --  213   Cardiac Enzymes:  Recent Labs Lab 11/05/13 1535 11/05/13 2150 11/06/13 0343  TROPONINI <0.30 <0.30 <0.30   BNP: BNP (last 3 results) No results found for this basename: PROBNP,  in the last 8760 hours CBG: No results found for this basename: GLUCAP,  in the last 168 hours  Time coordinating discharge: Over 30 minutes

## 2013-11-08 NOTE — Progress Notes (Signed)
Physical Therapy Treatment Patient Details Name: Natasha Fletcher MRN: [id number] DOB: October 03, 1925 Today's Date: 11/08/2013    History of Present Illness 80 yo female admitted 11/05/13 with confusion, N/V , abdominal pain. Found to be, in acute renal failure, hyperkalemia,  uti. Pt s/p LTKA 10/15/13. [facility]    PT Comments    Pt continues to require increased time for mobility and cues for safety.  Pt assisted to recliner and performed a few exercises then felt ready to ambulate short distance.  Follow Up Recommendations  SNF     Equipment Recommendations  None recommended by PT    Recommendations for Other Services       Precautions / Restrictions Precautions Precautions: Fall;Knee Restrictions Other Position/Activity Restrictions: WBAT    Mobility  Bed Mobility Overal bed mobility: Needs Assistance Bed Mobility: Supine to Sit;Rolling Rolling: Min assist   Supine to sit: Min assist     General bed mobility comments: pt reported need to have BM and could not make it to the [facility] in time so used bed pan, pt required min assist at hips to complete roll, assist for trunk upright upon sitting  Transfers Overall transfer level: Needs assistance Equipment used: Rolling walker (2 wheeled) Transfers: Sit to/from [facility] Sit to Stand: Mod assist Stand pivot transfers: Min assist       General transfer comment: verbal cues for safe technique, upon standing pt reported fatigue and wished to pivot to recliner for break then continue with ambulation, mod assist to rise and control descent  Ambulation/Gait Ambulation/Gait assistance: Min assist Ambulation Distance (Feet): 20 Feet Assistive device: Rolling walker (2 wheeled) Gait Pattern/deviations: Trunk flexed;Step-through pattern;Decreased stride length Gait velocity: decreased   General Gait Details: verbal cues for safe technique, pt fatigues quickly limiting distance, increased reliance  on RW for support   Stairs            Wheelchair Mobility    Modified Rankin (Stroke Patients Only)       Balance                                    Cognition Arousal/Alertness: Awake/alert Behavior During Therapy: WFL for tasks assessed/performed Overall Cognitive Status: Within Functional Limits for tasks assessed                      Exercises Total Joint Exercises Ankle Circles/Pumps: AROM;Both;15 reps;Supine Quad Sets: AROM;10 reps;Left Heel Slides: Left;AROM;10 reps Long Arc Quad: AROM;10 reps;Both    General Comments        Pertinent Vitals/Pain Pt reports no pain, however did c/o muscle spasm R thigh with rolling    Home Living                      Prior Function            PT Goals (current goals can now be found in the care plan section) Progress towards PT goals: Progressing toward goals    Frequency  Min 3X/week    PT Plan Current plan remains appropriate    Co-evaluation             End of Session Equipment Utilized During Treatment: Gait belt Activity Tolerance: Patient tolerated treatment well Patient left: in chair;with call bell/phone within reach;with chair alarm set;with nursing/sitter in room     Time: 5621-3086 PT Time Calculation (  min): 41 min  Charges:  $Gait Training: 8-22 mins $Therapeutic Activity: 23-37 mins                    G Codes:      Natasha Fletcher,Natasha Fletcher 2013/11/13, 12:21 PM Carmelia Bake, PT, DPT November 13, 2013 Pager: [phone]

## 2013-11-08 NOTE — Progress Notes (Signed)
Patient is set to discharge to [facility] today. Patient & brother, Natasha Fletcher aware. Discharge packet given to RN, Tess. [facility] called for transport.   Clinical Social Work Department CLINICAL SOCIAL WORK PLACEMENT NOTE 11/08/2013  Patient:  Natasha Fletcher, Natasha Fletcher  Account Number:  [id number] Admit date:  11/05/2013  Clinical Social Worker:  Ulyess Blossom  Date/time:  11/06/2013 11:00 AM  Clinical Social Work is seeking post-discharge placement for this patient at the following level of care:   [employer]   (*CSW will update this form in Epic as items are completed)   11/06/2013  Patient/family provided with [place] Department of Clinical Social Work's list of facilities offering this level of care within the geographic area requested by the patient (or if unable, by the patient's family).  11/06/2013  Patient/family informed of their freedom to choose among providers that offer the needed level of care, that participate in Medicare, Medicaid or managed care program needed by the patient, have an available bed and are willing to accept the patient.  11/06/2013  Patient/family informed of [facility]' ownership interest in [facility], as well as of the fact that they are under no obligation to receive care at this facility.  PASARR submitted to EDS on 11/06/2013 PASARR number received on 11/06/2013  FL2 transmitted to all facilities in geographic area requested by pt/family on  11/06/2013 FL2 transmitted to all facilities within larger geographic area on   Patient informed that his/her managed care company has contracts with or will negotiate with  certain facilities, including the following:     Patient/family informed of bed offers received:  11/07/2013 Patient chooses bed at [place] Physician recommends and patient chooses bed at    Patient to be transferred to [place] on  11/08/2013 Patient to be transferred to facility by [facility] Patient and family  notified of transfer on 11/08/2013 Name of family member notified:  patient & brother, Natasha Fletcher via phone  The following physician request were entered in Epic:   Additional Comments:   Raynaldo Opitz, [place] Social Worker cell #: [phone]

## 2013-11-10 ENCOUNTER — Inpatient Hospital Stay (HOSPITAL_COMMUNITY)
Admission: EM | Admit: 2013-11-10 | Discharge: 2013-11-17 | DRG: 308 | Disposition: A | Discharge status: Skilled Nursing Facility | Payer: Medicare Other | Attending: Internal Medicine | Admitting: Internal Medicine

## 2013-11-10 ENCOUNTER — Emergency Department (HOSPITAL_COMMUNITY): Payer: Medicare Other

## 2013-11-10 ENCOUNTER — Encounter (HOSPITAL_COMMUNITY): Payer: Self-pay | Admitting: Emergency Medicine

## 2013-11-10 DIAGNOSIS — R1115 Cyclical vomiting syndrome unrelated to migraine: Secondary | ICD-10-CM | POA: Diagnosis not present

## 2013-11-10 DIAGNOSIS — D62 Acute posthemorrhagic anemia: Secondary | ICD-10-CM | POA: Diagnosis present

## 2013-11-10 DIAGNOSIS — Z66 Do not resuscitate: Secondary | ICD-10-CM | POA: Diagnosis present

## 2013-11-10 DIAGNOSIS — I5031 Acute diastolic (congestive) heart failure: Secondary | ICD-10-CM

## 2013-11-10 DIAGNOSIS — Z8249 Family history of ischemic heart disease and other diseases of the circulatory system: Secondary | ICD-10-CM | POA: Diagnosis not present

## 2013-11-10 DIAGNOSIS — R4789 Other speech disturbances: Secondary | ICD-10-CM

## 2013-11-10 DIAGNOSIS — D72829 Elevated white blood cell count, unspecified: Secondary | ICD-10-CM | POA: Diagnosis present

## 2013-11-10 DIAGNOSIS — K219 Gastro-esophageal reflux disease without esophagitis: Secondary | ICD-10-CM | POA: Diagnosis present

## 2013-11-10 DIAGNOSIS — N390 Urinary tract infection, site not specified: Secondary | ICD-10-CM | POA: Diagnosis present

## 2013-11-10 DIAGNOSIS — E2749 Other adrenocortical insufficiency: Secondary | ICD-10-CM | POA: Diagnosis present

## 2013-11-10 DIAGNOSIS — E43 Unspecified severe protein-calorie malnutrition: Secondary | ICD-10-CM

## 2013-11-10 DIAGNOSIS — R5381 Other malaise: Secondary | ICD-10-CM | POA: Diagnosis present

## 2013-11-10 DIAGNOSIS — Z823 Family history of stroke: Secondary | ICD-10-CM

## 2013-11-10 DIAGNOSIS — R339 Retention of urine, unspecified: Secondary | ICD-10-CM

## 2013-11-10 DIAGNOSIS — M129 Arthropathy, unspecified: Secondary | ICD-10-CM | POA: Diagnosis present

## 2013-11-10 DIAGNOSIS — I4891 Unspecified atrial fibrillation: Secondary | ICD-10-CM | POA: Diagnosis present

## 2013-11-10 DIAGNOSIS — C50919 Malignant neoplasm of unspecified site of unspecified female breast: Secondary | ICD-10-CM | POA: Diagnosis present

## 2013-11-10 DIAGNOSIS — J45909 Unspecified asthma, uncomplicated: Secondary | ICD-10-CM | POA: Diagnosis present

## 2013-11-10 DIAGNOSIS — Z91011 Allergy to milk products: Secondary | ICD-10-CM | POA: Diagnosis not present

## 2013-11-10 DIAGNOSIS — Z87891 Personal history of nicotine dependence: Secondary | ICD-10-CM

## 2013-11-10 DIAGNOSIS — I4892 Unspecified atrial flutter: Secondary | ICD-10-CM | POA: Diagnosis present

## 2013-11-10 DIAGNOSIS — Z881 Allergy status to other antibiotic agents status: Secondary | ICD-10-CM

## 2013-11-10 DIAGNOSIS — Z96659 Presence of unspecified artificial knee joint: Secondary | ICD-10-CM

## 2013-11-10 DIAGNOSIS — A498 Other bacterial infections of unspecified site: Secondary | ICD-10-CM | POA: Diagnosis present

## 2013-11-10 DIAGNOSIS — E079 Disorder of thyroid, unspecified: Secondary | ICD-10-CM

## 2013-11-10 DIAGNOSIS — R209 Unspecified disturbances of skin sensation: Secondary | ICD-10-CM | POA: Diagnosis present

## 2013-11-10 DIAGNOSIS — E860 Dehydration: Secondary | ICD-10-CM

## 2013-11-10 DIAGNOSIS — Z96649 Presence of unspecified artificial hip joint: Secondary | ICD-10-CM | POA: Diagnosis not present

## 2013-11-10 DIAGNOSIS — N3000 Acute cystitis without hematuria: Secondary | ICD-10-CM

## 2013-11-10 DIAGNOSIS — IMO0002 Reserved for concepts with insufficient information to code with codable children: Secondary | ICD-10-CM

## 2013-11-10 DIAGNOSIS — R4781 Slurred speech: Secondary | ICD-10-CM

## 2013-11-10 DIAGNOSIS — R42 Dizziness and giddiness: Secondary | ICD-10-CM | POA: Diagnosis present

## 2013-11-10 DIAGNOSIS — K226 Gastro-esophageal laceration-hemorrhage syndrome: Secondary | ICD-10-CM | POA: Diagnosis present

## 2013-11-10 DIAGNOSIS — K920 Hematemesis: Secondary | ICD-10-CM

## 2013-11-10 DIAGNOSIS — H409 Unspecified glaucoma: Secondary | ICD-10-CM | POA: Diagnosis present

## 2013-11-10 DIAGNOSIS — I10 Essential (primary) hypertension: Secondary | ICD-10-CM | POA: Diagnosis present

## 2013-11-10 DIAGNOSIS — I480 Paroxysmal atrial fibrillation: Secondary | ICD-10-CM | POA: Diagnosis present

## 2013-11-10 DIAGNOSIS — D630 Anemia in neoplastic disease: Secondary | ICD-10-CM | POA: Diagnosis present

## 2013-11-10 DIAGNOSIS — I5033 Acute on chronic diastolic (congestive) heart failure: Secondary | ICD-10-CM | POA: Diagnosis present

## 2013-11-10 DIAGNOSIS — N179 Acute kidney failure, unspecified: Secondary | ICD-10-CM

## 2013-11-10 DIAGNOSIS — Z79899 Other long term (current) drug therapy: Secondary | ICD-10-CM | POA: Diagnosis not present

## 2013-11-10 DIAGNOSIS — I509 Heart failure, unspecified: Secondary | ICD-10-CM | POA: Diagnosis present

## 2013-11-10 DIAGNOSIS — C50119 Malignant neoplasm of central portion of unspecified female breast: Secondary | ICD-10-CM

## 2013-11-10 LAB — CBC WITH DIFFERENTIAL/PLATELET
Basophils Absolute: 0 10*3/uL (ref 0.0–0.1)
Basophils Relative: 0 % (ref 0–1)
EOS ABS: 0.2 10*3/uL (ref 0.0–0.7)
EOS PCT: 1 % (ref 0–5)
HCT: 30.9 % — ABNORMAL LOW (ref 36.0–46.0)
Hemoglobin: 9.5 g/dL — ABNORMAL LOW (ref 12.0–15.0)
LYMPHS ABS: 1.5 10*3/uL (ref 0.7–4.0)
Lymphocytes Relative: 10 % — ABNORMAL LOW (ref 12–46)
MCH: 27.8 pg (ref 26.0–34.0)
MCHC: 30.7 g/dL (ref 30.0–36.0)
MCV: 90.4 fL (ref 78.0–100.0)
MONOS PCT: 3 % (ref 3–12)
Monocytes Absolute: 0.5 10*3/uL (ref 0.1–1.0)
NEUTROS PCT: 86 % — AB (ref 43–77)
Neutro Abs: 13.1 10*3/uL — ABNORMAL HIGH (ref 1.7–7.7)
PLATELETS: 184 10*3/uL (ref 150–400)
RBC: 3.42 MIL/uL — AB (ref 3.87–5.11)
RDW: 16.3 % — ABNORMAL HIGH (ref 11.5–15.5)
WBC: 15.3 10*3/uL — ABNORMAL HIGH (ref 4.0–10.5)

## 2013-11-10 LAB — BASIC METABOLIC PANEL
Anion gap: 12 (ref 5–15)
BUN: 11 mg/dL (ref 6–23)
CO2: 22 mEq/L (ref 19–32)
Calcium: 9.1 mg/dL (ref 8.4–10.5)
Chloride: 106 mEq/L (ref 96–112)
Creatinine, Ser: 0.75 mg/dL (ref 0.50–1.10)
GFR calc Af Amer: 86 mL/min — ABNORMAL LOW (ref >90)
GFR, EST NON AFRICAN AMERICAN: 75 mL/min — AB (ref >90)
GLUCOSE: 110 mg/dL — AB (ref 70–99)
POTASSIUM: 4.5 meq/L (ref 3.7–5.3)
SODIUM: 140 meq/L (ref 137–147)

## 2013-11-10 LAB — URINE MICROSCOPIC-ADD ON

## 2013-11-10 LAB — URINALYSIS, ROUTINE W REFLEX MICROSCOPIC
BILIRUBIN URINE: NEGATIVE
GLUCOSE, UA: NEGATIVE mg/dL
Ketones, ur: NEGATIVE mg/dL
Nitrite: NEGATIVE
PH: 5.5 (ref 5.0–8.0)
Protein, ur: 30 mg/dL — AB
Specific Gravity, Urine: 1.021 (ref 1.005–1.030)
Urobilinogen, UA: 0.2 mg/dL (ref 0.0–1.0)

## 2013-11-10 LAB — TROPONIN I: Troponin I: <0.3 ng/mL (ref <0.30)

## 2013-11-10 MED ORDER — BISACODYL 5 MG PO TBEC
5.0000 mg | DELAYED_RELEASE_TABLET | Freq: Every day | ORAL | Status: DC | PRN
  Administered 2013-11-12 – 2013-11-16 (×3): 5 mg via ORAL
  Filled 2013-11-10 (×3): qty 1

## 2013-11-10 MED ORDER — SODIUM CHLORIDE 0.9 % IJ SOLN
3.0000 mL | Freq: Two times a day (BID) | INTRAMUSCULAR | Status: DC
  Administered 2013-11-10 – 2013-11-16 (×6): 3 mL via INTRAVENOUS

## 2013-11-10 MED ORDER — FUROSEMIDE 10 MG/ML IJ SOLN
40.0000 mg | Freq: Once | INTRAMUSCULAR | Status: AC
  Administered 2013-11-10: 40 mg via INTRAVENOUS
  Filled 2013-11-10: qty 4

## 2013-11-10 MED ORDER — DEXTROSE 5 % IV SOLN
1.0000 g | INTRAVENOUS | Status: DC
  Administered 2013-11-10: 1 g via INTRAVENOUS
  Filled 2013-11-10: qty 10

## 2013-11-10 MED ORDER — PANTOPRAZOLE SODIUM 40 MG PO TBEC
40.0000 mg | DELAYED_RELEASE_TABLET | Freq: Two times a day (BID) | ORAL | Status: DC
  Administered 2013-11-10 – 2013-11-17 (×14): 40 mg via ORAL
  Filled 2013-11-10 (×15): qty 1

## 2013-11-10 MED ORDER — ACETAMINOPHEN 325 MG PO TABS
650.0000 mg | ORAL_TABLET | Freq: Four times a day (QID) | ORAL | Status: DC | PRN
  Administered 2013-11-11 – 2013-11-14 (×2): 650 mg via ORAL
  Filled 2013-11-10 (×2): qty 2

## 2013-11-10 MED ORDER — DILTIAZEM LOAD VIA INFUSION
10.0000 mg | Freq: Once | INTRAVENOUS | Status: AC
  Administered 2013-11-10: 10 mg via INTRAVENOUS
  Filled 2013-11-10: qty 10

## 2013-11-10 MED ORDER — DILTIAZEM HCL 100 MG IV SOLR
5.0000 mg/h | INTRAVENOUS | Status: DC
  Administered 2013-11-10: 5 mg/h via INTRAVENOUS
  Filled 2013-11-10 (×2): qty 100

## 2013-11-10 MED ORDER — BOOST / RESOURCE BREEZE PO LIQD
1.0000 | Freq: Three times a day (TID) | ORAL | Status: DC
  Administered 2013-11-11 – 2013-11-17 (×13): 1 via ORAL

## 2013-11-10 MED ORDER — ANASTROZOLE 1 MG PO TABS
1.0000 mg | ORAL_TABLET | Freq: Every day | ORAL | Status: DC
  Administered 2013-11-11 – 2013-11-17 (×7): 1 mg via ORAL
  Filled 2013-11-10 (×8): qty 1

## 2013-11-10 MED ORDER — TRAMADOL HCL 50 MG PO TABS
50.0000 mg | ORAL_TABLET | Freq: Four times a day (QID) | ORAL | Status: DC | PRN
  Administered 2013-11-10 – 2013-11-12 (×2): 100 mg via ORAL
  Administered 2013-11-12: 50 mg via ORAL
  Administered 2013-11-12 (×2): 100 mg via ORAL
  Administered 2013-11-13: 50 mg via ORAL
  Administered 2013-11-14 – 2013-11-15 (×4): 100 mg via ORAL
  Filled 2013-11-10: qty 1
  Filled 2013-11-10: qty 2
  Filled 2013-11-10: qty 1
  Filled 2013-11-10 (×2): qty 2
  Filled 2013-11-10: qty 1
  Filled 2013-11-10 (×3): qty 2
  Filled 2013-11-10: qty 1
  Filled 2013-11-10: qty 2

## 2013-11-10 MED ORDER — LATANOPROST 0.005 % OP SOLN
1.0000 [drp] | Freq: Every day | OPHTHALMIC | Status: DC
  Administered 2013-11-10 – 2013-11-16 (×7): 1 [drp] via OPHTHALMIC
  Filled 2013-11-10: qty 2.5

## 2013-11-10 MED ORDER — PREDNISONE 5 MG PO TABS
5.0000 mg | ORAL_TABLET | Freq: Every day | ORAL | Status: DC
  Administered 2013-11-11 – 2013-11-17 (×7): 5 mg via ORAL
  Filled 2013-11-10 (×8): qty 1

## 2013-11-10 MED ORDER — SODIUM CHLORIDE 0.9 % IV BOLUS (SEPSIS)
1000.0000 mL | Freq: Once | INTRAVENOUS | Status: AC
  Administered 2013-11-10: 1000 mL via INTRAVENOUS

## 2013-11-10 MED ORDER — POLYETHYLENE GLYCOL 3350 17 G PO PACK
17.0000 g | PACK | Freq: Every day | ORAL | Status: DC | PRN
  Administered 2013-11-12 – 2013-11-15 (×3): 17 g via ORAL
  Filled 2013-11-10 (×2): qty 1

## 2013-11-10 MED ORDER — DILTIAZEM HCL 30 MG PO TABS
30.0000 mg | ORAL_TABLET | Freq: Four times a day (QID) | ORAL | Status: DC
  Administered 2013-11-10 – 2013-11-12 (×7): 30 mg via ORAL
  Filled 2013-11-10 (×12): qty 1

## 2013-11-10 MED ORDER — ONDANSETRON HCL 4 MG PO TABS: 4.0000 mg | ORAL_TABLET | Freq: Four times a day (QID) | ORAL | Status: DC | PRN

## 2013-11-10 NOTE — ED Notes (Signed)
Pt arrived to hospital with catheter that was placed at SNF yesterday.

## 2013-11-10 NOTE — ED Provider Notes (Signed)
CSN: [id number]     Arrival date & time 11/10/13  1718 History   First MD Initiated Contact with Patient 11/10/13 1730     Chief Complaint  Patient presents with  . Atrial Fibrillation     (Consider location/radiation/quality/duration/timing/severity/associated sxs/prior Treatment) HPI 80 year old female presents with weakness, fatigue, and dizziness over the last 3 or 4 days. She was recently discharged from the hospital due to confusion and renal failure. Patient states this started after being discharged. The nursing home knows that the patient had A. fib today which appears to be new according to the records. Patient states she's not felt ill otherwise, including no fevers, cough, or dysuria. She did however have an inability to urinate yesterday and a Foley catheter was placed at the facility. She denies any focal weakness. There's been no confusion since discharge. She denies any chest pain or shortness of breath. Denies feeling any palpitations.  Past Medical History  Diagnosis Date  . Hypertension   . Asthma   . Glaucoma   . Incontinence   . Wears dentures   . Wears glasses   . Thyroid disease     "something years ago"  . GERD (gastroesophageal reflux disease)   . Cancer     rt breast  . Edema leg   . Fast breathing     "because of pill"  . Pneumonia     h/o younger  . Arthritis     "all over"  . S/P total knee arthroplasty   . Glaucoma   . Swelling     bilateral feet/ legs, more left.   Past Surgical History  Procedure Laterality Date  . Total hip arthroplasty Bilateral   . Shoulder surgery Left   . Appendectomy    . Carpal tunnel release Bilateral   . Tonsillectomy    . Colonoscopy    . Abdominal hysterectomy    . Eye surgery      both cataracts  . Breast biopsy  04/27/2011    Procedure: BREAST BIOPSY WITH NEEDLE LOCALIZATION;  Surgeon: Edward Jolly, MD;  Location: [facility];  Service: General;  Laterality: Right;  right needle  localized breast lumpectomy   . Breast lumpectomy  04/27/11    right breast by [facility]  . Total knee arthroplasty Right 02/19/2013    Procedure: RIGHT TOTAL KNEE ARTHROPLASTY;  Surgeon: Gearlean Alf, MD;  Location: [facility];  Service: Orthopedics;  Laterality: Right;  . Total knee arthroplasty Left 10/15/2013    Procedure: LEFT TOTAL KNEE ARTHROPLASTY;  Surgeon: Gearlean Alf, MD;  Location: [facility];  Service: Orthopedics;  Laterality: Left;   Family History  Problem Relation Age of Onset  . Heart disease Mother   . Stroke Father    History  Substance Use Topics  . Smoking status: Former Smoker -- 0.50 packs/day for 30 years    Types: Cigarettes    Quit date: 04/21/1975  . Smokeless tobacco: Never Used  . Alcohol Use: No   OB History   Grav Para Term Preterm Abortions TAB SAB Ect Mult Living                 Review of Systems  Constitutional: Positive for fatigue. Negative for fever.  Respiratory: Negative for shortness of breath.   Cardiovascular: Negative for chest pain and palpitations.  Gastrointestinal: Negative for nausea, vomiting and abdominal pain.  Genitourinary: Positive for difficulty urinating. Negative for dysuria.  Neurological: Positive for dizziness and weakness.  All other  systems reviewed and are negative.     Allergies  Milk-related compounds and Sulfa antibiotics  Home Medications   Prior to Admission medications   Medication Sig Start Date End Date Taking? Authorizing Provider  amLODipine (NORVASC) 5 MG tablet Take 1 tablet (5 mg total) by mouth daily. 11/08/13  Yes Robbie Lis, MD  anastrozole (ARIMIDEX) 1 MG tablet Take 1 tablet (1 mg total) by mouth daily. 10/08/13  Yes Adrena E Johnson, PA-C  ciprofloxacin (CIPRO) 500 MG tablet Take 1 tablet (500 mg total) by mouth 2 (two) times daily. 11/08/13  Yes Robbie Lis, MD  latanoprost (XALATAN) 0.005 % ophthalmic solution Place 1 drop into both eyes at bedtime.   Yes Historical Provider, MD  oxyCODONE  (OXY IR/ROXICODONE) 5 MG immediate release tablet Take 1-2 tablets (5-10 mg total) by mouth every 4 (four) hours as needed for severe pain. 11/08/13  Yes Robbie Lis, MD  pantoprazole (PROTONIX) 40 MG tablet Take 1 tablet (40 mg total) by mouth 2 (two) times daily. 11/08/13  Yes Robbie Lis, MD  predniSONE (DELTASONE) 5 MG tablet Take 5 mg by mouth daily with breakfast.   Yes Historical Provider, MD  traMADol (ULTRAM) 50 MG tablet Take 1-2 tablets (50-100 mg total) by mouth every 6 (six) hours as needed (mild pain). 11/08/13  Yes Robbie Lis, MD  acetaminophen (TYLENOL) 325 MG tablet Take 2 tablets (650 mg total) by mouth every 6 (six) hours as needed for mild pain (or Fever >/= 101). 11/08/13   Robbie Lis, MD  ondansetron (ZOFRAN) 4 MG tablet Take 1 tablet (4 mg total) by mouth every 6 (six) hours as needed for nausea. 11/08/13   Robbie Lis, MD   BP 111/83  Pulse 148  Temp(Src) 98.1 F (36.7 C) (Oral)  Resp 20  SpO2 100% Physical Exam  Nursing note and vitals reviewed. Constitutional: She is oriented to person, place, and time. She appears well-developed and well-nourished. No distress.  HENT:  Head: Normocephalic and atraumatic.  Right Ear: External ear normal.  Left Ear: External ear normal.  Nose: Nose normal.  Eyes: Right eye exhibits no discharge. Left eye exhibits no discharge.  Cardiovascular: Normal rate and normal heart sounds.  An irregularly irregular rhythm present.  Pulmonary/Chest: Effort normal and breath sounds normal. She has no rales.  Abdominal: Soft. There is no tenderness.  Neurological: She is alert and oriented to person, place, and time.  Skin: Skin is warm and dry.    ED Course  Procedures (including critical care time) Labs Review Labs Reviewed  CBC WITH DIFFERENTIAL - Abnormal; Notable for the following:    WBC 15.3 (*)    RBC 3.42 (*)    Hemoglobin 9.5 (*)    HCT 30.9 (*)    RDW 16.3 (*)    Neutrophils Relative % 86 (*)    Neutro Abs 13.1 (*)     Lymphocytes Relative 10 (*)    All other components within normal limits  BASIC METABOLIC PANEL - Abnormal; Notable for the following:    Glucose, Bld 110 (*)    GFR calc non Af Amer 75 (*)    GFR calc Af Amer 86 (*)    All other components within normal limits  URINALYSIS, ROUTINE W REFLEX MICROSCOPIC - Abnormal; Notable for the following:    Color, Urine AMBER (*)    APPearance CLOUDY (*)    Hgb urine dipstick MODERATE (*)    Protein, ur 30 (*)  Leukocytes, UA SMALL (*)    All other components within normal limits  TROPONIN I  URINE MICROSCOPIC-ADD ON    Imaging Review Dg Chest Portable 1 View  11/10/2013   CLINICAL DATA:  ATRIAL FIBRILLATION  EXAM: PORTABLE CHEST - 1 VIEW  COMPARISON:  11/05/2013  FINDINGS: The heart is mildly enlarged. There is mild prominence of interstitial markings but no definite pulmonary edema. There are no focal consolidations or pleural effusions.  IMPRESSION: Mild cardiomegaly.   Electronically Signed   By: Shon Hale M.D.   On: 11/10/2013 17:55     EKG Interpretation   Date/Time:  Saturday November 10 2013 17:25:42 EDT Ventricular Rate:  145 PR Interval:    QRS Duration: 69 QT Interval:  290 QTC Calculation: 450 R Axis:   -12 Text Interpretation:  Atrial fibrillation with rapid V-rate Abnormal  R-wave progression, early transition Repolarization abnormality, prob rate  related afib is new Confirmed by Asia Favata  MD, Farrell Broerman ([id number]) on 11/10/2013  5:39:13 PM      CRITICAL CARE Performed by: Sherwood Gambler T   Total critical care time: 30 minutes  Critical care time was exclusive of separately billable procedures and treating other patients.  Critical care was necessary to treat or prevent imminent or life-threatening deterioration.  Critical care was time spent personally by me on the following activities: development of treatment plan with patient and/or surrogate as well as nursing, discussions with consultants, evaluation of patient's  response to treatment, examination of patient, obtaining history from patient or surrogate, ordering and performing treatments and interventions, ordering and review of laboratory studies, ordering and review of radiographic studies, pulse oximetry and re-evaluation of patient's condition.  MDM   Final diagnoses:  Atrial fibrillation with RVR    Patient with new-onset A. fib with RVR. Patient was just discharged with acute renal failure. Renal function is normal at this time. There is no obvious infectious cause. Has a nonspecifically elevated white blood cell count is not different from several days ago. No fevers. Heart rate between 1:30 and 150 prior to diltiazem bolus and infusion which is controlled her rate. Given her comorbidities and new A. fib I feel she should be admitted to the hospital. Hospitalist will admit to telemetry.    Ephraim Hamburger, MD 11/10/13 2027

## 2013-11-10 NOTE — H&P (Addendum)
[facility] History and Physical  Natasha Fletcher PXT:[id number] DOB: 11/13/1925 DOA: 11/10/2013  Referring physician:  Regenia Skeeter PCP:  Myriam Jacobson, MD   Chief Complaint:  lightheadedness  HPI:  The patient is a 80 y.o. year-old female with history of hypertension, asthma, glaucoma, urine incontinence, thyroid disease, RA, gastroesophageal reflux disease, recent left knee total arthroplasty, and recent admission for AKI, nausea/vomiting, confusion attributed to E. Coli UTI, discharged on 8/6, who presents with lightheadedness.  She states that she did not feel well when she was discharged from the hospital. She had some persistent confusion, slurred speech, and stuttering. She has had some mild increased left hand numbness. Yesterday, she developed urinary retention and had a Foley catheter placed. Today, she noticed that she was feeling lightheaded. She denied passing out, loss of consciousness, however her balance was poor. She was noted to be tachycardic and to have an irregular pulse. She had an EKG which showed A. fib with RVR. She was transported to the emergency department.  She denies chest pain, palpitations, shortness of breath.   In the ER, BP stable, afebrile, but HR 130-150s, a-fib with RVR.  Troponin negative.  ECG with repolarization abnl, but no clear ischemia.  WBC 15.3, hgb 9.5, potassium and creatinine wnl.  CXR with mild cardiomegaly and some interstitial markings but no consolidations or pulmonary edema.  She was started on diltiazem gtt and given a 1L NS bolus and her HR trended down to 90s.    Review of Systems:  General:  Denies fevers, chills, weight loss or gain HEENT:  Denies changes to hearing and vision, rhinorrhea, sinus congestion, sore throat CV:  Denies chest pain and palpitations, lower extremity edema.  PULM:  Denies SOB, wheezing, cough.   GI:  Denies nausea, vomiting, constipation, diarrhea.   GU:  Denies dysuria, frequency, urgency ENDO:  Denies  polyuria, polydipsia.   HEME:  Denies hematemesis, blood in stools, melena, abnormal bruising or bleeding.  LYMPH:  Denies lymphadenopathy.   MSK:  Chronic arthralgias, myalgias.  Persistent left knee pain since surgery. DERM:  Denies skin rash or ulcer.   NEURO:  Per history of present illness PSYCH:  Denies anxiety and depression.    Past Medical History  Diagnosis Date  . Hypertension   . Asthma   . Glaucoma   . Incontinence   . Wears dentures   . Wears glasses   . Thyroid disease     "something years ago"  . GERD (gastroesophageal reflux disease)   . Cancer     rt breast  . Edema leg   . Fast breathing     "because of pill"  . Pneumonia     h/o younger  . Arthritis     "all over"  . S/P total knee arthroplasty   . Glaucoma   . Swelling     bilateral feet/ legs, more left.   Past Surgical History  Procedure Laterality Date  . Total hip arthroplasty Bilateral   . Shoulder surgery Left   . Appendectomy    . Carpal tunnel release Bilateral   . Tonsillectomy    . Colonoscopy    . Abdominal hysterectomy    . Eye surgery      both cataracts  . Breast biopsy  04/27/2011    Procedure: BREAST BIOPSY WITH NEEDLE LOCALIZATION;  Surgeon: Edward Jolly, MD;  Location: [facility];  Service: General;  Laterality: Right;  right needle localized breast lumpectomy   . Breast  lumpectomy  04/27/11    right breast by [facility]  . Total knee arthroplasty Right 02/19/2013    Procedure: RIGHT TOTAL KNEE ARTHROPLASTY;  Surgeon: Gearlean Alf, MD;  Location: [facility];  Service: Orthopedics;  Laterality: Right;  . Total knee arthroplasty Left 10/15/2013    Procedure: LEFT TOTAL KNEE ARTHROPLASTY;  Surgeon: Gearlean Alf, MD;  Location: [facility];  Service: Orthopedics;  Laterality: Left;   Social History:  reports that she quit smoking about 38 years ago. Her smoking use included Cigarettes. She has a 15 pack-year smoking history. She has never used smokeless tobacco. She  reports that she does not drink alcohol or use illicit drugs. [place]  Allergies  Allergen Reactions  . Milk-Related Compounds Other (See Comments)    Upset stomach   . Sulfa Antibiotics Other (See Comments)    Reaction many years ago-unknown    Family History  Problem Relation Age of Onset  . Heart disease Mother   . Stroke Father      Prior to Admission medications   Medication Sig Start Date End Date Taking? Authorizing Provider  amLODipine (NORVASC) 5 MG tablet Take 1 tablet (5 mg total) by mouth daily. 11/08/13  Yes Robbie Lis, MD  anastrozole (ARIMIDEX) 1 MG tablet Take 1 tablet (1 mg total) by mouth daily. 10/08/13  Yes Adrena E Johnson, PA-C  ciprofloxacin (CIPRO) 500 MG tablet Take 1 tablet (500 mg total) by mouth 2 (two) times daily. 11/08/13  Yes Robbie Lis, MD  latanoprost (XALATAN) 0.005 % ophthalmic solution Place 1 drop into both eyes at bedtime.   Yes Historical Provider, MD  oxyCODONE (OXY IR/ROXICODONE) 5 MG immediate release tablet Take 1-2 tablets (5-10 mg total) by mouth every 4 (four) hours as needed for severe pain. 11/08/13  Yes Robbie Lis, MD  pantoprazole (PROTONIX) 40 MG tablet Take 1 tablet (40 mg total) by mouth 2 (two) times daily. 11/08/13  Yes Robbie Lis, MD  predniSONE (DELTASONE) 5 MG tablet Take 5 mg by mouth daily with breakfast.   Yes Historical Provider, MD  traMADol (ULTRAM) 50 MG tablet Take 1-2 tablets (50-100 mg total) by mouth every 6 (six) hours as needed (mild pain). 11/08/13  Yes Robbie Lis, MD  acetaminophen (TYLENOL) 325 MG tablet Take 2 tablets (650 mg total) by mouth every 6 (six) hours as needed for mild pain (or Fever >/= 101). 11/08/13   Robbie Lis, MD  ondansetron (ZOFRAN) 4 MG tablet Take 1 tablet (4 mg total) by mouth every 6 (six) hours as needed for nausea. 11/08/13   Robbie Lis, MD   Physical Exam: Filed Vitals:   11/10/13 1830 11/10/13 1845 11/10/13 1900 11/10/13 1915  BP: 122/69 126/60 122/64 136/66  Pulse:       Temp:      TempSrc:      Resp: 13 13 21 22   SpO2:         General:  BF, NAD  Eyes:  PERRL, anicteric, non-injected.  ENT:  Nares clear.  OP clear, non-erythematous without plaques or exudates.  MMM.  Neck:  Supple without TM or JVD.    Lymph:  No cervical, supraclavicular, or submandibular LAD.  Cardiovascular:  IRRR HR now in the high 90s, low 100s, normal S1, S2, without m/r/g.  2+ pulses, warm extremities  Respiratory:  Rales to the mid-back bilaterally, no wheezes or rhonchi, no increased WOB.  Abdomen:  NABS.  Soft, ND, diffusely TTP without  rebound or guarding    Skin:  No rashes or focal lesions.  Left knee incision c/d/i with a few steristrips still in place.  No erythema or induration.  Still swollen.    Musculoskeletal:  Normal bulk and tone.  No LE edema.  Psychiatric:  A & O x 4.  Appropriate affect.    Neurologic:  CN 3-12 intact.  5/5 strength.  Sensation decreased over left hand.  Stuttered and somewhat slurred speech.    Labs on Admission:  Basic Metabolic Panel:  Recent Labs Lab 11/05/13 1535 11/05/13 1559 11/06/13 0821 11/07/13 0400 11/08/13 0353 11/10/13 1739  NA 129* 126* 138 146 146 140  K 6.6* 6.4* 4.4 4.0 3.6* 4.5  CL 88* 95* 99 109 112 106  CO2 22  --  25 27 25 22   GLUCOSE 135* 131* 113* 98 95 110*  BUN 125* 129* 95* 56* 25* 11  CREATININE 6.62* 7.20* 2.42* 1.00 0.72 0.75  CALCIUM 9.4  --  9.1 8.4 8.2* 9.1  PHOS  --   --  4.0 2.5 1.8*  --    Liver Function Tests:  Recent Labs Lab 11/05/13 1535 11/06/13 0821 11/07/13 0400 11/08/13 0353  AST 21  --   --   --   ALT 12  --   --   --   ALKPHOS 103  --   --   --   BILITOT 1.1  --   --   --   PROT 7.0  --   --   --   ALBUMIN 3.2* 3.0* 2.6* 2.5*   No results found for this basename: LIPASE, AMYLASE,  in the last 168 hours No results found for this basename: AMMONIA,  in the last 168 hours CBC:  Recent Labs Lab 11/05/13 1535 11/05/13 1559 11/06/13 0342 11/10/13 1739  WBC  13.0*  --  13.2* 15.3*  NEUTROABS 11.6*  --   --  13.1*  HGB 10.2* 11.9* 9.4* 9.5*  HCT 31.2* 35.0* 28.2* 30.9*  MCV 85.7  --  84.4 90.4  PLT 209  --  213 184   Cardiac Enzymes:  Recent Labs Lab 11/05/13 1535 11/05/13 2150 11/06/13 0343 11/10/13 1739  TROPONINI <0.30 <0.30 <0.30 <0.30    BNP (last 3 results) No results found for this basename: PROBNP,  in the last 8760 hours CBG: No results found for this basename: GLUCAP,  in the last 168 hours  Radiological Exams on Admission: Dg Chest Portable 1 View  11/10/2013   CLINICAL DATA:  ATRIAL FIBRILLATION  EXAM: PORTABLE CHEST - 1 VIEW  COMPARISON:  11/05/2013  FINDINGS: The heart is mildly enlarged. There is mild prominence of interstitial markings but no definite pulmonary edema. There are no focal consolidations or pleural effusions.  IMPRESSION: Mild cardiomegaly.   Electronically Signed   By: Shon Hale M.D.   On: 11/10/2013 17:55    EKG: Independently reviewed. A-fib with RVR, ST segment depressions in lateral leads  Assessment/Plan Active Problems:   Atrial fibrillation with RVR  ---  New onset a-fib with RVR -  Recent UGIB due to probable Mallory-Weiss tear a few days ago so will hold an AC -  Start oral dilt 30mg  po q6h and will try to turn of dilt gtt shortly -  Cycle troponins -  ECHO was just completed a few days which demonstrated preserved EF -  Repeat ECHO -  TSH a few days ago was also wnl -  Consider cardiology consult for ischemia evaluation  in AM  Evidence of acute diastolic heart failure on exam and CXR, likely due to a-fib/diastolic dysfunction/loss of atrial kick, plus she was given IVF in ER -  Lasix 40mg  IV once -  Daily weights, strict I/O -  BNP  Slurred speech, left hand numbness -  MRI brain  -  Holding ASA due to recent GIB -  RN stroke swallow -  q4h neuro check x 24 hours -  Further eval and neuro consult if screening MRI positive  Escherichia coli urinary tract infection from  previous admission -  Continue ceftriaxone through 8/10, then stop  Urinary retention, stable, continue foley  Recent coffee-ground emesis, likely Mallory-Weiss tear. Continue Protonix.  Breast cancer, stable, continue anastrozole  Hypertension, at risk for hypotension on oral dilt.  Hold norvasc.    GERD, stable, continue PPI  Hypothyroidism, TSH wnl, continue levothyroxine   Possible adrenal insufficiency, on chronic prednisone 5mg  daily -  Continue current dose as BP stable  Severe protein calorie malnutrition, continue liberalized diet and ensure supplements  Leukocytosis, increased slightly from previous admission -  UA improved -  CXR without PNA  Acute blood loss anemia persistent since previous admission -  Trend hgb  Diet:  regular Access:  PIV IVF:  off Proph:  SCDs  Code Status: DNR Family Communication: patient alone Disposition Plan: Admit to telemetry  Time spent: 60 min Natasha Fletcher [facility] Pager [phone]  If 7PM-7AM, please contact night-coverage [email or web address] Password [facility] 11/10/2013, 7:52 PM

## 2013-11-10 NOTE — ED Notes (Signed)
Attempted to call report to nurse.

## 2013-11-10 NOTE — ED Notes (Signed)
Per EMS - pt comes from [facility] with c/o a-fib based on EKG done at facility.  Pt's pulse upon palpation was irregular and @ 123 bpm.  Pt c/o dizziness, weakness and tingling in hands.  Pt was discharged from [facility] within last 7-10 days after being treated for renal treatment.  Pt's BP was 130/70, HR 110- 135, 98% on RA and RR 18.

## 2013-11-10 NOTE — ED Notes (Signed)
Pt comes from [facility] after staff did an EKG and found her to be in atrial fibrillation.  Pt had been c/o weakness and "wooziness," to staff and that prompted the EKG.  Pt endorses weakness, dizziness and general malaise.  Pt's lung sounds are clear.  Bowel sounds present and abdomen is soft and non-tender to palpation.  Pt has good peripheral pulses in all 4 extremities and no peripheral edema is noted.  Pt is on cardiac monitor with HR in 130's.  Pt denies SOB or pain anywhere.

## 2013-11-10 NOTE — ED Notes (Signed)
Bed: BE01 Expected date:  Expected time:  [place] of arrival:  Comments: EMS- afib

## 2013-11-10 NOTE — Progress Notes (Signed)
RN notified the ED that the patient had an order for RN stroke swallow screen. The CN in the ED stated that she would try to send a RN to complete the RN stroke swallow screen.

## 2013-11-10 NOTE — Progress Notes (Signed)
Pt denies any hx of dysphagia. Pt has lung sounds CTA before and after stroke swallow screen. Pt able to sip water from a cup and a straw without difficulty. Pt able to swallow a bite of saltine cracker without difficulty. Pt passed stroke swallow screen without any difficulty. Pt has no acute distress following stroke swallow screening. C.Graham, RN

## 2013-11-11 DIAGNOSIS — I4892 Unspecified atrial flutter: Secondary | ICD-10-CM

## 2013-11-11 DIAGNOSIS — I5031 Acute diastolic (congestive) heart failure: Secondary | ICD-10-CM | POA: Diagnosis present

## 2013-11-11 DIAGNOSIS — I10 Essential (primary) hypertension: Secondary | ICD-10-CM

## 2013-11-11 DIAGNOSIS — K920 Hematemesis: Secondary | ICD-10-CM

## 2013-11-11 DIAGNOSIS — R339 Retention of urine, unspecified: Secondary | ICD-10-CM

## 2013-11-11 LAB — CBC
HEMATOCRIT: 27.6 % — AB (ref 36.0–46.0)
Hemoglobin: 8.8 g/dL — ABNORMAL LOW (ref 12.0–15.0)
MCH: 28.6 pg (ref 26.0–34.0)
MCHC: 31.9 g/dL (ref 30.0–36.0)
MCV: 89.6 fL (ref 78.0–100.0)
PLATELETS: 161 10*3/uL (ref 150–400)
RBC: 3.08 MIL/uL — AB (ref 3.87–5.11)
RDW: 16.5 % — AB (ref 11.5–15.5)
WBC: 12.5 10*3/uL — ABNORMAL HIGH (ref 4.0–10.5)

## 2013-11-11 LAB — BASIC METABOLIC PANEL
ANION GAP: 11 (ref 5–15)
BUN: 9 mg/dL (ref 6–23)
CHLORIDE: 104 meq/L (ref 96–112)
CO2: 25 mEq/L (ref 19–32)
Calcium: 8.7 mg/dL (ref 8.4–10.5)
Creatinine, Ser: 0.76 mg/dL (ref 0.50–1.10)
GFR calc Af Amer: 86 mL/min — ABNORMAL LOW (ref >90)
GFR calc non Af Amer: 74 mL/min — ABNORMAL LOW (ref >90)
Glucose, Bld: 100 mg/dL — ABNORMAL HIGH (ref 70–99)
POTASSIUM: 3.8 meq/L (ref 3.7–5.3)
Sodium: 140 mEq/L (ref 137–147)

## 2013-11-11 LAB — CULTURE, BLOOD (ROUTINE X 2): CULTURE: NO GROWTH

## 2013-11-11 LAB — PRO B NATRIURETIC PEPTIDE: PRO B NATRI PEPTIDE: 6059 pg/mL — AB (ref 0–450)

## 2013-11-11 LAB — TROPONIN I
Troponin I: <0.3 ng/mL (ref <0.30)
Troponin I: <0.3 ng/mL (ref <0.30)

## 2013-11-11 MED ORDER — APIXABAN 2.5 MG PO TABS
2.5000 mg | ORAL_TABLET | Freq: Two times a day (BID) | ORAL | Status: DC
  Administered 2013-11-11 – 2013-11-17 (×12): 2.5 mg via ORAL
  Filled 2013-11-11 (×13): qty 1

## 2013-11-11 MED ORDER — CIPROFLOXACIN HCL 500 MG PO TABS
500.0000 mg | ORAL_TABLET | Freq: Two times a day (BID) | ORAL | Status: DC
  Administered 2013-11-11 – 2013-11-13 (×5): 500 mg via ORAL
  Filled 2013-11-11 (×7): qty 1

## 2013-11-11 NOTE — Progress Notes (Addendum)
[facility] PROGRESS NOTE  Natasha Fletcher OAC:[id number] DOB: [birth year] DOA: 11/10/2013 PCP: Myriam Jacobson, MD  Assessment/Plan:   Atrial fibrillation with RVR:  Now rate controlled on Cardizem, but still on drip. Will transition to oral Cardizem. Patient was not started on antiplatelet or anticoagulation due to recent episode of coffee-ground emesis during last hospitalization. She did not get an EGD at that time and was placed on empiric protonix. Patient has no previous history of atrial fibrillation. Reports having had a stress test 15 years ago with [facility] cardiology, reportedly negative. She has had no chest pain. Echocardiogram on 11/06/13 showed preserved ejection fraction and no regional wall motion abnormalities. It showed grade 2 diastolic dysfunction. With severe left ventricular hypertrophy and mild right ventricular hypertrophy bringing up the possibility of infiltrative cardiomyopathy such as amyloidosis. TSH normal. chadsvasc score at least 5 for age, htn, chf. MRI brain pending. No objective evidence of acute stroke.  Will d/w cardiology whether inpt or outpt ischemia w/u or risk great enough to warrant trial of anticoagulation, now that UTI improving and no vomiting.  Active Problems: Acute diastolic HF. Likely from above. Minimal symptoms, but proBNP >6000    Hypertension    Thyroid disease: TSH normal    GERD (gastroesophageal reflux disease)    Coffee ground emesis last admission in the setting of intractable vomiting, discharging MD presumed MW tear and placed on PPI.  None further. I am inclined to start anticoagulation and watch in house, but await cardiology opinion first. Remains on PPI  LVH/RVH -> ? Infiltrative cardiomyopathy? Defer to workup to cardiology.    Protein-calorie malnutrition, severe   reported Slurred speech: none currently. MRI pending. Neuro exam nonfocal  Urinary retention, acute: foley replaced 2 days ago. Will keep in and voiding  trial in 1-2 weeks. Pt known to Dr. Dorina Hoyer  UTI being treated. E coli pansensitive. Will change back to po cipro  debility   Code Status:  DNR Family Communication:  At bedside Disposition Plan:  Back to SNF when stable  Consultants:  [place] paged  Procedures:     Antibiotics:  Rocephin 8/8-8/9  cipro 8/9  HPI/Subjective: Feels better. Many questions. Speech is not slurred. Left finger paresthesias chronic. Wondering when she will go back to skilled nursing facility and continue rehabilitation. No dizziness shortness of breath or chest pain. Did feel some slight palpitations yesterday. No history of similar.  Objective: Filed Vitals:   11/11/13 0640  BP: 112/65  Pulse: 91  Temp:   Resp: 18    Intake/Output Summary (Last 24 hours) at 11/11/13 1226 Last data filed at 11/11/13 1034  Gross per 24 hour  Intake    770 ml  Output   3250 ml  Net  -2480 ml   Filed Weights   11/10/13 2115 11/11/13 0441  Weight: 67.2 kg (148 lb 2.4 oz) 67.1 kg (147 lb 14.9 oz)   Tele: probably atrial fib rate 80s  Exam:   General:  Alert, oriented. comfortable  Cardiovascular: irreg irreg no MGR  Respiratory: rales at bases  Abdomen: s, nt, nd  Ext: no CCE  Basic Metabolic Panel:  Recent Labs Lab 11/05/13 1559 11/06/13 0821 11/07/13 0400 11/08/13 0353 11/10/13 1739 11/11/13 0605  NA 126* 138 146 146 140 140  K 6.4* 4.4 4.0 3.6* 4.5 3.8  CL 95* 99 109 112 106 104  CO2  --  25 27 25 22 25   GLUCOSE 131* 113* 98 95 110* 100*  BUN 129*  95* 56* 25* 11 9  CREATININE 7.20* 2.42* 1.00 0.72 0.75 0.76  CALCIUM  --  9.1 8.4 8.2* 9.1 8.7  PHOS  --  4.0 2.5 1.8*  --   --    Liver Function Tests:  Recent Labs Lab 11/05/13 1535 11/06/13 0821 11/07/13 0400 11/08/13 0353  AST 21  --   --   --   ALT 12  --   --   --   ALKPHOS 103  --   --   --   BILITOT 1.1  --   --   --   PROT 7.0  --   --   --   ALBUMIN 3.2* 3.0* 2.6* 2.5*   No results found for this  basename: LIPASE, AMYLASE,  in the last 168 hours No results found for this basename: AMMONIA,  in the last 168 hours CBC:  Recent Labs Lab 11/05/13 1535 11/05/13 1559 11/06/13 0342 11/10/13 1739 11/11/13 0605  WBC 13.0*  --  13.2* 15.3* 12.5*  NEUTROABS 11.6*  --   --  13.1*  --   HGB 10.2* 11.9* 9.4* 9.5* 8.8*  HCT 31.2* 35.0* 28.2* 30.9* 27.6*  MCV 85.7  --  84.4 90.4 89.6  PLT 209  --  213 184 161   Cardiac Enzymes:  Recent Labs Lab 11/06/13 0343 11/10/13 1739 11/10/13 2346 11/11/13 0600 11/11/13 1149  TROPONINI <0.30 <0.30 <0.30 <0.30 <0.30   BNP (last 3 results)  Recent Labs  11/10/13 2346  PROBNP 6059.0*   CBG: No results found for this basename: GLUCAP,  in the last 168 hours  Recent Results (from the past 240 hour(s))  URINE CULTURE     Status: None   Collection Time    11/05/13  3:37 PM      Result Value Ref Range Status   Specimen Description URINE, CATHETERIZED   Final   Special Requests Normal   Final   Culture  Setup Time     Final   Value: 11/05/2013 20:05     Performed at [facility]     Final   Value: >=100,000 COLONIES/ML     Performed at [facility]   Culture     Final   Value: ESCHERICHIA COLI     Performed at [facility]   Report Status 11/07/2013 FINAL   Final   Organism ID, Bacteria ESCHERICHIA COLI   Final  CULTURE, BLOOD (ROUTINE X 2)     Status: None   Collection Time    11/05/13  5:32 PM      Result Value Ref Range Status   Specimen Description BLOOD RIGHT ANTECUBITAL   Final   Special Requests BOTTLES DRAWN AEROBIC AND ANAEROBIC 5ML   Final   Culture  Setup Time     Final   Value: 11/05/2013 22:52     Performed at [facility]   Culture     Final   Value:        BLOOD CULTURE RECEIVED NO GROWTH TO DATE CULTURE WILL BE HELD FOR 5 DAYS BEFORE ISSUING A FINAL NEGATIVE REPORT     Performed at [facility]   Report Status PENDING   Incomplete  MRSA PCR SCREENING      Status: None   Collection Time    11/05/13  7:09 PM      Result Value Ref Range Status   MRSA by PCR NEGATIVE  NEGATIVE Final   Comment:  The GeneXpert MRSA Assay (FDA     approved for NASAL specimens     only), is one component of a     comprehensive MRSA colonization     surveillance program. It is not     intended to diagnose MRSA     infection nor to guide or     monitor treatment for     MRSA infections.  CULTURE, BLOOD (ROUTINE X 2)     Status: None   Collection Time    11/05/13  7:30 PM      Result Value Ref Range Status   Specimen Description BLOOD LEFT ARM   Final   Special Requests BOTTLES DRAWN AEROBIC ONLY 4CC   Final   Culture  Setup Time     Final   Value: 11/06/2013 01:17     Performed at [facility]   Culture     Final   Value:        BLOOD CULTURE RECEIVED NO GROWTH TO DATE CULTURE WILL BE HELD FOR 5 DAYS BEFORE ISSUING A FINAL NEGATIVE REPORT     Performed at [facility]   Report Status PENDING   Incomplete     Studies: Dg Chest Portable 1 View  11/10/2013   CLINICAL DATA:  ATRIAL FIBRILLATION  EXAM: PORTABLE CHEST - 1 VIEW  COMPARISON:  11/05/2013  FINDINGS: The heart is mildly enlarged. There is mild prominence of interstitial markings but no definite pulmonary edema. There are no focal consolidations or pleural effusions.  IMPRESSION: Mild cardiomegaly.   Electronically Signed   By: Shon Hale M.D.   On: 11/10/2013 17:55    Scheduled Meds: . anastrozole  1 mg Oral Daily  . cefTRIAXone (ROCEPHIN)  IV  1 g Intravenous Q24H  . diltiazem  30 mg Oral 4 times per day  . feeding supplement (RESOURCE BREEZE)  1 Container Oral TID BM  . latanoprost  1 drop Both Eyes QHS  . pantoprazole  40 mg Oral BID  . predniSONE  5 mg Oral Q breakfast  . sodium chloride  3 mL Intravenous Q12H   Continuous Infusions: . diltiazem (CARDIZEM) infusion 5 mg/hr (11/10/13 1755)    Time spent: 35 minutes  [facility] Pager [phone]. If 7PM-7AM, please contact night-coverage at [email or web address], password [facility] 11/11/2013, 12:26 PM  LOS: 1 day

## 2013-11-11 NOTE — Progress Notes (Signed)
ANTICOAGULATION CONSULT NOTE - Initial Consult  Pharmacy Consult for apixaban Indication: atrial fibrillation  Allergies  Allergen Reactions  . Milk-Related Compounds Other (See Comments)    Upset stomach   . Sulfa Antibiotics Other (See Comments)    Reaction many years ago-unknown    Patient Measurements: Height: 5\' 6"  (167.6 cm) Weight: 147 lb 14.9 oz (67.1 kg) IBW/kg (Calculated) : 59.3   Vital Signs: Temp: 97.6 F (36.4 C) (08/09 1449) Temp src: Oral (08/09 1449) BP: 112/47 mmHg (08/09 1449) Pulse Rate: 82 (08/09 1449)  Labs:  Recent Labs  11/10/13 1739 11/10/13 2346 11/11/13 0600 11/11/13 0605 11/11/13 1149  HGB 9.5*  --   --  8.8*  --   HCT 30.9*  --   --  27.6*  --   PLT 184  --   --  161  --   CREATININE 0.75  --   --  0.76  --   TROPONINI <0.30 <0.30 <0.30  --  <0.30    Estimated Creatinine Clearance: 47.3 ml/min (by C-G formula based on Cr of 0.76).   Medical History: Past Medical History  Diagnosis Date  . Hypertension   . Asthma   . Glaucoma   . Incontinence   . Wears dentures   . Wears glasses   . Thyroid disease     "something years ago"  . GERD (gastroesophageal reflux disease)   . Cancer     rt breast  . Edema leg   . Fast breathing     "because of pill"  . Pneumonia     h/o younger  . Arthritis     "all over"  . S/P total knee arthroplasty   . Glaucoma   . Swelling     bilateral feet/ legs, more left.    Assessment: 28 yof with new onset afib. CHADSVASc score of 4. Pharmacy consulted to dose eliquis (requesting lower dose d/t age, wt and recent coffee-ground emesis). PMH includes recent left knee arthroplastly on 7/15, recent admission for ARF (SCr 7.2) in setting of volume depletion and e coli UTI.  Goal of Therapy:  Appropriate anticoagulation for elderly pt with afib  Plan:  Start apixaban 2.5mg  BID Pharmacy to educate patient prior to discharge  Kizzie Furnish, PharmD Pager: [phone] 11/11/2013 5:13 PM

## 2013-11-11 NOTE — Consult Note (Addendum)
Primary Physician: Janeice Robinson Reason for Consultation: AFL, LVH on echo   HPI:  Natasha Fletcher is a 80 y.o. year-old female with history of hypertension, asthma, glaucoma, urine incontinence, thyroid disease, RA, gastroesophageal reflux disease and recent left knee total arthroplasty whom we are asked to consult on for new AF and LVH on echo.   She denies any h/o of known heart disease. In 7/15 underwent L TKA. Was d/c'd to SNF.  Was here earlier this week for ARF (Cr 7.2) in setting of volume depletion and E. Coli UTI. That hospitalization complicated by coffee-ground emesis thought to be 2.2 to Mallory-Weiss tear in setting of N/V. No EGD done. Symptoms resolved spontaneously.   Was admitted yesterday with lightheadedness. In ER, ECG showed A. Flutter with RVR. Started on IV diltiazem with good rate control and switched to oral diltiazem. HR now in 80s.  Echo:  EF 65-70% with severe LVH. Also mild RVH. Grade 2 DD  Denies HF or previous CVA. Though pro-BNP = 6059  Lives at home with her sister. Denies falling. No bleeding.    Review of Systems:     Cardiac Review of Systems: {Y] = yes [ ]  = no  Chest Pain [    ]  Resting SOB [   ] Exertional SOB  [ y ]  Orthopnea [  ]   Pedal Edema [   ]    Palpitations [  ] Syncope  [  ]   Presyncope [   ]  General Review of Systems: [Y] = yes [  ]=no Constitional: recent weight change [  ]; anorexia [  ]; fatigue Blue.Reese  ]; nausea [  ]; night sweats [  ]; fever [  ]; or chills [  ];                                                                      Eyes : blurred vision [  ]; diplopia [   ]; vision changes [  ];  Amaurosis fugax[  ]; Resp: cough [  ];  wheezing[  ];  hemoptysis[  ];  PND [  ];  GI:  gallstones[  ], vomiting[  ];  dysphagia[  ]; melena[  ];  hematochezia [  ]; heartburn[  ];   GU: kidney stones [  ]; hematuria[  ];   dysuria [  ];  nocturia[  ]; incontinence [  ];             Skin: rash, swelling[  ];, hair loss[  ];  peripheral  edema[  ];  or itching[  ]; Musculosketetal: myalgias[  ];  joint swelling[  ];  joint erythema[y  ];  joint pain[ y ];  back pain[  ];  Heme/Lymph: bruising[  ];  bleeding[  ];  anemia[  ];  Neuro: TIA[  ];  headaches[  ];  stroke[  ];  vertigo[  ];  seizures[  ];   paresthesias[  ];  difficulty walking[  ];  Psych:depression[  ]; anxiety[  ];  Endocrine: diabetes[  ];  thyroid dysfunction[y  ];  Other:  Past Medical History  Diagnosis Date  . Hypertension   . Asthma   .  Glaucoma   . Incontinence   . Wears dentures   . Wears glasses   . Thyroid disease     "something years ago"  . GERD (gastroesophageal reflux disease)   . Cancer     rt breast  . Edema leg   . Fast breathing     "because of pill"  . Pneumonia     h/o younger  . Arthritis     "all over"  . S/P total knee arthroplasty   . Glaucoma   . Swelling     bilateral feet/ legs, more left.    Medications Prior to Admission  Medication Sig Dispense Refill  . amLODipine (NORVASC) 5 MG tablet Take 1 tablet (5 mg total) by mouth daily.  30 tablet  0  . anastrozole (ARIMIDEX) 1 MG tablet Take 1 tablet (1 mg total) by mouth daily.  30 tablet  5  . ciprofloxacin (CIPRO) 500 MG tablet Take 1 tablet (500 mg total) by mouth 2 (two) times daily.  8 tablet  0  . latanoprost (XALATAN) 0.005 % ophthalmic solution Place 1 drop into both eyes at bedtime.      Marland Kitchen oxyCODONE (OXY IR/ROXICODONE) 5 MG immediate release tablet Take 1-2 tablets (5-10 mg total) by mouth every 4 (four) hours as needed for severe pain.  30 tablet  0  . pantoprazole (PROTONIX) 40 MG tablet Take 1 tablet (40 mg total) by mouth 2 (two) times daily.  30 tablet  0  . predniSONE (DELTASONE) 5 MG tablet Take 5 mg by mouth daily with breakfast.      . traMADol (ULTRAM) 50 MG tablet Take 1-2 tablets (50-100 mg total) by mouth every 6 (six) hours as needed (mild pain).  60 tablet  1  . acetaminophen (TYLENOL) 325 MG tablet Take 2 tablets (650 mg total) by mouth every 6  (six) hours as needed for mild pain (or Fever >/= 101).  30 tablet  0  . ondansetron (ZOFRAN) 4 MG tablet Take 1 tablet (4 mg total) by mouth every 6 (six) hours as needed for nausea.  20 tablet  0     . anastrozole  1 mg Oral Daily  . ciprofloxacin  500 mg Oral BID  . diltiazem  30 mg Oral 4 times per day  . feeding supplement (RESOURCE BREEZE)  1 Container Oral TID BM  . latanoprost  1 drop Both Eyes QHS  . pantoprazole  40 mg Oral BID  . predniSONE  5 mg Oral Q breakfast  . sodium chloride  3 mL Intravenous Q12H    Infusions:    Allergies  Allergen Reactions  . Milk-Related Compounds Other (See Comments)    Upset stomach   . Sulfa Antibiotics Other (See Comments)    Reaction many years ago-unknown    History   Social History  . Marital Status: Single    Spouse Name: N/A    Number of Children: N/A  . Years of Education: N/A   Occupational History  . Not on file.   Social History Main Topics  . Smoking status: Former Smoker -- 0.50 packs/day for 30 years    Types: Cigarettes    Quit date: 04/21/1975  . Smokeless tobacco: Never Used  . Alcohol Use: Yes     Comment: occasionally  . Drug Use: No  . Sexual Activity: Not Currently   Other Topics Concern  . Not on file   Social History Narrative   From SNF, getting PT and using cane.  Family History  Problem Relation Age of Onset  . Heart disease Mother   . Stroke Father     PHYSICAL EXAM: Filed Vitals:   11/11/13 1449  BP: 112/47  Pulse: 82  Temp: 97.6 F (36.4 C)  Resp:      Intake/Output Summary (Last 24 hours) at 11/11/13 1647 Last data filed at 11/11/13 1034  Gross per 24 hour  Intake    770 ml  Output   3250 ml  Net  -2480 ml    General:  Elderly Well appearing. No respiratory difficulty HEENT: normal Neck: supple. no JVD. Carotids 2+ bilat; no bruits. No lymphadenopathy or thryomegaly appreciated. Cor: PMI nondisplaced. Iregular rate & rhythm. No rubs, gallops or  murmurs. Lungs: clear Abdomen: soft, nontender, nondistended. No hepatosplenomegaly. No bruits or masses. Good bowel sounds. Extremities: no cyanosis, clubbing, rash, S/p B TKA  L knee with some swelling and erythema (recent post-op_ Neuro: alert & oriented x 3, cranial nerves grossly intact. moves all 4 extremities w/o difficulty. Affect pleasant.  ECG: Aflutter 78. LVH with repol   Results for orders placed during the hospital encounter of 11/10/13 (from the past 24 hour(s))  CBC WITH DIFFERENTIAL     Status: Abnormal   Collection Time    11/10/13  5:39 PM      Result Value Ref Range   WBC 15.3 (*) 4.0 - 10.5 K/uL   RBC 3.42 (*) 3.87 - 5.11 MIL/uL   Hemoglobin 9.5 (*) 12.0 - 15.0 g/dL   HCT 30.9 (*) 36.0 - 46.0 %   MCV 90.4  78.0 - 100.0 fL   MCH 27.8  26.0 - 34.0 pg   MCHC 30.7  30.0 - 36.0 g/dL   RDW 16.3 (*) 11.5 - 15.5 %   Platelets 184  150 - 400 K/uL   Neutrophils Relative % 86 (*) 43 - 77 %   Neutro Abs 13.1 (*) 1.7 - 7.7 K/uL   Lymphocytes Relative 10 (*) 12 - 46 %   Lymphs Abs 1.5  0.7 - 4.0 K/uL   Monocytes Relative 3  3 - 12 %   Monocytes Absolute 0.5  0.1 - 1.0 K/uL   Eosinophils Relative 1  0 - 5 %   Eosinophils Absolute 0.2  0.0 - 0.7 K/uL   Basophils Relative 0  0 - 1 %   Basophils Absolute 0.0  0.0 - 0.1 K/uL  BASIC METABOLIC PANEL     Status: Abnormal   Collection Time    11/10/13  5:39 PM      Result Value Ref Range   Sodium 140  137 - 147 mEq/L   Potassium 4.5  3.7 - 5.3 mEq/L   Chloride 106  96 - 112 mEq/L   CO2 22  19 - 32 mEq/L   Glucose, Bld 110 (*) 70 - 99 mg/dL   BUN 11  6 - 23 mg/dL   Creatinine, Ser 0.75  0.50 - 1.10 mg/dL   Calcium 9.1  8.4 - 10.5 mg/dL   GFR calc non Af Amer 75 (*) >90 mL/min   GFR calc Af Amer 86 (*) >90 mL/min   Anion gap 12  5 - 15  TROPONIN I     Status: None   Collection Time    11/10/13  5:39 PM      Result Value Ref Range   Troponin I <0.30  <0.30 ng/mL  URINALYSIS, ROUTINE W REFLEX MICROSCOPIC     Status:  Abnormal   Collection  Time    11/10/13  5:53 PM      Result Value Ref Range   Color, Urine AMBER (*) YELLOW   APPearance CLOUDY (*) CLEAR   Specific Gravity, Urine 1.021  1.005 - 1.030   pH 5.5  5.0 - 8.0   Glucose, UA NEGATIVE  NEGATIVE mg/dL   Hgb urine dipstick MODERATE (*) NEGATIVE   Bilirubin Urine NEGATIVE  NEGATIVE   Ketones, ur NEGATIVE  NEGATIVE mg/dL   Protein, ur 30 (*) NEGATIVE mg/dL   Urobilinogen, UA 0.2  0.0 - 1.0 mg/dL   Nitrite NEGATIVE  NEGATIVE   Leukocytes, UA SMALL (*) NEGATIVE  URINE MICROSCOPIC-ADD ON     Status: None   Collection Time    11/10/13  5:53 PM      Result Value Ref Range   Squamous Epithelial / LPF RARE  RARE   WBC, UA 3-6  <3 WBC/hpf   RBC / HPF 0-2  <3 RBC/hpf   Urine-Other MUCOUS PRESENT    TROPONIN I     Status: None   Collection Time    11/10/13 11:46 PM      Result Value Ref Range   Troponin I <0.30  <0.30 ng/mL  PRO B NATRIURETIC PEPTIDE     Status: Abnormal   Collection Time    11/10/13 11:46 PM      Result Value Ref Range   Pro B Natriuretic peptide (BNP) 6059.0 (*) 0 - 450 pg/mL  TROPONIN I     Status: None   Collection Time    11/11/13  6:00 AM      Result Value Ref Range   Troponin I <0.30  <0.30 ng/mL  CBC     Status: Abnormal   Collection Time    11/11/13  6:05 AM      Result Value Ref Range   WBC 12.5 (*) 4.0 - 10.5 K/uL   RBC 3.08 (*) 3.87 - 5.11 MIL/uL   Hemoglobin 8.8 (*) 12.0 - 15.0 g/dL   HCT 27.6 (*) 36.0 - 46.0 %   MCV 89.6  78.0 - 100.0 fL   MCH 28.6  26.0 - 34.0 pg   MCHC 31.9  30.0 - 36.0 g/dL   RDW 16.5 (*) 11.5 - 15.5 %   Platelets 161  150 - 400 K/uL  BASIC METABOLIC PANEL     Status: Abnormal   Collection Time    11/11/13  6:05 AM      Result Value Ref Range   Sodium 140  137 - 147 mEq/L   Potassium 3.8  3.7 - 5.3 mEq/L   Chloride 104  96 - 112 mEq/L   CO2 25  19 - 32 mEq/L   Glucose, Bld 100 (*) 70 - 99 mg/dL   BUN 9  6 - 23 mg/dL   Creatinine, Ser 0.76  0.50 - 1.10 mg/dL   Calcium 8.7   8.4 - 10.5 mg/dL   GFR calc non Af Amer 74 (*) >90 mL/min   GFR calc Af Amer 86 (*) >90 mL/min   Anion gap 11  5 - 15  TROPONIN I     Status: None   Collection Time    11/11/13 11:49 AM      Result Value Ref Range   Troponin I <0.30  <0.30 ng/mL   Dg Chest Portable 1 View  11/10/2013   CLINICAL DATA:  ATRIAL FIBRILLATION  EXAM: PORTABLE CHEST - 1 VIEW  COMPARISON:  11/05/2013  FINDINGS: The heart  is mildly enlarged. There is mild prominence of interstitial markings but no definite pulmonary edema. There are no focal consolidations or pleural effusions.  IMPRESSION: Mild cardiomegaly.   Electronically Signed   By: Shon Hale M.D.   On: 11/10/2013 17:55     ASSESSMENT: 1. Atrial flutter, new onset     CHADS2 = 2 (HTN, age) [place] = 4 2. Severe LVH on echo  3. HTN 4. Recent L TKA 10/15/13 5. Recent episode of ARF  PLAN/DISCUSSION:  Natasha Fletcher has new onset AFL. She is now well rate controlled on oral diltiazem 30 q6 and is asymptomatic. Can likely switch over to Cardizem CD 120 daily in am. Her CHADSVASc score is 4 (or 5 if you include acute diastolic HF but aside from elevated BNP I don't see much evidence of this) so I would favor anticoagulation. Would favor Eliquis 2.5mg  bid (dose reduced due to age > 22 and wt ~ 60kg) with recent coffee-ground emesis. Echo notable for severe LVH but voltage normal on ECG so I doubt this is amyloidosis - more likely due to HTN. That said, will check SPEP/UPEP to further evaluate.   Will need CM consult to help her obtain Eliquis due to cost.   Benay Spice 5:04 PM

## 2013-11-12 ENCOUNTER — Inpatient Hospital Stay (HOSPITAL_COMMUNITY): Payer: Medicare Other

## 2013-11-12 DIAGNOSIS — I5031 Acute diastolic (congestive) heart failure: Secondary | ICD-10-CM

## 2013-11-12 LAB — CULTURE, BLOOD (ROUTINE X 2): Culture: NO GROWTH

## 2013-11-12 MED ORDER — FUROSEMIDE 10 MG/ML IJ SOLN
40.0000 mg | Freq: Once | INTRAMUSCULAR | Status: AC
  Administered 2013-11-12: 40 mg via INTRAVENOUS
  Filled 2013-11-12: qty 4

## 2013-11-12 MED ORDER — DILTIAZEM HCL ER COATED BEADS 180 MG PO CP24
180.0000 mg | ORAL_CAPSULE | Freq: Every day | ORAL | Status: DC
  Administered 2013-11-13 – 2013-11-17 (×5): 180 mg via ORAL
  Filled 2013-11-12 (×5): qty 1

## 2013-11-12 MED ORDER — DILTIAZEM HCL ER COATED BEADS 120 MG PO CP24
120.0000 mg | ORAL_CAPSULE | Freq: Every day | ORAL | Status: DC
  Administered 2013-11-12: 120 mg via ORAL
  Filled 2013-11-12: qty 1

## 2013-11-12 MED ORDER — METOPROLOL TARTRATE 1 MG/ML IV SOLN
5.0000 mg | Freq: Four times a day (QID) | INTRAVENOUS | Status: DC | PRN
  Filled 2013-11-12: qty 5

## 2013-11-12 MED ORDER — METOPROLOL TARTRATE 1 MG/ML IV SOLN
5.0000 mg | Freq: Once | INTRAVENOUS | Status: AC
  Administered 2013-11-12: 5 mg via INTRAVENOUS
  Filled 2013-11-12: qty 5

## 2013-11-12 MED ORDER — LORAZEPAM 0.5 MG PO TABS: 0.2500 mg | ORAL_TABLET | Freq: Once | ORAL | Status: DC

## 2013-11-12 NOTE — Progress Notes (Signed)
Assumed care of patient from prior RN.  I agree with previous assessment and will continue to monitor patient.  Owens Shark, Oluwanifemi Susman Cherie

## 2013-11-12 NOTE — Progress Notes (Signed)
PT Cancellation Note  Patient Details Name: Natasha Fletcher MRN: [id number] DOB: [birth year]   Cancelled Treatment:    Reason Eval/Treat Not Completed: Medical issues which prohibited therapy (HR has been in 150s, HR just now decr per RN, defer PT )   Kenyon Ana 11/12/2013, 3:03 PM

## 2013-11-12 NOTE — Discharge Instructions (Signed)
Information on my medicine - ELIQUIS® (apixaban) ° °This medication education was reviewed with me or my healthcare representative as part of my discharge preparation.  The pharmacist that spoke with me during my hospital stay was:  Stephanee Barcomb R, RPH ° °Why was Eliquis® prescribed for you? °Eliquis® was prescribed for you to reduce the risk of a blood clot forming that can cause a stroke if you have a medical condition called atrial fibrillation (a type of irregular heartbeat). ° °What do You need to know about Eliquis® ? °Take your Eliquis® TWICE DAILY - one tablet in the morning and one tablet in the evening with or without food. If you have difficulty swallowing the tablet whole please discuss with your pharmacist how to take the medication safely. ° °Take Eliquis® exactly as prescribed by your doctor and DO NOT stop taking Eliquis® without talking to the doctor who prescribed the medication.  Stopping may increase your risk of developing a stroke.  Refill your prescription before you run out. ° °After discharge, you should have regular check-up appointments with your healthcare provider that is prescribing your Eliquis®.  In the future your dose may need to be changed if your kidney function or weight changes by a significant amount or as you get older. ° °What do you do if you miss a dose? °If you miss a dose, take it as soon as you remember on the same day and resume taking twice daily.  Do not take more than one dose of ELIQUIS at the same time to make up a missed dose. ° °Important Safety Information °A possible side effect of Eliquis® is bleeding. You should call your healthcare provider right away if you experience any of the following: °Bleeding from an injury or your nose that does not stop. °Unusual colored urine (red or dark brown) or unusual colored stools (red or black). °Unusual bruising for unknown reasons. °A serious fall or if you hit your head (even if there is no bleeding). ° °Some  medicines may interact with Eliquis® and might increase your risk of bleeding or clotting while on Eliquis®. To help avoid this, consult your healthcare provider or pharmacist prior to using any new prescription or non-prescription medications, including herbals, vitamins, non-steroidal anti-inflammatory drugs (NSAIDs) and supplements. ° °This website has more information on Eliquis® (apixaban): http://www.eliquis.com/eliquis/home  °

## 2013-11-12 NOTE — Progress Notes (Signed)
[facility] PROGRESS NOTE  Natasha Fletcher IRW:[id number] DOB: 05/17/1925 DOA: 11/10/2013 PCP: Myriam Jacobson, MD  Assessment/Plan:   Atrial flutter with RVR:  Appreciate cardiology's assistance. Tolerating eliquis without signs of bleeding. HR now back up after transitioning to cardizem CD 120. Will give iv metoprolol and increase cardizem CD as BP tolerates.  Active Problems: Acute diastolic HF. Likely from above. Minimal symptoms, but proBNP >6000. Had some dyspnea in MRI scanner. Will give another dose of IV lasix.    Hypertension    Thyroid disease: TSH normal    GERD (gastroesophageal reflux disease)    Coffee ground emesis last admission in the setting of intractable vomiting, discharging MD presumed MW tear and placed on PPI.  None further. I am inclined to start anticoagulation and watch in house, but await cardiology opinion first. Remains on PPI  LVH/RVH ->  Per cardiology, likely HTN related, but SPEP, UPEP ordered.    Protein-calorie malnutrition, severe   reported Slurred speech: none currently. MRI shows no acute stroke  Urinary retention, acute: foley replaced 2 days ago. Will keep in and voiding trial in 1-2 weeks. Pt known to Dr. Dorina Hoyer  UTI being treated. E coli pansensitive.   Debility: continue PT/OT  Check labs in am  Code Status:  DNR Family Communication:  At bedside Disposition Plan:  Back to SNF when stable  Consultants:  [facility]  Procedures:     Antibiotics:  Rocephin 8/8-8/9  cipro 8/9  HPI/Subjective: Some dyspnea in MRI. Feels "like rushing", but no palpitations or CP per se.  Objective: Filed Vitals:   11/12/13 0612  BP: 122/63  Pulse: 93  Temp: 98.1 F (36.7 C)  Resp: 18    Intake/Output Summary (Last 24 hours) at 11/12/13 1221 Last data filed at 11/12/13 0900  Gross per 24 hour  Intake    240 ml  Output    750 ml  Net   -510 ml   Filed Weights   11/10/13 2115 11/11/13 0441 11/12/13 0612   Weight: 67.2 kg (148 lb 2.4 oz) 67.1 kg (147 lb 14.9 oz) 65.5 kg (144 lb 6.4 oz)   Tele: atrial fib/flutter rate 120-140  Exam:   General:  Alert, oriented. comfortable  Cardiovascular: irreg irreg no MGR  Respiratory: CTA without WRR  Abdomen: s, nt, nd  Ext: no CCE  Basic Metabolic Panel:  Recent Labs Lab 11/05/13 1559 11/06/13 0821 11/07/13 0400 11/08/13 0353 11/10/13 1739 11/11/13 0605  NA 126* 138 146 146 140 140  K 6.4* 4.4 4.0 3.6* 4.5 3.8  CL 95* 99 109 112 106 104  CO2  --  25 27 25 22 25   GLUCOSE 131* 113* 98 95 110* 100*  BUN 129* 95* 56* 25* 11 9  CREATININE 7.20* 2.42* 1.00 0.72 0.75 0.76  CALCIUM  --  9.1 8.4 8.2* 9.1 8.7  PHOS  --  4.0 2.5 1.8*  --   --    Liver Function Tests:  Recent Labs Lab 11/05/13 1535 11/06/13 0821 11/07/13 0400 11/08/13 0353  AST 21  --   --   --   ALT 12  --   --   --   ALKPHOS 103  --   --   --   BILITOT 1.1  --   --   --   PROT 7.0  --   --   --   ALBUMIN 3.2* 3.0* 2.6* 2.5*   No results found for this basename: LIPASE, AMYLASE,  in the last 168 hours No results found for this basename: AMMONIA,  in the last 168 hours CBC:  Recent Labs Lab 11/05/13 1535 11/05/13 1559 11/06/13 0342 11/10/13 1739 11/11/13 0605  WBC 13.0*  --  13.2* 15.3* 12.5*  NEUTROABS 11.6*  --   --  13.1*  --   HGB 10.2* 11.9* 9.4* 9.5* 8.8*  HCT 31.2* 35.0* 28.2* 30.9* 27.6*  MCV 85.7  --  84.4 90.4 89.6  PLT 209  --  213 184 161   Cardiac Enzymes:  Recent Labs Lab 11/06/13 0343 11/10/13 1739 11/10/13 2346 11/11/13 0600 11/11/13 1149  TROPONINI <0.30 <0.30 <0.30 <0.30 <0.30   BNP (last 3 results)  Recent Labs  11/10/13 2346  PROBNP 6059.0*   CBG: No results found for this basename: GLUCAP,  in the last 168 hours  Recent Results (from the past 240 hour(s))  URINE CULTURE     Status: None   Collection Time    11/05/13  3:37 PM      Result Value Ref Range Status   Specimen Description URINE, CATHETERIZED    Final   Special Requests Normal   Final   Culture  Setup Time     Final   Value: 11/05/2013 20:05     Performed at [place]     Final   Value: >=100,000 COLONIES/ML     Performed at [facility]   Culture     Final   Value: ESCHERICHIA COLI     Performed at [facility]   Report Status 11/07/2013 FINAL   Final   Organism ID, Bacteria ESCHERICHIA COLI   Final  CULTURE, BLOOD (ROUTINE X 2)     Status: None   Collection Time    11/05/13  5:32 PM      Result Value Ref Range Status   Specimen Description BLOOD RIGHT ANTECUBITAL   Final   Special Requests BOTTLES DRAWN AEROBIC AND ANAEROBIC 5ML   Final   Culture  Setup Time     Final   Value: 11/05/2013 22:52     Performed at [facility]   Culture     Final   Value: NO GROWTH 5 DAYS     Performed at [facility]   Report Status 11/11/2013 FINAL   Final  MRSA PCR SCREENING     Status: None   Collection Time    11/05/13  7:09 PM      Result Value Ref Range Status   MRSA by PCR NEGATIVE  NEGATIVE Final   Comment:            The GeneXpert MRSA Assay (FDA     approved for NASAL specimens     only), is one component of a     comprehensive MRSA colonization     surveillance program. It is not     intended to diagnose MRSA     infection nor to guide or     monitor treatment for     MRSA infections.  CULTURE, BLOOD (ROUTINE X 2)     Status: None   Collection Time    11/05/13  7:30 PM      Result Value Ref Range Status   Specimen Description BLOOD LEFT ARM   Final   Special Requests BOTTLES DRAWN AEROBIC ONLY 4CC   Final   Culture  Setup Time     Final   Value: 11/06/2013 01:17  Performed at [facility]     Final   Value: NO GROWTH 5 DAYS     Performed at [facility]   Report Status 11/12/2013 FINAL   Final     Studies: Mr Brain Wo Contrast  11/12/2013   CLINICAL DATA:  Transient slurred speech, dizziness, and left hand paresthesias.  Atrial fibrillation.  EXAM: MRI HEAD WITHOUT CONTRAST  TECHNIQUE: Multiplanar, multiecho pulse sequences of the brain and surrounding structures were obtained without intravenous contrast.  COMPARISON:  MRI brain 07/11/2013.  FINDINGS: No acute infarct, hemorrhage, or mass lesion is present. Extensive periventricular and subcortical confluent white matter changes are present bilaterally. No hemorrhage or mass lesion is present. White matter changes extend into the brainstem as before. A remote lacunar infarct is present within the right thalamus.  Flow is present in the major intracranial arteries. The patient is status post bilateral lens replacements. The globes and orbits are otherwise intact. The paranasal sinuses are clear. There is fluid in the mastoid air cells bilaterally, right greater than left. No obstructing nasopharyngeal lesion is evident.  IMPRESSION: 1. Stable appearance of diffuse white matter disease. This likely reflects the sequela of chronic microvascular ischemia. 2. No acute intracranial abnormality. 3. Persistent bilateral mastoid effusions, right greater than left.   Electronically Signed   By: Lawrence Santiago M.D.   On: 11/12/2013 11:04   Dg Chest Portable 1 View  11/10/2013   CLINICAL DATA:  ATRIAL FIBRILLATION  EXAM: PORTABLE CHEST - 1 VIEW  COMPARISON:  11/05/2013  FINDINGS: The heart is mildly enlarged. There is mild prominence of interstitial markings but no definite pulmonary edema. There are no focal consolidations or pleural effusions.  IMPRESSION: Mild cardiomegaly.   Electronically Signed   By: Shon Hale M.D.   On: 11/10/2013 17:55    Scheduled Meds: . anastrozole  1 mg Oral Daily  . apixaban  2.5 mg Oral BID  . ciprofloxacin  500 mg Oral BID  . [START ON 11/13/2013] diltiazem  180 mg Oral Daily  . feeding supplement (RESOURCE BREEZE)  1 Container Oral TID BM  . latanoprost  1 drop Both Eyes QHS  . metoprolol  5 mg Intravenous Once  . pantoprazole  40 mg Oral BID  .  predniSONE  5 mg Oral Q breakfast  . sodium chloride  3 mL Intravenous Q12H   Continuous Infusions:    Time spent: 35 minutes  [facility] Pager [phone]. If 7PM-7AM, please contact night-coverage at [email or web address], password [facility] 11/12/2013, 12:21 PM  LOS: 2 days

## 2013-11-12 NOTE — Progress Notes (Signed)
Clinical Social Work Department BRIEF PSYCHOSOCIAL ASSESSMENT 11/12/2013  Patient:  Natasha Fletcher, Natasha Fletcher     Account Number:  [id number]     Admit date:  11/10/2013  Clinical Social Worker:  Renold Genta  Date/Time:  11/12/2013 04:10 PM  Referred by:  Physician  Date Referred:  11/12/2013 Referred for  Other - See comment   Other Referral:   Admitted from: [facility]   Interview type:  Patient Other interview type:    PSYCHOSOCIAL DATA Living Status:  ALONE Admitted from facility:   Level of care:   Primary support name:  Davis Gourd (sister) ph#: [phone] Primary support relationship to patient:  SIBLING Degree of support available:   good    CURRENT CONCERNS Current Concerns  Post-Acute Placement   Other Concerns:    SOCIAL WORK ASSESSMENT / PLAN CSW received consult that patient was admitted from [facility].   Assessment/plan status:  Information/Referral to [employer] Other assessment/ plan:   Information/referral to community resources:   CSW completed FL2 and faxed information to [facility] - confirmed with [facility] that they would be able to take patient back pending insurance authorization.    PATIENT'S/FAMILY'S RESPONSE TO PLAN OF CARE: Patient recognized CSW from previous hospitalization (8/3 - 11/08/13). Patient is agreeable with plan to return to SNF if recommended by PT/MD. CSW will check back tomorrow.       Raynaldo Opitz, [facility] Clinical Social Worker cell #: [phone]

## 2013-11-12 NOTE — Progress Notes (Signed)
OT Cancellation Note  Patient Details Name: Natasha Fletcher MRN: [id number] DOB: 1925-06-12   Cancelled Treatment:    Reason Eval/Treat Not Completed: Medical issues which prohibited therapy  Pt with increased HR.  Betsy Pries 11/12/2013, 1:50 PM

## 2013-11-12 NOTE — Progress Notes (Signed)
Clinical Social Work Department CLINICAL SOCIAL WORK PLACEMENT NOTE 11/12/2013  Patient:  Natasha Fletcher, Natasha Fletcher  Account Number:  [id number] Admit date:  11/10/2013  Clinical Social Worker:  Renold Genta  Date/time:  11/12/2013 04:14 PM  Clinical Social Work is seeking post-discharge placement for this patient at the following level of care:   SKILLED NURSING   (*CSW will update this form in Epic as items are completed)   11/12/2013  Patient/family provided with [place] Department of Clinical Social Work's list of facilities offering this level of care within the geographic area requested by the patient (or if unable, by the patient's family).  11/12/2013  Patient/family informed of their freedom to choose among providers that offer the needed level of care, that participate in Medicare, Medicaid or managed care program needed by the patient, have an available bed and are willing to accept the patient.  11/12/2013  Patient/family informed of [facility]' ownership interest in [facility], as well as of the fact that they are under no obligation to receive care at this facility.  PASARR submitted to EDS on 11/12/2013 PASARR number received on 11/12/2013  FL2 transmitted to all facilities in geographic area requested by pt/family on  11/12/2013 FL2 transmitted to all facilities within larger geographic area on   Patient informed that his/her managed care company has contracts with or will negotiate with  certain facilities, including the following:     Patient/family informed of bed offers received:   Patient chooses bed at  Physician recommends and patient chooses bed at    Patient to be transferred to  on   Patient to be transferred to facility by  Patient and family notified of transfer on  Name of family member notified:    The following physician request were entered in Epic:   Additional Comments:   Raynaldo Opitz, [place] Social Worker cell #: [phone]

## 2013-11-12 NOTE — Care Management Note (Signed)
    Page 1 of 1   11/12/2013     2:46:47 PM CARE MANAGEMENT NOTE 11/12/2013  Patient:  Natasha Fletcher, Natasha Fletcher   Account Number:  [id number]  Date Initiated:  11/12/2013  Documentation initiated by:  [facility]  Subjective/Objective Assessment:   80 Y/O F ADMITTED W/A FIB.READMIT 8/3-8/6-ARF.     Action/Plan:   FROM SNF-[facility]   Anticipated DC Date:  11/16/2013   Anticipated DC Plan:  [place]  CM consult      Choice offered to / List presented to:             Status of service:  In process, will continue to follow Medicare Important Message given?   (If response is "NO", the following Medicare IM given date fields will be blank) Date Medicare IM given:   Medicare IM given by:   Date Additional Medicare IM given:   Additional Medicare IM given by:    Discharge Disposition:    Per UR Regulation:  Reviewed for med. necessity/level of care/duration of stay  If discussed at [place] of Stay Meetings, dates discussed:    Comments:  11/12/13 Natasha Agne RN,BSN NCM [phone] D/C PLAN RETURN SNF.

## 2013-11-13 LAB — PRO B NATRIURETIC PEPTIDE: PRO B NATRI PEPTIDE: 2654 pg/mL — AB (ref 0–450)

## 2013-11-13 LAB — BASIC METABOLIC PANEL
ANION GAP: 10 (ref 5–15)
BUN: 20 mg/dL (ref 6–23)
CHLORIDE: 102 meq/L (ref 96–112)
CO2: 27 mEq/L (ref 19–32)
Calcium: 9 mg/dL (ref 8.4–10.5)
Creatinine, Ser: 0.92 mg/dL (ref 0.50–1.10)
GFR, EST AFRICAN AMERICAN: 64 mL/min — AB (ref >90)
GFR, EST NON AFRICAN AMERICAN: 55 mL/min — AB (ref >90)
Glucose, Bld: 93 mg/dL (ref 70–99)
POTASSIUM: 4.7 meq/L (ref 3.7–5.3)
Sodium: 139 mEq/L (ref 137–147)

## 2013-11-13 LAB — UIFE/LIGHT CHAINS/TP QN, 24-HR UR
Albumin, U: DETECTED
Alpha 1, Urine: DETECTED — AB
Alpha 2, Urine: DETECTED — AB
Beta, Urine: DETECTED — AB
FREE KAPPA/LAMBDA RATIO: 36.18 ratio — AB (ref 2.04–10.37)
FREE LAMBDA LT CHAINS, UR: 0.68 mg/dL — AB (ref 0.02–0.67)
Free Kappa Lt Chains,Ur: 24.6 mg/dL — ABNORMAL HIGH (ref 0.14–2.42)
Gamma Globulin, Urine: DETECTED — AB
TOTAL PROTEIN, URINE-UPE24: 36.7 mg/dL

## 2013-11-13 LAB — CBC
HCT: 27.3 % — ABNORMAL LOW (ref 36.0–46.0)
HEMOGLOBIN: 8.6 g/dL — AB (ref 12.0–15.0)
MCH: 28.5 pg (ref 26.0–34.0)
MCHC: 31.5 g/dL (ref 30.0–36.0)
MCV: 90.4 fL (ref 78.0–100.0)
PLATELETS: 210 10*3/uL (ref 150–400)
RBC: 3.02 MIL/uL — ABNORMAL LOW (ref 3.87–5.11)
RDW: 16.8 % — ABNORMAL HIGH (ref 11.5–15.5)
WBC: 11 10*3/uL — AB (ref 4.0–10.5)

## 2013-11-13 MED ORDER — METOPROLOL TARTRATE 25 MG PO TABS
25.0000 mg | ORAL_TABLET | Freq: Two times a day (BID) | ORAL | Status: DC
  Administered 2013-11-13 – 2013-11-14 (×3): 25 mg via ORAL
  Filled 2013-11-13 (×6): qty 1

## 2013-11-13 NOTE — Evaluation (Signed)
Occupational Therapy Evaluation Patient Details Name: Natasha Fletcher MRN: [id number] DOB: [birth year] Today's Date: 11/13/2013    History of Present Illness 80 yo female admitted 11/05/13 with confusion, N/V , abdominal pain. Found to be, in acute renal failure, hyperkalemia,  uti. Pt s/p LTKA 10/15/13.    Clinical Impression   Pt presents to OT with decreased  I with ADL activity s/p admission to hospital with problems listed below. Pt will benefit from skilled OT to increase I with ADL activity and return to PLOF    Follow Up Recommendations  Supervision/Assistance - 24 hour;SNF          Precautions / Restrictions Precautions Precautions: Fall Restrictions Weight Bearing Restrictions: No      Mobility Bed Mobility Overal bed mobility: Needs Assistance Bed Mobility: Supine to Sit     Supine to sit: Min assist        Transfers Overall transfer level: Needs assistance   Transfers: Sit to/from Stand Sit to Stand: Mod assist Stand pivot transfers: Mod assist       General transfer comment: verbal cues for safe technique and hand placement         ADL Overall ADL's : Needs assistance/impaired Eating/Feeding: Independent;Bed level   Grooming: Wash/dry hands;Set up;Sitting   Upper Body Bathing: Supervision/ safety;Set up;Sitting   Lower Body Bathing: Sit to/from stand;Moderate assistance   Upper Body Dressing : Set up;Sitting   Lower Body Dressing: Moderate assistance;Sit to/from stand   Toilet Transfer: Stand-pivot;Cueing for safety;Moderate assistance   Toileting- Clothing Manipulation and Hygiene: Sit to/from stand;Moderate assistance         General ADL Comments: Pts HR 151 to chair.  upon sitting and resting HR 86- 105               Pertinent Vitals/Pain Pain Assessment: 0-10 Pain Score: 4         Extremity/Trunk Assessment Upper Extremity Assessment Upper Extremity Assessment: Generalized weakness           Communication  Communication Communication: No difficulties   Cognition      WFL                     General Comments   pt very motivated and frustrated by [place] expects to be discharged to:: Skilled nursing facility                                 Additional Comments: was DC'd from [facility] recently after tka       Prior Functioning/Environment Level of Independence: Needs assistance  Gait / Transfers Assistance Needed: sister and brother caregivers/     Comments: sister lives upstairs in duplex and and stay with her as needed.  other family nearby    OT Diagnosis: Generalized weakness   OT Problem List: Decreased strength;Decreased activity tolerance;Cardiopulmonary status limiting activity      OT Goals(Current goals can be found in the care plan section) Acute Rehab OT Goals Patient Stated Goal: back to rehab to complete my program OT Goal Formulation: With patient Time For Goal Achievement: 11/27/13 Potential to Achieve Goals: Good ADL Goals Pt Will Perform Grooming: with modified independence;standing Pt Will Transfer to Toilet: with modified independence;regular height toilet;ambulating Pt Will Perform Toileting - Clothing Manipulation and hygiene: with modified independence;sit to/from stand  OT Frequency: Min 2X/week  End of Session Equipment Utilized During Treatment: Surveyor, mining Communication: Mobility status  Activity Tolerance: Patient tolerated treatment well Patient left: in chair;with call bell/phone within reach;with chair alarm set   Time: 9924-2683 OT Time Calculation (min): 24 min Charges:  OT General Charges $OT Visit: 1 Procedure OT Evaluation $Initial OT Evaluation Tier I: 1 Procedure OT Treatments $Self Care/Home Management : 8-22 mins G-Codes:    Payton Mccallum D 78-08-16, 1:32 PM

## 2013-11-13 NOTE — Progress Notes (Signed)
Patient had a few episodes of heart rate that jumped to 150's this morning. However rate has mostly been controlled to remain <120. Patient without complaints at this time. Will continue to monitor. J.Lawren Sexson, RN

## 2013-11-13 NOTE — Progress Notes (Signed)
PT Cancellation Note  Patient Details Name: Natasha Fletcher MRN: [id number] DOB: 1925/11/15   Cancelled Treatment:    Reason Eval/Treat Not Completed: Medical issues which prohibited therapy (HR currently 160's. Noted started meds to assisst with rate control. Will check back this PM for  medical stability.) Pt also reports that she does not feel well.    [name] 11/13/2013, 10:46 AM [name] PT [phone]

## 2013-11-13 NOTE — Progress Notes (Signed)
Subjective:  Feels fatigued and "whoozy."  Objective:   Vital Signs in the last 24 hours: Temp:  [97.9 F (36.6 C)-98.1 F (36.7 C)] 98 F (36.7 C) (08/11 0608) Pulse Rate:  [86-154] 86 (08/11 0608) Resp:  [18] 18 (08/11 0608) BP: (97-128)/(54-64) 110/55 mmHg (08/11 0608) SpO2:  [100 %] 100 % (08/11 0608) Weight:  [145 lb 1 oz (65.8 kg)] 145 lb 1 oz (65.8 kg) (08/11 4268)  Intake/Output from previous day: 08/10 0701 - 08/11 0700 In: 240 [P.O.:240] Out: 675 [Urine:675]  Medications: . anastrozole  1 mg Oral Daily  . apixaban  2.5 mg Oral BID  . ciprofloxacin  500 mg Oral BID  . diltiazem  180 mg Oral Daily  . feeding supplement (RESOURCE BREEZE)  1 Container Oral TID BM  . latanoprost  1 drop Both Eyes QHS  . LORazepam  0.25 mg Oral Once  . pantoprazole  40 mg Oral BID  . predniSONE  5 mg Oral Q breakfast  . sodium chloride  3 mL Intravenous Q12H       Physical Exam:   General appearance: alert, cooperative and no distress Neck: no adenopathy, no carotid bruit, no JVD, supple, symmetrical, trachea midline and thyroid not enlarged, symmetric, no tenderness/mass/nodules Lungs: clear to auscultation bilaterally Heart: irregularly irregular rhythm and 1-2 sem Abdomen: soft, non-tender; bowel sounds normal; no masses,  no organomegaly Extremities: s/p L knee surgery with mild swelling Skin: Skin color, texture, turgor normal. No rashes or lesions Neurologic: Grossly normal   Rate: 125  Rhythm: atrial fibrillation  Lab Results:   Recent Labs  11/11/13 0605 11/13/13 0438  NA 140 139  K 3.8 4.7  CL 104 102  CO2 25 27  GLUCOSE 100* 93  BUN 9 20  CREATININE 0.76 0.92   CBC Latest Ref Rng 11/13/2013 11/11/2013 11/10/2013  WBC 4.0 - 10.5 K/uL 11.0(H) 12.5(H) 15.3(H)  Hemoglobin 12.0 - 15.0 g/dL 8.6(L) 8.8(L) 9.5(L)  Hematocrit 36.0 - 46.0 % 27.3(L) 27.6(L) 30.9(L)  Platelets 150 - 400 K/uL 210 161 184     Recent Labs  11/11/13 0600 11/11/13 1149    TROPONINI <0.30 <0.30    Hepatic Function Panel No results found for this basename: PROT, ALBUMIN, AST, ALT, ALKPHOS, BILITOT, BILIDIR, IBILI,  in the last 72 hours No results found for this basename: INR,  in the last 72 hours BNP (last 3 results)  Recent Labs  11/10/13 2346 11/13/13 0438  PROBNP 6059.0* 2654.0*    Lipid Panel  No results found for this basename: chol, trig, hdl, cholhdl, vldl, ldlcalc      Imaging:  Mr Brain Wo Contrast  11/12/2013   CLINICAL DATA:  Transient slurred speech, dizziness, and left hand paresthesias. Atrial fibrillation.  EXAM: MRI HEAD WITHOUT CONTRAST  TECHNIQUE: Multiplanar, multiecho pulse sequences of the brain and surrounding structures were obtained without intravenous contrast.  COMPARISON:  MRI brain 07/11/2013.  FINDINGS: No acute infarct, hemorrhage, or mass lesion is present. Extensive periventricular and subcortical confluent white matter changes are present bilaterally. No hemorrhage or mass lesion is present. White matter changes extend into the brainstem as before. A remote lacunar infarct is present within the right thalamus.  Flow is present in the major intracranial arteries. The patient is status post bilateral lens replacements. The globes and orbits are otherwise intact. The paranasal sinuses are clear. There is fluid in the mastoid air cells bilaterally, right greater than left. No obstructing nasopharyngeal lesion is evident.  IMPRESSION: 1. Stable  appearance of diffuse white matter disease. This likely reflects the sequela of chronic microvascular ischemia. 2. No acute intracranial abnormality. 3. Persistent bilateral mastoid effusions, right greater than left.   Electronically Signed   By: Lawrence Santiago M.D.   On: 11/12/2013 11:04    Echo Study Conclusions  - Left ventricle: The cavity size was normal. There was severe concentric hypertrophy. Systolic function was vigorous. The estimated ejection fraction was in the range of  65% to 70%. Wall motion was normal; there were no regional wall motion abnormalities. Features are consistent with a pseudonormal left ventricular filling pattern, with concomitant abnormal relaxation and increased filling pressure (grade 2 diastolic dysfunction). Doppler parameters are consistent with elevated ventricular end-diastolic filling pressure. - Aortic valve: Trileaflet; mildly thickened, mildly calcified leaflets. There was no regurgitation. - Aortic root: The aortic root was normal in size. - Mitral valve: Structurally normal valve. - Left atrium: The atrium was mildly dilated. - Right ventricle: The cavity size was normal. Wall thickness was mildly increased. Systolic function was normal. - Right atrium: The atrium was normal in size. - Pulmonary arteries: Systolic pressure was within the normal range.  Impressions:  - Severe left ventricular hypertrophy with intracavitary gradient 16 mmHg. At least mild right ventricular hypertrophy. Infiltrative cardiomyopathy such as amyloidosis should be considered.    Assessment/Plan:   Active Problems:   Hypertension   Thyroid disease   GERD (gastroesophageal reflux disease)   Coffee ground emesis   Protein-calorie malnutrition, severe   Atrial fibrillation with RVR   Slurred speech   Acute diastolic heart failure   Atrial flutter  1. AF with RVR rate 110 - 125 range presently; With significant LVH will add beta blocker to cardizem for improved rate control. On eliquis for NOAC anticoagulation. 2. Elevated BNP contributed by diastolic heart failure in setting of AFRVR 3. Anemia H/H 8.6/27.3 today 4. UTI 5. MRI with diffuse white matter disease without acute CVA   Troy Sine, MD, [facility] 11/13/2013, 8:19 AM

## 2013-11-13 NOTE — Progress Notes (Signed)
[facility] PROGRESS NOTE  Natasha Fletcher YJE:[id number] DOB: Dec 20, 1925 DOA: 11/10/2013 PCP: Myriam Jacobson, MD  Summary 80 y.o AA female with recent left TKA, recent admission for UTI, encephalopathy, N/V readmitted from SNF with new atrial fib/flutter with RVR.  Assessment/Plan:   Atrial flutter with RVR:  Appreciate cardiology's assistance. Tolerating eliquis without signs of bleeding. HR 120-140 this am, now 80s. On cardizem CD 180 and metoprolol 25 bid  Active Problems: Acute diastolic HF. Likely from above. Minimal symptoms, but proBNP >6000 initially. Down to 2000 today    Hypertension    Thyroid disease: TSH normal    GERD (gastroesophageal reflux disease)    Coffee ground emesis last admission in the setting of intractable vomiting, discharging MD presumed MW tear and placed on PPI.  None further. No bleeding noted on eliquis  LVH/RVH ->  Per cardiology, likely HTN related, but SPEP, UPEP ordered.    Protein-calorie malnutrition, severe   reported Slurred speech: none currently. MRI shows no acute stroke  Urinary retention, acute: foley replaced PTA. Will keep in and voiding trial in 1-2 weeks at SNF. Pt known to Dr. Dorina Hoyer  UTI being treated. E coli pansensitive. Day 8 abx. Will d/c cipro  Debility: continue PT/OT  Code Status:  DNR Family Communication:  At bedside Disposition Plan:  Back to SNF 1-2 days if HR remains stable  Consultants:  [facility]  Procedures:     Antibiotics:  Rocephin 8/8-8/9  cipro 8/9 - 8/11  HPI/Subjective: No new complaints  Objective: Filed Vitals:   11/13/13 1318  BP:   Pulse: 151  Temp:   Resp:     Intake/Output Summary (Last 24 hours) at 11/13/13 1346 Last data filed at 11/13/13 0800  Gross per 24 hour  Intake    120 ml  Output    675 ml  Net   -555 ml   Filed Weights   11/11/13 0441 11/12/13 0612 11/13/13 0608  Weight: 67.1 kg (147 lb 14.9 oz) 65.5 kg (144 lb 6.4 oz) 65.8 kg (145 lb  1 oz)   Tele: atrial fib/flutter rate 80s  Exam:   General:  Alert, oriented. Comfortable in chair  Cardiovascular: irreg irreg no MGR  Respiratory: CTA without WRR  Abdomen: s, nt, nd  Ext: no CCE  Basic Metabolic Panel:  Recent Labs Lab 11/07/13 0400 11/08/13 0353 11/10/13 1739 11/11/13 0605 11/13/13 0438  NA 146 146 140 140 139  K 4.0 3.6* 4.5 3.8 4.7  CL 109 112 106 104 102  CO2 27 25 22 25 27   GLUCOSE 98 95 110* 100* 93  BUN 56* 25* 11 9 20   CREATININE 1.00 0.72 0.75 0.76 0.92  CALCIUM 8.4 8.2* 9.1 8.7 9.0  PHOS 2.5 1.8*  --   --   --    Liver Function Tests:  Recent Labs Lab 11/07/13 0400 11/08/13 0353  ALBUMIN 2.6* 2.5*   No results found for this basename: LIPASE, AMYLASE,  in the last 168 hours No results found for this basename: AMMONIA,  in the last 168 hours CBC:  Recent Labs Lab 11/10/13 1739 11/11/13 0605 11/13/13 0438  WBC 15.3* 12.5* 11.0*  NEUTROABS 13.1*  --   --   HGB 9.5* 8.8* 8.6*  HCT 30.9* 27.6* 27.3*  MCV 90.4 89.6 90.4  PLT 184 161 210   Cardiac Enzymes:  Recent Labs Lab 11/10/13 1739 11/10/13 2346 11/11/13 0600 11/11/13 1149  TROPONINI <0.30 <0.30 <0.30 <0.30   BNP (last 3  results)  Recent Labs  11/10/13 2346 11/13/13 0438  PROBNP 6059.0* 2654.0*   CBG: No results found for this basename: GLUCAP,  in the last 168 hours  Recent Results (from the past 240 hour(s))  URINE CULTURE     Status: None   Collection Time    11/05/13  3:37 PM      Result Value Ref Range Status   Specimen Description URINE, CATHETERIZED   Final   Special Requests Normal   Final   Culture  Setup Time     Final   Value: 11/05/2013 20:05     Performed at [facility]     Final   Value: >=100,000 COLONIES/ML     Performed at [facility]   Culture     Final   Value: ESCHERICHIA COLI     Performed at [facility]   Report Status 11/07/2013 FINAL   Final   Organism ID, Bacteria ESCHERICHIA  COLI   Final  CULTURE, BLOOD (ROUTINE X 2)     Status: None   Collection Time    11/05/13  5:32 PM      Result Value Ref Range Status   Specimen Description BLOOD RIGHT ANTECUBITAL   Final   Special Requests BOTTLES DRAWN AEROBIC AND ANAEROBIC 5ML   Final   Culture  Setup Time     Final   Value: 11/05/2013 22:52     Performed at [facility]   Culture     Final   Value: NO GROWTH 5 DAYS     Performed at [facility]   Report Status 11/11/2013 FINAL   Final  MRSA PCR SCREENING     Status: None   Collection Time    11/05/13  7:09 PM      Result Value Ref Range Status   MRSA by PCR NEGATIVE  NEGATIVE Final   Comment:            The GeneXpert MRSA Assay (FDA     approved for NASAL specimens     only), is one component of a     comprehensive MRSA colonization     surveillance program. It is not     intended to diagnose MRSA     infection nor to guide or     monitor treatment for     MRSA infections.  CULTURE, BLOOD (ROUTINE X 2)     Status: None   Collection Time    11/05/13  7:30 PM      Result Value Ref Range Status   Specimen Description BLOOD LEFT ARM   Final   Special Requests BOTTLES DRAWN AEROBIC ONLY 4CC   Final   Culture  Setup Time     Final   Value: 11/06/2013 01:17     Performed at [facility]   Culture     Final   Value: NO GROWTH 5 DAYS     Performed at [facility]   Report Status 11/12/2013 FINAL   Final     Studies: Mr Brain Wo Contrast  11/12/2013   CLINICAL DATA:  Transient slurred speech, dizziness, and left hand paresthesias. Atrial fibrillation.  EXAM: MRI HEAD WITHOUT CONTRAST  TECHNIQUE: Multiplanar, multiecho pulse sequences of the brain and surrounding structures were obtained without intravenous contrast.  COMPARISON:  MRI brain 07/11/2013.  FINDINGS: No acute infarct, hemorrhage, or mass lesion is present. Extensive periventricular and subcortical confluent white matter changes are present  bilaterally. No hemorrhage  or mass lesion is present. White matter changes extend into the brainstem as before. A remote lacunar infarct is present within the right thalamus.  Flow is present in the major intracranial arteries. The patient is status post bilateral lens replacements. The globes and orbits are otherwise intact. The paranasal sinuses are clear. There is fluid in the mastoid air cells bilaterally, right greater than left. No obstructing nasopharyngeal lesion is evident.  IMPRESSION: 1. Stable appearance of diffuse white matter disease. This likely reflects the sequela of chronic microvascular ischemia. 2. No acute intracranial abnormality. 3. Persistent bilateral mastoid effusions, right greater than left.   Electronically Signed   By: Lawrence Santiago M.D.   On: 11/12/2013 11:04    Scheduled Meds: . anastrozole  1 mg Oral Daily  . apixaban  2.5 mg Oral BID  . ciprofloxacin  500 mg Oral BID  . diltiazem  180 mg Oral Daily  . feeding supplement (RESOURCE BREEZE)  1 Container Oral TID BM  . latanoprost  1 drop Both Eyes QHS  . LORazepam  0.25 mg Oral Once  . metoprolol tartrate  25 mg Oral BID  . pantoprazole  40 mg Oral BID  . predniSONE  5 mg Oral Q breakfast  . sodium chloride  3 mL Intravenous Q12H   Continuous Infusions:    Time spent: 35 minutes  [facility] Pager [phone]. If 7PM-7AM, please contact night-coverage at [email or web address], password [facility] 11/13/2013, 1:46 PM  LOS: 3 days

## 2013-11-14 DIAGNOSIS — N179 Acute kidney failure, unspecified: Secondary | ICD-10-CM

## 2013-11-14 DIAGNOSIS — E860 Dehydration: Secondary | ICD-10-CM

## 2013-11-14 LAB — BASIC METABOLIC PANEL
Anion gap: 13 (ref 5–15)
BUN: 18 mg/dL (ref 6–23)
CHLORIDE: 103 meq/L (ref 96–112)
CO2: 22 mEq/L (ref 19–32)
Calcium: 8.9 mg/dL (ref 8.4–10.5)
Creatinine, Ser: 0.77 mg/dL (ref 0.50–1.10)
GFR, EST AFRICAN AMERICAN: 86 mL/min — AB (ref >90)
GFR, EST NON AFRICAN AMERICAN: 74 mL/min — AB (ref >90)
Glucose, Bld: 85 mg/dL (ref 70–99)
Potassium: 4.2 mEq/L (ref 3.7–5.3)
SODIUM: 138 meq/L (ref 137–147)

## 2013-11-14 LAB — IGG, IGA, IGM
IGG (IMMUNOGLOBIN G), SERUM: 924 mg/dL (ref 690–1700)
IGM, SERUM: 30 mg/dL — AB (ref 52–322)
IgA: 132 mg/dL (ref 69–380)

## 2013-11-14 LAB — PROTEIN ELECTROPH W RFLX QUANT IMMUNOGLOBULINS
ALBUMIN ELP: 53.7 % — AB (ref 55.8–66.1)
ALPHA-1-GLOBULIN: 7.8 % — AB (ref 2.9–4.9)
Alpha-2-Globulin: 11.9 % — ABNORMAL HIGH (ref 7.1–11.8)
Beta 2: 5.4 % (ref 3.2–6.5)
Beta Globulin: 5.4 % (ref 4.7–7.2)
Gamma Globulin: 15.8 % (ref 11.1–18.8)
M-Spike, %: 0.2 g/dL
Total Protein ELP: 5 g/dL — ABNORMAL LOW (ref 6.0–8.3)

## 2013-11-14 LAB — CBC
HCT: 25.6 % — ABNORMAL LOW (ref 36.0–46.0)
Hemoglobin: 8.3 g/dL — ABNORMAL LOW (ref 12.0–15.0)
MCH: 28.7 pg (ref 26.0–34.0)
MCHC: 32.4 g/dL (ref 30.0–36.0)
MCV: 88.6 fL (ref 78.0–100.0)
Platelets: 245 10*3/uL (ref 150–400)
RBC: 2.89 MIL/uL — AB (ref 3.87–5.11)
RDW: 16.9 % — ABNORMAL HIGH (ref 11.5–15.5)
WBC: 11.7 10*3/uL — AB (ref 4.0–10.5)

## 2013-11-14 LAB — IMMUNOFIXATION ADD-ON

## 2013-11-14 NOTE — Evaluation (Addendum)
Physical Therapy Evaluation Patient Details Name: Natasha Fletcher MRN: [id number] DOB: 1925/08/27 Today's Date: 11/14/2013   History of Present Illness  80 yo female admitted 11/10/13 with weakness, ECG  in ED  noted A fib with RVR. pt was recenetly admitted 11/05/13 with confusion, N/V , abdominal pain. Found to be, in acute renal failure, hyperkalemia,  Uti.Returned to [facility]. Pt s/p LTKA 10/15/13.   Clinical Impression  Pt motivated to participate, ambulated x 25'  With HR into 130's. Pt also became very weak and SOB  As pt ambulated .  Pt will benefit from PT to address problems listed in note below.  Follow Up Recommendations SNF    Equipment Recommendations  None recommended by PT    Recommendations for Other Services       Precautions / Restrictions Precautions Precautions: Fall;Knee Precaution Comments: monitor HR      Mobility  Bed Mobility                  Transfers Overall transfer level: Needs assistance Equipment used: Rolling walker (2 wheeled) Transfers: Sit to/from Stand Sit to Stand: Min assist         General transfer comment: verbal cues for safe technique and hand placement  Ambulation/Gait Ambulation/Gait assistance: Min assist Ambulation Distance (Feet): 25 Feet Assistive device: Rolling walker (2 wheeled) Gait Pattern/deviations: Step-to pattern;Step-through pattern Gait velocity: decreased   General Gait Details: verbal cues for safe technique, pt fatigues quickly limiting distance, began increase in RR, dyspnea and fatigue  Stairs            Wheelchair Mobility    Modified Rankin (Stroke Patients Only)       Balance                                             Pertinent Vitals/Pain Pain Assessment: 0-10 Pain Score: 3  Pain Location: L knee Pain Descriptors / Indicators: Tender;Aching Pain Intervention(s): Premedicated before session;Ice applied  HR 87-134    Home Living Family/patient expects  to be discharged to:: Skilled nursing facility                      Prior Function Level of Independence: Needs assistance   Gait / Transfers Assistance Needed: sister and brother caregivers/     Comments: sister lives upstairs in duplex and and stay with her as needed.  other family nearby     Hand Dominance        Extremity/Trunk Assessment   Upper Extremity Assessment: Generalized weakness           Lower Extremity Assessment: LLE deficits/detail   LLE Deficits / Details: knee flxion 95*     Communication      Cognition Arousal/Alertness: Awake/alert Behavior During Therapy: WFL for tasks assessed/performed Overall Cognitive Status: Within Functional Limits for tasks assessed       Memory: Decreased short-term memory              General Comments      Exercises Total Joint Exercises Quad Sets: AROM;10 reps;Left Short Arc Quad: AROM;Left;10 reps;Supine Straight Leg Raises: AROM;Left;10 reps;Supine Long Arc Quad: AROM;Left;10 reps;Seated Knee Flexion: AAROM;Left;10 reps;Seated Goniometric ROM: 5-95      Assessment/Plan    PT Assessment Patient needs continued PT services  PT Diagnosis Difficulty walking;Acute pain;Generalized weakness   PT Problem List  Decreased strength;Decreased range of motion;Decreased activity tolerance;Decreased balance;Decreased mobility;Decreased knowledge of use of DME;Decreased knowledge of precautions  PT Treatment Interventions DME instruction;Gait training;Functional mobility training;Therapeutic exercise;Therapeutic activities;Patient/family education   PT Goals (Current goals can be found in the Care Plan section) Acute Rehab PT Goals Patient Stated Goal: back to rehab to complete my program PT Goal Formulation: With patient Time For Goal Achievement: 11/28/13 Potential to Achieve Goals: Good    Frequency Min 3X/week   Barriers to discharge        Co-evaluation               End of Session  Equipment Utilized During Treatment: Gait belt Activity Tolerance: Patient limited by fatigue Patient left: in chair;with call bell/phone within reach;with chair alarm set           Time: 5361-4431 PT Time Calculation (min): 39 min   Charges:   PT Evaluation $Initial PT Evaluation Tier I: 1 Procedure PT Treatments $Gait Training: 23-37 mins $Therapeutic Exercise: 8-22 mins   PT G Codes:          [name] 11/14/2013, 3:01 PM  [name] PT [phone]

## 2013-11-14 NOTE — Progress Notes (Signed)
ANTICOAGULATION CONSULT NOTE - Initial Consult  Pharmacy Consult for apixaban Indication: atrial fibrillation  Allergies  Allergen Reactions  . Milk-Related Compounds Other (See Comments)    Upset stomach   . Sulfa Antibiotics Other (See Comments)    Reaction many years ago-unknown    Patient Measurements: Height: 5\' 6"  (167.6 cm) Weight: 141 lb 12.1 oz (64.3 kg) IBW/kg (Calculated) : 59.3   Vital Signs: Temp: 97.6 F (36.4 C) (08/12 0549) Temp src: Oral (08/12 0549) BP: 113/65 mmHg (08/12 0549) Pulse Rate: 152 (08/12 0930)  Labs:  Recent Labs  11/13/13 0438 11/14/13 0422  HGB 8.6* 8.3*  HCT 27.3* 25.6*  PLT 210 245  CREATININE 0.92 0.77    Estimated Creatinine Clearance: 47.3 ml/min (by C-G formula based on Cr of 0.77).   Medical History: Past Medical History  Diagnosis Date  . Hypertension   . Asthma   . Glaucoma   . Incontinence   . Wears dentures   . Wears glasses   . Thyroid disease     "something years ago"  . GERD (gastroesophageal reflux disease)   . Cancer     rt breast  . Edema leg   . Fast breathing     "because of pill"  . Pneumonia     h/o younger  . Arthritis     "all over"  . S/P total knee arthroplasty   . Glaucoma   . Swelling     bilateral feet/ legs, more left.    Assessment: 69 yoF with new onset afib with RVR and CHADSVASc score of 4. Pharmacy consulted to dose eliquis (requesting lower dose d/t age, wt and recent coffee-ground emesis).  PMH includes recent left knee arthroplastly on 7/15, recent admission for ARF (SCr 7.2) in setting of volume depletion and e coli UTI.  8/12: Pt remains in Afib.  No bleeding noted.  Hgb continues to decline, today 8.3.  Plts WNL.    Goal of Therapy:  Appropriate anticoagulation for elderly pt with afib  Plan:  Continue apixaban 2.5mg  PO BID Pharmacy education on apixaban completed 8/10  Ralene Bathe, PharmD, BCPS 11/14/2013, 12:28 PM  Pager: [phone]

## 2013-11-14 NOTE — Progress Notes (Signed)
Assumed care of patient. No changes in initial am assessment. Cont with plan of care. Pt resting quietly in bed

## 2013-11-14 NOTE — Progress Notes (Signed)
Patient ID: Natasha Fletcher, female   DOB: [birth year], 80 y.o.   MRN: [id number] [facility] PROGRESS NOTE  Natasha Fletcher WCH:[id number] DOB: [birth year] DOA: 11/10/2013 PCP: Myriam Jacobson, MD  Brief narrative: 80 year old with recent left TKA, recent admission for UTI, encephalopathy who presented from SNF for new atrial fibrillation with RVR. Hospital course somewhat complicated with ongoing tachycardia. She was started on Eliquis. Cardiology is assisting the management.   Assessment/Plan:    Principal Problem: Atrial fibrillation with RVR  Started on Eliquis. No evidence of bleeding.  HR still 76- 152. Continue Cardizem and metoprolol.   Appreciate cardio input.  Active Problems: Acute on chronic diastolic CHF  Likely from atrial fibrillation. BNP trended down from 6000 --> 2000 range Hypertension  Continue Cardizem and metoprolol  GERD  Continue protonix H/O breast ca  Continue anastrozole  Protein-calorie malnutrition, severe   Encourage PO intake  Continue ensure supplements  Slurred speech  MRI no acute intracranial findings  Urinary retention, acute  Continue foley. Voiding trial in SNF in 1-2 weeks UTI, E.Coli  Was treated adequately DVT Prophylaxis   On Eliquis   Code Status: DNR/DNI  Family Communication:  plan of care discussed with the patient Disposition Plan: to SNF in next 24 hours   IV Access:   Peripheral IV  Procedures and diagnostic studies:    Mr Brain Wo Contrast Dec 09, 2013  1. Stable appearance of diffuse white matter disease. This likely reflects the sequela of chronic microvascular ischemia. 2. No acute intracranial abnormality. 3. Persistent bilateral mastoid effusions, right greater than left.    Medical Consultants:   Cardiology  Other Consultants:   Physical therapy   Anti-Infectives:   Rocephin 11/10/2013 --> 11/11/2013  Cipro 11/11/2013 --> 11/13/2013    Leisa Lenz, MD  [facility] Pager  [phone]  If 7PM-7AM, please contact night-coverage [email or web address] Password [facility] 11/14/2013, 8:17 AM   LOS: 4 days   HPI/Subjective: No acute overnight events.  Objective: Filed Vitals:   11/13/13 1318 11/13/13 1455 11/13/13 2153 11/14/13 0549  BP:  99/55 128/68 113/65  Pulse: 151 84 102 76  Temp:  98.1 F (36.7 C) 98.3 F (36.8 C) 97.6 F (36.4 C)  TempSrc:  Oral Oral Oral  Resp:  18 20 20   Height:      Weight:    64.3 kg (141 lb 12.1 oz)  SpO2:  100% 100% 100%    Intake/Output Summary (Last 24 hours) at 11/14/13 0817 Last data filed at 11/14/13 0553  Gross per 24 hour  Intake    120 ml  Output    700 ml  Net   -580 ml    Exam:   General:  Pt is alert, follows commands appropriately, not in acute distress  Cardiovascular: irregular rhythm, tachycardic, S1/S2 appreciated, soft SEM appreciated    Respiratory: Clear to auscultation bilaterally, no wheezing, no crackles, no rhonchi  Abdomen: Soft, non tender, non distended, bowel sounds present  Extremities: No edema, pulses DP and PT palpable bilaterally  Neuro: Grossly nonfocal  Data Reviewed: Basic Metabolic Panel:  Recent Labs Lab 11/08/13 0353 11/10/13 1739 11/11/13 0605 11/13/13 0438 11/14/13 0422  NA 146 140 140 139 138  K 3.6* 4.5 3.8 4.7 4.2  CL 112 106 104 102 103  CO2 25 22 25 27 22   GLUCOSE 95 110* 100* 93 85  BUN 25* 11 9 20 18   CREATININE 0.72 0.75 0.76 0.92 0.77  CALCIUM 8.2* 9.1 8.7 9.0 8.9  PHOS  1.8*  --   --   --   --    Liver Function Tests:  Recent Labs Lab 11/08/13 0353  ALBUMIN 2.5*   No results found for this basename: LIPASE, AMYLASE,  in the last 168 hours No results found for this basename: AMMONIA,  in the last 168 hours CBC:  Recent Labs Lab 11/10/13 1739 11/11/13 0605 11/13/13 0438 11/14/13 0422  WBC 15.3* 12.5* 11.0* 11.7*  NEUTROABS 13.1*  --   --   --   HGB 9.5* 8.8* 8.6* 8.3*  HCT 30.9* 27.6* 27.3* 25.6*  MCV 90.4 89.6 90.4 88.6  PLT 184 161 210  245   Cardiac Enzymes:  Recent Labs Lab 11/10/13 1739 11/10/13 2346 11/11/13 0600 11/11/13 1149  TROPONINI <0.30 <0.30 <0.30 <0.30   BNP: No components found with this basename: POCBNP,  CBG: No results found for this basename: GLUCAP,  in the last 168 hours  URINE CULTURE     Status: None   Collection Time    11/05/13  3:37 PM      Result Value Ref Range Status   Specimen Description URINE, CATHETERIZED   Final   Value: ESCHERICHIA COLI     Performed at [facility]   Report Status 11/07/2013 FINAL   Final   Organism ID, Bacteria ESCHERICHIA COLI   Final  CULTURE, BLOOD (ROUTINE X 2)     Status: None   Collection Time    11/05/13  5:32 PM      Result Value Ref Range Status   Specimen Description BLOOD RIGHT ANTECUBITAL   Final   Value: NO GROWTH 5 DAYS     Performed at [facility]   Report Status 11/11/2013 FINAL   Final  MRSA PCR SCREENING     Status: None   Collection Time    11/05/13  7:09 PM      Result Value Ref Range Status   MRSA by PCR NEGATIVE  NEGATIVE Final  CULTURE, BLOOD (ROUTINE X 2)     Status: None   Collection Time    11/05/13  7:30 PM      Result Value Ref Range Status   Specimen Description BLOOD LEFT ARM   Final   Value: NO GROWTH 5 DAYS     Performed at [facility]   Report Status 11/12/2013 FINAL   Final     Scheduled Meds: . anastrozole  1 mg Oral Daily  . apixaban  2.5 mg Oral BID  . diltiazem  180 mg Oral Daily  . feeding supplement   1 Container Oral TID BM  . LORazepam  0.25 mg Oral Once  . metoprolol tartrate  25 mg Oral BID  . pantoprazole  40 mg Oral BID  . predniSONE  5 mg Oral Q breakfast

## 2013-11-14 NOTE — Progress Notes (Signed)
[facility]: [facility] Subjective:  Feels better but still mildly "whoozy."  Objective:   Vital Signs in the last 24 hours: Temp:  [97.6 F (36.4 C)-98.3 F (36.8 C)] 97.6 F (36.4 C) (08/12 0549) Pulse Rate:  [76-151] 76 (08/12 0549) Resp:  [18-20] 20 (08/12 0549) BP: (99-128)/(55-68) 113/65 mmHg (08/12 0549) SpO2:  [100 %] 100 % (08/12 0549) Weight:  [141 lb 12.1 oz (64.3 kg)] 141 lb 12.1 oz (64.3 kg) (08/12 0549)  Intake/Output from previous day: 08/11 0701 - 08/12 0700 In: 240 [P.O.:240] Out: 700 [Urine:700]  I/O since admission: -4125  Medications: . anastrozole  1 mg Oral Daily  . apixaban  2.5 mg Oral BID  . diltiazem  180 mg Oral Daily  . feeding supplement (RESOURCE BREEZE)  1 Container Oral TID BM  . latanoprost  1 drop Both Eyes QHS  . LORazepam  0.25 mg Oral Once  . metoprolol tartrate  25 mg Oral BID  . pantoprazole  40 mg Oral BID  . predniSONE  5 mg Oral Q breakfast  . sodium chloride  3 mL Intravenous Q12H       Physical Exam:   General appearance: alert, cooperative and no distress Neck: no adenopathy, no carotid bruit, no JVD, supple, symmetrical, trachea midline and thyroid not enlarged, symmetric, no tenderness/mass/nodules Lungs: clear to auscultation bilaterally Heart: irregularly irregular rhythm and 1-2 sem Abdomen: soft, non-tender; bowel sounds normal; no masses,  no organomegaly Extremities: s/p L knee surgery with mild swelling Skin: Skin color, texture, turgor normal. No rashes or lesions Neurologic: Grossly normal   Rate: 80  Rhythm: atrial fibrillation  Lab Results:   Recent Labs  11/13/13 0438 11/14/13 0422  NA 139 138  K 4.7 4.2  CL 102 103  CO2 27 22  GLUCOSE 93 85  BUN 20 18  CREATININE 0.92 0.77   CBC Latest Ref Rng 11/14/2013 11/13/2013 11/11/2013  WBC 4.0 - 10.5 K/uL 11.7(H) 11.0(H) 12.5(H)  Hemoglobin 12.0 - 15.0 g/dL 8.3(L) 8.6(L) 8.8(L)  Hematocrit 36.0 - 46.0 % 25.6(L) 27.3(L) 27.6(L)  Platelets 150 - 400 K/uL  245 210 161     Recent Labs  11/11/13 1149  TROPONINI <0.30    Hepatic Function Panel No results found for this basename: PROT, ALBUMIN, AST, ALT, ALKPHOS, BILITOT, BILIDIR, IBILI,  in the last 72 hours No results found for this basename: INR,  in the last 72 hours BNP (last 3 results)  Recent Labs  11/10/13 2346 11/13/13 0438  PROBNP 6059.0* 2654.0*    Lipid Panel  No results found for this basename: chol,  trig,  hdl,  cholhdl,  vldl,  ldlcalc      Imaging:  Mr Brain Wo Contrast  11/12/2013   CLINICAL DATA:  Transient slurred speech, dizziness, and left hand paresthesias. Atrial fibrillation.  EXAM: MRI HEAD WITHOUT CONTRAST  TECHNIQUE: Multiplanar, multiecho pulse sequences of the brain and surrounding structures were obtained without intravenous contrast.  COMPARISON:  MRI brain 07/11/2013.  FINDINGS: No acute infarct, hemorrhage, or mass lesion is present. Extensive periventricular and subcortical confluent white matter changes are present bilaterally. No hemorrhage or mass lesion is present. White matter changes extend into the brainstem as before. A remote lacunar infarct is present within the right thalamus.  Flow is present in the major intracranial arteries. The patient is status post bilateral lens replacements. The globes and orbits are otherwise intact. The paranasal sinuses are clear. There is fluid in the mastoid air cells bilaterally, right greater than left.  No obstructing nasopharyngeal lesion is evident.  IMPRESSION: 1. Stable appearance of diffuse white matter disease. This likely reflects the sequela of chronic microvascular ischemia. 2. No acute intracranial abnormality. 3. Persistent bilateral mastoid effusions, right greater than left.   Electronically Signed   By: Lawrence Santiago M.D.   On: 11/12/2013 11:04    Echo Study Conclusions  - Left ventricle: The cavity size was normal. There was severe concentric hypertrophy. Systolic function was vigorous.  The estimated ejection fraction was in the range of 65% to 70%. Wall motion was normal; there were no regional wall motion abnormalities. Features are consistent with a pseudonormal left ventricular filling pattern, with concomitant abnormal relaxation and increased filling pressure (grade 2 diastolic dysfunction). Doppler parameters are consistent with elevated ventricular end-diastolic filling pressure. - Aortic valve: Trileaflet; mildly thickened, mildly calcified leaflets. There was no regurgitation. - Aortic root: The aortic root was normal in size. - Mitral valve: Structurally normal valve. - Left atrium: The atrium was mildly dilated. - Right ventricle: The cavity size was normal. Wall thickness was mildly increased. Systolic function was normal. - Right atrium: The atrium was normal in size. - Pulmonary arteries: Systolic pressure was within the normal range.  Impressions:  - Severe left ventricular hypertrophy with intracavitary gradient 16 mmHg. At least mild right ventricular hypertrophy. Infiltrative cardiomyopathy such as amyloidosis should be considered.    Assessment/Plan:   Active Problems:   Hypertension   Thyroid disease   GERD (gastroesophageal reflux disease)   Coffee ground emesis   Protein-calorie malnutrition, severe   Atrial fibrillation with RVR   Slurred speech   Acute diastolic heart failure   Atrial flutter  1. AF   Rate better today at 75 - 85 with the addition of beta blocker to cardizem. On eliquis for NOAC anticoagulation. 2. Elevated BNP contributed by diastolic heart failure in setting of AFRVR with severe LVH #. Severe LVH with 16 mm intracavitary gradient and hyperdynamic LV fxn;  Avoid dehydration.  ? Infiltrative  3. Anemia H/H 8.5/25.6 today, reduced from 8.8/27.6 on 8/9 4. UTI 5. MRI with diffuse white matter disease without acute CVA   Troy Sine, MD, [facility] 11/14/2013, 7:57 AM

## 2013-11-14 NOTE — Progress Notes (Signed)
Occupational Therapy Treatment Patient Details Name: Natasha Fletcher MRN: [id number] DOB: [birth year] Today's Date: 11/14/2013    History of present illness 80 yo female admitted 11/05/13 with confusion, N/V , abdominal pain. Found to be, in acute renal failure, hyperkalemia,  uti. Pt s/p LTKA 10/15/13. recently DC'd from snf rehab   OT comments  Pt so thankful to be up in chair.  HR still limiting.  HR 152 with bed to chair  Follow Up Recommendations  SNF          Precautions / Restrictions Precautions Precautions: Fall Precaution Comments: monitor HR Restrictions Weight Bearing Restrictions: No       Mobility Bed Mobility Overal bed mobility: Needs Assistance Bed Mobility: Supine to Sit Rolling: Min assist   Supine to sit: Min assist        Transfers   Equipment used: 1 person hand held assist Transfers: Sit to/from Stand Sit to Stand: Min assist Stand pivot transfers: Min assist       General transfer comment: verbal cues for safe technique and hand placement        ADL   Eating/Feeding: Independent;Sitting   Grooming: Wash/dry hands;Set up;Sitting   Upper Body Bathing: Supervision/ safety;Set up;Sitting   Lower Body Bathing: Sit to/from stand;Minimal assistance   Upper Body Dressing : Set up;Sitting   Lower Body Dressing: Minimal assistance;Sit to/from stand   Toilet Transfer: Stand-pivot;Cueing for Designer, television/film set and Hygiene: Sit to/from stand;Minimal assistance         General ADL Comments: HR 152 getting to chair                Cognition   Behavior During Therapy: [facility] for tasks assessed/performed Overall Cognitive Status: Within Functional Limits for tasks assessed                               General Comments  Pt very motivated!    Pertinent Vitals/ Pain       Pain Score: 3  Pain Location: L knee Pain Descriptors / Indicators: Sore         Frequency Min 2X/week     Progress Toward  Goals  OT Goals(current goals can now be found in the care plan section)     Acute Rehab OT Goals Patient Stated Goal: back to rehab to complete my program OT Goal Formulation: With patient Time For Goal Achievement: 11/27/13 Potential to Achieve Goals: Good  Plan Discharge plan remains appropriate       End of Session     Activity Tolerance Patient tolerated treatment well   Patient Left in chair;with call bell/phone within reach;with chair alarm set   Nurse Communication Mobility status        Time: 5916-3846 OT Time Calculation (min): 23 min  Charges: OT General Charges $OT Visit: 1 Procedure OT Treatments $Self Care/Home Management : 23-37 mins  Toa Mia, Thereasa Parkin 11/14/2013, 9:35 AM

## 2013-11-14 NOTE — Progress Notes (Signed)
CSW submitted clinical information for [facility] insurance authorization - anticipating possible discharge tomorrow.   Raynaldo Opitz, [facility] Clinical Social Worker cell #: [phone]

## 2013-11-15 ENCOUNTER — Inpatient Hospital Stay (HOSPITAL_COMMUNITY): Payer: Medicare Other

## 2013-11-15 MED ORDER — METOPROLOL TARTRATE 50 MG PO TABS
50.0000 mg | ORAL_TABLET | Freq: Two times a day (BID) | ORAL | Status: DC
  Administered 2013-11-15 – 2013-11-17 (×5): 50 mg via ORAL
  Filled 2013-11-15 (×6): qty 1

## 2013-11-15 MED ORDER — TRAMADOL HCL 50 MG PO TABS: 50.0000 mg | ORAL_TABLET | Freq: Four times a day (QID) | ORAL | Status: DC | PRN

## 2013-11-15 MED ORDER — OXYCODONE HCL 5 MG PO TABS: 5.0000 mg | ORAL_TABLET | ORAL | Status: DC | PRN

## 2013-11-15 MED ORDER — OXYCODONE-ACETAMINOPHEN 5-325 MG PO TABS
1.0000 | ORAL_TABLET | ORAL | Status: DC | PRN
  Administered 2013-11-15 (×3): 2 via ORAL
  Administered 2013-11-16 (×3): 1 via ORAL
  Administered 2013-11-17: 2 via ORAL
  Administered 2013-11-17 (×2): 1 via ORAL
  Filled 2013-11-15: qty 2
  Filled 2013-11-15 (×2): qty 1
  Filled 2013-11-15 (×3): qty 2
  Filled 2013-11-15 (×3): qty 1

## 2013-11-15 NOTE — Progress Notes (Signed)
Subjective:  Feels better but note r sided chest wall soreness  Objective:   Vital Signs in the last 24 hours: Temp:  [97.5 F (36.4 C)-99.3 F (37.4 C)] 98.8 F (37.1 C) (08/13 0411) Pulse Rate:  [83-152] 97 (08/13 0411) Resp:  [18-20] 20 (08/13 0411) BP: (91-121)/(44-59) 121/59 mmHg (08/13 0411) SpO2:  [99 %-100 %] 99 % (08/13 0411) Weight:  [141 lb 1.5 oz (64 kg)] 141 lb 1.5 oz (64 kg) (08/13 0411)  Intake/Output from previous day: -170 08/12 0701 - 08/13 0700 In: 480 [P.O.:480] Out: 650 [Urine:650]  I/O since admission: -4295  Medications: . anastrozole  1 mg Oral Daily  . apixaban  2.5 mg Oral BID  . diltiazem  180 mg Oral Daily  . feeding supplement (RESOURCE BREEZE)  1 Container Oral TID BM  . latanoprost  1 drop Both Eyes QHS  . LORazepam  0.25 mg Oral Once  . metoprolol tartrate  25 mg Oral BID  . pantoprazole  40 mg Oral BID  . predniSONE  5 mg Oral Q breakfast  . sodium chloride  3 mL Intravenous Q12H       Physical Exam:   General appearance: alert, cooperative and no distress Neck: no adenopathy, no carotid bruit, no JVD, supple, symmetrical, trachea midline and thyroid not enlarged, symmetric, no tenderness/mass/nodules Lungs: clear to auscultation bilaterally Heart: irregularly irregular rhythm and 1-2 sem Abdomen: soft, non-tender; bowel sounds normal; no masses,  no organomegaly Extremities: s/p L knee surgery with mild swelling Skin: Skin color, texture, turgor normal. No rashes or lesions Neurologic: Grossly normal   Rate: 95-110  Rhythm: AF  Lab Results:   Recent Labs  11/13/13 0438 11/14/13 0422  NA 139 138  K 4.7 4.2  CL 102 103  CO2 27 22  GLUCOSE 93 85  BUN 20 18  CREATININE 0.92 0.77   CBC Latest Ref Rng 11/14/2013 11/13/2013 11/11/2013  WBC 4.0 - 10.5 K/uL 11.7(H) 11.0(H) 12.5(H)  Hemoglobin 12.0 - 15.0 g/dL 8.3(L) 8.6(L) 8.8(L)  Hematocrit 36.0 - 46.0 % 25.6(L) 27.3(L) 27.6(L)  Platelets 150 - 400 K/uL 245 210 161     No results found for this basename: TROPONINI, CK, MB,  in the last 72 hours  Hepatic Function Panel No results found for this basename: PROT, ALBUMIN, AST, ALT, ALKPHOS, BILITOT, BILIDIR, IBILI,  in the last 72 hours No results found for this basename: INR,  in the last 72 hours  BNP (last 3 results)  Recent Labs  11/10/13 2346 11/13/13 0438  PROBNP 6059.0* 2654.0*    Lipid Panel  No results found for this basename: chol,  trig,  hdl,  cholhdl,  vldl,  ldlcalc      Imaging:  Echo Study Conclusions  - Left ventricle: The cavity size was normal. There was severe concentric hypertrophy. Systolic function was vigorous. The estimated ejection fraction was in the range of 65% to 70%. Wall motion was normal; there were no regional wall motion abnormalities. Features are consistent with a pseudonormal left ventricular filling pattern, with concomitant abnormal relaxation and increased filling pressure (grade 2 diastolic dysfunction). Doppler parameters are consistent with elevated ventricular end-diastolic filling pressure. - Aortic valve: Trileaflet; mildly thickened, mildly calcified leaflets. There was no regurgitation. - Aortic root: The aortic root was normal in size. - Mitral valve: Structurally normal valve. - Left atrium: The atrium was mildly dilated. - Right ventricle: The cavity size was normal. Wall thickness was mildly increased. Systolic function was normal. - Right  atrium: The atrium was normal in size. - Pulmonary arteries: Systolic pressure was within the normal range.  Impressions:  - Severe left ventricular hypertrophy with intracavitary gradient 16 mmHg. At least mild right ventricular hypertrophy. Infiltrative cardiomyopathy such as amyloidosis should be considered.    Assessment/Plan:   Active Problems:   Hypertension   Thyroid disease   GERD (gastroesophageal reflux disease)   Coffee ground emesis   Protein-calorie malnutrition,  severe   Atrial fibrillation with RVR   Slurred speech   Acute diastolic heart failure   Atrial flutter  1. AF: Rate continues to fluctuate; now increased. Will further titrate beta blocker particularly with vigorous LV contraction and intracavitary gradient. On eliquis for NOAC anticoagulation.  Will f/u ECG today 2. Elevated BNP contributed by diastolic heart failure in setting of AFRVR with severe LVH #. Severe LVH with 16 mm intracavitary gradient and hyperdynamic LV fxn;  Avoid dehydration.  ? Infiltrative  3. Anemia H/H 8.5/25.6 today, reduced from 8.8/27.6 on 8/9 4. UTI 5. MRI with diffuse white matter disease without acute CVA 6. h/o Breast Ca   Troy Sine, MD, [facility] 11/15/2013, 7:53 AM

## 2013-11-15 NOTE — Progress Notes (Signed)
PT Cancellation Note  Patient Details Name: [name] MRN: [id number] DOB: 1925-05-04   Cancelled Treatment:    Reason Eval/Treat Not Completed: Medical issues which prohibited therapy (while  checking monitor, noted HR jumping up/down into 120's)   [name] 11/15/2013, 3:05 PM

## 2013-11-15 NOTE — Progress Notes (Signed)
Patient ID: Natasha Fletcher, female   DOB: 25-Oct-1925, 80 y.o.   MRN: [id number] [facility] PROGRESS NOTE  Arilyn [place] WNU:[id number] DOB: 1926-02-16 DOA: 11/10/2013 PCP: Myriam Jacobson, MD  Brief narrative: 80 year old with recent left TKA, recent admission for UTI, encephalopathy who presented from SNF for new atrial fibrillation with RVR. Hospital course somewhat complicated with ongoing tachycardia. She was started on Eliquis. Cardiology is assisting the management. Pt continuously tachycardic.  Assessment/Plan:   Principal Problem:  Atrial fibrillation with RVR  Started on Eliquis. No evidence of bleeding.  HR this am in 130 range. Appreciate cardiology assistance.  Cardizem is now 180 mg daily and metoprolol 50 mg PO BID  Active Problems:  Acute on chronic diastolic CHF  Likely from atrial fibrillation. BNP trended down from 6000 --> 2000 range Hypertension  Continue Cardizem and metoprolol per cardio recommendations.   GERD  Continue protonix H/O breast ca  Continue anastrozole  Protein-calorie malnutrition, severe  Encourage PO intake  Continue ensure supplements  Slurred speech  MRI no acute intracranial findings  Urinary retention, acute  Continue foley. Voiding trial in SNF in 1-2 weeks UTI, E.Coli  Was treated adequately DVT Prophylaxis  On Eliquis    Code Status: DNR/DNI  Family Communication: plan of care discussed with the patient  Disposition: to SNF when stable  IV Access:   Peripheral IV Procedures and diagnostic studies:    Mr Brain Wo Contrast 11/20/2013 1. Stable appearance of diffuse white matter disease. This likely reflects the sequela of chronic microvascular ischemia. 2. No acute intracranial abnormality. 3. Persistent bilateral mastoid effusions, right greater than left.   Medical Consultants:   Cardiology  Other Consultants:   Physical therapy   Anti-Infectives:   Rocephin 11/10/2013 --> 11/11/2013  Cipro 11/11/2013 -->  11/13/2013       Leisa Lenz, MD  [facility] Pager [phone]  If 7PM-7AM, please contact night-coverage [email or web address] Password [facility] 11/15/2013, 11:33 AM   LOS: 5 days    HPI/Subjective: No acute overnight events. Has pain over right side of chest wall.  Objective: Filed Vitals:   11/14/13 0930 11/14/13 1344 11/14/13 2018 11/15/13 0411  BP:  91/44 116/57 121/59  Pulse: 152 83 86 97  Temp:  97.5 F (36.4 C) 99.3 F (37.4 C) 98.8 F (37.1 C)  TempSrc:  Oral Oral Oral  Resp:  20 18 20   Height:      Weight:    64 kg (141 lb 1.5 oz)  SpO2:  100% 100% 99%    Intake/Output Summary (Last 24 hours) at 11/15/13 1133 Last data filed at 11/15/13 0900  Gross per 24 hour  Intake    480 ml  Output    650 ml  Net   -170 ml    Exam:   General:  Pt is alert, follows commands appropriately, not in acute distress  Cardiovascular: tachycardic, S1/S2 appreciated, soft SEM appreciated   Respiratory: Clear to auscultation bilaterally, no wheezing, no crackles  Abdomen: Soft, non tender, non distended, bowel sounds present  Extremities: No edema, pulses DP and PT palpable bilaterally  Neuro: Grossly nonfocal  Data Reviewed: Basic Metabolic Panel:  Recent Labs Lab 11/10/13 1739 11/11/13 0605 11/13/13 0438 11/14/13 0422  NA 140 140 139 138  K 4.5 3.8 4.7 4.2  CL 106 104 102 103  CO2 22 25 27 22   GLUCOSE 110* 100* 93 85  BUN 11 9 20 18   CREATININE 0.75 0.76 0.92 0.77  CALCIUM 9.1  8.7 9.0 8.9   Liver Function Tests: No results found for this basename: AST, ALT, ALKPHOS, BILITOT, PROT, ALBUMIN,  in the last 168 hours No results found for this basename: LIPASE, AMYLASE,  in the last 168 hours No results found for this basename: AMMONIA,  in the last 168 hours CBC:  Recent Labs Lab 11/10/13 1739 11/11/13 0605 11/13/13 0438 11/14/13 0422  WBC 15.3* 12.5* 11.0* 11.7*  NEUTROABS 13.1*  --   --   --   HGB 9.5* 8.8* 8.6* 8.3*  HCT 30.9* 27.6* 27.3* 25.6*   MCV 90.4 89.6 90.4 88.6  PLT 184 161 210 245   Cardiac Enzymes:  Recent Labs Lab 11/10/13 1739 11/10/13 2346 11/11/13 0600 11/11/13 1149  TROPONINI <0.30 <0.30 <0.30 <0.30   BNP: No components found with this basename: POCBNP,  CBG: No results found for this basename: GLUCAP,  in the last 168 hours  URINE CULTURE     Status: None   Collection Time    11/05/13  3:37 PM      Result Value Ref Range Status   Specimen Description URINE, CATHETERIZED   Final   Value: ESCHERICHIA COLI     Performed at [facility]   Report Status 11/07/2013 FINAL   Final   Organism ID, Bacteria ESCHERICHIA COLI   Final  CULTURE, BLOOD (ROUTINE X 2)     Status: None   Collection Time    11/05/13  5:32 PM      Result Value Ref Range Status   Specimen Description BLOOD RIGHT ANTECUBITAL   Final   Value: NO GROWTH 5 DAYS     Performed at [facility]   Report Status 11/11/2013 FINAL   Final  MRSA PCR SCREENING     Status: None   Collection Time    11/05/13  7:09 PM      Result Value Ref Range Status   MRSA by PCR NEGATIVE  NEGATIVE Final  CULTURE, BLOOD (ROUTINE X 2)     Status: None   Collection Time    11/05/13  7:30 PM      Result Value Ref Range Status   Specimen Description BLOOD LEFT ARM   Final   Value: NO GROWTH 5 DAYS     Performed at [facility]   Report Status 11/12/2013 FINAL   Final     Scheduled Meds: . anastrozole  1 mg Oral Daily  . apixaban  2.5 mg Oral BID  . diltiazem  180 mg Oral Daily  . feeding supplement (RESOURCE BREEZE)  1 Container Oral TID BM  . LORazepam  0.25 mg Oral Once  . metoprolol tartrate  50 mg Oral BID  . pantoprazole  40 mg Oral BID  . predniSONE  5 mg Oral Q breakfast

## 2013-11-16 NOTE — Progress Notes (Addendum)
ANTICOAGULATION CONSULT NOTE - follow-up  Pharmacy Consult for apixaban Indication: atrial fibrillation  Allergies  Allergen Reactions  . Milk-Related Compounds Other (See Comments)    Upset stomach   . Sulfa Antibiotics Other (See Comments)    Reaction many years ago-unknown    Patient Measurements: Height: 5\' 6"  (167.6 cm) Weight: 141 lb 5 oz (64.1 kg) IBW/kg (Calculated) : 59.3   Vital Signs:    Labs:  Recent Labs  11/14/13 0422  HGB 8.3*  HCT 25.6*  PLT 245  CREATININE 0.77    Estimated Creatinine Clearance: 47.3 ml/min (by C-G formula based on Cr of 0.77).   Medical History: Past Medical History  Diagnosis Date  . Hypertension   . Asthma   . Glaucoma   . Incontinence   . Wears dentures   . Wears glasses   . Thyroid disease     "something years ago"  . GERD (gastroesophageal reflux disease)   . Cancer     rt breast  . Edema leg   . Fast breathing     "because of pill"  . Pneumonia     h/o younger  . Arthritis     "all over"  . S/P total knee arthroplasty   . Glaucoma   . Swelling     bilateral feet/ legs, more left.    Assessment: 80 yoF with new onset afib with RVR and CHADSVASc score of 4. Pharmacy consulted to dose eliquis (requesting lower dose d/t age, wt and recent coffee-ground emesis).  PMH includes recent left knee arthroplastly on 7/15, recent admission for ARF (SCr 7.2) in setting of volume depletion and e coli UTI.   Pt remains in Afib.   No bleeding noted.    Hgb 8.3 (slowly trending down), pltc WNL.    Possible drug interactions with apixaban: diltiazem  Goal of Therapy:  Appropriate anticoagulation for elderly pt with afib  Plan:   Continue apixaban 2.5mg  PO BID  Pharmacy education on apixaban completed 8/10  Monitoring for bleeding/CBC  Pharmacy will follow at distance, do not anticipate further dose adjustment will be needed  Doreene Eland, PharmD, BCPS.   Pager: [phone] 11/16/2013 12:09 PM

## 2013-11-16 NOTE — Progress Notes (Addendum)
Subjective:  Feels much tter but mild residual r sided lower chest wall soreness  Objective:   Vital Signs in the last 24 hours: Temp:  [97.8 F (36.6 C)-98.2 F (36.8 C)] 98.2 F (36.8 C) (08/13 2108) Pulse Rate:  [95-110] 95 (08/13 2108) Resp:  [20] 20 (08/13 2108) BP: (96-106)/(46-53) 96/46 mmHg (08/13 2108) SpO2:  [100 %] 100 % (08/13 2108) Weight:  [141 lb 5 oz (64.1 kg)] 141 lb 5 oz (64.1 kg) (08/13 2108)  Intake/Output from previous day: -440 08/13 0701 - 08/14 0700 In: 360 [P.O.:360] Out: 800 [Urine:800]  I/O since admission: -4735  Medications: . anastrozole  1 mg Oral Daily  . apixaban  2.5 mg Oral BID  . diltiazem  180 mg Oral Daily  . feeding supplement (RESOURCE BREEZE)  1 Container Oral TID BM  . latanoprost  1 drop Both Eyes QHS  . LORazepam  0.25 mg Oral Once  . metoprolol tartrate  50 mg Oral BID  . pantoprazole  40 mg Oral BID  . predniSONE  5 mg Oral Q breakfast  . sodium chloride  3 mL Intravenous Q12H       Physical Exam:   General appearance: alert, cooperative and no distress Neck: no adenopathy, no carotid bruit, no JVD, supple, symmetrical, trachea midline and thyroid not enlarged, symmetric, no tenderness/mass/nodules Lungs: clear to auscultation bilaterally Heart: irregularly irregular rhythm and 1-2 sem; rate better controlled Abdomen: soft, non-tender; bowel sounds normal; no masses,  no organomegaly Extremities: s/p L knee surgery with mild swelling Skin: Skin color, texture, turgor normal. No rashes or lesions Neurologic: Grossly normal   Rate: 80 - 85, much improved rate  Rhythm: AF  Lab Results:   Recent Labs  11/14/13 0422  NA 138  K 4.2  CL 103  CO2 22  GLUCOSE 85  BUN 18  CREATININE 0.77   CBC Latest Ref Rng 11/14/2013 11/13/2013 11/11/2013  WBC 4.0 - 10.5 K/uL 11.7(H) 11.0(H) 12.5(H)  Hemoglobin 12.0 - 15.0 g/dL 8.3(L) 8.6(L) 8.8(L)  Hematocrit 36.0 - 46.0 % 25.6(L) 27.3(L) 27.6(L)  Platelets 150 - 400 K/uL 245  210 161    No results found for this basename: TROPONINI, CK, MB,  in the last 72 hours  Hepatic Function Panel No results found for this basename: PROT, ALBUMIN, AST, ALT, ALKPHOS, BILITOT, BILIDIR, IBILI,  in the last 72 hours No results found for this basename: INR,  in the last 72 hours  BNP (last 3 results)  Recent Labs  11/10/13 2346 11/13/13 0438  PROBNP 6059.0* 2654.0*    Lipid Panel  No results found for this basename: chol,  trig,  hdl,  cholhdl,  vldl,  ldlcalc      Imaging:  Echo Study Conclusions  - Left ventricle: The cavity size was normal. There was severe concentric hypertrophy. Systolic function was vigorous. The estimated ejection fraction was in the range of 65% to 70%. Wall motion was normal; there were no regional wall motion abnormalities. Features are consistent with a pseudonormal left ventricular filling pattern, with concomitant abnormal relaxation and increased filling pressure (grade 2 diastolic dysfunction). Doppler parameters are consistent with elevated ventricular end-diastolic filling pressure. - Aortic valve: Trileaflet; mildly thickened, mildly calcified leaflets. There was no regurgitation. - Aortic root: The aortic root was normal in size. - Mitral valve: Structurally normal valve. - Left atrium: The atrium was mildly dilated. - Right ventricle: The cavity size was normal. Wall thickness was mildly increased. Systolic function was normal. -  Right atrium: The atrium was normal in size. - Pulmonary arteries: Systolic pressure was within the normal range.  Impressions:  - Severe left ventricular hypertrophy with intracavitary gradient 16 mmHg. At least mild right ventricular hypertrophy. Infiltrative cardiomyopathy such as amyloidosis should be considered.  ECG yesterday AF with increased rate at 130   Assessment/Plan:   Active Problems:   Hypertension   Thyroid disease   GERD (gastroesophageal reflux disease)    Coffee ground emesis   Protein-calorie malnutrition, severe   Atrial fibrillation with RVR   Slurred speech   Acute diastolic heart failure   Atrial flutter  1. AF: Tolerating further titrate beta blocker titration with rate now in low 80's  particularly with vigorous LV contraction and intracavitary gradient. On eliquis for NOAC anticoagulation.  Now on lopressor 50 mg bid and cardizem 180 mg. 2. Elevated BNP contributed by diastolic heart failure in setting of AFRVR with severe LVH; improving f/u BNP pending #. Severe LVH with 16 mm intracavitary gradient and hyperdynamic LV fxn;  Avoid dehydration.  ? Infiltrative  3. Anemia H/H 8.5/25.6 today, reduced from 8.8/27.6 on 8/9 4. UTI 5. MRI with diffuse white matter disease without acute CVA 6. h/o Breast Ca   Troy Sine, MD, [facility] 11/16/2013, 7:32 AM

## 2013-11-16 NOTE — Progress Notes (Signed)
Patient ID: Natasha Fletcher, female   DOB: March 09, 1926, 80 y.o.   MRN: [id number]  [facility] PROGRESS NOTE  Natasha Fletcher VZD:[id number] DOB: Aug 11, 1925 DOA: 11/10/2013 PCP: Myriam Jacobson, MD  Brief narrative:  80 year old with recent left TKA, recent admission for UTI, encephalopathy who presented from SNF for new atrial fibrillation with RVR. Hospital course somewhat complicated with ongoing tachycardia. She was started on Eliquis. Cardiology is assisting the management. Pt continuously tachycardic.   Assessment/Plan:   Principal Problem:  Atrial fibrillation with RVR  Started on Eliquis. No evidence of bleeding.  HR this am in 130 range. Appreciate cardiology assistance.  Cardizem is now 180 mg daily and metoprolol 50 mg PO BID Remains stable and tolerating current regimen well  Active Problems:  Acute on chronic diastolic CHF  Likely from atrial fibrillation. BNP trended down from 6000 --> 2000 range Repeat BNP in AM Hypertension  Continue Cardizem and metoprolol per cardio recommendations.  BP is on the soft side this AM 106/53 so will need to closely monitor  GERD  Continue protonix H/O breast ca  Continue anastrozole  Protein-calorie malnutrition, severe  Encourage PO intake  Continue ensure supplements  Slurred speech  MRI no acute intracranial findings but chronic ischemic changes are noted  Urinary retention, acute  Continue foley. Voiding trial in SNF in 1-2 weeks UTI, E.Coli  Was treated adequately  DVT Prophylaxis  On Eliquis   Code Status: DNR/DNI  Family Communication: plan of care discussed with the patient  Disposition: to SNF when stable   IV Access:   Peripheral IV Procedures and diagnostic studies:    Mr Brain Wo Contrast 29-Nov-2013 Stable appearance of diffuse white matter disease, sequela of chronic microvascular ischemia, no acute intracranial abnormality.   CXR 11/15/2013  Enlargement of cardiac silhouette with mild pulmonary edema  and minimal RIGHT pleural effusion.   Medical Consultants:   Cardiology   Other Consultants:   Physical therapy   Anti-Infectives:   Rocephin 11/10/2013 --> 11/11/2013  Cipro 11/11/2013 --> 11/13/2013   Leisa Lenz, MD  [facility] Pager [phone]  If 7PM-7AM, please contact night-coverage [email or web address] Password [facility] 11/16/2013, 9:32 AM   LOS: 6 days   HPI/Subjective: No acute overnight events.  Objective: Filed Vitals:   11/14/13 0549 11/15/13 0411 11/15/13 1328 11/15/13 2108  BP:  121/59 106/53 96/46  Pulse:  97 110 95  Temp:  98.8 F (37.1 C) 97.8 F (36.6 C) 98.2 F (36.8 C)  TempSrc:  Oral Oral Oral  Resp:  20 20 20   Height:      Weight: 64.3 kg (141 lb 12.1 oz) 64 kg (141 lb 1.5 oz)  64.1 kg (141 lb 5 oz)  SpO2:  99% 100% 100%    Intake/Output Summary (Last 24 hours) at 11/16/13 0932 Last data filed at 11/16/13 0610  Gross per 24 hour  Intake    240 ml  Output    800 ml  Net   -560 ml    Exam:   General:  Pt is alert, follows commands appropriately, not in acute distress  Cardiovascular: Irregular rate and rhythm, SEM 2-3/6  Respiratory: Clear to auscultation bilaterally, no wheezing, no crackles, no rhonchi  Abdomen: Soft, non tender, non distended, bowel sounds present  Extremities: pulses DP and PT palpable bilaterally, mild left knee swelling   Neuro: Grossly nonfocal  Data Reviewed: Basic Metabolic Panel:  Recent Labs Lab 11/10/13 1739 11/11/13 0605 11/13/13 0438 11/14/13 0422  NA 140 140  139 138  K 4.5 3.8 4.7 4.2  CL 106 104 102 103  CO2 22 25 27 22   GLUCOSE 110* 100* 93 85  BUN 11 9 20 18   CREATININE 0.75 0.76 0.92 0.77  CALCIUM 9.1 8.7 9.0 8.9   CBC:  Recent Labs Lab 11/10/13 1739 11/11/13 0605 11/13/13 0438 11/14/13 0422  WBC 15.3* 12.5* 11.0* 11.7*  NEUTROABS 13.1*  --   --   --   HGB 9.5* 8.8* 8.6* 8.3*  HCT 30.9* 27.6* 27.3* 25.6*  MCV 90.4 89.6 90.4 88.6  PLT 184 161 210 245   Cardiac  Enzymes:  Recent Labs Lab 11/10/13 1739 11/10/13 2346 11/11/13 0600 11/11/13 1149  TROPONINI <0.30 <0.30 <0.30 <0.30   Scheduled Meds: . anastrozole  1 mg Oral Daily  . apixaban  2.5 mg Oral BID  . diltiazem  180 mg Oral Daily  . latanoprost  1 drop Both Eyes QHS  . LORazepam  0.25 mg Oral Once  . metoprolol tartrate  50 mg Oral BID  . pantoprazole  40 mg Oral BID  . predniSONE  5 mg Oral Q breakfast   Continuous Infusions:

## 2013-11-16 NOTE — Progress Notes (Addendum)
Physical Therapy Treatment Patient Details Name: Natasha Fletcher MRN: [id number] DOB: Aug 02, 1925 Today's Date: 11/16/2013    History of Present Illness 80 yo female admitted 11/10/13 with weakness, ECG  in ED  noted A fib with RVR. pt was recnetly admitted 11/05/13 with confusion, N/V , abdominal pain. Found to be, in acute renal failure, hyperkalemia,  uti. Pt s/p LTKA 10/15/13. pt was admitted  after return to [place].    PT Comments    Pt OOB in recliner.  Assisted with amb very limited distance.  Pt demon 3/4 DOE and fatigues quickly. Amb twice with one long sitting rest break.  Performed her TKR TE's.  Pt will need ST Rehab at SNF as she is too unsteady and requires assit with mobility.  Her elder sister to whom she lives with is unable to physically assist pt at home safely.  Follow Up Recommendations  SNF ([facility], pt stated she did NOT want to go back to [facility])     Equipment Recommendations       Recommendations for Other Services       Precautions / Restrictions Precautions Precautions: Fall Precaution Comments: monitor HR Restrictions Weight Bearing Restrictions: No Other Position/Activity Restrictions: WBAT    Mobility  Bed Mobility               General bed mobility comments: Pt OOB in recliner  Transfers Overall transfer level: Needs assistance Equipment used: Rolling walker (2 wheeled) Transfers: Sit to/from Stand Sit to Stand: Mod assist         General transfer comment: 50% verbal cues for safe technique and hand placement plus increased time to clear hips fully off surface.  Poor standing posture of forward flex hips.  Ambulation/Gait Ambulation/Gait assistance: Min assist;Mod assist Ambulation Distance (Feet): 22 Feet (11 feet x 2 one long sitting rest break) Assistive device: Rolling walker (2 wheeled) Gait Pattern/deviations: Step-to pattern;Trunk flexed Gait velocity: decreased   General Gait Details: verbal cues for safe technique, pt  fatigues quickly limiting distance, began increase in RR, dyspnea and fatigue   Stairs            Wheelchair Mobility    Modified Rankin (Stroke Patients Only)       Balance                                    Cognition                            Exercises   Total Knee Replacement TE's 10 reps B LE ankle pumps 10 reps towel squeezes 10 reps knee presses 10 reps heel slides  10 reps SAQ's 10 reps SLR's 10 reps ABD     General Comments        Pertinent Vitals/Pain      Home Living                      Prior Function            PT Goals (current goals can now be found in the care plan section) Progress towards PT goals: Progressing toward goals    Frequency  Min 3X/week    PT Plan      Co-evaluation             End of Session Equipment Utilized During Treatment: Gait belt Activity Tolerance:  Patient limited by fatigue Patient left: in chair;with call bell/phone within reach;with chair alarm set;with family/visitor present     Time: 1510-1550 PT Time Calculation (min): 40 min  Charges:  $Therapeutic Exercise: 8-22 mins $Therapeutic Activity: 8-22 mins                    G Codes:      Rica Koyanagi  PTA [facility]  Acute  Rehab Pager      [phone]

## 2013-11-17 LAB — BASIC METABOLIC PANEL
ANION GAP: 10 (ref 5–15)
BUN: 20 mg/dL (ref 6–23)
CALCIUM: 8.9 mg/dL (ref 8.4–10.5)
CO2: 25 mEq/L (ref 19–32)
CREATININE: 0.9 mg/dL (ref 0.50–1.10)
Chloride: 103 mEq/L (ref 96–112)
GFR calc non Af Amer: 56 mL/min — ABNORMAL LOW (ref >90)
GFR, EST AFRICAN AMERICAN: 65 mL/min — AB (ref >90)
Glucose, Bld: 91 mg/dL (ref 70–99)
Potassium: 4 mEq/L (ref 3.7–5.3)
SODIUM: 138 meq/L (ref 137–147)

## 2013-11-17 LAB — CBC
HCT: 24.3 % — ABNORMAL LOW (ref 36.0–46.0)
Hemoglobin: 7.8 g/dL — ABNORMAL LOW (ref 12.0–15.0)
MCH: 28.1 pg (ref 26.0–34.0)
MCHC: 32.1 g/dL (ref 30.0–36.0)
MCV: 87.4 fL (ref 78.0–100.0)
PLATELETS: 347 10*3/uL (ref 150–400)
RBC: 2.78 MIL/uL — ABNORMAL LOW (ref 3.87–5.11)
RDW: 16.2 % — AB (ref 11.5–15.5)
WBC: 10.8 10*3/uL — ABNORMAL HIGH (ref 4.0–10.5)

## 2013-11-17 LAB — PREPARE RBC (CROSSMATCH)

## 2013-11-17 LAB — PRO B NATRIURETIC PEPTIDE: Pro B Natriuretic peptide (BNP): 3143 pg/mL — ABNORMAL HIGH (ref 0–450)

## 2013-11-17 MED ORDER — TRAMADOL HCL 50 MG PO TABS: 50.0000 mg | ORAL_TABLET | Freq: Four times a day (QID) | ORAL | Status: DC | PRN

## 2013-11-17 MED ORDER — POLYETHYLENE GLYCOL 3350 17 G PO PACK: 17.0000 g | PACK | Freq: Every day | ORAL | Status: DC | PRN

## 2013-11-17 MED ORDER — METOPROLOL TARTRATE 50 MG PO TABS: 50.0000 mg | ORAL_TABLET | Freq: Two times a day (BID) | ORAL | Status: DC

## 2013-11-17 MED ORDER — BISACODYL 5 MG PO TBEC: 5.0000 mg | DELAYED_RELEASE_TABLET | Freq: Every day | ORAL | Status: DC | PRN

## 2013-11-17 MED ORDER — SODIUM CHLORIDE 0.9 % IV SOLN
Freq: Once | INTRAVENOUS | Status: AC
  Administered 2013-11-17: 12:00:00 via INTRAVENOUS

## 2013-11-17 MED ORDER — BOOST / RESOURCE BREEZE PO LIQD
1.0000 | Freq: Three times a day (TID) | ORAL | Status: DC
Start: 2013-11-17 — End: 2013-12-31

## 2013-11-17 MED ORDER — OXYCODONE HCL 5 MG PO TABS: 5.0000 mg | ORAL_TABLET | ORAL | Status: DC | PRN

## 2013-11-17 MED ORDER — DILTIAZEM HCL ER COATED BEADS 180 MG PO CP24: 180.0000 mg | ORAL_CAPSULE | Freq: Every day | ORAL | Status: DC

## 2013-11-17 MED ORDER — APIXABAN 2.5 MG PO TABS: 2.5000 mg | ORAL_TABLET | Freq: Two times a day (BID) | ORAL | Status: DC

## 2013-11-17 NOTE — Progress Notes (Signed)
Report called to [facility] (Cherie). Stacey Drain

## 2013-11-17 NOTE — Progress Notes (Signed)
Subjective:  Feels much tter but mild residual r sided lower chest wall soreness  Objective:   Vital Signs in the last 24 hours: Temp:  [97.8 F (36.6 C)-98.2 F (36.8 C)] 98.2 F (36.8 C) (08/15 0446) Pulse Rate:  [76-78] 78 (08/15 0446) Resp:  [18] 18 (08/15 0446) BP: (97-105)/(47-75) 97/50 mmHg (08/15 0446) SpO2:  [99 %-100 %] 100 % (08/15 0446) Weight:  [141 lb 8.6 oz (64.2 kg)] 141 lb 8.6 oz (64.2 kg) (08/15 0446)  Intake/Output from previous day: -440 08/14 0701 - 08/15 0700 In: 720 [P.O.:720] Out: 925 [Urine:925]  I/O since admission: -4735  Medications: . sodium chloride   Intravenous Once  . anastrozole  1 mg Oral Daily  . apixaban  2.5 mg Oral BID  . diltiazem  180 mg Oral Daily  . feeding supplement (RESOURCE BREEZE)  1 Container Oral TID BM  . latanoprost  1 drop Both Eyes QHS  . LORazepam  0.25 mg Oral Once  . metoprolol tartrate  50 mg Oral BID  . pantoprazole  40 mg Oral BID  . predniSONE  5 mg Oral Q breakfast  . sodium chloride  3 mL Intravenous Q12H       Physical Exam:   General appearance: alert, cooperative and no distress Neck: no adenopathy, no carotid bruit, no JVD, supple, symmetrical, trachea midline and thyroid not enlarged, symmetric, no tenderness/mass/nodules Lungs: clear to auscultation bilaterally Heart: irregularly irregular rhythm and 1-2 sem; rate better controlled Abdomen: soft, non-tender; bowel sounds normal; no masses,  no organomegaly Extremities: s/p L knee surgery with mild swelling Skin: Skin color, texture, turgor normal. No rashes or lesions Neurologic: Grossly normal   Rate: 80 - 85, much improved rate  Rhythm: AF  Lab Results:   Recent Labs  11/17/13 0442  NA 138  K 4.0  CL 103  CO2 25  GLUCOSE 91  BUN 20  CREATININE 0.90   CBC Latest Ref Rng 11/17/2013 11/14/2013 11/13/2013  WBC 4.0 - 10.5 K/uL 10.8(H) 11.7(H) 11.0(H)  Hemoglobin 12.0 - 15.0 g/dL 7.8(L) 8.3(L) 8.6(L)  Hematocrit 36.0 - 46.0 %  24.3(L) 25.6(L) 27.3(L)  Platelets 150 - 400 K/uL 347 245 210    No results found for this basename: TROPONINI, CK, MB,  in the last 72 hours  Hepatic Function Panel No results found for this basename: PROT, ALBUMIN, AST, ALT, ALKPHOS, BILITOT, BILIDIR, IBILI,  in the last 72 hours No results found for this basename: INR,  in the last 72 hours  BNP (last 3 results)  Recent Labs  11/10/13 2346 11/13/13 0438 11/17/13 0442  PROBNP 6059.0* 2654.0* 3143.0*    Lipid Panel  No results found for this basename: chol,  trig,  hdl,  cholhdl,  vldl,  ldlcalc      Imaging:  Echo Study Conclusions  - Left ventricle: The cavity size was normal. There was severe concentric hypertrophy. Systolic function was vigorous. The estimated ejection fraction was in the range of 65% to 70%. Wall motion was normal; there were no regional wall motion abnormalities. Features are consistent with a pseudonormal left ventricular filling pattern, with concomitant abnormal relaxation and increased filling pressure (grade 2 diastolic dysfunction). Doppler parameters are consistent with elevated ventricular end-diastolic filling pressure. - Aortic valve: Trileaflet; mildly thickened, mildly calcified leaflets. There was no regurgitation. - Aortic root: The aortic root was normal in size. - Mitral valve: Structurally normal valve. - Left atrium: The atrium was mildly dilated. - Right ventricle: The cavity size  was normal. Wall thickness was mildly increased. Systolic function was normal. - Right atrium: The atrium was normal in size. - Pulmonary arteries: Systolic pressure was within the normal range.  Impressions:  - Severe left ventricular hypertrophy with intracavitary gradient 16 mmHg. At least mild right ventricular hypertrophy. Infiltrative cardiomyopathy such as amyloidosis should be considered.  ECG yesterday AF with increased rate at 130   Assessment/Plan:   Active Problems:    Hypertension   Thyroid disease   GERD (gastroesophageal reflux disease)   Coffee ground emesis   Protein-calorie malnutrition, severe   Atrial fibrillation with RVR   Slurred speech   Acute diastolic heart failure   Atrial flutter  1. AF: Tolerating further titrate beta blocker titration with rate now in low 80's  particularly with vigorous LV contraction and intracavitary gradient. On eliquis for NOAC anticoagulation.  Now on lopressor 50 mg bid and cardizem 180 mg. She is relatively asymptomatic.  I think that she could go home while in A-fib , on Eliquis and see Dr. Claiborne Billings in the office in several weeks  for elective cardioversion.  Alternatively, we could do TEE cardioversion sooner  if she is symptomatic from her Afib.  Her symptoms  Of fatigue and mild dyspnea with exertion may be due to her anemia   2. Elevated BNP contributed by diastolic heart failure in setting of AFRVR with severe LVH;   She does not appear to be volume overloaded on exam. I would not empirically diurese her (especially in light of her mid LV gradient)   #. Severe LVH with 16 mm intracavitary gradient and hyperdynamic LV fxn;  Avoid dehydration.  ? Infiltrative  3. Anemia:  This will need to be worked up  - especially since she needs anticoagulation 4. UTI 5. MRI with diffuse white matter disease without acute CVA 6. h/o Breast Ca   Thayer Headings, Brooke Bonito., MD, [facility] 11/17/2013, 8:48 AM [address]  [place] Pager [phone]

## 2013-11-17 NOTE — Discharge Summary (Signed)
Physician Discharge Summary  Natasha Fletcher NTZ:[id number] DOB: [birth year] DOA: 11/10/2013  PCP: Myriam Jacobson, MD  Admit date: 11/10/2013 Discharge date: 11/17/2013  Recommendations for Outpatient Follow-up:  1. Please note that the pt needs to follow up with Dr. Shelva Majestic of cardiology in next 2 weeks for elective cardioversion. 2. Monitor for bleeds since pt on Eliquis. 3. BP stable prior to discharge. 102/45. 4. 4. Pt received 1 unit PRBC transfusion prior to discharge.   Discharge Diagnoses:  Active Problems:   Hypertension   Thyroid disease   GERD (gastroesophageal reflux disease)   Coffee ground emesis   Protein-calorie malnutrition, severe   Atrial fibrillation with RVR   Slurred speech   Acute diastolic heart failure   Atrial flutter    Discharge Condition: stable   Diet recommendation: as tolerated   History of present illness:  80 year old with recent left TKA, recent admission for UTI, encephalopathy who presented from SNF for new atrial fibrillation with RVR. Hospital course somewhat complicated with ongoing tachycardia. She was started on Eliquis. Cardiology is assisting the management.  Assessment/Plan:    Principal Problem:  Atrial fibrillation with RVR   Started on Eliquis. No evidence of bleeding.   HR normalized over past 24 hours  Cardizem is now 180 mg daily and metoprolol 50 mg PO BID   Remains stable and tolerating current regimen well   Needs elective cardioversion in 2 weeks from discharge as stated above. Active Problems:  Acute on chronic diastolic CHF   Likely from atrial fibrillation. BNP trended down from 6000 --> 2000 range   Continue metoprolol and Cardizem  Anticoagulation with Elquis  Hypertension   Continue Cardizem and metoprolol per cardio recommendations.   Continue current meds. Norvasc stopped since pt on Cardizem and metoprolol.  Anemia of chronic disease   Secondary to history of malignancy.  Hemoglobin  9.5 on 11/11/10//2015 and this am 7.8.  Transfuse 1 unit PRBC. No signs of acute bleed. GERD   Continue protonix H/O breast ca   Continue anastrozole  Protein-calorie malnutrition, severe   Encourage PO intake   Continue ensure supplements  Slurred speech   MRI no acute intracranial findings but chronic ischemic changes are noted  Urinary retention, acute   Continue foley. Voiding trial in SNF in 1-2 weeks UTI, E.Coli / Leukocytosis  Was treated adequately DVT Prophylaxis   On Eliquis    Code Status: DNR/DNI  Family Communication: plan of care discussed with the patient   IV Access:   Peripheral IV  Procedures and diagnostic studies:   Mr Brain Wo Contrast 11/12/2013 Stable appearance of diffuse white matter disease, sequela of chronic microvascular ischemia, no acute intracranial abnormality.  CXR 11/15/2013 Enlargement of cardiac silhouette with mild pulmonary edema and minimal RIGHT pleural effusion.   Medical Consultants:   Cardiology   Other Consultants:   Physical therapy   Anti-Infectives:   Rocephin 11/10/2013 --> 11/11/2013  Cipro 11/11/2013 --> 11/13/2013     Signed:  Leisa Lenz, MD  [facility] 11/17/2013, 2:19 PM  Pager #: [phone]   Discharge Exam: Filed Vitals:   11/17/13 1332  BP: 102/45  Pulse: 74  Temp: 98.9 F (37.2 C)  Resp: 18   Filed Vitals:   11/17/13 0446 11/17/13 1205 11/17/13 1232 11/17/13 1332  BP: 97/50 103/54 117/53 102/45  Pulse: 78 72 77 74  Temp: 98.2 F (36.8 C) 98 F (36.7 C) 97.5 F (36.4 C) 98.9 F (37.2 C)  TempSrc: Oral Oral  Oral Oral  Resp: 18 18 18 18   Height:      Weight: 64.2 kg (141 lb 8.6 oz)     SpO2: 100% 100% 98% 100%    General: Pt is alert, follows commands appropriately, not in acute distress Cardiovascular: irregular rhythm, S1/S2 +, no murmurs Respiratory: Clear to auscultation bilaterally, no wheezing, no crackles, no rhonchi Abdominal: Soft, non tender, non distended, bowel  sounds +, no guarding Extremities: no edema, no cyanosis, pulses palpable bilaterally DP and PT Neuro: Grossly nonfocal  Discharge Instructions  Discharge Instructions   Call MD for:  difficulty breathing, headache or visual disturbances    Complete by:  As directed      Call MD for:  persistant dizziness or light-headedness    Complete by:  As directed      Call MD for:  persistant nausea and vomiting    Complete by:  As directed      Call MD for:  redness, tenderness, or signs of infection (pain, swelling, redness, odor or green/yellow discharge around incision site)    Complete by:  As directed      Diet - low sodium heart healthy    Complete by:  As directed      Discharge instructions    Complete by:  As directed   Patient needs to follow up in cardiology office Dr. Claiborne Billings in few weeks from discharge for elective cardioversion. Per cardiology it is fine that patient is discharged today.     Increase activity slowly    Complete by:  As directed             Medication List    STOP taking these medications       amLODipine 5 MG tablet  Commonly known as:  NORVASC     ciprofloxacin 500 MG tablet  Commonly known as:  CIPRO      TAKE these medications       acetaminophen 325 MG tablet  Commonly known as:  TYLENOL  Take 2 tablets (650 mg total) by mouth every 6 (six) hours as needed for mild pain (or Fever >/= 101).     anastrozole 1 MG tablet  Commonly known as:  ARIMIDEX  Take 1 tablet (1 mg total) by mouth daily.     apixaban 2.5 MG Tabs tablet  Commonly known as:  ELIQUIS  Take 1 tablet (2.5 mg total) by mouth 2 (two) times daily.     bisacodyl 5 MG EC tablet  Commonly known as:  DULCOLAX  Take 1 tablet (5 mg total) by mouth daily as needed for moderate constipation.     diltiazem 180 MG 24 hr capsule  Commonly known as:  CARDIZEM CD  Take 1 capsule (180 mg total) by mouth daily.     feeding supplement (RESOURCE BREEZE) Liqd  Take 1 Container by mouth 3  (three) times daily between meals.     latanoprost 0.005 % ophthalmic solution  Commonly known as:  XALATAN  Place 1 drop into both eyes at bedtime.     metoprolol 50 MG tablet  Commonly known as:  LOPRESSOR  Take 1 tablet (50 mg total) by mouth 2 (two) times daily.     ondansetron 4 MG tablet  Commonly known as:  ZOFRAN  Take 1 tablet (4 mg total) by mouth every 6 (six) hours as needed for nausea.     oxyCODONE 5 MG immediate release tablet  Commonly known as:  Oxy IR/ROXICODONE  Take 1-2 tablets (  5-10 mg total) by mouth every 4 (four) hours as needed for severe pain.     pantoprazole 40 MG tablet  Commonly known as:  PROTONIX  Take 1 tablet (40 mg total) by mouth 2 (two) times daily.     polyethylene glycol packet  Commonly known as:  MIRALAX / GLYCOLAX  Take 17 g by mouth daily as needed for mild constipation.     predniSONE 5 MG tablet  Commonly known as:  DELTASONE  Take 5 mg by mouth daily with breakfast.     traMADol 50 MG tablet  Commonly known as:  ULTRAM  Take 1-2 tablets (50-100 mg total) by mouth every 6 (six) hours as needed (mild pain).           Follow-up Information   Follow up with ROBERTS, [name], MD. Schedule an appointment as soon as possible for a visit in 2 weeks. (Follow up appt after recent hospitalization)    Specialty:  Internal Medicine   Contact information:   [place], [place] [address] [phone]       Follow up with Shelva Majestic A, MD. Schedule an appointment as soon as possible for a visit in 2 weeks. (Follow up appt after recent hospitalization)    Specialty:  Cardiology   Contact information:   [address] [place] [address] [phone]        The results of significant diagnostics from this hospitalization (including imaging, microbiology, ancillary and laboratory) are listed below for reference.    Significant Diagnostic Studies: Mr Brain Wo Contrast  11/12/2013   CLINICAL DATA:   Transient slurred speech, dizziness, and left hand paresthesias. Atrial fibrillation.  EXAM: MRI HEAD WITHOUT CONTRAST  TECHNIQUE: Multiplanar, multiecho pulse sequences of the brain and surrounding structures were obtained without intravenous contrast.  COMPARISON:  MRI brain 07/11/2013.  FINDINGS: No acute infarct, hemorrhage, or mass lesion is present. Extensive periventricular and subcortical confluent white matter changes are present bilaterally. No hemorrhage or mass lesion is present. White matter changes extend into the brainstem as before. A remote lacunar infarct is present within the right thalamus.  Flow is present in the major intracranial arteries. The patient is status post bilateral lens replacements. The globes and orbits are otherwise intact. The paranasal sinuses are clear. There is fluid in the mastoid air cells bilaterally, right greater than left. No obstructing nasopharyngeal lesion is evident.  IMPRESSION: 1. Stable appearance of diffuse white matter disease. This likely reflects the sequela of chronic microvascular ischemia. 2. No acute intracranial abnormality. 3. Persistent bilateral mastoid effusions, right greater than left.   Electronically Signed   By: Lawrence Santiago M.D.   On: 11/12/2013 11:04   US Renal  11/05/2013   CLINICAL DATA:  Acute renal failure  EXAM: RENAL/URINARY TRACT ULTRASOUND COMPLETE  COMPARISON:  None.  FINDINGS: Right Kidney:  Length: 10.0 cm. 2 cm lower pole cyst. 1 cm midpole cyst. Echogenicity within normal limits. No mass or hydronephrosis visualized.  Left Kidney:  Length: 9.8 cm. Lower pole cyst measuring 15 mm. Echogenicity within normal limits. No mass or hydronephrosis visualized.  Bladder:  Foley catheter in the bladder.  Urinary bladder is empty.  IMPRESSION: Negative for renal obstruction.  Normal renal size.   Electronically Signed   By: Franchot Gallo M.D.   On: 11/05/2013 20:04   Dg Chest Port 1 View  11/15/2013   CLINICAL DATA:  RIGHT lateral  chest wall/rib cage pain worse with inspiration  EXAM:  PORTABLE CHEST - 1 VIEW  COMPARISON:  Portable exam 0950 hr compared 11/10/2013  FINDINGS: Enlargement of cardiac silhouette.  Mediastinal contours and pulmonary vascularity normal.  Atherosclerotic calcification.  Interstitial infiltrates in both lungs slightly increased since previous exam suggesting pulmonary edema.  Minimal RIGHT pleural effusion.  No pneumothorax.  Bones demineralized.  IMPRESSION: Enlargement of cardiac silhouette with mild pulmonary edema and minimal RIGHT pleural effusion.   Electronically Signed   By: Lavonia Dana M.D.   On: 11/15/2013 11:28   Dg Chest Portable 1 View  11/10/2013   CLINICAL DATA:  ATRIAL FIBRILLATION  EXAM: PORTABLE CHEST - 1 VIEW  COMPARISON:  11/05/2013  FINDINGS: The heart is mildly enlarged. There is mild prominence of interstitial markings but no definite pulmonary edema. There are no focal consolidations or pleural effusions.  IMPRESSION: Mild cardiomegaly.   Electronically Signed   By: Shon Hale M.D.   On: 11/10/2013 17:55   Dg Chest Port 1 View  11/05/2013   CLINICAL DATA:  Asthma and chest pain ; breast carcinoma  EXAM: PORTABLE CHEST - 1 VIEW  COMPARISON:  February 15, 2013  FINDINGS: There is no edema or consolidation. Patchy interstitial fibrosis remains without change. Heart is upper normal in size with pulmonary vascularity within normal limits. No adenopathy. There is atherosclerotic change in aorta. Bones are somewhat osteoporotic. There is evidence of prior trauma in the lateral right clavicle with widening of the acromioclavicular joint on the right.  IMPRESSION: Underlying patchy interstitial fibrosis. No edema or consolidation. Widening of the acromioclavicular joint on the right, more apparent than on prior study.   Electronically Signed   By: Lowella Grip M.D.   On: 11/05/2013 15:24    Microbiology: No results found for this or any previous visit (from the past 240 hour(s)).    Labs: Basic Metabolic Panel:  Recent Labs Lab 11/10/13 1739 11/11/13 0605 11/13/13 0438 11/14/13 0422 11/17/13 0442  NA 140 140 139 138 138  K 4.5 3.8 4.7 4.2 4.0  CL 106 104 102 103 103  CO2 22 25 27 22 25   GLUCOSE 110* 100* 93 85 91  BUN 11 9 20 18 20   CREATININE 0.75 0.76 0.92 0.77 0.90  CALCIUM 9.1 8.7 9.0 8.9 8.9   Liver Function Tests: No results found for this basename: AST, ALT, ALKPHOS, BILITOT, PROT, ALBUMIN,  in the last 168 hours No results found for this basename: LIPASE, AMYLASE,  in the last 168 hours No results found for this basename: AMMONIA,  in the last 168 hours CBC:  Recent Labs Lab 11/10/13 1739 11/11/13 0605 11/13/13 0438 11/14/13 0422 11/17/13 0442  WBC 15.3* 12.5* 11.0* 11.7* 10.8*  NEUTROABS 13.1*  --   --   --   --   HGB 9.5* 8.8* 8.6* 8.3* 7.8*  HCT 30.9* 27.6* 27.3* 25.6* 24.3*  MCV 90.4 89.6 90.4 88.6 87.4  PLT 184 161 210 245 347   Cardiac Enzymes:  Recent Labs Lab 11/10/13 1739 11/10/13 2346 11/11/13 0600 11/11/13 1149  TROPONINI <0.30 <0.30 <0.30 <0.30   BNP: BNP (last 3 results)  Recent Labs  11/10/13 2346 11/13/13 0438 11/17/13 0442  PROBNP 6059.0* 2654.0* 3143.0*   CBG: No results found for this basename: GLUCAP,  in the last 168 hours  Time coordinating discharge: Over 30 minutes

## 2013-11-17 NOTE — Progress Notes (Addendum)
Patient is set to discharge to [facility] today. Patient & brother, Natasha Fletcher aware. Discharge packet in [place], [facility] aware. [facility] called for transport.   Clinical Social Work Department CLINICAL SOCIAL WORK PLACEMENT NOTE 11/19/2013  Patient:  Natasha Fletcher, Natasha Fletcher  Account Number:  [id number] Admit date:  11/10/2013  Clinical Social Worker:  Renold Genta  Date/time:  11/12/2013 04:14 PM  Clinical Social Work is seeking post-discharge placement for this patient at the following level of care:   SKILLED NURSING   (*CSW will update this form in Epic as items are completed)   11/12/2013  Patient/family provided with [place] Department of Clinical Social Work's list of facilities offering this level of care within the geographic area requested by the patient (or if unable, by the patient's family).  11/12/2013  Patient/family informed of their freedom to choose among providers that offer the needed level of care, that participate in Medicare, Medicaid or managed care program needed by the patient, have an available bed and are willing to accept the patient.  11/12/2013  Patient/family informed of [facility]' ownership interest in [facility], as well as of the fact that they are under no obligation to receive care at this facility.  PASARR submitted to EDS on 11/12/2013 PASARR number received on 11/12/2013  FL2 transmitted to all facilities in geographic area requested by pt/family on  11/12/2013 FL2 transmitted to all facilities within larger geographic area on   Patient informed that his/her managed care company has contracts with or will negotiate with  certain facilities, including the following:     Patient/family informed of bed offers received:  11/15/2013 Patient chooses bed at [facility] Physician recommends and patient chooses bed at    Patient to be transferred to [place] on   11/17/2013 Patient to be transferred to facility by [facility] Patient and family notified of transfer on 11/17/2013 Name of family member notified:  patient's brother, Natasha Fletcher  The following physician request were entered in Epic:   Additional Comments:   Raynaldo Opitz, [place] Social Worker cell #: [phone]

## 2013-11-18 LAB — TYPE AND SCREEN
ABO/RH(D): O POS
ANTIBODY SCREEN: NEGATIVE
UNIT DIVISION: 0

## 2013-12-07 ENCOUNTER — Ambulatory Visit: Payer: Medicare Other | Admitting: Physician Assistant

## 2013-12-17 ENCOUNTER — Emergency Department (HOSPITAL_COMMUNITY)
Admission: EM | Admit: 2013-12-17 | Discharge: 2013-12-17 | Disposition: A | Discharge status: Home / Self Care | Payer: Medicare Other

## 2013-12-17 ENCOUNTER — Encounter (HOSPITAL_COMMUNITY): Payer: Self-pay | Admitting: Emergency Medicine

## 2013-12-17 NOTE — ED Notes (Signed)
Pt sts she needs larger catheter bag changed out from leg bag.

## 2013-12-21 ENCOUNTER — Telehealth: Payer: Self-pay | Admitting: Cardiology

## 2013-12-21 MED ORDER — METOPROLOL TARTRATE 50 MG PO TABS: 50.0000 mg | ORAL_TABLET | Freq: Two times a day (BID) | ORAL | Status: DC

## 2013-12-21 MED ORDER — APIXABAN 2.5 MG PO TABS: 2.5000 mg | ORAL_TABLET | Freq: Two times a day (BID) | ORAL | Status: DC

## 2013-12-21 MED ORDER — DILTIAZEM HCL ER COATED BEADS 180 MG PO CP24: 180.0000 mg | ORAL_CAPSULE | Freq: Every day | ORAL | Status: DC

## 2013-12-21 MED ORDER — PANTOPRAZOLE SODIUM 40 MG PO TBEC: 40.0000 mg | DELAYED_RELEASE_TABLET | Freq: Two times a day (BID) | ORAL | Status: DC

## 2013-12-21 NOTE — Telephone Encounter (Signed)
Mrs. Scism has some questions concerning her medications .Marland Kitchen Please call   Thanks

## 2013-12-21 NOTE — Telephone Encounter (Signed)
Spoke with patient. She had just been discharged home from rehab about 1 week ago and was going to pharmacy to get her medications and she wanted to know what each medication was for and how necessary each medication was for her condition. RN conversed with and explained to patient each medication and the purpose for it. Refills sent in to pharmacy for patient. Patient has OV on 9/82 with [place], [place]

## 2013-12-24 ENCOUNTER — Telehealth: Payer: Self-pay | Admitting: Cardiovascular Disease

## 2013-12-24 NOTE — Telephone Encounter (Signed)
Pt called in stating that Dr. Claiborne Billings will be her doctor and is recently coming out of rehab. She said that she has been feeling very tired and having SOB when she goes up and down her stairs. She would like to know is there something she could take to help her SOB. Please call  Thanks

## 2013-12-24 NOTE — Telephone Encounter (Signed)
Returned call to patient no answer.Unable to leave a message no answering machine.

## 2013-12-26 NOTE — Telephone Encounter (Signed)
Spoke with pt, she has had SOB for about 2 weeks now. It occurs when active in the home and with ADL's, she denies orthopnea. She reports she has swelling in both feet, left greater than right. The swelling is gone in the morning but comes back after being up for short period of time. She has an appt today with her primary care doctor and she has an appt with [facility] pa c Monday the 28th. She wanted to know if dr Claiborne Billings wanted her to do anything prior to appt Monday. Will forward for dr Claiborne Billings review

## 2013-12-30 NOTE — Telephone Encounter (Signed)
Will review at [facility]

## 2013-12-31 ENCOUNTER — Encounter: Payer: Self-pay | Admitting: Cardiology

## 2013-12-31 ENCOUNTER — Ambulatory Visit (INDEPENDENT_AMBULATORY_CARE_PROVIDER_SITE_OTHER): Payer: Medicare Other | Admitting: Cardiology

## 2013-12-31 VITALS — BP 134/80 | HR 93 | Ht 66.0 in | Wt 151.7 lb

## 2013-12-31 DIAGNOSIS — I4891 Unspecified atrial fibrillation: Secondary | ICD-10-CM

## 2013-12-31 DIAGNOSIS — Z7901 Long term (current) use of anticoagulants: Secondary | ICD-10-CM

## 2013-12-31 DIAGNOSIS — I5031 Acute diastolic (congestive) heart failure: Secondary | ICD-10-CM

## 2013-12-31 DIAGNOSIS — I10 Essential (primary) hypertension: Secondary | ICD-10-CM

## 2013-12-31 DIAGNOSIS — Z96659 Presence of unspecified artificial knee joint: Secondary | ICD-10-CM

## 2013-12-31 DIAGNOSIS — R339 Retention of urine, unspecified: Secondary | ICD-10-CM

## 2013-12-31 DIAGNOSIS — Z96651 Presence of right artificial knee joint: Secondary | ICD-10-CM

## 2013-12-31 LAB — CBC WITH DIFFERENTIAL/PLATELET
Basophils Absolute: 0 10*3/uL (ref 0.0–0.1)
Basophils Relative: 0 % (ref 0–1)
Eosinophils Absolute: 0.1 10*3/uL (ref 0.0–0.7)
Eosinophils Relative: 1 % (ref 0–5)
HCT: 33.1 % — ABNORMAL LOW (ref 36.0–46.0)
Hemoglobin: 10.4 g/dL — ABNORMAL LOW (ref 12.0–15.0)
Lymphocytes Relative: 20 % (ref 12–46)
Lymphs Abs: 1.2 10*3/uL (ref 0.7–4.0)
MCH: 26.9 pg (ref 26.0–34.0)
MCHC: 31.4 g/dL (ref 30.0–36.0)
MCV: 85.5 fL (ref 78.0–100.0)
Monocytes Absolute: 0.3 10*3/uL (ref 0.1–1.0)
Monocytes Relative: 5 % (ref 3–12)
Neutro Abs: 4.5 10*3/uL (ref 1.7–7.7)
Neutrophils Relative %: 74 % (ref 43–77)
Platelets: 268 10*3/uL (ref 150–400)
RBC: 3.87 MIL/uL (ref 3.87–5.11)
RDW: 15.2 % (ref 11.5–15.5)
WBC: 6.1 10*3/uL (ref 4.0–10.5)

## 2013-12-31 NOTE — Assessment & Plan Note (Signed)
Controlled.  

## 2013-12-31 NOTE — Assessment & Plan Note (Signed)
S/P Rt TKR July 2015 followed by admission for acute renal failure, UTI, anemia

## 2013-12-31 NOTE — Progress Notes (Signed)
12/31/2013 Natasha Fletcher   [birth year]  [id number]  Primary Physicia ROBERTS, [name], MD Primary Cardiologist: Dr Claiborne Billings (per hospital records)  HPI:  80 y/o female admitted for Rt TKR 10/08/13. Post op she had anemia requiring transfusion, urinary retention, and dehydration. She was discharged to SNF for rehab 10/20/13.  On 11/05/13 she was admitted through the ER with anorexia, nausea and vomiting, confusion, and abdominal pain. She was found to have acute renal failure with a SCr of 7.20 and a K+ of 6.2. This improved with placement of a foley, stopping her ACE, and IV hydration. She was treated for E Coli UTI with IV antibiotics.  She was discharged 11/08/13 and admitted again on 11/10/13 with near syncope and tachycardia. She was noted to be in AF with RVR. She was treated with Diltiazem and beta blocker. Her rhythm appeared to A flutter. She was seen in consult 11/11/13. There was concern she had had a stroke but MRI revealed diffuse white matter disease without CVA. During that admission she was felt to have acute diastolic CHF which was treated. Echo showed an EF of 65-70% with grade 2 diastolic dysfunction. She had significant intracavity gradient of 16 mmHg.             She is seen in the office today for cardiac follow up and to discuss elective OP cardioversion. Her brother accompanied her to the office. She was discharged 11/17/13 on Eliquis 2.5 mg BID but did not fill the prescription till last week because of cost. She was also unsure of some of her other medications. Overall she is doing OK., She does have DOE- ie: walking up stairs, and she has some LE edema.    Current Outpatient Prescriptions  Medication Sig Dispense Refill  . anastrozole (ARIMIDEX) 1 MG tablet Take 1 tablet (1 mg total) by mouth daily.  30 tablet  5  . apixaban (ELIQUIS) 2.5 MG TABS tablet Take 1 tablet (2.5 mg total) by mouth 2 (two) times daily.  60 tablet  2  . diltiazem (CARDIZEM CD) 180 MG 24 hr capsule Take 1  capsule (180 mg total) by mouth daily.  30 capsule  2  . furosemide (LASIX) 20 MG tablet Take 20 mg by mouth daily.      Marland Kitchen latanoprost (XALATAN) 0.005 % ophthalmic solution Place 1 drop into both eyes at bedtime.      . metoprolol (LOPRESSOR) 50 MG tablet Take 1 tablet (50 mg total) by mouth 2 (two) times daily.  60 tablet  2  . oxybutynin (DITROPAN) 5 MG tablet Take 5 mg by mouth 3 (three) times daily as needed (urinary frequency).      Marland Kitchen oxyCODONE (OXY IR/ROXICODONE) 5 MG immediate release tablet Take 1-2 tablets (5-10 mg total) by mouth every 4 (four) hours as needed for severe pain.  30 tablet  0  . polyethylene glycol (MIRALAX / GLYCOLAX) packet Take 17 g by mouth daily as needed for mild constipation.  14 each  0  . predniSONE (DELTASONE) 5 MG tablet Take 5 mg by mouth daily with breakfast.      . tamsulosin (FLOMAX) 0.4 MG CAPS capsule Take 0.4 mg by mouth daily.      . traMADol (ULTRAM) 50 MG tablet Take 1-2 tablets (50-100 mg total) by mouth every 6 (six) hours as needed (mild pain).  60 tablet  1   No current facility-administered medications for this visit.    Allergies  Allergen Reactions  .  Milk-Related Compounds Other (See Comments)    Upset stomach   . Sulfa Antibiotics Other (See Comments)    Reaction many years ago-unknown    History   Social History  . Marital Status: Single    Spouse Name: N/A    Number of Children: N/A  . Years of Education: N/A   Occupational History  . Not on file.   Social History Main Topics  . Smoking status: Former Smoker -- 0.50 packs/day for 30 years    Types: Cigarettes    Quit date: 04/21/1975  . Smokeless tobacco: Never Used  . Alcohol Use: Yes     Comment: occasionally  . Drug Use: No  . Sexual Activity: Not Currently   Other Topics Concern  . Not on file   Social History Narrative   From SNF, getting PT and using cane.       Review of Systems: General: negative for chills, fever, night sweats or weight changes.    Cardiovascular: negative for chest pain, dyspnea on exertion, edema, orthopnea, palpitations, paroxysmal nocturnal dyspnea or shortness of breath Dermatological: negative for rash Respiratory: negative for cough or wheezing Urologic: negative for hematuria Abdominal: negative for nausea, vomiting, diarrhea, bright red blood per rectum, melena, or hematemesis Neurologic: negative for visual changes, syncope, or dizziness All other systems reviewed and are otherwise negative except as noted above.    Blood pressure 134/80, pulse 93, height 5\' 6"  (1.676 m), weight 151 lb 11.2 oz (68.811 kg).  General appearance: alert, cooperative and no distress Lungs: clear to auscultation bilaterally Heart: irregularly irregular rhythm Extremities: 1+ edema  EKG A flutter with variable VR  ASSESSMENT AND PLAN:   Atrial fibrillation with RVR Her rate is better but she has DOE and LE edema.  Acute diastolic heart failure Recent admission with AF with RVR, urinary retention, ARF, anemia, and diastolic CHF. Currently stable.  Urinary retention She has a foley in place- to see Urology next week.  S/P total knee arthroplasty S/P Rt TKR July 2015 followed by admission for acute renal failure, UTI, anemia  Hypertension Controlled  Anticoagulated She was discharged 11/17/13 on Eliquis but didn't start it till last week.    PLAN  I had a long discussion with the pt and her brother. The pt does appear to have some mild cognitive impairment. I explained the importance of compliance with Eliquis to the pt and her brother. She will need to be anticoagulated for 4 weeks before we consider cardioversion. I ordered a BMP and CBC today. She will return in 3 weeks to be set up for OP DCCV. I did not change her medications for her rate but suggested she could take an extra Lasix 2-3 times a week for edema if needed. If her CBC shows significant anemia she will need a GI work up. She has a follow up with urology  scheduled.   Gurnie Duris KPA-C 12/31/2013 9:49 AM

## 2013-12-31 NOTE — Patient Instructions (Addendum)
Lab work today ( bmet,cbc ) [employer]   Your physician recommends that you schedule a follow-up appointment in: 3 weeks   Thurs 01/24/14 at 8:20 am

## 2013-12-31 NOTE — Assessment & Plan Note (Signed)
She has a foley in place- to see Urology next week.

## 2013-12-31 NOTE — Assessment & Plan Note (Signed)
Her rate is better but she has DOE and LE edema.

## 2013-12-31 NOTE — Assessment & Plan Note (Addendum)
She was discharged 11/17/13 on Eliquis but didn't start it till last week.

## 2013-12-31 NOTE — Assessment & Plan Note (Signed)
Recent admission with AF with RVR, urinary retention, ARF, anemia, and diastolic CHF. Currently stable.

## 2014-01-01 LAB — BASIC METABOLIC PANEL
BUN: 10 mg/dL (ref 6–23)
CO2: 26 mEq/L (ref 19–32)
Calcium: 9.4 mg/dL (ref 8.4–10.5)
Chloride: 104 mEq/L (ref 96–112)
Creat: 0.82 mg/dL (ref 0.50–1.10)
Glucose, Bld: 83 mg/dL (ref 70–99)
Potassium: 4 mEq/L (ref 3.5–5.3)
Sodium: 140 mEq/L (ref 135–145)

## 2014-01-23 ENCOUNTER — Ambulatory Visit: Payer: Medicare Other | Admitting: Cardiology

## 2014-01-24 ENCOUNTER — Encounter: Payer: Self-pay | Admitting: Cardiology

## 2014-01-24 ENCOUNTER — Ambulatory Visit (INDEPENDENT_AMBULATORY_CARE_PROVIDER_SITE_OTHER): Payer: Medicare Other | Admitting: Cardiology

## 2014-01-24 VITALS — BP 137/69 | HR 68 | Ht 66.0 in | Wt 151.3 lb

## 2014-01-24 DIAGNOSIS — C50211 Malignant neoplasm of upper-inner quadrant of right female breast: Secondary | ICD-10-CM | POA: Insufficient documentation

## 2014-01-24 DIAGNOSIS — Z79899 Other long term (current) drug therapy: Secondary | ICD-10-CM

## 2014-01-24 DIAGNOSIS — R5383 Other fatigue: Secondary | ICD-10-CM

## 2014-01-24 DIAGNOSIS — I5032 Chronic diastolic (congestive) heart failure: Secondary | ICD-10-CM | POA: Insufficient documentation

## 2014-01-24 DIAGNOSIS — D649 Anemia, unspecified: Secondary | ICD-10-CM | POA: Insufficient documentation

## 2014-01-24 DIAGNOSIS — I119 Hypertensive heart disease without heart failure: Secondary | ICD-10-CM | POA: Insufficient documentation

## 2014-01-24 DIAGNOSIS — I480 Paroxysmal atrial fibrillation: Secondary | ICD-10-CM

## 2014-01-24 DIAGNOSIS — D689 Coagulation defect, unspecified: Secondary | ICD-10-CM

## 2014-01-24 LAB — CBC
HCT: 34 % — ABNORMAL LOW (ref 36.0–46.0)
Hemoglobin: 10.7 g/dL — ABNORMAL LOW (ref 12.0–15.0)
MCH: 25.8 pg — ABNORMAL LOW (ref 26.0–34.0)
MCHC: 31.5 g/dL (ref 30.0–36.0)
MCV: 82.1 fL (ref 78.0–100.0)
Platelets: 229 10*3/uL (ref 150–400)
RBC: 4.14 MIL/uL (ref 3.87–5.11)
RDW: 15.3 % (ref 11.5–15.5)
WBC: 5 10*3/uL (ref 4.0–10.5)

## 2014-01-24 LAB — PROTIME-INR
INR: 1.39 (ref <1.50)
Prothrombin Time: 17.1 seconds — ABNORMAL HIGH (ref 11.6–15.2)

## 2014-01-24 LAB — COMPREHENSIVE METABOLIC PANEL
ALT: 30 U/L (ref 0–35)
AST: 37 U/L (ref 0–37)
Albumin: 3.7 g/dL (ref 3.5–5.2)
Alkaline Phosphatase: 79 U/L (ref 39–117)
BUN: 11 mg/dL (ref 6–23)
CO2: 24 mEq/L (ref 19–32)
Calcium: 9.4 mg/dL (ref 8.4–10.5)
Chloride: 103 mEq/L (ref 96–112)
Creat: 0.73 mg/dL (ref 0.50–1.10)
Glucose, Bld: 74 mg/dL (ref 70–99)
Potassium: 3.9 mEq/L (ref 3.5–5.3)
Sodium: 139 mEq/L (ref 135–145)
Total Bilirubin: 1.2 mg/dL (ref 0.2–1.2)
Total Protein: 6.3 g/dL (ref 6.0–8.3)

## 2014-01-24 LAB — APTT: aPTT: 41 seconds — ABNORMAL HIGH (ref 24–37)

## 2014-01-24 LAB — TSH: TSH: 1.986 u[IU]/mL (ref 0.350–4.500)

## 2014-01-24 MED ORDER — METOPROLOL TARTRATE 50 MG PO TABS: 75.0000 mg | ORAL_TABLET | Freq: Two times a day (BID) | ORAL | Status: DC

## 2014-01-24 NOTE — Assessment & Plan Note (Signed)
Nl LVF with severe LVH by echo

## 2014-01-24 NOTE — Assessment & Plan Note (Signed)
She remains in atrial fibrillation. Currently asymptomatic.

## 2014-01-24 NOTE — Progress Notes (Signed)
01/24/2014 Natasha Fletcher   1925-08-29  [id number]  Primary Physicia ROBERTS, [name], MD Primary Cardiologist: Dr Claiborne Billings  HPI:  80 y/o female admitted for Rt TKR 10/08/13. Post op she had anemia requiring transfusion, urinary retention, and dehydration. She was discharged to SNF for rehab 10/20/13. On 11/05/13 she was admitted through the ER with anorexia, nausea and vomiting, confusion, and abdominal pain. She was found to have acute renal failure with a SCr of 7.20 and a K+ of 6.2. This improved with placement of a foley, stopping her ACE, and IV hydration. She was treated for E Coli UTI with IV antibiotics. She was discharged 11/08/13 and admitted again on 11/10/13 with near syncope and tachycardia. She was noted to be in AF with RVR. She was treated with Diltiazem and beta blocker. Her rhythm appeared to A flutter. She was seen in consult 11/11/13. There was concern she had had a stroke but MRI revealed diffuse white matter disease without CVA. During that admission she was felt to have acute diastolic CHF which was treated. Echo showed an EF of 65-70% with grade 2 diastolic dysfunction. She had significant intracavity gradient of 16 mmHg. She was also anemic during her hospitalization and was transfused one unit before discharge. The attending MD described this as chronic anemia. F/U Hgb on 12/31/13 was stable at 10.4.            She is seen in the office 12/31/13 for cardiac follow up and to discuss elective OP cardioversion. Her brother accompanied her to the office. She was discharged 11/17/13 on Eliquis 2.5 mg BID but did not fill the prescription because of cost. She was also unsure of some of her other medications. She apparently has some mild cognitive impairment. We provided her samples of Eliquis and she is here now for follow up and to arrange for an OP DCCV. She remains in AF. Her rate is still a little fast. She has some LE edema which has been chronic but no CHF. She and her brother report  compliance with Eliquis.     Current Outpatient Prescriptions  Medication Sig Dispense Refill  . anastrozole (ARIMIDEX) 1 MG tablet Take 1 tablet (1 mg total) by mouth daily.  30 tablet  5  . apixaban (ELIQUIS) 2.5 MG TABS tablet Take 1 tablet (2.5 mg total) by mouth 2 (two) times daily.  60 tablet  2  . diltiazem (CARDIZEM CD) 180 MG 24 hr capsule Take 1 capsule (180 mg total) by mouth daily.  30 capsule  2  . furosemide (LASIX) 20 MG tablet Take 20 mg by mouth daily.      Marland Kitchen latanoprost (XALATAN) 0.005 % ophthalmic solution Place 1 drop into both eyes at bedtime.      . polyethylene glycol (MIRALAX / GLYCOLAX) packet Take 17 g by mouth daily as needed for mild constipation.  14 each  0  . tamsulosin (FLOMAX) 0.4 MG CAPS capsule Take 0.4 mg by mouth daily.      . traMADol (ULTRAM) 50 MG tablet Take 1-2 tablets (50-100 mg total) by mouth every 6 (six) hours as needed (mild pain).  60 tablet  1  . metoprolol (LOPRESSOR) 50 MG tablet Take 1 tablet (50 mg total) by mouth 2 (two) times daily.  60 tablet  2   No current facility-administered medications for this visit.    Allergies  Allergen Reactions  . Milk-Related Compounds Other (See Comments)    Upset stomach   .  Sulfa Antibiotics Other (See Comments)    Reaction many years ago-unknown    History   Social History  . Marital Status: Single    Spouse Name: N/A    Number of Children: N/A  . Years of Education: N/A   Occupational History  . Not on file.   Social History Main Topics  . Smoking status: Former Smoker -- 0.50 packs/day for 30 years    Types: Cigarettes    Quit date: 04/21/1975  . Smokeless tobacco: Never Used  . Alcohol Use: Yes     Comment: occasionally  . Drug Use: No  . Sexual Activity: Not Currently   Other Topics Concern  . Not on file   Social History Narrative   From SNF, getting PT and using cane.       Review of Systems: General: negative for chills, fever, night sweats or weight changes.    Cardiovascular: negative for chest pain, dyspnea on exertion, edema, orthopnea, palpitations, paroxysmal nocturnal dyspnea or shortness of breath Dermatological: negative for rash Respiratory: negative for cough or wheezing Urologic: negative for hematuria Abdominal: negative for nausea, vomiting, diarrhea, bright red blood per rectum, melena, or hematemesis Neurologic: negative for visual changes, syncope, or dizziness All other systems reviewed and are otherwise negative except as noted above.    Blood pressure 137/69, pulse 68, height 5\' 6"  (1.676 m), weight 151 lb 4.8 oz (68.629 kg).  General appearance: alert, cooperative and no distress Neck: no carotid bruit and no JVD Lungs: clear to auscultation bilaterally Heart: irregularly irregular rhythm Extremities: 1-2+ bilat LE edema Rt. Lt. Compression stockings in place.  Rectal- Heme negative, brown stool  EKG AF with VR 125  ASSESSMENT AND PLAN:   Atrial fibrillation with RVR She remains in atrial fibrillation. Currently asymptomatic.  Hypertensive cardiovascular disease Nl LVF with severe LVH by echo  Chronic diastolic CHF (congestive heart failure) She has some chronic LE edema but no CHF on exam.   Anticoagulated She reports compliance with Eliquis after she was given samples at her LOV  Anemia She received one unit of blood in the hospital. F/U Hgb in Sept was 10.4. She denies melena. Rectal exam in the office today was negative.   Breast cancer On Arimidex    PLAN  Increase Metoprolol to 75 mg BID. OP DCCV in one week. She has been instructed to avoid salt.   [name] KPA-C 01/24/2014 8:59 AM

## 2014-01-24 NOTE — Assessment & Plan Note (Signed)
She reports compliance with Eliquis after she was given samples at her LOV

## 2014-01-24 NOTE — Assessment & Plan Note (Signed)
On Arimidex 

## 2014-01-24 NOTE — Assessment & Plan Note (Signed)
She received one unit of blood in the hospital. F/U Hgb in Sept was 10.4. She denies melena.

## 2014-01-24 NOTE — Patient Instructions (Addendum)
Your physician has recommended that you have a Cardioversion (DCCV). Electrical Cardioversion uses a jolt of electricity to your heart either through paddles or wired patches attached to your chest. This is a controlled, usually prescheduled, procedure. Defibrillation is done under light anesthesia in the hospital, and you usually go home the day of the procedure. This is done to get your heart back into a normal rhythm. You are not awake for the procedure. Please see the instruction sheet given to you today.  Your physician recommends that you return for lab work   Your physician has recommended you make the following change in your medication: Increase Metoprolol to 1 1/2 tablets twice daily

## 2014-01-24 NOTE — Assessment & Plan Note (Signed)
She has some chronic LE edema but no CHF on exam.

## 2014-01-25 ENCOUNTER — Encounter (HOSPITAL_COMMUNITY): Payer: Self-pay | Admitting: Pharmacy Technician

## 2014-01-29 ENCOUNTER — Encounter (HOSPITAL_COMMUNITY): Payer: Self-pay | Admitting: Critical Care Medicine

## 2014-01-29 ENCOUNTER — Encounter (HOSPITAL_COMMUNITY)
Admission: RE | Disposition: A | Discharge status: Home / Self Care | Payer: Self-pay | Source: Ambulatory Visit | Attending: Cardiology

## 2014-01-29 ENCOUNTER — Ambulatory Visit (HOSPITAL_COMMUNITY)
Admission: RE | Admit: 2014-01-29 | Discharge: 2014-01-29 | Disposition: A | Discharge status: Home / Self Care | Payer: Medicare Other | Source: Ambulatory Visit | Attending: Cardiology | Admitting: Cardiology

## 2014-01-29 ENCOUNTER — Other Ambulatory Visit: Payer: Self-pay | Admitting: Cardiology

## 2014-01-29 ENCOUNTER — Telehealth: Payer: Self-pay | Admitting: *Deleted

## 2014-01-29 DIAGNOSIS — I4819 Other persistent atrial fibrillation: Secondary | ICD-10-CM

## 2014-01-29 SURGERY — Surgical Case

## 2014-01-29 MED ORDER — APIXABAN 5 MG PO TABS: 5.0000 mg | ORAL_TABLET | Freq: Two times a day (BID) | ORAL | Status: DC

## 2014-01-29 NOTE — Progress Notes (Signed)
Patient's TEE/DCCV has been scheduled for 02/01/14 at 10:00 AM with Dr Marlou Porch; pt instructed to be in endoscopy at 8:30 AM; instructed to increase apixaban to 5 mg po BID; increase lasix to 40 mg daily; will arrange FU in office next week. Kirk Ruths

## 2014-01-29 NOTE — H&P (Signed)
Lise Pincus Pieri  01/24/2014 8:30 AM   Office Visit  MRN:  [id number]   Description: Female DOB: May 26, 1925  Provider: Erlene Quan, PA-C  Department: [facility]          Vital Signs Most recent update: 01/24/2014  8:19 AM by Hollace Kinnier, CMA      BP Pulse Ht Wt BMI      137/69 68 5\' 6"  (1.676 m) 151 lb 4.8 oz (68.629 kg) 24.43 kg/m2             Progress Notes      Atrial fibrillation with RVR - Erlene Quan, PA-C at 01/24/2014  8:44 AM      Status: Written Related Problem: Atrial fibrillation with RVR    She remains in atrial fibrillation. Currently asymptomatic.         Hypertensive cardiovascular disease - Erlene Quan, PA-C at 01/24/2014  8:46 AM      Status: Written Related Problem: Hypertensive cardiovascular disease    Nl LVF with severe LVH by echo         Chronic diastolic CHF (congestive heart failure) - Erlene Quan, PA-C at 01/24/2014  8:48 AM      Status: Written Related Problem: Chronic diastolic CHF (congestive heart failure)    She has some chronic LE edema but no CHF on exam.           Anticoagulated - Erlene Quan, PA-C at 01/24/2014  8:48 AM      Status: Written Related Problem: Anticoagulated    She reports compliance with Eliquis after she was given samples at her LOV         Anemia - Erlene Quan, PA-C at 01/24/2014  8:50 AM      Status: Written Related Problem: Anemia    She received one unit of blood in the hospital. F/U Hgb in Sept was 10.4. She denies melena.           Erlene Quan, PA-C at 01/24/2014  8:50 AM      Status: Signed                 01/24/2014 Elica Almas Petree   78-Jul-1927   [id number]   Primary Physicia ROBERTS, [name], MD Primary Cardiologist: Dr Claiborne Billings   HPI:  80 y/o female admitted for Rt TKR 10/08/13. Post op she had anemia requiring transfusion, urinary retention, and dehydration. She was discharged to SNF for rehab 10/20/13. On 11/05/13 she was admitted through the ER with anorexia, nausea and vomiting,  confusion, and abdominal pain. She was found to have acute renal failure with a SCr of 7.20 and a K+ of 6.2. This improved with placement of a foley, stopping her ACE, and IV hydration. She was treated for E Coli UTI with IV antibiotics. She was discharged 11/08/13 and admitted again on 11/10/13 with near syncope and tachycardia. She was noted to be in AF with RVR. She was treated with Diltiazem and beta blocker. Her rhythm appeared to A flutter. She was seen in consult 11/11/13. There was concern she had had a stroke but MRI revealed diffuse white matter disease without CVA. During that admission she was felt to have acute diastolic CHF which was treated. Echo showed an EF of 65-70% with grade 2 diastolic dysfunction. She had significant intracavity gradient of 16 mmHg. She was also anemic during her hospitalization and was transfused one unit before discharge. The attending MD described this as chronic  anemia. F/U Hgb on 12/31/13 was stable at 10.4.             She is seen in the office 12/31/13 for cardiac follow up and to discuss elective OP cardioversion. Her brother accompanied her to the office. She was discharged 11/17/13 on Eliquis 2.5 mg BID but did not fill the prescription because of cost. She was also unsure of some of her other medications. She apparently has some mild cognitive impairment. We provided her samples of Eliquis and she is here now for follow up and to arrange for an OP DCCV. She remains in AF. Her rate is still a little fast. She has some LE edema which has been chronic but no CHF. She and her brother report compliance with Eliquis.         Current Outpatient Prescriptions   Medication  Sig  Dispense  Refill   .  anastrozole (ARIMIDEX) 1 MG tablet  Take 1 tablet (1 mg total) by mouth daily.   30 tablet   5   .  apixaban (ELIQUIS) 2.5 MG TABS tablet  Take 1 tablet (2.5 mg total) by mouth 2 (two) times daily.   60 tablet   2   .  diltiazem (CARDIZEM CD) 180 MG 24 hr capsule  Take 1  capsule (180 mg total) by mouth daily.   30 capsule   2   .  furosemide (LASIX) 20 MG tablet  Take 20 mg by mouth daily.         Marland Kitchen  latanoprost (XALATAN) 0.005 % ophthalmic solution  Place 1 drop into both eyes at bedtime.         .  polyethylene glycol (MIRALAX / GLYCOLAX) packet  Take 17 g by mouth daily as needed for mild constipation.   14 each   0   .  tamsulosin (FLOMAX) 0.4 MG CAPS capsule  Take 0.4 mg by mouth daily.         .  traMADol (ULTRAM) 50 MG tablet  Take 1-2 tablets (50-100 mg total) by mouth every 6 (six) hours as needed (mild pain).   60 tablet   1   .  metoprolol (LOPRESSOR) 50 MG tablet  Take 1 tablet (50 mg total) by mouth 2 (two) times daily.   60 tablet   2       No current facility-administered medications for this visit.         Allergies   Allergen  Reactions   .  Milk-Related Compounds  Other (See Comments)       Upset stomach    .  Sulfa Antibiotics  Other (See Comments)       Reaction many years ago-unknown         History       Social History   .  Marital Status:  Single       Spouse Name:  N/A       Number of Children:  N/A   .  Years of Education:  N/A       Occupational History   .  Not on file.       Social History Main Topics   .  Smoking status:  Former Smoker -- 0.50 packs/day for 30 years       Types:  Cigarettes       Quit date:  04/21/1975   .  Smokeless tobacco:  Never Used   .  Alcohol Use:  Yes  Comment: occasionally   .  Drug Use:  No   .  Sexual Activity:  Not Currently       Other Topics  Concern   .  Not on file       Social History Narrative     From SNF, getting PT and using cane.          Review of Systems: General: negative for chills, fever, night sweats or weight changes.   Cardiovascular: negative for chest pain, dyspnea on exertion, edema, orthopnea, palpitations, paroxysmal nocturnal dyspnea or shortness of breath Dermatological: negative for rash Respiratory: negative for cough or  wheezing Urologic: negative for hematuria Abdominal: negative for nausea, vomiting, diarrhea, bright red blood per rectum, melena, or hematemesis Neurologic: negative for visual changes, syncope, or dizziness All other systems reviewed and are otherwise negative except as noted above.       Blood pressure 137/69, pulse 68, height 5\' 6"  (1.676 m), weight 151 lb 4.8 oz (68.629 kg).  General appearance: alert, cooperative and no distress Neck: no carotid bruit and no JVD Lungs: clear to auscultation bilaterally Heart: irregularly irregular rhythm Extremities: 1-2+ bilat LE edema Rt. Lt. Compression stockings in place.  Rectal- Heme negative, brown stool   EKG AF with VR 125   ASSESSMENT AND PLAN:    Atrial fibrillation with RVR She remains in atrial fibrillation. Currently asymptomatic.   Hypertensive cardiovascular disease Nl LVF with severe LVH by echo   Chronic diastolic CHF (congestive heart failure) She has some chronic LE edema but no CHF on exam.    Anticoagulated She reports compliance with Eliquis after she was given samples at her LOV   Anemia She received one unit of blood in the hospital. F/U Hgb in Sept was 10.4. She denies melena. Rectal exam in the office today was negative.    Breast cancer On Arimidex       PLAN  Increase Metoprolol to 75 mg BID. OP DCCV in one week. She has been instructed to avoid salt.    [facility] KPA-C 01/24/2014 8:59 AM         Breast cancer - Erlene Quan, PA-C at 01/24/2014  8:58 AM      Status: Written Related Problem: Breast cancer    On Arimidex    For DCCV. However, patient taking apixaban 2.5 BID; she is 88 but weighs 68 kg and Cr 0.73; GFR 59; she is therefore not adequately anticoagulated; increase apixaban to 5 mg po BID; will ask Dr Kelly/Luke Rosalyn Gess to reschedule 3 weeks after on therapeutic anticoagulation. Kirk Ruths

## 2014-01-29 NOTE — Telephone Encounter (Signed)
Spoke with pt, her eliquis tablets are 2.5 mg. As per dr Stanford Breed the patient was told to take 2 of the 2.5 mg tablets twice daily. Her cardioversion has been rescheduled for later this week. Follow up scheduled

## 2014-01-30 ENCOUNTER — Encounter (HOSPITAL_COMMUNITY): Payer: Self-pay | Admitting: Pharmacy Technician

## 2014-01-30 ENCOUNTER — Telehealth: Payer: Self-pay | Admitting: Cardiology

## 2014-01-30 NOTE — Telephone Encounter (Signed)
Spoke with pt, confirmed DCCV is scheduled for 02-01-14 @ 10 am

## 2014-01-30 NOTE — Telephone Encounter (Signed)
Please call,pt need to know when and where here procedure is this week.

## 2014-02-01 ENCOUNTER — Ambulatory Visit (HOSPITAL_COMMUNITY): Payer: Medicare Other | Admitting: Anesthesiology

## 2014-02-01 ENCOUNTER — Encounter (HOSPITAL_COMMUNITY)
Admission: RE | Disposition: A | Discharge status: Home / Self Care | Payer: Medicare Other | Source: Ambulatory Visit | Attending: Cardiology

## 2014-02-01 ENCOUNTER — Encounter (HOSPITAL_COMMUNITY): Payer: Self-pay | Admitting: Anesthesiology

## 2014-02-01 ENCOUNTER — Ambulatory Visit (HOSPITAL_COMMUNITY)
Admission: RE | Admit: 2014-02-01 | Discharge: 2014-02-01 | Disposition: A | Discharge status: Home / Self Care | Payer: Medicare Other | Source: Ambulatory Visit | Attending: Cardiology | Admitting: Cardiology

## 2014-02-01 DIAGNOSIS — I5032 Chronic diastolic (congestive) heart failure: Secondary | ICD-10-CM | POA: Diagnosis not present

## 2014-02-01 DIAGNOSIS — E079 Disorder of thyroid, unspecified: Secondary | ICD-10-CM | POA: Insufficient documentation

## 2014-02-01 DIAGNOSIS — K219 Gastro-esophageal reflux disease without esophagitis: Secondary | ICD-10-CM | POA: Diagnosis not present

## 2014-02-01 DIAGNOSIS — I4892 Unspecified atrial flutter: Secondary | ICD-10-CM | POA: Insufficient documentation

## 2014-02-01 DIAGNOSIS — M1710 Unilateral primary osteoarthritis, unspecified knee: Secondary | ICD-10-CM | POA: Diagnosis not present

## 2014-02-01 DIAGNOSIS — I4891 Unspecified atrial fibrillation: Secondary | ICD-10-CM | POA: Diagnosis present

## 2014-02-01 DIAGNOSIS — I4819 Other persistent atrial fibrillation: Secondary | ICD-10-CM

## 2014-02-01 DIAGNOSIS — I480 Paroxysmal atrial fibrillation: Secondary | ICD-10-CM | POA: Diagnosis present

## 2014-02-01 DIAGNOSIS — R4781 Slurred speech: Secondary | ICD-10-CM | POA: Insufficient documentation

## 2014-02-01 DIAGNOSIS — E43 Unspecified severe protein-calorie malnutrition: Secondary | ICD-10-CM | POA: Insufficient documentation

## 2014-02-01 DIAGNOSIS — Z853 Personal history of malignant neoplasm of breast: Secondary | ICD-10-CM | POA: Insufficient documentation

## 2014-02-01 DIAGNOSIS — I481 Persistent atrial fibrillation: Secondary | ICD-10-CM

## 2014-02-01 DIAGNOSIS — I110 Hypertensive heart disease with heart failure: Secondary | ICD-10-CM | POA: Insufficient documentation

## 2014-02-01 HISTORY — PX: CARDIOVERSION: SHX1299

## 2014-02-01 HISTORY — PX: TEE WITHOUT CARDIOVERSION: SHX5443

## 2014-02-01 SURGERY — CARDIOVERSION
Anesthesia: General

## 2014-02-01 MED ORDER — PROMETHAZINE HCL 25 MG/ML IJ SOLN: 6.2500 mg | INTRAMUSCULAR | Status: DC | PRN

## 2014-02-01 MED ORDER — SODIUM CHLORIDE 0.9 % IV SOLN
INTRAVENOUS | Status: DC
  Administered 2014-02-01: 09:00:00 via INTRAVENOUS

## 2014-02-01 MED ORDER — FENTANYL CITRATE 0.05 MG/ML IJ SOLN: 25.0000 ug | INTRAMUSCULAR | Status: DC | PRN

## 2014-02-01 MED ORDER — MEPERIDINE HCL 100 MG/ML IJ SOLN: 6.2500 mg | INTRAMUSCULAR | Status: DC | PRN

## 2014-02-01 MED ORDER — PROPOFOL 10 MG/ML IV BOLUS
INTRAVENOUS | Status: DC | PRN
  Administered 2014-02-01: 30 mg via INTRAVENOUS
  Administered 2014-02-01: 20 mg via INTRAVENOUS

## 2014-02-01 MED ORDER — SODIUM CHLORIDE 0.9 % IV SOLN
INTRAVENOUS | Status: DC | PRN
  Administered 2014-02-01: 10:00:00 via INTRAVENOUS

## 2014-02-01 MED ORDER — PROPOFOL INFUSION 10 MG/ML OPTIME
INTRAVENOUS | Status: DC | PRN
  Administered 2014-02-01: 50 ug/kg/min via INTRAVENOUS

## 2014-02-01 MED ORDER — BUTAMBEN-TETRACAINE-BENZOCAINE 2-2-14 % EX AERO
INHALATION_SPRAY | CUTANEOUS | Status: DC | PRN
  Administered 2014-02-01: 1 via TOPICAL

## 2014-02-01 NOTE — Transfer of Care (Signed)
Immediate Anesthesia Transfer of Care Note  Patient: Natasha Fletcher  Procedure(s) Performed: Procedure(s): CARDIOVERSION (N/A) TRANSESOPHAGEAL ECHOCARDIOGRAM (TEE) (N/A)  Patient Location: PACU and Endoscopy Unit  Anesthesia Type:General  Level of Consciousness: awake, alert , oriented and sedated  Airway & Oxygen Therapy: Patient Spontanous Breathing and Patient connected to nasal cannula oxygen  Post-op Assessment: Report given to PACU RN, Post -op Vital signs reviewed and stable and Patient moving all extremities  Post vital signs: Reviewed and stable  Complications: No apparent anesthesia complications

## 2014-02-01 NOTE — Anesthesia Postprocedure Evaluation (Signed)
  Anesthesia Post-op Note  Patient: Natasha Fletcher  Procedure(s) Performed: Procedure(s): CARDIOVERSION (N/A) TRANSESOPHAGEAL ECHOCARDIOGRAM (TEE) (N/A)  Patient Location: PACU and Endoscopy Unit  Anesthesia Type:General  Level of Consciousness: awake and alert   Airway and Oxygen Therapy: Patient Spontanous Breathing  Post-op Pain: none  Post-op Assessment: Post-op Vital signs reviewed, Patient's Cardiovascular Status Stable and Respiratory Function Stable  Post-op Vital Signs: Reviewed and stable  Last Vitals:  Filed Vitals:   02/01/14 1150  BP: 170/82  Pulse: 106  Temp:   Resp: 16    Complications: No apparent anesthesia complications

## 2014-02-01 NOTE — Progress Notes (Signed)
Echocardiogram Echocardiogram Transesophageal has been performed.  Joelene Millin 02/01/2014, 10:58 AM

## 2014-02-01 NOTE — H&P (View-Only) (Signed)
Patient's TEE/DCCV has been scheduled for 02/01/14 at 10:00 AM with Dr Marlou Porch; pt instructed to be in endoscopy at 8:30 AM; instructed to increase apixaban to 5 mg po BID; increase lasix to 40 mg daily; will arrange FU in office next week. Natasha Fletcher

## 2014-02-01 NOTE — CV Procedure (Signed)
    Electrical Cardioversion Procedure Note Natasha Fletcher [id number] Dec 08, 1925  Procedure: Electrical Cardioversion Indications:  Atrial Fibrillation  Time Out: Verified patient identification, verified procedure,medications/allergies/relevent history reviewed, required imaging and test results available.  Performed  Procedure Details  The patient was NPO after midnight. Anesthesia was administered at the beside  by Dr. Tresa Moore with propofol.  Cardioversion was performed with synchronized biphasic defibrillation via AP pads with 150 joules.  1 attempt(s) were performed.  The patient converted to normal sinus rhythm. The patient tolerated the procedure well   IMPRESSION:  Successful cardioversion of atrial fibrillation TEE with no LA thrombus Moderate TR Sinus bradycardia post procedure 48bpm. Will stop diltiazem cd 180 and continue metoprolol 75 bid. In follow up check ECG, if bradycardic, decrease metoprolol.   Natasha Furbish, MD   Natasha Fletcher, [place] 02/01/2014, 10:45 AM

## 2014-02-01 NOTE — Interval H&P Note (Signed)
History and Physical Interval Note: Spoke to her brother.   02/01/2014 10:25 AM  Aneita B Espaillat  has presented today for surgery, with the diagnosis of afib  The various methods of treatment have been discussed with the patient and family. After consideration of risks, benefits and other options for treatment, the patient has consented to  Procedure(s): CARDIOVERSION (N/A) TRANSESOPHAGEAL ECHOCARDIOGRAM (TEE) (N/A) as a surgical intervention .  The patient's history has been reviewed, patient examined, no change in status, stable for surgery.  I have reviewed the patient's chart and labs.  Questions were answered to the patient's satisfaction.     Porscha Axley

## 2014-02-01 NOTE — Anesthesia Preprocedure Evaluation (Addendum)
Anesthesia Evaluation  Patient identified by MRN, date of birth, ID band Patient awake    Reviewed: Allergy & Precautions, H&P , NPO status , Patient's Chart, lab work & pertinent test results  Airway Mallampati: II       Dental  (+) Edentulous Upper, Edentulous Lower   Pulmonary former smoker (quit 1977 15 pack years),  breath sounds clear to auscultation        Cardiovascular hypertension, Pt. on medications Rhythm:Irregular  ECHO 01/2014 EF 65%, severe concentric LVH, probable infiltrative cardiomyopathy    Neuro/Psych    GI/Hepatic GERD-  Controlled,  Endo/Other    Renal/GU      Musculoskeletal   Abdominal (+)  Abdomen: soft.    Peds  Hematology  (+) anemia , H/H 10/34   Anesthesia Other Findings   Reproductive/Obstetrics                           Anesthesia Physical Anesthesia Plan  ASA: III  Anesthesia Plan: General   Post-op Pain Management:    Induction: Intravenous  Airway Management Planned: Nasal Cannula  Additional Equipment:   Intra-op Plan:   Post-operative Plan:   Informed Consent: I have reviewed the patients History and Physical, chart, labs and discussed the procedure including the risks, benefits and alternatives for the proposed anesthesia with the patient or authorized representative who has indicated his/her understanding and acceptance.     Plan Discussed with:   Anesthesia Plan Comments:         Anesthesia Quick Evaluation

## 2014-02-04 ENCOUNTER — Encounter (HOSPITAL_COMMUNITY): Payer: Self-pay | Admitting: Cardiology

## 2014-02-11 ENCOUNTER — Encounter: Payer: Self-pay | Admitting: Cardiology

## 2014-02-11 ENCOUNTER — Ambulatory Visit (INDEPENDENT_AMBULATORY_CARE_PROVIDER_SITE_OTHER): Payer: Medicare Other | Admitting: Cardiology

## 2014-02-11 VITALS — BP 110/60 | HR 58 | Ht 66.0 in | Wt 140.8 lb

## 2014-02-11 DIAGNOSIS — I10 Essential (primary) hypertension: Secondary | ICD-10-CM

## 2014-02-11 DIAGNOSIS — Z7901 Long term (current) use of anticoagulants: Secondary | ICD-10-CM

## 2014-02-11 DIAGNOSIS — I4891 Unspecified atrial fibrillation: Secondary | ICD-10-CM

## 2014-02-11 NOTE — Patient Instructions (Signed)
Your physician recommends that you schedule a follow-up appointment in: 1 month with [facility].

## 2014-02-11 NOTE — Assessment & Plan Note (Signed)
Now in NSR/SB on Eliquis and Lopressor 50 mg BID. She feels better, more energy.

## 2014-02-11 NOTE — Assessment & Plan Note (Signed)
Controlled.  

## 2014-02-11 NOTE — Progress Notes (Signed)
02/11/2014 Natasha Fletcher   Jun 07, 1925  [id number]  Primary Physician Natasha Jacobson, MD Primary Cardiologist: Dr Natasha Fletcher  HPI:  80 y/o female admitted for Rt TKR 10/08/13. Post op she had anemia requiring transfusion, urinary retention, and dehydration. She was discharged to SNF for rehab 10/20/13. On 11/05/13 she was admitted through the ER with acute renal failure with a SCr of 7.20 and a K+ of 6.2. This improved with placement of a foley, stopping her ACE, and IV hydration. She was treated for E Coli UTI with IV antibiotics. She was discharged 11/08/13 and admitted again on 11/10/13 with near syncope and tachycardia. She was noted to be in AF with RVR. She was treated with Diltiazem and beta blocker. Her rhythm appeared to A flutter. We saw her in consult 11/11/13.  Echo showed an EF of 65-70% with grade 2 diastolic dysfunction. She had significant intracavity gradient of 16 mmHg.   She is seen in the office 12/31/13 for cardiac follow up and to discuss elective OP cardioversion. She was discharged 11/17/13 on Eliquis 2.5 mg BID but did not fill the prescription because of cost. It was determined that she should actually be on 5 mg BID. She was given samples but when she presented for DCCV 01/29/14 it could not be confirmed she was taking this dose. Her DCCV was rescheduled for 02/01/14 and was successful. She is in the office today for follow up. Her brother accompanied her today as he has on previous visits. The pt says she is doing well. She notes less DOE. She denies any near syncope or weakness.    Current Outpatient Prescriptions  Medication Sig Dispense Refill  . anastrozole (ARIMIDEX) 1 MG tablet Take 1 tablet (1 mg total) by mouth daily. 30 tablet 5  . apixaban (ELIQUIS) 5 MG TABS tablet Take 1 tablet (5 mg total) by mouth 2 (two) times daily. 60 tablet 2  . furosemide (LASIX) 20 MG tablet Take 40 mg by mouth daily.     Marland Kitchen latanoprost (XALATAN) 0.005 % ophthalmic solution Place 1  drop into both eyes at bedtime.    . metoprolol (LOPRESSOR) 50 MG tablet Take 1.5 tablets (75 mg total) by mouth 2 (two) times daily. 90 tablet 6  . tamsulosin (FLOMAX) 0.4 MG CAPS capsule Take 0.4 mg by mouth daily.     No current facility-administered medications for this visit.    Allergies  Allergen Reactions  . Milk-Related Compounds Other (See Comments)    Upset stomach   . Sulfa Antibiotics Other (See Comments)    Reaction many years ago-unknown    History   Social History  . Marital Status: Single    Spouse Name: N/A    Number of Children: N/A  . Years of Education: N/A   Occupational History  . Not on file.   Social History Main Topics  . Smoking status: Former Smoker -- 0.50 packs/day for 30 years    Types: Cigarettes    Quit date: 04/21/1975  . Smokeless tobacco: Never Used  . Alcohol Use: Yes     Comment: occasionally  . Drug Use: No  . Sexual Activity: Not Currently   Other Topics Concern  . Not on file   Social History Narrative   From SNF, getting PT and using cane.       Review of Systems: General: negative for chills, fever, night sweats or weight changes.  Cardiovascular: negative for chest pain, dyspnea on exertion, edema, orthopnea, palpitations, paroxysmal  nocturnal dyspnea or shortness of breath Dermatological: negative for rash Respiratory: negative for cough or wheezing Urologic: negative for hematuria Abdominal: negative for nausea, vomiting, diarrhea, bright red blood per rectum, melena, or hematemesis Neurologic: negative for visual changes, syncope, or dizziness All other systems reviewed and are otherwise negative except as noted above.    Blood pressure 110/60, pulse 58, height 5\' 6"  (1.676 m), weight 140 lb 12.8 oz (63.866 kg).  General appearance: alert, cooperative and no distress Lungs: clear to auscultation bilaterally Heart: regular rate and rhythm  EKG NSR, SB-58  ASSESSMENT AND PLAN:   Atrial fibrillation with  RVR Now in NSR/SB on Eliquis and Lopressor 50 mg BID. She feels better, more energy.  Hypertension Controlled  Chronic anticoagulation Eliquis 5 mg BID   PLAN  She is doing well on her current medications. Her HR is a little slow but she seems asymptomatic. I will check her in a month and if she continues to do well she can follow up in 6 months. If she has any fatigue or weakness I would decrease her Metoprolol to 25 mg BID.   Katiejo Gilroy KPA-C 02/11/2014 9:00 AM

## 2014-02-11 NOTE — Assessment & Plan Note (Signed)
Eliquis 5 mg BID 

## 2014-02-27 ENCOUNTER — Other Ambulatory Visit: Payer: Self-pay | Admitting: Adult Health

## 2014-03-08 ENCOUNTER — Telehealth: Payer: Self-pay | Admitting: Cardiology

## 2014-03-08 NOTE — Telephone Encounter (Signed)
Spoke with deem shelton, aware samples at the front desk for pick up.

## 2014-03-08 NOTE — Telephone Encounter (Signed)
Natasha Fletcher called in to see if any samples of Eliquis are available for the pt. Please  Call  Thanks

## 2014-03-18 ENCOUNTER — Other Ambulatory Visit: Payer: Self-pay | Admitting: Adult Health

## 2014-03-21 ENCOUNTER — Encounter: Payer: Self-pay | Admitting: Cardiology

## 2014-03-22 ENCOUNTER — Ambulatory Visit: Payer: Medicare Other | Admitting: Cardiology

## 2014-03-28 ENCOUNTER — Other Ambulatory Visit: Payer: Self-pay | Admitting: Adult Health

## 2014-04-02 ENCOUNTER — Telehealth (INDEPENDENT_AMBULATORY_CARE_PROVIDER_SITE_OTHER): Payer: Self-pay

## 2014-04-02 NOTE — Telephone Encounter (Signed)
Medical oncology

## 2014-04-02 NOTE — Telephone Encounter (Signed)
Breast cancer patient last seen in f/u by Dr. Excell Seltzer on 03/13/14.  She is requesting a refill of Anastrozole 1mg .  Please advise.  Message also in [employer].

## 2014-04-03 ENCOUNTER — Telehealth: Payer: Self-pay | Admitting: *Deleted

## 2014-04-03 MED ORDER — ANASTROZOLE 1 MG PO TABS: 1.0000 mg | ORAL_TABLET | Freq: Every day | ORAL | Status: DC

## 2014-04-03 NOTE — Telephone Encounter (Signed)
Called by [facility] that [facility] requesting refill on her Arimidex from them and Dr. Excell Seltzer suggested medical oncology follow this. Unsure if she has a follow up appointment at [facility]. Notified Nevaeha that her Arimidex was being sent [facility]. Reminded her of her follow up with Dr. Lindi Adie on 04/10/14 at 1:30/2:00. She was not aware of the appointment, but wrote down date/time and was able to repeat it back to nurse.

## 2014-04-10 ENCOUNTER — Other Ambulatory Visit: Payer: Medicare Other

## 2014-04-10 ENCOUNTER — Ambulatory Visit: Payer: Medicare Other | Admitting: Hematology and Oncology

## 2014-04-10 NOTE — Assessment & Plan Note (Signed)
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  Arimidex toxicities: 1. Worsening joint pains  Breast cancer surveillance: 1. Mammogram 05/03/2013 was normal 2. Breast exam 04/10/2014 is normal  Return to clinic in 1 year for follow-up

## 2014-04-11 ENCOUNTER — Telehealth: Payer: Self-pay | Admitting: Hematology and Oncology

## 2014-04-11 ENCOUNTER — Other Ambulatory Visit: Payer: Self-pay

## 2014-04-11 NOTE — Telephone Encounter (Signed)
, °

## 2014-04-23 ENCOUNTER — Other Ambulatory Visit: Payer: Self-pay

## 2014-04-23 ENCOUNTER — Ambulatory Visit: Payer: Self-pay | Admitting: Hematology and Oncology

## 2014-04-30 ENCOUNTER — Telehealth: Payer: Self-pay | Admitting: Cardiovascular Disease

## 2014-04-30 MED ORDER — APIXABAN 5 MG PO TABS: 5.0000 mg | ORAL_TABLET | Freq: Two times a day (BID) | ORAL | Status: DC

## 2014-04-30 NOTE — Telephone Encounter (Signed)
Pt would like some samples of Eliquis please. °

## 2014-04-30 NOTE — Telephone Encounter (Signed)
Samples of this drug were given to the patient, quantity 42, Lot Number [id number]  Patient notified.

## 2014-05-02 ENCOUNTER — Telehealth: Payer: Self-pay | Admitting: Hematology and Oncology

## 2014-05-02 NOTE — Telephone Encounter (Signed)
Patient rescheduled missed appointment 01/19 to 02/23. Patient confirm

## 2014-05-08 ENCOUNTER — Other Ambulatory Visit: Payer: Self-pay | Admitting: Hematology and Oncology

## 2014-05-28 ENCOUNTER — Telehealth: Payer: Self-pay | Admitting: Hematology and Oncology

## 2014-05-28 ENCOUNTER — Other Ambulatory Visit (HOSPITAL_BASED_OUTPATIENT_CLINIC_OR_DEPARTMENT_OTHER): Payer: Medicare Other

## 2014-05-28 ENCOUNTER — Ambulatory Visit (HOSPITAL_BASED_OUTPATIENT_CLINIC_OR_DEPARTMENT_OTHER): Payer: BLUE CROSS/BLUE SHIELD | Admitting: Hematology and Oncology

## 2014-05-28 VITALS — BP 156/58 | HR 80 | Temp 97.7°F | Resp 18 | Ht 66.0 in | Wt 149.6 lb

## 2014-05-28 DIAGNOSIS — C50211 Malignant neoplasm of upper-inner quadrant of right female breast: Secondary | ICD-10-CM | POA: Diagnosis not present

## 2014-05-28 DIAGNOSIS — C50119 Malignant neoplasm of central portion of unspecified female breast: Secondary | ICD-10-CM

## 2014-05-28 DIAGNOSIS — Z170 Estrogen receptor positive status [ER+]: Secondary | ICD-10-CM

## 2014-05-28 LAB — COMPREHENSIVE METABOLIC PANEL (CC13)
ALBUMIN: 3.7 g/dL (ref 3.5–5.0)
ALT: 24 U/L (ref 0–55)
AST: 28 U/L (ref 5–34)
Alkaline Phosphatase: 113 U/L (ref 40–150)
Anion Gap: 9 mEq/L (ref 3–11)
BUN: 20.4 mg/dL (ref 7.0–26.0)
CALCIUM: 9.5 mg/dL (ref 8.4–10.4)
CO2: 29 meq/L (ref 22–29)
Chloride: 108 mEq/L (ref 98–109)
Creatinine: 1 mg/dL (ref 0.6–1.1)
EGFR: 62 mL/min/{1.73_m2} — AB (ref >90)
GLUCOSE: 78 mg/dL (ref 70–140)
Potassium: 4 mEq/L (ref 3.5–5.1)
Sodium: 145 mEq/L (ref 136–145)
TOTAL PROTEIN: 7 g/dL (ref 6.4–8.3)
Total Bilirubin: 0.72 mg/dL (ref 0.20–1.20)

## 2014-05-28 LAB — CBC WITH DIFFERENTIAL/PLATELET
BASO%: 0.6 % (ref 0.0–2.0)
Basophils Absolute: 0 10*3/uL (ref 0.0–0.1)
EOS%: 2.9 % (ref 0.0–7.0)
Eosinophils Absolute: 0.2 10*3/uL (ref 0.0–0.5)
HCT: 36.3 % (ref 34.8–46.6)
HGB: 11.5 g/dL — ABNORMAL LOW (ref 11.6–15.9)
LYMPH#: 1.4 10*3/uL (ref 0.9–3.3)
LYMPH%: 26.3 % (ref 14.0–49.7)
MCH: 27.1 pg (ref 25.1–34.0)
MCHC: 31.7 g/dL (ref 31.5–36.0)
MCV: 85.4 fL (ref 79.5–101.0)
MONO#: 0.4 10*3/uL (ref 0.1–0.9)
MONO%: 6.7 % (ref 0.0–14.0)
NEUT%: 63.5 % (ref 38.4–76.8)
NEUTROS ABS: 3.3 10*3/uL (ref 1.5–6.5)
Platelets: 217 10*3/uL (ref 145–400)
RBC: 4.25 10*6/uL (ref 3.70–5.45)
RDW: 14.8 % — AB (ref 11.2–14.5)
WBC: 5.2 10*3/uL (ref 3.9–10.3)

## 2014-05-28 NOTE — Assessment & Plan Note (Signed)
Right breast lumpectomy for a 0.9 cm ER positive PR positive HER-2/neu negative invasive ductal carcinoma of the right breast on 04/27/2011   Current treatment: Arimidex 1 mg daily since February 2013 with the plan for 5 years Arimidex toxicities: No significant problems Breast cancer surveillance:  1. Mammogram 05/03/2013 is normal, patient will need another mammogram this year if she wishes to continue with surveillance 2. Breast exam 05/28/2014 is normal  Return to clinic in 1 year for follow-up

## 2014-05-28 NOTE — Telephone Encounter (Signed)
per pof to sch pt appt-gave pt copy of sch °

## 2014-05-28 NOTE — Progress Notes (Signed)
Patient Care Team: Lorene Dy, MD as PCP - General (Internal Medicine)  DIAGNOSIS: Cancer of central portion of female breast   Staging form: Breast, AJCC 7th Edition     Clinical: No stage assigned - Unsigned     Pathologic: Stage 0 (Tis, NX, cM0) - Unsigned   SUMMARY OF ONCOLOGIC HISTORY:   Breast cancer of upper-inner quadrant of right female breast   03/24/2011 Initial Diagnosis Right breast biopsy: Invasive ductal carcinoma, ER 100%, P 100%, Ki-67 8%, HER-2 negative ratio 1.42   04/27/2011 Surgery Right breast lumpectomy: 0.9 cm invasive ductal carcinoma grade 1 with low-grade DCIS ER/PR positive HER-2 negative   05/12/2011 -  Anti-estrogen oral therapy Arimidex 1 mg daily    CHIEF COMPLIANT: Follow-up on Arimidex  INTERVAL HISTORY: Natasha Fletcher is a 81 year old lady with a right breast cancer treated with lumpectomy and is on Arimidex 1 mg daily since February 2013. She reports no major problems and is tolerating it fairly well. She has multiple osteoarthritic symptoms and has undergone bilateral knee replacements. Other than the musculoskeletal pains she has also had issues with her heart in her bladder.  REVIEW OF SYSTEMS:   Constitutional: Denies fevers, chills or abnormal weight loss Eyes: Denies blurriness of vision Ears, nose, mouth, throat, and face: Denies mucositis or sore throat Respiratory: Denies cough, dyspnea or wheezes Cardiovascular: Denies palpitation, chest discomfort or lower extremity swelling Gastrointestinal:  Denies nausea, heartburn or change in bowel habits Skin: Denies abnormal skin rashes Lymphatics: Denies new lymphadenopathy or easy bruising Neurological:Denies numbness, tingling or new weaknesses Behavioral/Psych: Mood is stable, no new changes  All other systems were reviewed with the patient and are negative.  I have reviewed the past medical history, past surgical history, social history and family history with the patient and they are  unchanged from previous note.  ALLERGIES:  is allergic to milk-related compounds and sulfa antibiotics.  MEDICATIONS:  Current Outpatient Prescriptions  Medication Sig Dispense Refill  . anastrozole (ARIMIDEX) 1 MG tablet TAKE 1 TABLET (1 MG TOTAL) BY MOUTH DAILY. 30 tablet 0  . apixaban (ELIQUIS) 5 MG TABS tablet Take 1 tablet (5 mg total) by mouth 2 (two) times daily. 60 tablet 0  . HYDROcodone-acetaminophen (NORCO/VICODIN) 5-325 MG per tablet Take 1 tablet by mouth every 8 (eight) hours as needed. for pain  0  . latanoprost (XALATAN) 0.005 % ophthalmic solution Place 1 drop into both eyes at bedtime.    . metoprolol (LOPRESSOR) 50 MG tablet Take 1.5 tablets (75 mg total) by mouth 2 (two) times daily. 90 tablet 6  . tamsulosin (FLOMAX) 0.4 MG CAPS capsule Take 0.4 mg by mouth daily.    . traMADol (ULTRAM) 50 MG tablet Take 50 mg by mouth 2 (two) times daily.  2  . furosemide (LASIX) 20 MG tablet Take 40 mg by mouth daily.      No current facility-administered medications for this visit.    PHYSICAL EXAMINATION: ECOG PERFORMANCE STATUS: 1 - Symptomatic but completely ambulatory  Filed Vitals:   05/28/14 0923  BP: 156/58  Pulse: 80  Temp: 97.7 F (36.5 C)  Resp: 18   Filed Weights   05/28/14 0923  Weight: 149 lb 9.6 oz (67.858 kg)    GENERAL:alert, no distress and comfortable SKIN: skin color, texture, turgor are normal, no rashes or significant lesions EYES: normal, Conjunctiva are pink and non-injected, sclera clear OROPHARYNX:no exudate, no erythema and lips, buccal mucosa, and tongue normal  NECK: supple, thyroid normal size, non-tender,  without nodularity LYMPH:  no palpable lymphadenopathy in the cervical, axillary or inguinal LUNGS: clear to auscultation and percussion with normal breathing effort HEART: regular rate & rhythm and no murmurs and no lower extremity edema ABDOMEN:abdomen soft, non-tender and normal bowel sounds Musculoskeletal:no cyanosis of digits and  no clubbing  NEURO: alert & oriented x 3 with fluent speech, no focal motor/sensory deficits BREAST: No palpable masses or nodules in either right or left breasts. No palpable axillary supraclavicular or infraclavicular adenopathy no breast tenderness or nipple discharge. (exam performed in the presence of a chaperone)  LABORATORY DATA:  I have reviewed the data as listed   Chemistry      Component Value Date/Time   NA 145 05/28/2014 0903   NA 139 01/24/2014 1050   K 4.0 05/28/2014 0903   K 3.9 01/24/2014 1050   CL 103 01/24/2014 1050   CL 106 06/30/2012 1408   CO2 29 05/28/2014 0903   CO2 24 01/24/2014 1050   BUN 20.4 05/28/2014 0903   BUN 11 01/24/2014 1050   CREATININE 1.0 05/28/2014 0903   CREATININE 0.73 01/24/2014 1050   CREATININE 0.90 11/17/2013 0442      Component Value Date/Time   CALCIUM 9.5 05/28/2014 0903   CALCIUM 9.4 01/24/2014 1050   ALKPHOS 113 05/28/2014 0903   ALKPHOS 79 01/24/2014 1050   AST 28 05/28/2014 0903   AST 37 01/24/2014 1050   ALT 24 05/28/2014 0903   ALT 30 01/24/2014 1050   BILITOT 0.72 05/28/2014 0903   BILITOT 1.2 01/24/2014 1050       Lab Results  Component Value Date   WBC 5.2 05/28/2014   HGB 11.5* 05/28/2014   HCT 36.3 05/28/2014   MCV 85.4 05/28/2014   PLT 217 05/28/2014   NEUTROABS 3.3 05/28/2014    ASSESSMENT & PLAN:  Breast cancer of upper-inner quadrant of right female breast Right breast lumpectomy for a 0.9 cm ER positive PR positive HER-2/neu negative invasive ductal carcinoma of the right breast on 04/27/2011   Current treatment: Arimidex 1 mg daily since February 2013 with the plan for 5 years Arimidex toxicities: No significant problems Breast cancer surveillance:  1. Mammogram 05/03/2013 is normal, patient will need another mammogram this year if she wishes to continue with surveillance 2. Breast exam 05/28/2014 is normal  Return to clinic in 1 year for follow-up    No orders of the defined types were  placed in this encounter.   The patient has a good understanding of the overall plan. she agrees with it. She will call with any problems that may develop before her next visit here.   Rulon Eisenmenger, MD

## 2014-05-30 ENCOUNTER — Other Ambulatory Visit: Payer: Self-pay | Admitting: *Deleted

## 2014-05-30 ENCOUNTER — Telehealth: Payer: Self-pay | Admitting: *Deleted

## 2014-05-30 MED ORDER — APIXABAN 5 MG PO TABS: 5.0000 mg | ORAL_TABLET | Freq: Two times a day (BID) | ORAL | Status: DC

## 2014-05-30 NOTE — Telephone Encounter (Signed)
Patient walked in and requested samples of eliquis. Made aware we have no samples at this time. Script sent to the pharmacy and savings card given to patient.

## 2014-05-31 ENCOUNTER — Telehealth: Payer: Self-pay | Admitting: Cardiology

## 2014-05-31 NOTE — Telephone Encounter (Signed)
Received a call back from patient.Lurena Joiner advised will need appointment with Erasmo Downer our pharmacist to change eliquis to coumadin..Schedulers will call back with appointment.

## 2014-05-31 NOTE — Telephone Encounter (Signed)
Please arrange for this pt to be seen by Erasmo Downer in the Coumadin clinic to transition to Warfarin.  Kerin Ransom PA-C 05/31/2014 1:14 PM

## 2014-05-31 NOTE — Telephone Encounter (Signed)
Natasha Fletcher called to get samples of eliquis placed them up front

## 2014-05-31 NOTE — Telephone Encounter (Signed)
wrong provider,pt meant to say Kerin Ransom.

## 2014-05-31 NOTE — Telephone Encounter (Signed)
Pt wants to know what can she take in the place of Eliquis. She says she can not afford it.

## 2014-05-31 NOTE — Telephone Encounter (Signed)
Returned call to patient no answer.LMTC. 

## 2014-05-31 NOTE — Telephone Encounter (Signed)
Returned call to patient she stated she cannot afford Eliquis 5 mg.Samples left at front desk of [facility] office.Message sent to [facility] PA for order on different medication.

## 2014-06-02 ENCOUNTER — Encounter (HOSPITAL_COMMUNITY): Payer: Self-pay | Admitting: Emergency Medicine

## 2014-06-02 ENCOUNTER — Emergency Department (HOSPITAL_COMMUNITY)
Admission: EM | Admit: 2014-06-02 | Discharge: 2014-06-02 | Disposition: A | Discharge status: Home / Self Care | Payer: Medicare Other | Attending: Emergency Medicine | Admitting: Emergency Medicine

## 2014-06-02 DIAGNOSIS — Z853 Personal history of malignant neoplasm of breast: Secondary | ICD-10-CM | POA: Diagnosis not present

## 2014-06-02 DIAGNOSIS — I10 Essential (primary) hypertension: Secondary | ICD-10-CM | POA: Diagnosis not present

## 2014-06-02 DIAGNOSIS — H409 Unspecified glaucoma: Secondary | ICD-10-CM | POA: Diagnosis not present

## 2014-06-02 DIAGNOSIS — R300 Dysuria: Secondary | ICD-10-CM | POA: Diagnosis present

## 2014-06-02 DIAGNOSIS — Z87891 Personal history of nicotine dependence: Secondary | ICD-10-CM | POA: Diagnosis not present

## 2014-06-02 DIAGNOSIS — Z96659 Presence of unspecified artificial knee joint: Secondary | ICD-10-CM | POA: Insufficient documentation

## 2014-06-02 DIAGNOSIS — J45909 Unspecified asthma, uncomplicated: Secondary | ICD-10-CM | POA: Insufficient documentation

## 2014-06-02 DIAGNOSIS — Z8639 Personal history of other endocrine, nutritional and metabolic disease: Secondary | ICD-10-CM | POA: Diagnosis not present

## 2014-06-02 DIAGNOSIS — M1990 Unspecified osteoarthritis, unspecified site: Secondary | ICD-10-CM | POA: Insufficient documentation

## 2014-06-02 DIAGNOSIS — Z98811 Dental restoration status: Secondary | ICD-10-CM | POA: Diagnosis not present

## 2014-06-02 DIAGNOSIS — Z90710 Acquired absence of both cervix and uterus: Secondary | ICD-10-CM | POA: Diagnosis not present

## 2014-06-02 DIAGNOSIS — Z7901 Long term (current) use of anticoagulants: Secondary | ICD-10-CM | POA: Insufficient documentation

## 2014-06-02 DIAGNOSIS — Z79899 Other long term (current) drug therapy: Secondary | ICD-10-CM | POA: Insufficient documentation

## 2014-06-02 DIAGNOSIS — Z8701 Personal history of pneumonia (recurrent): Secondary | ICD-10-CM | POA: Insufficient documentation

## 2014-06-02 DIAGNOSIS — Z8719 Personal history of other diseases of the digestive system: Secondary | ICD-10-CM | POA: Insufficient documentation

## 2014-06-02 DIAGNOSIS — N390 Urinary tract infection, site not specified: Secondary | ICD-10-CM

## 2014-06-02 LAB — I-STAT CHEM 8, ED
BUN: 20 mg/dL (ref 6–23)
CHLORIDE: 105 mmol/L (ref 96–112)
CREATININE: 0.8 mg/dL (ref 0.50–1.10)
Calcium, Ion: 1.2 mmol/L (ref 1.13–1.30)
Glucose, Bld: 95 mg/dL (ref 70–99)
HEMATOCRIT: 35 % — AB (ref 36.0–46.0)
HEMOGLOBIN: 11.9 g/dL — AB (ref 12.0–15.0)
POTASSIUM: 4 mmol/L (ref 3.5–5.1)
Sodium: 141 mmol/L (ref 135–145)
TCO2: 22 mmol/L (ref 0–100)

## 2014-06-02 LAB — URINALYSIS, ROUTINE W REFLEX MICROSCOPIC
BILIRUBIN URINE: NEGATIVE
GLUCOSE, UA: NEGATIVE mg/dL
Ketones, ur: NEGATIVE mg/dL
Nitrite: POSITIVE — AB
Protein, ur: 30 mg/dL — AB
Specific Gravity, Urine: 1.009 (ref 1.005–1.030)
Urobilinogen, UA: 0.2 mg/dL (ref 0.0–1.0)
pH: 7 (ref 5.0–8.0)

## 2014-06-02 LAB — URINE MICROSCOPIC-ADD ON

## 2014-06-02 MED ORDER — CEPHALEXIN 500 MG PO CAPS: 500.0000 mg | ORAL_CAPSULE | Freq: Four times a day (QID) | ORAL | Status: DC

## 2014-06-02 NOTE — Discharge Instructions (Signed)
1. Medications: keflex, usual home medications 2. Treatment: rest, drink plenty of fluids, take medications as prescribed 3. Follow Up: Please followup with your urologist in 3 days for discussion of your diagnoses and further evaluation after today's visit; if you do not have a primary care doctor use the resource guide provided to find one; return to the ER for fevers, persistent vomiting, worsening abdominal pain or other concerning symptoms.    Catheter-Associated Urinary Tract Infection FAQs WHAT IS "CATHETER-ASSOCIATED" URINARY TRACT INFECTION? A urinary tract infection (also called "UTI") is an infection in the urinary system, which includes the bladder (which stores the urine) and the kidneys (which filter the blood to make urine). Germs (for example, bacteria or yeasts) do not normally live in these areas; but if germs are introduced, an infection can occur. If you have a urinary catheter, germs can travel along the catheter and cause an infection in your bladder or your kidney; in that case it is called a catheter-associated urinary tract infection (or "CA-UTI").  WHAT IS A URINARY CATHETER? A urinary catheter is a thin tube placed in the bladder to drain urine. Urine drains through the tube into a bag that collects the urine. A urinary  catheter may be used:  If you are not able to urinate on your own.  To measure the amount of urine that you make, for example, during intensive care.  During and after some types of surgery.  During some tests of the kidneys and bladder . People with urinary catheters have a much higher chance of getting a urinary tract infection than people who don't have a catheter. HOW DO I GET A CATHETER-ASSOCIATED URINARY TRACT INFECTION (CA-UTI)? If germs enter the urinary tract, they may cause an infection. Many of the germs that cause a catheter-associated urinary tract infection are common germs found in your intestines that do not usually cause an infection  there. Germs can enter the urinary tract when the catheter is being put in or while the catheter remains in the bladder.  WHAT ARE THE SYMPTOMS OF A URINARY TRACT INFECTION?  Some of the common symptoms of a urinary tract infection are:  Burning or pain in the lower abdomen (that is, below the stomach).  Fever.  Bloody urine may be a sign of infection, but is also caused by other problems .  Burning during urination or an increase in the frequency of urination after the catheter is removed. Sometimes people with catheter-associated urinary tract infections do not have these symptoms of infection. CAN CATHETER-ASSOCIATED URINARY TRACT INFECTIONS BE TREATED? Yes, most catheter-associated urinary tract infections can be treated with antibiotics and removal or change of the catheter. Your doctor will determine which antibiotic is best for you.  WHAT ARE SOME OF THE THINGS THAT HOSPITALS ARE DOING TO PREVENT CATHETER-ASSOCIATED URINARY TRACT INFECTIONS? To prevent urinary tract infections, doctors and nurses take the following actions.  Catheter insertion  Catheters are put in only when necessary and they are removed as soon as possible.  Only properly trained persons insert catheters using sterile ("clean") technique.  The skin in the area where the catheter will be inserted is cleaned before inserting the catheter.  Other methods to drain the urine are sometimes used, such as:  External catheters in men (these look like condoms and are placed over the penis rather than into the penis)  Putting a temporary catheter in to drain the urine and removing it right away. This is called intermittent urethral catheterization. Catheter care  Healthcare providers clean their hands by washing them with soap and water or using an alcohol-based hand rub before and after touching your catheter.  If you do not see your providers clean their hands, please ask them to do so.  Avoid disconnecting the  catheter and drain tube. This helps to prevent germs from getting into the catheter tube.  The catheter is secured to the leg to prevent pulling on the catheter.  Avoid twisting or kinking the catheter.  Keep the bag lower than the bladder to prevent urine from backflowing to the bladder.  Empty the bag regularly. The drainage spout should not touch anything while emptying the bag. WHAT CAN I DO TO HELP PREVENT CATHETER-ASSOCIATED URINARY TRACT INFECTIONS IF I HAVE A CATHETER?  Always clean your hands before and after doing catheter care.  Always keep your urine bag below the level of your bladder.  Do not tug or pull on the tubing.  Do not twist or kink the catheter tubing.  Ask your healthcare provider each day if you still need the catheter. WHAT DO I NEED TO DO WHEN I [place]?  If you will be going home with a catheter, your doctor or nurse should explain everything you need to know about taking care of the catheter. Make sure you understand how to care for it before you leave the hospital.  If you develop any of the symptoms of a urinary tract infection, such as burning or pain in the lower abdomen, fever, or an increase in the frequency of urination, contact your doctor or nurse immediately.  Before you go home, make sure you know who to contact if you have questions or problems after you get home. If you have questions, please ask your doctor or nurse. Developed and co-sponsored by [employer] for [place] [phone]); Infectious Diseases Society of [place] (IDSA); [employer] [place]; Association for Professionals in Infection Control and Epidemiology (APIC); Center for Disease Control (CDC); and The Joint Commission Document Released: 12/15/2011 Document Reviewed: 12/15/2011 [facility] Patient Information 2015 [place]. This information is not intended to replace advice given to you by your health care provider. Make  sure you discuss any questions you have with your health care provider.

## 2014-06-02 NOTE — ED Provider Notes (Signed)
CSN: [id number]     Arrival date & time 06/02/14  1414 History   First MD Initiated Contact with Patient 06/02/14 1504     Chief Complaint  Patient presents with  . Dysuria     (Consider location/radiation/quality/duration/timing/severity/associated sxs/prior Treatment) The history is provided by the patient and medical records. No language interpreter was used.      Natasha Fletcher is a 81 y.o. female  with a hx of HTN, asthma, glaucoma, incontinence, breast cancer, arthritis presents to the Emergency Department complaining of gradual, persistent, progressively worsening suprapubic abd pain/burning with urinary frequency and leakage of urine around her foley catheter onset yesterday.  Pt reports her urologist placed the foley 1 week ago to assist with her incontinence.  She denies generalized abd pain, nausea, vomiting, fever, diarrhea.  Pt with hx of abd hysterectomy, appendectromy. Pt reports attempting to change the catheter but was unsuccessful.  No aggravating or alleviating factor.  Pt denies fever, chills, nausea, vomiting, palpitations, diarrhea, melena, hematuria.     Past Medical History  Diagnosis Date  . Hypertension   . Asthma   . Glaucoma   . Incontinence   . Wears dentures   . Wears glasses   . Thyroid disease     "something years ago"  . GERD (gastroesophageal reflux disease)   . Cancer     rt breast  . Edema leg   . Fast breathing     "because of pill"  . Pneumonia     h/o younger  . Arthritis     "all over"  . S/P total knee arthroplasty   . Glaucoma   . Swelling     bilateral feet/ legs, more left.   Past Surgical History  Procedure Laterality Date  . Total hip arthroplasty Bilateral   . Shoulder surgery Left   . Appendectomy    . Carpal tunnel release Bilateral   . Tonsillectomy    . Colonoscopy    . Abdominal hysterectomy    . Eye surgery      both cataracts  . Breast biopsy  04/27/2011    Procedure: BREAST BIOPSY WITH NEEDLE LOCALIZATION;   Surgeon: Edward Jolly, MD;  Location: [facility];  Service: General;  Laterality: Right;  right needle localized breast lumpectomy   . Breast lumpectomy  04/27/11    right breast by [facility]  . Total knee arthroplasty Right 02/19/2013    Procedure: RIGHT TOTAL KNEE ARTHROPLASTY;  Surgeon: Gearlean Alf, MD;  Location: [facility];  Service: Orthopedics;  Laterality: Right;  . Total knee arthroplasty Left 10/15/2013    Procedure: LEFT TOTAL KNEE ARTHROPLASTY;  Surgeon: Gearlean Alf, MD;  Location: [facility];  Service: Orthopedics;  Laterality: Left;  . Cardioversion N/A 02/01/2014    Procedure: CARDIOVERSION;  Surgeon: Candee Furbish, MD;  Location: [facility];  Service: Cardiovascular;  Laterality: N/A;  . Tee without cardioversion N/A 02/01/2014    Procedure: TRANSESOPHAGEAL ECHOCARDIOGRAM (TEE);  Surgeon: Candee Furbish, MD;  Location: [facility] ENDOSCOPY;  Service: Cardiovascular;  Laterality: N/A;   Family History  Problem Relation Age of Onset  . Heart disease Mother   . Stroke Father    History  Substance Use Topics  . Smoking status: Former Smoker -- 0.00 packs/day for 30 years    Types: Cigarettes    Quit date: 04/21/1975  . Smokeless tobacco: Never Used  . Alcohol Use: Yes     Comment: occasionally   OB History    No  data available     Review of Systems  Constitutional: Negative for fever, diaphoresis, appetite change, fatigue and unexpected weight change.  HENT: Negative for mouth sores.   Eyes: Negative for visual disturbance.  Respiratory: Negative for cough, chest tightness, shortness of breath and wheezing.   Cardiovascular: Negative for chest pain.  Gastrointestinal: Positive for abdominal pain ( Suprapubic). Negative for nausea, vomiting, diarrhea and constipation.  Endocrine: Negative for polydipsia, polyphagia and polyuria.  Genitourinary: Positive for dysuria and frequency. Negative for urgency and hematuria.  Musculoskeletal: Negative for back pain and  neck stiffness.  Skin: Negative for rash.  Allergic/Immunologic: Negative for immunocompromised state.  Neurological: Negative for syncope, light-headedness and headaches.  Hematological: Does not bruise/bleed easily.  Psychiatric/Behavioral: Negative for sleep disturbance. The patient is not nervous/anxious.       Allergies  Milk-related compounds and Sulfa antibiotics  Home Medications   Prior to Admission medications   Medication Sig Start Date End Date Taking? Authorizing Provider  anastrozole (ARIMIDEX) 1 MG tablet TAKE 1 TABLET (1 MG TOTAL) BY MOUTH DAILY. 05/10/14  Yes Rulon Eisenmenger, MD  apixaban (ELIQUIS) 5 MG TABS tablet Take 1 tablet (5 mg total) by mouth 2 (two) times daily. 05/30/14  Yes Troy Sine, MD  furosemide (LASIX) 20 MG tablet Take 20 mg by mouth 2 (two) times daily.    Yes Historical Provider, MD  latanoprost (XALATAN) 0.005 % ophthalmic solution Place 1 drop into both eyes at bedtime.   Yes Historical Provider, MD  metoprolol (LOPRESSOR) 50 MG tablet Take 1.5 tablets (75 mg total) by mouth 2 (two) times daily. 01/24/14  Yes Erlene Quan, PA-C  tamsulosin (FLOMAX) 0.4 MG CAPS capsule Take 0.4 mg by mouth daily.   Yes Historical Provider, MD  traMADol (ULTRAM) 50 MG tablet Take 50 mg by mouth every 12 (twelve) hours as needed for moderate pain.  04/17/14  Yes Historical Provider, MD  cephALEXin (KEFLEX) 500 MG capsule Take 1 capsule (500 mg total) by mouth 4 (four) times daily. 06/02/14   Lonny Eisen, PA-C   BP 157/79 mmHg  Pulse 65  Temp(Src) 97.9 F (36.6 C) (Oral)  Resp 16  SpO2 100% Physical Exam  Constitutional: She appears well-developed and well-nourished. No distress.  Awake, alert, nontoxic appearance  HENT:  Head: Normocephalic and atraumatic.  Mouth/Throat: Oropharynx is clear and moist. No oropharyngeal exudate.  Eyes: Conjunctivae are normal. No scleral icterus.  Neck: Normal range of motion. Neck supple.  Cardiovascular: Normal rate,  regular rhythm, normal heart sounds and intact distal pulses.   No murmur heard. Pulmonary/Chest: Effort normal and breath sounds normal. No respiratory distress. She has no wheezes.  Equal chest expansion  Abdominal: Soft. Bowel sounds are normal. She exhibits no distension and no mass. There is tenderness in the suprapubic area. There is no rebound, no guarding and no CVA tenderness.  Mild suprapubic abdominal tenderness Abdomen soft No rigidity or guarding No CVA tenderness  Musculoskeletal: Normal range of motion. She exhibits no edema.  Neurological: She is alert. She exhibits normal muscle tone. Coordination normal.  Speech is clear and goal oriented Moves extremities without ataxia  Skin: Skin is warm and dry. She is not diaphoretic. No erythema.  Psychiatric: She has a normal mood and affect.  Nursing note and vitals reviewed.   ED Course  Procedures (including critical care time) Labs Review Labs Reviewed  URINALYSIS, ROUTINE W REFLEX MICROSCOPIC - Abnormal; Notable for the following:    APPearance CLOUDY (*)  Hgb urine dipstick MODERATE (*)    Protein, ur 30 (*)    Nitrite POSITIVE (*)    Leukocytes, UA LARGE (*)    All other components within normal limits  URINE MICROSCOPIC-ADD ON - Abnormal; Notable for the following:    Bacteria, UA FEW (*)    All other components within normal limits  I-STAT CHEM 8, ED - Abnormal; Notable for the following:    Hemoglobin 11.9 (*)    HCT 35.0 (*)    All other components within normal limits  URINE CULTURE    Imaging Review No results found.   EKG Interpretation None      MDM   Final diagnoses:  UTI (lower urinary tract infection)   Natasha Fletcher presents with suprapubic abdominal pain, dysuria and urinary frequency. Patient was recently permanently catheterized for urinary incontinence. Concern for possible urinary tract infection. Blood or scan with 500 mL. Will exchange Foley catheter and check i-STAT to ensure  no renal failure.  4:42 PM UA with evidence of urinary tract infection.  5:25 PM  Chem 8 without elevation in BUN or creatinine.  Pt has been diagnosed with a UTI. Pt is afebrile, no CVA tenderness, normotensive, and denies N/V. Pt to be dc home with antibiotics and instructions to follow up with Urology if symptoms persist.  I have personally reviewed patient's vitals, nursing note and any pertinent labs or imaging.  I performed an undressed physical exam.    It has been determined that no acute conditions requiring further emergency intervention are present at this time. The patient/guardian have been advised of the diagnosis and plan. I reviewed all labs and imaging including any potential incidental findings. We have discussed signs and symptoms that warrant return to the ED and they are listed in the discharge instructions.    Vital signs are stable at discharge.   BP 157/79 mmHg  Pulse 65  Temp(Src) 97.9 F (36.6 C) (Oral)  Resp 16  SpO2 100%  The patient was discussed with and seen by Dr. Tomi Bamberger who agrees with the treatment plan.    Natasha Soho Florina Glas, PA-C 06/02/14 1726  Dorie Rank, MD 06/02/14 [phone]

## 2014-06-02 NOTE — ED Notes (Signed)
Bladder scan was in amount of 500cc. Nurse was notified.

## 2014-06-02 NOTE — ED Notes (Signed)
Pt states severe urinary pain at catheter x 2 days.

## 2014-06-03 NOTE — Progress Notes (Signed)
  CARE MANAGEMENT ED NOTE 06/03/2014  Patient:  Natasha Fletcher, Natasha Fletcher   Account Number:  [id number]  Date Initiated:  06/03/2014  Documentation initiated by:  Jackelyn Poling  Subjective/Objective Assessment:   81 yr old bcbs medicare pt seen in ED on 06/02/14 d/c with Rx she is not able to afford Pt requesting change in med dx UTi dc home with antibiotics and instructions to follow up with Urology if symptoms persist.     Subjective/Objective Assessment Detail:   pcp Ollen Bowl  Pt reports preferred pharmacy is [facility]  EPIC indicates pt seen by [employer], PA  EPIC indicated d/.c with keflex 500 mg qid total 40 caps  Goodrx indicates cost  1340 pt agrees to have cipro500 mg bid x 7 days total 14 tabs Dr Ernestina Patches medication called in to [facility] for $4 cost  pt states she will have her brother to pick up med after 7 pm when he gets off work Pt very Patent attorney of services and discussion of costs of different meds and pharmacy charges Left Cm office number if needed at later time     Action/Plan:   ED Cm received call from pt transferred by [facility] ED Discussed concern with med change Informed pt CM would try to find PA or consult another ED provider and return call to her   Action/Plan Detail:   1340 updated pt called [facility] [phone] spoke with Sameer to call in cipro500 mg bid x 7 days total 14 tabs Dr Ernestina Patches   Anticipated DC Date:  06/02/2014     Status Recommendation to Physician:   Result of Recommendation:    Other ED [place]  Other  Outpatient Services - Pt will follow up  Medication Assistance    Choice offered to / List presented to:            Status of service:  Completed, signed off  ED Comments:   ED Comments Detail:

## 2014-06-04 LAB — URINE CULTURE: Colony Count: >=100000

## 2014-06-10 ENCOUNTER — Ambulatory Visit: Payer: BLUE CROSS/BLUE SHIELD | Admitting: Pharmacist Clinician (PhC)/ Clinical Pharmacy Specialist

## 2014-06-12 ENCOUNTER — Ambulatory Visit (INDEPENDENT_AMBULATORY_CARE_PROVIDER_SITE_OTHER): Payer: BLUE CROSS/BLUE SHIELD | Admitting: Pharmacist Clinician (PhC)/ Clinical Pharmacy Specialist

## 2014-06-12 DIAGNOSIS — Z7901 Long term (current) use of anticoagulants: Secondary | ICD-10-CM

## 2014-06-12 NOTE — Patient Instructions (Signed)
Continue your Eliquis twice daily until all tablets are gone.   When you have 6 tablets left (a 3 day supply) you will need to start the warfarin (Coumadin). The warfarin will be a 2.5 mg tablet (light green) and you take 1 tablet each evening.  I will need to see you back in the office after about 5 doses of warfarin to do your first blood check.    Your next appointment with Lurena Joiner is on March 24 @ 10:30 am  Your next appointment with Erasmo Downer (Coumadin Clinic) is on Friday April 1 @ 11am

## 2014-06-13 NOTE — Progress Notes (Signed)
Pt was in office today for transition from Eliquis to Warfarin.  Unable to afford copays on Eliquis.  Had discussion about need for regular monitoring on warfarin and foods that need to be avoided on daily basis.  Pt willing to switch and come for INR checks.  She still has 3 weeks of Eliquis left, so she will continue that.  She was given written instructions on how to overlap medications by 3 days and was set for new coumadin appointment accordingly.

## 2014-06-19 ENCOUNTER — Other Ambulatory Visit: Payer: Self-pay | Admitting: Hematology and Oncology

## 2014-06-20 ENCOUNTER — Other Ambulatory Visit: Payer: Self-pay | Admitting: *Deleted

## 2014-06-20 DIAGNOSIS — C50119 Malignant neoplasm of central portion of unspecified female breast: Secondary | ICD-10-CM

## 2014-06-20 MED ORDER — ANASTROZOLE 1 MG PO TABS: 1.0000 mg | ORAL_TABLET | Freq: Every day | ORAL | Status: DC

## 2014-06-27 ENCOUNTER — Encounter: Payer: Self-pay | Admitting: Cardiology

## 2014-06-27 ENCOUNTER — Ambulatory Visit (INDEPENDENT_AMBULATORY_CARE_PROVIDER_SITE_OTHER): Payer: Medicare Other | Admitting: Cardiology

## 2014-06-27 VITALS — BP 128/70 | HR 58 | Ht 63.5 in | Wt 153.3 lb

## 2014-06-27 DIAGNOSIS — I480 Paroxysmal atrial fibrillation: Secondary | ICD-10-CM | POA: Diagnosis not present

## 2014-06-27 DIAGNOSIS — I5032 Chronic diastolic (congestive) heart failure: Secondary | ICD-10-CM

## 2014-06-27 DIAGNOSIS — Z7901 Long term (current) use of anticoagulants: Secondary | ICD-10-CM | POA: Diagnosis not present

## 2014-06-27 DIAGNOSIS — I10 Essential (primary) hypertension: Secondary | ICD-10-CM | POA: Diagnosis not present

## 2014-06-27 NOTE — Patient Instructions (Addendum)
Your physician wants you to follow-up in: 6 Months with Dr Jimmey Ralph will receive a reminder letter in the mail two months in advance. If you don't receive a letter, please call our office to schedule the follow-up appointment.  Go to [employer] Good [email or web address] to check for Diltiazem price  Your physician has recommended you make the following change in your medication: Decrease Metoprolol to 50 mg twice a day

## 2014-06-27 NOTE — Progress Notes (Signed)
06/27/2014 Natasha Fletcher   01/05/1926  [id number]  Primary Physician Natasha Jacobson, MD Primary Cardiologist: Dr Natasha Fletcher  HPI:  81 y/o female admitted for Rt TKR 10/08/13. Post op she had anemia requiring transfusion, urinary retention, and dehydration. She was discharged to SNF for rehab 10/20/13. On 11/05/13 she was admitted through the ER with acute renal failure with a SCr of 7.20 and a K+ of 6.2. This improved with placement of a foley, stopping her ACE, and IV hydration. She was discharged 11/08/13 and admitted again on 11/10/13 with near syncope and tachycardia. She was noted to be in AF with RVR. She was treated with Diltiazem and beta blocker. Her rhythm appeared to A flutter. We saw her in consult 11/11/13. Echo showed an EF of 65-70% with grade 2 diastolic dysfunction. She had significant intracavity gradient of 16 mmHg.   She was seen in the office 12/31/13 for cardiac follow up and to discuss elective OP cardioversion. She was discharged 11/17/13 on Eliquis 2.5 mg BID but did not fill the prescription because of cost. It was determined that she should actually be on 5 mg BID. She was given samples but when she presented for DCCV 01/29/14 it could not be confirmed she was taking this dose. Her DCCV was rescheduled for 02/01/14 and was successful. When seen in follow up in Nov 2015 she was in NSR and doing well.              She is in the office today for f/u. Her brother Natasha Fletcher did not accompany her today because of a prior appointment. She notices a little "wheezing" when she exerts herself. She denies chest pain or tightness. She has some LE edema at the end of the day.    Current Outpatient Prescriptions  Medication Sig Dispense Refill  . anastrozole (ARIMIDEX) 1 MG tablet Take 1 tablet (1 mg total) by mouth daily. 30 tablet 3  . apixaban (ELIQUIS) 5 MG TABS tablet Take 1 tablet (5 mg total) by mouth 2 (two) times daily. 60 tablet 6  . cephALEXin (KEFLEX) 500 MG capsule Take 1  capsule (500 mg total) by mouth 4 (four) times daily. 40 capsule 0  . diltiazem (DILACOR XR) 180 MG 24 hr capsule Take 180 mg by mouth daily.    . furosemide (LASIX) 20 MG tablet Take 20 mg by mouth 2 (two) times daily.     Marland Kitchen HYDROcodone-acetaminophen (NORCO/VICODIN) 5-325 MG per tablet Take 1 tablet by mouth as needed.  0  . latanoprost (XALATAN) 0.005 % ophthalmic solution Place 1 drop into both eyes at bedtime.    . metoprolol (LOPRESSOR) 50 MG tablet Take 1.5 tablets (75 mg total) by mouth 2 (two) times daily. 90 tablet 6  . tamsulosin (FLOMAX) 0.4 MG CAPS capsule Take 0.4 mg by mouth daily.     No current facility-administered medications for this visit.    Allergies  Allergen Reactions  . Milk-Related Compounds Other (See Comments)    Upset stomach   . Sulfa Antibiotics Other (See Comments)    Reaction many years ago-unknown    History   Social History  . Marital Status: Single    Spouse Name: N/A  . Number of Children: N/A  . Years of Education: N/A   Occupational History  . Not on file.   Social History Main Topics  . Smoking status: Former Smoker -- 0.00 packs/day for 30 years    Types: Cigarettes    Quit date:  04/21/1975  . Smokeless tobacco: Never Used  . Alcohol Use: Yes     Comment: occasionally  . Drug Use: No  . Sexual Activity: Not Currently   Other Topics Concern  . Not on file   Social History Narrative   From SNF, getting PT and using cane.       Review of Systems: General: negative for chills, fever, night sweats or weight changes.  Cardiovascular: negative for chest pain, dyspnea on exertion, edema, orthopnea, palpitations, paroxysmal nocturnal dyspnea or shortness of breath Dermatological: negative for rash Respiratory: negative for cough or wheezing Urologic: negative for hematuria Abdominal: negative for nausea, vomiting, diarrhea, bright red blood per rectum, melena, or hematemesis Neurologic: negative for visual changes, syncope, or  dizziness All other systems reviewed and are otherwise negative except as noted above.    Blood pressure 128/70, pulse 58, height 5' 3.5" (1.613 m), weight 153 lb 4.8 oz (69.536 kg).  General appearance: alert, cooperative and no distress Neck: no carotid bruit and no JVD Lungs: clear to auscultation bilaterally Heart: regular rate and rhythm and soft systolic murmur Extremities: trace edema  EKG NSR, SB 58  ASSESSMENT AND PLAN:   Atrial fibrillation with RVR Now in NSR/SB on Eliquis and Lopressor 50 mg BID. She feels better, more energy.  Hypertension Controlled  Chronic anticoagulation Eliquis 5 mg BID   PLAN  I cut her Lopressor back to 50 mg BID. If her HR remains low or she becomes symptomatic I would cut her Diltiazem back. She can see Dr Natasha Fletcher in 6 months.   Natasha Fletcher KPA-C 06/27/2014 11:29 AM

## 2014-07-05 ENCOUNTER — Ambulatory Visit (INDEPENDENT_AMBULATORY_CARE_PROVIDER_SITE_OTHER): Payer: Medicare Other | Admitting: Pharmacist Clinician (PhC)/ Clinical Pharmacy Specialist

## 2014-07-05 DIAGNOSIS — Z7901 Long term (current) use of anticoagulants: Secondary | ICD-10-CM

## 2014-07-05 DIAGNOSIS — I4892 Unspecified atrial flutter: Secondary | ICD-10-CM

## 2014-07-05 MED ORDER — WARFARIN SODIUM 5 MG PO TABS: 5.0000 mg | ORAL_TABLET | Freq: Every day | ORAL | Status: DC

## 2014-07-07 NOTE — Progress Notes (Signed)
Pt has not actually started warfarin.  Was told to overlap with Eliquis for 3 days.  Ran out of Eliquis on Tuesday.    Reviewed warfarin information: side effects, bleeding/bruising concerns, foods to be cautious of, need for regular monitoring.    Gave patient 3 day supply of Eliquis, to start this am and start warfarin 5 mg daily tonight.  Will see her back in 1 week for INR check

## 2014-07-12 ENCOUNTER — Ambulatory Visit (INDEPENDENT_AMBULATORY_CARE_PROVIDER_SITE_OTHER): Payer: Medicare Other | Admitting: Pharmacist Clinician (PhC)/ Clinical Pharmacy Specialist

## 2014-07-12 DIAGNOSIS — Z7901 Long term (current) use of anticoagulants: Secondary | ICD-10-CM | POA: Diagnosis not present

## 2014-07-12 DIAGNOSIS — I480 Paroxysmal atrial fibrillation: Secondary | ICD-10-CM | POA: Diagnosis not present

## 2014-07-12 LAB — POCT INR: INR: 2.5

## 2014-07-19 ENCOUNTER — Ambulatory Visit (INDEPENDENT_AMBULATORY_CARE_PROVIDER_SITE_OTHER): Payer: Medicare Other | Admitting: Pharmacist Clinician (PhC)/ Clinical Pharmacy Specialist

## 2014-07-19 DIAGNOSIS — I480 Paroxysmal atrial fibrillation: Secondary | ICD-10-CM | POA: Diagnosis not present

## 2014-07-19 DIAGNOSIS — Z7901 Long term (current) use of anticoagulants: Secondary | ICD-10-CM

## 2014-07-19 LAB — POCT INR: INR: 3

## 2014-07-29 ENCOUNTER — Ambulatory Visit: Payer: Medicare Other | Admitting: Pharmacist Clinician (PhC)/ Clinical Pharmacy Specialist

## 2014-08-05 ENCOUNTER — Ambulatory Visit: Payer: Medicare Other | Admitting: Pharmacist Clinician (PhC)/ Clinical Pharmacy Specialist

## 2014-08-07 ENCOUNTER — Ambulatory Visit: Payer: Medicare Other | Admitting: Pharmacist Clinician (PhC)/ Clinical Pharmacy Specialist

## 2014-08-08 ENCOUNTER — Ambulatory Visit (INDEPENDENT_AMBULATORY_CARE_PROVIDER_SITE_OTHER): Payer: Medicare Other | Admitting: Pharmacist Clinician (PhC)/ Clinical Pharmacy Specialist

## 2014-08-08 ENCOUNTER — Ambulatory Visit: Payer: Medicare Other | Admitting: Pharmacist Clinician (PhC)/ Clinical Pharmacy Specialist

## 2014-08-08 DIAGNOSIS — Z7901 Long term (current) use of anticoagulants: Secondary | ICD-10-CM | POA: Diagnosis not present

## 2014-08-08 DIAGNOSIS — I480 Paroxysmal atrial fibrillation: Secondary | ICD-10-CM | POA: Diagnosis not present

## 2014-08-08 DIAGNOSIS — C50119 Malignant neoplasm of central portion of unspecified female breast: Secondary | ICD-10-CM | POA: Diagnosis not present

## 2014-08-08 LAB — POCT INR: INR: 2.9

## 2014-08-10 ENCOUNTER — Encounter (HOSPITAL_COMMUNITY): Payer: Self-pay | Admitting: *Deleted

## 2014-08-10 ENCOUNTER — Emergency Department (HOSPITAL_COMMUNITY)
Admission: EM | Admit: 2014-08-10 | Discharge: 2014-08-10 | Disposition: A | Discharge status: Home / Self Care | Payer: Medicare Other | Attending: Emergency Medicine | Admitting: Emergency Medicine

## 2014-08-10 DIAGNOSIS — J45909 Unspecified asthma, uncomplicated: Secondary | ICD-10-CM | POA: Insufficient documentation

## 2014-08-10 DIAGNOSIS — Z8701 Personal history of pneumonia (recurrent): Secondary | ICD-10-CM | POA: Insufficient documentation

## 2014-08-10 DIAGNOSIS — I10 Essential (primary) hypertension: Secondary | ICD-10-CM | POA: Insufficient documentation

## 2014-08-10 DIAGNOSIS — Z8669 Personal history of other diseases of the nervous system and sense organs: Secondary | ICD-10-CM | POA: Diagnosis not present

## 2014-08-10 DIAGNOSIS — Z90710 Acquired absence of both cervix and uterus: Secondary | ICD-10-CM | POA: Diagnosis not present

## 2014-08-10 DIAGNOSIS — M1990 Unspecified osteoarthritis, unspecified site: Secondary | ICD-10-CM | POA: Diagnosis not present

## 2014-08-10 DIAGNOSIS — Z79899 Other long term (current) drug therapy: Secondary | ICD-10-CM | POA: Diagnosis not present

## 2014-08-10 DIAGNOSIS — Z853 Personal history of malignant neoplasm of breast: Secondary | ICD-10-CM | POA: Insufficient documentation

## 2014-08-10 DIAGNOSIS — R109 Unspecified abdominal pain: Secondary | ICD-10-CM | POA: Diagnosis present

## 2014-08-10 DIAGNOSIS — Z8719 Personal history of other diseases of the digestive system: Secondary | ICD-10-CM | POA: Diagnosis not present

## 2014-08-10 DIAGNOSIS — Y846 Urinary catheterization as the cause of abnormal reaction of the patient, or of later complication, without mention of misadventure at the time of the procedure: Secondary | ICD-10-CM | POA: Insufficient documentation

## 2014-08-10 DIAGNOSIS — M5416 Radiculopathy, lumbar region: Secondary | ICD-10-CM | POA: Insufficient documentation

## 2014-08-10 DIAGNOSIS — Z8639 Personal history of other endocrine, nutritional and metabolic disease: Secondary | ICD-10-CM | POA: Insufficient documentation

## 2014-08-10 DIAGNOSIS — T83098A Other mechanical complication of other indwelling urethral catheter, initial encounter: Secondary | ICD-10-CM | POA: Diagnosis not present

## 2014-08-10 DIAGNOSIS — T839XXA Unspecified complication of genitourinary prosthetic device, implant and graft, initial encounter: Secondary | ICD-10-CM

## 2014-08-10 LAB — CBC WITH DIFFERENTIAL/PLATELET
Basophils Absolute: 0 10*3/uL (ref 0.0–0.1)
Basophils Relative: 0 % (ref 0–1)
Eosinophils Absolute: 0.1 10*3/uL (ref 0.0–0.7)
Eosinophils Relative: 1 % (ref 0–5)
HEMATOCRIT: 35.9 % — AB (ref 36.0–46.0)
Hemoglobin: 11.7 g/dL — ABNORMAL LOW (ref 12.0–15.0)
LYMPHS ABS: 1.1 10*3/uL (ref 0.7–4.0)
Lymphocytes Relative: 27 % (ref 12–46)
MCH: 26.7 pg (ref 26.0–34.0)
MCHC: 32.6 g/dL (ref 30.0–36.0)
MCV: 81.8 fL (ref 78.0–100.0)
MONO ABS: 0.2 10*3/uL (ref 0.1–1.0)
Monocytes Relative: 5 % (ref 3–12)
Neutro Abs: 2.6 10*3/uL (ref 1.7–7.7)
Neutrophils Relative %: 67 % (ref 43–77)
Platelets: 201 10*3/uL (ref 150–400)
RBC: 4.39 MIL/uL (ref 3.87–5.11)
RDW: 13.5 % (ref 11.5–15.5)
WBC: 3.9 10*3/uL — ABNORMAL LOW (ref 4.0–10.5)

## 2014-08-10 LAB — URINALYSIS, ROUTINE W REFLEX MICROSCOPIC
Bilirubin Urine: NEGATIVE
GLUCOSE, UA: NEGATIVE mg/dL
KETONES UR: NEGATIVE mg/dL
Nitrite: NEGATIVE
PROTEIN: 30 mg/dL — AB
Specific Gravity, Urine: 1.013 (ref 1.005–1.030)
Urobilinogen, UA: 0.2 mg/dL (ref 0.0–1.0)
pH: 7.5 (ref 5.0–8.0)

## 2014-08-10 LAB — COMPREHENSIVE METABOLIC PANEL
ALBUMIN: 4 g/dL (ref 3.5–5.0)
ALT: 21 U/L (ref 14–54)
AST: 32 U/L (ref 15–41)
Alkaline Phosphatase: 96 U/L (ref 38–126)
Anion gap: 10 (ref 5–15)
BUN: 14 mg/dL (ref 6–20)
CHLORIDE: 102 mmol/L (ref 101–111)
CO2: 25 mmol/L (ref 22–32)
Calcium: 9.5 mg/dL (ref 8.9–10.3)
Creatinine, Ser: 0.86 mg/dL (ref 0.44–1.00)
GFR calc Af Amer: >60 mL/min (ref >60)
GFR calc non Af Amer: 59 mL/min — ABNORMAL LOW (ref >60)
Glucose, Bld: 97 mg/dL (ref 70–99)
POTASSIUM: 3.4 mmol/L — AB (ref 3.5–5.1)
Sodium: 137 mmol/L (ref 135–145)
Total Bilirubin: 1.4 mg/dL — ABNORMAL HIGH (ref 0.3–1.2)
Total Protein: 7.3 g/dL (ref 6.5–8.1)

## 2014-08-10 LAB — URINE MICROSCOPIC-ADD ON

## 2014-08-10 LAB — LIPASE, BLOOD: LIPASE: 21 U/L — AB (ref 22–51)

## 2014-08-10 MED ORDER — HYDROMORPHONE HCL 1 MG/ML IJ SOLN
1.0000 mg | Freq: Once | INTRAMUSCULAR | Status: AC
  Administered 2014-08-10: 1 mg via INTRAMUSCULAR
  Filled 2014-08-10: qty 1

## 2014-08-10 NOTE — ED Notes (Signed)
The pt is c/o pain around her foley cath and rt leg pain for 2-3 days.  The pt has had a catheter since august.  Her catheter has not been changed for one month.  She reports that it has not been draining as much as usual for a few days

## 2014-08-10 NOTE — ED Provider Notes (Addendum)
CSN: [id number]     Arrival date & time 08/10/14  1606 History   First MD Initiated Contact with Patient 08/10/14 1726     Chief Complaint  Patient presents with  . Abdominal Pain     (Consider location/radiation/quality/duration/timing/severity/associated sxs/prior Treatment) HPI Comments: Pt has an indwelling foley that she has had for 36months last changed 2 months ago and started to have sharp pain in the urethra today and frequent urine drainage around the catheter.  Since this morning patient has had no urine in her catheter bag. Yesterday she felt fine and denied any problems. No fever, nausea, vomiting, change in urine color foul smell.    Secondly patient has a history of chronic right leg pain who she sees Dr. Nelva Bush for.  2 weeks ago she got a steroid injection that was minimally helpful and she continues to have right leg pain. The pain has not changed and she has not developed weakness or stool incontinence.  She does take oxycodone for this pain but states it just is not helping treat the pain  Patient is a 81 y.o. female presenting with dysuria. The history is provided by the patient.  Dysuria Pain quality:  Sharp and shooting Pain severity:  Moderate Onset quality:  Sudden Duration:  1 day Timing:  Constant Progression:  Unchanged Chronicity:  New Recent urinary tract infections: no   Relieved by:  None tried Worsened by:  Nothing tried Ineffective treatments:  None tried Urinary symptoms: incontinence   Urinary symptoms: no discolored urine, no foul-smelling urine and no hematuria   Associated symptoms: no abdominal pain, no fever, no flank pain, no nausea and no vomiting   Associated symptoms comment:  Vaginal pain Risk factors: urinary catheter use   Risk factors: no recurrent urinary tract infections and no sexually transmitted infections     Past Medical History  Diagnosis Date  . Hypertension   . Asthma   . Glaucoma   . Incontinence   . Wears dentures   .  Wears glasses   . Thyroid disease     "something years ago"  . GERD (gastroesophageal reflux disease)   . Cancer     rt breast  . Edema leg   . Fast breathing     "because of pill"  . Pneumonia     h/o younger  . Arthritis     "all over"  . S/P total knee arthroplasty   . Glaucoma   . Swelling     bilateral feet/ legs, more left.   Past Surgical History  Procedure Laterality Date  . Total hip arthroplasty Bilateral   . Shoulder surgery Left   . Appendectomy    . Carpal tunnel release Bilateral   . Tonsillectomy    . Colonoscopy    . Abdominal hysterectomy    . Eye surgery      both cataracts  . Breast biopsy  04/27/2011    Procedure: BREAST BIOPSY WITH NEEDLE LOCALIZATION;  Surgeon: Edward Jolly, MD;  Location: [facility];  Service: General;  Laterality: Right;  right needle localized breast lumpectomy   . Breast lumpectomy  04/27/11    right breast by [facility]  . Total knee arthroplasty Right 02/19/2013    Procedure: RIGHT TOTAL KNEE ARTHROPLASTY;  Surgeon: Gearlean Alf, MD;  Location: [facility];  Service: Orthopedics;  Laterality: Right;  . Total knee arthroplasty Left 10/15/2013    Procedure: LEFT TOTAL KNEE ARTHROPLASTY;  Surgeon: Gearlean Alf, MD;  Location: [facility];  Service: Orthopedics;  Laterality: Left;  . Cardioversion N/A 02/01/2014    Procedure: CARDIOVERSION;  Surgeon: Candee Furbish, MD;  Location: [facility];  Service: Cardiovascular;  Laterality: N/A;  . Tee without cardioversion N/A 02/01/2014    Procedure: TRANSESOPHAGEAL ECHOCARDIOGRAM (TEE);  Surgeon: Candee Furbish, MD;  Location: [facility] ENDOSCOPY;  Service: Cardiovascular;  Laterality: N/A;   Family History  Problem Relation Age of Onset  . Heart disease Mother   . Stroke Father    History  Substance Use Topics  . Smoking status: Former Smoker -- 0.00 packs/day for 30 years    Types: Cigarettes    Quit date: 04/21/1975  . Smokeless tobacco: Never Used  . Alcohol Use: Yes      Comment: occasionally   OB History    No data available     Review of Systems  Constitutional: Negative for fever.  Gastrointestinal: Negative for nausea, vomiting and abdominal pain.  Genitourinary: Positive for dysuria. Negative for flank pain.  All other systems reviewed and are negative.     Allergies  Lactose intolerance (gi); Milk-related compounds; and Sulfa antibiotics  Home Medications   Prior to Admission medications   Medication Sig Start Date End Date Taking? Authorizing Provider  anastrozole (ARIMIDEX) 1 MG tablet Take 1 tablet (1 mg total) by mouth daily. 06/20/14  Yes Nicholas Lose, MD  diltiazem (DILACOR XR) 180 MG 24 hr capsule Take 180 mg by mouth daily.   Yes Historical Provider, MD  furosemide (LASIX) 20 MG tablet Take 20 mg by mouth 2 (two) times daily.    Yes Historical Provider, MD  latanoprost (XALATAN) 0.005 % ophthalmic solution Place 1 drop into both eyes at bedtime.   Yes Historical Provider, MD  metoprolol (LOPRESSOR) 50 MG tablet Take 1.5 tablets (75 mg total) by mouth 2 (two) times daily. 01/24/14  Yes Erlene Quan, PA-C  oxyCODONE-acetaminophen (PERCOCET/ROXICET) 5-325 MG per tablet Take by mouth 3 (three) times daily as needed. For pain 08/08/14  Yes Historical Provider, MD  tamsulosin (FLOMAX) 0.4 MG CAPS capsule Take 0.4 mg by mouth daily.   Yes Historical Provider, MD  warfarin (COUMADIN) 5 MG tablet Take 1 tablet (5 mg total) by mouth daily. 07/05/14  Yes Troy Sine, MD  cephALEXin (KEFLEX) 500 MG capsule Take 1 capsule (500 mg total) by mouth 4 (four) times daily. Patient not taking: Reported on 08/10/2014 06/02/14   Jarrett Soho Muthersbaugh, PA-C  flurazepam [facility]) 15 MG capsule Take 15 mg by mouth at bedtime as needed for sleep.  06/19/14   Historical Provider, MD   BP 160/88 mmHg  Pulse 66  Temp(Src) 97.5 F (36.4 C) (Oral)  Resp 16  SpO2 100% Physical Exam  Constitutional: She is oriented to person, place, and time. She appears  well-developed and well-nourished. No distress.  HENT:  Head: Normocephalic and atraumatic.  Mouth/Throat: Oropharynx is clear and moist.  Eyes: Conjunctivae and EOM are normal. Pupils are equal, round, and reactive to light.  Neck: Normal range of motion. Neck supple.  Cardiovascular: Normal rate, regular rhythm and intact distal pulses.   No murmur heard. Pulmonary/Chest: Effort normal and breath sounds normal. No respiratory distress. She has no wheezes. She has no rales.  Abdominal: Soft. She exhibits distension. There is no tenderness. There is no rebound and no guarding.  Mild suprapubic tenderness and distention.  No CVA tenderness.  Genitourinary:  Catheter in place but clear yellow urine leaking extensively around the catheter without any urine in  the bag  Musculoskeletal: Normal range of motion. She exhibits no edema or tenderness.  Pain along the L4 distribution.  Able to raise the leg off the bed without difficulty and unable to reproduce pain with palpation.  2+ DP pulse in the right leg.  No spinal tenderness.  Neurological: She is alert and oriented to person, place, and time.  Skin: Skin is warm and dry. No rash noted. No erythema.  Psychiatric: She has a normal mood and affect. Her behavior is normal.  Nursing note and vitals reviewed.   ED Course  Procedures (including critical care time) Labs Review Labs Reviewed  CBC WITH DIFFERENTIAL/PLATELET - Abnormal; Notable for the following:    WBC 3.9 (*)    Hemoglobin 11.7 (*)    HCT 35.9 (*)    All other components within normal limits  COMPREHENSIVE METABOLIC PANEL - Abnormal; Notable for the following:    Potassium 3.4 (*)    Total Bilirubin 1.4 (*)    GFR calc non Af Amer 59 (*)    All other components within normal limits  LIPASE, BLOOD - Abnormal; Notable for the following:    Lipase 21 (*)    All other components within normal limits  URINALYSIS, ROUTINE W REFLEX MICROSCOPIC - Abnormal; Notable for the  following:    APPearance TURBID (*)    Hgb urine dipstick LARGE (*)    Protein, ur 30 (*)    Leukocytes, UA LARGE (*)    All other components within normal limits  URINE MICROSCOPIC-ADD ON - Abnormal; Notable for the following:    Bacteria, UA FEW (*)    All other components within normal limits  URINE CULTURE    Imaging Review No results found.   EKG Interpretation None      MDM   Final diagnoses:  Foley catheter problem, initial encounter  Right lumbar radiculopathy    Here complaining of urine leakage around her catheter that started today.  Patient has had an indwelling Foley catheter since August. She states this catheter was last changed about 2 months ago. Yesterday she was fine and today noticed a severe burning sensation in her urethra and she has been leaking urine all day. She denies fever, nausea, vomiting, and no change or foul smell of her urine recently.  On exam patient has clear yellow urine leaking around the catheter without any evidence of vaginal yeast infection or blood present. UA is consistent with too numerous to count white blood cells but only few bacteria. Patient has no complaints of urinary tract infection. This could be chronic colonization. Urine culture sent. Will change Foley catheter which I feel will relieve patient's urine leakage.  Secondly patient is complaining of chronic right lower leg pain that has been chronic ongoing for months. She sees Dr. Nelva Bush for this.  Several weeks ago she got a steroid injection in her back which has not helped with her leg pain. She is taking oxycodone at home which has not been helping her pain. She denies any weakness or changes in the pain but states that is persistent and interferes with her daily activity. Discussed with patient following up with Dr. ramus for possible epidural block for pain control versus gabapentin or Lyrica for the nerve pain.  7:54 PM New foley placed and pt had >760mL output and no  further leakage around the cath.  Pt also after pain medication has no pain in the leg.  Will d/c home to f/u with PCP and Dr.  Ramos.  Also urine culture sent and pt will be contacted if needs abx.  Blanchie Dessert, MD 08/10/14 1955  Blanchie Dessert, MD 08/10/14 2251

## 2014-08-13 LAB — URINE CULTURE

## 2014-08-14 ENCOUNTER — Telehealth (HOSPITAL_BASED_OUTPATIENT_CLINIC_OR_DEPARTMENT_OTHER): Payer: Self-pay | Admitting: Emergency Medicine

## 2014-08-14 NOTE — Telephone Encounter (Signed)
Spoke with pt, asymptomatic, feels much betterPost ED Visit - Positive Culture Follow-up  Culture report reviewed by antimicrobial stewardship pharmacist: []  Wes Dulaney, Pharm.D., BCPS [x]  Heide Guile, Pharm.D., BCPS []  Alycia Rossetti, Pharm.D., BCPS []  [place], Pharm.D., BCPS, AAHIVP []  Legrand Como, Pharm.D., BCPS, AAHIVP []  Isac Sarna, Pharm.D., BCPS  Positive urine culture Proteus Treated with none, asymptomaticand no further patient follow-up is required at this time.  Hazle Nordmann 08/14/2014, 11:17 AM

## 2014-08-19 ENCOUNTER — Other Ambulatory Visit: Payer: Self-pay | Admitting: Physician Assistant

## 2014-08-19 DIAGNOSIS — Z853 Personal history of malignant neoplasm of breast: Secondary | ICD-10-CM

## 2014-08-21 ENCOUNTER — Ambulatory Visit: Payer: Medicare Other | Admitting: Pharmacist Clinician (PhC)/ Clinical Pharmacy Specialist

## 2014-08-22 ENCOUNTER — Ambulatory Visit (INDEPENDENT_AMBULATORY_CARE_PROVIDER_SITE_OTHER): Payer: Medicare Other | Admitting: Pharmacist Clinician (PhC)/ Clinical Pharmacy Specialist

## 2014-08-22 DIAGNOSIS — Z7901 Long term (current) use of anticoagulants: Secondary | ICD-10-CM

## 2014-08-22 DIAGNOSIS — I480 Paroxysmal atrial fibrillation: Secondary | ICD-10-CM

## 2014-08-22 LAB — POCT INR: INR: 1.9

## 2014-09-01 ENCOUNTER — Emergency Department (HOSPITAL_COMMUNITY): Payer: Medicare Other

## 2014-09-01 ENCOUNTER — Encounter (HOSPITAL_COMMUNITY): Payer: Self-pay | Admitting: *Deleted

## 2014-09-01 ENCOUNTER — Emergency Department (HOSPITAL_COMMUNITY)
Admission: EM | Admit: 2014-09-01 | Discharge: 2014-09-01 | Disposition: A | Discharge status: Home / Self Care | Payer: Medicare Other | Attending: Emergency Medicine | Admitting: Emergency Medicine

## 2014-09-01 DIAGNOSIS — M1990 Unspecified osteoarthritis, unspecified site: Secondary | ICD-10-CM | POA: Insufficient documentation

## 2014-09-01 DIAGNOSIS — H409 Unspecified glaucoma: Secondary | ICD-10-CM | POA: Diagnosis not present

## 2014-09-01 DIAGNOSIS — J45909 Unspecified asthma, uncomplicated: Secondary | ICD-10-CM | POA: Diagnosis not present

## 2014-09-01 DIAGNOSIS — I10 Essential (primary) hypertension: Secondary | ICD-10-CM | POA: Diagnosis not present

## 2014-09-01 DIAGNOSIS — N390 Urinary tract infection, site not specified: Secondary | ICD-10-CM

## 2014-09-01 DIAGNOSIS — T83038A Leakage of other indwelling urethral catheter, initial encounter: Secondary | ICD-10-CM | POA: Diagnosis not present

## 2014-09-01 DIAGNOSIS — Z79899 Other long term (current) drug therapy: Secondary | ICD-10-CM | POA: Insufficient documentation

## 2014-09-01 DIAGNOSIS — Z7901 Long term (current) use of anticoagulants: Secondary | ICD-10-CM | POA: Insufficient documentation

## 2014-09-01 DIAGNOSIS — Z8719 Personal history of other diseases of the digestive system: Secondary | ICD-10-CM | POA: Insufficient documentation

## 2014-09-01 DIAGNOSIS — Y846 Urinary catheterization as the cause of abnormal reaction of the patient, or of later complication, without mention of misadventure at the time of the procedure: Secondary | ICD-10-CM | POA: Insufficient documentation

## 2014-09-01 DIAGNOSIS — Z98811 Dental restoration status: Secondary | ICD-10-CM | POA: Insufficient documentation

## 2014-09-01 DIAGNOSIS — Z8639 Personal history of other endocrine, nutritional and metabolic disease: Secondary | ICD-10-CM | POA: Insufficient documentation

## 2014-09-01 DIAGNOSIS — Z8701 Personal history of pneumonia (recurrent): Secondary | ICD-10-CM | POA: Insufficient documentation

## 2014-09-01 DIAGNOSIS — Z87891 Personal history of nicotine dependence: Secondary | ICD-10-CM | POA: Insufficient documentation

## 2014-09-01 LAB — COMPREHENSIVE METABOLIC PANEL
ALBUMIN: 4 g/dL (ref 3.5–5.0)
ALK PHOS: 97 U/L (ref 38–126)
ALT: 20 U/L (ref 14–54)
AST: 33 U/L (ref 15–41)
Anion gap: 11 (ref 5–15)
BUN: 22 mg/dL — AB (ref 6–20)
CO2: 28 mmol/L (ref 22–32)
CREATININE: 1.03 mg/dL — AB (ref 0.44–1.00)
Calcium: 9.6 mg/dL (ref 8.9–10.3)
Chloride: 97 mmol/L — ABNORMAL LOW (ref 101–111)
GFR calc non Af Amer: 48 mL/min — ABNORMAL LOW (ref >60)
GFR, EST AFRICAN AMERICAN: 55 mL/min — AB (ref >60)
GLUCOSE: 105 mg/dL — AB (ref 65–99)
POTASSIUM: 3.7 mmol/L (ref 3.5–5.1)
Sodium: 136 mmol/L (ref 135–145)
TOTAL PROTEIN: 7.5 g/dL (ref 6.5–8.1)
Total Bilirubin: 0.9 mg/dL (ref 0.3–1.2)

## 2014-09-01 LAB — URINALYSIS, ROUTINE W REFLEX MICROSCOPIC
Bilirubin Urine: NEGATIVE
GLUCOSE, UA: NEGATIVE mg/dL
KETONES UR: NEGATIVE mg/dL
Nitrite: POSITIVE — AB
PROTEIN: NEGATIVE mg/dL
Specific Gravity, Urine: 1.01 (ref 1.005–1.030)
UROBILINOGEN UA: 0.2 mg/dL (ref 0.0–1.0)
pH: 8 (ref 5.0–8.0)

## 2014-09-01 LAB — URINE MICROSCOPIC-ADD ON

## 2014-09-01 LAB — LIPASE, BLOOD: Lipase: 16 U/L — ABNORMAL LOW (ref 22–51)

## 2014-09-01 LAB — CBC WITH DIFFERENTIAL/PLATELET
BASOS ABS: 0 10*3/uL (ref 0.0–0.1)
Basophils Relative: 0 % (ref 0–1)
EOS ABS: 0.1 10*3/uL (ref 0.0–0.7)
Eosinophils Relative: 1 % (ref 0–5)
HEMATOCRIT: 35.5 % — AB (ref 36.0–46.0)
Hemoglobin: 11.5 g/dL — ABNORMAL LOW (ref 12.0–15.0)
LYMPHS PCT: 14 % (ref 12–46)
Lymphs Abs: 0.8 10*3/uL (ref 0.7–4.0)
MCH: 26.8 pg (ref 26.0–34.0)
MCHC: 32.4 g/dL (ref 30.0–36.0)
MCV: 82.8 fL (ref 78.0–100.0)
MONO ABS: 0.2 10*3/uL (ref 0.1–1.0)
MONOS PCT: 3 % (ref 3–12)
NEUTROS PCT: 82 % — AB (ref 43–77)
Neutro Abs: 4.5 10*3/uL (ref 1.7–7.7)
Platelets: 212 10*3/uL (ref 150–400)
RBC: 4.29 MIL/uL (ref 3.87–5.11)
RDW: 13.7 % (ref 11.5–15.5)
WBC: 5.6 10*3/uL (ref 4.0–10.5)

## 2014-09-01 LAB — I-STAT CG4 LACTIC ACID, ED
Lactic Acid, Venous: 1 mmol/L (ref 0.5–2.0)
Lactic Acid, Venous: 1.01 mmol/L (ref 0.5–2.0)

## 2014-09-01 MED ORDER — MORPHINE SULFATE 4 MG/ML IJ SOLN
4.0000 mg | Freq: Once | INTRAMUSCULAR | Status: AC
  Administered 2014-09-01: 4 mg via INTRAVENOUS
  Filled 2014-09-01: qty 1

## 2014-09-01 MED ORDER — CIPROFLOXACIN HCL 500 MG PO TABS: 500.0000 mg | ORAL_TABLET | Freq: Two times a day (BID) | ORAL | Status: AC

## 2014-09-01 MED ORDER — CIPROFLOXACIN HCL 500 MG PO TABS
500.0000 mg | ORAL_TABLET | Freq: Once | ORAL | Status: AC
  Administered 2014-09-01: 500 mg via ORAL
  Filled 2014-09-01: qty 1

## 2014-09-01 MED ORDER — IOHEXOL 300 MG/ML  SOLN
100.0000 mL | Freq: Once | INTRAMUSCULAR | Status: AC | PRN
  Administered 2014-09-01: 100 mL via INTRAVENOUS

## 2014-09-01 NOTE — ED Notes (Signed)
Placed 68fr per order Dr. Tomi Bamberger, pt tolerated well.

## 2014-09-01 NOTE — Discharge Instructions (Signed)
Please take half of your normal amount of Coumadin while you are on the antibiotics. You may resume your normal Coumadin dosage once the antibiotics are completed. Please follow-up with your primary physician in the next several days to ensure improvement of symptoms. If you have any new symptoms, please return to the emergency department.   Catheter-Associated Urinary Tract Infection FAQs WHAT IS "CATHETER-ASSOCIATED" URINARY TRACT INFECTION? A urinary tract infection (also called "UTI") is an infection in the urinary system, which includes the bladder (which stores the urine) and the kidneys (which filter the blood to make urine). Germs (for example, bacteria or yeasts) do not normally live in these areas; but if germs are introduced, an infection can occur. If you have a urinary catheter, germs can travel along the catheter and cause an infection in your bladder or your kidney; in that case it is called a catheter-associated urinary tract infection (or "CA-UTI").  WHAT IS A URINARY CATHETER? A urinary catheter is a thin tube placed in the bladder to drain urine. Urine drains through the tube into a bag that collects the urine. A urinary  catheter may be used:  If you are not able to urinate on your own.  To measure the amount of urine that you make, for example, during intensive care.  During and after some types of surgery.  During some tests of the kidneys and bladder . People with urinary catheters have a much higher chance of getting a urinary tract infection than people who don't have a catheter. HOW DO I GET A CATHETER-ASSOCIATED URINARY TRACT INFECTION (CA-UTI)? If germs enter the urinary tract, they may cause an infection. Many of the germs that cause a catheter-associated urinary tract infection are common germs found in your intestines that do not usually cause an infection there. Germs can enter the urinary tract when the catheter is being put in or while the catheter remains in the  bladder.  WHAT ARE THE SYMPTOMS OF A URINARY TRACT INFECTION?  Some of the common symptoms of a urinary tract infection are:  Burning or pain in the lower abdomen (that is, below the stomach).  Fever.  Bloody urine may be a sign of infection, but is also caused by other problems .  Burning during urination or an increase in the frequency of urination after the catheter is removed. Sometimes people with catheter-associated urinary tract infections do not have these symptoms of infection. CAN CATHETER-ASSOCIATED URINARY TRACT INFECTIONS BE TREATED? Yes, most catheter-associated urinary tract infections can be treated with antibiotics and removal or change of the catheter. Your doctor will determine which antibiotic is best for you.  WHAT ARE SOME OF THE THINGS THAT HOSPITALS ARE DOING TO PREVENT CATHETER-ASSOCIATED URINARY TRACT INFECTIONS? To prevent urinary tract infections, doctors and nurses take the following actions.  Catheter insertion  Catheters are put in only when necessary and they are removed as soon as possible.  Only properly trained persons insert catheters using sterile ("clean") technique.  The skin in the area where the catheter will be inserted is cleaned before inserting the catheter.  Other methods to drain the urine are sometimes used, such as:  External catheters in men (these look like condoms and are placed over the penis rather than into the penis)  Putting a temporary catheter in to drain the urine and removing it right away. This is called intermittent urethral catheterization. Catheter care  Healthcare providers clean their hands by washing them with soap and water or using an alcohol-based  hand rub before and after touching your catheter.  If you do not see your providers clean their hands, please ask them to do so.  Avoid disconnecting the catheter and drain tube. This helps to prevent germs from getting into the catheter tube.  The catheter is  secured to the leg to prevent pulling on the catheter.  Avoid twisting or kinking the catheter.  Keep the bag lower than the bladder to prevent urine from backflowing to the bladder.  Empty the bag regularly. The drainage spout should not touch anything while emptying the bag. WHAT CAN I DO TO HELP PREVENT CATHETER-ASSOCIATED URINARY TRACT INFECTIONS IF I HAVE A CATHETER?  Always clean your hands before and after doing catheter care.  Always keep your urine bag below the level of your bladder.  Do not tug or pull on the tubing.  Do not twist or kink the catheter tubing.  Ask your healthcare provider each day if you still need the catheter. WHAT DO I NEED TO DO WHEN I [place]?  If you will be going home with a catheter, your doctor or nurse should explain everything you need to know about taking care of the catheter. Make sure you understand how to care for it before you leave the hospital.  If you develop any of the symptoms of a urinary tract infection, such as burning or pain in the lower abdomen, fever, or an increase in the frequency of urination, contact your doctor or nurse immediately.  Before you go home, make sure you know who to contact if you have questions or problems after you get home. If you have questions, please ask your doctor or nurse. Developed and co-sponsored by [employer] for [place] [phone]); Infectious Diseases Society of [place] (IDSA); [employer] [place]; Association for Professionals in Infection Control and Epidemiology (APIC); Center for Disease Control (CDC); and The Joint Commission Document Released: 12/15/2011 Document Reviewed: 12/15/2011 [facility] Patient Information 2015 [place]. This information is not intended to replace advice given to you by your health care provider. Make sure you discuss any questions you have with your health care provider.

## 2014-09-01 NOTE — ED Provider Notes (Signed)
Pt presents with complaints of abdominal pain and issues with her foley catheter.  Abdomen does feel distended inferiorly.  Will replace foley.  CT scan for other acute findings.   I saw and evaluated the patient, reviewed the resident's note and I agree with the findings and plan.    Dorie Rank, MD 09/04/14 [phone]

## 2014-09-01 NOTE — ED Provider Notes (Signed)
CSN: [id number]     Arrival date & time 09/01/14  1524 History   First MD Initiated Contact with Patient 09/01/14 1531     Chief Complaint  Patient presents with  . Abdominal Pain   Shalon B Posada is a 81 y.o. female with a past medical history significant for GERD, breast cancer, Chronic indwelling Foley catheter for the last 10 months recently changed several weeks ago for leakage who presents with abdominal pain. The patient reports that for the last 4 hours, the patient has had severe abdominal pain. The patient describes the pain is primarily located in her lower pelvis and lower abdomen. The patient describes the pain as 10 out of 10 in severity however, shortly after arrival, the patient's Foley was removed and that improved her pain to an 8 out of 10 in severity. The patient denies any other symptoms including denying fevers, chills, chest pain, shortness of breath, nausea, vomiting, constipation, diarrhea, dysuria.  The patient denies any falls, trauma and says that she is able to get around on her own.  On arrival, the patient's normothermic, has a normal heart rate, unremarkable respiratory rate, and is satting 97% on room air. The patient is mildly hypertensive on arrival. Shortly after arrival, the patient had her Foley catheter removed and will be given pain medicine. Plan to replace the Foley catheter during her visit.    (Consider location/radiation/quality/duration/timing/severity/associated sxs/prior Treatment) Patient is a 81 y.o. female presenting with abdominal pain. The history is provided by the patient and medical records. No language interpreter was used.  Abdominal Pain Pain location:  Suprapubic Pain quality: cramping, sharp and tearing   Pain radiates to:  Groin Pain severity:  Severe Onset quality:  Sudden Duration:  4 hours Timing:  Constant Progression:  Unchanged Chronicity:  New Context: not trauma   Context comment:   indwelling Foley catheter Relieved by:   Nothing Worsened by:  Nothing tried Ineffective treatments:  None tried Associated symptoms: no anorexia, no chest pain, no chills, no constipation, no cough, no diarrhea, no dysuria, no fever, no nausea, no shortness of breath and no vomiting   Risk factors: being elderly     Past Medical History  Diagnosis Date  . Hypertension   . Asthma   . Glaucoma   . Incontinence   . Wears dentures   . Wears glasses   . Thyroid disease     "something years ago"  . GERD (gastroesophageal reflux disease)   . Cancer     rt breast  . Edema leg   . Fast breathing     "because of pill"  . Pneumonia     h/o younger  . Arthritis     "all over"  . S/P total knee arthroplasty   . Glaucoma   . Swelling     bilateral feet/ legs, more left.   Past Surgical History  Procedure Laterality Date  . Total hip arthroplasty Bilateral   . Shoulder surgery Left   . Appendectomy    . Carpal tunnel release Bilateral   . Tonsillectomy    . Colonoscopy    . Abdominal hysterectomy    . Eye surgery      both cataracts  . Breast biopsy  04/27/2011    Procedure: BREAST BIOPSY WITH NEEDLE LOCALIZATION;  Surgeon: Edward Jolly, MD;  Location: [place];  Service: General;  Laterality: Right;  right needle localized breast lumpectomy   . Breast lumpectomy  04/27/11  right breast by [facility]  . Total knee arthroplasty Right 02/19/2013    Procedure: RIGHT TOTAL KNEE ARTHROPLASTY;  Surgeon: Gearlean Alf, MD;  Location: [facility];  Service: Orthopedics;  Laterality: Right;  . Total knee arthroplasty Left 10/15/2013    Procedure: LEFT TOTAL KNEE ARTHROPLASTY;  Surgeon: Gearlean Alf, MD;  Location: [facility];  Service: Orthopedics;  Laterality: Left;  . Cardioversion N/A 02/01/2014    Procedure: CARDIOVERSION;  Surgeon: Candee Furbish, MD;  Location: [facility] ENDOSCOPY;  Service: Cardiovascular;  Laterality: N/A;  . Tee without cardioversion N/A 02/01/2014    Procedure: TRANSESOPHAGEAL ECHOCARDIOGRAM  (TEE);  Surgeon: Candee Furbish, MD;  Location: [facility] ENDOSCOPY;  Service: Cardiovascular;  Laterality: N/A;   Family History  Problem Relation Age of Onset  . Heart disease Mother   . Stroke Father    History  Substance Use Topics  . Smoking status: Former Smoker -- 0.00 packs/day for 30 years    Types: Cigarettes    Quit date: 04/21/1975  . Smokeless tobacco: Never Used  . Alcohol Use: Yes     Comment: occasionally   OB History    No data available     Review of Systems  Constitutional: Negative for fever, chills and appetite change.  HENT: Negative for congestion and rhinorrhea.   Eyes: Negative for visual disturbance.  Respiratory: Negative for cough, chest tightness, shortness of breath, wheezing and stridor.   Cardiovascular: Negative for chest pain and palpitations.  Gastrointestinal: Positive for abdominal pain. Negative for nausea, vomiting, diarrhea, constipation and anorexia.  Genitourinary: Positive for difficulty urinating and pelvic pain. Negative for dysuria and flank pain.  Musculoskeletal: Negative for back pain, neck pain and neck stiffness.  Skin: Negative for rash and wound.  Neurological: Negative for headaches.  Psychiatric/Behavioral: Negative for agitation.  All other systems reviewed and are negative.     Allergies  Lactose intolerance (gi); Milk-related compounds; and Sulfa antibiotics  Home Medications   Prior to Admission medications   Medication Sig Start Date End Date Taking? Authorizing Provider  anastrozole (ARIMIDEX) 1 MG tablet Take 1 tablet (1 mg total) by mouth daily. 06/20/14   Nicholas Lose, MD  flurazepam (DALMANE) 15 MG capsule Take 15 mg by mouth at bedtime as needed for sleep.  06/19/14   Historical Provider, MD  furosemide (LASIX) 20 MG tablet Take 20 mg by mouth 2 (two) times daily.     Historical Provider, MD  latanoprost (XALATAN) 0.005 % ophthalmic solution Place 1 drop into both eyes at bedtime.    Historical Provider, MD    metoprolol (LOPRESSOR) 50 MG tablet Take 1.5 tablets (75 mg total) by mouth 2 (two) times daily. 01/24/14   Erlene Quan, PA-C  oxyCODONE-acetaminophen (PERCOCET/ROXICET) 5-325 MG per tablet Take by mouth 3 (three) times daily as needed. For pain 08/08/14   Historical Provider, MD  tamsulosin (FLOMAX) 0.4 MG CAPS capsule Take 0.4 mg by mouth daily.    Historical Provider, MD  warfarin (COUMADIN) 5 MG tablet Take 1 tablet (5 mg total) by mouth daily. 07/05/14   Troy Sine, MD   BP 189/86 mmHg  Pulse 79  Temp(Src) 97.8 F (36.6 C) (Oral)  Resp 18  Ht 5' 6.75" (1.695 m)  Wt 143 lb (64.864 kg)  BMI 22.58 kg/m2  SpO2 97% Physical Exam  Constitutional: She is oriented to person, place, and time. She appears well-developed and well-nourished. No distress.  HENT:  Head: Normocephalic.  Mouth/Throat: No oropharyngeal exudate.  Eyes: EOM are  normal. Pupils are equal, round, and reactive to light. No scleral icterus.  Neck: Neck supple.  Cardiovascular: Normal rate, regular rhythm, normal heart sounds and intact distal pulses.   No murmur heard. Pulmonary/Chest: Effort normal and breath sounds normal. No stridor. No respiratory distress. She has no wheezes. She exhibits no tenderness.  Abdominal: Soft. Bowel sounds are normal. There is tenderness in the suprapubic area. There is no rigidity, no rebound and no guarding.    Musculoskeletal: She exhibits no tenderness.  Neurological: She is alert and oriented to person, place, and time. She displays normal reflexes. She exhibits normal muscle tone.  Skin: Skin is warm. She is not diaphoretic. No pallor.  Psychiatric: She has a normal mood and affect.  Nursing note and vitals reviewed.   ED Course  Procedures (including critical care time) Labs Review Labs Reviewed - No data to display  Imaging Review No results found.   EKG Interpretation None      MDM   Nikky B Bhatt is a 80 y.o. female with a past medical history significant  for GERD, breast cancer, Chronic indwelling Foley catheter for the last 10 months recently changed several weeks ago for leakage who presents with abdominal pain. Given the patient's leaking Foley and her abdominal pain there is concern for possible Foley malfunction. The Foley catheter was removed by nursing shortly after arrival causing a mild improvement in her pain. The Foley catheter will be replaced.  Emergency evaluate for possible etiology of her symptoms, the patient will have diagnostic laboratory and imaging studies for her abdomen. The patient was given pain medication.    the patient's diagnostic workup results are seen above. The patient had a CT scan which revealed mild bladder wall thickening concerning for cystitis. The patient had confirmation of appropriate Foley placement after being replaced in the ED. The patient was informed of a soft tissue mass seen on her adrenal gland to be followed by her PCP.   The patient's diagnostic laboratory testing revealed a normal lactic acid, a CBC with a normal white blood cell count and mild anemia of 11.5, and a CMP which revealed decreased GFR but a creatinine of 1.03. The patient's lipase was normal, and her urine revealed evidence of urinary tract infection.  A chart review from her recent urinary cultures showed sensitivity to ciprofloxacin. The patient was given a dose of Cipro and discharged home with a prescription. The patient will follow-up with her PCP in the next several days for further management of her symptoms. Next  The patient had no other problems while in the emergency department and was discharged in good condition.  This patient was seen with Dr. Tomi Bamberger, emergency medicine attending.    Final diagnoses:  UTI (lower urinary tract infection)      Antony Blackbird, MD 09/01/14 2320

## 2014-09-01 NOTE — ED Notes (Signed)
Pt in CT, family in room

## 2014-09-01 NOTE — ED Notes (Signed)
Pt had foley catheter placed 3 months ago.  It was changed here 2 weeks ago.  Pt now c/o acute onset pelvic pain and urinary incontinence x several hours.  Pt appears in great pain.

## 2014-09-01 NOTE — ED Notes (Signed)
Pt reports getting more uncomfortable at present  [place]

## 2014-09-03 ENCOUNTER — Ambulatory Visit
Admission: RE | Admit: 2014-09-03 | Discharge: 2014-09-03 | Disposition: A | Discharge status: Home / Self Care | Payer: Medicare Other | Source: Ambulatory Visit | Attending: Physician Assistant | Admitting: Physician Assistant

## 2014-09-03 DIAGNOSIS — Z853 Personal history of malignant neoplasm of breast: Secondary | ICD-10-CM

## 2014-09-03 NOTE — ED Notes (Signed)
rx added for pyridium 200 mf # 9   Ed res ordered.  rx called into  [facility] on [address]

## 2014-09-05 ENCOUNTER — Ambulatory Visit (INDEPENDENT_AMBULATORY_CARE_PROVIDER_SITE_OTHER): Payer: Medicare Other | Admitting: Pharmacist Clinician (PhC)/ Clinical Pharmacy Specialist

## 2014-09-05 DIAGNOSIS — Z7901 Long term (current) use of anticoagulants: Secondary | ICD-10-CM

## 2014-09-05 DIAGNOSIS — I480 Paroxysmal atrial fibrillation: Secondary | ICD-10-CM | POA: Diagnosis not present

## 2014-09-05 LAB — POCT INR: INR: 1.9

## 2014-09-24 ENCOUNTER — Ambulatory Visit: Payer: Medicare Other | Admitting: Pharmacist Clinician (PhC)/ Clinical Pharmacy Specialist

## 2014-11-07 ENCOUNTER — Telehealth: Payer: Self-pay | Admitting: Pharmacist Clinician (PhC)/ Clinical Pharmacy Specialist

## 2014-11-08 NOTE — Telephone Encounter (Signed)
Close encounter 

## 2014-11-09 ENCOUNTER — Other Ambulatory Visit: Payer: Self-pay | Admitting: Hematology and Oncology

## 2014-11-11 NOTE — Telephone Encounter (Signed)
Last ov 05/28/14.  Next ov 06/03/15.  Chart reviewed

## 2014-11-14 ENCOUNTER — Ambulatory Visit (INDEPENDENT_AMBULATORY_CARE_PROVIDER_SITE_OTHER): Payer: Medicare Other | Admitting: Pharmacist Clinician (PhC)/ Clinical Pharmacy Specialist

## 2014-11-14 DIAGNOSIS — I480 Paroxysmal atrial fibrillation: Secondary | ICD-10-CM

## 2014-11-14 DIAGNOSIS — Z7901 Long term (current) use of anticoagulants: Secondary | ICD-10-CM

## 2014-11-14 LAB — POCT INR: INR: 1.1

## 2014-11-26 ENCOUNTER — Ambulatory Visit (INDEPENDENT_AMBULATORY_CARE_PROVIDER_SITE_OTHER): Payer: Medicare Other | Admitting: *Deleted

## 2014-11-26 DIAGNOSIS — Z7901 Long term (current) use of anticoagulants: Secondary | ICD-10-CM | POA: Diagnosis not present

## 2014-11-26 DIAGNOSIS — I480 Paroxysmal atrial fibrillation: Secondary | ICD-10-CM | POA: Diagnosis not present

## 2014-11-26 LAB — POCT INR: INR: 1.6

## 2014-11-27 ENCOUNTER — Ambulatory Visit: Payer: Medicare Other | Admitting: Pharmacist Clinician (PhC)/ Clinical Pharmacy Specialist

## 2014-12-05 ENCOUNTER — Other Ambulatory Visit: Payer: Self-pay | Admitting: Cardiovascular Disease

## 2014-12-06 ENCOUNTER — Other Ambulatory Visit: Payer: Self-pay | Admitting: Hematology and Oncology

## 2014-12-06 ENCOUNTER — Ambulatory Visit (INDEPENDENT_AMBULATORY_CARE_PROVIDER_SITE_OTHER): Payer: Medicare Other | Admitting: Pharmacist Clinician (PhC)/ Clinical Pharmacy Specialist

## 2014-12-06 DIAGNOSIS — C50119 Malignant neoplasm of central portion of unspecified female breast: Secondary | ICD-10-CM

## 2014-12-06 DIAGNOSIS — Z7901 Long term (current) use of anticoagulants: Secondary | ICD-10-CM

## 2014-12-06 DIAGNOSIS — I480 Paroxysmal atrial fibrillation: Secondary | ICD-10-CM | POA: Diagnosis not present

## 2014-12-06 LAB — POCT INR: INR: 1.3

## 2014-12-13 ENCOUNTER — Ambulatory Visit (INDEPENDENT_AMBULATORY_CARE_PROVIDER_SITE_OTHER): Payer: Medicare Other | Admitting: Pharmacist Clinician (PhC)/ Clinical Pharmacy Specialist

## 2014-12-13 DIAGNOSIS — Z7901 Long term (current) use of anticoagulants: Secondary | ICD-10-CM | POA: Diagnosis not present

## 2014-12-13 DIAGNOSIS — I480 Paroxysmal atrial fibrillation: Secondary | ICD-10-CM

## 2014-12-13 LAB — POCT INR: INR: 1.7

## 2014-12-21 ENCOUNTER — Encounter (HOSPITAL_COMMUNITY): Payer: Self-pay | Admitting: Emergency Medicine

## 2014-12-21 ENCOUNTER — Emergency Department (HOSPITAL_COMMUNITY)
Admission: EM | Admit: 2014-12-21 | Discharge: 2014-12-21 | Disposition: A | Discharge status: Home / Self Care | Payer: Medicare Other | Attending: Emergency Medicine | Admitting: Emergency Medicine

## 2014-12-21 DIAGNOSIS — M1990 Unspecified osteoarthritis, unspecified site: Secondary | ICD-10-CM | POA: Insufficient documentation

## 2014-12-21 DIAGNOSIS — Z8701 Personal history of pneumonia (recurrent): Secondary | ICD-10-CM | POA: Insufficient documentation

## 2014-12-21 DIAGNOSIS — I10 Essential (primary) hypertension: Secondary | ICD-10-CM | POA: Diagnosis not present

## 2014-12-21 DIAGNOSIS — J45909 Unspecified asthma, uncomplicated: Secondary | ICD-10-CM | POA: Diagnosis not present

## 2014-12-21 DIAGNOSIS — N390 Urinary tract infection, site not specified: Secondary | ICD-10-CM | POA: Diagnosis not present

## 2014-12-21 DIAGNOSIS — H409 Unspecified glaucoma: Secondary | ICD-10-CM | POA: Insufficient documentation

## 2014-12-21 DIAGNOSIS — Z7901 Long term (current) use of anticoagulants: Secondary | ICD-10-CM | POA: Diagnosis not present

## 2014-12-21 DIAGNOSIS — E079 Disorder of thyroid, unspecified: Secondary | ICD-10-CM | POA: Diagnosis not present

## 2014-12-21 DIAGNOSIS — Z87891 Personal history of nicotine dependence: Secondary | ICD-10-CM | POA: Insufficient documentation

## 2014-12-21 DIAGNOSIS — Z853 Personal history of malignant neoplasm of breast: Secondary | ICD-10-CM | POA: Insufficient documentation

## 2014-12-21 DIAGNOSIS — K219 Gastro-esophageal reflux disease without esophagitis: Secondary | ICD-10-CM | POA: Insufficient documentation

## 2014-12-21 DIAGNOSIS — Z79899 Other long term (current) drug therapy: Secondary | ICD-10-CM | POA: Diagnosis not present

## 2014-12-21 DIAGNOSIS — R350 Frequency of micturition: Secondary | ICD-10-CM | POA: Diagnosis present

## 2014-12-21 LAB — URINALYSIS, ROUTINE W REFLEX MICROSCOPIC
Bilirubin Urine: NEGATIVE
Glucose, UA: NEGATIVE mg/dL
KETONES UR: NEGATIVE mg/dL
NITRITE: POSITIVE — AB
PROTEIN: 100 mg/dL — AB
Specific Gravity, Urine: 1.016 (ref 1.005–1.030)
Urobilinogen, UA: 1 mg/dL (ref 0.0–1.0)
pH: 7 (ref 5.0–8.0)

## 2014-12-21 LAB — BASIC METABOLIC PANEL
ANION GAP: 7 (ref 5–15)
BUN: 10 mg/dL (ref 6–20)
CALCIUM: 9.2 mg/dL (ref 8.9–10.3)
CO2: 28 mmol/L (ref 22–32)
Chloride: 100 mmol/L — ABNORMAL LOW (ref 101–111)
Creatinine, Ser: 0.76 mg/dL (ref 0.44–1.00)
Glucose, Bld: 85 mg/dL (ref 65–99)
POTASSIUM: 3.7 mmol/L (ref 3.5–5.1)
SODIUM: 135 mmol/L (ref 135–145)

## 2014-12-21 LAB — URINE MICROSCOPIC-ADD ON

## 2014-12-21 MED ORDER — CIPROFLOXACIN HCL 500 MG PO TABS: 500.0000 mg | ORAL_TABLET | Freq: Two times a day (BID) | ORAL | Status: DC

## 2014-12-21 NOTE — ED Provider Notes (Signed)
CSN: [id number]     Arrival date & time 12/21/14  1333 History   First MD Initiated Contact with Patient 12/21/14 [phone]     Chief Complaint  Patient presents with  . Urinary Frequency     (Consider location/radiation/quality/duration/timing/severity/associated sxs/prior Treatment) Patient is a 81 y.o. female presenting with frequency.  Urinary Frequency This is a new problem. The current episode started yesterday. The problem occurs intermittently. The problem has been unchanged. Associated symptoms include arthralgias and urinary symptoms. Pertinent negatives include no abdominal pain, anorexia, chest pain, chills, fever, headaches, myalgias, nausea, neck pain, numbness, swollen glands, vomiting or weakness. Associated symptoms comments: Associated symptoms include intermittent dysuria, malodorous urine, and vaginal burning.  Denies flank pain, vaginal itching/bleeding, hematuria, or changes in bowel habits. Natasha Fletcher is a 81 y.o. female with PMH significant for incontinence with permanent foley catheter placement who presents with dysuria and increased urinary frequency with leakage around her foley catheter that began last night.  She has had UTIs in the past, and this episode feels similar.  She was seen in February for similar symptoms and diagnosed with UTI.  No recent med changes.  No recent abx use.     Past Medical History  Diagnosis Date  . Hypertension   . Asthma   . Glaucoma   . Incontinence   . Wears dentures   . Wears glasses   . Thyroid disease     "something years ago"  . GERD (gastroesophageal reflux disease)   . Cancer     rt breast  . Edema leg   . Fast breathing     "because of pill"  . Pneumonia     h/o younger  . Arthritis     "all over"  . S/P total knee arthroplasty   . Glaucoma   . Swelling     bilateral feet/ legs, more left.   Past Surgical History  Procedure Laterality Date  . Total hip arthroplasty Bilateral   . Shoulder surgery  Left   . Appendectomy    . Carpal tunnel release Bilateral   . Tonsillectomy    . Colonoscopy    . Abdominal hysterectomy    . Eye surgery      both cataracts  . Breast biopsy  04/27/2011    Procedure: BREAST BIOPSY WITH NEEDLE LOCALIZATION;  Surgeon: Edward Jolly, MD;  Location: [place];  Service: General;  Laterality: Right;  right needle localized breast lumpectomy   . Breast lumpectomy  04/27/11    right breast by [facility]  . Total knee arthroplasty Right 02/19/2013    Procedure: RIGHT TOTAL KNEE ARTHROPLASTY;  Surgeon: Gearlean Alf, MD;  Location: [facility];  Service: Orthopedics;  Laterality: Right;  . Total knee arthroplasty Left 10/15/2013    Procedure: LEFT TOTAL KNEE ARTHROPLASTY;  Surgeon: Gearlean Alf, MD;  Location: [facility];  Service: Orthopedics;  Laterality: Left;  . Cardioversion N/A 02/01/2014    Procedure: CARDIOVERSION;  Surgeon: Candee Furbish, MD;  Location: [facility];  Service: Cardiovascular;  Laterality: N/A;  . Tee without cardioversion N/A 02/01/2014    Procedure: TRANSESOPHAGEAL ECHOCARDIOGRAM (TEE);  Surgeon: Candee Furbish, MD;  Location: [facility] ENDOSCOPY;  Service: Cardiovascular;  Laterality: N/A;   Family History  Problem Relation Age of Onset  . Heart disease Mother   . Stroke Father    Social History  Substance Use Topics  . Smoking status: Former Smoker -- 0.00 packs/day for 30 years  Types: Cigarettes    Quit date: 04/21/1975  . Smokeless tobacco: Never Used  . Alcohol Use: Yes     Comment: occasionally   OB History    No data available     Review of Systems  Constitutional: Negative for fever and chills.  Cardiovascular: Negative for chest pain.  Gastrointestinal: Negative for nausea, vomiting, abdominal pain and anorexia.  Genitourinary: Positive for dysuria, urgency and frequency. Negative for hematuria, flank pain, vaginal bleeding, vaginal discharge, difficulty urinating and vaginal pain.  Musculoskeletal:  Positive for back pain and arthralgias. Negative for myalgias and neck pain.  Neurological: Negative for weakness, numbness and headaches.  All other systems reviewed and are negative.     Allergies  Lactose intolerance (gi); Milk-related compounds; and Sulfa antibiotics  Home Medications   Prior to Admission medications   Medication Sig Start Date End Date Taking? Authorizing Zeb Rawl  anastrozole (ARIMIDEX) 1 MG tablet TAKE 1 TABLET (1 MG TOTAL) BY MOUTH DAILY. 12/06/14  Yes Nicholas Lose, MD  flurazepam (DALMANE) 15 MG capsule Take 15 mg by mouth at bedtime as needed for sleep.  06/19/14  Yes Historical Keddrick Wyne, MD  furosemide (LASIX) 20 MG tablet Take 20 mg by mouth 2 (two) times daily.    Yes Historical Tahjay Binion, MD  latanoprost (XALATAN) 0.005 % ophthalmic solution Place 1 drop into both eyes at bedtime.   Yes Historical Davida Falconi, MD  metoprolol (LOPRESSOR) 50 MG tablet Take 1.5 tablets (75 mg total) by mouth 2 (two) times daily. 01/24/14  Yes Luke K Kilroy, PA-C  oxybutynin (DITROPAN) 5 MG tablet Take 5 mg by mouth daily.   Yes Historical Jessilyn Catino, MD  oxyCODONE-acetaminophen (PERCOCET/ROXICET) 5-325 MG per tablet Take by mouth 3 (three) times daily as needed. For pain 08/08/14  Yes Historical Nichalas Coin, MD  tamsulosin (FLOMAX) 0.4 MG CAPS capsule Take 0.4 mg by mouth daily.   Yes Historical Drequan Ironside, MD  traMADol (ULTRAM) 50 MG tablet Take 50 mg by mouth every 6 (six) hours as needed for moderate pain.   Yes Historical Pier Bosher, MD  warfarin (COUMADIN) 5 MG tablet TAKE 1 TABLET (5 MG TOTAL) BY MOUTH DAILY. 12/05/14  Yes Troy Sine, MD   BP 152/71 mmHg  Pulse 79  Temp(Src) 97.7 F (36.5 C) (Oral)  Resp 17  Ht 5\' 5"  (1.651 m)  Wt 143 lb (64.864 kg)  BMI 23.80 kg/m2  SpO2 97% Physical Exam  Constitutional: She is oriented to person, place, and time. She appears well-developed and well-nourished.  HENT:  Head: Normocephalic and atraumatic.  Mouth/Throat: Oropharynx is clear and  moist.  Eyes: Conjunctivae are normal. Pupils are equal, round, and reactive to light.  Neck: Normal range of motion. Neck supple.  Cardiovascular: Normal rate, regular rhythm and normal heart sounds.   No murmur heard. Pulmonary/Chest: Effort normal and breath sounds normal. No respiratory distress. She has no wheezes. She has no rales.  Abdominal: Soft. Bowel sounds are normal. She exhibits no distension. There is tenderness in the suprapubic area. There is no rigidity, no guarding and no CVA tenderness.  Musculoskeletal: Normal range of motion.  Lymphadenopathy:    She has no cervical adenopathy.  Neurological: She is alert and oriented to person, place, and time. She has normal strength. No sensory deficit.  Skin: Skin is warm and dry.  Psychiatric: She has a normal mood and affect. Her behavior is normal.    ED Course  Procedures (including critical care time) [place]  MICROSCOPIC (NOT AT [facility]) - Abnormal; Notable for the following:    Hgb urine dipstick MODERATE (*)    Protein, ur 100 (*)    Nitrite POSITIVE (*)    Leukocytes, UA SMALL (*)    All other components within normal limits  BASIC METABOLIC PANEL - Abnormal; Notable for the following:    Chloride 100 (*)    All other components within normal limits  URINE MICROSCOPIC-ADD ON - Abnormal; Notable for the following:    Bacteria, UA MANY (*)    All other components within normal limits  URINE CULTURE    Imaging Review No results found. I have personally reviewed and evaluated these images and lab results as part of my medical decision-making.   EKG Interpretation None      MDM   Final diagnoses:  None    Patient presents with increased urinary frequency and dysuria.  VSS, patient appears nontoxic, NAD.  On exam, mild suprapubic tenderness..   Concern for UTI and chronic incontinence with concern for foley leakage.    Imaging not performed.  Labs include UA  and cx, BMP to evaluate kidney function, bladder scan, and foley irrigation.  Will perform catheter care.  Dr. Sabra Heck performed bladder scan at bedside and confirmed catheter placement and decompressable bladder.  UA shows moderate hgb, protein, nitrite positive, and WBCs with many bacteria.  Culture pending.  Adequate renal function-- SCr 0.76.  Suspect complicated UTI. Pt stable for d/c with Cipro.  Advised to follow up in 2 days with PCP for urine culture.  Discussed return precautions and supportive care.  Patient acknowledges and agrees with the above plan.  Case has been discussed with and seen by Dr. Sabra Heck who agrees with the above plan for discharge.      Gloriann Loan, PA-C 12/21/14 1829  Noemi Chapel, MD 12/22/14 [phone]

## 2014-12-21 NOTE — ED Notes (Signed)
New urine leg bag replaced

## 2014-12-21 NOTE — ED Provider Notes (Signed)
The patient is a 81 year old female, she has an indwelling Foley catheter, she has been complaining of urinary leakage around the catheter, she has changed her Foley bag several times in the last 24 hours without improvement, she states that she is constantly leaking around the bag and it is getting on her close. She has no fevers chills nausea or vomiting and has minimal abdominal discomfort in the lower abdomen. On my exam she has minimal suprapubic tenderness, on a bedside ultrasound the Foley catheter is well seated in the urinary bladder and there is no urinary distention. Foley catheter tubing inspected, it is intact, there is urine in the Foley catheter bag, there does not appear to be any active leakage around the Foley catheter. We'll flush the Foley to insure patency, urinalysis, likely disposition home.  Medical screening examination/treatment/procedure(s) were conducted as a shared visit with non-physician practitioner(s) and myself.  I personally evaluated the patient during the encounter.  Clinical Impression:   Final diagnoses:  Complicated UTI (urinary tract infection)         Noemi Chapel, MD 12/22/14 [phone]

## 2014-12-21 NOTE — Discharge Instructions (Signed)

## 2014-12-21 NOTE — ED Notes (Addendum)
Pt c/o frequent urination with vaginal irritation onset yesterday. Pt reports that she does not have bladder control.

## 2014-12-22 ENCOUNTER — Telehealth: Payer: Self-pay | Admitting: *Deleted

## 2014-12-22 NOTE — Telephone Encounter (Signed)
Called Rx clarification to [facility].

## 2014-12-22 NOTE — Telephone Encounter (Signed)
Reviewed Chart and Pharmacy concerns with Muthersbaugh, PA-c who changed the RX to Keflex 500 mg po BID x 7days, #14.  No refills. CM called this to Adonis Huguenin at [place] [facility]. No further CM needs.

## 2014-12-22 NOTE — ED Provider Notes (Signed)
Pt discussed with [facility] (Care management), who received a phone call from pt's pharmacy this morning.  Pt was Rx Cipro for UTI yesterday however she takes coumadin and this is contraindicated.  Rx changed to Keflex 500mg  BID x 7 days with no refills.  Record review shows that urine culture was sent.    Jarrett Soho Muthersbaugh, PA-C 12/22/14 1138  Pattricia Boss, MD 12/31/14 [phone]

## 2014-12-23 LAB — URINE CULTURE

## 2014-12-27 ENCOUNTER — Ambulatory Visit (INDEPENDENT_AMBULATORY_CARE_PROVIDER_SITE_OTHER): Payer: Medicare Other | Admitting: Pharmacist Clinician (PhC)/ Clinical Pharmacy Specialist

## 2014-12-27 DIAGNOSIS — Z7901 Long term (current) use of anticoagulants: Secondary | ICD-10-CM | POA: Diagnosis not present

## 2014-12-27 DIAGNOSIS — I480 Paroxysmal atrial fibrillation: Secondary | ICD-10-CM

## 2014-12-27 LAB — POCT INR: INR: 2.6

## 2015-01-07 ENCOUNTER — Encounter: Payer: Self-pay | Admitting: Cardiovascular Disease

## 2015-01-07 ENCOUNTER — Ambulatory Visit (INDEPENDENT_AMBULATORY_CARE_PROVIDER_SITE_OTHER): Payer: Medicare Other | Admitting: Cardiovascular Disease

## 2015-01-07 ENCOUNTER — Ambulatory Visit (INDEPENDENT_AMBULATORY_CARE_PROVIDER_SITE_OTHER): Payer: Medicare Other | Admitting: *Deleted

## 2015-01-07 VITALS — BP 140/80 | HR 87 | Ht 63.0 in | Wt 145.0 lb

## 2015-01-07 DIAGNOSIS — I110 Hypertensive heart disease with heart failure: Secondary | ICD-10-CM

## 2015-01-07 DIAGNOSIS — I4891 Unspecified atrial fibrillation: Secondary | ICD-10-CM | POA: Diagnosis not present

## 2015-01-07 DIAGNOSIS — I5032 Chronic diastolic (congestive) heart failure: Secondary | ICD-10-CM

## 2015-01-07 DIAGNOSIS — Z7901 Long term (current) use of anticoagulants: Secondary | ICD-10-CM | POA: Diagnosis not present

## 2015-01-07 DIAGNOSIS — R339 Retention of urine, unspecified: Secondary | ICD-10-CM

## 2015-01-07 DIAGNOSIS — I480 Paroxysmal atrial fibrillation: Secondary | ICD-10-CM

## 2015-01-07 DIAGNOSIS — I4892 Unspecified atrial flutter: Secondary | ICD-10-CM

## 2015-01-07 LAB — POCT INR: INR: 2

## 2015-01-07 MED ORDER — METOPROLOL TARTRATE 100 MG PO TABS: 100.0000 mg | ORAL_TABLET | Freq: Two times a day (BID) | ORAL | Status: DC

## 2015-01-07 NOTE — Patient Instructions (Signed)
Your physician has recommended you make the following change in your medication: the lopressor has been increased to 100 mg twice a day. If your heart rate drops and stay below 50 bpm call office for further instructions.  Your physician recommends that you return for lab work tomorrow.  Your physician has requested that you have an echocardiogram. Echocardiography is a painless test that uses sound waves to create images of your heart. It provides your doctor with information about the size and shape of your heart and how well your heart's chambers and valves are working. This procedure takes approximately one hour. There are no restrictions for this procedure.  Your physician recommends that you schedule a follow-up appointment in: 6 weeks with Dr. Claiborne Billings.

## 2015-01-08 ENCOUNTER — Telehealth: Payer: Self-pay | Admitting: Cardiovascular Disease

## 2015-01-08 LAB — COMPREHENSIVE METABOLIC PANEL
ALBUMIN: 4.1 g/dL (ref 3.6–5.1)
ALK PHOS: 83 U/L (ref 33–130)
ALT: 18 U/L (ref 6–29)
AST: 26 U/L (ref 10–35)
BUN: 15 mg/dL (ref 7–25)
CALCIUM: 9.1 mg/dL (ref 8.6–10.4)
CHLORIDE: 104 mmol/L (ref 98–110)
CO2: 25 mmol/L (ref 20–31)
Creat: 0.87 mg/dL (ref 0.60–0.88)
Glucose, Bld: 82 mg/dL (ref 65–99)
POTASSIUM: 4.2 mmol/L (ref 3.5–5.3)
Sodium: 138 mmol/L (ref 135–146)
TOTAL PROTEIN: 6.3 g/dL (ref 6.1–8.1)
Total Bilirubin: 1 mg/dL (ref 0.2–1.2)

## 2015-01-08 LAB — CBC
HEMATOCRIT: 35.2 % — AB (ref 36.0–46.0)
HEMOGLOBIN: 11.3 g/dL — AB (ref 12.0–15.0)
MCH: 26.7 pg (ref 26.0–34.0)
MCHC: 32.1 g/dL (ref 30.0–36.0)
MCV: 83 fL (ref 78.0–100.0)
MPV: 10.7 fL (ref 8.6–12.4)
Platelets: 220 10*3/uL (ref 150–400)
RBC: 4.24 MIL/uL (ref 3.87–5.11)
RDW: 14.3 % (ref 11.5–15.5)
WBC: 3.8 10*3/uL — AB (ref 4.0–10.5)

## 2015-01-08 LAB — MAGNESIUM: MAGNESIUM: 1.6 mg/dL (ref 1.5–2.5)

## 2015-01-08 LAB — TSH: TSH: 0.909 u[IU]/mL (ref 0.350–4.500)

## 2015-01-08 NOTE — Progress Notes (Signed)
Patient ID: Natasha Fletcher, female   DOB: May 28, 1925, 81 y.o.   MRN: [id number]     HPI: Natasha Fletcher is a 81 y.o. female who presents to the office today for a cardiology evaluation.  This is the first time I'm seeing her in the office.  Natasha Fletcher is status post right total knee replacement in July 2015 and developed postoperative anemia, urinary retention and dehydration.  She underwent red cell transfusion.  In August 2015, she developed acute renal failure was omitted to the emergency room with a creatinine of 7.2, potassium 6.2.  She was hydrated, her ACE inhibitor inhibitor was discontinued and a Foley was placed.  She subsequently developed an episode of presyncope and was found to be in atrial fibrillation with RVR leading to her readmission.  Several days later.  Cardiology consultation was obtained in the hospital on 11/11/2013.  An echo revealed ejection fraction of 65-70% with grade 2 diastolic dysfunction and there was evidence for an intracavity gradient of 16 mm.  She was on anticoagulation and ultimately underwent cardioversion in October 2015.  She was in normal sinus rhythm.  On subsequent follow-up in November 2015.  She saw Kerin Ransom in March 2016.  Recently, she is unaware of any heart rhythm irregularity.  She complains of leg swelling.  She denies chest pain.  She is on Coumadin anticoagulation.  She recently developed an urinary tract infection and was placed on anabiotic therapy by Dr. Mar Daring.  She also has an indwelling catheter secondary to incontinence.  The past several weeks she has noticed increasing shortness of breath and more fatigability associated with this UTI. She presents for cardiology evaluation.   Past Medical History  Diagnosis Date  . Hypertension   . Asthma   . Glaucoma   . Incontinence   . Wears dentures   . Wears glasses   . Thyroid disease     "something years ago"  . GERD (gastroesophageal reflux disease)   . Cancer (HCC)     rt breast  .  Edema leg   . Fast breathing     "because of pill"  . Pneumonia     h/o younger  . Arthritis     "all over"  . S/P total knee arthroplasty   . Glaucoma   . Swelling     bilateral feet/ legs, more left.    Past Surgical History  Procedure Laterality Date  . Total hip arthroplasty Bilateral   . Shoulder surgery Left   . Appendectomy    . Carpal tunnel release Bilateral   . Tonsillectomy    . Colonoscopy    . Abdominal hysterectomy    . Eye surgery      both cataracts  . Breast biopsy  04/27/2011    Procedure: BREAST BIOPSY WITH NEEDLE LOCALIZATION;  Surgeon: Edward Jolly, MD;  Location: [employer];  Service: General;  Laterality: Right;  right needle localized breast lumpectomy   . Breast lumpectomy  04/27/11    right breast by [facility]  . Total knee arthroplasty Right 02/19/2013    Procedure: RIGHT TOTAL KNEE ARTHROPLASTY;  Surgeon: Gearlean Alf, MD;  Location: [facility];  Service: Orthopedics;  Laterality: Right;  . Total knee arthroplasty Left 10/15/2013    Procedure: LEFT TOTAL KNEE ARTHROPLASTY;  Surgeon: Gearlean Alf, MD;  Location: [facility];  Service: Orthopedics;  Laterality: Left;  . Cardioversion N/A 02/01/2014    Procedure: CARDIOVERSION;  Surgeon: Candee Furbish, MD;  Location: [facility];  Service: Cardiovascular;  Laterality: N/A;  . Tee without cardioversion N/A 02/01/2014    Procedure: TRANSESOPHAGEAL ECHOCARDIOGRAM (TEE);  Surgeon: Candee Furbish, MD;  Location: [facility];  Service: Cardiovascular;  Laterality: N/A;    Allergies  Allergen Reactions  . Lactose Intolerance (Gi)   . Milk-Related Compounds Other (See Comments)    Upset stomach   . Sulfa Antibiotics Other (See Comments)    Reaction many years ago-unknown    Current Outpatient Prescriptions  Medication Sig Dispense Refill  . anastrozole (ARIMIDEX) 1 MG tablet TAKE 1 TABLET (1 MG TOTAL) BY MOUTH DAILY. 90 tablet 3  . flurazepam (DALMANE) 15 MG capsule Take 15 mg by mouth  at bedtime as needed for sleep.   4  . furosemide (LASIX) 20 MG tablet Take 20 mg by mouth 2 (two) times daily.     Marland Kitchen latanoprost (XALATAN) 0.005 % ophthalmic solution Place 1 drop into both eyes at bedtime.    Marland Kitchen oxybutynin (DITROPAN) 5 MG tablet Take 5 mg by mouth daily.    Marland Kitchen oxyCODONE-acetaminophen (PERCOCET/ROXICET) 5-325 MG per tablet Take by mouth 3 (three) times daily as needed. For pain  0  . tamsulosin (FLOMAX) 0.4 MG CAPS capsule Take 0.4 mg by mouth daily.    . traMADol (ULTRAM) 50 MG tablet Take 50 mg by mouth every 6 (six) hours as needed for moderate pain.    Marland Kitchen warfarin (COUMADIN) 5 MG tablet TAKE 1 TABLET (5 MG TOTAL) BY MOUTH DAILY. 30 tablet 3  . metoprolol (LOPRESSOR) 100 MG tablet Take 1 tablet (100 mg total) by mouth 2 (two) times daily. 60 tablet 6   No current facility-administered medications for this visit.    Social History   Social History  . Marital Status: Single    Spouse Name: N/A  . Number of Children: N/A  . Years of Education: N/A   Occupational History  . Not on file.   Social History Main Topics  . Smoking status: Former Smoker -- 0.00 packs/day for 30 years    Types: Cigarettes    Quit date: 04/21/1975  . Smokeless tobacco: Never Used  . Alcohol Use: Yes     Comment: occasionally  . Drug Use: No  . Sexual Activity: Not Currently   Other Topics Concern  . Not on file   Social History Narrative   From SNF, getting PT and using cane.      Family History  Problem Relation Age of Onset  . Heart disease Mother   . Stroke Father   . Heart attack Neg Hx   . Stroke Mother   . Stroke Brother   . Hypertension Mother   . Hypertension Father     ROS General: Negative; No fevers, chills, or night sweats HEENT: Negative; No changes in vision or hearing, sinus congestion, difficulty swallowing Pulmonary: Negative; No cough, wheezing, shortness of breath, hemoptysis Cardiovascular: See HPI: No chest pain, presyncope, syncope, palpatations GI:  Negative; No nausea, vomiting, diarrhea, or abdominal pain GU: Negative; No dysuria, hematuria, or difficulty voiding Musculoskeletal: Negative; no myalgias, joint pain, or weakness Hematologic: Negative; no easy bruising, bleeding Endocrine: Negative; no heat/cold intolerance; no diabetes, Neuro: Negative; no changes in balance, headaches Skin: Negative; No rashes or skin lesions Psychiatric: Negative; No behavioral problems, depression Sleep: Negative; No snoring,  daytime sleepiness, hypersomnolence, bruxism, restless legs, hypnogognic hallucinations. Other comprehensive 14 point system review is negative   Physical Exam BP 140/80 mmHg  Pulse 87  Ht 5'  3" (1.6 m)  Wt 145 lb (65.772 kg)  BMI 25.69 kg/m2 Wt Readings from Last 3 Encounters:  01/07/15 145 lb (65.772 kg)  12/21/14 143 lb (64.864 kg)  09/01/14 143 lb (64.864 kg)   General: Alert, oriented, no distress.  Skin: normal turgor, no rashes, warm and dry HEENT: Normocephalic, atraumatic. Pupils equal round and reactive to light; sclera anicteric; extraocular muscles intact, No lid lag; Nose without nasal septal hypertrophy; Mouth/Parynx benign; Mallinpatti scale 3 Neck: No JVD, no carotid bruits; normal carotid upstroke Lungs: clear to ausculatation and percussion bilaterally; no wheezing or rales, normal inspiratory and expiratory effort Chest wall: without tenderness to palpitation Heart: Irregularly irregular rhythm with ventricular rate in the mid to upper 80s.  1/6 systolic murmur, No diastolic murmur, no rubs, gallops, thrills, or heaves Abdomen: soft, nontender; no hepatosplenomehaly, BS+; abdominal aorta nontender and not dilated by palpation. Back: no CVA tenderness Pulses: 2+  Musculoskeletal: full range of motion, normal strength, no joint deformities Extremities: Pulses 2+, mild lower extremity edema., Homan's sign negative  Neurologic: grossly nonfocal; Cranial nerves grossly wnl Psychologic: Normal mood and  affect   ECG (independently read by me): Atrial flutter with a ventricular rate at 87 bpm with variable block.  Nonspecific ST-T changes.  My personal review of her March 2016 ECG shows sinus bradycardia at 58  LABS:  BMP Latest Ref Rng 12/21/2014 09/01/2014 08/10/2014  Glucose 65 - 99 mg/dL 85 105(H) 97  BUN 6 - 20 mg/dL 10 22(H) 14  Creatinine 0.44 - 1.00 mg/dL 0.76 1.03(H) 0.86  Sodium 135 - 145 mmol/L 135 136 137  Potassium 3.5 - 5.1 mmol/L 3.7 3.7 3.4(L)  Chloride 101 - 111 mmol/L 100(L) 97(L) 102  CO2 22 - 32 mmol/L _0 Calcium 8.9 - 10.3 mg/dL 9.2 9.6 9.5     Hepatic Function Latest Ref Rng 09/01/2014 08/10/2014 05/28/2014  Total Protein 6.5 - 8.1 g/dL 7.5 7.3 7.0  Albumin 3.5 - 5.0 g/dL 4.0 4.0 3.7  AST 15 - 41 U/L 33 32 28  ALT 14 - 54 U/L _1 Alk Phosphatase 38 - 126 U/L 97 96 113  Total Bilirubin 0.3 - 1.2 mg/dL 0.9 1.4(H) 0.72    CBC Latest Ref Rng 09/01/2014 08/10/2014 06/02/2014  WBC 4.0 - 10.5 K/uL 5.6 3.9(L) -  Hemoglobin 12.0 - 15.0 g/dL 11.5(L) 11.7(L) 11.9(L)  Hematocrit 36.0 - 46.0 % 35.5(L) 35.9(L) 35.0(L)  Platelets 150 - 400 K/uL 212 201 -   Lab Results  Component Value Date   MCV 82.8 09/01/2014   MCV 81.8 08/10/2014   MCV 85.4 05/28/2014    Lab Results  Component Value Date   TSH 1.986 01/24/2014    BNP No results found for: BNP  ProBNP    Component Value Date/Time   PROBNP 3143.0* 11/17/2013 0442     Lipid Panel  No results found for: CHOL, TRIG, HDL, CHOLHDL, VLDL, LDLCALC, LDLDIRECT   RADIOLOGY: No results found.    ASSESSMENT AND PLAN: Natasha Fletcher is an 81 year old African-American female who has a complex medical history as noted above.  I reviewed her hospitalization records as well as her subsequent evaluations by Kerin Ransom cardioversion by Dr. Stanford Breed.  For the last several weeks she has noticed increasing shortness of breath with more fatigue in the setting of developing a urinary tract infection.  Her ECG today  confirms that she no longer is in sinus rhythm and most likely is in atrial flutter with variable  block versus coarse atrial fibrillation.  I am recommending complete set of blood work be obtained consisting of a comprehensive metabolic panel, magnesium level, TSH, and CBC.  I am increasing her metoprolol to 100 mg twice a day.  Her INR was checked in the office and this is 2.0 on her current dose of Coumadin.  She has been taking furosemide 43m twice a day for her lower extremity edema.  I am scheduling her for a follow-up echo Doppler study.  I will see her back in the office in 4-6 weeks for reevaluation and further recommendations will be made at that time concerning potential attempt at repeat cardioversion versus rate control therapy.  Time spent 40 minutes  TTroy Sine MD, [facility] 01/08/2015 6:38 PM

## 2015-01-08 NOTE — Telephone Encounter (Signed)
Natasha Fletcher is calling because the medication that was prescribe for her on yesterday , is costing 103.00 and she cant afford that and want to know is there something else she can get .Thanks

## 2015-01-08 NOTE — Telephone Encounter (Signed)
Pt does not remember name of medication she thinks was renewed or written for yesterday - does not think it was the metoprolol. Thinks she was prescribed an antibiotic but doesn't know name.  Advised only med change yesterday was metoprolol increase.  She will find out name of medication and call or have pharmacy call us.

## 2015-01-09 ENCOUNTER — Telehealth: Payer: Self-pay | Admitting: Cardiovascular Disease

## 2015-01-09 NOTE — Telephone Encounter (Signed)
Spoke with patient, states she cannot afford $70, cannot recall what she paid for 50 mg tabs.  Called [facility].  Metoprolol 100mg  is $3, patient picked up yesterday.  Had prescription for [facility] for UTI that was $70.  Will call patient back to clarify

## 2015-01-09 NOTE — Telephone Encounter (Signed)
Pt called in stating that Dr.Kelly prescribed her Metoprolol and her insurance is charging her $70. She states that she can not afford that and would like to know if there is another medication she could take in the place of this that is cheaper. Please f/u with them  Thanks

## 2015-01-09 NOTE — Telephone Encounter (Signed)
Routed to [place] for help. Discussed this with patient yesterday and, as I told her, I am not aware of less expensive options.

## 2015-01-10 NOTE — Telephone Encounter (Signed)
Clarified info with patient.

## 2015-01-15 ENCOUNTER — Telehealth: Payer: Self-pay | Admitting: *Deleted

## 2015-01-15 MED ORDER — MAGNESIUM OXIDE -MG SUPPLEMENT 200 MG PO TABS: ORAL_TABLET | ORAL | Status: DC

## 2015-01-15 NOTE — Telephone Encounter (Signed)
Patient notified of lab results and recommendations. Mag oxide 200 mg sent in to [facility] per Dr. Evette Georges orders.

## 2015-01-15 NOTE — Telephone Encounter (Signed)
-----   Message from Troy Sine, MD sent at 01/14/2015  3:37 PM EDT ----- Mildly anemic; Mg on very low nl side; add Mg Oxide 200 mg daily for 5 days; TSH nl

## 2015-01-17 ENCOUNTER — Ambulatory Visit (INDEPENDENT_AMBULATORY_CARE_PROVIDER_SITE_OTHER): Payer: Medicare Other | Admitting: Cardiology

## 2015-01-17 ENCOUNTER — Ambulatory Visit (HOSPITAL_COMMUNITY): Payer: Medicare Other | Attending: Internal Medicine

## 2015-01-17 ENCOUNTER — Other Ambulatory Visit: Payer: Self-pay | Admitting: *Deleted

## 2015-01-17 ENCOUNTER — Other Ambulatory Visit: Payer: Self-pay

## 2015-01-17 ENCOUNTER — Encounter: Payer: Self-pay | Admitting: Cardiology

## 2015-01-17 VITALS — BP 140/90 | HR 138 | Ht 64.5 in | Wt 143.0 lb

## 2015-01-17 DIAGNOSIS — I517 Cardiomegaly: Secondary | ICD-10-CM | POA: Insufficient documentation

## 2015-01-17 DIAGNOSIS — I4892 Unspecified atrial flutter: Secondary | ICD-10-CM | POA: Diagnosis not present

## 2015-01-17 DIAGNOSIS — I5032 Chronic diastolic (congestive) heart failure: Secondary | ICD-10-CM

## 2015-01-17 DIAGNOSIS — I4891 Unspecified atrial fibrillation: Secondary | ICD-10-CM

## 2015-01-17 DIAGNOSIS — I313 Pericardial effusion (noninflammatory): Secondary | ICD-10-CM | POA: Insufficient documentation

## 2015-01-17 DIAGNOSIS — Z7901 Long term (current) use of anticoagulants: Secondary | ICD-10-CM

## 2015-01-17 DIAGNOSIS — I10 Essential (primary) hypertension: Secondary | ICD-10-CM | POA: Diagnosis not present

## 2015-01-17 DIAGNOSIS — F17200 Nicotine dependence, unspecified, uncomplicated: Secondary | ICD-10-CM | POA: Insufficient documentation

## 2015-01-17 NOTE — Progress Notes (Signed)
HR during the echo was between 115 and 145 bpm. Dr. Mare Ferrari (DOD) was notified and EKG is being done now.  01/17/2015

## 2015-01-17 NOTE — Patient Instructions (Signed)
Medication Instructions:  Your physician recommends that you continue on your current medications as directed. Please refer to the Current Medication list given to you today. 1.  WHEN YOU GET HOME, MAKE SURE YOU TAKE YOUR LOPRESSOR.  Labwork: None ordered  Testing/Procedures: None ordered  Follow-Up: Your physician recommends that you keep your scheduled appointment with Dr. Claiborne Billings as noted above.

## 2015-01-17 NOTE — Progress Notes (Signed)
Cardiology Office Note   Date:  01/17/2015   ID:  Natasha Fletcher, DOB [birth year], MRN [id number]  PCP:  Myriam Jacobson, MD  Cardiologist: Darlin Coco MD  No chief complaint on file.     History of Present Illness: Natasha Fletcher is a 81 y.o. female who presents for work in office visit for DOD.  This elderly patient who is followed by Dr. Shelva Majestic was here for an echocardiogram today.  I was asked to see her because her heart rate during the procedure was quite rapid in the range of 130 to 150.  She expressed the fact that she was a little more short of breath.  Normally she takes metoprolol tartrate 100 mg twice a day.  However she has not taken any metoprolol today.  She is not having any chest discomfort and she is not acutely dyspneic.  She has a history of diastolic heart failure and is on chronic Lasix.  She has an indwelling Foley catheter.  The results of today's echocardiogram are pending    Past Medical History  Diagnosis Date  . Hypertension   . Asthma   . Glaucoma   . Incontinence   . Wears dentures   . Wears glasses   . Thyroid disease     "something years ago"  . GERD (gastroesophageal reflux disease)   . Cancer (HCC)     rt breast  . Edema leg   . Fast breathing     "because of pill"  . Pneumonia     h/o younger  . Arthritis     "all over"  . S/P total knee arthroplasty   . Glaucoma   . Swelling     bilateral feet/ legs, more left.    Past Surgical History  Procedure Laterality Date  . Total hip arthroplasty Bilateral   . Shoulder surgery Left   . Appendectomy    . Carpal tunnel release Bilateral   . Tonsillectomy    . Colonoscopy    . Abdominal hysterectomy    . Eye surgery      both cataracts  . Breast biopsy  04/27/2011    Procedure: BREAST BIOPSY WITH NEEDLE LOCALIZATION;  Surgeon: Edward Jolly, MD;  Location: [place];  Service: General;  Laterality: Right;  right needle localized breast lumpectomy    . Breast lumpectomy  04/27/11    right breast by [facility]  . Total knee arthroplasty Right 02/19/2013    Procedure: RIGHT TOTAL KNEE ARTHROPLASTY;  Surgeon: Gearlean Alf, MD;  Location: [facility];  Service: Orthopedics;  Laterality: Right;  . Total knee arthroplasty Left 10/15/2013    Procedure: LEFT TOTAL KNEE ARTHROPLASTY;  Surgeon: Gearlean Alf, MD;  Location: [facility];  Service: Orthopedics;  Laterality: Left;  . Cardioversion N/A 02/01/2014    Procedure: CARDIOVERSION;  Surgeon: Candee Furbish, MD;  Location: [facility];  Service: Cardiovascular;  Laterality: N/A;  . Tee without cardioversion N/A 02/01/2014    Procedure: TRANSESOPHAGEAL ECHOCARDIOGRAM (TEE);  Surgeon: Candee Furbish, MD;  Location: [facility] END[facility]COPY;  Service: Cardiovascular;  Laterality: N/A;     Current Outpatient Prescriptions  Medication Sig Dispense Refill  . anastrozole (ARIMIDEX) 1 MG tablet TAKE 1 TABLET (1 MG TOTAL) BY MOUTH DAILY. 90 tablet 3  . flurazepam (DALMANE) 15 MG capsule Take 15 mg by mouth at bedtime as needed for sleep.   4  . furosemide (LASIX) 20 MG tablet Take 20 mg by mouth 2 (two)  times daily.     Marland Kitchen latanoprost (XALATAN) 0.005 % ophthalmic solution Place 1 drop into both eyes at bedtime.    . Magnesium Oxide 200 MG TABS Take 1 tablet by mouth daily. For 5 days    . metoprolol (LOPRESSOR) 100 MG tablet Take 1 tablet (100 mg total) by mouth 2 (two) times daily. 60 tablet 6  . oxybutynin (DITROPAN) 5 MG tablet Take 5 mg by mouth 3 (three) times daily.     Marland Kitchen oxyCODONE-acetaminophen (PERCOCET/ROXICET) 5-325 MG per tablet Take 1 tablet by mouth 3 (three) times daily as needed. For pain  0  . tamsulosin (FLOMAX) 0.4 MG CAPS capsule Take 0.4 mg by mouth daily.    . traMADol (ULTRAM) 50 MG tablet Take 50 mg by mouth every 6 (six) hours as needed for moderate pain.    Marland Kitchen warfarin (COUMADIN) 5 MG tablet TAKE 1 TABLET (5 MG TOTAL) BY MOUTH DAILY. 30 tablet 3   No current facility-administered medications for  this visit.    Allergies:   Lactose intolerance (gi); Milk-related compounds; and Sulfa antibiotics    Social History:  The patient  reports that she quit smoking about 39 years ago. Her smoking use included Cigarettes. She smoked 0.00 packs per day for 30 years. She has never used smokeless tobacco. She reports that she drinks alcohol. She reports that she does not use illicit drugs.   Family History:  The patient's family history includes Heart disease in her mother; Hypertension in her father and mother; Stroke in her brother, father, and mother. There is no history of Heart attack.    ROS:  Please see the history of present illness.   Otherwise, review of systems are positive for none.   All other systems are reviewed and negative.    PHYSICAL EXAM: VS:  BP 140/90 mmHg  Pulse 138  Ht 5' 4.5" (1.638 m)  Wt 143 lb (64.864 kg)  BMI 24.18 kg/m2 , BMI Body mass index is 24.18 kg/(m^2). GEN: Well nourished, well developed, in no acute distress HEENT: normal Neck: no JVD, carotid bruits, or masses Cardiac: Irregularly irregular rhythm.  no murmurs, rubs, or gallops, there is 1+ edema. Respiratory:  clear to auscultation bilaterally, normal work of breathing GI: soft, nontender, nondistended, + BS MS: no deformity or atrophy Skin: warm and dry, no rash Neuro:  Strength and sensation are intact Psych: euthymic mood, full affect   EKG:  EKG is ordered today. The ekg ordered today demonstrates atrial flutter with rapid ventricular response.  Since prior tracing of 01/07/15, heart rate has increased.   Recent Labs: 01/07/2015: ALT 18; BUN 15; Creat 0.87; Hemoglobin 11.3*; Magnesium 1.6; Platelets 220; Potassium 4.2; Sodium 138; TSH 0.909    Lipid Panel No results found for: CHOL, TRIG, HDL, CHOLHDL, VLDL, LDLCALC, LDLDIRECT    Wt Readings from Last 3 Encounters:  01/17/15 143 lb (64.864 kg)  01/07/15 145 lb (65.772 kg)  12/21/14 143 lb (64.864 kg)        ASSESSMENT AND  PLAN:  1.  Atrial flutter fibrillation with rapid ventricular response 2.  Chronic diastolic heart failure.  Updated echocardiogram results pending 3.  Chronic warfarin anticoagulation 4.  Chronic indwelling Foley catheter   Current medicines are reviewed at length with the patient today.  The patient does not have concerns regarding medicines.  The following changes have been made:  no change  Labs/ tests ordered today include:  No orders of the defined types were placed in this  encounter.     Disposition:   The patient is to resume her metoprolol 100 mg twice a day.  I believe that her rapid rate today reflects the fact that she has not had any metoprolol since yesterday.  The importance of taking the metoprolol twice a day every day was stressed to her.  She will follow-up with Dr. Ellouise Newer as scheduled.  Berna Spare MD 01/17/2015 10:09 AM    [address], [place], [place]  [address] Phone: [phone]; Fax: [phone]

## 2015-01-29 ENCOUNTER — Ambulatory Visit: Payer: Medicare Other | Admitting: Pharmacist Clinician (PhC)/ Clinical Pharmacy Specialist

## 2015-01-30 ENCOUNTER — Ambulatory Visit (INDEPENDENT_AMBULATORY_CARE_PROVIDER_SITE_OTHER): Payer: Medicare Other | Admitting: Pharmacist Clinician (PhC)/ Clinical Pharmacy Specialist

## 2015-01-30 DIAGNOSIS — Z7901 Long term (current) use of anticoagulants: Secondary | ICD-10-CM

## 2015-01-30 DIAGNOSIS — I4892 Unspecified atrial flutter: Secondary | ICD-10-CM

## 2015-01-30 DIAGNOSIS — I480 Paroxysmal atrial fibrillation: Secondary | ICD-10-CM | POA: Diagnosis not present

## 2015-01-30 LAB — POCT INR: INR: 1.5

## 2015-02-05 ENCOUNTER — Ambulatory Visit: Payer: Medicare Other | Admitting: Cardiology

## 2015-02-10 ENCOUNTER — Encounter (HOSPITAL_COMMUNITY): Payer: Self-pay | Admitting: *Deleted

## 2015-02-10 ENCOUNTER — Emergency Department (HOSPITAL_COMMUNITY)
Admission: EM | Admit: 2015-02-10 | Discharge: 2015-02-10 | Disposition: A | Discharge status: Home / Self Care | Payer: Medicare Other | Source: Home / Self Care | Attending: Emergency Medicine | Admitting: Emergency Medicine

## 2015-02-10 DIAGNOSIS — T839XXA Unspecified complication of genitourinary prosthetic device, implant and graft, initial encounter: Secondary | ICD-10-CM

## 2015-02-10 DIAGNOSIS — N390 Urinary tract infection, site not specified: Secondary | ICD-10-CM

## 2015-02-10 HISTORY — DX: Unspecified atrial fibrillation: I48.91

## 2015-02-10 LAB — BASIC METABOLIC PANEL
ANION GAP: 10 (ref 5–15)
BUN: 14 mg/dL (ref 6–20)
CHLORIDE: 107 mmol/L (ref 101–111)
CO2: 22 mmol/L (ref 22–32)
Calcium: 9.5 mg/dL (ref 8.9–10.3)
Creatinine, Ser: 0.92 mg/dL (ref 0.44–1.00)
GFR calc non Af Amer: 54 mL/min — ABNORMAL LOW (ref >60)
Glucose, Bld: 92 mg/dL (ref 65–99)
POTASSIUM: 5 mmol/L (ref 3.5–5.1)
Sodium: 139 mmol/L (ref 135–145)

## 2015-02-10 LAB — CBC WITH DIFFERENTIAL/PLATELET
BASOS PCT: 1 %
Basophils Absolute: 0 10*3/uL (ref 0.0–0.1)
EOS ABS: 0.1 10*3/uL (ref 0.0–0.7)
EOS PCT: 2 %
HCT: 39.2 % (ref 36.0–46.0)
HEMOGLOBIN: 12.5 g/dL (ref 12.0–15.0)
LYMPHS ABS: 1.2 10*3/uL (ref 0.7–4.0)
Lymphocytes Relative: 26 %
MCH: 27.4 pg (ref 26.0–34.0)
MCHC: 31.9 g/dL (ref 30.0–36.0)
MCV: 86 fL (ref 78.0–100.0)
Monocytes Absolute: 0.3 10*3/uL (ref 0.1–1.0)
Monocytes Relative: 6 %
NEUTROS PCT: 65 %
Neutro Abs: 2.9 10*3/uL (ref 1.7–7.7)
PLATELETS: 201 10*3/uL (ref 150–400)
RBC: 4.56 MIL/uL (ref 3.87–5.11)
RDW: 14.8 % (ref 11.5–15.5)
WBC: 4.5 10*3/uL (ref 4.0–10.5)

## 2015-02-10 LAB — URINALYSIS, ROUTINE W REFLEX MICROSCOPIC
Bilirubin Urine: NEGATIVE
Glucose, UA: NEGATIVE mg/dL
Ketones, ur: NEGATIVE mg/dL
NITRITE: NEGATIVE
PH: 7 (ref 5.0–8.0)
Protein, ur: 100 mg/dL — AB
SPECIFIC GRAVITY, URINE: 1.021 (ref 1.005–1.030)
Urobilinogen, UA: 4 mg/dL — ABNORMAL HIGH (ref 0.0–1.0)

## 2015-02-10 LAB — URINE MICROSCOPIC-ADD ON

## 2015-02-10 LAB — PROTIME-INR
INR: 1.17 (ref 0.00–1.49)
Prothrombin Time: 15 seconds (ref 11.6–15.2)

## 2015-02-10 MED ORDER — METOPROLOL TARTRATE 25 MG PO TABS
100.0000 mg | ORAL_TABLET | Freq: Once | ORAL | Status: AC
  Administered 2015-02-10: 100 mg via ORAL
  Filled 2015-02-10: qty 4

## 2015-02-10 MED ORDER — DEXTROSE 5 % IV SOLN
1.0000 g | Freq: Once | INTRAVENOUS | Status: AC
  Administered 2015-02-10: 1 g via INTRAVENOUS
  Filled 2015-02-10: qty 10

## 2015-02-10 MED ORDER — CEPHALEXIN 500 MG PO CAPS: 500.0000 mg | ORAL_CAPSULE | Freq: Two times a day (BID) | ORAL | Status: DC

## 2015-02-10 NOTE — ED Provider Notes (Signed)
CSN: [id number]     Arrival date & time 02/10/15  1031 History   First MD Initiated Contact with Patient 02/10/15 1343     Chief Complaint  Patient presents with  . Recurrent UTI     (Consider location/radiation/quality/duration/timing/severity/associated sxs/prior Treatment) HPI Comments: 81yo F w/ PMH including HTN, indwelling foley catheter, T2DM, GERD who p/w urinary leakage and confusion. History obtained with the assistance of the patient's son as the patient is slightly confused during interview. Patient states that she has had leakage around her Foley catheter recently. Her son reports that she has had this problem in the past. For the past 4 days, she has had a mild confusion and occasional hallucinations. She has had these symptoms in the past with urinary tract infection. She is otherwise been eating normally. She has occasional abd pain but denies any currently.  The history is provided by a relative.    Past Medical History  Diagnosis Date  . Hypertension   . Asthma   . Glaucoma   . Incontinence   . Wears dentures   . Wears glasses   . Thyroid disease     "something years ago"  . GERD (gastroesophageal reflux disease)   . Cancer (HCC)     rt breast  . Edema leg   . Fast breathing     "because of pill"  . Pneumonia     h/o younger  . Arthritis     "all over"  . S/P total knee arthroplasty   . Glaucoma   . Swelling     bilateral feet/ legs, more left.  . Atrial fibrillation [facility])    Past Surgical History  Procedure Laterality Date  . Total hip arthroplasty Bilateral   . Shoulder surgery Left   . Appendectomy    . Carpal tunnel release Bilateral   . Tonsillectomy    . Colonoscopy    . Abdominal hysterectomy    . Eye surgery      both cataracts  . Breast biopsy  04/27/2011    Procedure: BREAST BIOPSY WITH NEEDLE LOCALIZATION;  Surgeon: Edward Jolly, MD;  Location: [place];  Service: General;  Laterality: Right;  right needle  localized breast lumpectomy   . Breast lumpectomy  04/27/11    right breast by [place]  . Total knee arthroplasty Right 02/19/2013    Procedure: RIGHT TOTAL KNEE ARTHROPLASTY;  Surgeon: Gearlean Alf, MD;  Location: [facility];  Service: Orthopedics;  Laterality: Right;  . Total knee arthroplasty Left 10/15/2013    Procedure: LEFT TOTAL KNEE ARTHROPLASTY;  Surgeon: Gearlean Alf, MD;  Location: [facility];  Service: Orthopedics;  Laterality: Left;  . Cardioversion N/A 02/01/2014    Procedure: CARDIOVERSION;  Surgeon: Candee Furbish, MD;  Location: [facility] ENDOSCOPY;  Service: Cardiovascular;  Laterality: N/A;  . Tee without cardioversion N/A 02/01/2014    Procedure: TRANSESOPHAGEAL ECHOCARDIOGRAM (TEE);  Surgeon: Candee Furbish, MD;  Location: [facility] ENDOSCOPY;  Service: Cardiovascular;  Laterality: N/A;   Family History  Problem Relation Age of Onset  . Heart disease Mother   . Stroke Father   . Heart attack Neg Hx   . Stroke Mother   . Stroke Brother   . Hypertension Mother   . Hypertension Father    Social History  Substance Use Topics  . Smoking status: Former Smoker -- 0.00 packs/day for 30 years    Types: Cigarettes    Quit date: 04/21/1975  . Smokeless tobacco: Never Used  . Alcohol  Use: Yes     Comment: occasionally   OB History    No data available     Review of Systems  Unable to perform ROS: Mental status change      Allergies  Lactose intolerance (gi); Milk-related compounds; and Sulfa antibiotics  Home Medications   Prior to Admission medications   Medication Sig Start Date End Date Taking? Authorizing Provider  anastrozole (ARIMIDEX) 1 MG tablet TAKE 1 TABLET (1 MG TOTAL) BY MOUTH DAILY. 12/06/14  Yes Nicholas Lose, MD  flurazepam (DALMANE) 15 MG capsule Take 15 mg by mouth at bedtime as needed for sleep.  06/19/14  Yes Historical Provider, MD  furosemide (LASIX) 20 MG tablet Take 20 mg by mouth 2 (two) times daily.    Yes Historical Provider, MD  latanoprost (XALATAN) 0.005  % ophthalmic solution Place 1 drop into both eyes at bedtime.   Yes Historical Provider, MD  Magnesium Oxide 200 MG TABS Take 1 tablet by mouth daily. For 5 days   Yes Historical Provider, MD  metoprolol (LOPRESSOR) 100 MG tablet Take 1 tablet (100 mg total) by mouth 2 (two) times daily. 01/07/15  Yes Troy Sine, MD  oxybutynin (DITROPAN) 5 MG tablet Take 5 mg by mouth 3 (three) times daily.    Yes Historical Provider, MD  tamsulosin (FLOMAX) 0.4 MG CAPS capsule Take 0.4 mg by mouth daily.   Yes Historical Provider, MD  traMADol (ULTRAM) 50 MG tablet Take 50 mg by mouth every 6 (six) hours as needed for moderate pain.   Yes Historical Provider, MD  warfarin (COUMADIN) 5 MG tablet TAKE 1 TABLET (5 MG TOTAL) BY MOUTH DAILY. 12/05/14  Yes Troy Sine, MD  cephALEXin (KEFLEX) 500 MG capsule Take 1 capsule (500 mg total) by mouth 2 (two) times daily. 02/10/15   Wenda Overland Roylene Heaton, MD   BP 133/91 mmHg  Pulse 105  Temp(Src) 97.8 F (36.6 C) (Oral)  Resp 20  SpO2 98% Physical Exam  Constitutional: She appears well-developed and well-nourished. No distress.  HENT:  Head: Normocephalic and atraumatic.  Moist mucous membranes  Eyes: Conjunctivae are normal. Pupils are equal, round, and reactive to light.  Neck: Neck supple.  Cardiovascular: Normal heart sounds.   No murmur heard. Irregularly irregular rhythm  Pulmonary/Chest: Effort normal and breath sounds normal.  Abdominal: Soft. Bowel sounds are normal. She exhibits no distension. There is no tenderness.  Genitourinary:  Malodorous urine  Musculoskeletal: She exhibits no edema.  Neurological: She is alert.  Fluent speech, oriented to person  Skin: Skin is warm and dry.  Psychiatric: She has a normal mood and affect.  Nursing note and vitals reviewed.   ED Course  Procedures (including critical care time) Labs Review Labs Reviewed  URINALYSIS, ROUTINE W REFLEX MICROSCOPIC (NOT AT [facility]) - Abnormal; Notable for the following:     APPearance HAZY (*)    Hgb urine dipstick LARGE (*)    Protein, ur 100 (*)    Urobilinogen, UA 4.0 (*)    Leukocytes, UA MODERATE (*)    All other components within normal limits  BASIC METABOLIC PANEL - Abnormal; Notable for the following:    GFR calc non Af Amer 54 (*)    All other components within normal limits  URINE MICROSCOPIC-ADD ON - Abnormal; Notable for the following:    Squamous Epithelial / LPF FEW (*)    Bacteria, UA FEW (*)    All other components within normal limits  URINE CULTURE  CBC  WITH DIFFERENTIAL/PLATELET  PROTIME-INR    Imaging Review No results found. I have personally reviewed and evaluated these lab results as part of my medical decision-making.   EKG Interpretation None     Medications  metoprolol tartrate (LOPRESSOR) tablet 100 mg (not administered)  cefTRIAXone (ROCEPHIN) 1 g in dextrose 5 % 50 mL IVPB (1 g Intravenous New Bag/Given 02/10/15 1623)    MDM   Final diagnoses:  UTI (lower urinary tract infection)  Foley catheter problem, initial encounter [facility])   81 year old female with indwelling Foley catheter and previous UTIs who presents with several days of leakage around her catheter as well as waxing and waning confusion according to family members. Patient comfortable at presentation with stable vital signs. She was confused but able to answer basic questions during interview. No abdominal tenderness on exam. Foul-smelling urine noted. Obtained basic lab work including UA and urine culture and bedside nurse replaced Foley catheter. Labs show evidence of UTI but otherwise reassuring labs. Gave the patient a dose of ceftriaxone. On reexamination, she is comfortable and eating a sandwich. Her heart rate has been variable in the ED and I suspect she missed her morning dose of Lopressor. Gave the patient her evening dose of 100mg . and patient will be discharged after her heart rate improves. I have reviewed supportive care instructions and antibiotic  course with the patient's son who voiced understanding. Return precautions also reviewed.  Sharlett Iles, MD 02/10/15 [phone]

## 2015-02-10 NOTE — ED Notes (Signed)
Patient's brother expressing concerns about patient being discharged, Dr. Regenia Skeeter made aware.

## 2015-02-10 NOTE — ED Notes (Signed)
MD at bedside. 

## 2015-02-10 NOTE — ED Notes (Signed)
Pt has indwelling urinary catheter, reports recent leakage and it falls out. Pt reports its been out since last night, having incontinence and possible UTI.

## 2015-02-10 NOTE — ED Provider Notes (Signed)
Patient is awake and alert, mildly confused. HR variable, in 80s, 90s, then low 100s. No signs of sepsis, has chronic afib. No obvious criteria for admission with no signs of sepsis and help at home. Plan for D/c, recommend f/u with PCP in 1-2 days.  Sherwood Gambler, MD 02/10/15 [phone]

## 2015-02-10 NOTE — ED Notes (Signed)
Dr. Rex Kras advising to remove current foley catheter and replace with a new foley catheter.

## 2015-02-10 NOTE — ED Notes (Signed)
Family at bedside. 

## 2015-02-10 NOTE — Discharge Instructions (Signed)
Catheter-Associated Urinary Tract Infection FAQs  What is "catheter-associated urinary tract infection"?  A urinary tract infection (also called "UTI") is an infection in the urinary system, which includes the bladder (which stores the urine) and the kidneys (which filter the blood to make urine). Germs (for example, bacteria or yeasts) do not normally live in these areas; but if germs are introduced, an infection can occur.  If you have a urinary catheter, germs can travel along the catheter and cause an infection in your bladder or your kidney; in that case it is called a catheter-associated urinary tract infection (or "CA-UTI").   What is a urinary catheter?  A urinary catheter is a thin tube placed in the bladder to drain urine. Urine drains through the tube into a bag that collects the urine. A urinary catheter may be used:  · If you are not able to urinate on your own  · To measure the amount of urine that you make, for example, during intensive care  · During and after some types of surgery  · During some tests of the kidneys and bladder  People with urinary catheters have a much higher chance of getting a urinary tract infection than people who don't have a catheter.  How do I get a catheter-associated urinary tract infection (CA-UTI)?  If germs enter the urinary tract, they may cause an infection. Many of the germs that cause a catheter-associated urinary tract infection are common germs found in your intestines that do not usually cause an infection there. Germs can enter the urinary tract when the catheter is being put in or while the catheter remains in the bladder.   What are the symptoms of a urinary tract infection?  Some of the common symptoms of a urinary tract infection are:  · Burning or pain in the lower abdomen (that is, below the stomach)  · Fever  · Bloody urine may be a sign of infection, but is also caused by other problems  · Burning during urination or an increase in the frequency of  urination after the catheter is removed.  Sometimes people with catheter-associated urinary tract infections do not have these symptoms of infection.  Can catheter-associated urinary tract infections be treated?  Yes, most catheter-associated urinary tract infections can be treated with antibiotics and removal or change of the catheter. Your doctor will determine which antibiotic is best for you.   What are some of the things that hospitals are doing to prevent catheter-associated urinary tract infections?  To prevent urinary tract infections, doctors and nurses take the following actions.   Catheter insertion  · External catheters in men (these look like condoms and are placed over the penis rather than into the penis)  · Putting a temporary catheter in to drain the urine and removing it right away. This is called intermittent urethral catheterization.  Catheter care  What can I do to help prevent catheter-associated urinary tract infections if I have a catheter?  · Always clean your hands before and after doing catheter care.  · Always keep your urine bag below the level of your bladder.  · Do not tug or pull on the tubing.  · Do not twist or kink the catheter tubing.  · Ask your healthcare provider each day if you still need the catheter.  What do I need to do when I go home from the hospital?  · If you will be going home with a catheter, your doctor or nurse should explain everything   you need to know about taking care of the catheter. Make sure you understand how to care for it before you leave the hospital.  · If you develop any of the symptoms of a urinary tract infection, such as burning or pain in the lower abdomen, fever, or an increase in the frequency of urination, contact your doctor or nurse immediately.  · Before you go home, make sure you know who to contact if you have questions or problems after you get home.  If you have questions, please ask your doctor or nurse.  Developed and co-sponsored by The  Society for Healthcare Epidemiology of America (SHEA); Infectious Diseases Society of America (IDSA); American Hospital Association; Association for Professionals in Infection Control and Epidemiology (APIC); Centers for Disease Control and Prevention (CDC); and The Joint Commission.     This information is not intended to replace advice given to you by your health care provider. Make sure you discuss any questions you have with your health care provider.     Document Released: 12/15/2011 Document Revised: 08/06/2014 Document Reviewed: 06/05/2014  Elsevier Interactive Patient Education ©2016 [employer]vier Inc[employer]

## 2015-02-10 NOTE — ED Notes (Signed)
Pt has urinary catheter still in bladder, no drainage bag attached at this time. Pt states the catheter leaks and "comes out" and she does not want it anymore. Pt reports indwelling catheter since 10/19 for excessive urine output"

## 2015-02-11 LAB — URINE CULTURE: SPECIAL REQUESTS: NORMAL

## 2015-02-12 ENCOUNTER — Encounter (HOSPITAL_COMMUNITY): Payer: Self-pay | Admitting: Emergency Medicine

## 2015-02-12 ENCOUNTER — Inpatient Hospital Stay (HOSPITAL_COMMUNITY)
Admission: EM | Admit: 2015-02-12 | Discharge: 2015-02-17 | DRG: 698 | Disposition: A | Discharge status: Skilled Nursing Facility | Payer: Medicare Other | Attending: Internal Medicine | Admitting: Internal Medicine

## 2015-02-12 ENCOUNTER — Ambulatory Visit: Payer: Medicare Other | Admitting: Pharmacist Clinician (PhC)/ Clinical Pharmacy Specialist

## 2015-02-12 ENCOUNTER — Emergency Department (HOSPITAL_COMMUNITY): Payer: Medicare Other

## 2015-02-12 ENCOUNTER — Inpatient Hospital Stay (HOSPITAL_COMMUNITY): Payer: Medicare Other

## 2015-02-12 DIAGNOSIS — Z7901 Long term (current) use of anticoagulants: Secondary | ICD-10-CM

## 2015-02-12 DIAGNOSIS — I482 Chronic atrial fibrillation: Secondary | ICD-10-CM | POA: Diagnosis present

## 2015-02-12 DIAGNOSIS — N390 Urinary tract infection, site not specified: Secondary | ICD-10-CM | POA: Diagnosis present

## 2015-02-12 DIAGNOSIS — R410 Disorientation, unspecified: Secondary | ICD-10-CM | POA: Diagnosis present

## 2015-02-12 DIAGNOSIS — Z96653 Presence of artificial knee joint, bilateral: Secondary | ICD-10-CM | POA: Diagnosis present

## 2015-02-12 DIAGNOSIS — F0390 Unspecified dementia without behavioral disturbance: Secondary | ICD-10-CM | POA: Diagnosis present

## 2015-02-12 DIAGNOSIS — Y846 Urinary catheterization as the cause of abnormal reaction of the patient, or of later complication, without mention of misadventure at the time of the procedure: Secondary | ICD-10-CM | POA: Diagnosis present

## 2015-02-12 DIAGNOSIS — I480 Paroxysmal atrial fibrillation: Secondary | ICD-10-CM | POA: Diagnosis not present

## 2015-02-12 DIAGNOSIS — I110 Hypertensive heart disease with heart failure: Secondary | ICD-10-CM | POA: Diagnosis present

## 2015-02-12 DIAGNOSIS — Z66 Do not resuscitate: Secondary | ICD-10-CM | POA: Diagnosis present

## 2015-02-12 DIAGNOSIS — Z79899 Other long term (current) drug therapy: Secondary | ICD-10-CM

## 2015-02-12 DIAGNOSIS — T83511A Infection and inflammatory reaction due to indwelling urethral catheter, initial encounter: Secondary | ICD-10-CM

## 2015-02-12 DIAGNOSIS — E860 Dehydration: Secondary | ICD-10-CM | POA: Diagnosis present

## 2015-02-12 DIAGNOSIS — E876 Hypokalemia: Secondary | ICD-10-CM

## 2015-02-12 DIAGNOSIS — Z96643 Presence of artificial hip joint, bilateral: Secondary | ICD-10-CM | POA: Diagnosis present

## 2015-02-12 DIAGNOSIS — D696 Thrombocytopenia, unspecified: Secondary | ICD-10-CM

## 2015-02-12 DIAGNOSIS — A419 Sepsis, unspecified organism: Secondary | ICD-10-CM | POA: Diagnosis present

## 2015-02-12 DIAGNOSIS — Z87891 Personal history of nicotine dependence: Secondary | ICD-10-CM | POA: Diagnosis not present

## 2015-02-12 DIAGNOSIS — H409 Unspecified glaucoma: Secondary | ICD-10-CM | POA: Diagnosis present

## 2015-02-12 DIAGNOSIS — I5032 Chronic diastolic (congestive) heart failure: Secondary | ICD-10-CM | POA: Diagnosis present

## 2015-02-12 DIAGNOSIS — T83518A Infection and inflammatory reaction due to other urinary catheter, initial encounter: Secondary | ICD-10-CM | POA: Diagnosis present

## 2015-02-12 DIAGNOSIS — C50911 Malignant neoplasm of unspecified site of right female breast: Secondary | ICD-10-CM | POA: Diagnosis present

## 2015-02-12 DIAGNOSIS — I10 Essential (primary) hypertension: Secondary | ICD-10-CM | POA: Diagnosis not present

## 2015-02-12 DIAGNOSIS — J45909 Unspecified asthma, uncomplicated: Secondary | ICD-10-CM | POA: Diagnosis present

## 2015-02-12 DIAGNOSIS — G9340 Encephalopathy, unspecified: Secondary | ICD-10-CM | POA: Diagnosis present

## 2015-02-12 DIAGNOSIS — R32 Unspecified urinary incontinence: Secondary | ICD-10-CM | POA: Diagnosis present

## 2015-02-12 HISTORY — DX: Urinary tract infection, site not specified: N39.0

## 2015-02-12 HISTORY — DX: Type 2 diabetes mellitus without complications: E11.9

## 2015-02-12 LAB — COMPREHENSIVE METABOLIC PANEL
ALBUMIN: 3.8 g/dL (ref 3.5–5.0)
ALK PHOS: 136 U/L — AB (ref 38–126)
ALT: 52 U/L (ref 14–54)
ANION GAP: 15 (ref 5–15)
AST: 63 U/L — ABNORMAL HIGH (ref 15–41)
BUN: 22 mg/dL — ABNORMAL HIGH (ref 6–20)
CALCIUM: 10 mg/dL (ref 8.9–10.3)
CHLORIDE: 107 mmol/L (ref 101–111)
CO2: 22 mmol/L (ref 22–32)
Creatinine, Ser: 1.14 mg/dL — ABNORMAL HIGH (ref 0.44–1.00)
GFR calc non Af Amer: 42 mL/min — ABNORMAL LOW (ref >60)
GFR, EST AFRICAN AMERICAN: 49 mL/min — AB (ref >60)
GLUCOSE: 141 mg/dL — AB (ref 65–99)
POTASSIUM: 4 mmol/L (ref 3.5–5.1)
Sodium: 144 mmol/L (ref 135–145)
Total Bilirubin: 2 mg/dL — ABNORMAL HIGH (ref 0.3–1.2)
Total Protein: 7.4 g/dL (ref 6.5–8.1)

## 2015-02-12 LAB — PROTIME-INR
INR: 1.38 (ref 0.00–1.49)
PROTHROMBIN TIME: 17.1 s — AB (ref 11.6–15.2)

## 2015-02-12 LAB — CBC
HEMATOCRIT: 42.8 % (ref 36.0–46.0)
HEMOGLOBIN: 14 g/dL (ref 12.0–15.0)
MCH: 27.3 pg (ref 26.0–34.0)
MCHC: 32.7 g/dL (ref 30.0–36.0)
MCV: 83.4 fL (ref 78.0–100.0)
Platelets: 211 10*3/uL (ref 150–400)
RBC: 5.13 MIL/uL — AB (ref 3.87–5.11)
RDW: 14.4 % (ref 11.5–15.5)
WBC: 6.5 10*3/uL (ref 4.0–10.5)

## 2015-02-12 LAB — URINE MICROSCOPIC-ADD ON

## 2015-02-12 LAB — I-STAT CG4 LACTIC ACID, ED
LACTIC ACID, VENOUS: 2.87 mmol/L — AB (ref 0.5–2.0)
Lactic Acid, Venous: 3.76 mmol/L (ref 0.5–2.0)

## 2015-02-12 LAB — URINALYSIS, ROUTINE W REFLEX MICROSCOPIC
Bilirubin Urine: NEGATIVE
GLUCOSE, UA: NEGATIVE mg/dL
Ketones, ur: 15 mg/dL — AB
LEUKOCYTES UA: NEGATIVE
Nitrite: NEGATIVE
PH: 5 (ref 5.0–8.0)
SPECIFIC GRAVITY, URINE: 1.026 (ref 1.005–1.030)
Urobilinogen, UA: 0.2 mg/dL (ref 0.0–1.0)

## 2015-02-12 LAB — LACTIC ACID, PLASMA
LACTIC ACID, VENOUS: 3.1 mmol/L — AB (ref 0.5–2.0)
Lactic Acid, Venous: 2.1 mmol/L (ref 0.5–2.0)
Lactic Acid, Venous: 1.7 mmol/L (ref 0.5–2.0)

## 2015-02-12 LAB — MRSA PCR SCREENING: MRSA BY PCR: NEGATIVE

## 2015-02-12 LAB — CBG MONITORING, ED: GLUCOSE-CAPILLARY: 126 mg/dL — AB (ref 65–99)

## 2015-02-12 MED ORDER — SODIUM CHLORIDE 0.9 % IV BOLUS (SEPSIS)
500.0000 mL | Freq: Once | INTRAVENOUS | Status: AC
  Administered 2015-02-12: 500 mL via INTRAVENOUS

## 2015-02-12 MED ORDER — WARFARIN - PHARMACIST DOSING INPATIENT
Freq: Every day | Status: DC
  Administered 2015-02-14: 18:00:00

## 2015-02-12 MED ORDER — DILTIAZEM HCL 100 MG IV SOLR
5.0000 mg/h | INTRAVENOUS | Status: DC
  Administered 2015-02-12: 10 mg/h via INTRAVENOUS
  Administered 2015-02-12: 5 mg/h via INTRAVENOUS
  Filled 2015-02-12 (×3): qty 100

## 2015-02-12 MED ORDER — WARFARIN SODIUM 7.5 MG PO TABS: 7.5000 mg | ORAL_TABLET | Freq: Once | ORAL | Status: DC

## 2015-02-12 MED ORDER — WARFARIN SODIUM 5 MG PO TABS
5.0000 mg | ORAL_TABLET | Freq: Once | ORAL | Status: AC
  Administered 2015-02-12: 5 mg via ORAL
  Filled 2015-02-12: qty 1

## 2015-02-12 MED ORDER — DEXTROSE 5 % IV SOLN: 2.0000 g | Freq: Once | INTRAVENOUS | Status: DC

## 2015-02-12 MED ORDER — LATANOPROST 0.005 % OP SOLN
1.0000 [drp] | Freq: Every day | OPHTHALMIC | Status: DC
  Administered 2015-02-12 – 2015-02-15 (×4): 1 [drp] via OPHTHALMIC
  Filled 2015-02-12 (×2): qty 2.5

## 2015-02-12 MED ORDER — ACETAMINOPHEN 325 MG PO TABS: 650.0000 mg | ORAL_TABLET | Freq: Four times a day (QID) | ORAL | Status: DC | PRN

## 2015-02-12 MED ORDER — FLURAZEPAM HCL 15 MG PO CAPS: 15.0000 mg | ORAL_CAPSULE | Freq: Every evening | ORAL | Status: DC | PRN

## 2015-02-12 MED ORDER — SODIUM CHLORIDE 0.9 % IV BOLUS (SEPSIS)
250.0000 mL | Freq: Once | INTRAVENOUS | Status: AC
  Administered 2015-02-12: 250 mL via INTRAVENOUS

## 2015-02-12 MED ORDER — TAMSULOSIN HCL 0.4 MG PO CAPS
0.4000 mg | ORAL_CAPSULE | Freq: Every day | ORAL | Status: DC
  Administered 2015-02-12 – 2015-02-17 (×6): 0.4 mg via ORAL
  Filled 2015-02-12 (×6): qty 1

## 2015-02-12 MED ORDER — SODIUM CHLORIDE 0.9 % IV BOLUS (SEPSIS)
1000.0000 mL | INTRAVENOUS | Status: AC
  Administered 2015-02-12 (×2): 1000 mL via INTRAVENOUS

## 2015-02-12 MED ORDER — ONDANSETRON HCL 4 MG/2ML IJ SOLN: 4.0000 mg | Freq: Four times a day (QID) | INTRAMUSCULAR | Status: DC | PRN

## 2015-02-12 MED ORDER — DEXTROSE 5 % IV SOLN
1.0000 g | INTRAVENOUS | Status: DC
  Administered 2015-02-12: 1 g via INTRAVENOUS
  Filled 2015-02-12: qty 10

## 2015-02-12 MED ORDER — DEXTROSE 5 % IV SOLN
2.0000 g | INTRAVENOUS | Status: DC
  Administered 2015-02-13: 2 g via INTRAVENOUS
  Filled 2015-02-12: qty 2

## 2015-02-12 MED ORDER — TRAMADOL HCL 50 MG PO TABS
50.0000 mg | ORAL_TABLET | Freq: Four times a day (QID) | ORAL | Status: DC | PRN
  Administered 2015-02-12 – 2015-02-17 (×10): 50 mg via ORAL
  Filled 2015-02-12 (×10): qty 1

## 2015-02-12 MED ORDER — BISACODYL 10 MG RE SUPP
10.0000 mg | Freq: Every day | RECTAL | Status: DC | PRN
  Administered 2015-02-14: 10 mg via RECTAL
  Filled 2015-02-12: qty 1

## 2015-02-12 MED ORDER — CEFTRIAXONE SODIUM 1 G IJ SOLR
1.0000 g | Freq: Once | INTRAMUSCULAR | Status: AC
  Administered 2015-02-12: 1 g via INTRAVENOUS
  Filled 2015-02-12: qty 10

## 2015-02-12 MED ORDER — HYDRALAZINE HCL 20 MG/ML IJ SOLN: 10.0000 mg | Freq: Four times a day (QID) | INTRAMUSCULAR | Status: DC | PRN

## 2015-02-12 MED ORDER — OXYBUTYNIN CHLORIDE 5 MG PO TABS
5.0000 mg | ORAL_TABLET | Freq: Three times a day (TID) | ORAL | Status: DC
  Administered 2015-02-12 – 2015-02-17 (×16): 5 mg via ORAL
  Filled 2015-02-12 (×17): qty 1

## 2015-02-12 MED ORDER — ANASTROZOLE 1 MG PO TABS
1.0000 mg | ORAL_TABLET | Freq: Every day | ORAL | Status: DC
  Administered 2015-02-13 – 2015-02-17 (×5): 1 mg via ORAL
  Filled 2015-02-12 (×5): qty 1

## 2015-02-12 MED ORDER — METOPROLOL TARTRATE 100 MG PO TABS
100.0000 mg | ORAL_TABLET | Freq: Two times a day (BID) | ORAL | Status: DC
  Administered 2015-02-13 – 2015-02-16 (×7): 100 mg via ORAL
  Filled 2015-02-12: qty 1
  Filled 2015-02-12 (×3): qty 2
  Filled 2015-02-12 (×2): qty 1
  Filled 2015-02-12: qty 2
  Filled 2015-02-12: qty 1

## 2015-02-12 MED ORDER — ONDANSETRON HCL 4 MG PO TABS: 4.0000 mg | ORAL_TABLET | Freq: Four times a day (QID) | ORAL | Status: DC | PRN

## 2015-02-12 MED ORDER — SODIUM CHLORIDE 0.9 % IJ SOLN
3.0000 mL | Freq: Two times a day (BID) | INTRAMUSCULAR | Status: DC
  Administered 2015-02-12 – 2015-02-14 (×4): 3 mL via INTRAVENOUS

## 2015-02-12 MED ORDER — BOOST / RESOURCE BREEZE PO LIQD
1.0000 | Freq: Three times a day (TID) | ORAL | Status: DC
  Administered 2015-02-13 – 2015-02-14 (×5): 1 via ORAL
  Administered 2015-02-14: 11:00:00 via ORAL
  Administered 2015-02-15 – 2015-02-17 (×7): 1 via ORAL

## 2015-02-12 MED ORDER — ACETAMINOPHEN 650 MG RE SUPP: 650.0000 mg | Freq: Four times a day (QID) | RECTAL | Status: DC | PRN

## 2015-02-12 MED ORDER — ZOLPIDEM TARTRATE 5 MG PO TABS: 5.0000 mg | ORAL_TABLET | Freq: Every evening | ORAL | Status: DC | PRN

## 2015-02-12 MED ORDER — SODIUM CHLORIDE 0.9 % IV SOLN
INTRAVENOUS | Status: DC
  Administered 2015-02-12: 18:00:00 via INTRAVENOUS
  Administered 2015-02-13: 75 mL/h via INTRAVENOUS
  Administered 2015-02-13 – 2015-02-14 (×2): via INTRAVENOUS

## 2015-02-12 NOTE — ED Notes (Signed)
Patient transported to X-ray 

## 2015-02-12 NOTE — ED Notes (Signed)
CBG 126  

## 2015-02-12 NOTE — Progress Notes (Signed)
Utilization Review Completed.Brenna Friesenhahn T1/19/2016  

## 2015-02-12 NOTE — ED Notes (Signed)
MD at bedside. 

## 2015-02-12 NOTE — Progress Notes (Addendum)
ANTIBIOTIC/ANTICOAG CONSULT NOTE - INITIAL  Pharmacy Consult for CTX / Warfarin Indication: UTI, sepsis / Afib  Allergies  Allergen Reactions  . Lactose Intolerance (Gi)   . Milk-Related Compounds Other (See Comments)    Upset stomach   . Sulfa Antibiotics Other (See Comments)    Reaction many years ago-unknown    Patient Measurements: Weight: 140 lb (63.504 kg)  Vital Signs: Temp: 98.2 F (36.8 C) (11/09 1413) Temp Source: Axillary (11/09 1413) BP: 146/74 mmHg (11/09 1500) Pulse Rate: 105 (11/09 1500) Intake/Output from previous day:   Intake/Output from this shift: Total I/O In: -  Out: 650 [Urine:650]  Labs:  Recent Labs  02/10/15 1500 02/12/15 0942  WBC 4.5 6.5  HGB 12.5 14.0  PLT 201 211  CREATININE 0.92 1.14*   Estimated Creatinine Clearance: 30.7 mL/min (by C-G formula based on Cr of 1.14). No results for input(s): VANCOTROUGH, VANCOPEAK, VANCORANDOM, GENTTROUGH, GENTPEAK, GENTRANDOM, TOBRATROUGH, TOBRAPEAK, TOBRARND, AMIKACINPEAK, AMIKACINTROU, AMIKACIN in the last 72 hours.   Microbiology: Recent Results (from the past 720 hour(s))  Urine culture     Status: None   Collection Time: 02/10/15  2:30 PM  Result Value Ref Range Status   Specimen Description URINE, CATHETERIZED  Final   Special Requests Normal  Final   Culture MULTIPLE SPECIES PRESENT, SUGGEST RECOLLECTION  Final   Report Status 02/11/2015 FINAL  Final    Medical History: Past Medical History  Diagnosis Date  . Hypertension   . Asthma   . Glaucoma   . Incontinence   . Wears dentures   . Wears glasses   . Thyroid disease     "something years ago"  . GERD (gastroesophageal reflux disease)   . Cancer (HCC)     rt breast  . Edema leg   . Fast breathing     "because of pill"  . Pneumonia     h/o younger  . Arthritis     "all over"  . S/P total knee arthroplasty   . Glaucoma   . Swelling     bilateral feet/ legs, more left.  . Atrial fibrillation ([place])   . Diabetes  mellitus without complication ([place])    Assessment: 35 yof with hx afib on warfarin pta in ED 2 days ago with AMS, suspected UTI - given Keflex. Now back with sepsis, AMS, elevated LA. Pharmacy consulted to dose CTX for UTI and warfarin for afib.  CHADSVASC 5. INR subtherapeutic 1.38 on admit, up from 1.17 on 11.7 (Last dose pta not recorded but patient thinks she takes med at night).  PTA dose: 5mg  daily except 7.5mg  on Fri per last anticoag note from 10/27  CTX D#1 for UTI/sepsis. Afeb, wbc wnl, LA 2.87  11/9 CTX>>  11/9 BCx2>> 11/9 UC>>  Goal of Therapy:  INR 2-3 Eradication of infection  Plan:  Warfarin 5mg  x 1 dose tonight Daily INR Mon s/sx bleeding  Bump CTX up to 2g IV q24h for sepsis Monitor clinical progress, c/s, abx plan/LOT  Elicia Lamp, PharmD Clinical Pharmacist Pager [phone] 02/12/2015 3:33 PM

## 2015-02-12 NOTE — ED Notes (Signed)
Patient's o2 sat dropped to 78, patient placed on 5L 02 via n.c, 02 back up to 100%. Pt lethargic, unable to answer questions. PA Geiple aware and at bedside to assess patient.

## 2015-02-12 NOTE — ED Provider Notes (Signed)
CSN: [id number]     Arrival date & time 02/12/15  8850 History   First MD Initiated Contact with Patient 02/12/15 [phone]     Chief Complaint  Patient presents with  . Altered Mental Status     (Consider location/radiation/quality/duration/timing/severity/associated sxs/prior Treatment) HPI Comments: Patient with history of indwelling Foley catheter due to incontinence, congestive heart failure, chronic atrial fibrillation on Coumadin -- presents with worsening confusion and weakness. Patient was seen 2 days ago in emergency department for mild confusion and leakage around her urinary catheter. This was replaced. Patient was found to have a urinary tract infection and she was started on Keflex. Patient has had several doses of this without improvement. She saw urologist yesterday and the Foley was replaced again after there was a tear found in the bag. Patient continues to be altered. She is seeing people that are not there. She is uncertain as to her location. Son states that she has slid out of bed twice in the past 2 days-she doesn't think that she hit her head. No nausea, vomiting. No abdominal pain. Patient had an episode of fecal incontinence yesterday at the urologist office. Onset of symptoms acute. Course is persistent. Nothing makes symptoms better or worse.  Patient is a 81 y.o. female presenting with altered mental status. The history is provided by the patient and medical records.  Altered Mental Status Presenting symptoms: confusion   Associated symptoms: weakness   Associated symptoms: no abdominal pain, no fever, no headaches, no nausea, no rash and no vomiting     Past Medical History  Diagnosis Date  . Hypertension   . Asthma   . Glaucoma   . Incontinence   . Wears dentures   . Wears glasses   . Thyroid disease     "something years ago"  . GERD (gastroesophageal reflux disease)   . Cancer (HCC)     rt breast  . Edema leg   . Fast breathing     "because of pill"  .  Pneumonia     h/o younger  . Arthritis     "all over"  . S/P total knee arthroplasty   . Glaucoma   . Swelling     bilateral feet/ legs, more left.  . Atrial fibrillation [facility])    Past Surgical History  Procedure Laterality Date  . Total hip arthroplasty Bilateral   . Shoulder surgery Left   . Appendectomy    . Carpal tunnel release Bilateral   . Tonsillectomy    . Colonoscopy    . Abdominal hysterectomy    . Eye surgery      both cataracts  . Breast biopsy  04/27/2011    Procedure: BREAST BIOPSY WITH NEEDLE LOCALIZATION;  Surgeon: Edward Jolly, MD;  Location: [place];  Service: General;  Laterality: Right;  right needle localized breast lumpectomy   . Breast lumpectomy  04/27/11    right breast by [facility]  . Total knee arthroplasty Right 02/19/2013    Procedure: RIGHT TOTAL KNEE ARTHROPLASTY;  Surgeon: Gearlean Alf, MD;  Location: [facility];  Service: Orthopedics;  Laterality: Right;  . Total knee arthroplasty Left 10/15/2013    Procedure: LEFT TOTAL KNEE ARTHROPLASTY;  Surgeon: Gearlean Alf, MD;  Location: [facility];  Service: Orthopedics;  Laterality: Left;  . Cardioversion N/A 02/01/2014    Procedure: CARDIOVERSION;  Surgeon: Candee Furbish, MD;  Location: [facility] ENDOSCOPY;  Service: Cardiovascular;  Laterality: N/A;  . Tee without cardioversion N/A 02/01/2014  Procedure: TRANSESOPHAGEAL ECHOCARDIOGRAM (TEE);  Surgeon: Candee Furbish, MD;  Location: [facility] ENDOSCOPY;  Service: Cardiovascular;  Laterality: N/A;   Family History  Problem Relation Age of Onset  . Heart disease Mother   . Stroke Father   . Heart attack Neg Hx   . Stroke Mother   . Stroke Brother   . Hypertension Mother   . Hypertension Father    Social History  Substance Use Topics  . Smoking status: Former Smoker -- 0.00 packs/day for 30 years    Types: Cigarettes    Quit date: 04/21/1975  . Smokeless tobacco: Never Used  . Alcohol Use: Yes     Comment: occasionally   OB History    No  data available     Review of Systems  Constitutional: Negative for fever.  HENT: Negative for rhinorrhea and sore throat.   Eyes: Negative for redness.  Respiratory: Negative for cough.   Cardiovascular: Negative for chest pain.  Gastrointestinal: Negative for nausea, vomiting, abdominal pain and diarrhea.  Genitourinary: Negative for dysuria and frequency.  Musculoskeletal: Negative for myalgias.  Skin: Negative for rash.  Neurological: Positive for weakness. Negative for headaches.  Psychiatric/Behavioral: Positive for confusion.    Allergies  Lactose intolerance (gi); Milk-related compounds; and Sulfa antibiotics  Home Medications   Prior to Admission medications   Medication Sig Start Date End Date Taking? Authorizing Provider  anastrozole (ARIMIDEX) 1 MG tablet TAKE 1 TABLET (1 MG TOTAL) BY MOUTH DAILY. 12/06/14   Nicholas Lose, MD  cephALEXin (KEFLEX) 500 MG capsule Take 1 capsule (500 mg total) by mouth 2 (two) times daily. 02/10/15   Sharlett Iles, MD  flurazepam (DALMANE) 15 MG capsule Take 15 mg by mouth at bedtime as needed for sleep.  06/19/14   Historical Provider, MD  furosemide (LASIX) 20 MG tablet Take 20 mg by mouth 2 (two) times daily.     Historical Provider, MD  latanoprost (XALATAN) 0.005 % ophthalmic solution Place 1 drop into both eyes at bedtime.    Historical Provider, MD  Magnesium Oxide 200 MG TABS Take 1 tablet by mouth daily. For 5 days    Historical Provider, MD  metoprolol (LOPRESSOR) 100 MG tablet Take 1 tablet (100 mg total) by mouth 2 (two) times daily. 01/07/15   Troy Sine, MD  oxybutynin (DITROPAN) 5 MG tablet Take 5 mg by mouth 3 (three) times daily.     Historical Provider, MD  tamsulosin (FLOMAX) 0.4 MG CAPS capsule Take 0.4 mg by mouth daily.    Historical Provider, MD  traMADol (ULTRAM) 50 MG tablet Take 50 mg by mouth every 6 (six) hours as needed for moderate pain.    Historical Provider, MD  warfarin (COUMADIN) 5 MG tablet TAKE 1  TABLET (5 MG TOTAL) BY MOUTH DAILY. 12/05/14   Troy Sine, MD   BP 168/116 mmHg  Pulse 131  Temp(Src) 97.6 F (36.4 C)  Resp 28  SpO2 98%   Physical Exam  Constitutional: She appears well-developed and well-nourished.  HENT:  Head: Normocephalic and atraumatic.  Mouth/Throat: Oropharynx is clear and moist.  Eyes: Conjunctivae are normal. Right eye exhibits no discharge. Left eye exhibits no discharge.  Neck: Normal range of motion. Neck supple.  Cardiovascular: Normal heart sounds.  An irregularly irregular rhythm present. Tachycardia present.   No murmur heard. Pulmonary/Chest: Effort normal and breath sounds normal. No respiratory distress. She has no wheezes. She has no rales.  Abdominal: Soft. There is no tenderness. There is no  rebound and no guarding.  Neurological: She is alert.  Not oriented to date and time.  Skin: Skin is warm and dry.  Psychiatric: She has a normal mood and affect.  Nursing note and vitals reviewed.   ED Course  Procedures (including critical care time) Labs Review Labs Reviewed  COMPREHENSIVE METABOLIC PANEL - Abnormal; Notable for the following:    Glucose, Bld 141 (*)    BUN 22 (*)    Creatinine, Ser 1.14 (*)    AST 63 (*)    Alkaline Phosphatase 136 (*)    Total Bilirubin 2.0 (*)    GFR calc non Af Amer 42 (*)    GFR calc Af Amer 49 (*)    All other components within normal limits  CBC - Abnormal; Notable for the following:    RBC 5.13 (*)    All other components within normal limits  URINALYSIS, ROUTINE W REFLEX MICROSCOPIC (NOT AT [facility]) - Abnormal; Notable for the following:    APPearance CLOUDY (*)    Hgb urine dipstick LARGE (*)    Ketones, ur 15 (*)    Protein, ur >300 (*)    All other components within normal limits  PROTIME-INR - Abnormal; Notable for the following:    Prothrombin Time 17.1 (*)    All other components within normal limits  URINE MICROSCOPIC-ADD ON - Abnormal; Notable for the following:    Squamous  Epithelial / LPF FEW (*)    Bacteria, UA MANY (*)    Casts HYALINE CASTS (*)    All other components within normal limits  I-STAT CG4 LACTIC ACID, ED - Abnormal; Notable for the following:    Lactic Acid, Venous 3.76 (*)    All other components within normal limits  URINE CULTURE  CULTURE, BLOOD (ROUTINE X 2)  CULTURE, BLOOD (ROUTINE X 2)  I-STAT CG4 LACTIC ACID, ED    Imaging Review Dg Chest 2 View  02/12/2015  CLINICAL DATA:  Shortness of breath.  Weakness. EXAM: CHEST  2 VIEW COMPARISON:  11/15/2013 chest radiograph. FINDINGS: There is mild-to-moderate cardiomegaly, increased. Otherwise stable mediastinal silhouette. No pneumothorax. Small right pleural effusion. Trace left pleural effusion. Mild pulmonary edema. IMPRESSION: Mild-to-moderate cardiomegaly, increased, with mild pulmonary edema, most in keeping with mild congestive heart failure. Small right and trace left pleural effusions. Electronically Signed   By: Ilona Sorrel M.D.   On: 02/12/2015 11:07   Ct Head Wo Contrast  02/12/2015  CLINICAL DATA:  Altered mental status. On antibiotics for urinary tract infection. Hypertension and breast cancer. EXAM: CT HEAD WITHOUT CONTRAST TECHNIQUE: Contiguous axial images were obtained from the base of the skull through the vertex without intravenous contrast. COMPARISON:  MRI of 11/12/2013.  CT of 07/11/2013. FINDINGS: Sinuses/Soft tissues: Subtle gas suspected within the left side of the face (image 3, series 3). Also within the superior aspect left orbit, including within the superior ophthalmic vein. (image 21, series 3). Both ophthalmic veins are prominent, chronic. No significant soft tissue swelling. Partial opacification of left sphenoid sinus. Hyperostosis frontalis interna. Minimal fluid in right mastoid air cells. Question minimal air in the circle of Willis region. Intracranial: Moderate low density in the periventricular white matter likely related to small vessel disease. No mass  lesion, hemorrhage, hydrocephalus, acute infarct, intra-axial, or extra-axial fluid collection. IMPRESSION: 1.  No acute intracranial abnormality. 2. Moderate small vessel ischemic change. 3. Air about the left side of the face. Presuming the patient has had a recent IV, and no recent  trauma, this is all likely venous in origin and iatrogenic. If there has been no recent IV, and there is a history of trauma, posttraumatic subcutaneous air would be a concern and face CT suggested. 4.  Sinus disease and minimal right mastoid effusion. Electronically Signed   By: Abigail Miyamoto M.D.   On: 02/12/2015 11:43   I have personally reviewed and evaluated these images and lab results as part of my medical decision-making.   EKG Interpretation   Date/Time:  Wednesday February 12 2015 09:12:30 EST Ventricular Rate:  128 PR Interval:    QRS Duration: 107 QT Interval:  356 QTC Calculation: 519 R Axis:   -28 Text Interpretation:  Atrial fibrillation Ventricular tachycardia,  unsustained Borderline left axis deviation Repolarization abnormality,  prob rate related Abnormal ekg Atrial fibrillation Confirmed by Thomasene Lot,  COURTNEY ([id number]) on 02/12/2015 9:18:28 AM      9:59 AM Patient seen and examined. Work-up initiated. Medications ordered. Fluids ordered given elevated lactate. Also blood cultures. Will admit. Discussed with Dr. Canary Brim.   Vital signs reviewed and are as follows: BP 168/116 mmHg  Pulse 131  Temp(Src) 97.6 F (36.4 C)  Resp 28  SpO2 98%  10:56 AM Spoke with Chester Holstein NP who will see, admit.   CRITICAL CARE Performed by: Faustino Congress Total critical care time: 30 minutes Critical care time was exclusive of separately billable procedures and treating other patients. Critical care was necessary to treat or prevent imminent or life-threatening deterioration. Critical care was time spent personally by me on the following activities: development of treatment plan with patient and/or  surrogate as well as nursing, discussions with consultants, evaluation of patient's response to treatment, examination of patient, obtaining history from patient or surrogate, ordering and performing treatments and interventions, ordering and review of laboratory studies, ordering and review of radiographic studies, pulse oximetry and re-evaluation of patient's condition.  12:41 PM Patient seen previously by hospitalist team. Was called to bedside by RN. Patient was reportedly hypoxic into the 70s with a good waveform. She has responded well to oxygen by nasal cannula. She responds to voice but is a little more obtunded than earlier. Will recheck chest x-ray given her fluid load in setting of congestive heart failure. Patient seems to be stable at this time.  1:44 PM Patient stable on oxygen. Continuing to await repeat CXR.   3:12 PM It appears repeat CXR was discontinued. Unclear why. Reordered.    MDM   Final diagnoses:  Sepsis, due to unspecified organism [facility])  Urinary tract infection associated with catheterization of urinary tract, initial encounter   Admit for sepsis.     Carlisle Cater, PA-C 02/12/15 [place], MD 02/12/15 [phone]

## 2015-02-12 NOTE — Progress Notes (Signed)
CRITICAL VALUE ALERT  Critical value received:  Lactic Acid 2.1  Date of notification:  02-12-15  Time of notification:  1945  Critical value read back: Yes  Nurse who received alert:  Donalynn Furlong. Shelly Flatten, RN  MD notified (1st page):  Paged [facility]  Time of first page:  1947  MD notified (2nd page):  Time of second page:  Responding MD:   Time MD responded:

## 2015-02-12 NOTE — ED Notes (Addendum)
Pt arrives from home via [facility] for c/o altered mental status. Patient has known uti and is currently on abx for infection. Foley catheter in place upon arrival, foley was placed on Monday in the ED. Pt in a fib with hx of same. Pt alert and oriented x4.

## 2015-02-12 NOTE — H&P (Signed)
[facility] History and Physical  Kerston Landeck Wootan NUU:[id number] DOB: 18-May-1925 DOA: 02/12/2015  Referring physician: Emergency Department PCP: Myriam Jacobson, MD   CHIEF COMPLAINT: Confusion  HPI: Natasha Fletcher is a 81 y.o. female  who was in the emergency department 2 days ago with leakage of her chronic Foley and confusion. Urinalysis suspicious for urinary tract infection, Keflex prescribed, Foley catheter changed.. Patient apparently seen by her urologist following that, her Foley was exchanged for a second time. Patient represents to the emergency department today with persistent confusion, elevated lactic acid  ED COURSE:     Labs:   BUN mildly elevated at 22, creatinine 1.14., Normal anion gap. Alkaline phosphatase 136, AST 63, total bilirubin 2.0.  Urinalysis:   Cloudy, many bacteria, hyaline casts, 15 ketones, negative nitrites, 11-20 WBCs.  CXR:   Pending  EKG:    Atrial fibrillation Ventricular tachycardia, unsustained Borderline left axis deviation Repolarization abnormality, prob rate related  Medications  sodium chloride 0.9 % bolus 500 mL (500 mLs Intravenous New Bag/Given 02/12/15 1006)  sodium chloride 0.9 % bolus 1,000 mL (1,000 mLs Intravenous New Bag/Given 02/12/15 1005)  cefTRIAXone (ROCEPHIN) 1 g in dextrose 5 % 50 mL IVPB (not administered)   Review of Systems  Unable to perform ROS   Past Medical History  Diagnosis Date  . Hypertension   . Asthma   . Glaucoma   . Incontinence   . Wears dentures   . Wears glasses   . Thyroid disease     "something years ago"  . GERD (gastroesophageal reflux disease)   . Cancer (HCC)     rt breast  . Edema leg   . Fast breathing     "because of pill"  . Pneumonia     h/o younger  . Arthritis     "all over"  . S/P total knee arthroplasty   . Glaucoma   . Swelling     bilateral feet/ legs, more left.  . Atrial fibrillation [facility])    Past Surgical History  Procedure Laterality Date  .  Total hip arthroplasty Bilateral   . Shoulder surgery Left   . Appendectomy    . Carpal tunnel release Bilateral   . Tonsillectomy    . Colonoscopy    . Abdominal hysterectomy    . Eye surgery      both cataracts  . Breast biopsy  04/27/2011    Procedure: BREAST BIOPSY WITH NEEDLE LOCALIZATION;  Surgeon: Edward Jolly, MD;  Location: [place];  Service: General;  Laterality: Right;  right needle localized breast lumpectomy   . Breast lumpectomy  04/27/11    right breast by [facility]  . Total knee arthroplasty Right 02/19/2013    Procedure: RIGHT TOTAL KNEE ARTHROPLASTY;  Surgeon: Gearlean Alf, MD;  Location: [facility];  Service: Orthopedics;  Laterality: Right;  . Total knee arthroplasty Left 10/15/2013    Procedure: LEFT TOTAL KNEE ARTHROPLASTY;  Surgeon: Gearlean Alf, MD;  Location: [facility];  Service: Orthopedics;  Laterality: Left;  . Cardioversion N/A 02/01/2014    Procedure: CARDIOVERSION;  Surgeon: Candee Furbish, MD;  Location: [facility] ENDOSCOPY;  Service: Cardiovascular;  Laterality: N/A;  . Tee without cardioversion N/A 02/01/2014    Procedure: TRANSESOPHAGEAL ECHOCARDIOGRAM (TEE);  Surgeon: Candee Furbish, MD;  Location: [facility] ENDOSCOPY;  Service: Cardiovascular;  Laterality: N/A;    SOCIAL HISTORY:  reports that she quit smoking about 39 years ago. Her smoking use included Cigarettes. She smoked 0.00 packs  per day for 30 years. She has never used smokeless tobacco. She reports that she drinks alcohol. She reports that she does not use illicit drugs. Lives:  At home    With: brother  Assistive devices:   Unknown at this point, family not in room.   Allergies  Allergen Reactions  . Lactose Intolerance (Gi)   . Milk-Related Compounds Other (See Comments)    Upset stomach   . Sulfa Antibiotics Other (See Comments)    Reaction many years ago-unknown    Family History  Problem Relation Age of Onset  . Heart disease Mother   . Stroke Father   . Heart attack Neg Hx     . Stroke Mother   . Stroke Brother   . Hypertension Mother   . Hypertension Father     Prior to Admission medications   Medication Sig Start Date End Date Taking? Authorizing Provider  anastrozole (ARIMIDEX) 1 MG tablet TAKE 1 TABLET (1 MG TOTAL) BY MOUTH DAILY. 12/06/14   Nicholas Lose, MD  cephALEXin (KEFLEX) 500 MG capsule Take 1 capsule (500 mg total) by mouth 2 (two) times daily. 02/10/15   Sharlett Iles, MD  flurazepam (DALMANE) 15 MG capsule Take 15 mg by mouth at bedtime as needed for sleep.  06/19/14   Historical Provider, MD  furosemide (LASIX) 20 MG tablet Take 20 mg by mouth 2 (two) times daily.     Historical Provider, MD  latanoprost (XALATAN) 0.005 % ophthalmic solution Place 1 drop into both eyes at bedtime.    Historical Provider, MD  Magnesium Oxide 200 MG TABS Take 1 tablet by mouth daily. For 5 days    Historical Provider, MD  metoprolol (LOPRESSOR) 100 MG tablet Take 1 tablet (100 mg total) by mouth 2 (two) times daily. 01/07/15   Troy Sine, MD  oxybutynin (DITROPAN) 5 MG tablet Take 5 mg by mouth 3 (three) times daily.     Historical Provider, MD  tamsulosin (FLOMAX) 0.4 MG CAPS capsule Take 0.4 mg by mouth daily.    Historical Provider, MD  traMADol (ULTRAM) 50 MG tablet Take 50 mg by mouth every 6 (six) hours as needed for moderate pain.    Historical Provider, MD  warfarin (COUMADIN) 5 MG tablet TAKE 1 TABLET (5 MG TOTAL) BY MOUTH DAILY. 12/05/14   Troy Sine, MD   PHYSICAL EXAMDanley Danker Vitals:   02/12/15 0913 02/12/15 0945 02/12/15 1001  BP: 168/116 159/115 163/113  Pulse: 131  116  Temp: 97.6 F (36.4 C)    Resp: 28 25 18   SpO2: 98%  99%    Wt Readings from Last 3 Encounters:  01/17/15 64.864 kg (143 lb)  01/07/15 65.772 kg (145 lb)  12/21/14 64.864 kg (143 lb)    General:  Pleasant black  female. Appears calm and comfortable Eyes: PER, normal lids, irises & conjunctiva ENT: grossly normal hearing, lips & tongue Neck: no LAD, no  masses Cardiovascular: Irregular rate and rhythm, no murmurs. No LE edema.  Respiratory: Respirations even and unlabored. Normal respiratory effort. Lungs CTA bilaterally, no wheezes / rales .   Abdomen: soft, non-distended, non-tender, active bowel sounds. No obvious masses.  Skin: no rash seen on limited exam Musculoskeletal: grossly normal tone BUE/BLE Psychiatric: grossly normal mood and affect, speech fluent and appropriate Neurologic: grossly non-focal.         LABS ON ADMISSION:    Basic Metabolic Panel:  Recent Labs Lab 02/10/15 1500 02/12/15 0942  NA 139 144  K 5.0 4.0  CL 107 107  CO2 22 22  GLUCOSE 92 141*  BUN 14 22*  CREATININE 0.92 1.14*  CALCIUM 9.5 10.0   Liver Function Tests:  Recent Labs Lab 02/12/15 0942  AST 63*  ALT 52  ALKPHOS 136*  BILITOT 2.0*  PROT 7.4  ALBUMIN 3.8    CBC:  Recent Labs Lab 02/10/15 1500 02/12/15 0942  WBC 4.5 6.5  NEUTROABS 2.9  --   HGB 12.5 14.0  HCT 39.2 42.8  MCV 86.0 83.4  PLT 201 211    ASSESSMENT / PLAN   Sepsis (elevated lactic acid, tachypnea, tachycardia, acute encephalopathy) likely secondary to UTI.  Seen in ED 2 days ago for catheter leakage and confusion, UTI suspected, foley catheter changed , started on Keflex,  Saw Urology in follow up, foley changed again. Represents to ED today with persistent confusion.  -Admit to stepdown sepsis, presumably secondary to UTI -Reculture urine -Blood cultures pending -Chest x-ray pending -Fluid resuscitation per ED (2 L) -Follow lactic acid levels -Continue Rocephin started in the emergency department  -May need Urology consult if continues to have foley problems.   Atrial fibrillation with RVR. Tachycardia may also be secondary to sepsis and dehydration. ChadsVasc score 5.  Maintained on Coumadin. INR subtherapeutic.  -Continue Coumadin, will consult pharmacy for dosing -Continue beta blocker -Telemetry bed -Add cardizem drip  Hypertension. BP  significantly elevated.  -Continue home Lopressor.  -Add prn Hydralazine  -Cardizem as above    Chronic diastolic heart failure, mild pulmonary edema. Small right and left pleural effusions -Hold home lasix, reassess in am .   CONSULTANTS:   none  Code Status: DNR since 2015. Will contact family to reconfirm DVT Prophylaxis: SCDs. On Coumadin Family Communication:   Will contact brother with whom patient lives Disposition Plan: Discharge to home or possible nursing facility in 24-48 hours.    Time spent: 60 minutes Tye Savoy  NP [facility] Pager [phone]

## 2015-02-12 NOTE — ED Notes (Signed)
Spoke with Tye Savoy, NP regarding need for bed request for patient.

## 2015-02-12 NOTE — ED Notes (Signed)
Attempted report, nurse unavailable, nurse to call this RN back when she is available.

## 2015-02-12 NOTE — ED Notes (Signed)
Radiology at bedside

## 2015-02-13 ENCOUNTER — Encounter: Payer: Self-pay | Admitting: Cardiovascular Disease

## 2015-02-13 DIAGNOSIS — N390 Urinary tract infection, site not specified: Secondary | ICD-10-CM

## 2015-02-13 DIAGNOSIS — A419 Sepsis, unspecified organism: Secondary | ICD-10-CM | POA: Diagnosis present

## 2015-02-13 LAB — BASIC METABOLIC PANEL
ANION GAP: 9 (ref 5–15)
BUN: 18 mg/dL (ref 6–20)
CO2: 22 mmol/L (ref 22–32)
Calcium: 8.5 mg/dL — ABNORMAL LOW (ref 8.9–10.3)
Chloride: 108 mmol/L (ref 101–111)
Creatinine, Ser: 0.96 mg/dL (ref 0.44–1.00)
GFR calc Af Amer: 60 mL/min — ABNORMAL LOW (ref >60)
GFR, EST NON AFRICAN AMERICAN: 52 mL/min — AB (ref >60)
GLUCOSE: 96 mg/dL (ref 65–99)
POTASSIUM: 3.5 mmol/L (ref 3.5–5.1)
Sodium: 139 mmol/L (ref 135–145)

## 2015-02-13 LAB — URINE CULTURE
CULTURE: NO GROWTH
SPECIAL REQUESTS: NORMAL

## 2015-02-13 LAB — PROTIME-INR
INR: 1.38 (ref 0.00–1.49)
Prothrombin Time: 17.1 seconds — ABNORMAL HIGH (ref 11.6–15.2)

## 2015-02-13 MED ORDER — LORAZEPAM 2 MG/ML IJ SOLN
0.5000 mg | Freq: Once | INTRAMUSCULAR | Status: AC
  Administered 2015-02-13: 0.5 mg via INTRAVENOUS
  Filled 2015-02-13: qty 1

## 2015-02-13 MED ORDER — DILTIAZEM HCL 30 MG PO TABS
30.0000 mg | ORAL_TABLET | Freq: Four times a day (QID) | ORAL | Status: DC
  Administered 2015-02-13 – 2015-02-14 (×3): 30 mg via ORAL
  Filled 2015-02-13 (×3): qty 1

## 2015-02-13 MED ORDER — CEFUROXIME AXETIL 250 MG PO TABS
250.0000 mg | ORAL_TABLET | Freq: Two times a day (BID) | ORAL | Status: DC
  Administered 2015-02-13 – 2015-02-17 (×9): 250 mg via ORAL
  Filled 2015-02-13 (×11): qty 1

## 2015-02-13 MED ORDER — WARFARIN SODIUM 7.5 MG PO TABS
7.5000 mg | ORAL_TABLET | Freq: Once | ORAL | Status: AC
  Administered 2015-02-13: 7.5 mg via ORAL
  Filled 2015-02-13: qty 1

## 2015-02-13 MED ORDER — HALOPERIDOL LACTATE 5 MG/ML IJ SOLN
2.0000 mg | Freq: Once | INTRAMUSCULAR | Status: AC
Start: 2015-02-13 — End: 2015-02-13
  Administered 2015-02-13: 2 mg via INTRAVENOUS
  Filled 2015-02-13: qty 1

## 2015-02-13 MED ORDER — FUROSEMIDE 20 MG PO TABS
20.0000 mg | ORAL_TABLET | Freq: Two times a day (BID) | ORAL | Status: DC
  Administered 2015-02-13 – 2015-02-16 (×6): 20 mg via ORAL
  Filled 2015-02-13 (×7): qty 1

## 2015-02-13 NOTE — Progress Notes (Signed)
At 1108 pt HR fluctuating 50's-60's, 56-64 remaining afib/flutter. cardizem at 10mg /hr, decreased rate to 5mg /hr; monitored. At 1215 pt HR consistently staying in 50's, cardizem stopped at that time.  At 1315 HR currently 52-62.  Text message being sent to Dr Sherral Hammers at this time to notify of above.

## 2015-02-13 NOTE — Clinical Social Work Note (Signed)
Clinical Social Work Assessment  Patient Details  Name: Natasha Fletcher MRN: [id number] Date of Birth: 1925-05-27  Date of referral:  02/13/15               Reason for consult:  Discharge Planning                Permission sought to share information with:  Facility Sport and exercise psychologist, Family Supports Permission granted to share information::  Yes, Verbal Permission Granted  Name::     Administrator, sports::  SNFs  Relationship::     Contact Information:     Housing/Transportation Living arrangements for the past 2 months:  [place] of Information:  Other (Comment Required) (Brother Engineering geologist) Patient Interpreter Needed:  None Criminal Activity/Legal Involvement Pertinent to Current Situation/Hospitalization:  No - Comment as needed Significant Relationships:  Siblings Lives with:  Self Do you feel safe going back to the place where you live?  Yes Need for family participation in patient care:  Yes (Comment)  Care giving concerns:  Patient and family requesting SNF placement at discharge.   Social Worker assessment / plan:  CSW spoke with patient's brother Natasha Fletcher regarding short term rehab placement for the patient at discharge. Per Natasha Fletcher the patient has been to several SNFs in the past but prefers [facility]. CSW explained SNF search/placement process and answered Natasha Fletcher's questions regarding placement. CSW will followup with available bed offers.   Employment status:  Retired Nurse, adult PT Recommendations:  Not assessed at this time Information / Referral to community resources:  Natasha Fletcher  Patient/Family's Response to care:  Patient and family appear happy with the care the patient has been receiving.   Patient/Family's Understanding of and Emotional Response to Diagnosis, Current Treatment, and Prognosis:  Per brother, the patient's mentation is improving. Brother Natasha Fletcher appears to have a good understanding of  why the patient was admitted and what her post DC needs will be.   Emotional Assessment Appearance:  Appears stated age Attitude/Demeanor/Rapport:  Other (Appropriate) Affect (typically observed):  Appropriate, Calm Orientation:  Oriented to Self, Oriented to Place, Oriented to  Time Alcohol / Substance use:  Alcohol Use, Tobacco Use (Hx of tobacco) Psych involvement (Current and /or in the community):  No (Comment)  Discharge Needs  Concerns to be addressed:  Discharge Planning Concerns Readmission within the last 30 days:  No Current discharge risk:  Chronically ill, Physical Impairment, Cognitively Impaired Barriers to Discharge:  Continued Medical Work up   [employer] MSW, [place], [place], [id number]

## 2015-02-13 NOTE — Progress Notes (Signed)
ANTICOAGULATION CONSULT NOTE - Follow Up Consult  Pharmacy Consult for Coumadin Indication: atrial fibrillation  Allergies  Allergen Reactions  . Lactose Intolerance (Gi)   . Milk-Related Compounds Other (See Comments)    Upset stomach   . Sulfa Antibiotics Other (See Comments)    Reaction many years ago-unknown    Patient Measurements: Weight: 140 lb (63.504 kg)  Vital Signs: Temp: 98.2 F (36.8 C) (11/10 0750) Temp Source: Oral (11/10 0750) BP: 132/66 mmHg (11/10 0750) Pulse Rate: 89 (11/10 0750)  Labs:  Recent Labs  02/10/15 1500 02/10/15 1558 02/12/15 0942 02/13/15 0251  HGB 12.5  --  14.0  --   HCT 39.2  --  42.8  --   PLT 201  --  211  --   LABPROT  --  15.0 17.1* 17.1*  INR  --  1.17 1.38 1.38  CREATININE 0.92  --  1.14* 0.96    Estimated Creatinine Clearance: 36.4 mL/min (by C-G formula based on Cr of 0.96).  Assessment: 89yof continues on coumadin for afib. INR subtherapeutic and unchanged from admit at 1.38. No bleeding reported.  PTA dose per last anticoag clinic visit 10/27: 5mg  daily except 7.5mg  on Fridays  Goal of Therapy:  INR 2-3 Monitor platelets by anticoagulation protocol: Yes   Plan:  1) Coumadin 7.5mg  x 1 2) Daily INR  Deboraha Sprang 02/13/2015,8:42 AM

## 2015-02-13 NOTE — Progress Notes (Signed)
Dr Sherral Hammers returned call, agree to leave cardizem off.

## 2015-02-13 NOTE — Progress Notes (Signed)
Initial Nutrition Assessment  DOCUMENTATION CODES:   Not applicable  INTERVENTION:  Ensure Enlive po BID, each supplement provides 350 kcal and 20 grams of protein  NUTRITION DIAGNOSIS:   Inadequate oral intake related to poor appetite as evidenced by per patient/family report, percent weight loss.  GOAL:   Patient will meet greater than or equal to 90% of their needs  MONITOR:   PO intake, I & O's, Labs, Supplement acceptance  REASON FOR ASSESSMENT:   Malnutrition Screening Tool    ASSESSMENT:   Natasha Fletcher is a 81 y.o. female with a Past Medical History of hypertension, asthma, glaucoma, urinary incontinence, GERD, right breast cancer, functionally dependent edema, A. fib who presents with sepsis, A. Fib with RVR. Sepsis likely from urinary source  Spoke to pt at bedside. Admitted to poor appetite for 1-2 years, says she just eats her meals because she knows she has to.  Exhibits 13#/8.5% insignificant wt loss in 8 months  Pt has never tried ensure or boost at home, will provide strawberry ensure.  No other barriers to nutrition  Follow for Supplement acceptance and PO intake  Labs and Medications reviewed. Diet Order:  Diet Heart Room service appropriate?: Yes; Fluid consistency:: Thin  Skin:  Reviewed, no issues  Last BM:  02/11/2015  Height:   Ht Readings from Last 1 Encounters:  01/17/15 5' 4.5" (1.638 m)    Weight:   Wt Readings from Last 1 Encounters:  02/12/15 140 lb (63.504 kg)    Ideal Body Weight:  56.81 kg  BMI:  Body mass index is 23.67 kg/(m^2).  Estimated Nutritional Needs:   Kcal:  1600-1900 calories  Protein:  65-80 grams  Fluid:  Per MD rec.  EDUCATION NEEDS:   No education needs identified at this time  Satira Anis. [name], MS, RD LDN After Hours/Weekend Pager [phone]

## 2015-02-13 NOTE — Clinical Documentation Improvement (Signed)
Internal Medicine  Based on the clinical findings below, please document any associated diagnoses/conditions the patient has or may have.   Catheter associated UTI  Other  Clinically Undetermined  Supporting Information: -- Hx indwelling Foley catheter for urinary incontinence -- Admitted with UTI from SNF -- Catheter changed, IVABX  Please exercise your independent, professional judgment when responding. A specific answer is not anticipated or expected.  Thank You, Ezekiel Ina RN [place] [phone]

## 2015-02-13 NOTE — Clinical Social Work Placement (Signed)
   CLINICAL SOCIAL WORK PLACEMENT  NOTE  Date:  02/13/2015  Patient Details  Name: Natasha Fletcher MRN: [id number] Date of Birth: 28-Jan-1926  Clinical Social Work is seeking post-discharge placement for this patient at the [place] level of care (*CSW will initial, date and re-position this form in  chart as items are completed):  Yes   Patient/family provided with [facility] Work Department's list of facilities offering this level of care within the geographic area requested by the patient (or if unable, by the patient's family).  Yes   Patient/family informed of their freedom to choose among providers that offer the needed level of care, that participate in Medicare, Medicaid or managed care program needed by the patient, have an available bed and are willing to accept the patient.  Yes   Patient/family informed of [facility]'s ownership interest in [facility] and [facility], as well as of the fact that they are under no obligation to receive care at these facilities.  PASRR submitted to EDS on       PASRR number received on       Existing PASRR number confirmed on 02/13/15     FL2 transmitted to all facilities in geographic area requested by pt/family on 02/13/15     FL2 transmitted to all facilities within larger geographic area on       Patient informed that his/her managed care company has contracts with or will negotiate with certain facilities, including the following:            Patient/family informed of bed offers received.  Patient chooses bed at       Physician recommends and patient chooses bed at      Patient to be transferred to   on  .  Patient to be transferred to facility by       Patient family notified on   of transfer.  Name of family member notified:        PHYSICIAN Please prepare priority discharge summary, including medications, Please prepare prescriptions, Please sign FL2, Please sign DNR      Additional Comment:    _______________________________________________ Liz Beach MSW, [place], [place], [id number]

## 2015-02-13 NOTE — Progress Notes (Signed)
[facility] TEAM 1 - Stepdown/ICU TEAM Progress Note  Natasha Fletcher [id number] DOB: Dec 26, 1925 DOA: 02/12/2015 PCP: Myriam Jacobson, MD  Admit HPI / Brief Narrative: Natasha Fletcher is a 81 y.o. BF PMHx  in the emergency department 2 days ago with leakage of her chronic Foley and confusion. Urinalysis suspicious for urinary tract infection, Keflex prescribed, Foley catheter changed.. Patient apparently seen by her urologist following that, her Foley was exchanged for a second time. Patient represents to the emergency department today with persistent confusion, elevated lactic acid  HPI/Subjective: 11/10 A/O 1 (does not know where, why, when). Very pleasant follows all commands. Negative CP, negative SOB, negative N/V.  Assessment/Plan:  Sepsis due to urinary tract infection -(elevated lactic acid, tachypnea, tachycardia, acute encephalopathy) likely secondary to UTI. Seen in ED 2 days ago for catheter leakage and confusion, UTI suspected, foley catheter changed , started on Keflex, Saw Urology in follow up, foley changed again. Represents to ED today with persistent confusion.  -urine/blood cultures NGTD -CXR; nondiagnostic for infective cause  -Continue Rocephin started in the emergency department -Currently no leakage from Foley.   Altered mental status -Most lightly multifactorial to include dementia, acute infection -PT/OT consult pending SNF?  Atrial fibrillation with RVR.  -Currently in NSR - ChadsVasc score 5.  - INR continues to be subtherapeutic.  -Continue Coumadin,  Per pharmacy dosing -Continue metoprolol 100 mg BID -Changed to Cardizem IR 30 mg QID  Hypertension.  -BP significantly elevated.  -See atrial fibrillation.  -Continue  PRN Hydralazine    Chronic diastolic heart failure,  -mild pulmonary edema. Small right and left pleural effusions -Restart home Lasix 20 mg BID     Code Status:  DO NOT RESUSCITATE Family Communication:  no family present at time of exam Disposition Plan: Discharge in next 24-48 ours    Consultants:   Procedure/Significant Events:    Culture  11/9 urine negative final  11/9 blood right AC /arm  NGTD  11/9 MRSA by PCR negative  Antibiotics: Ceftriaxone 11/9>> DC 11/10 Ceftin 11/10>>   DVT prophylaxis: Warfarin   Devices    LINES / TUBES:      Continuous Infusions: . sodium chloride 75 mL/hr at 02/13/15 1828    Objective: VITAL SIGNS: Temp: 97.6 F (36.4 C) (11/10 1621) Temp Source: Oral (11/10 1621) BP: 122/73 mmHg (11/10 1621) Pulse Rate: 69 (11/10 1621) SPO2; FIO2:   Intake/Output Summary (Last 24 hours) at 02/13/15 1936 Last data filed at 02/13/15 1800  Gross per 24 hour  Intake 2401.92 ml  Output    765 ml  Net 1636.92 ml     Exam: General:A/O 1 (does not know where, why, when). Very pleasant follows all commands, NAD No acute respiratory distress Eyes: Negative headache, negative scleral hemorrhage ENT: Negative Runny nose, negative gingival bleeding, Neck:  Negative scars, masses, torticollis, lymphadenopathy, JVD Lungs: Clear to auscultation bilaterally without wheezes or crackles Cardiovascular: Regular rate and rhythm without murmur gallop or rub normal S1 and S2 Abdomen: Positive suprapubic abdominal pain, positive right CVA tenderness, nondistended, positive soft, bowel sounds, no rebound, no ascites, no appreciable mass Extremities: No significant cyanosis, clubbing, or edema bilateral lower extremities Psychiatric:  Negative depression, negative anxiety, negative fatigue, negative mania  Neurologic:  Cranial nerves II through XII intact, tongue/uvula midline, all extremities muscle strength 5/5, sensation intact throughout, negative dysarthria, negative expressive aphasia, negative receptive aphasia.   Data Reviewed: Basic Metabolic Panel:  Recent Labs Lab 02/10/15 1500 02/12/15 0942 02/13/15 0251  NA 139 144 139  K 5.0 4.0  3.5  CL 107 107 108  CO2 22 22 22   GLUCOSE 92 141* 96  BUN 14 22* 18  CREATININE 0.92 1.14* 0.96  CALCIUM 9.5 10.0 8.5*   Liver Function Tests:  Recent Labs Lab 02/12/15 0942  AST 63*  ALT 52  ALKPHOS 136*  BILITOT 2.0*  PROT 7.4  ALBUMIN 3.8   No results for input(s): LIPASE, AMYLASE in the last 168 hours. No results for input(s): AMMONIA in the last 168 hours. CBC:  Recent Labs Lab 02/10/15 1500 02/12/15 0942  WBC 4.5 6.5  NEUTROABS 2.9  --   HGB 12.5 14.0  HCT 39.2 42.8  MCV 86.0 83.4  PLT 201 211   Cardiac Enzymes: No results for input(s): CKTOTAL, CKMB, CKMBINDEX, TROPONINI in the last 168 hours. BNP (last 3 results) No results for input(s): BNP in the last 8760 hours.  ProBNP (last 3 results) No results for input(s): PROBNP in the last 8760 hours.  CBG:  Recent Labs Lab 02/12/15 1420  GLUCAP 126*    Recent Results (from the past 240 hour(s))  Urine culture     Status: None   Collection Time: 02/10/15  2:30 PM  Result Value Ref Range Status   Specimen Description URINE, CATHETERIZED  Final   Special Requests Normal  Final   Culture MULTIPLE SPECIES PRESENT, SUGGEST RECOLLECTION  Final   Report Status 02/11/2015 FINAL  Final  Urine culture     Status: None   Collection Time: 02/12/15  9:30 AM  Result Value Ref Range Status   Specimen Description URINE, CATHETERIZED  Final   Special Requests Normal  Final   Culture NO GROWTH 1 DAY  Final   Report Status 02/13/2015 FINAL  Final  Blood Culture (routine x 2)     Status: None (Preliminary result)   Collection Time: 02/12/15 10:10 AM  Result Value Ref Range Status   Specimen Description BLOOD RIGHT ANTECUBITAL  Final   Special Requests BOTTLES DRAWN AEROBIC AND ANAEROBIC 10CC  Final   Culture NO GROWTH 1 DAY  Final   Report Status PENDING  Incomplete  Blood Culture (routine x 2)     Status: None (Preliminary result)   Collection Time: 02/12/15 10:20 AM  Result Value Ref Range Status   Specimen  Description BLOOD RIGHT ARM  Final   Special Requests BOTTLES DRAWN AEROBIC AND ANAEROBIC 10CC  Final   Culture NO GROWTH 1 DAY  Final   Report Status PENDING  Incomplete  MRSA PCR Screening     Status: None   Collection Time: 02/12/15  4:50 PM  Result Value Ref Range Status   MRSA by PCR NEGATIVE NEGATIVE Final    Comment:        The GeneXpert MRSA Assay (FDA approved for NASAL specimens only), is one component of a comprehensive MRSA colonization surveillance program. It is not intended to diagnose MRSA infection nor to guide or monitor treatment for MRSA infections.      Studies:  Recent x-ray studies have been reviewed in detail by the Attending Physician  Scheduled Meds:  Scheduled Meds: . anastrozole  1 mg Oral Daily  . cefUROXime  250 mg Oral BID WC  . diltiazem  30 mg Oral 4 times per day  . feeding supplement  1 Container Oral TID BM  . furosemide  20 mg Oral BID  . latanoprost  1 drop Both Eyes QHS  . metoprolol  100 mg Oral  BID  . oxybutynin  5 mg Oral TID  . sodium chloride  3 mL Intravenous Q12H  . tamsulosin  0.4 mg Oral Daily  . Warfarin - Pharmacist Dosing Inpatient   Does not apply q1800    Time spent on care of this patient: 40 mins   WOODS, Geraldo Docker , MD  [facility] Office  [phone] Pager - [phone]  On-Call/Text Page:      [email or web address]      password [email or web address]  If 7PM-7AM, please contact night-coverage [email or web address] Password [email or web address] 02/13/2015, 7:36 PM   LOS: 1 day   Care during the described time interval was provided by me .  I have reviewed this patient's available data, including medical history, events of note, physical examination, and all test results as part of my evaluation. I have personally reviewed and interpreted all radiology studies.   Dia Crawford, MD [phone] Pager

## 2015-02-13 NOTE — Progress Notes (Signed)
Patient very confused trying to get out of bed, very angry and combative, RN called a family member to try and help calm patient down, did not work, called the doc, orders to give ativan .5mg  once, patient still very combative trying to get up and pull at lines, orders to give 2mg  haldol once. Will continue to monitor closely.

## 2015-02-14 DIAGNOSIS — R410 Disorientation, unspecified: Secondary | ICD-10-CM

## 2015-02-14 LAB — PROTIME-INR
INR: 1.55 — ABNORMAL HIGH (ref 0.00–1.49)
Prothrombin Time: 18.7 seconds — ABNORMAL HIGH (ref 11.6–15.2)

## 2015-02-14 MED ORDER — WARFARIN SODIUM 7.5 MG PO TABS
7.5000 mg | ORAL_TABLET | Freq: Once | ORAL | Status: AC
  Administered 2015-02-14: 7.5 mg via ORAL
  Filled 2015-02-14: qty 1

## 2015-02-14 MED ORDER — DILTIAZEM HCL 30 MG PO TABS
30.0000 mg | ORAL_TABLET | Freq: Three times a day (TID) | ORAL | Status: DC
  Administered 2015-02-14 – 2015-02-15 (×2): 30 mg via ORAL
  Filled 2015-02-14 (×2): qty 1

## 2015-02-14 MED ORDER — HALOPERIDOL LACTATE 5 MG/ML IJ SOLN
2.0000 mg | Freq: Once | INTRAMUSCULAR | Status: AC
Start: 2015-02-14 — End: 2015-02-14
  Administered 2015-02-14: 2 mg via INTRAVENOUS
  Filled 2015-02-14: qty 1

## 2015-02-14 NOTE — Clinical Social Work Note (Signed)
Authorization started for SNF for patient to go to [facility].   Liz Beach MSW, [place], [place], [id number]

## 2015-02-14 NOTE — Progress Notes (Signed)
ANTICOAGULATION CONSULT NOTE - Follow Up Consult  Pharmacy Consult for Coumadin Indication: atrial fibrillation  Allergies  Allergen Reactions  . Lactose Intolerance (Gi) Other (See Comments)  . Milk-Related Compounds Other (See Comments)    Upset stomach   . Sulfa Antibiotics Other (See Comments)    Reaction many years ago-unknown    Patient Measurements: Weight: 140 lb (63.504 kg)  Vital Signs: Temp: 97.9 F (36.6 C) (11/11 0750) Temp Source: Oral (11/11 0750) BP: 143/80 mmHg (11/11 0750) Pulse Rate: 78 (11/11 0750)  Labs:  Recent Labs  02/12/15 0942 02/13/15 0251 02/14/15 0303  HGB 14.0  --   --   HCT 42.8  --   --   PLT 211  --   --   LABPROT 17.1* 17.1* 18.7*  INR 1.38 1.38 1.55*  CREATININE 1.14* 0.96  --     Estimated Creatinine Clearance: 36.4 mL/min (by C-G formula based on Cr of 0.96).  Assessment: 89yof continues on coumadin for afib. INR is subtherapeutic but beginning to trend up to 1.55. No bleeding reported.  PTA dose per last anticoag clinic visit 10/27: 5mg  daily except 7.5mg  on Fridays  Goal of Therapy:  INR 2-3 Monitor platelets by anticoagulation protocol: Yes   Plan:  1) Coumadin 7.5mg  x 1 2) Daily INR  Deboraha Sprang 02/14/2015,9:21 AM

## 2015-02-14 NOTE — Evaluation (Signed)
Physical Therapy Evaluation Patient Details Name: JENESE LOPEZMARTINEZ MRN: [id number] DOB: 26-Apr-1925 Today's Date: 02/14/2015   History of Present Illness  pt presents with UTI and Sepsis.  pt with hx of HTN, Glaucoma, Breast CA, and A-fib.    Clinical Impression  Pt globally weak and deconditioned.  Pt able to participate in mobility and follows simple directions well, but MaxA needed for simply coming to sit at EOB.  Feel pt will need SNF level of care at D/C and brothers that were present are in agreement.  Will continue to follow.      Follow Up Recommendations SNF    Equipment Recommendations  None recommended by PT    Recommendations for Other Services       Precautions / Restrictions Precautions Precautions: Fall Restrictions Weight Bearing Restrictions: No      Mobility  Bed Mobility Overal bed mobility: Needs Assistance Bed Mobility: Supine to Sit;Sit to Supine     Supine to sit: Max assist;HOB elevated Sit to supine: Max assist   General bed mobility comments: pt attempts to A with mobility, but extensive A needed to come to sitting.    Transfers                    Ambulation/Gait                Stairs            Wheelchair Mobility    Modified Rankin (Stroke Patients Only)       Balance Overall balance assessment: Needs assistance Sitting-balance support: Bilateral upper extremity supported;Feet supported Sitting balance-Leahy Scale: Poor Sitting balance - Comments: pt with posterior and L lateral lean.  pt indicates feeling that she is leaning, but unable to fix balance.                                       Pertinent Vitals/Pain Pain Assessment: No/denies pain    Home Living Family/patient expects to be discharged to:: Skilled nursing facility                      Prior Function Level of Independence: Needs assistance   Gait / Transfers Assistance Needed: Uses a cane.  ADL's / Homemaking  Assistance Needed: I with ADLs, but family perform homemaking tasks.          Hand Dominance        Extremity/Trunk Assessment   Upper Extremity Assessment: Generalized weakness           Lower Extremity Assessment: Generalized weakness      Cervical / Trunk Assessment: Kyphotic  Communication   Communication: HOH  Cognition Arousal/Alertness: Awake/alert Behavior During Therapy: WFL for tasks assessed/performed Overall Cognitive Status: Impaired/Different from baseline Area of Impairment: Orientation;Attention;Memory;Following commands;Safety/judgement;Awareness;Problem solving Orientation Level: Disoriented to;Situation;Time Current Attention Level: Sustained Memory: Decreased short-term memory Following Commands: Follows one step commands with increased time;Follows multi-step commands inconsistently Safety/Judgement: Decreased awareness of safety;Decreased awareness of deficits Awareness: Intellectual Problem Solving: Slow processing;Decreased initiation;Difficulty sequencing;Requires verbal cues;Requires tactile cues      General Comments      Exercises        Assessment/Plan    PT Assessment Patient needs continued PT services  PT Diagnosis Difficulty walking;Generalized weakness   PT Problem List Decreased strength;Decreased activity tolerance;Decreased balance;Decreased mobility;Decreased coordination;Decreased cognition;Decreased knowledge of use of DME;Decreased safety awareness  PT Treatment  Interventions DME instruction;Gait training;Functional mobility training;Therapeutic activities;Therapeutic exercise;Balance training;Neuromuscular re-education;Cognitive remediation;Patient/family education   PT Goals (Current goals can be found in the Care Plan section) Acute Rehab PT Goals Patient Stated Goal: Per brothers for pt to get rehab prior to returning to home.   PT Goal Formulation: With patient/family Time For Goal Achievement: 02/28/15 Potential  to Achieve Goals: Good    Frequency Min 2X/week   Barriers to discharge        Co-evaluation               End of Session   Activity Tolerance: Patient limited by fatigue Patient left: in bed;with call bell/phone within reach;with bed alarm set Nurse Communication: Mobility status         Time: [id number] PT Time Calculation (min) (ACUTE ONLY): 22 min   Charges:   PT Evaluation $Initial PT Evaluation Tier I: 1 Procedure     PT G CodesCatarina Hartshorn, [place] 02/14/2015, 3:11 PM

## 2015-02-14 NOTE — Care Management Important Message (Signed)
Important Message  Patient Details  Name: Natasha Fletcher MRN: [id number] Date of Birth: 02-Apr-1926   Medicare Important Message Given:  Yes    [name] [name] 02/14/2015, 2:16 PM

## 2015-02-14 NOTE — Progress Notes (Signed)
TEAM 1 - Stepdown/ICU TEAM PROGRESS NOTE  Natasha Fletcher [id number] DOB: 1926/03/03 DOA: 02/12/2015 PCP: Myriam Jacobson, MD  Admit HPI / Brief Narrative: 81 y.o. F with a history of hypertension, glaucoma, chronic urinary incontinence with chronic Foley catheter, and chronic atrial fibrillation who was seen in the ED2 days prior to this admission with leakage of her chronic Foley and confusion. Urinalysis suspicious for urinary tract infection, Keflex prescribed, Foley catheter changed.  Patient was then seen by her Urologist the following day, when her Foley was exchanged for a second time. Patient returned to the ED with persistent confusion.  HPI/Subjective: Patient became very confused and agitated last night.  At the time of my visit this afternoon she is alert and conversant and very pleasant though only mildly confused.  She denies chest pain shortness breath fevers chills nausea or vomiting.  Assessment/Plan:  Sepsis due to urinary tract infection -tachycardia, tachypnea, + UTI at time of admission -urine/blood cultures NGTD -CXR nondiagnostic for infective cause  -Continue empiric antibiotic therapy  Altered mental status -Most lightly multifactorial to include dementia + acute infection + sundowning  -PT/OT consult  -Transfer out of ICU asap  Chronic Atrial fibrillation with acute RVR -ChadsVasc score 5 - continue Coumadin per pharmacy  -Rate presently well controlled  Hypertension.  -BP elevated - adjust tx plan and follow    Chronic diastolic heart failure,  -small right and left pleural effusions not clinically significant  -cont home Lasix 20 mg BID   Code Status: NO CODE  Family Communication: no family present at time of exam Disposition Plan: Transfer to telemetry bed - PT/OT evaluations - anticipate d/c to SNF ~48-72hrs if remains clinically stable  Consultants: none  Procedures: none  Antibiotics: Ceftriaxone  11/9>11/10 Ceftin 11/10>  DVT prophylaxis: Warfarin   Objective: Blood pressure 145/101, pulse 74, temperature 97.2 F (36.2 C), temperature source Oral, resp. rate 18, weight 63.504 kg (140 lb), SpO2 96 %.  Intake/Output Summary (Last 24 hours) at 02/14/15 1501 Last data filed at 02/14/15 1400  Gross per 24 hour  Intake   2885 ml  Output    910 ml  Net   1975 ml   Exam: General: No acute respiratory distress Lungs: Clear to auscultation bilaterally without wheezes or crackles Cardiovascular: Irregularly irregular with controlled rate without appreciable murmur Abdomen: Nontender, nondistended, soft, bowel sounds positive, no rebound, no ascites, no appreciable mass Extremities: No significant cyanosis, clubbing;  trace edema bilateral lower extremities  Data Reviewed: Basic Metabolic Panel:  Recent Labs Lab 02/10/15 1500 02/12/15 0942 02/13/15 0251  NA 139 144 139  K 5.0 4.0 3.5  CL 107 107 108  CO2 22 22 22   GLUCOSE 92 141* 96  BUN 14 22* 18  CREATININE 0.92 1.14* 0.96  CALCIUM 9.5 10.0 8.5*   CBC:  Recent Labs Lab 02/10/15 1500 02/12/15 0942  WBC 4.5 6.5  NEUTROABS 2.9  --   HGB 12.5 14.0  HCT 39.2 42.8  MCV 86.0 83.4  PLT 201 211    Liver Function Tests:  Recent Labs Lab 02/12/15 0942  AST 63*  ALT 52  ALKPHOS 136*  BILITOT 2.0*  PROT 7.4  ALBUMIN 3.8   Coags:  Recent Labs Lab 02/10/15 1558 02/12/15 0942 02/13/15 0251 02/14/15 0303  INR 1.17 1.38 1.38 1.55*   CBG:  Recent Labs Lab 02/12/15 1420  GLUCAP 126*    Recent Results (from the past 240 hour(s))  Urine culture  Status: None   Collection Time: 02/10/15  2:30 PM  Result Value Ref Range Status   Specimen Description URINE, CATHETERIZED  Final   Special Requests Normal  Final   Culture MULTIPLE SPECIES PRESENT, SUGGEST RECOLLECTION  Final   Report Status 02/11/2015 FINAL  Final  Urine culture     Status: None   Collection Time: 02/12/15  9:30 AM  Result Value Ref  Range Status   Specimen Description URINE, CATHETERIZED  Final   Special Requests Normal  Final   Culture NO GROWTH 1 DAY  Final   Report Status 02/13/2015 FINAL  Final  Blood Culture (routine x 2)     Status: None (Preliminary result)   Collection Time: 02/12/15 10:10 AM  Result Value Ref Range Status   Specimen Description BLOOD RIGHT ANTECUBITAL  Final   Special Requests BOTTLES DRAWN AEROBIC AND ANAEROBIC 10CC  Final   Culture NO GROWTH 2 DAYS  Final   Report Status PENDING  Incomplete  Blood Culture (routine x 2)     Status: None (Preliminary result)   Collection Time: 02/12/15 10:20 AM  Result Value Ref Range Status   Specimen Description BLOOD RIGHT ARM  Final   Special Requests BOTTLES DRAWN AEROBIC AND ANAEROBIC 10CC  Final   Culture NO GROWTH 2 DAYS  Final   Report Status PENDING  Incomplete  MRSA PCR Screening     Status: None   Collection Time: 02/12/15  4:50 PM  Result Value Ref Range Status   MRSA by PCR NEGATIVE NEGATIVE Final    Comment:        The GeneXpert MRSA Assay (FDA approved for NASAL specimens only), is one component of a comprehensive MRSA colonization surveillance program. It is not intended to diagnose MRSA infection nor to guide or monitor treatment for MRSA infections.      Studies:   Recent x-ray studies have been reviewed in detail by the Attending Physician  Scheduled Meds:  Scheduled Meds: . anastrozole  1 mg Oral Daily  . cefUROXime  250 mg Oral BID WC  . diltiazem  30 mg Oral 4 times per day  . feeding supplement  1 Container Oral TID BM  . furosemide  20 mg Oral BID  . latanoprost  1 drop Both Eyes QHS  . metoprolol  100 mg Oral BID  . oxybutynin  5 mg Oral TID  . sodium chloride  3 mL Intravenous Q12H  . tamsulosin  0.4 mg Oral Daily  . warfarin  7.5 mg Oral ONCE-1800  . Warfarin - Pharmacist Dosing Inpatient   Does not apply q1800    Time spent on care of this patient: 35 mins   [facility] T , MD   [facility] Office  [phone] Pager - Text Page per Amion as per below:  On-Call/Text Page:      [email or web address]      password [email or web address]  If 7PM-7AM, please contact night-coverage [email or web address] Password [email or web address] 02/14/2015, 3:01 PM   LOS: 2 days

## 2015-02-14 NOTE — Progress Notes (Signed)
Assessed patient this am, her bed was wet and found to have urine pooled between her legs, upon inspection foley catheter was leaking at bag/tubing connection site. Seal had been removed.  Due to chronic catheter use exchanged foley that was in place with a new 16 french foley, using sterile technique, she was alert and oriented and stated the catheter was leaking.  She tolerated procedure well.  Urine returned in new foley cath was milky in appearance with sediment present.  Will continue to monitor closely. Complete bath and linen change completed.

## 2015-02-14 NOTE — NC FL2 (Signed)
[place] MEDICAID FL2 LEVEL OF CARE SCREENING TOOL     IDENTIFICATION  [name] Birthdate: 12/18/1925 Sex: female Admission Date (Current Location): 02/12/2015  [facility] and [place] Number: Herbalist and Address:  [facility], [place] [address] [place], [place] [address]      Provider Number: [id number]  Attending Physician Name and Address:  Allie Bossier, MD  Relative Name and Phone Number:       Current Level of Care: Hospital Recommended Level of Care: [place] Prior Approval Number:    Date Approved/Denied:   PASRR Number: [id number]  Discharge Plan: SNF    Current Diagnoses: Patient Active Problem List   Diagnosis Date Noted  . Sepsis due to urinary tract infection ([place])   . Sepsis ([place]) 02/12/2015  . Urinary tract infectious disease   . Long-term (current) use of anticoagulants 07/12/2014  . Chronic anticoagulation 02/11/2014  . Hypertensive cardiovascular disease 01/24/2014  . Chronic diastolic CHF (congestive heart failure) ([place]) 01/24/2014  . Anemia 01/24/2014  . Breast cancer of upper-inner quadrant of right female breast ([place]) 01/24/2014  . Anticoagulated 12/31/2013  . Acute diastolic heart failure ([place]) 11/11/2013  . Atrial flutter ([place]) 11/11/2013  . PAF (paroxysmal atrial fibrillation) ([place]) 11/10/2013  . Slurred speech 11/10/2013  . Protein-calorie malnutrition, severe ([place]) 11/06/2013  . Coffee ground emesis 11/05/2013  . Dehydration 10/19/2013  . Urinary retention 10/18/2013  . GERD (gastroesophageal reflux disease) 03/20/2013  . S/P total knee arthroplasty   . UTI (urinary tract infection) 02/21/2013  . OA (osteoarthritis) of knee 02/19/2013  . Hypertension   . Thyroid disease   . Cancer of central portion of female breast ([place]) 04/14/2011    Orientation ACTIVITIES/SOCIAL BLADDER RESPIRATION    Self, Time, Situation, Place    Incontinent (chronic catheter) Normal   BEHAVIORAL SYMPTOMS/MOOD NEUROLOGICAL BOWEL NUTRITION STATUS  Other (Comment) (NONE)        PHYSICIAN VISITS COMMUNICATION OF NEEDS Height & Weight Skin    Verbally   140 lbs.            AMBULATORY STATUS RESPIRATION    Assist extensive Normal      Personal Care Assistance Level of Assistance  Bathing, Dressing Bathing Assistance: Limited assistance Feeding assistance: Limited assistance Dressing Assistance: Limited assistance      Functional Limitations Info                [place]  PT (By licensed PT), OT (By licensed OT)     PT Frequency: daily OT Frequency: daily           Additional Factors Info  Code Status, Allergies Code Status Info: DNR Allergies Info: lactose intolerant; sulfa antibiotics           Current Medications (02/14/2015): Current Facility-Administered Medications  Medication Dose Route Frequency Provider Last Rate Last Dose  . 0.9 %  sodium chloride infusion   Intravenous Continuous Willia Craze, NP 75 mL/hr at 02/13/15 2000    . acetaminophen (TYLENOL) tablet 650 mg  650 mg Oral Q6H PRN Willia Craze, NP       Or  . acetaminophen (TYLENOL) suppository 650 mg  650 mg Rectal Q6H PRN Willia Craze, NP      . anastrozole (ARIMIDEX) tablet 1 mg  1 mg Oral Daily Willia Craze, NP   1 mg at 02/13/15 1001  . bisacodyl (DULCOLAX) suppository 10 mg  10 mg  Rectal Daily PRN Willia Craze, NP      . cefUROXime (CEFTIN) tablet 250 mg  250 mg Oral BID WC Allie Bossier, MD   250 mg at 02/13/15 1959  . diltiazem (CARDIZEM) tablet 30 mg  30 mg Oral 4 times per day Allie Bossier, MD   30 mg at 02/14/15 0457  . feeding supplement (BOOST / RESOURCE BREEZE) liquid 1 Container  1 Container Oral TID BM Willia Craze, NP   1 Container at 02/13/15 2000  . furosemide (LASIX) tablet 20 mg  20 mg Oral BID Allie Bossier, MD   20 mg at 02/13/15 1959  . hydrALAZINE (APRESOLINE) injection 10 mg  10 mg Intravenous Q6H PRN  Willia Craze, NP      . latanoprost (XALATAN) 0.005 % ophthalmic solution 1 drop  1 drop Both Eyes QHS Willia Craze, NP   1 drop at 02/13/15 2200  . metoprolol tartrate (LOPRESSOR) tablet 100 mg  100 mg Oral BID Willia Craze, NP   100 mg at 02/13/15 2122  . ondansetron (ZOFRAN) tablet 4 mg  4 mg Oral Q6H PRN Willia Craze, NP       Or  . ondansetron [facility]) injection 4 mg  4 mg Intravenous Q6H PRN Willia Craze, NP      . oxybutynin (DITROPAN) tablet 5 mg  5 mg Oral TID Willia Craze, NP   5 mg at 02/13/15 2122  . sodium chloride 0.9 % injection 3 mL  3 mL Intravenous Q12H Willia Craze, NP   3 mL at 02/13/15 2200  . tamsulosin (FLOMAX) capsule 0.4 mg  0.4 mg Oral Daily Willia Craze, NP   0.4 mg at 02/13/15 1001  . traMADol (ULTRAM) tablet 50 mg  50 mg Oral Q6H PRN Willia Craze, NP   50 mg at 02/14/15 0457  . Warfarin - Pharmacist Dosing Inpatient   Does not apply [place] Baird, RPH   0  at 02/12/15 1800  . zolpidem (AMBIEN) tablet 5 mg  5 mg Oral QHS PRN Lauren D Bajbus, RPH       Do not use this list as official medication orders. Please verify with discharge summary.  Discharge Medications:   Medication List    ASK your doctor about these medications        anastrozole 1 MG tablet  Commonly known as:  ARIMIDEX  TAKE 1 TABLET (1 MG TOTAL) BY MOUTH DAILY.     cephALEXin 500 MG capsule  Commonly known as:  KEFLEX  Take 1 capsule (500 mg total) by mouth 2 (two) times daily.     furosemide 20 MG tablet  Commonly known as:  LASIX  Take 20 mg by mouth 2 (two) times daily.     latanoprost 0.005 % ophthalmic solution  Commonly known as:  XALATAN  Place 1 drop into both eyes at bedtime.     metoprolol 100 MG tablet  Commonly known as:  LOPRESSOR  Take 1 tablet (100 mg total) by mouth 2 (two) times daily.     traMADol 50 MG tablet  Commonly known as:  ULTRAM  Take 50 mg by mouth every 6 (six) hours as needed for moderate pain.     warfarin 5  MG tablet  Commonly known as:  COUMADIN  TAKE 1 TABLET (5 MG TOTAL) BY MOUTH DAILY.        Relevant Imaging Results:  Relevant Lab Results:  Recent Labs    Additional Information SSN [id number]  Rigoberto Noel, LCSW

## 2015-02-14 NOTE — Progress Notes (Signed)
Pt very agitated and confused, trying to get out of bed, orders to give haldol, will continue to monitor closely.

## 2015-02-15 DIAGNOSIS — I10 Essential (primary) hypertension: Secondary | ICD-10-CM

## 2015-02-15 DIAGNOSIS — I480 Paroxysmal atrial fibrillation: Secondary | ICD-10-CM

## 2015-02-15 DIAGNOSIS — A419 Sepsis, unspecified organism: Secondary | ICD-10-CM

## 2015-02-15 DIAGNOSIS — N390 Urinary tract infection, site not specified: Secondary | ICD-10-CM

## 2015-02-15 DIAGNOSIS — I5032 Chronic diastolic (congestive) heart failure: Secondary | ICD-10-CM

## 2015-02-15 LAB — CBC
HCT: 39.4 % (ref 36.0–46.0)
Hemoglobin: 12.9 g/dL (ref 12.0–15.0)
MCH: 27.2 pg (ref 26.0–34.0)
MCHC: 32.7 g/dL (ref 30.0–36.0)
MCV: 83.1 fL (ref 78.0–100.0)
PLATELETS: 171 10*3/uL (ref 150–400)
RBC: 4.74 MIL/uL (ref 3.87–5.11)
RDW: 14.5 % (ref 11.5–15.5)
WBC: 7.4 10*3/uL (ref 4.0–10.5)

## 2015-02-15 LAB — PROTIME-INR
INR: 1.81 — ABNORMAL HIGH (ref 0.00–1.49)
PROTHROMBIN TIME: 21 s — AB (ref 11.6–15.2)

## 2015-02-15 LAB — COMPREHENSIVE METABOLIC PANEL
ALT: 78 U/L — AB (ref 14–54)
AST: 115 U/L — AB (ref 15–41)
Albumin: 3.1 g/dL — ABNORMAL LOW (ref 3.5–5.0)
Alkaline Phosphatase: 104 U/L (ref 38–126)
Anion gap: 10 (ref 5–15)
BILIRUBIN TOTAL: 1.9 mg/dL — AB (ref 0.3–1.2)
BUN: 9 mg/dL (ref 6–20)
CALCIUM: 8.6 mg/dL — AB (ref 8.9–10.3)
CO2: 28 mmol/L (ref 22–32)
CREATININE: 0.95 mg/dL (ref 0.44–1.00)
Chloride: 98 mmol/L — ABNORMAL LOW (ref 101–111)
GFR calc Af Amer: >60 mL/min (ref >60)
GFR, EST NON AFRICAN AMERICAN: 52 mL/min — AB (ref >60)
Glucose, Bld: 100 mg/dL — ABNORMAL HIGH (ref 65–99)
POTASSIUM: 2.9 mmol/L — AB (ref 3.5–5.1)
Sodium: 136 mmol/L (ref 135–145)
TOTAL PROTEIN: 6.1 g/dL — AB (ref 6.5–8.1)

## 2015-02-15 LAB — POTASSIUM: Potassium: 3.2 mmol/L — ABNORMAL LOW (ref 3.5–5.1)

## 2015-02-15 LAB — MAGNESIUM: MAGNESIUM: 1.2 mg/dL — AB (ref 1.7–2.4)

## 2015-02-15 MED ORDER — DILTIAZEM HCL ER COATED BEADS 120 MG PO CP24
120.0000 mg | ORAL_CAPSULE | Freq: Once | ORAL | Status: AC
  Administered 2015-02-15: 120 mg via ORAL
  Filled 2015-02-15: qty 1

## 2015-02-15 MED ORDER — POTASSIUM CHLORIDE CRYS ER 20 MEQ PO TBCR
40.0000 meq | EXTENDED_RELEASE_TABLET | Freq: Once | ORAL | Status: AC
  Administered 2015-02-15: 40 meq via ORAL
  Filled 2015-02-15: qty 2

## 2015-02-15 MED ORDER — BOOST / RESOURCE BREEZE PO LIQD: 1.0000 | Freq: Three times a day (TID) | ORAL | Status: DC

## 2015-02-15 MED ORDER — WARFARIN SODIUM 7.5 MG PO TABS
7.5000 mg | ORAL_TABLET | Freq: Once | ORAL | Status: AC
  Administered 2015-02-15: 7.5 mg via ORAL
  Filled 2015-02-15: qty 1

## 2015-02-15 MED ORDER — POTASSIUM CHLORIDE 10 MEQ/100ML IV SOLN
10.0000 meq | INTRAVENOUS | Status: AC
  Administered 2015-02-15 (×2): 10 meq via INTRAVENOUS
  Filled 2015-02-15 (×2): qty 100

## 2015-02-15 MED ORDER — DILTIAZEM HCL ER COATED BEADS 120 MG PO CP24
120.0000 mg | ORAL_CAPSULE | Freq: Every day | ORAL | Status: DC
  Administered 2015-02-15: 120 mg via ORAL
  Filled 2015-02-15: qty 1

## 2015-02-15 MED ORDER — DILTIAZEM HCL ER COATED BEADS 240 MG PO CP24
240.0000 mg | ORAL_CAPSULE | Freq: Every day | ORAL | Status: DC
  Filled 2015-02-15: qty 1

## 2015-02-15 MED ORDER — DILTIAZEM HCL 100 MG IV SOLR
5.0000 mg/h | INTRAVENOUS | Status: DC
  Administered 2015-02-15: 5 mg/h via INTRAVENOUS
  Filled 2015-02-15: qty 100

## 2015-02-15 MED ORDER — POTASSIUM CHLORIDE CRYS ER 20 MEQ PO TBCR: 20.0000 meq | EXTENDED_RELEASE_TABLET | Freq: Once | ORAL | Status: DC

## 2015-02-15 MED ORDER — DILTIAZEM HCL ER COATED BEADS 240 MG PO CP24: 240.0000 mg | ORAL_CAPSULE | Freq: Every day | ORAL | Status: DC

## 2015-02-15 MED ORDER — DILTIAZEM HCL ER COATED BEADS 120 MG PO CP24: 120.0000 mg | ORAL_CAPSULE | Freq: Every day | ORAL | Status: DC

## 2015-02-15 MED ORDER — MAGNESIUM SULFATE 2 GM/50ML IV SOLN
2.0000 g | Freq: Once | INTRAVENOUS | Status: AC
  Administered 2015-02-15: 2 g via INTRAVENOUS
  Filled 2015-02-15: qty 50

## 2015-02-15 NOTE — Discharge Summary (Signed)
Physician Discharge Summary  Natasha Fletcher Henery [id number] DOB: [birth year] DOA: 02/12/2015  PCP: Myriam Jacobson, MD  Admit date: 02/12/2015 Discharge date: 02/15/2015  Recommendations for Outpatient Follow-up:  1. Pt will need to follow up with PCP in 2 weeks post discharge 2. Please obtain BMP on 02/19/15 3. Please check INR on 02/17/2015 and adjust warfarin dose accordingly for INR 2-3 Discharge Diagnoses:  Sepsis due to urinary tract infection/CAUTI -tachycardia, tachypnea, + UTI at time of admission -urine/blood cultures NGTD--as the patient was on antibiotics prior to admission -CXR nondiagnostic for infective cause  -Initially started on ceftriaxone at the time of admission -Switched to cefuroxime 250 mg twice a day--plan four additional days to complete 7 days of therapy -Continue empiric antibiotic therapy -Foley catheter changed on 02/11/2015  Acute Encephalopathy -Most lightly multifactorial to include dementia + acute infection + sundowning  -PT consult --recommended skilled nursing facility -Transfer out of ICU asap -The patient remained clinically stable on the medical floor and her mental status continued to improve  Chronic Atrial fibrillation with acute RVR -ChadsVasc score 5 - continue Coumadin per pharmacy  -Rate presently well controlled -The patient will continue on warfarin after discharge -Please check INR on 02/17/2015 and adjust Coumadin dose accordingly for INR 2-3 -Continue metoprolol tartrate 100 mg twice a day -Diltiazem CD 120 mg daily was started  Hypertension.  -BP elevated - adjust tx plan and follow  -Continue metoprolol tartrate -Diltiazem CD was added during the hospitalization   Chronic diastolic heart failure,  -small right and left pleural effusions not clinically significant--98-99% oxygen saturation on RA -cont home Lasix 20 mg BID  -01/17/2015 echocardiogram--EF 55% to percent -otherwise clinically  euvolemic -Continue metoprolol tartrate 100 mg twice a day  Discharge Condition: Stable  Disposition:  skilled nursing facility  Diet: Heart healthy Wt Readings from Last 3 Encounters:  02/12/15 63.504 kg (140 lb)  01/17/15 64.864 kg (143 lb)  01/07/15 65.772 kg (145 lb)    History of present illness:  81 y.o. F with a history of hypertension, glaucoma, chronic urinary incontinence with chronic Foley catheter, and chronic atrial fibrillation who was seen in the ED 2 days (02/10/2015) prior to this admission with leakage of her chronic Foley and confusion. Urinalysis suspicious for urinary tract infection (21-50 WBC), Keflex prescribed, Foley catheter changed. Patient was then seen by her Urologist the following day, when her Foley was exchanged for a second time. Patient returned to the ED with persistent confusion. The patient was started on ceftriaxone on 02/12/2015. She was switched to cefuroxime on 02/13/2015. The patient's mental status continued to improve. She remained afebrile and hemodynamically stable. Her diet was advanced which she tolerated. The patient was started on diltiazem to control her atrial fibrillation heart rate. She will be discharged on metoprolol tartrate 100 mg twice a day and diltiazem CD 120 mg daily  Discharge Exam: Filed Vitals:   02/15/15 0657  BP: 147/67  Pulse:   Temp:   Resp:    Filed Vitals:   02/14/15 2111 02/14/15 2112 02/15/15 0603 02/15/15 0657  BP: 167/85 167/85 147/67 147/67  Pulse: 88  72   Temp: 99 F (37.2 C)  98.3 F (36.8 C)   TempSrc: Oral  Oral   Resp: 15  15   Weight:      SpO2: 99%  98%    General: A&O x 3, NAD, pleasant, cooperative Cardiovascular: RRR, no rub, no gallop, no S3 Respiratory: CTAB, no wheeze, no rhonchi Abdomen:soft, nontender, nondistended,  positive bowel sounds Extremities: No edema, No lymphangitis, no petechiae  Discharge Instructions      Discharge Instructions    Diet - low sodium heart healthy     Complete by:  As directed      Increase activity slowly    Complete by:  As directed             Medication List    STOP taking these medications        cephALEXin 500 MG capsule  Commonly known as:  KEFLEX      TAKE these medications        anastrozole 1 MG tablet  Commonly known as:  ARIMIDEX  TAKE 1 TABLET (1 MG TOTAL) BY MOUTH DAILY.     diltiazem 120 MG 24 hr capsule  Commonly known as:  CARDIZEM CD  Take 1 capsule (120 mg total) by mouth daily.     feeding supplement Liqd  Take 1 Container by mouth 3 (three) times daily between meals.     furosemide 20 MG tablet  Commonly known as:  LASIX  Take 20 mg by mouth 2 (two) times daily.     latanoprost 0.005 % ophthalmic solution  Commonly known as:  XALATAN  Place 1 drop into both eyes at bedtime.     metoprolol 100 MG tablet  Commonly known as:  LOPRESSOR  Take 1 tablet (100 mg total) by mouth 2 (two) times daily.     potassium chloride SA 20 MEQ tablet  Commonly known as:  K-DUR,KLOR-CON  Take 1 tablet (20 mEq total) by mouth once. X 5 days  Start taking on:  02/16/2015     traMADol 50 MG tablet  Commonly known as:  ULTRAM  Take 50 mg by mouth every 6 (six) hours as needed for moderate pain.     warfarin 5 MG tablet  Commonly known as:  COUMADIN  TAKE 1 TABLET (5 MG TOTAL) BY MOUTH DAILY.         The results of significant diagnostics from this hospitalization (including imaging, microbiology, ancillary and laboratory) are listed below for reference.    Significant Diagnostic Studies: Dg Chest 2 View  02/12/2015  CLINICAL DATA:  Shortness of breath.  Weakness. EXAM: CHEST  2 VIEW COMPARISON:  11/15/2013 chest radiograph. FINDINGS: There is mild-to-moderate cardiomegaly, increased. Otherwise stable mediastinal silhouette. No pneumothorax. Small right pleural effusion. Trace left pleural effusion. Mild pulmonary edema. IMPRESSION: Mild-to-moderate cardiomegaly, increased, with mild pulmonary edema, most  in keeping with mild congestive heart failure. Small right and trace left pleural effusions. Electronically Signed   By: Ilona Sorrel M.D.   On: 02/12/2015 11:07   Ct Head Wo Contrast  02/12/2015  CLINICAL DATA:  Altered mental status. On antibiotics for urinary tract infection. Hypertension and breast cancer. EXAM: CT HEAD WITHOUT CONTRAST TECHNIQUE: Contiguous axial images were obtained from the base of the skull through the vertex without intravenous contrast. COMPARISON:  MRI of 11/12/2013.  CT of 07/11/2013. FINDINGS: Sinuses/Soft tissues: Subtle gas suspected within the left side of the face (image 3, series 3). Also within the superior aspect left orbit, including within the superior ophthalmic vein. (image 21, series 3). Both ophthalmic veins are prominent, chronic. No significant soft tissue swelling. Partial opacification of left sphenoid sinus. Hyperostosis frontalis interna. Minimal fluid in right mastoid air cells. Question minimal air in the circle of Willis region. Intracranial: Moderate low density in the periventricular white matter likely related to small vessel disease. No mass lesion,  hemorrhage, hydrocephalus, acute infarct, intra-axial, or extra-axial fluid collection. IMPRESSION: 1.  No acute intracranial abnormality. 2. Moderate small vessel ischemic change. 3. Air about the left side of the face. Presuming the patient has had a recent IV, and no recent trauma, this is all likely venous in origin and iatrogenic. If there has been no recent IV, and there is a history of trauma, posttraumatic subcutaneous air would be a concern and face CT suggested. 4.  Sinus disease and minimal right mastoid effusion. Electronically Signed   By: Abigail Miyamoto M.D.   On: 02/12/2015 11:43   Dg Chest Portable 1 View  02/12/2015  CLINICAL DATA:  81 year old female with altered mental status and shortness of breath. Low oxygen saturations. EXAM: PORTABLE CHEST 1 VIEW COMPARISON:  Chest x-ray 02/12/2015.  FINDINGS: There is cephalization of the pulmonary vasculature and slight indistinctness of the interstitial markings suggestive of mild pulmonary edema. Small bilateral pleural effusions. Mild cardiomegaly. The patient is rotated to the left on today's exam, resulting in distortion of the mediastinal contours and reduced diagnostic sensitivity and specificity for mediastinal pathology. Atherosclerosis in the thoracic aorta. IMPRESSION: 1. The appearance the chest suggests mild congestive heart failure, as above. 2. Atherosclerosis. Electronically Signed   By: Vinnie Langton M.D.   On: 02/12/2015 16:08     Microbiology: Recent Results (from the past 240 hour(s))  Urine culture     Status: None   Collection Time: 02/10/15  2:30 PM  Result Value Ref Range Status   Specimen Description URINE, CATHETERIZED  Final   Special Requests Normal  Final   Culture MULTIPLE SPECIES PRESENT, SUGGEST RECOLLECTION  Final   Report Status 02/11/2015 FINAL  Final  Urine culture     Status: None   Collection Time: 02/12/15  9:30 AM  Result Value Ref Range Status   Specimen Description URINE, CATHETERIZED  Final   Special Requests Normal  Final   Culture NO GROWTH 1 DAY  Final   Report Status 02/13/2015 FINAL  Final  Blood Culture (routine x 2)     Status: None (Preliminary result)   Collection Time: 02/12/15 10:10 AM  Result Value Ref Range Status   Specimen Description BLOOD RIGHT ANTECUBITAL  Final   Special Requests BOTTLES DRAWN AEROBIC AND ANAEROBIC 10CC  Final   Culture NO GROWTH 3 DAYS  Final   Report Status PENDING  Incomplete  Blood Culture (routine x 2)     Status: None (Preliminary result)   Collection Time: 02/12/15 10:20 AM  Result Value Ref Range Status   Specimen Description BLOOD RIGHT ARM  Final   Special Requests BOTTLES DRAWN AEROBIC AND ANAEROBIC 10CC  Final   Culture NO GROWTH 3 DAYS  Final   Report Status PENDING  Incomplete  MRSA PCR Screening     Status: None   Collection Time:  02/12/15  4:50 PM  Result Value Ref Range Status   MRSA by PCR NEGATIVE NEGATIVE Final    Comment:        The GeneXpert MRSA Assay (FDA approved for NASAL specimens only), is one component of a comprehensive MRSA colonization surveillance program. It is not intended to diagnose MRSA infection nor to guide or monitor treatment for MRSA infections.      Labs: Basic Metabolic Panel:  Recent Labs Lab 02/10/15 1500 02/12/15 0942 02/13/15 0251 02/15/15 0620  NA 139 144 139 136  K 5.0 4.0 3.5 2.9*  CL 107 107 108 98*  CO2 22 22 22  28  GLUCOSE 92 141* 96 100*  BUN 14 22* 18 9  CREATININE 0.92 1.14* 0.96 0.95  CALCIUM 9.5 10.0 8.5* 8.6*   Liver Function Tests:  Recent Labs Lab 02/12/15 0942 02/15/15 0620  AST 63* 115*  ALT 52 78*  ALKPHOS 136* 104  BILITOT 2.0* 1.9*  PROT 7.4 6.1*  ALBUMIN 3.8 3.1*   No results for input(s): LIPASE, AMYLASE in the last 168 hours. No results for input(s): AMMONIA in the last 168 hours. CBC:  Recent Labs Lab 02/10/15 1500 02/12/15 0942 02/15/15 0620  WBC 4.5 6.5 7.4  NEUTROABS 2.9  --   --   HGB 12.5 14.0 12.9  HCT 39.2 42.8 39.4  MCV 86.0 83.4 83.1  PLT 201 211 171   Cardiac Enzymes: No results for input(s): CKTOTAL, CKMB, CKMBINDEX, TROPONINI in the last 168 hours. BNP: Invalid input(s): POCBNP CBG:  Recent Labs Lab 02/12/15 1420  GLUCAP 126*    Time coordinating discharge:  Greater than 30 minutes  Signed:  Keeghan Mcintire, DO [facility] Pager: [phone] 02/15/2015, 1:34 PM

## 2015-02-15 NOTE — Progress Notes (Addendum)
notifed by RN pt's HR transiently going up to 170.  Reviewed telemetry.  HR going to 170s for 1-2 seconds, then back down to 100-130.   Pt having Afib with RVR.  Pt is asymptomatic.  BP stable Give additional dose of diltiazem CD 120 mg for total of 240mg  and increase maintenance dose to 240mg  daily. If HR remains elevated will try IV metoprolol.  Cancelled discharge  DTat

## 2015-02-15 NOTE — Progress Notes (Signed)
ANTICOAGULATION CONSULT NOTE - Follow Up Consult  Pharmacy Consult for Coumadin Indication: atrial fibrillation  Allergies  Allergen Reactions  . Lactose Intolerance (Gi) Other (See Comments)  . Milk-Related Compounds Other (See Comments)    Upset stomach   . Sulfa Antibiotics Other (See Comments)    Reaction many years ago-unknown    Patient Measurements: Weight: 140 lb (63.504 kg)  Vital Signs: Temp: 98.3 F (36.8 C) (11/12 0603) Temp Source: Oral (11/12 0603) BP: 147/67 mmHg (11/12 0657) Pulse Rate: 72 (11/12 0603)  Labs:  Recent Labs  02/12/15 0942 02/13/15 0251 02/14/15 0303 02/15/15 0620  HGB 14.0  --   --  12.9  HCT 42.8  --   --  39.4  PLT 211  --   --  171  LABPROT 17.1* 17.1* 18.7* 21.0*  INR 1.38 1.38 1.55* 1.81*  CREATININE 1.14* 0.96  --  0.95    Estimated Creatinine Clearance: 36.8 mL/min (by C-G formula based on Cr of 0.95).  Assessment: Natasha Fletcher continues on coumadin for afib. INR is subtherapeutic but beginning to trend up to 1.55>1.81. No bleeding reported.  PTA dose per last anticoag clinic visit 10/27: 5mg  daily except 7.5mg  on Fridays  Goal of Therapy:  INR 2-3 Monitor platelets by anticoagulation protocol: Yes   Plan:  Coumadin 7.5mg  PO x1 tonight Daily INR Monitor CBC, s/sx bleeding Judieth Keens 02/15/2015,8:31 AM

## 2015-02-15 NOTE — Progress Notes (Signed)
Report called to receiving nurse, Heidi RN on 3[facility]. Family notified of transfer

## 2015-02-15 NOTE — Progress Notes (Signed)
Orders received for K+ level and Mag level. K+ came back at 3.2 and Mag came back at 1.2, results paged to Kathline Magic NP.

## 2015-02-15 NOTE — Progress Notes (Signed)
Patient has heart rate 49-52 still a-fib, b/p 84/50 . Kathline Magic PA notified of cardizem drip off at this time. NNO at this time. [facility] [employer]

## 2015-02-15 NOTE — Progress Notes (Signed)
Kathline Magic NP was paged and made aware that patient transferred from [facility] with Afib RVR and having frequent PVC'S. Her K+ level was 2.9 this am and was given a 1 time dose of 61meq po and no mag level on file. [facility] [employer]

## 2015-02-16 DIAGNOSIS — D696 Thrombocytopenia, unspecified: Secondary | ICD-10-CM

## 2015-02-16 DIAGNOSIS — E876 Hypokalemia: Secondary | ICD-10-CM

## 2015-02-16 LAB — CBC
HEMATOCRIT: 38.2 % (ref 36.0–46.0)
Hemoglobin: 12.6 g/dL (ref 12.0–15.0)
MCH: 27.6 pg (ref 26.0–34.0)
MCHC: 33 g/dL (ref 30.0–36.0)
MCV: 83.6 fL (ref 78.0–100.0)
PLATELETS: 138 10*3/uL — AB (ref 150–400)
RBC: 4.57 MIL/uL (ref 3.87–5.11)
RDW: 14.5 % (ref 11.5–15.5)
WBC: 8.7 10*3/uL (ref 4.0–10.5)

## 2015-02-16 LAB — COMPREHENSIVE METABOLIC PANEL
ALT: 77 U/L — ABNORMAL HIGH (ref 14–54)
ANION GAP: 12 (ref 5–15)
AST: 84 U/L — AB (ref 15–41)
Albumin: 3.2 g/dL — ABNORMAL LOW (ref 3.5–5.0)
Alkaline Phosphatase: 93 U/L (ref 38–126)
BUN: 17 mg/dL (ref 6–20)
CALCIUM: 8.9 mg/dL (ref 8.9–10.3)
CHLORIDE: 95 mmol/L — AB (ref 101–111)
CO2: 28 mmol/L (ref 22–32)
Creatinine, Ser: 1.35 mg/dL — ABNORMAL HIGH (ref 0.44–1.00)
GFR, EST AFRICAN AMERICAN: 40 mL/min — AB (ref >60)
GFR, EST NON AFRICAN AMERICAN: 34 mL/min — AB (ref >60)
Glucose, Bld: 125 mg/dL — ABNORMAL HIGH (ref 65–99)
POTASSIUM: 3.3 mmol/L — AB (ref 3.5–5.1)
Sodium: 135 mmol/L (ref 135–145)
TOTAL PROTEIN: 6.3 g/dL — AB (ref 6.5–8.1)
Total Bilirubin: 1.4 mg/dL — ABNORMAL HIGH (ref 0.3–1.2)

## 2015-02-16 LAB — VITAMIN B12: Vitamin B-12: 751 pg/mL (ref 180–914)

## 2015-02-16 LAB — AMMONIA

## 2015-02-16 LAB — TSH: TSH: 2.522 u[IU]/mL (ref 0.350–4.500)

## 2015-02-16 LAB — PROTIME-INR
INR: 2.49 — AB (ref 0.00–1.49)
PROTHROMBIN TIME: 26.6 s — AB (ref 11.6–15.2)

## 2015-02-16 LAB — MAGNESIUM: MAGNESIUM: 1.9 mg/dL (ref 1.7–2.4)

## 2015-02-16 MED ORDER — DILTIAZEM HCL ER COATED BEADS 180 MG PO CP24
180.0000 mg | ORAL_CAPSULE | Freq: Every day | ORAL | Status: DC
  Administered 2015-02-16: 180 mg via ORAL
  Filled 2015-02-16: qty 1

## 2015-02-16 MED ORDER — METOPROLOL TARTRATE 50 MG PO TABS
50.0000 mg | ORAL_TABLET | Freq: Two times a day (BID) | ORAL | Status: DC
  Filled 2015-02-16: qty 1

## 2015-02-16 MED ORDER — POTASSIUM CHLORIDE CRYS ER 20 MEQ PO TBCR
40.0000 meq | EXTENDED_RELEASE_TABLET | Freq: Once | ORAL | Status: AC
  Administered 2015-02-16: 40 meq via ORAL
  Filled 2015-02-16: qty 2

## 2015-02-16 MED ORDER — WARFARIN SODIUM 5 MG PO TABS
5.0000 mg | ORAL_TABLET | Freq: Once | ORAL | Status: AC
  Administered 2015-02-16: 5 mg via ORAL
  Filled 2015-02-16: qty 1

## 2015-02-16 MED ORDER — DILTIAZEM HCL ER COATED BEADS 120 MG PO CP24
120.0000 mg | ORAL_CAPSULE | Freq: Every day | ORAL | Status: DC
  Filled 2015-02-16: qty 1

## 2015-02-16 MED ORDER — SODIUM CHLORIDE 0.9 % IV SOLN
INTRAVENOUS | Status: AC
  Administered 2015-02-16: 16:00:00 via INTRAVENOUS
  Filled 2015-02-16: qty 1000

## 2015-02-16 NOTE — Progress Notes (Signed)
ANTICOAGULATION CONSULT NOTE - Follow Up Consult  Pharmacy Consult for Coumadin Indication: atrial fibrillation  Allergies  Allergen Reactions  . Lactose Intolerance (Gi) Other (See Comments)  . Milk-Related Compounds Other (See Comments)    Upset stomach   . Sulfa Antibiotics Other (See Comments)    Reaction many years ago-unknown    Patient Measurements: Weight: 140 lb (63.504 kg)  Vital Signs: Temp: 97.5 F (36.4 C) (11/13 0440) BP: 116/72 mmHg (11/13 0545) Pulse Rate: 71 (11/13 0545)  Labs:  Recent Labs  02/14/15 0303 02/15/15 0620 02/16/15 0420  HGB  --  12.9  --   HCT  --  39.4  --   PLT  --  171  --   LABPROT 18.7* 21.0* 26.6*  INR 1.55* 1.81* 2.49*  CREATININE  --  0.95  --     Estimated Creatinine Clearance: 36.8 mL/min (by C-G formula based on Cr of 0.95).  Assessment: 89yof continues on coumadin for afib. INR is therapeutic after 7.5mg  dosing. (This is higher dose than patient PTA regimen). No bleeding reported.  PTA dose per last anticoag clinic visit 10/27: 5mg  daily except 7.5mg  on Fridays  Goal of Therapy:  INR 2-3 Monitor platelets by anticoagulation protocol: Yes   Plan:  Coumadin 5mg  PO x1 tonight Daily INR Monitor CBC, s/sx bleeding  Melburn Popper, PharmD Clinical Pharmacy Resident Pager: [phone] 02/16/2015 8:22 AM

## 2015-02-16 NOTE — Progress Notes (Signed)
PROGRESS NOTE  Natasha Fletcher [id number] DOB: [birth year] DOA: 02/12/2015 PCP: Myriam Jacobson, MD Brief History 81 y.o. F with a history of hypertension, glaucoma, chronic urinary incontinence with chronic Foley catheter, and chronic atrial fibrillation who was seen in the ED 2 days (02/10/2015) prior to this admission with leakage of her chronic Foley and confusion. Urinalysis suspicious for urinary tract infection (21-50 WBC), Keflex prescribed, Foley catheter changed. Patient was then seen by her Urologist the following day, when her Foley was exchanged for a second time. Patient returned to the ED with persistent confusion. The patient was started on ceftriaxone on 02/12/2015. She was switched to cefuroxime on 02/13/2015. The patient's mental status continued to improve. She remained afebrile and hemodynamically stable. Her diet was advanced which she tolerated. The patient was started on diltiazem to control her atrial fibrillation heart rate. She will be discharged on metoprolol tartrate 100 mg twice a day and diltiazem CD 180 mg daily Assessment/Plan: Sepsis due to urinary tract infection/CAUTI -tachycardia, tachypnea, + UTI at time of admission -urine/blood cultures NGTD--as the patient was on antibiotics prior to admission -CXR nondiagnostic for infective cause  -Initially started on ceftriaxone at the time of admission -Switched to cefuroxime 250 mg twice a day--plan 3 additional days to complete 7 days of therapy -Continue empiric antibiotic therapy -Foley catheter changed on 02/11/2015  Acute Encephalopathy -Most lightly multifactorial to include dementia + acute infection + sundowning  -PT consult --recommended skilled nursing facility -CT brain neg -The patient remained clinically stable on the medical floor and her mental status continued to improve -check TSH, B12, ammonia  Chronic Atrial fibrillation with acute RVR -ChadsVasc score 5 - continue Coumadin  per pharmacy  -Rate presently well controlled -The patient will continue on warfarin after discharge -Please check INR on 02/17/2015 and adjust Coumadin dose accordingly for INR 2-3 -11/12--required brief period of IV diltiazem -Continue metoprolol tartrate 100 mg twice a day -Increased Diltiazem CD 180 mg daily was started  Hypertension.  -BP improved with adding diltiazem -Continue metoprolol tartrate   Chronic diastolic heart failure,  -small right and left pleural effusions not clinically significant--98-99% oxygen saturation on RA -cont home Lasix 20 mg BID  -01/17/2015 echocardiogram--EF 55% to percent -otherwise clinically euvolemic -Continue metoprolol tartrate 100 mg twice a day  Thrombocytopenia -suspect coumadin -monitor for signs of bleeding   Hypokalemia -Replete -Check magnesium Family Communication:   Brother updated at beside--total time 35 min Disposition Plan:   SNF on 11/14 if stable        Procedures/Studies: Dg Chest 2 View  02/12/2015  CLINICAL DATA:  Shortness of breath.  Weakness. EXAM: CHEST  2 VIEW COMPARISON:  11/15/2013 chest radiograph. FINDINGS: There is mild-to-moderate cardiomegaly, increased. Otherwise stable mediastinal silhouette. No pneumothorax. Small right pleural effusion. Trace left pleural effusion. Mild pulmonary edema. IMPRESSION: Mild-to-moderate cardiomegaly, increased, with mild pulmonary edema, most in keeping with mild congestive heart failure. Small right and trace left pleural effusions. Electronically Signed   By: Ilona Sorrel M.D.   On: 02/12/2015 11:07   Ct Head Wo Contrast  02/12/2015  CLINICAL DATA:  Altered mental status. On antibiotics for urinary tract infection. Hypertension and breast cancer. EXAM: CT HEAD WITHOUT CONTRAST TECHNIQUE: Contiguous axial images were obtained from the base of the skull through the vertex without intravenous contrast. COMPARISON:  MRI of 11/12/2013.  CT of 07/11/2013.  FINDINGS: Sinuses/Soft tissues: Subtle gas suspected within the left side of  the face (image 3, series 3). Also within the superior aspect left orbit, including within the superior ophthalmic vein. (image 21, series 3). Both ophthalmic veins are prominent, chronic. No significant soft tissue swelling. Partial opacification of left sphenoid sinus. Hyperostosis frontalis interna. Minimal fluid in right mastoid air cells. Question minimal air in the circle of Willis region. Intracranial: Moderate low density in the periventricular white matter likely related to small vessel disease. No mass lesion, hemorrhage, hydrocephalus, acute infarct, intra-axial, or extra-axial fluid collection. IMPRESSION: 1.  No acute intracranial abnormality. 2. Moderate small vessel ischemic change. 3. Air about the left side of the face. Presuming the patient has had a recent IV, and no recent trauma, this is all likely venous in origin and iatrogenic. If there has been no recent IV, and there is a history of trauma, posttraumatic subcutaneous air would be a concern and face CT suggested. 4.  Sinus disease and minimal right mastoid effusion. Electronically Signed   By: Abigail Miyamoto M.D.   On: 02/12/2015 11:43   Dg Chest Portable 1 View  02/12/2015  CLINICAL DATA:  81 year old female with altered mental status and shortness of breath. Low oxygen saturations. EXAM: PORTABLE CHEST 1 VIEW COMPARISON:  Chest x-ray 02/12/2015. FINDINGS: There is cephalization of the pulmonary vasculature and slight indistinctness of the interstitial markings suggestive of mild pulmonary edema. Small bilateral pleural effusions. Mild cardiomegaly. The patient is rotated to the left on today's exam, resulting in distortion of the mediastinal contours and reduced diagnostic sensitivity and specificity for mediastinal pathology. Atherosclerosis in the thoracic aorta. IMPRESSION: 1. The appearance the chest suggests mild congestive heart failure, as above. 2.  Atherosclerosis. Electronically Signed   By: Vinnie Langton M.D.   On: 02/12/2015 16:08         Subjective: Patient denies fevers, chills, headache, chest pain, dyspnea, nausea, vomiting, diarrhea, abdominal pain, dysuria, hematuria   Objective: Filed Vitals:   02/16/15 0440 02/16/15 0545 02/16/15 0944 02/16/15 1102  BP: 120/68 116/72 119/80   Pulse: 65 71 65   Temp: 97.5 F (36.4 C)     TempSrc:      Resp: 16 13    Weight:    61.1 kg (134 lb 11.2 oz)  SpO2: 98% 99%      Intake/Output Summary (Last 24 hours) at 02/16/15 1247 Last data filed at 02/16/15 1000  Gross per 24 hour  Intake 1263.83 ml  Output    260 ml  Net 1003.83 ml   Weight change:  Exam:   General:  Pt is alert, follows commands appropriately, not in acute distress  HEENT: No icterus, No thrush, No neck mass, [place]/AT  Cardiovascular: IRRR, S1/S2, no rubs, no gallops  Respiratory: Bibasilar crackles, no wheezing  Abdomen: Soft/+BS, non tender, non distended, no guarding  Extremities: No edema, No lymphangitis, No petechiae, No rashes, no synovitis  Data Reviewed: Basic Metabolic Panel:  Recent Labs Lab 02/10/15 1500 02/12/15 0942 02/13/15 0251 02/15/15 0620 02/15/15 2109 02/16/15 0920  NA 139 144 139 136  --  135  K 5.0 4.0 3.5 2.9* 3.2* 3.3*  CL 107 107 108 98*  --  95*  CO2 22 22 22 28   --  28  GLUCOSE 92 141* 96 100*  --  125*  BUN 14 22* 18 9  --  17  CREATININE 0.92 1.14* 0.96 0.95  --  1.35*  CALCIUM 9.5 10.0 8.5* 8.6*  --  8.9  MG  --   --   --   --  1.2* 1.9   Liver Function Tests:  Recent Labs Lab 02/12/15 0942 02/15/15 0620 02/16/15 0920  AST 63* 115* 84*  ALT 52 78* 77*  ALKPHOS 136* 104 93  BILITOT 2.0* 1.9* 1.4*  PROT 7.4 6.1* 6.3*  ALBUMIN 3.8 3.1* 3.2*   No results for input(s): LIPASE, AMYLASE in the last 168 hours. No results for input(s): AMMONIA in the last 168 hours. CBC:  Recent Labs Lab 02/10/15 1500 02/12/15 0942 02/15/15 0620 02/16/15 0920   WBC 4.5 6.5 7.4 8.7  NEUTROABS 2.9  --   --   --   HGB 12.5 14.0 12.9 12.6  HCT 39.2 42.8 39.4 38.2  MCV 86.0 83.4 83.1 83.6  PLT 201 211 171 138*   Cardiac Enzymes: No results for input(s): CKTOTAL, CKMB, CKMBINDEX, TROPONINI in the last 168 hours. BNP: Invalid input(s): POCBNP CBG:  Recent Labs Lab 02/12/15 1420  GLUCAP 126*    Recent Results (from the past 240 hour(s))  Urine culture     Status: None   Collection Time: 02/10/15  2:30 PM  Result Value Ref Range Status   Specimen Description URINE, CATHETERIZED  Final   Special Requests Normal  Final   Culture MULTIPLE SPECIES PRESENT, SUGGEST RECOLLECTION  Final   Report Status 02/11/2015 FINAL  Final  Urine culture     Status: None   Collection Time: 02/12/15  9:30 AM  Result Value Ref Range Status   Specimen Description URINE, CATHETERIZED  Final   Special Requests Normal  Final   Culture NO GROWTH 1 DAY  Final   Report Status 02/13/2015 FINAL  Final  Blood Culture (routine x 2)     Status: None (Preliminary result)   Collection Time: 02/12/15 10:10 AM  Result Value Ref Range Status   Specimen Description BLOOD RIGHT ANTECUBITAL  Final   Special Requests BOTTLES DRAWN AEROBIC AND ANAEROBIC 10CC  Final   Culture NO GROWTH 4 DAYS  Final   Report Status PENDING  Incomplete  Blood Culture (routine x 2)     Status: None (Preliminary result)   Collection Time: 02/12/15 10:20 AM  Result Value Ref Range Status   Specimen Description BLOOD RIGHT ARM  Final   Special Requests BOTTLES DRAWN AEROBIC AND ANAEROBIC 10CC  Final   Culture NO GROWTH 4 DAYS  Final   Report Status PENDING  Incomplete  MRSA PCR Screening     Status: None   Collection Time: 02/12/15  4:50 PM  Result Value Ref Range Status   MRSA by PCR NEGATIVE NEGATIVE Final    Comment:        The GeneXpert MRSA Assay (FDA approved for NASAL specimens only), is one component of a comprehensive MRSA colonization surveillance program. It is not intended to  diagnose MRSA infection nor to guide or monitor treatment for MRSA infections.      Scheduled Meds: . anastrozole  1 mg Oral Daily  . cefUROXime  250 mg Oral BID WC  . diltiazem  180 mg Oral Daily  . feeding supplement  1 Container Oral TID BM  . furosemide  20 mg Oral BID  . latanoprost  1 drop Both Eyes QHS  . metoprolol  100 mg Oral BID  . oxybutynin  5 mg Oral TID  . tamsulosin  0.4 mg Oral Daily  . warfarin  5 mg Oral ONCE-1800  . Warfarin - Pharmacist Dosing Inpatient   Does not apply q1800   Continuous Infusions: . sodium chloride 10 mL/hr at  02/14/15 1600     Milianna Ericsson, DO  [facility] Pager [phone]  If 7PM-7AM, please contact night-coverage [email or web address] Password [email or web address] 02/16/2015, 12:47 PM   LOS: 4 days

## 2015-02-17 ENCOUNTER — Ambulatory Visit: Payer: Medicare Other | Admitting: Pharmacist Clinician (PhC)/ Clinical Pharmacy Specialist

## 2015-02-17 ENCOUNTER — Ambulatory Visit: Payer: Medicare Other | Admitting: Cardiovascular Disease

## 2015-02-17 LAB — BASIC METABOLIC PANEL
ANION GAP: 9 (ref 5–15)
BUN: 15 mg/dL (ref 6–20)
CHLORIDE: 102 mmol/L (ref 101–111)
CO2: 26 mmol/L (ref 22–32)
CREATININE: 0.91 mg/dL (ref 0.44–1.00)
Calcium: 8.9 mg/dL (ref 8.9–10.3)
GFR calc non Af Amer: 55 mL/min — ABNORMAL LOW (ref >60)
Glucose, Bld: 91 mg/dL (ref 65–99)
Potassium: 3.6 mmol/L (ref 3.5–5.1)
SODIUM: 137 mmol/L (ref 135–145)

## 2015-02-17 LAB — CULTURE, BLOOD (ROUTINE X 2)
CULTURE: NO GROWTH
Culture: NO GROWTH

## 2015-02-17 LAB — MAGNESIUM: MAGNESIUM: 1.7 mg/dL (ref 1.7–2.4)

## 2015-02-17 LAB — PROTIME-INR
INR: 2.87 — AB (ref 0.00–1.49)
Prothrombin Time: 29.6 seconds — ABNORMAL HIGH (ref 11.6–15.2)

## 2015-02-17 MED ORDER — METOPROLOL TARTRATE 100 MG PO TABS
100.0000 mg | ORAL_TABLET | Freq: Two times a day (BID) | ORAL | Status: DC
  Administered 2015-02-17: 100 mg via ORAL
  Filled 2015-02-17: qty 1

## 2015-02-17 MED ORDER — CEFUROXIME AXETIL 250 MG PO TABS: 250.0000 mg | ORAL_TABLET | Freq: Two times a day (BID) | ORAL | Status: DC

## 2015-02-17 NOTE — Care Management Note (Signed)
Case Management Note  Patient Details  Name: Natasha Fletcher MRN: [id number] Date of Birth: May 15, 1925  Subjective/Objective:  Pt admitted for tachycardia. Plan for d/c to SNF-[facility].                   Action/Plan: CSW assisting with disposition needs. No needs from CM at this time.    Expected Discharge Date:                  Expected Discharge Plan:  Skilled Nursing Facility  In-House Referral:  Clinical Social Work  Discharge planning Services  CM Consult  Post Acute Care Choice:  NA Choice offered to:  NA  DME Arranged:  N/A DME Agency:  NA  HH Arranged:  NA HH Agency:  NA  Status of Service:  Completed, signed off  Medicare Important Message Given:  Yes Date Medicare IM Given:    Medicare IM give by:    Date Additional Medicare IM Given:    Additional Medicare Important Message give by:     If discussed at [place] of Stay Meetings, dates discussed:    Additional Comments:  Bethena Roys, RN 02/17/2015, 10:23 AM

## 2015-02-17 NOTE — Discharge Summary (Signed)
Physician Discharge Summary  Natasha Fletcher [id number] DOB: Sep 22, 1925 DOA: 02/12/2015  PCP: Myriam Jacobson, MD  Admit date: 02/12/2015 Discharge date: 02/17/2015  Recommendations for Outpatient Follow-up:  1. Pt will need to follow up with PCP in 2 weeks post discharge 2. Please obtain BMP on 02/20/15 3. Please change foley catheter once per month, next on 03/13/15  Discharge Diagnoses:  Sepsis due to urinary tract infection/CAUTI -tachycardia, tachypnea, + UTI at time of admission -urine/blood cultures NGTD--as the patient was on antibiotics prior to admission -CXR nondiagnostic for infective cause  -Initially started on ceftriaxone at the time of admission -Switched to cefuroxime 250 mg twice a day--plan 3 additional days to complete 7 days of therapy -Continue empiric antibiotic therapy -Foley catheter changed on 02/11/2015 -Please change Foley catheter once monthly, next due on 03/13/2015  Acute Encephalopathy -Most lightly multifactorial to include dementia + acute infection + sundowning  -PT consult --recommended skilled nursing facility -CT brain neg -The patient remained clinically stable on the medical floor and her mental status continued to improve although the patient did have intermittent episodes of confusion -check TSH, B12, ammonia--unremarkable  Chronic Atrial fibrillation with acute RVR -ChadsVasc score 5 - continue Coumadin per pharmacy  -Rate presently well controlled -The patient will continue on warfarin after discharge -Please check INR on 02/17/2015 and adjust Coumadin dose accordingly for INR 2-3 -11/12--required brief period of IV diltiazem -Continue metoprolol tartrate 100 mg twice a day -Continue Diltiazem CD 120 mg daily  Hypertension.  -BP improved with adding diltiazem -Continue metoprolol tartrate   Chronic diastolic heart failure,  -small right and left pleural effusions not clinically significant--98-99%  oxygen saturation on RA -cont home Lasix 20 mg BID  -01/17/2015 echocardiogram--EF 55% to percent -otherwise clinically euvolemic -Continue metoprolol tartrate 100 mg twice a day  Thrombocytopenia -suspect coumadin -monitor for signs of bleeding  Hypokalemia -Replete -Check magnesium--1.7 -The patient will be discharged with daily potassium. Please check BMP on 02/20/2015 to determine continued use of potassium  Discharge Condition: stable  Disposition: SNF Follow-up Information    Follow up with [facility] .   Specialty:  Skilled Nursing Facility   Contact information:   [place] [phone]      Diet:heart healthy Wt Readings from Last 3 Encounters:  02/16/15 61.1 kg (134 lb 11.2 oz)  01/17/15 64.864 kg (143 lb)  01/07/15 65.772 kg (145 lb)    History of present illness:  81 y.o. F with a history of hypertension, glaucoma, chronic urinary incontinence with chronic Foley catheter, and chronic atrial fibrillation who was seen in the ED 2 days (02/10/2015) prior to this admission with leakage of her chronic Foley and confusion. Urinalysis suspicious for urinary tract infection (21-50 WBC), Keflex prescribed, Foley catheter changed. Patient was then seen by her Urologist the following day, when her Foley was exchanged for a second time. Patient returned to the ED with persistent confusion. The patient was started on ceftriaxone on 02/12/2015. She was switched to cefuroxime on 02/13/2015. The patient's mental status continued to improve. She remained afebrile and hemodynamically stable. Her diet was advanced which she tolerated. The patient was started on diltiazem to control her atrial fibrillation heart rate. The patient did require a short duration of intravenous diltiazem. She will be discharged on metoprolol tartrate 100 mg twice a day and diltiazem CD 120 mg daily    Discharge Exam: Filed Vitals:   02/17/15 0926  BP:  134/88  Pulse: 82  Temp:   Resp:    Filed Vitals:   02/17/15 0008 02/17/15 0400 02/17/15 0813 02/17/15 0926  BP: 118/93 166/86 160/76 134/88  Pulse: 63 76 81 82  Temp: 98 F (36.7 C) 98.3 F (36.8 C) 98.4 F (36.9 C)   TempSrc: Oral Oral Oral   Resp: 19 18 18    Weight:      SpO2: 97% 100% 100%    General: Awake and alert, NAD, pleasant, cooperative Cardiovascular: IRRR, no rub, no gallop, no S3 Respiratory: CTAB, no wheeze, no rhonchi Abdomen:soft, nontender, nondistended, positive bowel sounds Extremities: No edema, No lymphangitis, no petechiae  Discharge Instructions      Discharge Instructions    Diet - low sodium heart healthy    Complete by:  As directed      Diet - low sodium heart healthy    Complete by:  As directed      Increase activity slowly    Complete by:  As directed      Increase activity slowly    Complete by:  As directed             Medication List    STOP taking these medications        cephALEXin 500 MG capsule  Commonly known as:  KEFLEX      TAKE these medications        anastrozole 1 MG tablet  Commonly known as:  ARIMIDEX  TAKE 1 TABLET (1 MG TOTAL) BY MOUTH DAILY.     cefUROXime 250 MG tablet  Commonly known as:  CEFTIN  Take 1 tablet (250 mg total) by mouth 2 (two) times daily with a meal.     diltiazem 120 MG 24 hr capsule  Commonly known as:  CARDIZEM CD  Take 1 capsule (120 mg total) by mouth daily.     feeding supplement Liqd  Take 1 Container by mouth 3 (three) times daily between meals.     furosemide 20 MG tablet  Commonly known as:  LASIX  Take 20 mg by mouth 2 (two) times daily.     latanoprost 0.005 % ophthalmic solution  Commonly known as:  XALATAN  Place 1 drop into both eyes at bedtime.     metoprolol 100 MG tablet  Commonly known as:  LOPRESSOR  Take 1 tablet (100 mg total) by mouth 2 (two) times daily.     potassium chloride SA 20 MEQ tablet  Commonly known as:  K-DUR,KLOR-CON  Take 1 tablet (20  mEq total) by mouth once. X 5 days     traMADol 50 MG tablet  Commonly known as:  ULTRAM  Take 50 mg by mouth every 6 (six) hours as needed for moderate pain.     warfarin 5 MG tablet  Commonly known as:  COUMADIN  TAKE 1 TABLET (5 MG TOTAL) BY MOUTH DAILY.         The results of significant diagnostics from this hospitalization (including imaging, microbiology, ancillary and laboratory) are listed below for reference.    Significant Diagnostic Studies: Dg Chest 2 View  02/12/2015  CLINICAL DATA:  Shortness of breath.  Weakness. EXAM: CHEST  2 VIEW COMPARISON:  11/15/2013 chest radiograph. FINDINGS: There is mild-to-moderate cardiomegaly, increased. Otherwise stable mediastinal silhouette. No pneumothorax. Small right pleural effusion. Trace left pleural effusion. Mild pulmonary edema. IMPRESSION: Mild-to-moderate cardiomegaly, increased, with mild pulmonary edema, most in keeping with mild congestive heart failure. Small right and trace left pleural effusions. Electronically Signed   By:  Ilona Sorrel M.D.   On: 02/12/2015 11:07   Ct Head Wo Contrast  02/12/2015  CLINICAL DATA:  Altered mental status. On antibiotics for urinary tract infection. Hypertension and breast cancer. EXAM: CT HEAD WITHOUT CONTRAST TECHNIQUE: Contiguous axial images were obtained from the base of the skull through the vertex without intravenous contrast. COMPARISON:  MRI of 11/12/2013.  CT of 07/11/2013. FINDINGS: Sinuses/Soft tissues: Subtle gas suspected within the left side of the face (image 3, series 3). Also within the superior aspect left orbit, including within the superior ophthalmic vein. (image 21, series 3). Both ophthalmic veins are prominent, chronic. No significant soft tissue swelling. Partial opacification of left sphenoid sinus. Hyperostosis frontalis interna. Minimal fluid in right mastoid air cells. Question minimal air in the circle of Willis region. Intracranial: Moderate low density in the  periventricular white matter likely related to small vessel disease. No mass lesion, hemorrhage, hydrocephalus, acute infarct, intra-axial, or extra-axial fluid collection. IMPRESSION: 1.  No acute intracranial abnormality. 2. Moderate small vessel ischemic change. 3. Air about the left side of the face. Presuming the patient has had a recent IV, and no recent trauma, this is all likely venous in origin and iatrogenic. If there has been no recent IV, and there is a history of trauma, posttraumatic subcutaneous air would be a concern and face CT suggested. 4.  Sinus disease and minimal right mastoid effusion. Electronically Signed   By: Abigail Miyamoto M.D.   On: 02/12/2015 11:43   Dg Chest Portable 1 View  02/12/2015  CLINICAL DATA:  81 year old female with altered mental status and shortness of breath. Low oxygen saturations. EXAM: PORTABLE CHEST 1 VIEW COMPARISON:  Chest x-ray 02/12/2015. FINDINGS: There is cephalization of the pulmonary vasculature and slight indistinctness of the interstitial markings suggestive of mild pulmonary edema. Small bilateral pleural effusions. Mild cardiomegaly. The patient is rotated to the left on today's exam, resulting in distortion of the mediastinal contours and reduced diagnostic sensitivity and specificity for mediastinal pathology. Atherosclerosis in the thoracic aorta. IMPRESSION: 1. The appearance the chest suggests mild congestive heart failure, as above. 2. Atherosclerosis. Electronically Signed   By: Vinnie Langton M.D.   On: 02/12/2015 16:08     Microbiology: Recent Results (from the past 240 hour(s))  Urine culture     Status: None   Collection Time: 02/10/15  2:30 PM  Result Value Ref Range Status   Specimen Description URINE, CATHETERIZED  Final   Special Requests Normal  Final   Culture MULTIPLE SPECIES PRESENT, SUGGEST RECOLLECTION  Final   Report Status 02/11/2015 FINAL  Final  Urine culture     Status: None   Collection Time: 02/12/15  9:30 AM    Result Value Ref Range Status   Specimen Description URINE, CATHETERIZED  Final   Special Requests Normal  Final   Culture NO GROWTH 1 DAY  Final   Report Status 02/13/2015 FINAL  Final  Blood Culture (routine x 2)     Status: None (Preliminary result)   Collection Time: 02/12/15 10:10 AM  Result Value Ref Range Status   Specimen Description BLOOD RIGHT ANTECUBITAL  Final   Special Requests BOTTLES DRAWN AEROBIC AND ANAEROBIC 10CC  Final   Culture NO GROWTH 4 DAYS  Final   Report Status PENDING  Incomplete  Blood Culture (routine x 2)     Status: None (Preliminary result)   Collection Time: 02/12/15 10:20 AM  Result Value Ref Range Status   Specimen Description BLOOD RIGHT ARM  Final   Special Requests BOTTLES DRAWN AEROBIC AND ANAEROBIC 10CC  Final   Culture NO GROWTH 4 DAYS  Final   Report Status PENDING  Incomplete  MRSA PCR Screening     Status: None   Collection Time: 02/12/15  4:50 PM  Result Value Ref Range Status   MRSA by PCR NEGATIVE NEGATIVE Final    Comment:        The GeneXpert MRSA Assay (FDA approved for NASAL specimens only), is one component of a comprehensive MRSA colonization surveillance program. It is not intended to diagnose MRSA infection nor to guide or monitor treatment for MRSA infections.      Labs: Basic Metabolic Panel:  Recent Labs Lab 02/12/15 0942 02/13/15 0251 02/15/15 0620 02/15/15 2109 02/16/15 0920 02/17/15 0640  NA 144 139 136  --  135 137  K 4.0 3.5 2.9* 3.2* 3.3* 3.6  CL 107 108 98*  --  95* 102  CO2 22 22 28   --  28 26  GLUCOSE 141* 96 100*  --  125* 91  BUN 22* 18 9  --  17 15  CREATININE 1.14* 0.96 0.95  --  1.35* 0.91  CALCIUM 10.0 8.5* 8.6*  --  8.9 8.9  MG  --   --   --  1.2* 1.9 1.7   Liver Function Tests:  Recent Labs Lab 02/12/15 0942 02/15/15 0620 02/16/15 0920  AST 63* 115* 84*  ALT 52 78* 77*  ALKPHOS 136* 104 93  BILITOT 2.0* 1.9* 1.4*  PROT 7.4 6.1* 6.3*  ALBUMIN 3.8 3.1* 3.2*   No results  for input(s): LIPASE, AMYLASE in the last 168 hours.  Recent Labs Lab 02/16/15 1540  AMMONIA <9*   CBC:  Recent Labs Lab 02/10/15 1500 02/12/15 0942 02/15/15 0620 02/16/15 0920  WBC 4.5 6.5 7.4 8.7  NEUTROABS 2.9  --   --   --   HGB 12.5 14.0 12.9 12.6  HCT 39.2 42.8 39.4 38.2  MCV 86.0 83.4 83.1 83.6  PLT 201 211 171 138*   Cardiac Enzymes: No results for input(s): CKTOTAL, CKMB, CKMBINDEX, TROPONINI in the last 168 hours. BNP: Invalid input(s): POCBNP CBG:  Recent Labs Lab 02/12/15 1420  GLUCAP 126*    Time coordinating discharge:  Greater than 30 minutes  Signed:  Latavious Bitter, DO [facility] Pager: [phone] 02/17/2015, 10:16 AM

## 2015-02-17 NOTE — Progress Notes (Signed)
Physical Therapy Treatment Patient Details Name: Natasha Fletcher MRN: [id number] DOB: Feb 23, 1926 Today's Date: 02/17/2015    History of Present Illness pt presents with UTI and Sepsis.  pt with hx of HTN, Glaucoma, Breast CA, and A-fib.      PT Comments    Pt remains to require maximal assist for all mobility. Pt did tolerate sitting EOB x 5 min but required modA to maintain. Pt remains appropriate for SNF upon d/c.  Follow Up Recommendations  SNF     Equipment Recommendations  None recommended by PT    Recommendations for Other Services       Precautions / Restrictions Precautions Precautions: Fall Restrictions Weight Bearing Restrictions: No    Mobility  Bed Mobility Overal bed mobility: Needs Assistance Bed Mobility: Supine to Sit;Sit to Supine     Supine to sit: Max assist;HOB elevated Sit to supine: Max assist   General bed mobility comments: pt attempted to move LEs off EOB however unable, dependent for trunk elevation. maxAx2 to transfer to [facility] as well  Transfers                 General transfer comment: unable at this time  Ambulation/Gait                 Stairs            Wheelchair Mobility    Modified Rankin (Stroke Patients Only)       Balance Overall balance assessment: Needs assistance Sitting-balance support: Bilateral upper extremity supported;Feet supported Sitting balance-Leahy Scale: Poor Sitting balance - Comments: L lateral lean requiring modA to maintain midline/upright posture                            Cognition Arousal/Alertness: Awake/alert Behavior During Therapy: WFL for tasks assessed/performed Overall Cognitive Status: Impaired/Different from baseline Area of Impairment: Orientation;Awareness Orientation Level: Disoriented to;Situation;Place Current Attention Level: Sustained Memory: Decreased short-term memory Following Commands: Follows one step commands with increased time;Follows  multi-step commands inconsistently Safety/Judgement: Decreased awareness of safety;Decreased awareness of deficits Awareness: Intellectual Problem Solving: Slow processing General Comments: per brother, pt was hallucinating yesterday, recognized her brother today    Exercises General Exercises - Lower Extremity Long Arc Quad: AROM;Both;5 reps;Seated Hip Flexion/Marching: AROM;Both;10 reps;Seated    General Comments        Pertinent Vitals/Pain Pain Assessment: Faces Faces Pain Scale: Hurts little more Pain Location: L hand Pain Descriptors / Indicators: Grimacing Pain Intervention(s): Monitored during session    Home Living Family/patient expects to be discharged to:: Skilled nursing facility Living Arrangements: Other relatives (sister who has dementia and 26 yo brother)                  Prior Function Level of Independence: Needs assistance  Gait / Transfers Assistance Needed: Uses a cane, per brother, pt has a hx of falls and attempts to climb stairs on her own. ADL's / Homemaking Assistance Needed: I with ADLs, but family perform homemaking tasks.       PT Goals (current goals can now be found in the care plan section) Acute Rehab PT Goals Patient Stated Goal: Per brothers for pt to get rehab prior to returning to home.   Progress towards PT goals: Progressing toward goals    Frequency  Min 2X/week    PT Plan Current plan remains appropriate    Co-evaluation  End of Session   Activity Tolerance: Patient tolerated treatment well Patient left: in bed;with call bell/phone within reach;with bed alarm set;with nursing/sitter in room     Time: 1045-1100 PT Time Calculation (min) (ACUTE ONLY): 15 min  Charges:  $Therapeutic Activity: 8-22 mins                    G Codes:      Kingsley Callander 02/17/2015, 11:18 AM   Kittie Plater, PT, DPT Pager #: [phone] Office #: [phone]

## 2015-02-17 NOTE — Evaluation (Signed)
Occupational Therapy Evaluation Patient Details Name: Natasha Fletcher MRN: [id number] DOB: September 04, 1925 Today's Date: 02/17/2015    History of Present Illness pt presents with UTI and Sepsis.  pt with hx of HTN, Glaucoma, Breast CA, and A-fib.     Clinical Impression   Per pt's brother, pt was "stubborn" at home with decreased awareness of safety and deficits, but able to care for herself. She has a hx of falls. Pt presents with lethargy and generalized weakness with poor balance and impaired cognition interfering with ability to perform mobility and ADL.  Recommending SNF for ST rehab.  Brother in agreement, pt unable to participate in d/c planning. Will defer OT to SNF as pt is due to d/c today per brother and chart.    Follow Up Recommendations  SNF;Supervision/Assistance - 24 hour    Equipment Recommendations       Recommendations for Other Services       Precautions / Restrictions Precautions Precautions: Fall Restrictions Weight Bearing Restrictions: No      Mobility Bed Mobility Overal bed mobility: Needs Assistance Bed Mobility: Supine to Sit;Sit to Supine     Supine to sit: Max assist;HOB elevated Sit to supine: Max assist      Transfers                 General transfer comment: unable to transfer safely    Balance     Sitting balance-Leahy Scale: Poor                                      ADL Overall ADL's : Needs assistance/impaired Eating/Feeding: Set up;Bed level Eating/Feeding Details (indicate cue type and reason): per NT Grooming: Wash/dry face;Maximal assistance;Bed level                               Functional mobility during ADLs:  (unable to ambulate) General ADL Comments: Pt requires total assist for bathing, dressing. Incontinent of bowel, has indwelling foley.     Vision  Wears glasses, hx of glaucoma, at baseline   Perception     Praxis      Pertinent Vitals/Pain Pain Assessment:  Faces Faces Pain Scale: Hurts a little bit Pain Location: generalized Pain Descriptors / Indicators: Grimacing Pain Intervention(s): Repositioned;Monitored during session     Hand Dominance Right   Extremity/Trunk Assessment Upper Extremity Assessment Upper Extremity Assessment: Generalized weakness   Lower Extremity Assessment Lower Extremity Assessment: Generalized weakness   Cervical / Trunk Assessment Cervical / Trunk Assessment: Kyphotic   Communication Communication Communication: HOH   Cognition Arousal/Alertness: Lethargic Behavior During Therapy: WFL for tasks assessed/performed Overall Cognitive Status: Impaired/Different from baseline Area of Impairment: Orientation;Attention;Memory;Following commands;Safety/judgement;Awareness;Problem solving Orientation Level: Disoriented to;Situation;Time Current Attention Level: Sustained Memory: Decreased short-term memory Following Commands: Follows one step commands with increased time;Follows multi-step commands inconsistently Safety/Judgement: Decreased awareness of safety;Decreased awareness of deficits Awareness: Intellectual Problem Solving: Slow processing;Decreased initiation;Difficulty sequencing;Requires verbal cues;Requires tactile cues General Comments: per brother, pt was hallucinating yesterday, recognized her brother today   General Comments       Exercises       Shoulder Instructions      Home Living Family/patient expects to be discharged to:: Skilled nursing facility Living Arrangements: Other relatives (sister who has dementia and 15 yo brother)  Prior Functioning/Environment Level of Independence: Needs assistance  Gait / Transfers Assistance Needed: Uses a cane, per brother, pt has a hx of falls and attempts to climb stairs on her own. ADL's / Homemaking Assistance Needed: I with ADLs, but family perform homemaking tasks.          OT  Diagnosis: Generalized weakness;Cognitive deficits   OT Problem List: Decreased strength;Decreased activity tolerance;Impaired balance (sitting and/or standing);Decreased coordination;Decreased cognition;Decreased knowledge of use of DME or AE;Decreased safety awareness;Decreased knowledge of precautions;Pain   OT Treatment/Interventions:      OT Goals(Current goals can be found in the care plan section) Acute Rehab OT Goals Patient Stated Goal: Per brothers for pt to get rehab prior to returning to home.    OT Frequency:     Barriers to D/C:            Co-evaluation              End of Session    Activity Tolerance: Patient limited by fatigue;Patient limited by lethargy Patient left: in bed;with call bell/phone within reach;with bed alarm set   Time: [id number] OT Time Calculation (min): 17 min Charges:  OT General Charges $OT Visit: 1 Procedure OT Evaluation $Initial OT Evaluation Tier I: 1 Procedure G-Codes:    Malka So 02/17/2015, 9:25 AM  [phone]

## 2015-02-17 NOTE — Progress Notes (Addendum)
Patient being discharged to [facility] SNF per MD order. Spoke with brother, as well as CSW did, patient and family members fully understand the plan of care. Patient will transport with [facility]. Due to high call volumne, [facility] arrived at 1757 to transport patient, Family notified.

## 2015-02-17 NOTE — Clinical Social Work Note (Signed)
CSW spoke with Hoyle Sauer from [facility]. Hoyle Sauer confirmed that they can accept the pt today. Hoyle Sauer informed the CSW that the insurance authorization has expired and a new requested is needed. CSW called PT/OT's office to ask them to assess the pt today, so the CSW can send updated clinical to the insurance for a new authorization.    Clinical Social Worker  Rylen Hou, MSW, LCSW [phone]

## 2015-02-17 NOTE — Progress Notes (Signed)
Patient refused to weigh this AM on standing scale.

## 2015-02-17 NOTE — Plan of Care (Signed)
Unable to educate patient due to advanced dementia with cognitive and memory impairment. Recommend plan of care and education involve family/caregiver whenever possible.  Progressing.

## 2015-02-17 NOTE — Care Management Important Message (Signed)
Important Message  Patient Details  Name: Natasha Fletcher MRN: [id number] Date of Birth: [birth year]   Medicare Important Message Given:  Yes    Elam Ellis P Ramone Gander 02/17/2015, 2:50 PM

## 2015-02-17 NOTE — Clinical Social Work Placement (Signed)
   CLINICAL SOCIAL WORK PLACEMENT  NOTE  Date:  02/17/2015  Patient Details  Name: Natasha Fletcher MRN: [id number] Date of Birth: Oct 19, 1925  Clinical Social Work is seeking post-discharge placement for this patient at the [place] level of care (*CSW will initial, date and re-position this form in  chart as items are completed):  Yes   Patient/family provided with [facility] Work Department's list of facilities offering this level of care within the geographic area requested by the patient (or if unable, by the patient's family).  Yes   Patient/family informed of their freedom to choose among providers that offer the needed level of care, that participate in Medicare, Medicaid or managed care program needed by the patient, have an available bed and are willing to accept the patient.  Yes   Patient/family informed of [facility]'s ownership interest in [facility] and [facility], as well as of the fact that they are under no obligation to receive care at these facilities.  PASRR submitted to EDS on       PASRR number received on       Existing PASRR number confirmed on 02/13/15     FL2 transmitted to all facilities in geographic area requested by pt/family on 02/13/15     FL2 transmitted to all facilities within larger geographic area on       Patient informed that his/her managed care company has contracts with or will negotiate with certain facilities, including the following:        Yes   Patient/family informed of bed offers received.  Patient chooses bed at [facility]     Physician recommends and patient chooses bed at      Patient to be transferred to [facility] on 02/17/15.  Patient to be transferred to facility by [facility]      Patient family notified on 02/17/15 of transfer.  Name of family member notified:  Id-Deem,[name], brother      PHYSICIAN       Additional Comment:     _______________________________________________ Greta Doom, LCSW 02/17/2015, 11:54 AM

## 2015-02-18 ENCOUNTER — Non-Acute Institutional Stay (SKILLED_NURSING_FACILITY): Payer: Medicare Other | Admitting: Adult Health

## 2015-02-18 ENCOUNTER — Encounter: Payer: Self-pay | Admitting: Adult Health

## 2015-02-18 DIAGNOSIS — R5381 Other malaise: Secondary | ICD-10-CM | POA: Diagnosis not present

## 2015-02-18 DIAGNOSIS — E876 Hypokalemia: Secondary | ICD-10-CM

## 2015-02-18 DIAGNOSIS — N390 Urinary tract infection, site not specified: Secondary | ICD-10-CM | POA: Diagnosis not present

## 2015-02-18 DIAGNOSIS — E43 Unspecified severe protein-calorie malnutrition: Secondary | ICD-10-CM

## 2015-02-18 DIAGNOSIS — Z7901 Long term (current) use of anticoagulants: Secondary | ICD-10-CM

## 2015-02-18 DIAGNOSIS — I5032 Chronic diastolic (congestive) heart failure: Secondary | ICD-10-CM | POA: Diagnosis not present

## 2015-02-18 DIAGNOSIS — I480 Paroxysmal atrial fibrillation: Secondary | ICD-10-CM

## 2015-02-18 DIAGNOSIS — R339 Retention of urine, unspecified: Secondary | ICD-10-CM | POA: Diagnosis not present

## 2015-02-18 DIAGNOSIS — D696 Thrombocytopenia, unspecified: Secondary | ICD-10-CM

## 2015-02-18 DIAGNOSIS — I10 Essential (primary) hypertension: Secondary | ICD-10-CM

## 2015-02-18 DIAGNOSIS — A419 Sepsis, unspecified organism: Secondary | ICD-10-CM

## 2015-02-18 NOTE — Progress Notes (Signed)
Patient ID: Natasha Fletcher, female   DOB: 10-05-1925, 81 y.o.   MRN: [id number]    DATE:  02/18/2015   MRN:  [id number]  BIRTHDAY: [birth year]  Facility:  Nursing Home Location:  [place] Room Number: 1201-P  LEVEL OF CARE:  SNF (31)  Contact Information    Name Relation Home Work Mobile   [place] Sister [phone]     [name] Brother [phone]         Chief Complaint  Patient presents with  . Hospitalization Follow-up    Physical deconditioning, UTI, atrial fibrillation, chronic diastolic CHF, urinary retention, hypertension, thrombocytopenia, protein calorie malnutrition and hypokalemia    HISTORY OF PRESENT ILLNESS:  This is an 81 year old female who has been admitted to [facility] on 02/17/15 from [facility]. She has PMH of hypertension, glaucoma, chronic urinary retention with chronic Foley catheter and chronic atrial fibrillation. She was treated in the hospital for sepsis due to UTI with ceftriaxone and was switched to cefuroxime upon discharge. Her confusion has improved. She was started on diltiazem to control her heart rate (has atrial fibrillation ).   She has been admitted for a short-term rehabilitation.   PAST MEDICAL HISTORY:  Past Medical History  Diagnosis Date  . Hypertension   . Asthma   . Glaucoma   . Incontinence   . Wears dentures   . Wears glasses   . Thyroid disease     "something years ago"  . GERD (gastroesophageal reflux disease)   . Cancer (HCC)     rt breast  . Edema leg   . Fast breathing     "because of pill"  . Pneumonia     h/o younger  . Arthritis     "all over"  . S/P total knee arthroplasty   . Glaucoma   . Swelling     bilateral feet/ legs, more left.  . Atrial fibrillation ([place])   . Diabetes mellitus without complication ([place])   . UTI (urinary tract infection) 02/12/2015     CURRENT MEDICATIONS: Reviewed  Patient's Medications  New Prescriptions   No  medications on file  Previous Medications   ANASTROZOLE (ARIMIDEX) 1 MG TABLET    TAKE 1 TABLET (1 MG TOTAL) BY MOUTH DAILY.   CEFUROXIME (CEFTIN) 250 MG TABLET    Take 1 tablet (250 mg total) by mouth 2 (two) times daily with a meal.   DILTIAZEM (CARDIZEM CD) 120 MG 24 HR CAPSULE    Take 1 capsule (120 mg total) by mouth daily.   FEEDING SUPPLEMENT (BOOST / RESOURCE BREEZE) LIQD    Take 1 Container by mouth 3 (three) times daily between meals.   FUROSEMIDE (LASIX) 20 MG TABLET    Take 20 mg by mouth 2 (two) times daily.    LATANOPROST (XALATAN) 0.005 % OPHTHALMIC SOLUTION    Place 1 drop into both eyes at bedtime.   METOPROLOL (LOPRESSOR) 100 MG TABLET    Take 1 tablet (100 mg total) by mouth 2 (two) times daily.   POTASSIUM CHLORIDE SA (K-DUR,KLOR-CON) 20 MEQ TABLET    Take 1 tablet (20 mEq total) by mouth once. X 5 days   TRAMADOL (ULTRAM) 50 MG TABLET    Take 50 mg by mouth every 6 (six) hours as needed for moderate pain.   WARFARIN (COUMADIN) 5 MG TABLET    TAKE 1 TABLET (5 MG TOTAL) BY MOUTH DAILY.  Modified Medications   No medications on  file  Discontinued Medications   No medications on file     Allergies  Allergen Reactions  . Lactose Intolerance (Gi) Other (See Comments)  . Milk-Related Compounds Other (See Comments)    Upset stomach   . Sulfa Antibiotics Other (See Comments)    Reaction many years ago-unknown     REVIEW OF SYSTEMS:  GENERAL: no change in appetite, no fatigue, no weight changes, no fever, chills or weakness EYES: Denies change in vision, dry eyes, eye pain, itching or discharge EARS: Denies change in hearing, ringing in ears, or earache NOSE: Denies nasal congestion or epistaxis MOUTH and THROAT: Denies oral discomfort, gingival pain or bleeding, pain from teeth or hoarseness   RESPIRATORY: no cough, SOB, DOE, wheezing, hemoptysis CARDIAC: no chest pain, edema or palpitations GI: no abdominal pain, diarrhea, constipation, heart burn, nausea or  vomiting GU: Denies dysuria, frequency, hematuria, incontinence, or discharge PSYCHIATRIC: Denies feeling of depression or anxiety. No report of hallucinations, insomnia, paranoia, or agitation   PHYSICAL EXAMINATION  GENERAL APPEARANCE: Well nourished. In no acute distress. Normal body habitus HEAD: Normal in size and contour. No evidence of trauma EYES: Lids open and close normally. No blepharitis, entropion or ectropion. PERRL. Conjunctivae are clear and sclerae are white. Lenses are without opacity EARS: Pinnae are normal. Patient hears normal voice tunes of the examiner MOUTH and THROAT: Lips are without lesions. Oral mucosa is moist and without lesions. Tongue is normal in shape, size, and color and without lesions NECK: supple, trachea midline, no neck masses, no thyroid tenderness, no thyromegaly LYMPHATICS: no LAN in the neck, no supraclavicular LAN RESPIRATORY: breathing is even & unlabored, BS CTAB CARDIAC: Irregularly irregular, no murmur,no extra heart sounds, no edema GI: abdomen soft, normal BS, no masses, no tenderness, no hepatomegaly, no splenomegaly GU:  Has foley catheter draining to urine bag EXTREMITIES:  Able to move X 4 extremities PSYCHIATRIC: Alert and oriented X 3. Affect and behavior are appropriate  LABS/RADIOLOGY: Labs reviewed: Basic Metabolic Panel:  Recent Labs  02/15/15 0620 02/15/15 2109 02/16/15 0920 02/17/15 0640  NA 136  --  135 137  K 2.9* 3.2* 3.3* 3.6  CL 98*  --  95* 102  CO2 28  --  28 26  GLUCOSE 100*  --  125* 91  BUN 9  --  17 15  CREATININE 0.95  --  1.35* 0.91  CALCIUM 8.6*  --  8.9 8.9  MG  --  1.2* 1.9 1.7   Liver Function Tests:  Recent Labs  02/12/15 0942 02/15/15 0620 02/16/15 0920  AST 63* 115* 84*  ALT 52 78* 77*  ALKPHOS 136* 104 93  BILITOT 2.0* 1.9* 1.4*  PROT 7.4 6.1* 6.3*  ALBUMIN 3.8 3.1* 3.2*    Recent Labs  08/10/14 1633 09/01/14 1630  LIPASE 21* 16*    Recent Labs  02/16/15 1540  AMMONIA  <9*   CBC:  Recent Labs  08/10/14 1633 09/01/14 1630  02/10/15 1500 02/12/15 0942 02/15/15 0620 02/16/15 0920  WBC 3.9* 5.6  < > 4.5 6.5 7.4 8.7  NEUTROABS 2.6 4.5  --  2.9  --   --   --   HGB 11.7* 11.5*  < > 12.5 14.0 12.9 12.6  HCT 35.9* 35.5*  < > 39.2 42.8 39.4 38.2  MCV 81.8 82.8  < > 86.0 83.4 83.1 83.6  PLT 201 212  < > 201 211 171 138*  < > = values in this interval not displayed.  CBG:  Recent Labs  02/12/15 1420  GLUCAP 126*      Dg Chest 2 View  02/12/2015  CLINICAL DATA:  Shortness of breath.  Weakness. EXAM: CHEST  2 VIEW COMPARISON:  11/15/2013 chest radiograph. FINDINGS: There is mild-to-moderate cardiomegaly, increased. Otherwise stable mediastinal silhouette. No pneumothorax. Small right pleural effusion. Trace left pleural effusion. Mild pulmonary edema. IMPRESSION: Mild-to-moderate cardiomegaly, increased, with mild pulmonary edema, most in keeping with mild congestive heart failure. Small right and trace left pleural effusions. Electronically Signed   By: Ilona Sorrel M.D.   On: 02/12/2015 11:07   Ct Head Wo Contrast  02/12/2015  CLINICAL DATA:  Altered mental status. On antibiotics for urinary tract infection. Hypertension and breast cancer. EXAM: CT HEAD WITHOUT CONTRAST TECHNIQUE: Contiguous axial images were obtained from the base of the skull through the vertex without intravenous contrast. COMPARISON:  MRI of 11/12/2013.  CT of 07/11/2013. FINDINGS: Sinuses/Soft tissues: Subtle gas suspected within the left side of the face (image 3, series 3). Also within the superior aspect left orbit, including within the superior ophthalmic vein. (image 21, series 3). Both ophthalmic veins are prominent, chronic. No significant soft tissue swelling. Partial opacification of left sphenoid sinus. Hyperostosis frontalis interna. Minimal fluid in right mastoid air cells. Question minimal air in the circle of Willis region. Intracranial: Moderate low density in the  periventricular white matter likely related to small vessel disease. No mass lesion, hemorrhage, hydrocephalus, acute infarct, intra-axial, or extra-axial fluid collection. IMPRESSION: 1.  No acute intracranial abnormality. 2. Moderate small vessel ischemic change. 3. Air about the left side of the face. Presuming the patient has had a recent IV, and no recent trauma, this is all likely venous in origin and iatrogenic. If there has been no recent IV, and there is a history of trauma, posttraumatic subcutaneous air would be a concern and face CT suggested. 4.  Sinus disease and minimal right mastoid effusion. Electronically Signed   By: Abigail Miyamoto M.D.   On: 02/12/2015 11:43   Dg Chest Portable 1 View  02/12/2015  CLINICAL DATA:  81 year old female with altered mental status and shortness of breath. Low oxygen saturations. EXAM: PORTABLE CHEST 1 VIEW COMPARISON:  Chest x-ray 02/12/2015. FINDINGS: There is cephalization of the pulmonary vasculature and slight indistinctness of the interstitial markings suggestive of mild pulmonary edema. Small bilateral pleural effusions. Mild cardiomegaly. The patient is rotated to the left on today's exam, resulting in distortion of the mediastinal contours and reduced diagnostic sensitivity and specificity for mediastinal pathology. Atherosclerosis in the thoracic aorta. IMPRESSION: 1. The appearance the chest suggests mild congestive heart failure, as above. 2. Atherosclerosis. Electronically Signed   By: Vinnie Langton M.D.   On: 02/12/2015 16:08    ASSESSMENT/PLAN:  Physical deconditioning - for rehabilitation  Sepsis due to UTI - continue cefuroxime 250 mg twice a day 3 days  Chronic atrial fibrillation - rate controlled; continue diltiazem 120 mg 24 hour 1 capsule by mouth daily, Metoprolol and Coumadin  Chronic diastolic heart failure - continue metoprolol tartrate 100 mg 1 tab by mouth twice a day Lasix 20 mg 1 tab by mouth twice a day; clinically  euvolemic; check BMP on 11/17  Hypertension - well controlled; continue diltiazem, metoprolol and Lasix  Thrombocytopenia - platelet 138; check CBC on 11/17  Protein calorie malnutrition, severe - albumin 3.2; start Procel 1 scoop by mouth twice a day  Hypokalemia - K3.6; continue KCl 20 meq 1 tablet by mouth daily  Long-term use of  anticoagulant - INR 2.8; continue Coumadin 5 mg by mouth daily; INR on 02/21/59  Chronic urinary retention - change Foley catheter once monthly and when necessary     Goals of care:  Short-term rehabilitation     [facility], [place]

## 2015-02-19 ENCOUNTER — Non-Acute Institutional Stay (SKILLED_NURSING_FACILITY): Payer: Medicare Other | Admitting: Internal Medicine

## 2015-02-19 DIAGNOSIS — D696 Thrombocytopenia, unspecified: Secondary | ICD-10-CM | POA: Diagnosis not present

## 2015-02-19 DIAGNOSIS — I5032 Chronic diastolic (congestive) heart failure: Secondary | ICD-10-CM | POA: Diagnosis not present

## 2015-02-19 DIAGNOSIS — R339 Retention of urine, unspecified: Secondary | ICD-10-CM

## 2015-02-19 DIAGNOSIS — I480 Paroxysmal atrial fibrillation: Secondary | ICD-10-CM | POA: Diagnosis not present

## 2015-02-19 DIAGNOSIS — N3000 Acute cystitis without hematuria: Secondary | ICD-10-CM

## 2015-02-19 DIAGNOSIS — R5381 Other malaise: Secondary | ICD-10-CM | POA: Diagnosis not present

## 2015-02-19 DIAGNOSIS — I10 Essential (primary) hypertension: Secondary | ICD-10-CM

## 2015-02-19 DIAGNOSIS — E46 Unspecified protein-calorie malnutrition: Secondary | ICD-10-CM | POA: Diagnosis not present

## 2015-02-19 NOTE — Progress Notes (Signed)
Patient ID: Natasha Fletcher, female   DOB: 02/28/1926, 81 y.o.   MRN: [id number]      [facility]  PCP: ROBERTS, [name], MD  Code Status: full code  Allergies  Allergen Reactions  . Lactose Intolerance (Gi) Other (See Comments)  . Milk-Related Compounds Other (See Comments)    Upset stomach   . Sulfa Antibiotics Other (See Comments)    Reaction many years ago-unknown    Chief Complaint  Patient presents with  . New Admit To SNF     HPI:  81 y.o. patient is here for short term rehabilitation post hospital admission from urosepsis and AMS. She was treated with antibiotics and made improvement. She also had afib and was started in diltiazem. She has PMH of hypertension, glaucoma, chronic urinary retention with chronic Foley catheter and chronic atrial fibrillation. She pulled out her foley last night. Complaints of lower abdominal discomfort this am. She is currently alert and oriented and does not remember removing her foley at night. Her brother is present at bedside  Review of Systems:  Constitutional: Negative for fever, chills, diaphoresis.  HENT: Negative for headache, congestion Eyes: Negative for blurred vision, double vision and discharge.  Respiratory: Negative for cough, shortness of breath and wheezing.   Cardiovascular: Negative for chest pain, palpitations, leg swelling.  Gastrointestinal: Negative for heartburn, nausea, vomiting, abdominal pain Genitourinary: Negative for flank pain.  Musculoskeletal: Negative for falls in the facility Skin: Negative forrash.  Neurological: Negative for dizziness Psychiatric/Behavioral: Negative for depression   Past Medical History  Diagnosis Date  . Hypertension   . Asthma   . Glaucoma   . Incontinence   . Wears dentures   . Wears glasses   . Thyroid disease     "something years ago"  . GERD (gastroesophageal reflux disease)   . Cancer (HCC)     rt breast  . Edema leg   .  Fast breathing     "because of pill"  . Pneumonia     h/o younger  . Arthritis     "all over"  . S/P total knee arthroplasty   . Glaucoma   . Swelling     bilateral feet/ legs, more left.  . Atrial fibrillation ([place])   . Diabetes mellitus without complication ([place])   . UTI (urinary tract infection) 02/12/2015   Past Surgical History  Procedure Laterality Date  . Total hip arthroplasty Bilateral   . Shoulder surgery Left   . Appendectomy    . Carpal tunnel release Bilateral   . Tonsillectomy    . Colonoscopy    . Abdominal hysterectomy    . Eye surgery      both cataracts  . Breast biopsy  04/27/2011    Procedure: BREAST BIOPSY WITH NEEDLE LOCALIZATION;  Surgeon: Edward Jolly, MD;  Location: [facility];  Service: General;  Laterality: Right;  right needle localized breast lumpectomy   . Breast lumpectomy  04/27/11    right breast by [facility]  . Total knee arthroplasty Right 02/19/2013    Procedure: RIGHT TOTAL KNEE ARTHROPLASTY;  Surgeon: Gearlean Alf, MD;  Location: [facility];  Service: Orthopedics;  Laterality: Right;  . Total knee arthroplasty Left 10/15/2013    Procedure: LEFT TOTAL KNEE ARTHROPLASTY;  Surgeon: Gearlean Alf, MD;  Location: [facility];  Service: Orthopedics;  Laterality: Left;  . Cardioversion N/A 02/01/2014    Procedure: CARDIOVERSION;  Surgeon: Candee Furbish, MD;  Location: [facility];  Service: Cardiovascular;  Laterality: N/A;  . Tee without cardioversion N/A 02/01/2014    Procedure: TRANSESOPHAGEAL ECHOCARDIOGRAM (TEE);  Surgeon: Candee Furbish, MD;  Location: [facility];  Service: Cardiovascular;  Laterality: N/A;   Social History:   reports that she quit smoking about 39 years ago. Her smoking use included Cigarettes. She smoked 0.00 packs per day for 30 years. She has never used smokeless tobacco. She reports that she drinks alcohol. She reports that she does not use illicit drugs.  Family History  Problem Relation Age of Onset  .  Heart disease Mother   . Stroke Father   . Heart attack Neg Hx   . Stroke Mother   . Stroke Brother   . Hypertension Mother   . Hypertension Father     Medications:   Medication List       This list is accurate as of: 02/19/15  5:20 PM.  Always use your most recent med list.               anastrozole 1 MG tablet  Commonly known as:  ARIMIDEX  TAKE 1 TABLET (1 MG TOTAL) BY MOUTH DAILY.     cefUROXime 250 MG tablet  Commonly known as:  CEFTIN  Take 1 tablet (250 mg total) by mouth 2 (two) times daily with a meal.     diltiazem 120 MG 24 hr capsule  Commonly known as:  CARDIZEM CD  Take 1 capsule (120 mg total) by mouth daily.     feeding supplement Liqd  Take 1 Container by mouth 3 (three) times daily between meals.     furosemide 20 MG tablet  Commonly known as:  LASIX  Take 20 mg by mouth 2 (two) times daily.     latanoprost 0.005 % ophthalmic solution  Commonly known as:  XALATAN  Place 1 drop into both eyes at bedtime.     metoprolol 100 MG tablet  Commonly known as:  LOPRESSOR  Take 1 tablet (100 mg total) by mouth 2 (two) times daily.     potassium chloride SA 20 MEQ tablet  Commonly known as:  K-DUR,KLOR-CON  Take 1 tablet (20 mEq total) by mouth once. X 5 days     traMADol 50 MG tablet  Commonly known as:  ULTRAM  Take 50 mg by mouth every 6 (six) hours as needed for moderate pain.     warfarin 5 MG tablet  Commonly known as:  COUMADIN  TAKE 1 TABLET (5 MG TOTAL) BY MOUTH DAILY.         Physical Exam: Filed Vitals:   02/19/15 1720  BP: 121/73  Pulse: 78  Temp: 97.3 F (36.3 C)  Resp: 18  Weight: 137 lb 6.4 oz (62.324 kg)  SpO2: 98%    General- elderly female, in no acute distress Head- normocephalic, atraumatic Throat- moist mucus membrane Eyes- PERRLA, EOMI, no pallor, no icterus, no discharge, normal conjunctiva, normal sclera Neck- no cervical lymphadenopathy Cardiovascular- normal s1,s2, no leg edema Respiratory- bilateral  clear to auscultation, no wheeze, no rhonchi, no crackles, no use of accessory muscles Abdomen- bowel sounds present, soft, non tender, no guarding or rigidity Musculoskeletal- able to move all 4 extremities, generalized weakness Neurological-  alert and oriented to person, place and time   Labs reviewed: Basic Metabolic Panel:  Recent Labs  02/15/15 0620 02/15/15 2109 02/16/15 0920 02/17/15 0640  NA 136  --  135 137  K 2.9* 3.2* 3.3* 3.6  CL 98*  --  95*  102  CO2 28  --  28 26  GLUCOSE 100*  --  125* 91  BUN 9  --  17 15  CREATININE 0.95  --  1.35* 0.91  CALCIUM 8.6*  --  8.9 8.9  MG  --  1.2* 1.9 1.7   Liver Function Tests:  Recent Labs  02/12/15 0942 02/15/15 0620 02/16/15 0920  AST 63* 115* 84*  ALT 52 78* 77*  ALKPHOS 136* 104 93  BILITOT 2.0* 1.9* 1.4*  PROT 7.4 6.1* 6.3*  ALBUMIN 3.8 3.1* 3.2*    Recent Labs  08/10/14 1633 09/01/14 1630  LIPASE 21* 16*    Recent Labs  02/16/15 1540  AMMONIA <9*   CBC:  Recent Labs  08/10/14 1633 09/01/14 1630  02/10/15 1500 02/12/15 0942 02/15/15 0620 02/16/15 0920  WBC 3.9* 5.6  < > 4.5 6.5 7.4 8.7  NEUTROABS 2.6 4.5  --  2.9  --   --   --   HGB 11.7* 11.5*  < > 12.5 14.0 12.9 12.6  HCT 35.9* 35.5*  < > 39.2 42.8 39.4 38.2  MCV 81.8 82.8  < > 86.0 83.4 83.1 83.6  PLT 201 212  < > 201 211 171 138*  < > = values in this interval not displayed. Cardiac Enzymes: No results for input(s): CKTOTAL, CKMB, CKMBINDEX, TROPONINI in the last 8760 hours. BNP: Invalid input(s): POCBNP CBG:  Recent Labs  02/12/15 1420  GLUCAP 126*    Radiological Exams: Dg Chest 2 View  02/12/2015  CLINICAL DATA:  Shortness of breath.  Weakness. EXAM: CHEST  2 VIEW COMPARISON:  11/15/2013 chest radiograph. FINDINGS: There is mild-to-moderate cardiomegaly, increased. Otherwise stable mediastinal silhouette. No pneumothorax. Small right pleural effusion. Trace left pleural effusion. Mild pulmonary edema. IMPRESSION:  Mild-to-moderate cardiomegaly, increased, with mild pulmonary edema, most in keeping with mild congestive heart failure. Small right and trace left pleural effusions. Electronically Signed   By: Ilona Sorrel M.D.   On: 02/12/2015 11:07   Ct Head Wo Contrast  02/12/2015  CLINICAL DATA:  Altered mental status. On antibiotics for urinary tract infection. Hypertension and breast cancer. EXAM: CT HEAD WITHOUT CONTRAST TECHNIQUE: Contiguous axial images were obtained from the base of the skull through the vertex without intravenous contrast. COMPARISON:  MRI of 11/12/2013.  CT of 07/11/2013. FINDINGS: Sinuses/Soft tissues: Subtle gas suspected within the left side of the face (image 3, series 3). Also within the superior aspect left orbit, including within the superior ophthalmic vein. (image 21, series 3). Both ophthalmic veins are prominent, chronic. No significant soft tissue swelling. Partial opacification of left sphenoid sinus. Hyperostosis frontalis interna. Minimal fluid in right mastoid air cells. Question minimal air in the circle of Willis region. Intracranial: Moderate low density in the periventricular white matter likely related to small vessel disease. No mass lesion, hemorrhage, hydrocephalus, acute infarct, intra-axial, or extra-axial fluid collection. IMPRESSION: 1.  No acute intracranial abnormality. 2. Moderate small vessel ischemic change. 3. Air about the left side of the face. Presuming the patient has had a recent IV, and no recent trauma, this is all likely venous in origin and iatrogenic. If there has been no recent IV, and there is a history of trauma, posttraumatic subcutaneous air would be a concern and face CT suggested. 4.  Sinus disease and minimal right mastoid effusion. Electronically Signed   By: Abigail Miyamoto M.D.   On: 02/12/2015 11:43   Dg Chest Portable 1 View  02/12/2015  CLINICAL DATA:  81 year old  female with altered mental status and shortness of breath. Low oxygen  saturations. EXAM: PORTABLE CHEST 1 VIEW COMPARISON:  Chest x-ray 02/12/2015. FINDINGS: There is cephalization of the pulmonary vasculature and slight indistinctness of the interstitial markings suggestive of mild pulmonary edema. Small bilateral pleural effusions. Mild cardiomegaly. The patient is rotated to the left on today's exam, resulting in distortion of the mediastinal contours and reduced diagnostic sensitivity and specificity for mediastinal pathology. Atherosclerosis in the thoracic aorta. IMPRESSION: 1. The appearance the chest suggests mild congestive heart failure, as above. 2. Atherosclerosis. Electronically Signed   By: Vinnie Langton M.D.   On: 02/12/2015 16:08      Assessment/Plan  Physical deconditioning Will have her work with physical therapy and occupational therapy team to help with gait training and muscle strengthening exercises.fall precautions. Skin care. Encourage to be out of bed.   UTI Was to complete her cefuroxime yesterday. Sending new u/a today, monitor clinically, maintain hydration and monitor vital signs  Chronic urinary retention Put in a new foley catheter and send u/a with c/s  Chronic atrial fibrillation  Stable, continue diltiazem, Metoprolol and Coumadin, f/u inr  Protein calorie malnutrition Monitor weight and po intake, dietary consult  Thrombocytopenia Monitor cbc, no bleed reported  HTN Stable bp, continue metoprolol and lasix, no changes made, monitor BP  Chronic diastolic heart failure continue metoprolol and Lasix, monitor bmp. Continue kcl supplement   Goals of care: short term rehabilitation   Labs/tests ordered: cbc, cmp 02/20/15  Family/ staff Communication: reviewed care plan with patient, her brother and nursing supervisor    Blanchie Serve, MD  [place] [phone] (Monday-Friday 8 am - 5 pm) [phone] (afterhours)

## 2015-03-18 ENCOUNTER — Encounter (HOSPITAL_COMMUNITY): Payer: Self-pay | Admitting: Emergency Medicine

## 2015-03-18 ENCOUNTER — Observation Stay (HOSPITAL_COMMUNITY)
Admission: EM | Admit: 2015-03-18 | Discharge: 2015-03-19 | Disposition: A | Discharge status: Home / Self Care | Payer: Medicare Other | Attending: Internal Medicine | Admitting: Internal Medicine

## 2015-03-18 ENCOUNTER — Telehealth: Payer: Self-pay | Admitting: Cardiovascular Disease

## 2015-03-18 ENCOUNTER — Emergency Department (HOSPITAL_COMMUNITY): Payer: Medicare Other

## 2015-03-18 DIAGNOSIS — I110 Hypertensive heart disease with heart failure: Secondary | ICD-10-CM | POA: Insufficient documentation

## 2015-03-18 DIAGNOSIS — E079 Disorder of thyroid, unspecified: Secondary | ICD-10-CM | POA: Diagnosis not present

## 2015-03-18 DIAGNOSIS — E860 Dehydration: Secondary | ICD-10-CM | POA: Diagnosis present

## 2015-03-18 DIAGNOSIS — Z96643 Presence of artificial hip joint, bilateral: Secondary | ICD-10-CM | POA: Insufficient documentation

## 2015-03-18 DIAGNOSIS — K219 Gastro-esophageal reflux disease without esophagitis: Secondary | ICD-10-CM | POA: Insufficient documentation

## 2015-03-18 DIAGNOSIS — E440 Moderate protein-calorie malnutrition: Secondary | ICD-10-CM | POA: Diagnosis not present

## 2015-03-18 DIAGNOSIS — I4891 Unspecified atrial fibrillation: Secondary | ICD-10-CM | POA: Diagnosis not present

## 2015-03-18 DIAGNOSIS — I5032 Chronic diastolic (congestive) heart failure: Secondary | ICD-10-CM | POA: Diagnosis not present

## 2015-03-18 DIAGNOSIS — Z79899 Other long term (current) drug therapy: Secondary | ICD-10-CM | POA: Diagnosis not present

## 2015-03-18 DIAGNOSIS — Z87891 Personal history of nicotine dependence: Secondary | ICD-10-CM | POA: Insufficient documentation

## 2015-03-18 DIAGNOSIS — M1990 Unspecified osteoarthritis, unspecified site: Secondary | ICD-10-CM | POA: Diagnosis not present

## 2015-03-18 DIAGNOSIS — Z7901 Long term (current) use of anticoagulants: Secondary | ICD-10-CM | POA: Diagnosis not present

## 2015-03-18 DIAGNOSIS — T50905A Adverse effect of unspecified drugs, medicaments and biological substances, initial encounter: Secondary | ICD-10-CM | POA: Diagnosis present

## 2015-03-18 DIAGNOSIS — E119 Type 2 diabetes mellitus without complications: Secondary | ICD-10-CM | POA: Insufficient documentation

## 2015-03-18 DIAGNOSIS — Z96653 Presence of artificial knee joint, bilateral: Secondary | ICD-10-CM | POA: Insufficient documentation

## 2015-03-18 DIAGNOSIS — H409 Unspecified glaucoma: Secondary | ICD-10-CM | POA: Insufficient documentation

## 2015-03-18 DIAGNOSIS — Z792 Long term (current) use of antibiotics: Secondary | ICD-10-CM | POA: Diagnosis not present

## 2015-03-18 DIAGNOSIS — Z853 Personal history of malignant neoplasm of breast: Secondary | ICD-10-CM | POA: Diagnosis not present

## 2015-03-18 DIAGNOSIS — I4892 Unspecified atrial flutter: Secondary | ICD-10-CM | POA: Diagnosis not present

## 2015-03-18 DIAGNOSIS — I10 Essential (primary) hypertension: Secondary | ICD-10-CM | POA: Insufficient documentation

## 2015-03-18 DIAGNOSIS — R001 Bradycardia, unspecified: Principal | ICD-10-CM | POA: Diagnosis present

## 2015-03-18 DIAGNOSIS — J45909 Unspecified asthma, uncomplicated: Secondary | ICD-10-CM | POA: Insufficient documentation

## 2015-03-18 LAB — BASIC METABOLIC PANEL WITH GFR
Anion gap: 8 (ref 5–15)
BUN: 27 mg/dL — ABNORMAL HIGH (ref 6–20)
CO2: 25 mmol/L (ref 22–32)
Calcium: 9.8 mg/dL (ref 8.9–10.3)
Chloride: 106 mmol/L (ref 101–111)
Creatinine, Ser: 1.31 mg/dL — ABNORMAL HIGH (ref 0.44–1.00)
GFR calc Af Amer: 41 mL/min — ABNORMAL LOW
GFR calc non Af Amer: 35 mL/min — ABNORMAL LOW
Glucose, Bld: 142 mg/dL — ABNORMAL HIGH (ref 65–99)
Potassium: 4.3 mmol/L (ref 3.5–5.1)
Sodium: 139 mmol/L (ref 135–145)

## 2015-03-18 LAB — CBC
HCT: 40.1 % (ref 36.0–46.0)
Hemoglobin: 12.5 g/dL (ref 12.0–15.0)
MCH: 26.8 pg (ref 26.0–34.0)
MCHC: 31.2 g/dL (ref 30.0–36.0)
MCV: 85.9 fL (ref 78.0–100.0)
PLATELETS: 280 10*3/uL (ref 150–400)
RBC: 4.67 MIL/uL (ref 3.87–5.11)
RDW: 15 % (ref 11.5–15.5)
WBC: 5.5 10*3/uL (ref 4.0–10.5)

## 2015-03-18 LAB — URINALYSIS, ROUTINE W REFLEX MICROSCOPIC
BILIRUBIN URINE: NEGATIVE
Glucose, UA: NEGATIVE mg/dL
KETONES UR: NEGATIVE mg/dL
NITRITE: POSITIVE — AB
PH: 6 (ref 5.0–8.0)
Protein, ur: 30 mg/dL — AB
SPECIFIC GRAVITY, URINE: 1.015 (ref 1.005–1.030)

## 2015-03-18 LAB — PROTIME-INR
INR: 2.05 — ABNORMAL HIGH (ref 0.00–1.49)
Prothrombin Time: 23 seconds — ABNORMAL HIGH (ref 11.6–15.2)

## 2015-03-18 LAB — URINE MICROSCOPIC-ADD ON

## 2015-03-18 LAB — I-STAT TROPONIN, ED: Troponin i, poc: 0.05 ng/mL (ref 0.00–0.08)

## 2015-03-18 MED ORDER — ZOLPIDEM TARTRATE 5 MG PO TABS: 5.0000 mg | ORAL_TABLET | Freq: Once | ORAL | Status: DC

## 2015-03-18 MED ORDER — TRAMADOL HCL 50 MG PO TABS
50.0000 mg | ORAL_TABLET | Freq: Four times a day (QID) | ORAL | Status: DC | PRN
  Administered 2015-03-19 (×2): 50 mg via ORAL
  Filled 2015-03-18 (×2): qty 1

## 2015-03-18 MED ORDER — WARFARIN - PHARMACIST DOSING INPATIENT: Freq: Every day | Status: DC

## 2015-03-18 MED ORDER — DILTIAZEM HCL ER COATED BEADS 120 MG PO CP24
120.0000 mg | ORAL_CAPSULE | Freq: Every day | ORAL | Status: DC
  Administered 2015-03-19: 120 mg via ORAL
  Filled 2015-03-18: qty 1

## 2015-03-18 MED ORDER — ANASTROZOLE 1 MG PO TABS
1.0000 mg | ORAL_TABLET | Freq: Every day | ORAL | Status: DC
  Administered 2015-03-19: 1 mg via ORAL
  Filled 2015-03-18: qty 1

## 2015-03-18 MED ORDER — BOOST / RESOURCE BREEZE PO LIQD
1.0000 | Freq: Three times a day (TID) | ORAL | Status: DC
  Administered 2015-03-18 – 2015-03-19 (×2): 1 via ORAL
  Filled 2015-03-18: qty 1

## 2015-03-18 MED ORDER — METOPROLOL TARTRATE 50 MG PO TABS
100.0000 mg | ORAL_TABLET | Freq: Two times a day (BID) | ORAL | Status: DC
  Administered 2015-03-19: 100 mg via ORAL
  Filled 2015-03-18: qty 2

## 2015-03-18 MED ORDER — FUROSEMIDE 20 MG PO TABS
20.0000 mg | ORAL_TABLET | Freq: Two times a day (BID) | ORAL | Status: DC
  Administered 2015-03-19 (×2): 20 mg via ORAL
  Filled 2015-03-18 (×2): qty 1

## 2015-03-18 MED ORDER — LATANOPROST 0.005 % OP SOLN
1.0000 [drp] | Freq: Every day | OPHTHALMIC | Status: DC
  Administered 2015-03-18: 1 [drp] via OPHTHALMIC
  Filled 2015-03-18: qty 2.5

## 2015-03-18 MED ORDER — POTASSIUM CHLORIDE CRYS ER 20 MEQ PO TBCR
20.0000 meq | EXTENDED_RELEASE_TABLET | Freq: Every day | ORAL | Status: DC
  Administered 2015-03-19: 20 meq via ORAL
  Filled 2015-03-18: qty 1

## 2015-03-18 MED ORDER — MECLIZINE HCL 12.5 MG PO TABS
12.5000 mg | ORAL_TABLET | Freq: Two times a day (BID) | ORAL | Status: DC
  Administered 2015-03-18 – 2015-03-19 (×2): 12.5 mg via ORAL
  Filled 2015-03-18 (×2): qty 1

## 2015-03-18 NOTE — H&P (Signed)
[facility] History and Physical  Kataya Amundson Plessinger [id number] DOB: 11-26-1925 DOA: 03/18/2015  Referring physician: EDP PCP: Myriam Jacobson, MD   Chief Complaint: Hypotension   HPI: Natasha Fletcher is a 81 y.o. female with h/o A.flutter, rate controlled on 100mg  metoprolol BID.  Patient was apparently recently hospitalized for sepsis, started on Cardizem 120mg  24hr release daily as well due to tachycardia.  This was continued at the rehab facility.  But seems to have somehow been accidentally switched to Cardizem regular 120mg  once daily when she went home from rehab (as taken per pill bottle that she has in her purse).  She took this today, along with her regular 100mg  metoprolol that she takes BID.  When home health nurse came to visit today her BP was 70/50, and she was bradycardic in the 30s.  She was taken to the ED, thankfully since arrival without intervention her cardizem appears to have worn off and her HR is now 60s A.Flutter, with a BP of 150/80.   Review of Systems: Systems reviewed.  As above, otherwise negative  Past Medical History  Diagnosis Date  . Hypertension   . Asthma   . Glaucoma   . Incontinence   . Wears dentures   . Wears glasses   . Thyroid disease     "something years ago"  . GERD (gastroesophageal reflux disease)   . Cancer (HCC)     rt breast  . Edema leg   . Fast breathing     "because of pill"  . Pneumonia     h/o younger  . Arthritis     "all over"  . S/P total knee arthroplasty   . Glaucoma   . Swelling     bilateral feet/ legs, more left.  . Atrial fibrillation ([place])   . Diabetes mellitus without complication ([place])   . UTI (urinary tract infection) 02/12/2015   Past Surgical History  Procedure Laterality Date  . Total hip arthroplasty Bilateral   . Shoulder surgery Left   . Appendectomy    . Carpal tunnel release Bilateral   . Tonsillectomy    . Colonoscopy    . Abdominal hysterectomy    . Eye surgery      both  cataracts  . Breast biopsy  04/27/2011    Procedure: BREAST BIOPSY WITH NEEDLE LOCALIZATION;  Surgeon: Edward Jolly, MD;  Location: [facility];  Service: General;  Laterality: Right;  right needle localized breast lumpectomy   . Breast lumpectomy  04/27/11    right breast by [facility]  . Total knee arthroplasty Right 02/19/2013    Procedure: RIGHT TOTAL KNEE ARTHROPLASTY;  Surgeon: Gearlean Alf, MD;  Location: [facility];  Service: Orthopedics;  Laterality: Right;  . Total knee arthroplasty Left 10/15/2013    Procedure: LEFT TOTAL KNEE ARTHROPLASTY;  Surgeon: Gearlean Alf, MD;  Location: [facility];  Service: Orthopedics;  Laterality: Left;  . Cardioversion N/A 02/01/2014    Procedure: CARDIOVERSION;  Surgeon: Candee Furbish, MD;  [facility];  Service: Cardiovascular;  Laterality: N/A;  . Tee without cardioversion N/A 02/01/2014    Procedure: TRANSESOPHAGEAL ECHOCARDIOGRAM (TEE);  Surgeon: Candee Furbish, MD;  Location: [facility] ENDOSCOPY;  Service: Cardiovascular;  Laterality: N/A;   Social History:  reports that she quit smoking about 39 years ago. Her smoking use included Cigarettes. She smoked 0.00 packs per day for 30 years. She has never used smokeless tobacco. She reports that she drinks alcohol. She reports that she  does not use illicit drugs.  Allergies  Allergen Reactions  . Lactose Intolerance (Gi) Other (See Comments)  . Milk-Related Compounds Other (See Comments)    Upset stomach   . Sulfa Antibiotics Other (See Comments)    Reaction many years ago-unknown    Family History  Problem Relation Age of Onset  . Heart disease Mother   . Stroke Father   . Heart attack Neg Hx   . Stroke Mother   . Stroke Brother   . Hypertension Mother   . Hypertension Father      Prior to Admission medications   Medication Sig Start Date End Date Taking? Authorizing Provider  anastrozole (ARIMIDEX) 1 MG tablet TAKE 1 TABLET (1 MG TOTAL) BY MOUTH DAILY. 12/06/14  Yes Nicholas Lose, MD  cephALEXin (KEFLEX) 500 MG capsule Take 500 mg by mouth 2 (two) times daily. 02/10/15  Yes Historical Provider, MD  furosemide (LASIX) 20 MG tablet Take 20 mg by mouth 2 (two) times daily.    Yes Historical Provider, MD  latanoprost (XALATAN) 0.005 % ophthalmic solution Place 1 drop into both eyes at bedtime.   Yes Historical Provider, MD  meclizine (ANTIVERT) 25 MG tablet Take 12.5 mg by mouth 2 (two) times daily.   Yes Historical Provider, MD  Menthol, Topical Analgesic, (ICE BLUE EX) Apply 1 application topically 2 (two) times daily as needed (joint pain).   Yes Historical Provider, MD  metoprolol (LOPRESSOR) 100 MG tablet Take 1 tablet (100 mg total) by mouth 2 (two) times daily. 01/07/15  Yes Troy Sine, MD  potassium chloride SA (K-DUR,KLOR-CON) 20 MEQ tablet Take 1 tablet (20 mEq total) by mouth once. X 5 days Patient taking differently: Take 20 mEq by mouth daily.  02/16/15  Yes Orson Eva, MD  traMADol (ULTRAM) 50 MG tablet Take 50 mg by mouth every 6 (six) hours as needed for moderate pain.   Yes Historical Provider, MD  warfarin (COUMADIN) 5 MG tablet TAKE 1 TABLET (5 MG TOTAL) BY MOUTH DAILY. 12/05/14  Yes Troy Sine, MD  feeding supplement (BOOST / RESOURCE BREEZE) LIQD Take 1 Container by mouth 3 (three) times daily between meals. Patient not taking: Reported on 03/18/2015 02/15/15   Orson Eva, MD   Physical Exam: Filed Vitals:   03/18/15 1930 03/18/15 2040  BP: 139/67 156/83  Pulse: 48 57  Resp: 21 20    BP 156/83 mmHg  Pulse 57  Temp(Src)   Resp 20  SpO2 100%  General Appearance:    Alert, oriented, no distress, appears stated age  Head:    Normocephalic, atraumatic  Eyes:    PERRL, EOMI, sclera non-icteric        Nose:   Nares without drainage or epistaxis. Mucosa, turbinates normal  Throat:   Moist mucous membranes. Oropharynx without erythema or exudate.  Neck:   Supple. No carotid bruits.  No thyromegaly.  No lymphadenopathy.   Back:     No CVA  tenderness, no spinal tenderness  Lungs:     Clear to auscultation bilaterally, without wheezes, rhonchi or rales  Chest wall:    No tenderness to palpitation  Heart:    Regular rate and rhythm without murmurs, gallops, rubs  Abdomen:     Soft, non-tender, nondistended, normal bowel sounds, no organomegaly  Genitalia:    deferred  Rectal:    deferred  Extremities:   No clubbing, cyanosis or edema.  Pulses:   2+ and symmetric all extremities  Skin:  Skin color, texture, turgor normal, no rashes or lesions  Lymph nodes:   Cervical, supraclavicular, and axillary nodes normal  Neurologic:   CNII-XII intact. Normal strength, sensation and reflexes      throughout    Labs on Admission:  Basic Metabolic Panel:  Recent Labs Lab 03/18/15 1653  NA 139  K 4.3  CL 106  CO2 25  GLUCOSE 142*  BUN 27*  CREATININE 1.31*  CALCIUM 9.8   Liver Function Tests: No results for input(s): AST, ALT, ALKPHOS, BILITOT, PROT, ALBUMIN in the last 168 hours. No results for input(s): LIPASE, AMYLASE in the last 168 hours. No results for input(s): AMMONIA in the last 168 hours. CBC:  Recent Labs Lab 03/18/15 1653  WBC 5.5  HGB 12.5  HCT 40.1  MCV 85.9  PLT 280   Cardiac Enzymes: No results for input(s): CKTOTAL, CKMB, CKMBINDEX, TROPONINI in the last 168 hours.  BNP (last 3 results) No results for input(s): PROBNP in the last 8760 hours. CBG: No results for input(s): GLUCAP in the last 168 hours.  Radiological Exams on Admission: Dg Chest 2 View  03/18/2015  CLINICAL DATA:  Decreased p.o. intake. Hypotension and bradycardia. Recent hospitalization in November for urinary tract infection. Dehydration. EXAM: CHEST  2 VIEW COMPARISON:  02/12/2015 FINDINGS: Cardiac enlargement with mild pulmonary vascular congestion. Congestive changes are decreased since the previous study. No significant edema. Atelectasis in the lung bases. No blunting of costophrenic angles. No pneumothorax. Degenerative  changes in the spine and shoulders. Loss of subacromial space bilaterally suggesting bilateral rotator cuff arthropathies. Calcification of the aorta. Mediastinal contours appear intact. Degenerative changes in the spine. IMPRESSION: Cardiac enlargement with mild pulmonary vascular congestion, improving since prior study. Atelectasis in the lung bases. Electronically Signed   By: Lucienne Capers M.D.   On: 03/18/2015 18:14    EKG: Independently reviewed.  Assessment/Plan Principal Problem:   Bradycardia, drug induced Active Problems:   Atrial flutter (HCC)   Chronic diastolic CHF (congestive heart failure) (HCC)   Drug induced bradycardia - Believe that this specifically is probably due to taking cardizem 120mg  (regular) that is in her medication bag that she has with her, in stead of the 120mg  24hr release that she had been taking, and doing okay with, in hospital, and at rehab facility after discharge.  Thankfully med has already worn off  Will Hold night time meds for now  Resume Metoprolol 100mg  BID tomorrow AM and Cardizem 120mg  24hr tab tomorrow AM.  Tele monitor A.Flutter - Rate controlled, continue coumadin     Code Status: Full  Family Communication: No family in room Disposition Plan: Admit to obs   Time spent: 70 min  GARDNER, JARED M. [facility] Pager [phone]  If 7AM-7PM, please contact the day team taking care of the patient [email or web address] Password [facility] 03/18/2015, 8:53 PM

## 2015-03-18 NOTE — Progress Notes (Signed)
ANTICOAGULATION CONSULT NOTE - Initial Consult  Pharmacy Consult for Warfarin Indication: atrial fibrillation  Allergies  Allergen Reactions  . Lactose Intolerance (Gi) Other (See Comments)  . Milk-Related Compounds Other (See Comments)    Upset stomach   . Sulfa Antibiotics Other (See Comments)    Reaction many years ago-unknown    Patient Measurements:    Vital Signs: BP: 156/83 mmHg (12/13 2040) Pulse Rate: 57 (12/13 2040)  Labs:  Recent Labs  03/18/15 1653 03/18/15 1700  HGB 12.5  --   HCT 40.1  --   PLT 280  --   LABPROT  --  23.0*  INR  --  2.05*  CREATININE 1.31*  --     CrCl cannot be calculated (Unknown ideal weight.).   Medical History: Past Medical History  Diagnosis Date  . Hypertension   . Asthma   . Glaucoma   . Incontinence   . Wears dentures   . Wears glasses   . Thyroid disease     "something years ago"  . GERD (gastroesophageal reflux disease)   . Cancer (HCC)     rt breast  . Edema leg   . Fast breathing     "because of pill"  . Pneumonia     h/o younger  . Arthritis     "all over"  . S/P total knee arthroplasty   . Glaucoma   . Swelling     bilateral feet/ legs, more left.  . Atrial fibrillation ([place])   . Diabetes mellitus without complication ([place])   . UTI (urinary tract infection) 02/12/2015    Medications:  Scheduled:  . [START ON 03/19/2015] anastrozole  1 mg Oral Daily  . feeding supplement  1 Container Oral TID BM  . [START ON 03/19/2015] furosemide  20 mg Oral BID  . latanoprost  1 drop Both Eyes QHS  . meclizine  12.5 mg Oral BID  . [START ON 03/19/2015] metoprolol  100 mg Oral BID  . [START ON 03/19/2015] potassium chloride SA  20 mEq Oral Daily  . [START ON 03/19/2015] Warfarin - Pharmacist Dosing Inpatient   Does not apply q1800   Infusions:    Assessment:  81 yr old female with h/o AFib and on Warfarin 5mg  daily PTA.  Last reported dose taken 12/13 @ 11:30  INR upon admission today = 2.05  (therapeutic)  Patient in ED for decreased PO intake  Goal of Therapy:  INR 2-3   Plan:   No further warfarin needed this AM (pt already taken dose today and INR therapeutic)  Check daily INR  Natasha Fletcher, Toribio Harbour, PharmD 03/18/2015,8:55 PM

## 2015-03-18 NOTE — Telephone Encounter (Signed)
Carollee Massed called in following up with the pt at home and she wanted to report that the pt's  BP was down to 60/40 with a pulse of 42. Vickie is not sure as to what may be causing the drop in BP with her still taking Metoprolol and Diltiazem. Vickie wanted the nurse to follow up with the pt about possibly adjusting her meds if need be.  Thanks

## 2015-03-18 NOTE — Telephone Encounter (Addendum)
Informed Carollee Massed the nurse at patient's home to take another BP reading and if it is still low patient should go to ED or call 911. Vickie verbalized understanding. Will send message to Dr. Claiborne Billings so he is aware.

## 2015-03-18 NOTE — ED Provider Notes (Addendum)
CSN: [id number]     Arrival date & time 03/18/15  1552 History   First MD Initiated Contact with Patient 03/18/15 1719     Chief Complaint  Patient presents with  . Dehydration  . Bradycardia      HPI  Pt reports decreased PO intake. BP was taken by home health nurse and found to be 70/50. Pt is A&Ox4. Bradycardic in triage. Pt was recently hospitalized in November for UTI and HR was in 130s. Pt says she is dehydrated. No other c/c. Hx a-fib. Recently started on new BP medications. Has indwelling catheter. Past Medical History  Diagnosis Date  . Hypertension   . Asthma   . Glaucoma   . Incontinence   . Wears dentures   . Wears glasses   . Thyroid disease     "something years ago"  . GERD (gastroesophageal reflux disease)   . Cancer (HCC)     rt breast  . Edema leg   . Fast breathing     "because of pill"  . Pneumonia     h/o younger  . Arthritis     "all over"  . S/P total knee arthroplasty   . Glaucoma   . Swelling     bilateral feet/ legs, more left.  . Atrial fibrillation ([place])   . Diabetes mellitus without complication ([place])   . UTI (urinary tract infection) 02/12/2015   Past Surgical History  Procedure Laterality Date  . Total hip arthroplasty Bilateral   . Shoulder surgery Left   . Appendectomy    . Carpal tunnel release Bilateral   . Tonsillectomy    . Colonoscopy    . Abdominal hysterectomy    . Eye surgery      both cataracts  . Breast biopsy  04/27/2011    Procedure: BREAST BIOPSY WITH NEEDLE LOCALIZATION;  Surgeon: Edward Jolly, MD;  Location: [facility];  Service: General;  Laterality: Right;  right needle localized breast lumpectomy   . Breast lumpectomy  04/27/11    right breast by [facility]  . Total knee arthroplasty Right 02/19/2013    Procedure: RIGHT TOTAL KNEE ARTHROPLASTY;  Surgeon: Gearlean Alf, MD;  Location: [facility];  Service: Orthopedics;  Laterality: Right;  . Total knee arthroplasty Left 10/15/2013    Procedure:  LEFT TOTAL KNEE ARTHROPLASTY;  Surgeon: Gearlean Alf, MD;  Location: [facility];  Service: Orthopedics;  Laterality: Left;  . Cardioversion N/A 02/01/2014    Procedure: CARDIOVERSION;  Surgeon: Candee Furbish, MD;  Location[facility];  Service: Cardiovascular;  Laterality: N/A;  . Tee without cardioversion N/A 02/01/2014    Procedure: TRANSESOPHAGEAL ECHOCARDIOGRAM (TEE);  Surgeon: Candee Furbish, MD;  Location: [facility] ENDOSCOPY;  Service: Cardiovascular;  Laterality: N/A;   Family History  Problem Relation Age of Onset  . Heart disease Mother   . Stroke Father   . Heart attack Neg Hx   . Stroke Mother   . Stroke Brother   . Hypertension Mother   . Hypertension Father    Social History  Substance Use Topics  . Smoking status: Former Smoker -- 0.00 packs/day for 30 years    Types: Cigarettes    Quit date: 04/21/1975  . Smokeless tobacco: Never Used  . Alcohol Use: Yes     Comment: occasionally   OB History    No data available     Review of Systems  Denies chest pain.  Denies back pain. All other systems reviewed and are negative  Allergies  Lactose intolerance (gi); Milk-related compounds; and Sulfa antibiotics  Home Medications   Prior to Admission medications   Medication Sig Start Date End Date Taking? Authorizing Provider  anastrozole (ARIMIDEX) 1 MG tablet TAKE 1 TABLET (1 MG TOTAL) BY MOUTH DAILY. 12/06/14  Yes Nicholas Lose, MD  cephALEXin (KEFLEX) 500 MG capsule Take 500 mg by mouth 2 (two) times daily. 02/10/15  Yes Historical Provider, MD  furosemide (LASIX) 20 MG tablet Take 20 mg by mouth 2 (two) times daily.    Yes Historical Provider, MD  latanoprost (XALATAN) 0.005 % ophthalmic solution Place 1 drop into both eyes at bedtime.   Yes Historical Provider, MD  meclizine (ANTIVERT) 25 MG tablet Take 12.5 mg by mouth 2 (two) times daily.   Yes Historical Provider, MD  Menthol, Topical Analgesic, (ICE BLUE EX) Apply 1 application topically 2 (two) times daily as needed  (joint pain).   Yes Historical Provider, MD  metoprolol (LOPRESSOR) 100 MG tablet Take 1 tablet (100 mg total) by mouth 2 (two) times daily. 01/07/15  Yes Troy Sine, MD  potassium chloride SA (K-DUR,KLOR-CON) 20 MEQ tablet Take 1 tablet (20 mEq total) by mouth once. X 5 days Patient taking differently: Take 20 mEq by mouth daily.  02/16/15  Yes Orson Eva, MD  traMADol (ULTRAM) 50 MG tablet Take 50 mg by mouth every 6 (six) hours as needed for moderate pain.   Yes Historical Provider, MD  warfarin (COUMADIN) 5 MG tablet TAKE 1 TABLET (5 MG TOTAL) BY MOUTH DAILY. 12/05/14  Yes Troy Sine, MD  feeding supplement (BOOST / RESOURCE BREEZE) LIQD Take 1 Container by mouth 3 (three) times daily between meals. Patient not taking: Reported on 03/18/2015 02/15/15   Orson Eva, MD   BP 110/55 mmHg  Pulse 69  Temp(Src) 97.9 F (36.6 C) (Oral)  Resp 20  Ht 5\' 4"  (1.626 m)  Wt 138 lb 0.1 oz (62.6 kg)  BMI 23.68 kg/m2  SpO2 100% Physical Exam Physical Exam  Nursing note and vitals reviewed. Constitutional: She is oriented to person, place, and time. She appears well-developed and well-nourished. No distress.  HENT: Mucous membranes appear dry. Head: Normocephalic and atraumatic.  Eyes: Pupils are equal, round, and reactive to light.  Neck: Normal range of motion.  Cardiovascular: Patient bradycardic with an irregularly irregular rhythm and intact distal pulses.   Pulmonary/Chest: No respiratory distress.  Abdominal: Normal appearance. She exhibits no distension.  Musculoskeletal: Normal range of motion.  Neurological: She is alert and oriented to person, place, and time. No cranial nerve deficit.  Skin: Skin is warm and dry. No rash noted.  Psychiatric: She has a normal mood and affect. Her behavior is normal.   ED Course  Procedures (including critical care time) Labs Review Labs Reviewed  BASIC METABOLIC PANEL - Abnormal; Notable for the following:    Glucose, Bld 142 (*)    BUN 27 (*)     Creatinine, Ser 1.31 (*)    GFR calc non Af Amer 35 (*)    GFR calc Af Amer 41 (*)    All other components within normal limits  URINALYSIS, ROUTINE W REFLEX MICROSCOPIC (NOT AT [facility]) - Abnormal; Notable for the following:    APPearance CLOUDY (*)    Hgb urine dipstick LARGE (*)    Protein, ur 30 (*)    Nitrite POSITIVE (*)    Leukocytes, UA LARGE (*)    All other components within normal limits  PROTIME-INR - Abnormal;  Notable for the following:    Prothrombin Time 23.0 (*)    INR 2.05 (*)    All other components within normal limits  CBC - Abnormal; Notable for the following:    Hemoglobin 11.2 (*)    HCT 35.5 (*)    All other components within normal limits  BASIC METABOLIC PANEL - Abnormal; Notable for the following:    BUN 24 (*)    Creatinine, Ser 1.11 (*)    GFR calc non Af Amer 43 (*)    GFR calc Af Amer 50 (*)    All other components within normal limits  URINE MICROSCOPIC-ADD ON - Abnormal; Notable for the following:    Squamous Epithelial / LPF 0-5 (*)    Bacteria, UA MANY (*)    All other components within normal limits  PROTIME-INR - Abnormal; Notable for the following:    Prothrombin Time 25.8 (*)    INR 2.39 (*)    All other components within normal limits  URINE CULTURE  CBC  I-STAT TROPOININ, ED    Imaging Review Dg Chest 2 View  03/18/2015  CLINICAL DATA:  Decreased p.o. intake. Hypotension and bradycardia. Recent hospitalization in November for urinary tract infection. Dehydration. EXAM: CHEST  2 VIEW COMPARISON:  02/12/2015 FINDINGS: Cardiac enlargement with mild pulmonary vascular congestion. Congestive changes are decreased since the previous study. No significant edema. Atelectasis in the lung bases. No blunting of costophrenic angles. No pneumothorax. Degenerative changes in the spine and shoulders. Loss of subacromial space bilaterally suggesting bilateral rotator cuff arthropathies. Calcification of the aorta. Mediastinal contours appear intact.  Degenerative changes in the spine. IMPRESSION: Cardiac enlargement with mild pulmonary vascular congestion, improving since prior study. Atelectasis in the lung bases. Electronically Signed   By: Lucienne Capers M.D.   On: 03/18/2015 18:14   I have personally reviewed and evaluated these images and lab results as part of my medical decision-making.   EKG Interpretation   Date/Time:  Tuesday March 18 2015 16:26:03 EST Ventricular Rate:  35 PR Interval:    QRS Duration: 116 QT Interval:  538 QTC Calculation: 410 R Axis:   -20 Text Interpretation:  Atrial fibrillation Left ventricular hypertrophy  Nonspecific T abnormalities, inferior leads Anterior ST elevation,  probably due to LVH Abnormal ekg Confirmed by Audie Pinto  MD, Cindia Hustead ([id number])  on 03/18/2015 5:28:45 PM      MDM   Final diagnoses:  Bradycardia, drug induced        Leonard Schwartz, MD 03/19/15 1634  Leonard Schwartz, MD 03/19/15 [id number]

## 2015-03-18 NOTE — ED Notes (Addendum)
Pt reports decreased PO intake. BP was taken by home health nurse and found to be 70/50. Pt is A&Ox4. Bradycardic in triage. Pt was recently hospitalized in November for UTI and HR was in 130s. Pt says she is dehydrated. No other c/c. Hx a-fib. Recently started on new BP medications. Has indwelling catheter.

## 2015-03-19 DIAGNOSIS — I5032 Chronic diastolic (congestive) heart failure: Secondary | ICD-10-CM

## 2015-03-19 DIAGNOSIS — R001 Bradycardia, unspecified: Secondary | ICD-10-CM

## 2015-03-19 DIAGNOSIS — I10 Essential (primary) hypertension: Secondary | ICD-10-CM | POA: Insufficient documentation

## 2015-03-19 DIAGNOSIS — E440 Moderate protein-calorie malnutrition: Secondary | ICD-10-CM | POA: Insufficient documentation

## 2015-03-19 DIAGNOSIS — I4892 Unspecified atrial flutter: Secondary | ICD-10-CM

## 2015-03-19 DIAGNOSIS — T50905A Adverse effect of unspecified drugs, medicaments and biological substances, initial encounter: Secondary | ICD-10-CM

## 2015-03-19 LAB — CBC
HCT: 35.5 % — ABNORMAL LOW (ref 36.0–46.0)
Hemoglobin: 11.2 g/dL — ABNORMAL LOW (ref 12.0–15.0)
MCH: 26.6 pg (ref 26.0–34.0)
MCHC: 31.5 g/dL (ref 30.0–36.0)
MCV: 84.3 fL (ref 78.0–100.0)
PLATELETS: 221 10*3/uL (ref 150–400)
RBC: 4.21 MIL/uL (ref 3.87–5.11)
RDW: 14.9 % (ref 11.5–15.5)
WBC: 4.1 10*3/uL (ref 4.0–10.5)

## 2015-03-19 LAB — BASIC METABOLIC PANEL
ANION GAP: 6 (ref 5–15)
BUN: 24 mg/dL — ABNORMAL HIGH (ref 6–20)
CALCIUM: 9.3 mg/dL (ref 8.9–10.3)
CO2: 27 mmol/L (ref 22–32)
CREATININE: 1.11 mg/dL — AB (ref 0.44–1.00)
Chloride: 104 mmol/L (ref 101–111)
GFR, EST AFRICAN AMERICAN: 50 mL/min — AB (ref >60)
GFR, EST NON AFRICAN AMERICAN: 43 mL/min — AB (ref >60)
GLUCOSE: 85 mg/dL (ref 65–99)
Potassium: 3.7 mmol/L (ref 3.5–5.1)
Sodium: 137 mmol/L (ref 135–145)

## 2015-03-19 LAB — PROTIME-INR
INR: 2.39 — AB (ref 0.00–1.49)
Prothrombin Time: 25.8 seconds — ABNORMAL HIGH (ref 11.6–15.2)

## 2015-03-19 MED ORDER — DILTIAZEM HCL ER COATED BEADS 120 MG PO CP24: 120.0000 mg | ORAL_CAPSULE | Freq: Every day | ORAL | Status: DC

## 2015-03-19 MED ORDER — WARFARIN SODIUM 2.5 MG PO TABS
2.5000 mg | ORAL_TABLET | Freq: Once | ORAL | Status: AC
  Administered 2015-03-19: 2.5 mg via ORAL
  Filled 2015-03-19: qty 1

## 2015-03-19 MED ORDER — ENSURE ENLIVE PO LIQD
237.0000 mL | Freq: Two times a day (BID) | ORAL | Status: DC
  Administered 2015-03-19: 237 mL via ORAL

## 2015-03-19 NOTE — Discharge Summary (Signed)
Physician Discharge Summary  Natasha Fletcher [id number] DOB: 03-22-1926 DOA: 03/18/2015  PCP: Myriam Jacobson, MD  Admit date: 03/18/2015 Discharge date: 03/19/2015  Time spent: 30 minutes  Recommendations for Outpatient Follow-up:  Repeat BMET to follow electrolytes and renal function Reassess BP and adjust medications as needed   Discharge Diagnoses:  Bradycardia, drug induced Atrial flutter (HCC) Chronic diastolic CHF (congestive heart failure) (HCC) Malnutrition of moderate degree Essential HTN Glaucoma Hx of breast cancer    Discharge Condition: stable and improved. Will discharge home with instructions to follow up with PCP in 1 week  Diet recommendation: heart healthy/low sodium diet   Filed Weights   03/18/15 2239  Weight: 62.6 kg (138 lb 0.1 oz)    History of present illness:  81 y.o. female with h/o A.flutter, rate controlled on 100mg  metoprolol BID. Patient was apparently recently hospitalized for sepsis, started on Cardizem 120mg  24hr release daily as well due to tachycardia. This was continued at the rehab facility. But seems to have somehow been accidentally switched to Cardizem regular 120mg  once daily when she went home from rehab (as taken per pill bottle that she has in her purse). She took this today, along with her regular 100mg  metoprolol that she takes BID. When home health nurse came to visit today her BP was 70/50, and she was bradycardic in the 30s.  Hospital Course:  1-drug induced Bradycardia: due to wrong use of prescription (taking cardizem IR 120mg  instead of 120mg  extended release) -medication were resumed and tried while inpatient -BP soft and rate well controlled (with rate in the 60's) -will follow up with PCP in 1 week  2-Atrial Flutter -continue cardizem and B-blocker -continue warfarin  3-essential HTN: stable and well controlled  -advise to follow low sodium diet -continue home medications regimen   4-chronic  diastolic heart failure: compensated  -will continue home medication regimen  5-Glaucoma: will continue Xalatan   6-Hx of breast cancer: continue Arimidex   7-moderate Malnutrition -will discharge on feeding supplement (Boost TID)   Procedures:  See below for x-ray reports   Consultations:  None   Discharge Exam: Filed Vitals:   03/19/15 0906 03/19/15 1348  BP:  110/55  Pulse: 67 69  Temp:  97.9 F (36.6 C)  Resp:  20    General: afebrile, AAOX3, no CP and no  Cardiovascular: Rate controlled, no rubs or gallops  Respiratory: CTA bilaterally Abd: soft, NT, positive BS Extremities: no edema, no cyanosis   Discharge Instructions   Discharge Instructions    Diet - low sodium heart healthy    Complete by:  As directed      Discharge instructions    Complete by:  As directed   Take medications as prescribed Please follow up with PCP in 10 days Keep yourself well hydrated  Follow heart healthy diet          Current Discharge Medication List    START taking these medications   Details  diltiazem (CARDIZEM CD) 120 MG 24 hr capsule Take 1 capsule (120 mg total) by mouth daily. Qty: 30 capsule, Refills: 1      CONTINUE these medications which have NOT CHANGED   Details  anastrozole (ARIMIDEX) 1 MG tablet TAKE 1 TABLET (1 MG TOTAL) BY MOUTH DAILY. Qty: 90 tablet, Refills: 3   Associated Diagnoses: Cancer of central portion of female breast, unspecified laterality    furosemide (LASIX) 20 MG tablet Take 20 mg by mouth 2 (two) times daily.  latanoprost (XALATAN) 0.005 % ophthalmic solution Place 1 drop into both eyes at bedtime.   Associated Diagnoses: Cancer of central portion of female breast (HCC)    meclizine (ANTIVERT) 25 MG tablet Take 12.5 mg by mouth 2 (two) times daily.    Menthol, Topical Analgesic, (ICE BLUE EX) Apply 1 application topically 2 (two) times daily as needed (joint pain).    metoprolol (LOPRESSOR) 100 MG tablet Take 1 tablet (100  mg total) by mouth 2 (two) times daily. Qty: 60 tablet, Refills: 6    potassium chloride SA (K-DUR,KLOR-CON) 20 MEQ tablet Take 1 tablet (20 mEq total) by mouth once. X 5 days Qty: 5 tablet, Refills: 0    traMADol (ULTRAM) 50 MG tablet Take 50 mg by mouth every 6 (six) hours as needed for moderate pain.    warfarin (COUMADIN) 5 MG tablet TAKE 1 TABLET (5 MG TOTAL) BY MOUTH DAILY. Qty: 30 tablet, Refills: 3    feeding supplement (BOOST / RESOURCE BREEZE) LIQD Take 1 Container by mouth 3 (three) times daily between meals. Qty: 90 Container, Refills: 0      STOP taking these medications     cephALEXin (KEFLEX) 500 MG capsule        Allergies  Allergen Reactions  . Lactose Intolerance (Gi) Other (See Comments)  . Milk-Related Compounds Other (See Comments)    Upset stomach   . Sulfa Antibiotics Other (See Comments)    Reaction many years ago-unknown   Follow-up Information    Follow up with ROBERTS, [name], MD. Schedule an appointment as soon as possible for a visit in 1 week.   Specialty:  Internal Medicine   Contact information:   [address] [place] [address] [phone]       The results of significant diagnostics from this hospitalization (including imaging, microbiology, ancillary and laboratory) are listed below for reference.    Significant Diagnostic Studies: Dg Chest 2 View  03/18/2015  CLINICAL DATA:  Decreased p.o. intake. Hypotension and bradycardia. Recent hospitalization in November for urinary tract infection. Dehydration. EXAM: CHEST  2 VIEW COMPARISON:  02/12/2015 FINDINGS: Cardiac enlargement with mild pulmonary vascular congestion. Congestive changes are decreased since the previous study. No significant edema. Atelectasis in the lung bases. No blunting of costophrenic angles. No pneumothorax. Degenerative changes in the spine and shoulders. Loss of subacromial space bilaterally suggesting bilateral rotator cuff arthropathies.  Calcification of the aorta. Mediastinal contours appear intact. Degenerative changes in the spine. IMPRESSION: Cardiac enlargement with mild pulmonary vascular congestion, improving since prior study. Atelectasis in the lung bases. Electronically Signed   By: Lucienne Capers M.D.   On: 03/18/2015 18:14    Microbiology: Recent Results (from the past 240 hour(s))  Urine culture     Status: None (Preliminary result)   Collection Time: 03/18/15  5:45 PM  Result Value Ref Range Status   Specimen Description URINE, CATHETERIZED  Final   Special Requests NONE  Final   Culture   Final    CULTURE REINCUBATED FOR BETTER GROWTH Performed at [facility]    Report Status PENDING  Incomplete     Labs: Basic Metabolic Panel:  Recent Labs Lab 03/18/15 1653 03/19/15 0512  NA 139 137  K 4.3 3.7  CL 106 104  CO2 25 27  GLUCOSE 142* 85  BUN 27* 24*  CREATININE 1.31* 1.11*  CALCIUM 9.8 9.3   CBC:  Recent Labs Lab 03/18/15 1653 03/19/15 0512  WBC 5.5 4.1  HGB 12.5 11.2*  HCT 40.1 35.5*  MCV 85.9 84.3  PLT 280 221    Signed:  Barton Dubois  [facility] 03/19/2015, 5:09 PM

## 2015-03-19 NOTE — Progress Notes (Signed)
[facility] for Warfarin Indication: atrial fibrillation  Allergies  Allergen Reactions  . Lactose Intolerance (Gi) Other (See Comments)  . Milk-Related Compounds Other (See Comments)    Upset stomach   . Sulfa Antibiotics Other (See Comments)    Reaction many years ago-unknown    Patient Measurements: Height: 5\' 4"  (162.6 cm) Weight: 138 lb 0.1 oz (62.6 kg) IBW/kg (Calculated) : 54.7  Vital Signs: Temp: 97.8 F (36.6 C) (12/14 0446) Temp Source: Oral (12/14 0446) BP: 151/73 mmHg (12/14 0446) Pulse Rate: 47 (12/14 0446)  Labs:  Recent Labs  03/18/15 1653 03/18/15 1700 03/19/15 0512  HGB 12.5  --  11.2*  HCT 40.1  --  35.5*  PLT 280  --  221  LABPROT  --  23.0* 25.8*  INR  --  2.05* 2.39*  CREATININE 1.31*  --  1.11*    Estimated Creatinine Clearance: 30.8 mL/min (by C-G formula based on Cr of 1.11).    Medications:  Scheduled:  . anastrozole  1 mg Oral Daily  . diltiazem  120 mg Oral Daily  . feeding supplement  1 Container Oral TID BM  . furosemide  20 mg Oral BID  . latanoprost  1 drop Both Eyes QHS  . meclizine  12.5 mg Oral BID  . metoprolol  100 mg Oral BID  . potassium chloride SA  20 mEq Oral Daily  . Warfarin - Pharmacist Dosing Inpatient   Does not apply q1800  . zolpidem  5 mg Oral Once   Infusions:    Assessment:  81 yr old female with h/o AFib and on Warfarin 5mg  daily PTA.  Last reported dose taken 12/13 @ 11:30  INR upon admission today = 2.05 (therapeutic)  Patient in ED for low BP  Today's labs, 03/19/2015  INR therapeutic but moderate rate of INR rise overnight, ? Due to decreased PO intake as reported in ED RN note.   CBC: Hgb decreased overnight, pltc WNL  Cardiac diet  No major drug-drug interactions  Goal of Therapy:  INR 2-3   Plan:   Warfarin 2.5mg  x1 tonight, reduce dose for rate of rise with recent decreased oral intake.  Check daily INR  Doreene Eland, PharmD, BCPS.    Pager: [phone] 03/19/2015 8:44 AM

## 2015-03-19 NOTE — Progress Notes (Signed)
Went over all discharge paperwork with patient and brother.  All questions answered.  Discharge summary given. VSS.  Pt wheeled out with brother.

## 2015-03-19 NOTE — Care Management Note (Signed)
Case Management Note  Patient Details  Name: LACHAKA MASSER MRN: [id number] Date of Birth: [birth year]  Subjective/Objective:    Admitted with bradycardia r/t medications                 Action/Plan: Discharge planning, spoke with patient at bedside. Patient states she lives at home with several family members, she does some cooking and they cook as well. She ambulates with cane and walker but says she is independent inside her home. Does not currently drive but hopes to regain her privilege soon. Currently family members drive her to doctors and grocery. States she has had HH in the past but her PCP states she does not need anymore.    Expected Discharge Date:                  Expected Discharge Plan:  Home/Self Care  In-House Referral:  NA  Discharge planning Services  CM Consult  Post Acute Care Choice:  NA Choice offered to:  NA  DME Arranged:  N/A DME Agency:  NA  HH Arranged:  NA HH Agency:  NA  Status of Service:  Completed, signed off  Medicare Important Message Given:    Date Medicare IM Given:    Medicare IM give by:    Date Additional Medicare IM Given:    Additional Medicare Important Message give by:     If discussed at [place] [facility] of Stay Meetings, dates discussed:    Additional Comments:  Guadalupe Maple, RN 03/19/2015, 12:25 PM

## 2015-03-19 NOTE — Progress Notes (Signed)
Ambulated with patient in the hall.  Patient steady on her feet with walker.  Pt stated she has a walker and canes at home. Says she does not need to see a PT or have any HH services.  Pt ambulated 900 ftt, tolerated well.

## 2015-03-19 NOTE — Progress Notes (Signed)
Initial Nutrition Assessment  DOCUMENTATION CODES:   Non-severe (moderate) malnutrition in context of acute illness/injury  INTERVENTION:  - Will d/c Boost Breeze - Will order Ensure Enlive po BID, each supplement provides 350 kcal and 20 grams of protein - RD will continue to monitor for needs  NUTRITION DIAGNOSIS:   Inadequate oral intake related to poor appetite as evidenced by per patient/family report.  GOAL:   Patient will meet greater than or equal to 90% of their needs  MONITOR:   PO intake, Supplement acceptance, Weight trends, Labs, Skin, I & O's  REASON FOR ASSESSMENT:   Malnutrition Screening Tool  ASSESSMENT:   81 y.o. female with h/o A.flutter, rate controlled on 100mg  metoprolol BID. Patient was apparently recently hospitalized for sepsis, started on Cardizem 120mg  24hr release daily as well due to tachycardia. This was continued at the rehab facility. But seems to have somehow been accidentally switched to Cardizem regular 120mg  once daily when she went home from rehab (as taken per pill bottle that she has in her purse). She took this today, along with her regular 100mg  metoprolol that she takes BID. When home health nurse came to visit today her BP was 70/50, and she was bradycardic in the 30s.  Pt seen for MST. BMI indicates normal weight. Per chart review, pt ate 100% of breakfast this AM which she states consisted of raisin bran with soy milk, vegetable omelet, and peaches. Pt denies chewing or swallowing difficulties this AM or PTA. She states that she is often very active and moves around frequently throughout the day. Pt states that for a few days PTA her appetite was decreased due to not feeling well. She denies abdominal pain or nausea with eating this AM. She has already placed lunch order.  Pt states that she is lactose intolerant and asks which items on the menu would be okay for her in light of this; this was discussed with pt and she was  appreciative. Boost Gwyneth Revels has been ordered TID and pt states she likes this supplement but that she would prefer to have Ensure, which she drinks at home. Will order Ensure Enlive BID (this product does not contain lactose).   Physical assessment shows mild muscle wasting to upper body. Per chart review, pt has lost 2 lbs (1.4% body weight) in the past 1 month which is not significant for time frame.   Pt likely meeting needs at this time. Medications reviewed; 20 mg Lasix BID. Labs reviewed; BUN/creatinine elevated but trending down, GFR: 50.   Diet Order:  Diet Heart Room service appropriate?: Yes; Fluid consistency:: Thin  Skin:  Reviewed, no issues  Last BM:  PTA  Height:   Ht Readings from Last 1 Encounters:  03/18/15 5\' 4"  (1.626 m)    Weight:   Wt Readings from Last 1 Encounters:  03/18/15 138 lb 0.1 oz (62.6 kg)    Ideal Body Weight:  54.54 kg (kg)  BMI:  Body mass index is 23.68 kg/(m^2).  Estimated Nutritional Needs:   Kcal:  1300-1500  Protein:  55-65 grams  Fluid:  2 L/day  EDUCATION NEEDS:   No education needs identified at this time     Jarome Matin, RD, LDN Inpatient Clinical Dietitian Pager # [phone] After hours/weekend pager # [phone]

## 2015-03-21 LAB — URINE CULTURE: Culture: >=100000

## 2015-03-21 NOTE — Telephone Encounter (Signed)
Aware; if BP reamins low will need to hold BP meds

## 2015-03-27 ENCOUNTER — Other Ambulatory Visit: Payer: Self-pay | Admitting: Adult Health

## 2015-04-14 ENCOUNTER — Encounter: Payer: Self-pay | Admitting: Cardiovascular Disease

## 2015-04-15 ENCOUNTER — Other Ambulatory Visit: Payer: Self-pay | Admitting: Adult Health

## 2015-04-18 ENCOUNTER — Ambulatory Visit (INDEPENDENT_AMBULATORY_CARE_PROVIDER_SITE_OTHER): Payer: Medicare Other | Admitting: Physician Assistant

## 2015-04-18 ENCOUNTER — Encounter: Payer: Self-pay | Admitting: Physician Assistant

## 2015-04-18 ENCOUNTER — Ambulatory Visit (INDEPENDENT_AMBULATORY_CARE_PROVIDER_SITE_OTHER): Payer: Medicare Other | Admitting: Pharmacist Clinician (PhC)/ Clinical Pharmacy Specialist

## 2015-04-18 VITALS — BP 110/60 | HR 58 | Ht 64.5 in | Wt 146.6 lb

## 2015-04-18 DIAGNOSIS — I5032 Chronic diastolic (congestive) heart failure: Secondary | ICD-10-CM | POA: Diagnosis not present

## 2015-04-18 DIAGNOSIS — I4892 Unspecified atrial flutter: Secondary | ICD-10-CM | POA: Diagnosis not present

## 2015-04-18 DIAGNOSIS — I480 Paroxysmal atrial fibrillation: Secondary | ICD-10-CM

## 2015-04-18 DIAGNOSIS — Z7901 Long term (current) use of anticoagulants: Secondary | ICD-10-CM

## 2015-04-18 LAB — POCT INR: INR: 3.5

## 2015-04-18 NOTE — Patient Instructions (Addendum)
1.) Return to activities as tolerated. Do not over exert yourself. LISTEN TO YOUR BODY!  2.) Rosaria Ferries request for you to daily weight checks and keep a log.  3.) No changes have been made today in your therapy. Continue medications as prescribed. ( kristin will tell you how to take your warfarin.)  4.) keep appointment scheduled in March to see Dr. Claiborne Billings.

## 2015-04-18 NOTE — Progress Notes (Signed)
Cardiology Office Note   Date:  04/18/2015   ID:  Natasha Fletcher, DOB 01/12/1926, MRN [id number]  PCP:  Myriam Jacobson, MD  Cardiologist:  Dr Meredeth Ide, PA-C   History of Present Illness: Natasha Fletcher is a 82 y.o. female with a history of atrial flutter on metoprolol, Cardizem and coumadin, HTN, D-CHF, glaucoma, breast CA  Hospitalized 12/13-12/14 for bradycardia from a combination of calcium channel blockers and metoprolol, dose is decreased.  Natasha Fletcher presents for follow-up.  Natasha Fletcher is here today with her brother. She has done pretty well since going home from the hospital. She hasn't had any more episodes of being lightheaded. She is trying to increase her activity and goes up and down steps on a regular basis as she lives in a split level house. She tries to stay busy and enjoys doing things around the house. She gets short of breath sometimes and tends to push herself. She is compliant with her cardiac medications. She has recently been started on prednisone for 10 days, but is not sure why, most likely for arthritis.  She has not had lower extremity edema, orthopnea or PND. She is tracking her weight on a daily basis and it has not changed significantly.  She is compliant with her Coumadin and is not having any bleeding issues. She needs a Coumadin check, which we will get today in the office.  Her major problem right now is that she is having trouble sleeping. She admits to being a Research officer, trade [employer]. She is able to get to sleep, but then will wake up after a few hours and not be able to go back to sleep. Because of this, she does not feel rested. However, she is not waking up with shortness of breath.  She is a little concerned because her weight has been decreasing, but seems to understand that she has lost both weight and fluid, and she is better off without the fluid.   She has no awareness of a rapid or irregular heart rate. She has no presyncope or  syncope.  She has had several UTIs in the last year or so. They generally start out with abdominal pain. She has been septic at least once. The other day, she was walking and had abdominal pain which was severe but resolved without intervention. She has not had any dysuria.   Past Medical History  Diagnosis Date  . Hypertension   . Asthma   . Glaucoma   . Incontinence   . Wears dentures   . Wears glasses   . Thyroid disease     "something years ago"  . GERD (gastroesophageal reflux disease)   . Cancer (HCC)     rt breast  . Edema leg   . Fast breathing     "because of pill"  . Pneumonia     h/o younger  . Arthritis     "all over"  . S/P total knee arthroplasty   . Glaucoma   . Swelling     bilateral feet/ legs, more left.  . Atrial fibrillation ([place])   . Diabetes mellitus without complication ([place])   . UTI (urinary tract infection) 02/12/2015    Past Surgical History  Procedure Laterality Date  . Total hip arthroplasty Bilateral   . Shoulder surgery Left   . Appendectomy    . Carpal tunnel release Bilateral   . Tonsillectomy    . Colonoscopy    . Abdominal hysterectomy    .  Eye surgery      both cataracts  . Breast biopsy  04/27/2011    Procedure: BREAST BIOPSY WITH NEEDLE LOCALIZATION;  Surgeon: Edward Jolly, MD;  Location: [facility];  Service: General;  Laterality: Right;  right needle localized breast lumpectomy   . Breast lumpectomy  04/27/11    right breast by [facility]  . Total knee arthroplasty Right 02/19/2013    Procedure: RIGHT TOTAL KNEE ARTHROPLASTY;  Surgeon: Gearlean Alf, MD;  Location: [facility];  Service: Orthopedics;  Laterality: Right;  . Total knee arthroplasty Left 10/15/2013    Procedure: LEFT TOTAL KNEE ARTHROPLASTY;  Surgeon: Gearlean Alf, MD;  Location: [facility];  Service: Orthopedics;  Laterality: Left;  . Cardioversion N/A 02/01/2014    Procedure: CARDIOVERSION;  Surgeon: Candee Furbish, MD;  [facility];   Service: Cardiovascular;  Laterality: N/A;  . Tee without cardioversion N/A 02/01/2014    Procedure: TRANSESOPHAGEAL ECHOCARDIOGRAM (TEE);  Surgeon: Candee Furbish, MD;  Location[facility] END[facility];  Service: Cardiovascular;  Laterality: N/A;    Current Outpatient Prescriptions  Medication Sig Dispense Refill  . anastrozole (ARIMIDEX) 1 MG tablet TAKE 1 TABLET (1 MG TOTAL) BY MOUTH DAILY. 90 tablet 3  . diltiazem (CARDIZEM CD) 120 MG 24 hr capsule Take 1 capsule (120 mg total) by mouth daily. 30 capsule 1  . feeding supplement (BOOST / RESOURCE BREEZE) LIQD Take 1 Container by mouth 3 (three) times daily between meals. 90 Container 0  . furosemide (LASIX) 20 MG tablet Take 20 mg by mouth 2 (two) times daily.     Marland Kitchen latanoprost (XALATAN) 0.005 % ophthalmic solution Place 1 drop into both eyes at bedtime.    . meclizine (ANTIVERT) 25 MG tablet Take 12.5 mg by mouth 3 (three) times daily.     . Menthol, Topical Analgesic, (ICE BLUE EX) Apply 1 application topically 2 (two) times daily as needed (joint pain).    . metoprolol (LOPRESSOR) 100 MG tablet Take 1 tablet (100 mg total) by mouth 2 (two) times daily. 60 tablet 6  . predniSONE (DELTASONE) 10 MG tablet Take 10 mg by mouth 2 (two) times daily. (For 10 days)  0  . traMADol (ULTRAM) 50 MG tablet Take 50 mg by mouth every 6 (six) hours as needed for moderate pain.    . traZODone (DESYREL) 50 MG tablet Take 50 mg by mouth at bedtime.  0  . warfarin (COUMADIN) 5 MG tablet TAKE 1 TABLET (5 MG TOTAL) BY MOUTH DAILY. 30 tablet 3   No current facility-administered medications for this visit.    Allergies:   Lactose intolerance (gi); Milk-related compounds; and Sulfa antibiotics    Social History:  The patient  reports that she quit smoking about 40 years ago. Her smoking use included Cigarettes. She smoked 0.00 packs per day for 30 years. She has never used smokeless tobacco. She reports that she drinks alcohol. She reports that she does not use illicit  drugs.   Family History:  The patient's family history includes Heart disease in her mother; Hypertension in her father and mother; Stroke in her brother, father, and mother. There is no history of Heart attack.    ROS:  Please see the history of present illness. All other systems are reviewed and negative.    PHYSICAL EXAM: VS:  BP 110/60 mmHg  Pulse 58  Ht 5' 4.5" (1.638 m)  Wt 146 lb 9.6 oz (66.497 kg)  BMI 24.78 kg/m2 , BMI Body mass index  is 24.78 kg/(m^2). GEN: Well nourished, well developed, female in no acute distress HEENT: normal for age  Neck: Minimal JVD, no significant hepatojugular reflux, no carotid bruit, no masses Cardiac: Irregular rate and rhythm; no murmur, no rubs, or gallops Respiratory:  clear to auscultation bilaterally, normal work of breathing GI: soft, nontender, nondistended, + BS MS: no deformity or atrophy; no edema; distal pulses are 2+ in all 4 extremities  Skin: warm and dry, no rash Neuro:  Strength and sensation are intact Psych: euthymic mood, full affect   EKG:  EKG is not ordered today.  Recent Labs: 02/16/2015: ALT 77*; TSH 2.522 02/17/2015: Magnesium 1.7 03/19/2015: BUN 24*; Creatinine, Ser 1.11*; Hemoglobin 11.2*; Platelets 221; Potassium 3.7; Sodium 137    Lipid Panel No results found for: CHOL, TRIG, HDL, CHOLHDL, VLDL, LDLCALC, LDLDIRECT   Wt Readings from Last 3 Encounters:  04/18/15 146 lb 9.6 oz (66.497 kg)  03/18/15 138 lb 0.1 oz (62.6 kg)  02/19/15 137 lb 6.4 oz (62.324 kg)     Other studies Reviewed: Additional studies/ records that were reviewed today include: Hospital records and previous notes, ECGs..  ASSESSMENT AND PLAN:  1.  Atrial flutter: Her heart rate was elevated at times during auscultation. She is compliant with her diltiazem and metoprolol. We cannot increase them without risk of causing baseline bradycardia as her heart rate was 58 when it was initially checked. Blood pressure is well controlled. Her  heart rate may be elevated a bit above normal because of the prednisone which is temporary. Continue current metoprolol and Cardizem doses, call for symptoms.  2. Diastolic CHF: She is monitoring her weight and compliant with her Lasix. Her potassium was discontinued. She will need a BMET checked at her next Coumadin clinic appointment.  3. Abdominal pain: The episode was brief but she has had symptoms like this when she was getting a UTI. She is encouraged to follow-up with primary care.  4. Chronic anticoagulation with Coumadin: CHADS2VASC=6 - management per the Coumadin clinic  Current medicines are reviewed at length with the patient today.  The patient does not have concerns regarding medicines.  The following changes have been made:  no change  Labs/ tests ordered today include:   BMET   Disposition:   FU with Dr. Claiborne Billings  Signed, Lenoard Aden  04/18/2015 5:04 PM    [facility] [address], [place], [place]  [address] Phone: [phone]; Fax: [phone]

## 2015-04-24 ENCOUNTER — Other Ambulatory Visit: Payer: Self-pay | Admitting: Cardiovascular Disease

## 2015-04-24 MED ORDER — WARFARIN SODIUM 5 MG PO TABS: ORAL_TABLET | ORAL | Status: DC

## 2015-05-02 ENCOUNTER — Ambulatory Visit (INDEPENDENT_AMBULATORY_CARE_PROVIDER_SITE_OTHER): Payer: Medicare Other | Admitting: Pharmacist Clinician (PhC)/ Clinical Pharmacy Specialist

## 2015-05-02 DIAGNOSIS — I480 Paroxysmal atrial fibrillation: Secondary | ICD-10-CM

## 2015-05-02 DIAGNOSIS — Z7901 Long term (current) use of anticoagulants: Secondary | ICD-10-CM | POA: Diagnosis not present

## 2015-05-02 DIAGNOSIS — I4892 Unspecified atrial flutter: Secondary | ICD-10-CM

## 2015-05-02 LAB — POCT INR: INR: 3.1

## 2015-05-03 ENCOUNTER — Other Ambulatory Visit: Payer: Self-pay | Admitting: Adult Health

## 2015-05-09 ENCOUNTER — Ambulatory Visit
Admission: RE | Admit: 2015-05-09 | Discharge: 2015-05-09 | Disposition: A | Discharge status: Home / Self Care | Payer: Medicare Other | Source: Ambulatory Visit | Attending: Internal Medicine | Admitting: Internal Medicine

## 2015-05-09 ENCOUNTER — Other Ambulatory Visit: Payer: Self-pay | Admitting: Physician Assistant

## 2015-05-09 ENCOUNTER — Other Ambulatory Visit: Payer: Self-pay | Admitting: Internal Medicine

## 2015-05-09 DIAGNOSIS — R0602 Shortness of breath: Secondary | ICD-10-CM

## 2015-05-09 LAB — BASIC METABOLIC PANEL
BUN: 23 mg/dL (ref 7–25)
CO2: 25 mmol/L (ref 20–31)
Calcium: 9.1 mg/dL (ref 8.6–10.4)
Chloride: 105 mmol/L (ref 98–110)
Creat: 1.04 mg/dL — ABNORMAL HIGH (ref 0.60–0.88)
GLUCOSE: 85 mg/dL (ref 65–99)
POTASSIUM: 4.3 mmol/L (ref 3.5–5.3)
SODIUM: 139 mmol/L (ref 135–146)

## 2015-05-14 ENCOUNTER — Encounter: Payer: Self-pay | Admitting: Cardiology

## 2015-05-14 ENCOUNTER — Ambulatory Visit (INDEPENDENT_AMBULATORY_CARE_PROVIDER_SITE_OTHER): Payer: Medicare Other | Admitting: Cardiology

## 2015-05-14 ENCOUNTER — Telehealth: Payer: Self-pay | Admitting: Cardiovascular Disease

## 2015-05-14 VITALS — BP 130/72 | HR 120 | Ht 64.5 in | Wt 146.0 lb

## 2015-05-14 DIAGNOSIS — I480 Paroxysmal atrial fibrillation: Secondary | ICD-10-CM

## 2015-05-14 MED ORDER — DILTIAZEM HCL ER COATED BEADS 120 MG PO CP24: 120.0000 mg | ORAL_CAPSULE | Freq: Every day | ORAL | Status: DC

## 2015-05-14 NOTE — Progress Notes (Signed)
05/14/2015 Natasha Fletcher   10-07-1925  [id number]  Primary Physician Myriam Jacobson, MD Primary Cardiologist: Dr. Claiborne Billings   Reason for Visit/CC: Atrial Fibrillation  HPI: 82 y/o female followed by Dr. Claiborne Billings with a h/o PAF, on chronic anticoagulation therapy with Coumadin, chronic diastolic CHF and HTN. Her INRs are followed in our coumadin clinic. In 2015, she required cardioversion which was successful. She was doing well for a period of time. Recently, in Dec 2016, she was admitted to [facility] with a UTI and went back into atrial fibrillation. Her rate control agents (cardizem and metoprolol) were increased, however she developed profound bradycardia and hypotension. HR was in the 30s. Subsequently, her doses of Cardizem and metoprolol were reduced and she had no recurrent issues with bradycardia.   She presents to clinic today with complaint of sudden onset of dyspnea and tachy palpitations that started around 1:30 am earlier today. She felt very poorly with weakness and fatigue. Symptoms persisted prompting her to report to her PCP office. An EKG was obtained and she was noted to be in atrial fibrillation with a rate of 116 bpm. She was instructed to take an additional Cardizem tablet at PCP office (total of 240 mg today). She was instructed to report to our office for further recommendations.   On arrival to our clinic, her HR and symptoms have improved. HR is now in the upper 60s. She denies any further dyspnea, nor palpitations. No CP. BP is stable.    Current Outpatient Prescriptions  Medication Sig Dispense Refill  . anastrozole (ARIMIDEX) 1 MG tablet TAKE 1 TABLET BY MOUTH EVERY DAY 30 tablet 2  . diltiazem (CARDIZEM CD) 120 MG 24 hr capsule Take 1 capsule (120 mg total) by mouth daily. 30 capsule 1  . feeding supplement (BOOST / RESOURCE BREEZE) LIQD Take 1 Container by mouth 3 (three) times daily between meals. 90 Container 0  . furosemide (LASIX) 20 MG tablet Take 40 mg by mouth  2 (two) times daily.     Marland Kitchen HYDROcodone-acetaminophen (NORCO) 10-325 MG tablet Take 1 tablet by mouth 2 (two) times daily as needed (leg pain).   0  . latanoprost (XALATAN) 0.005 % ophthalmic solution Place 1 drop into both eyes at bedtime.    . meclizine (ANTIVERT) 25 MG tablet Take 12.5 mg by mouth 3 (three) times daily.     . Menthol, Topical Analgesic, (ICE BLUE EX) Apply 1 application topically 2 (two) times daily as needed (joint pain).    . methenamine-sodium biphosphonate (UROQUID) 500-500 MG tablet Take 1 tablet by mouth 3 (three) times daily.    . metoprolol (LOPRESSOR) 100 MG tablet Take 1 tablet (100 mg total) by mouth 2 (two) times daily. 60 tablet 6  . traZODone (DESYREL) 50 MG tablet Take 50 mg by mouth at bedtime.  0  . warfarin (COUMADIN) 5 MG tablet Take 1 tablet by mouth daily or as directed by coumadin clinic 90 tablet 0   No current facility-administered medications for this visit.    Allergies  Allergen Reactions  . Lactose Intolerance (Gi) Other (See Comments)  . Milk-Related Compounds Other (See Comments)    Upset stomach   . Sulfa Antibiotics Other (See Comments)    Reaction many years ago-unknown    Social History   Social History  . Marital Status: Single    Spouse Name: N/A  . Number of Children: N/A  . Years of Education: N/A   Occupational History  . Not on file.  Social History Main Topics  . Smoking status: Former Smoker -- 0.00 packs/day for 30 years    Types: Cigarettes    Quit date: 04/21/1975  . Smokeless tobacco: Never Used  . Alcohol Use: Yes     Comment: occasionally  . Drug Use: No  . Sexual Activity: Not Currently   Other Topics Concern  . Not on file   Social History Narrative   From SNF, getting PT and using cane.       Review of Systems: General: negative for chills, fever, night sweats or weight changes.  Cardiovascular: negative for chest pain, dyspnea on exertion, edema, orthopnea, palpitations, paroxysmal nocturnal  dyspnea or shortness of breath Dermatological: negative for rash Respiratory: negative for cough or wheezing Urologic: negative for hematuria Abdominal: negative for nausea, vomiting, diarrhea, bright red blood per rectum, melena, or hematemesis Neurologic: negative for visual changes, syncope, or dizziness All other systems reviewed and are otherwise negative except as noted above.    Blood pressure 130/72, pulse 120, height 5' 4.5" (1.638 m), weight 146 lb (66.225 kg).  General appearance: alert, cooperative and no distress Neck: no carotid bruit and no JVD Lungs: clear to auscultation bilaterally Heart: regular rate and rhythm and regular rate Extremities: 1+ LEE Pulses: 2+ and symmetric Skin: warm and dry Neurologic: Grossly normal  EKG Atrial Fibrillation 111 bpm (PCP office EKG). Clinic pulse rate is 68   ASSESSMENT AND PLAN:   1. Atrial Fibrillation: patient remains in atrial fibrillation. Her HR is better controlled after taking an additional Cardizem tablet earlier today. HR is currently in the upper 60s. BP is stable. Unfortunately, she has been unable to tolerate high dose of Cardizem and metoprolol given profound bradycardia with rates in the 30s during a hospitalization in 03/2015. She has remained in atrial fibrillation for several months and is highly symptomatic when rate increases. Give medical therapy is limited by potential recurrence of bradycardia, will will plan for DCCV in several weeks. Her INR has not been followed on a weekly basis. We will plan for  INR checks for 3 consecutive weeks. F/u with APP in 3 weeks for repeat EKG. Plan for DCCV if still in atrial fibrillation. Also check BMP in 2 days when she presents for INR check (PCP increased Lasix dose from 20 mg to 40 mg daily). Will need to ensure K levels remain stable as hypokalemia may potentially exacerbate her atrial fibrillation. Her volume appears stable today. No edema, elevated JVD or pulmonic rales.     PLAN    SIMMONS, BRITTAINY PA-C 05/14/2015 12:07 PM

## 2015-05-14 NOTE — Telephone Encounter (Signed)
New message    2/8@ 10:40am Dr. Lorene Dy office callling/ pt is in A-fib, SOB, Rapid HR/ Dr.Kelly pt,  Made appt for pt to see PA today at 11:30am on FLEX

## 2015-05-14 NOTE — Telephone Encounter (Signed)
Acknowledged.

## 2015-05-14 NOTE — Patient Instructions (Addendum)
Medication Instructions:  Your physician recommends that you continue on your current medications as directed. Please refer to the Current Medication list given to you today.   Labwork: 05/16/15:  BMET (WHEN YOU SEE KRISTEN, PHARM D @ [facility]  Testing/Procedures: NONE ORDERED  Follow-Up: Your physician recommends that you schedule a follow-up appointment in: [place] (PT HAS APPT 05/16/15, SO PT NEEDS 2 MORE APPTS TO SEE KRISTEN. Your physician recommends that you schedule a follow-up appointment in: 2 [place], PA-C (WITH REPEAT EKG & DISCUSS POSSIBLE CARDIOVERSION)  (ON SAME DAY AS SEE KRISTEN) Your physician recommends that you keep your scheduled follow-up appointment with DR. KELLY as scheduled    Any Other Special Instructions Will Be Listed Below (If Applicable).   If you need a refill on your cardiac medications before your next appointment, please call your pharmacy.

## 2015-05-16 ENCOUNTER — Encounter: Payer: Medicare Other | Admitting: Pharmacist Clinician (PhC)/ Clinical Pharmacy Specialist

## 2015-05-16 ENCOUNTER — Ambulatory Visit (INDEPENDENT_AMBULATORY_CARE_PROVIDER_SITE_OTHER): Payer: Medicare Other | Admitting: Pharmacist Clinician (PhC)/ Clinical Pharmacy Specialist

## 2015-05-16 ENCOUNTER — Other Ambulatory Visit: Payer: Self-pay | Admitting: *Deleted

## 2015-05-16 DIAGNOSIS — I480 Paroxysmal atrial fibrillation: Secondary | ICD-10-CM | POA: Diagnosis not present

## 2015-05-16 DIAGNOSIS — I4892 Unspecified atrial flutter: Secondary | ICD-10-CM | POA: Diagnosis not present

## 2015-05-16 DIAGNOSIS — Z7901 Long term (current) use of anticoagulants: Secondary | ICD-10-CM | POA: Diagnosis not present

## 2015-05-16 LAB — BASIC METABOLIC PANEL
BUN: 23 mg/dL (ref 7–25)
CALCIUM: 9 mg/dL (ref 8.6–10.4)
CO2: 26 mmol/L (ref 20–31)
CREATININE: 1.13 mg/dL — AB (ref 0.60–0.88)
Chloride: 104 mmol/L (ref 98–110)
GLUCOSE: 105 mg/dL — AB (ref 65–99)
Potassium: 4 mmol/L (ref 3.5–5.3)
SODIUM: 141 mmol/L (ref 135–146)

## 2015-05-16 LAB — POCT INR: INR: 4.4

## 2015-05-19 ENCOUNTER — Telehealth: Payer: Self-pay | Admitting: *Deleted

## 2015-05-19 NOTE — Telephone Encounter (Signed)
Patient notified of lab results and recommendations. 

## 2015-05-19 NOTE — Telephone Encounter (Signed)
-----   Message from Lonn Georgia, PA-C sent at 05/12/2015  1:26 PM EST ----- Please let her know her potassium and kidney function are fine. Continue to track weights and eat a low-sodium diet

## 2015-05-22 ENCOUNTER — Other Ambulatory Visit: Payer: Self-pay | Admitting: Adult Health

## 2015-05-23 ENCOUNTER — Other Ambulatory Visit: Payer: Self-pay | Admitting: Pharmacist Clinician (PhC)/ Clinical Pharmacy Specialist

## 2015-05-23 ENCOUNTER — Ambulatory Visit (INDEPENDENT_AMBULATORY_CARE_PROVIDER_SITE_OTHER): Payer: Medicare Other | Admitting: Pharmacist Clinician (PhC)/ Clinical Pharmacy Specialist

## 2015-05-23 DIAGNOSIS — I4892 Unspecified atrial flutter: Secondary | ICD-10-CM | POA: Diagnosis not present

## 2015-05-23 DIAGNOSIS — Z7901 Long term (current) use of anticoagulants: Secondary | ICD-10-CM

## 2015-05-23 LAB — POCT INR: INR: 2.9

## 2015-05-23 MED ORDER — WARFARIN SODIUM 5 MG PO TABS: ORAL_TABLET | ORAL | Status: DC

## 2015-05-28 ENCOUNTER — Encounter: Payer: Self-pay | Admitting: Physician Assistant

## 2015-05-28 ENCOUNTER — Encounter: Payer: Self-pay | Admitting: Cardiovascular Disease

## 2015-05-28 ENCOUNTER — Ambulatory Visit (INDEPENDENT_AMBULATORY_CARE_PROVIDER_SITE_OTHER): Payer: Medicare Other | Admitting: Physician Assistant

## 2015-05-28 ENCOUNTER — Ambulatory Visit (INDEPENDENT_AMBULATORY_CARE_PROVIDER_SITE_OTHER): Payer: Medicare Other | Admitting: Pharmacist Clinician (PhC)/ Clinical Pharmacy Specialist

## 2015-05-28 ENCOUNTER — Other Ambulatory Visit: Payer: Self-pay | Admitting: Adult Health

## 2015-05-28 VITALS — BP 130/80 | HR 69 | Ht 64.5 in | Wt 141.0 lb

## 2015-05-28 DIAGNOSIS — R5383 Other fatigue: Secondary | ICD-10-CM

## 2015-05-28 DIAGNOSIS — I10 Essential (primary) hypertension: Secondary | ICD-10-CM

## 2015-05-28 DIAGNOSIS — I4892 Unspecified atrial flutter: Secondary | ICD-10-CM

## 2015-05-28 DIAGNOSIS — I5032 Chronic diastolic (congestive) heart failure: Secondary | ICD-10-CM

## 2015-05-28 DIAGNOSIS — Z01818 Encounter for other preprocedural examination: Secondary | ICD-10-CM

## 2015-05-28 DIAGNOSIS — D689 Coagulation defect, unspecified: Secondary | ICD-10-CM

## 2015-05-28 DIAGNOSIS — Z7901 Long term (current) use of anticoagulants: Secondary | ICD-10-CM

## 2015-05-28 NOTE — Patient Instructions (Addendum)
Your physician has recommended that you have a Cardioversion (DCCV) next Wednesday March 1 or Thursday March 2. Electrical Cardioversion uses a jolt of electricity to your heart either through paddles or wired patches attached to your chest. This is a controlled, usually prescheduled, procedure. Defibrillation is done under light anesthesia in the hospital, and you usually go home the day of the procedure. This is done to get your heart back into a normal rhythm. You are not awake for the procedure. Please see the instruction sheet given to you today.  You will need to have BLOOD WORK done 2 days prior to the procedure. ---- It can be done at any [facility] lab.  ---- There is a lab downstairs on the first floor of this building in [address] and one at [address].

## 2015-05-28 NOTE — Progress Notes (Signed)
Patient ID: Natasha Fletcher, female   DOB: 02/26/1926, 82 y.o.   MRN: [id number]    Date:  05/28/2015   ID:  Natasha Fletcher, DOB 1926-01-29, MRN [id number]  PCP:  Myriam Jacobson, MD  Primary Cardiologist:  Claiborne Billings   Chief Complaint  Patient presents with  . Follow-up    no chest pain, some shortness of breath, pain in legs, alot of shortness of breath, dizziness and lightheadedness climbing stairs     History of Present Illness: Natasha Fletcher is a 82 y.o. female with a h/o PAF, on chronic anticoagulation therapy with Coumadin, chronic diastolic CHF and HTN. Her INRs are followed in our coumadin clinic. In 2015, she required cardioversion which was successful. She was doing well for a period of time. Recently, in Dec 2016, she was admitted to [facility] with a UTI and went back into atrial fibrillation. Her rate control agents (cardizem and metoprolol) were increased, however, she developed profound bradycardia and hypotension. HR was in the 30s. Subsequently, her doses of Cardizem and metoprolol were reduced and she had no recurrent issues with bradycardia.   She presented to clinic 05/14/15 with complaints of sudden onset of dyspnea and tachy palpitations that started around 1:30 am earlier that day.   An EKG was obtained and she was noted to be in atrial fibrillation with a rate of 116 bpm. She was instructed to take an additional Cardizem tablet at PCP office (total of 240 mg today). When she arrived to our clinic, her HR and symptoms had improved. HR was in the upper 60s. She denied any further dyspnea, nor palpitations. No CP. BP was stable.   A basic metabolic panel on [id number] and her potassium was within normal limits as was her sodium her last 2 INRs were 4.4 and 2.9.   Her last 2-D echocardiogram was October 2016 and her EF was 55-60% the left atrium was moderately dilated.   Severe LVH.   Right atrium was mildly dilated.   Natasha Fletcher presents for elevation of her atrial flutter  /fibrillation. Her main issues at this point seem to be with her knees.  She has not driven in about 3-4 months because of her orthopedic issues.  She otherwise feels well and denies nausea, vomiting, fever, chest pain, shortness of breath, orthopnea, dizziness, PND, cough, congestion, abdominal pain, hematochezia, melena, lower extremity edema, claudication.  Wt Readings from Last 3 Encounters:  05/28/15 141 lb (63.957 kg)  05/14/15 146 lb (66.225 kg)  04/18/15 146 lb 9.6 oz (66.497 kg)     Past Medical History  Diagnosis Date  . Hypertension   . Asthma   . Glaucoma   . Incontinence   . Wears dentures   . Wears glasses   . Thyroid disease     "something years ago"  . GERD (gastroesophageal reflux disease)   . Cancer (HCC)     rt breast  . Edema leg   . Fast breathing     "because of pill"  . Pneumonia     h/o younger  . Arthritis     "all over"  . S/P total knee arthroplasty   . Glaucoma   . Swelling     bilateral feet/ legs, more left.  . Atrial fibrillation ([place])   . Diabetes mellitus without complication ([place])   . UTI (urinary tract infection) 02/12/2015    Current Outpatient Prescriptions  Medication Sig Dispense Refill  . anastrozole (ARIMIDEX) 1 MG tablet TAKE 1 TABLET BY  MOUTH EVERY DAY 30 tablet 2  . diltiazem (CARDIZEM CD) 120 MG 24 hr capsule Take 1 capsule (120 mg total) by mouth daily. 90 capsule 3  . feeding supplement (BOOST / RESOURCE BREEZE) LIQD Take 1 Container by mouth 3 (three) times daily between meals. 90 Container 0  . furosemide (LASIX) 20 MG tablet Take 40 mg by mouth 2 (two) times daily.     Marland Kitchen HYDROcodone-acetaminophen (NORCO) 10-325 MG tablet Take 1 tablet by mouth 2 (two) times daily as needed (leg pain).   0  . latanoprost (XALATAN) 0.005 % ophthalmic solution Place 1 drop into both eyes at bedtime.    . meclizine (ANTIVERT) 25 MG tablet Take 12.5 mg by mouth 3 (three) times daily.     . Menthol, Topical Analgesic, (ICE BLUE EX) Apply 1  application topically 2 (two) times daily as needed (joint pain).    . methenamine-sodium biphosphonate (UROQUID) 500-500 MG tablet Take 1 tablet by mouth 3 (three) times daily.    . metoprolol (LOPRESSOR) 100 MG tablet Take 1 tablet (100 mg total) by mouth 2 (two) times daily. 60 tablet 6  . traZODone (DESYREL) 50 MG tablet Take 50 mg by mouth at bedtime.  0  . warfarin (COUMADIN) 5 MG tablet Take 1 tablet by mouth daily or as directed by coumadin clinic 90 tablet 0   No current facility-administered medications for this visit.    Allergies:    Allergies  Allergen Reactions  . Lactose Intolerance (Gi) Other (See Comments)  . Milk-Related Compounds Other (See Comments)    Upset stomach   . Sulfa Antibiotics Other (See Comments)    Reaction many years ago-unknown    Social History:  The patient  reports that she quit smoking about 40 years ago. Her smoking use included Cigarettes. She smoked 0.00 packs per day for 30 years. She has never used smokeless tobacco. She reports that she drinks alcohol. She reports that she does not use illicit drugs.   Family history:   Family History  Problem Relation Age of Onset  . Heart disease Mother   . Stroke Father   . Heart attack Neg Hx   . Stroke Mother   . Stroke Brother   . Hypertension Mother   . Hypertension Father     ROS:  Please see the history of present illness.  All other systems reviewed and negative.   PHYSICAL EXAM: VS:  BP 130/80 mmHg  Pulse 69  Ht 5' 4.5" (1.638 m)  Wt 141 lb (63.957 kg)  BMI 23.84 kg/m2 Well nourished, well developed, in no acute distress HEENT: Pupils are equal round react to light accommodation extraocular movements are intact.  Neck: no JVDNo cervical lymphadenopathy. Cardiac: Regular rate and rhythm without murmurs rubs or gallops. Lungs:  clear to auscultation bilaterally, no wheezing, rhonchi or rales Abd: soft, nontender, positive bowel sounds all quadrants, no hepatosplenomegaly Ext: no  lower extremity edema.  2+ radial and dorsalis pedis pulses. Skin: warm and dry Neuro:  Grossly normal  EKG:   Atrial flutter with variable block. Rate 69 bpm left ventricular hypertrophy.    ASSESSMENT AND PLAN:  Problem List Items Addressed This Visit    Chronic diastolic CHF (congestive heart failure) (HCC)   Benign essential HTN   Atrial flutter ([place]) - Primary   Relevant Orders   EKG 12-Lead   ELECTRICAL CARDIOVERSION   Anticoagulated    Other Visit Diagnoses    Other fatigue  Relevant Orders    CBC    TSH    Pre-procedural examination        Relevant Orders    APTT    CBC    Basic metabolic panel    Protime-INR    TSH    Blood clotting disorder (HCC)        Relevant Orders    APTT    Protime-INR    Long term (current) use of anticoagulants        Relevant Orders    APTT    Protime-INR        She continues in atrial flutter with variable block.  She has been therapeutic with her INR for at least 3 weeks.   We'll schedule her for DC CV next week plan get labs on Monday which will include another PT/INR.  Suspected this does not stick she may need help with antiarrhythmic medication such as amiodarone. Clear blood pressures controlled. She appears euvolemic on exam.

## 2015-06-02 ENCOUNTER — Other Ambulatory Visit: Payer: Self-pay | Admitting: *Deleted

## 2015-06-02 DIAGNOSIS — C50119 Malignant neoplasm of central portion of unspecified female breast: Secondary | ICD-10-CM

## 2015-06-02 LAB — CBC
HCT: 38.7 % (ref 36.0–46.0)
Hemoglobin: 12.1 g/dL (ref 12.0–15.0)
MCH: 27.4 pg (ref 26.0–34.0)
MCHC: 31.3 g/dL (ref 30.0–36.0)
MCV: 87.6 fL (ref 78.0–100.0)
MPV: 10.7 fL (ref 8.6–12.4)
PLATELETS: 221 10*3/uL (ref 150–400)
RBC: 4.42 MIL/uL (ref 3.87–5.11)
RDW: 14.9 % (ref 11.5–15.5)
WBC: 4.8 10*3/uL (ref 4.0–10.5)

## 2015-06-02 LAB — APTT: APTT: 41 s — AB (ref 24–37)

## 2015-06-02 LAB — PROTIME-INR
INR: 2.46 — AB (ref <1.50)
Prothrombin Time: 27 seconds — ABNORMAL HIGH (ref 11.6–15.2)

## 2015-06-02 NOTE — Assessment & Plan Note (Signed)
Right breast lumpectomy for a 0.9 cm ER positive PR positive HER-2/neu negative invasive ductal carcinoma of the right breast on 04/27/2011   Current treatment: Arimidex 1 mg daily since February 2013 with the plan for 5 years Arimidex toxicities: No significant problems Breast cancer surveillance:  1. Mammogram 05/03/2013 is normal, patient will need another mammogram this year if she wishes to continue with surveillance 2. Breast exam 05/28/2014 is normal  Return to clinic in 1 year for follow-up 

## 2015-06-03 ENCOUNTER — Encounter: Payer: Self-pay | Admitting: Hematology and Oncology

## 2015-06-03 ENCOUNTER — Telehealth: Payer: Self-pay

## 2015-06-03 ENCOUNTER — Other Ambulatory Visit (HOSPITAL_BASED_OUTPATIENT_CLINIC_OR_DEPARTMENT_OTHER): Payer: Medicare Other

## 2015-06-03 ENCOUNTER — Ambulatory Visit (HOSPITAL_BASED_OUTPATIENT_CLINIC_OR_DEPARTMENT_OTHER): Payer: Medicare Other | Admitting: Hematology and Oncology

## 2015-06-03 VITALS — BP 91/76 | HR 147 | Temp 97.8°F | Resp 18 | Ht 64.5 in | Wt 139.5 lb

## 2015-06-03 DIAGNOSIS — C50211 Malignant neoplasm of upper-inner quadrant of right female breast: Secondary | ICD-10-CM

## 2015-06-03 DIAGNOSIS — Z853 Personal history of malignant neoplasm of breast: Secondary | ICD-10-CM

## 2015-06-03 DIAGNOSIS — Z79811 Long term (current) use of aromatase inhibitors: Secondary | ICD-10-CM

## 2015-06-03 DIAGNOSIS — C50119 Malignant neoplasm of central portion of unspecified female breast: Secondary | ICD-10-CM

## 2015-06-03 LAB — CBC WITH DIFFERENTIAL/PLATELET
BASO%: 0.9 % (ref 0.0–2.0)
Basophils Absolute: 0 10*3/uL (ref 0.0–0.1)
EOS%: 3.6 % (ref 0.0–7.0)
Eosinophils Absolute: 0.2 10*3/uL (ref 0.0–0.5)
HEMATOCRIT: 38.7 % (ref 34.8–46.6)
HGB: 12.3 g/dL (ref 11.6–15.9)
LYMPH#: 1 10*3/uL (ref 0.9–3.3)
LYMPH%: 18 % (ref 14.0–49.7)
MCH: 27.6 pg (ref 25.1–34.0)
MCHC: 31.8 g/dL (ref 31.5–36.0)
MCV: 86.9 fL (ref 79.5–101.0)
MONO#: 0.4 10*3/uL (ref 0.1–0.9)
MONO%: 6.7 % (ref 0.0–14.0)
NEUT%: 70.8 % (ref 38.4–76.8)
NEUTROS ABS: 3.8 10*3/uL (ref 1.5–6.5)
PLATELETS: 199 10*3/uL (ref 145–400)
RBC: 4.46 10*6/uL (ref 3.70–5.45)
RDW: 14.9 % — AB (ref 11.2–14.5)
WBC: 5.4 10*3/uL (ref 3.9–10.3)

## 2015-06-03 LAB — COMPREHENSIVE METABOLIC PANEL
ALT: 30 U/L (ref 0–55)
AST: 38 U/L — AB (ref 5–34)
Albumin: 3.6 g/dL (ref 3.5–5.0)
Alkaline Phosphatase: 100 U/L (ref 40–150)
Anion Gap: 11 mEq/L (ref 3–11)
BILIRUBIN TOTAL: 1.01 mg/dL (ref 0.20–1.20)
BUN: 29.1 mg/dL — AB (ref 7.0–26.0)
CALCIUM: 9.5 mg/dL (ref 8.4–10.4)
CHLORIDE: 105 meq/L (ref 98–109)
CO2: 24 meq/L (ref 22–29)
CREATININE: 1.2 mg/dL — AB (ref 0.6–1.1)
EGFR: 47 mL/min/{1.73_m2} — ABNORMAL LOW (ref >90)
Glucose: 97 mg/dl (ref 70–140)
Potassium: 3.9 mEq/L (ref 3.5–5.1)
Sodium: 140 mEq/L (ref 136–145)
TOTAL PROTEIN: 6.7 g/dL (ref 6.4–8.3)

## 2015-06-03 LAB — BASIC METABOLIC PANEL
BUN: 29 mg/dL — ABNORMAL HIGH (ref 7–25)
CHLORIDE: 102 mmol/L (ref 98–110)
CO2: 27 mmol/L (ref 20–31)
CREATININE: 1.27 mg/dL — AB (ref 0.60–0.88)
Calcium: 9.4 mg/dL (ref 8.6–10.4)
Glucose, Bld: 111 mg/dL — ABNORMAL HIGH (ref 65–99)
POTASSIUM: 3.9 mmol/L (ref 3.5–5.3)
SODIUM: 141 mmol/L (ref 135–146)

## 2015-06-03 LAB — TSH: TSH: 1.22 m[IU]/L

## 2015-06-03 NOTE — Progress Notes (Signed)
Patient Care Team: Lorene Dy, MD as PCP - General (Internal Medicine)  DIAGNOSIS: Cancer of central portion of female breast [facility])   Staging form: Breast, AJCC 7th Edition     Clinical: No stage assigned - Unsigned     Pathologic: Stage 0 (Tis, NX, cM0) - Unsigned  SUMMARY OF ONCOLOGIC HISTORY:   Breast cancer of upper-inner quadrant of right female breast ([place])   03/24/2011 Initial Diagnosis Right breast biopsy: Invasive ductal carcinoma, ER 100%, P 100%, Ki-67 8%, HER-2 negative ratio 1.42   04/27/2011 Surgery Right breast lumpectomy: 0.9 cm invasive ductal carcinoma grade 1 with low-grade DCIS ER/PR positive HER-2 negative   05/12/2011 -  Anti-estrogen oral therapy Arimidex 1 mg daily    CHIEF COMPLIANT: Follow-up on Arimidex.  INTERVAL HISTORY: Natasha Fletcher is a 82 year old with above-mentioned history of right breast cancer who underwent lumpectomy and is currently on adjuvant Arimidex therapy. She has been tolerating the Arimidex extremely well without any major problems or concerns. She denies any hot flashes or myalgias. We had kept this is an optional treatment given her advanced age. She wishes to continue and complete her 5 years of antiestrogen therapy which will be done by February 2018. She denies any lumps or nodules in the breast. And her last mammogram done in May 2016 was normal. Patient has had multiple arthritis symptoms and had required surgeries on her knees hips shoulder as well as her hand.  REVIEW OF SYSTEMS:   Constitutional: Denies fevers, chills or abnormal weight loss, requires a wheelchair for ambulation or long distances Eyes: Denies blurriness of vision Ears, nose, mouth, throat, and face: Denies mucositis or sore throat Respiratory: Denies cough, dyspnea or wheezes Cardiovascular: Denies palpitation, chest discomfort Gastrointestinal:  Denies nausea, heartburn or change in bowel habits Skin: Denies abnormal skin rashes Lymphatics: Denies new  lymphadenopathy or easy bruising Neurological:Denies numbness, tingling or new weaknesses Behavioral/Psych: Mood is stable, no new changes  Extremities: No lower extremity edema Breast:  denies any pain or lumps or nodules in either breasts All other systems were reviewed with the patient and are negative.  I have reviewed the past medical history, past surgical history, social history and family history with the patient and they are unchanged from previous note.  ALLERGIES:  is allergic to lactose intolerance (gi); milk-related compounds; and sulfa antibiotics.  MEDICATIONS:  Current Outpatient Prescriptions  Medication Sig Dispense Refill  . anastrozole (ARIMIDEX) 1 MG tablet TAKE 1 TABLET BY MOUTH EVERY DAY 30 tablet 2  . diltiazem (CARDIZEM CD) 120 MG 24 hr capsule Take 1 capsule (120 mg total) by mouth daily. 90 capsule 3  . feeding supplement (BOOST / RESOURCE BREEZE) LIQD Take 1 Container by mouth 3 (three) times daily between meals. 90 Container 0  . furosemide (LASIX) 20 MG tablet Take 40 mg by mouth 2 (two) times daily.     Marland Kitchen HYDROcodone-acetaminophen (NORCO) 10-325 MG tablet Take 1 tablet by mouth 2 (two) times daily as needed (leg pain).   0  . latanoprost (XALATAN) 0.005 % ophthalmic solution Place 1 drop into both eyes at bedtime.    . metoprolol (LOPRESSOR) 100 MG tablet Take 1 tablet (100 mg total) by mouth 2 (two) times daily. 60 tablet 6  . traZODone (DESYREL) 50 MG tablet Take 50 mg by mouth at bedtime.  0  . warfarin (COUMADIN) 5 MG tablet Take 1 tablet by mouth daily or as directed by coumadin clinic 90 tablet 0  . meclizine (ANTIVERT)  25 MG tablet Take 12.5 mg by mouth 3 (three) times daily. Reported on 06/03/2015    . Menthol, Topical Analgesic, (ICE BLUE EX) Apply 1 application topically 2 (two) times daily as needed (joint pain). Reported on 06/03/2015     No current facility-administered medications for this visit.    PHYSICAL EXAMINATION: ECOG PERFORMANCE  STATUS: 1 - Symptomatic but completely ambulatory  Filed Vitals:   06/03/15 0843  BP: 91/76  Pulse: 147  Temp: 97.8 F (36.6 C)  Resp: 18   Filed Weights   06/03/15 0843  Weight: 139 lb 8 oz (63.277 kg)    GENERAL:alert, no distress and comfortable SKIN: skin color, texture, turgor are normal, no rashes or significant lesions EYES: normal, Conjunctiva are pink and non-injected, sclera clear OROPHARYNX:no exudate, no erythema and lips, buccal mucosa, and tongue normal  NECK: supple, thyroid normal size, non-tender, without nodularity LYMPH:  no palpable lymphadenopathy in the cervical, axillary or inguinal LUNGS: clear to auscultation and percussion with normal breathing effort HEART: regular rate & rhythm and no murmurs and no lower extremity edema ABDOMEN:abdomen soft, non-tender and normal bowel sounds MUSCULOSKELETAL:no cyanosis of digits and no clubbing  NEURO: alert & oriented x 3 with fluent speech, no focal motor/sensory deficits EXTREMITIES: No lower extremity edema BREAST: No palpable masses or nodules in either right or left breasts. No palpable axillary supraclavicular or infraclavicular adenopathy no breast tenderness or nipple discharge. (exam performed in the presence of a chaperone)  LABORATORY DATA:  I have reviewed the data as listed   Chemistry      Component Value Date/Time   NA 141 06/02/2015 0955   NA 145 05/28/2014 0903   K 3.9 06/02/2015 0955   K 4.0 05/28/2014 0903   CL 102 06/02/2015 0955   CL 106 06/30/2012 1408   CO2 27 06/02/2015 0955   CO2 29 05/28/2014 0903   BUN 29* 06/02/2015 0955   BUN 20.4 05/28/2014 0903   CREATININE 1.27* 06/02/2015 0955   CREATININE 1.11* 03/19/2015 0512   CREATININE 1.0 05/28/2014 0903      Component Value Date/Time   CALCIUM 9.4 06/02/2015 0955   CALCIUM 9.5 05/28/2014 0903   ALKPHOS 93 02/16/2015 0920   ALKPHOS 113 05/28/2014 0903   AST 84* 02/16/2015 0920   AST 28 05/28/2014 0903   ALT 77* 02/16/2015 0920    ALT 24 05/28/2014 0903   BILITOT 1.4* 02/16/2015 0920   BILITOT 0.72 05/28/2014 0903      Lab Results  Component Value Date   WBC 5.4 06/03/2015   HGB 12.3 06/03/2015   HCT 38.7 06/03/2015   MCV 86.9 06/03/2015   PLT 199 06/03/2015   NEUTROABS 3.8 06/03/2015   ASSESSMENT & PLAN:  Breast cancer of upper-inner quadrant of right female breast Right breast lumpectomy for a 0.9 cm ER positive PR positive HER-2/neu negative invasive ductal carcinoma of the right breast on 04/27/2011   Current treatment: Arimidex 1 mg daily since February 2013 with the plan for 5 years  Arimidex toxicities: No significant problems. Patient completed 4 years of therapy. She denies any hot flashes or myalgias. I discussed with her that given her advanced age we could discontinue antiestrogen therapy if she wishes to. Patient will think about it and keep me in the loop what her decision is. At first she was not certain that she had breast surgery. I gave her copies of the operative note from Dr. Excell Seltzer.  Breast cancer surveillance:  1. Mammogram 09/03/2014 is  normal, patient will need another mammogram this year if she wishes to continue with surveillance. Breast density category C 2. Breast exam 06/03/2015 is normal  Return to clinic in 1 year for follow-up  No orders of the defined types were placed in this encounter.   The patient has a good understanding of the overall plan. she agrees with it. she will call with any problems that may develop before the next visit here.   Rulon Eisenmenger, MD 06/03/2015

## 2015-06-03 NOTE — Telephone Encounter (Signed)
Spoke with patient and she is aware of her follow up appt

## 2015-06-04 ENCOUNTER — Ambulatory Visit (HOSPITAL_COMMUNITY): Payer: Medicare Other | Admitting: Certified Registered"

## 2015-06-04 ENCOUNTER — Ambulatory Visit (HOSPITAL_COMMUNITY)
Admission: RE | Admit: 2015-06-04 | Discharge: 2015-06-04 | Disposition: A | Discharge status: Home / Self Care | Payer: Medicare Other | Source: Ambulatory Visit | Attending: Cardiology | Admitting: Cardiology

## 2015-06-04 ENCOUNTER — Encounter (HOSPITAL_COMMUNITY)
Admission: RE | Disposition: A | Discharge status: Home / Self Care | Payer: Self-pay | Source: Ambulatory Visit | Attending: Cardiology

## 2015-06-04 ENCOUNTER — Telehealth: Payer: Self-pay | Admitting: Cardiovascular Disease

## 2015-06-04 DIAGNOSIS — K219 Gastro-esophageal reflux disease without esophagitis: Secondary | ICD-10-CM | POA: Diagnosis not present

## 2015-06-04 DIAGNOSIS — I4892 Unspecified atrial flutter: Secondary | ICD-10-CM | POA: Insufficient documentation

## 2015-06-04 DIAGNOSIS — I4891 Unspecified atrial fibrillation: Secondary | ICD-10-CM | POA: Diagnosis not present

## 2015-06-04 DIAGNOSIS — I5032 Chronic diastolic (congestive) heart failure: Secondary | ICD-10-CM | POA: Diagnosis not present

## 2015-06-04 DIAGNOSIS — I480 Paroxysmal atrial fibrillation: Secondary | ICD-10-CM | POA: Insufficient documentation

## 2015-06-04 DIAGNOSIS — M159 Polyosteoarthritis, unspecified: Secondary | ICD-10-CM | POA: Insufficient documentation

## 2015-06-04 DIAGNOSIS — Z87891 Personal history of nicotine dependence: Secondary | ICD-10-CM | POA: Diagnosis not present

## 2015-06-04 DIAGNOSIS — Z79899 Other long term (current) drug therapy: Secondary | ICD-10-CM | POA: Diagnosis not present

## 2015-06-04 DIAGNOSIS — Z853 Personal history of malignant neoplasm of breast: Secondary | ICD-10-CM | POA: Diagnosis not present

## 2015-06-04 DIAGNOSIS — Z96659 Presence of unspecified artificial knee joint: Secondary | ICD-10-CM | POA: Diagnosis not present

## 2015-06-04 DIAGNOSIS — Z7901 Long term (current) use of anticoagulants: Secondary | ICD-10-CM | POA: Diagnosis not present

## 2015-06-04 DIAGNOSIS — I110 Hypertensive heart disease with heart failure: Secondary | ICD-10-CM | POA: Diagnosis not present

## 2015-06-04 DIAGNOSIS — E119 Type 2 diabetes mellitus without complications: Secondary | ICD-10-CM | POA: Diagnosis not present

## 2015-06-04 DIAGNOSIS — J45909 Unspecified asthma, uncomplicated: Secondary | ICD-10-CM | POA: Insufficient documentation

## 2015-06-04 HISTORY — PX: CARDIOVERSION: SHX1299

## 2015-06-04 SURGERY — CARDIOVERSION
Anesthesia: Monitor Anesthesia Care

## 2015-06-04 MED ORDER — LIDOCAINE HCL (CARDIAC) 20 MG/ML IV SOLN
INTRAVENOUS | Status: DC | PRN
  Administered 2015-06-04: 40 mg via INTRAVENOUS

## 2015-06-04 MED ORDER — SODIUM CHLORIDE 0.9 % IV SOLN
INTRAVENOUS | Status: DC | PRN
  Administered 2015-06-04: 14:00:00 via INTRAVENOUS

## 2015-06-04 MED ORDER — PROPOFOL 10 MG/ML IV BOLUS
INTRAVENOUS | Status: DC | PRN
Start: 2015-06-04 — End: 2015-06-04
  Administered 2015-06-04: 70 mg via INTRAVENOUS

## 2015-06-04 NOTE — Telephone Encounter (Signed)
Any problem w/ patient taking her prescribed Norco today? She has cardioversion this afternoon.

## 2015-06-04 NOTE — Telephone Encounter (Signed)
Would advise to not take before the procedure.  Can take as soon as she gets back home

## 2015-06-04 NOTE — Telephone Encounter (Signed)
Patient returned my call. Advice communicated, she verbalized understanding. Understands rationale for not using pain med due to potential conflict/combined potency w/ meds which would be given before cardioversion.

## 2015-06-04 NOTE — Discharge Instructions (Signed)
Electrical Cardioversion, Care After °Refer to this sheet in the next few weeks. These instructions provide you with information on caring for yourself after your procedure. Your health care provider may also give you more specific instructions. Your treatment has been planned according to current medical practices, but problems sometimes occur. Call your health care provider if you have any problems or questions after your procedure. °WHAT TO EXPECT AFTER THE PROCEDURE °After your procedure, it is typical to have the following sensations: °· Some redness on the skin where the shocks were delivered. If this is tender, a sunburn lotion or hydrocortisone cream may help. °· Possible return of an abnormal heart rhythm within hours or days after the procedure. °HOME CARE INSTRUCTIONS °· Take medicines only as directed by your health care provider. Be sure you understand how and when to take your medicine. °· Learn how to feel your pulse and check it often. °· Limit your activity for 48 hours after the procedure or as directed by your health care provider. °· Avoid or minimize caffeine and other stimulants as directed by your health care provider. °SEEK MEDICAL CARE IF: °· You feel like your heart is beating too fast or your pulse is not regular. °· You have any questions about your medicines. °· You have bleeding that will not stop. °SEEK IMMEDIATE MEDICAL CARE IF: °· You are dizzy or feel faint. °· It is hard to breathe or you feel short of breath. °· There is a change in discomfort in your chest. °· Your speech is slurred or you have trouble moving an arm or leg on one side of your body. °· You get a serious muscle cramp that does not go away. °· Your fingers or toes turn cold or blue. °  °This information is not intended to replace advice given to you by your health care provider. Make sure you discuss any questions you have with your health care provider. °  °Document Released: 01/10/2013 Document Revised: 04/12/2014  Document Reviewed: 01/10/2013 °Elsevier Interactive Patient Education ©2016 [employer] ° °

## 2015-06-04 NOTE — Transfer of Care (Signed)
Immediate Anesthesia Transfer of Care Note  Patient: Natasha Fletcher  Procedure(s) Performed: Procedure(s): CARDIOVERSION (N/A)  Patient Location: Endoscopy Unit  Anesthesia Type:MAC  Level of Consciousness: awake, alert , oriented and patient cooperative  Airway & Oxygen Therapy: Patient Spontanous Breathing and Patient connected to nasal cannula oxygen  Post-op Assessment: Report given to RN, Post -op Vital signs reviewed and stable and Patient moving all extremities  Post vital signs: Reviewed and stable  Last Vitals:  Filed Vitals:   06/04/15 1254  BP: 129/86  Pulse: 98  Resp: 21    Complications: No apparent anesthesia complications

## 2015-06-04 NOTE — Telephone Encounter (Signed)
Pt would like to take Hydrocodone for pain in her knees-hurting to walk-she has a Cadrioversion this pm at 200p and wants to make sure it's ok to take--pls call

## 2015-06-04 NOTE — Interval H&P Note (Signed)
History and Physical Interval Note:  06/04/2015 2:26 PM  Natasha Fletcher  has presented today for surgery, with the diagnosis of AFIB  The various methods of treatment have been discussed with the patient and family. After consideration of risks, benefits and other options for treatment, the patient has consented to  Procedure(s): CARDIOVERSION (N/A) as a surgical intervention .  The patient's history has been reviewed, patient examined, no change in status, stable for surgery.  I have reviewed the patient's chart and labs.  Questions were answered to the patient's satisfaction.     Dorothy Spark

## 2015-06-04 NOTE — Telephone Encounter (Signed)
Left message to call.

## 2015-06-04 NOTE — Anesthesia Postprocedure Evaluation (Signed)
Anesthesia Post Note  Patient: Guenevere B Nied  Procedure(s) Performed: Procedure(s) (LRB): CARDIOVERSION (N/A)  Patient location during evaluation: PACU Anesthesia Type: MAC Level of consciousness: awake and alert Pain management: pain level controlled Vital Signs Assessment: post-procedure vital signs reviewed and stable Respiratory status: spontaneous breathing, nonlabored ventilation, respiratory function stable and patient connected to nasal cannula oxygen Cardiovascular status: stable and blood pressure returned to baseline Anesthetic complications: no    Last Vitals:  Filed Vitals:   06/04/15 1449 06/04/15 1454  BP: 140/62 135/68  Pulse: 56 53  Temp:  36.6 C  Resp: 28 17    Last Pain:  Filed Vitals:   06/04/15 1454  PainSc: [place]

## 2015-06-04 NOTE — Anesthesia Preprocedure Evaluation (Addendum)
Anesthesia Evaluation  Patient identified by MRN, date of birth, ID band Patient awake    Reviewed: Allergy & Precautions, NPO status , Patient's Chart, lab work & pertinent test results  Airway Mallampati: II   Neck ROM: full    Dental   Pulmonary asthma , former smoker,    breath sounds clear to auscultation       Cardiovascular hypertension, +CHF  + dysrhythmias Atrial Fibrillation  Rhythm:irregular Rate:Normal     Neuro/Psych    GI/Hepatic GERD  ,  Endo/Other  diabetes, Type 2  Renal/GU      Musculoskeletal  (+) Arthritis ,   Abdominal   Peds  Hematology   Anesthesia Other Findings   Reproductive/Obstetrics                            Anesthesia Physical Anesthesia Plan  ASA: III  Anesthesia Plan: MAC   Post-op Pain Management:    Induction: Intravenous  Airway Management Planned: Mask  Additional Equipment:   Intra-op Plan:   Post-operative Plan:   Informed Consent: I have reviewed the patients History and Physical, chart, labs and discussed the procedure including the risks, benefits and alternatives for the proposed anesthesia with the patient or authorized representative who has indicated his/her understanding and acceptance.     Plan Discussed with: CRNA, Anesthesiologist and Surgeon  Anesthesia Plan Comments:         Anesthesia Quick Evaluation

## 2015-06-04 NOTE — H&P (View-Only) (Signed)
Patient ID: Natasha Fletcher, female   Natasha: [birth year], 82 y.o.   MRN: [id number]    Date:  05/28/2015   ID:  Natasha Fletcher, Natasha Fletcher, Natasha Fletcher, MRN [id number]  PCP:  Myriam Jacobson, MD  Primary Cardiologist:  Claiborne Billings   Chief Complaint  Patient presents with  . Follow-up    no chest pain, some shortness of breath, pain in legs, alot of shortness of breath, dizziness and lightheadedness climbing stairs     History of Present Illness: Natasha Fletcher is a 82 y.o. female with a h/o PAF, on chronic anticoagulation therapy with Coumadin, chronic diastolic CHF and HTN. Her INRs are followed in our coumadin clinic. In 2015, she required cardioversion which was successful. She was doing well for a period of time. Recently, in Dec 2016, she was admitted to [facility] with a UTI and went back into atrial fibrillation. Her rate control agents (cardizem and metoprolol) were increased, however, she developed profound bradycardia and hypotension. HR was in the 30s. Subsequently, her doses of Cardizem and metoprolol were reduced and she had no recurrent issues with bradycardia.   She presented to clinic 05/14/15 with complaints of sudden onset of dyspnea and tachy palpitations that started around 1:30 am earlier that day.   An EKG was obtained and she was noted to be in atrial fibrillation with a rate of 116 bpm. She was instructed to take an additional Cardizem tablet at PCP office (total of 240 mg today). When she arrived to our clinic, her HR and symptoms had improved. HR was in the upper 60s. She denied any further dyspnea, nor palpitations. No CP. BP was stable.   A basic metabolic panel on [id number] and her potassium was within normal limits as was her sodium her last 2 INRs were 4.4 and 2.9.   Her last 2-D echocardiogram was October 2016 and her EF was 55-60% the left atrium was moderately dilated.   Severe LVH.   Right atrium was mildly dilated.   Natasha Fletcher presents for elevation of her atrial flutter  /fibrillation. Her main issues at this point seem to be with her knees.  She has not driven in about 3-4 months because of her orthopedic issues.  She otherwise feels well and denies nausea, vomiting, fever, chest pain, shortness of breath, orthopnea, dizziness, PND, cough, congestion, abdominal pain, hematochezia, melena, lower extremity edema, claudication.  Wt Readings from Last 3 Encounters:  05/28/15 141 lb (63.957 kg)  05/14/15 146 lb (66.225 kg)  04/18/15 146 lb 9.6 oz (66.497 kg)     Past Medical History  Diagnosis Date  . Hypertension   . Asthma   . Glaucoma   . Incontinence   . Wears dentures   . Wears glasses   . Thyroid disease     "something years ago"  . GERD (gastroesophageal reflux disease)   . Cancer (HCC)     rt breast  . Edema leg   . Fast breathing     "because of pill"  . Pneumonia     h/o younger  . Arthritis     "all over"  . S/P total knee arthroplasty   . Glaucoma   . Swelling     bilateral feet/ legs, more left.  . Atrial fibrillation ([place])   . Diabetes mellitus without complication ([place])   . UTI (urinary tract infection) 02/12/2015    Current Outpatient Prescriptions  Medication Sig Dispense Refill  . anastrozole (ARIMIDEX) 1 MG tablet TAKE 1 TABLET BY  MOUTH EVERY DAY 30 tablet 2  . diltiazem (CARDIZEM CD) 120 MG 24 hr capsule Take 1 capsule (120 mg total) by mouth daily. 90 capsule 3  . feeding supplement (BOOST / RESOURCE BREEZE) LIQD Take 1 Container by mouth 3 (three) times daily between meals. 90 Container 0  . furosemide (LASIX) 20 MG tablet Take 40 mg by mouth 2 (two) times daily.     Marland Kitchen HYDROcodone-acetaminophen (NORCO) 10-325 MG tablet Take 1 tablet by mouth 2 (two) times daily as needed (leg pain).   0  . latanoprost (XALATAN) 0.005 % ophthalmic solution Place 1 drop into both eyes at bedtime.    . meclizine (ANTIVERT) 25 MG tablet Take 12.5 mg by mouth 3 (three) times daily.     . Menthol, Topical Analgesic, (ICE BLUE EX) Apply 1  application topically 2 (two) times daily as needed (joint pain).    . methenamine-sodium biphosphonate (UROQUID) 500-500 MG tablet Take 1 tablet by mouth 3 (three) times daily.    . metoprolol (LOPRESSOR) 100 MG tablet Take 1 tablet (100 mg total) by mouth 2 (two) times daily. 60 tablet 6  . traZODone (DESYREL) 50 MG tablet Take 50 mg by mouth at bedtime.  0  . warfarin (COUMADIN) 5 MG tablet Take 1 tablet by mouth daily or as directed by coumadin clinic 90 tablet 0   No current facility-administered medications for this visit.    Allergies:    Allergies  Allergen Reactions  . Lactose Intolerance (Gi) Other (See Comments)  . Milk-Related Compounds Other (See Comments)    Upset stomach   . Sulfa Antibiotics Other (See Comments)    Reaction many years ago-unknown    Social History:  The patient  reports that she quit smoking about 40 years ago. Her smoking use included Cigarettes. She smoked 0.00 packs per day for 30 years. She has never used smokeless tobacco. She reports that she drinks alcohol. She reports that she does not use illicit drugs.   Family history:   Family History  Problem Relation Age of Onset  . Heart disease Mother   . Stroke Father   . Heart attack Neg Hx   . Stroke Mother   . Stroke Brother   . Hypertension Mother   . Hypertension Father     ROS:  Please see the history of present illness.  All other systems reviewed and negative.   PHYSICAL EXAM: VS:  BP 130/80 mmHg  Pulse 69  Ht 5' 4.5" (1.638 m)  Wt 141 lb (63.957 kg)  BMI 23.84 kg/m2 Well nourished, well developed, in no acute distress HEENT: Pupils are equal round react to light accommodation extraocular movements are intact.  Neck: no JVDNo cervical lymphadenopathy. Cardiac: Regular rate and rhythm without murmurs rubs or gallops. Lungs:  clear to auscultation bilaterally, no wheezing, rhonchi or rales Abd: soft, nontender, positive bowel sounds all quadrants, no hepatosplenomegaly Ext: no  lower extremity edema.  2+ radial and dorsalis pedis pulses. Skin: warm and dry Neuro:  Grossly normal  EKG:   Atrial flutter with variable block. Rate 69 bpm left ventricular hypertrophy.    ASSESSMENT AND PLAN:  Problem List Items Addressed This Visit    Chronic diastolic CHF (congestive heart failure) (HCC)   Benign essential HTN   Atrial flutter ([place]) - Primary   Relevant Orders   EKG 12-Lead   ELECTRICAL CARDIOVERSION   Anticoagulated    Other Visit Diagnoses    Other fatigue  Relevant Orders    CBC    TSH    Pre-procedural examination        Relevant Orders    APTT    CBC    Basic metabolic panel    Protime-INR    TSH    Blood clotting disorder (HCC)        Relevant Orders    APTT    Protime-INR    Long term (current) use of anticoagulants        Relevant Orders    APTT    Protime-INR        She continues in atrial flutter with variable block.  She has been therapeutic with her INR for at least 3 weeks.   We'll schedule her for DC CV next week plan get labs on Monday which will include another PT/INR.  Suspected this does not stick she may need help with antiarrhythmic medication such as amiodarone. Clear blood pressures controlled. She appears euvolemic on exam.

## 2015-06-04 NOTE — CV Procedure (Signed)
   Cardioversion Note  Palmer Crincoli Aten [id number] 30-Jun-1925  Procedure: DC Cardioversion Indications: atrial fibrillation  Procedure Details Consent: Obtained Time Out: Verified patient identification, verified procedure, site/side was marked, verified correct patient position, special equipment/implants available, Radiology Safety Procedures followed,  medications/allergies/relevent history reviewed, required imaging and test results available.  Performed  The patient has been on adequate anticoagulation.  The patient received IV propofol and lidocaine administered by anesthesia staff for sedation.  Synchronous cardioversion was performed at 120 joules.  The cardioversion was successful.   Complications: No apparent complications Patient did tolerate procedure well.   Dorothy Spark, MD, [facility] 06/04/2015, 2:52 PM

## 2015-06-05 ENCOUNTER — Encounter (HOSPITAL_COMMUNITY): Payer: Self-pay | Admitting: Cardiology

## 2015-06-19 ENCOUNTER — Encounter: Payer: Self-pay | Admitting: Cardiovascular Disease

## 2015-06-19 ENCOUNTER — Ambulatory Visit (INDEPENDENT_AMBULATORY_CARE_PROVIDER_SITE_OTHER): Payer: Medicare Other | Admitting: Pharmacist Clinician (PhC)/ Clinical Pharmacy Specialist

## 2015-06-19 ENCOUNTER — Ambulatory Visit (INDEPENDENT_AMBULATORY_CARE_PROVIDER_SITE_OTHER): Payer: Medicare Other | Admitting: Cardiovascular Disease

## 2015-06-19 VITALS — BP 140/78 | HR 68 | Ht 64.0 in | Wt 136.5 lb

## 2015-06-19 DIAGNOSIS — I480 Paroxysmal atrial fibrillation: Secondary | ICD-10-CM | POA: Diagnosis not present

## 2015-06-19 DIAGNOSIS — I4892 Unspecified atrial flutter: Secondary | ICD-10-CM

## 2015-06-19 DIAGNOSIS — Z7901 Long term (current) use of anticoagulants: Secondary | ICD-10-CM | POA: Diagnosis not present

## 2015-06-19 DIAGNOSIS — I10 Essential (primary) hypertension: Secondary | ICD-10-CM

## 2015-06-19 DIAGNOSIS — I119 Hypertensive heart disease without heart failure: Secondary | ICD-10-CM

## 2015-06-19 LAB — POCT INR: INR: 2.1

## 2015-06-19 NOTE — Progress Notes (Signed)
Patient ID: Natasha Fletcher, female   DOB: 28-Aug-1925, 82 y.o.   MRN: [id number]     HPI: Natasha Fletcher is a 82 y.o. female who presents to the office today for a follow-upcardiology evaluation.    Ms. Gerstel is status post right total knee replacement in July 2015 and developed postoperative anemia, urinary retention and dehydration.  She underwent red cell transfusion.  In August 2015, she developed acute renal failure was omitted to the emergency room with a creatinine of 7.2, potassium 6.2.  She was hydrated, her ACE inhibitor inhibitor was discontinued and a Foley was placed.  She subsequently developed an episode of presyncope and was found to be in atrial fibrillation with RVR leading to her readmission.  Several days later.  Cardiology consultation was obtained in the hospital on 11/11/2013.  An echo revealed ejection fraction of 65-70% with grade 2 diastolic dysfunction and there was evidence for an intracavity gradient of 16 mm.  She was on anticoagulation and ultimately underwent cardioversion in October 2015.  She was in normal sinus rhythm.  On subsequent follow-up in November 2015.  She saw Kerin Ransom in March 2016.  I had last seen her in October 2016.  At that time  Her ECG showed atrial flutter with 41 conduction.  I recommended an echo Doppler study which showed an EF of 55-60% and evidence for severe LVH.  There was moderate left atrial and mild biatrial dilatation.  I was to see her back in follow-up but I have not seen her since. Since I saw her in December 2016 she was admitted to [facility] hospital with a UTI and was in atrial fibrillation. Cardizem and metoprolol were increased, although she developed significant bradycardia and hypotension leading to subsequent dose reduction. In early February.  She was found to be in atrial fibrillation with rapid ventricular response.  Since her hospitalization, she has been  Seen by several AP PEs. She also really was referred for a cardioversion for  atrial fibrillation which was done on 06/04/2015 by Dr. Meda Coffee. She presents now for follow-up evaluation.   She has been on a regimen consisting of Coumadin follow INR, metoprolol, tartrate 100 mg twice a day, furosemide 40 mg twice a day, and Cardizem 120 mg. She is unaware of any abnormality.  2.  Her heart rhythm.  She denies chest pain.  She has noticed some mild fatigue, particularly with walking.  She denies chest pressure. She presents for follow-up evaluation.  Past Medical History  Diagnosis Date  . Hypertension   . Asthma   . Glaucoma   . Incontinence   . Wears dentures   . Wears glasses   . Thyroid disease     "something years ago"  . GERD (gastroesophageal reflux disease)   . Cancer (HCC)     rt breast  . Edema leg   . Fast breathing     "because of pill"  . Pneumonia     h/o younger  . Arthritis     "all over"  . S/P total knee arthroplasty   . Glaucoma   . Swelling     bilateral feet/ legs, more left.  . Atrial fibrillation ([place])   . Diabetes mellitus without complication ([place])   . UTI (urinary tract infection) 02/12/2015    Past Surgical History  Procedure Laterality Date  . Total hip arthroplasty Bilateral   . Shoulder surgery Left   . Appendectomy    . Carpal tunnel release Bilateral   .  Tonsillectomy    . Colonoscopy    . Abdominal hysterectomy    . Eye surgery      both cataracts  . Breast biopsy  04/27/2011    Procedure: BREAST BIOPSY WITH NEEDLE LOCALIZATION;  Surgeon: Edward Jolly, MD;  Location: [place];  Service: General;  Laterality: Right;  right needle localized breast lumpectomy   . Breast lumpectomy  04/27/11    right breast by [facility]worth  . Total knee arthroplasty Right 02/19/2013    Procedure: RIGHT TOTAL KNEE ARTHROPLASTY;  Surgeon: Gearlean Alf, MD;  Location: [facility];  Service: Orthopedics;  Laterality: Right;  . Total knee arthroplasty Left 10/15/2013    Procedure: LEFT TOTAL KNEE ARTHROPLASTY;  Surgeon:  Gearlean Alf, MD;  Location: [facility];  Service: Orthopedics;  Laterality: Left;  . Cardioversion N/A 02/01/2014    Procedure: CARDIOVERSION;  Surgeon: Candee Furbish, MD;  Location: [facility] ENDOSCOPY;  Service: Cardiovascular;  Laterality: N/A;  . Tee without cardioversion N/A 02/01/2014    Procedure: TRANSESOPHAGEAL ECHOCARDIOGRAM (TEE);  Surgeon: Candee Furbish, MD;  Location: [facility] ENDOSCOPY;  Service: Cardiovascular;  Laterality: N/A;  . Cardioversion N/A 06/04/2015    Procedure: CARDIOVERSION;  Surgeon: Dorothy Spark, MD;  Location: [facility] ENDOSCOPY;  Service: Cardiovascular;  Laterality: N/A;    Allergies  Allergen Reactions  . Lactose Intolerance (Gi) Other (See Comments)  . Milk-Related Compounds Other (See Comments)    Upset stomach   . Sulfa Antibiotics Other (See Comments)    Reaction many years ago-unknown    Current Outpatient Prescriptions  Medication Sig Dispense Refill  . anastrozole (ARIMIDEX) 1 MG tablet TAKE 1 TABLET BY MOUTH EVERY DAY 30 tablet 2  . diltiazem (CARDIZEM CD) 120 MG 24 hr capsule Take 1 capsule (120 mg total) by mouth daily. 90 capsule 3  . feeding supplement (BOOST / RESOURCE BREEZE) LIQD Take 1 Container by mouth 3 (three) times daily between meals. 90 Container 0  . furosemide (LASIX) 20 MG tablet Take 40 mg by mouth 2 (two) times daily.     Marland Kitchen HYDROcodone-acetaminophen (NORCO) 10-325 MG tablet Take 1 tablet by mouth 2 (two) times daily as needed (leg pain).   0  . latanoprost (XALATAN) 0.005 % ophthalmic solution Place 1 drop into both eyes at bedtime.    . meclizine (ANTIVERT) 25 MG tablet Take 12.5 mg by mouth 3 (three) times daily. Reported on 06/03/2015    . Menthol, Topical Analgesic, (ICE BLUE EX) Apply 1 application topically 2 (two) times daily as needed (joint pain). Reported on 06/03/2015    . metoprolol (LOPRESSOR) 100 MG tablet Take 1 tablet (100 mg total) by mouth 2 (two) times daily. 60 tablet 6  . traZODone (DESYREL) 50 MG tablet Take 50 mg by mouth  at bedtime.  0  . warfarin (COUMADIN) 5 MG tablet Take 1 tablet by mouth daily or as directed by coumadin clinic 90 tablet 0   No current facility-administered medications for this visit.    Social History   Social History  . Marital Status: Single    Spouse Name: N/A  . Number of Children: N/A  . Years of Education: N/A   Occupational History  . Not on file.   Social History Main Topics  . Smoking status: Former Smoker -- 0.00 packs/day for 30 years    Types: Cigarettes    Quit date: 04/21/1975  . Smokeless tobacco: Never Used  . Alcohol Use: Yes     Comment: occasionally  .  Drug Use: No  . Sexual Activity: Not Currently   Other Topics Concern  . Not on file   Social History Narrative   From SNF, getting PT and using cane.      Family History  Problem Relation Age of Onset  . Heart disease Mother   . Stroke Father   . Heart attack Neg Hx   . Stroke Mother   . Stroke Brother   . Hypertension Mother   . Hypertension Father     ROS General: Negative; No fevers, chills, or night sweats HEENT: Negative; No changes in vision or hearing, sinus congestion, difficulty swallowing Pulmonary: Negative; No cough, wheezing, shortness of breath, hemoptysis Cardiovascular: See HPI:  GI: Negative; No nausea, vomiting, diarrhea, or abdominal pain GU: Negative; No dysuria, hematuria, or difficulty voiding Musculoskeletal: Negative; no myalgias, joint pain, or weakness Hematologic: Negative; no easy bruising, bleeding Endocrine: Negative; no heat/cold intolerance; no diabetes, Neuro: Negative; no changes in balance, headaches Skin: Negative; No rashes or skin lesions Psychiatric: Negative; No behavioral problems, depression Sleep: Negative; No snoring,  daytime sleepiness, hypersomnolence, bruxism, restless legs, hypnogognic hallucinations. Other comprehensive 14 point system review is negative   Physical Exam BP 140/78 mmHg  Pulse 68  Ht _0  (1.626 m)  Wt 136 lb 8 oz  (61.916 kg)  BMI 23.42 kg/m2   Repeat blood pressure by me 132/76.  Wt Readings from Last 3 Encounters:  06/19/15 136 lb 8 oz (61.916 kg)  06/04/15 139 lb (63.05 kg)  06/03/15 139 lb 8 oz (63.277 kg)   General: Alert, oriented, no distress.  Skin: normal turgor, no rashes, warm and dry HEENT: Normocephalic, atraumatic. Pupils equal round and reactive to light; sclera anicteric; extraocular muscles intact, No lid lag; Nose without nasal septal hypertrophy; Mouth/Parynx benign; Mallinpatti scale 3 Neck: No JVD, no carotid bruits; normal carotid upstroke Lungs: clear to ausculatation and percussion bilaterally; no wheezing or rales, normal inspiratory and expiratory effort Chest wall: without tenderness to palpitation Heart: Irregularly irregular rhythm with ventricular rate in the mid to upper 80s.  1/6 systolic murmur, No diastolic murmur, no rubs, gallops, thrills, or heaves Abdomen: soft, nontender; no hepatosplenomehaly, BS+; abdominal aorta nontender and not dilated by palpation. Back: no CVA tenderness Pulses: 2+  Musculoskeletal: full range of motion, normal strength, no joint deformities Extremities: Pulses 2+, mild lower extremity edema., Homan's sign negative  Neurologic: grossly nonfocal; Cranial nerves grossly wnl Psychologic: Normal mood and affect   ECG (independently read by me): Atrial flutter with a ventricular rate at 87 bpm with variable block.  Nonspecific ST-T changes.  My personal review of her March 2016 ECG shows sinus bradycardia at 58  LABS:  BMP Latest Ref Rng 06/03/2015 06/02/2015 05/16/2015  Glucose 70 - 140 mg/dl 97 111(H) 105(H)  BUN 7.0 - 26.0 mg/dL 29.1(H) 29(H) 23  Creatinine 0.6 - 1.1 mg/dL 1.2(H) 1.27(H) 1.13(H)  Sodium 136 - 145 mEq/L 140 141 141  Potassium 3.5 - 5.1 mEq/L 3.9 3.9 4.0  Chloride 98 - 110 mmol/L - 102 104  CO2 22 - 29 mEq/L _1 Calcium 8.4 - 10.4 mg/dL 9.5 9.4 9.0    Hepatic Function Latest Ref Rng 06/03/2015 02/16/2015  02/15/2015  Total Protein 6.4 - 8.3 g/dL 6.7 6.3(L) 6.1(L)  Albumin 3.5 - 5.0 g/dL 3.6 3.2(L) 3.1(L)  AST 5 - 34 U/L 38(H) 84(H) 115(H)  ALT 0 - 55 U/L 30 77(H) 78(H)  Alk Phosphatase 40 - 150 U/L 100 93 104  Total Bilirubin 0.20 - 1.20 mg/dL 1.01 1.4(H) 1.9(H)    CBC Latest Ref Rng 06/03/2015 06/02/2015 03/19/2015  WBC 3.9 - 10.3 10e3/uL 5.4 4.8 4.1  Hemoglobin 11.6 - 15.9 g/dL 12.3 12.1 11.2(L)  Hematocrit 34.8 - 46.6 % 38.7 38.7 35.5(L)  Platelets 145 - 400 10e3/uL 199 221 221   Lab Results  Component Value Date   MCV 86.9 06/03/2015   MCV 87.6 06/02/2015   MCV 84.3 03/19/2015    Lab Results  Component Value Date   TSH 1.22 06/02/2015    BNP No results found for: BNP  ProBNP    Component Value Date/Time   PROBNP 3143.0* 11/17/2013 0442    Lipid Panel  No results found for: CHOL, TRIG, HDL, CHOLHDL, VLDL, LDLCALC, LDLDIRECT   RADIOLOGY: No results found.    ASSESSMENT AND PLAN: Ms. Ludemann is an 82 year old African-American female who has a history of increased fatigability.  When I initially saw her in the office in October 2016 felt that she was in atrial flutter with 4-1 block.  She was on Coumadin anticoagulation.  An echo Doppler study confirmed normal systolic function with biatrial enlargement and evidence for severe left ventricular hypertrophy. LA volume was 39.6 mL's per meter squared.  She had normal pulmonary pressure at 29 mm. She had undergone an initial cardioversion in 2015 and developed significant bradycardia when Cardizem and metoprolol doses were increased in the past. She is now 2 weeks status post cardioversion on 06/04/2014.   She has continued to be on Coumadin anticoagulation.  Her ECG today confirms atrial flutter with probable variable block with an overall ventricular rate at 69 bpm.  I had a long discussion with both she and her son. I have recommended that she be referred for EP evaluation for consideration of potential candidacy for  Atrial  flutter ablation versus initiation of antiarrhythmic therapy.  Since her ventricular rate is controlled at 69 bpm, I will not add additional rate control medication presently.  I reviewed her recent laboratory from 2 weeks ago.  Prior to her cardioversion.  Her BUN was 29, creatinine 1.2.  AST was minimally increased at 38.  Estimated GFR was 47.  Her blood pressure today is controlled on her current dose of diltiazem, furosemide, and metoprolol.  She denies bleeding issues on warfarin.  Time spent 25 minutes  [place] Sine, MD, [facility]  06/19/2015 3:03 PM

## 2015-06-19 NOTE — Patient Instructions (Addendum)
You have been referred to one of the Electrophysiology physicians in our group.  Your physician recommends that you schedule a follow-up appointment with dr Claiborne Billings pending EP recommendations.

## 2015-06-23 NOTE — Progress Notes (Signed)
Electrophysiology Office Note   Date:  06/24/2015   ID:  Natasha Fletcher, DOB [birth year], MRN [id number]  PCP:  Myriam Jacobson, MD  Cardiologist:  Claiborne Billings Primary Electrophysiologist:  Nickolaos Brallier Meredith Leeds, MD    Chief Complaint  Patient presents with  . Advice Only  . Atrial Flutter     History of Present Illness: Natasha Fletcher is a 82 y.o. female who presents today for electrophysiology evaluation.   She was diagnosed with atrial fibrillation with RVR and 2015 when she was hospitalized. She was found to have an EF of 65-70% with grade 2 diastolic dysfunction and an intracavitary gradient of 16 mmHg. She was anticoagulated and was cardioverted in October 2015. In October 2016, she was found to be in atrial flutter with 4-1 conduction. She had an echocardiogram at that time showed an EF 55-60% and severe LVH. She had moderate biatrial dilation. She was admitted to [facility] December 2016 she was found to be in atrial fibrillation. She was referred for cardioversion on 06/04/15, and is subsequently presented back to her cardiologist clinic in atrial flutter with 4-1 conduction.   Today, she denies symptoms of palpitations, chest pain, shortness of breath, orthopnea, PND, lower extremity edema, claudication, dizziness, presyncope, syncope, bleeding, or neurologic sequela. The patient is tolerating medications without difficulties.  She does complain of some mild fatigue that she says is improved when she is in normal rhythm.  Past Medical History  Diagnosis Date  . Hypertension   . Asthma   . Glaucoma   . Incontinence   . Wears dentures   . Wears glasses   . Thyroid disease     "something years ago"  . GERD (gastroesophageal reflux disease)   . Cancer (HCC)     rt breast  . Edema leg   . Fast breathing     "because of pill"  . Pneumonia     h/o younger  . Arthritis     "all over"  . S/P total knee arthroplasty   . Glaucoma   . Swelling     bilateral feet/  legs, more left.  . Atrial fibrillation ([place])   . Diabetes mellitus without complication ([place])   . UTI (urinary tract infection) 02/12/2015   Past Surgical History  Procedure Laterality Date  . Total hip arthroplasty Bilateral   . Shoulder surgery Left   . Appendectomy    . Carpal tunnel release Bilateral   . Tonsillectomy    . Colonoscopy    . Abdominal hysterectomy    . Eye surgery      both cataracts  . Breast biopsy  04/27/2011    Procedure: BREAST BIOPSY WITH NEEDLE LOCALIZATION;  Surgeon: Edward Jolly, MD;  Location: [place];  Service: General;  Laterality: Right;  right needle localized breast lumpectomy   . Breast lumpectomy  04/27/11    right breast by [facility]  . Total knee arthroplasty Right 02/19/2013    Procedure: RIGHT TOTAL KNEE ARTHROPLASTY;  Surgeon: Gearlean Alf, MD;  Location: [facility];  Service: Orthopedics;  Laterality: Right;  . Total knee arthroplasty Left 10/15/2013    Procedure: LEFT TOTAL KNEE ARTHROPLASTY;  Surgeon: Gearlean Alf, MD;  Location: [facility];  Service: Orthopedics;  Laterality: Left;  . Cardioversion N/A 02/01/2014    Procedure: CARDIOVERSION;  Surgeon: Candee Furbish, MD;  Location: [facility] ENDOSCOPY;  Service: Cardiovascular;  Laterality: N/A;  . Tee without cardioversion N/A 02/01/2014    Procedure: TRANSESOPHAGEAL  ECHOCARDIOGRAM (TEE);  Surgeon: Candee Furbish, MD;  Location: [facility] ENDOSCOPY;  Service: Cardiovascular;  Laterality: N/A;  . Cardioversion N/A 06/04/2015    Procedure: CARDIOVERSION;  Surgeon: Dorothy Spark, MD;  Location: [facility]OSCOPY;  Service: Cardiovascular;  Laterality: N/A;     Current Outpatient Prescriptions  Medication Sig Dispense Refill  . anastrozole (ARIMIDEX) 1 MG tablet TAKE 1 TABLET BY MOUTH EVERY DAY 30 tablet 2  . diltiazem (CARDIZEM CD) 120 MG 24 hr capsule Take 1 capsule (120 mg total) by mouth daily. 90 capsule 3  . feeding supplement (BOOST / RESOURCE BREEZE) LIQD Take 1 Container by mouth 3  (three) times daily between meals. 90 Container 0  . furosemide (LASIX) 20 MG tablet Take 40 mg by mouth 2 (two) times daily.     Marland Kitchen HYDROcodone-acetaminophen (NORCO) 10-325 MG tablet Take 1 tablet by mouth 2 (two) times daily as needed (leg pain).   0  . latanoprost (XALATAN) 0.005 % ophthalmic solution Place 1 drop into both eyes at bedtime.    . meclizine (ANTIVERT) 25 MG tablet Take 12.5 mg by mouth 3 (three) times daily. Reported on 06/03/2015    . Menthol, Topical Analgesic, (ICE BLUE EX) Apply 1 application topically 2 (two) times daily as needed (joint pain). Reported on 06/03/2015    . metoprolol (LOPRESSOR) 100 MG tablet Take 1 tablet (100 mg total) by mouth 2 (two) times daily. 60 tablet 6  . traZODone (DESYREL) 50 MG tablet Take 50 mg by mouth at bedtime.  0  . warfarin (COUMADIN) 5 MG tablet Take 1 tablet by mouth daily or as directed by coumadin clinic 90 tablet 0   No current facility-administered medications for this visit.    Allergies:   Lactose intolerance (gi); Milk-related compounds; and Sulfa antibiotics   Social History:  The patient  reports that she quit smoking about 40 years ago. Her smoking use included Cigarettes. She smoked 0.00 packs per day for 30 years. She has never used smokeless tobacco. She reports that she drinks alcohol. She reports that she does not use illicit drugs.   Family History:  The patient's family history includes Heart disease in her mother; Hypertension in her father and mother; Stroke in her brother, father, and mother. There is no history of Heart attack.    ROS:  Please see the history of present illness.   Otherwise, review of systems is positive for leg swelling, shortness of breath, palpitations, balance problems, joint swelling.   All other systems are reviewed and negative.    PHYSICAL EXAM: VS:  BP 106/70 mmHg  Pulse 77  Ht 5\' 4"  (1.626 m)  Wt 137 lb 9.6 oz (62.415 kg)  BMI 23.61 kg/m2 , BMI Body mass index is 23.61 kg/(m^2). GEN:  Well nourished, well developed, in no acute distress HEENT: normal Neck: no JVD, carotid bruits, or masses Cardiac: RRR; no murmurs, rubs, or gallops,no edema  Respiratory:  clear to auscultation bilaterally, normal work of breathing GI: soft, nontender, nondistended, + BS MS: no deformity or atrophy Skin: warm and dry Neuro:  Strength and sensation are intact Psych: euthymic mood, full affect  EKG:  EKG is ordered today. The ekg ordered today shows atrial flutter, rate 77, LAFB   Recent Labs: 02/17/2015: Magnesium 1.7 06/02/2015: TSH 1.22 06/03/2015: ALT 30; BUN 29.1*; Creatinine 1.2*; HGB 12.3; Platelets 199; Potassium 3.9; Sodium 140    Lipid Panel  No results found for: CHOL, TRIG, HDL, CHOLHDL, VLDL, LDLCALC, LDLDIRECT   Wt  Readings from Last 3 Encounters:  06/24/15 137 lb 9.6 oz (62.415 kg)  06/19/15 136 lb 8 oz (61.916 kg)  06/04/15 139 lb (63.05 kg)      Other studies Reviewed: Additional studies/ records that were reviewed today include: TTE 01/17/15  Review of the above records today demonstrates:  - Left ventricle: The cavity size was mildly reduced. Wall  thickness was increased in a pattern of severe LVH. Systolic  function was normal. The estimated ejection fraction was in the  range of 55% to 60%. - Left atrium: The atrium was moderately dilated. - Right atrium: The atrium was mildly dilated. - Pericardium, extracardiac: A trivial pericardial effusion was  identified.    ASSESSMENT AND PLAN:  1.  Persistent Atrial fibrillation/flutter: CHADS2VASc of 5.  On coumadin.  Unfortunately if atrial flutter ablation was performed, she would likely go back in atrial fibrillation. She would be at very high risk for complications during atrial fibrillation ablation. It is therefore likely that she would benefit most from antiarrhythmic therapy. She has a history of renal failure, and therefore both sotalol and dofetilide are unlikely to be helpful. We'll  therefore start her on amiodarone today, with cardioversion in 2 weeks once amiodarone has been loaded. We'll see her back in clinic in 3 months to determine if she is still in sinus rhythm at that time. It is likely that this [place] be successful, as she does not have yet have a severely dilated left atrium.    Current medicines are reviewed at length with the patient today.   The patient does not have concerns regarding her medicines.  The following changes were made today:  amiodarone  Labs/ tests ordered today include:  No orders of the defined types were placed in this encounter.     Disposition:   FU with Coreyon Nicotra 3 months  Signed, Reid Nawrot Meredith Leeds, MD  06/24/2015 11:30 AM     [facility] [address] [place] [address] [phone] (office) [phone] (fax)

## 2015-06-24 ENCOUNTER — Telehealth: Payer: Self-pay | Admitting: Cardiology

## 2015-06-24 ENCOUNTER — Encounter: Payer: Self-pay | Admitting: Cardiology

## 2015-06-24 ENCOUNTER — Ambulatory Visit (INDEPENDENT_AMBULATORY_CARE_PROVIDER_SITE_OTHER): Payer: Medicare Other | Admitting: Cardiology

## 2015-06-24 VITALS — BP 106/70 | HR 77 | Ht 64.0 in | Wt 137.6 lb

## 2015-06-24 DIAGNOSIS — I4892 Unspecified atrial flutter: Secondary | ICD-10-CM | POA: Diagnosis not present

## 2015-06-24 DIAGNOSIS — Z01812 Encounter for preprocedural laboratory examination: Secondary | ICD-10-CM | POA: Diagnosis not present

## 2015-06-24 LAB — CBC WITH DIFFERENTIAL/PLATELET
BASOS ABS: 0 10*3/uL (ref 0.0–0.1)
BASOS PCT: 1 % (ref 0–1)
Eosinophils Absolute: 0.1 10*3/uL (ref 0.0–0.7)
Eosinophils Relative: 3 % (ref 0–5)
HEMATOCRIT: 37.2 % (ref 36.0–46.0)
HEMOGLOBIN: 11.8 g/dL — AB (ref 12.0–15.0)
LYMPHS PCT: 33 % (ref 12–46)
Lymphs Abs: 1.4 10*3/uL (ref 0.7–4.0)
MCH: 27.1 pg (ref 26.0–34.0)
MCHC: 31.7 g/dL (ref 30.0–36.0)
MCV: 85.3 fL (ref 78.0–100.0)
MONO ABS: 0.3 10*3/uL (ref 0.1–1.0)
MPV: 10.7 fL (ref 8.6–12.4)
Monocytes Relative: 6 % (ref 3–12)
NEUTROS ABS: 2.4 10*3/uL (ref 1.7–7.7)
Neutrophils Relative %: 57 % (ref 43–77)
Platelets: 257 10*3/uL (ref 150–400)
RBC: 4.36 MIL/uL (ref 3.87–5.11)
RDW: 13.9 % (ref 11.5–15.5)
WBC: 4.2 10*3/uL (ref 4.0–10.5)

## 2015-06-24 LAB — BASIC METABOLIC PANEL
BUN: 26 mg/dL — ABNORMAL HIGH (ref 7–25)
CALCIUM: 9.6 mg/dL (ref 8.6–10.4)
CO2: 28 mmol/L (ref 20–31)
Chloride: 103 mmol/L (ref 98–110)
Creat: 1.04 mg/dL — ABNORMAL HIGH (ref 0.60–0.88)
Glucose, Bld: 86 mg/dL (ref 65–99)
POTASSIUM: 4.1 mmol/L (ref 3.5–5.3)
SODIUM: 139 mmol/L (ref 135–146)

## 2015-06-24 MED ORDER — AMIODARONE HCL 200 MG PO TABS: 200.0000 mg | ORAL_TABLET | Freq: Every day | ORAL | Status: DC

## 2015-06-24 MED ORDER — AMIODARONE HCL 200 MG PO TABS: 200.0000 mg | ORAL_TABLET | Freq: Two times a day (BID) | ORAL | Status: DC

## 2015-06-24 NOTE — Telephone Encounter (Signed)
Informed pharmacy that physician and coumadin clinic aware patient is on both medications. Will follow her closely.

## 2015-06-24 NOTE — Patient Instructions (Addendum)
Medication Instructions:  Your physician has recommended you make the following change in your medication:  1) START Amiodarone  - take 200 mg twice a day for 2 weeks  - then reduce and take 200 mg daily  Labwork: Pre procedure labs today: BMET, CBCD and INR  Testing/Procedures: Your physician has recommended that you have a Cardioversion (DCCV). Electrical Cardioversion uses a jolt of electricity to your heart either through paddles or wired patches attached to your chest. This is a controlled, usually prescheduled, procedure. Defibrillation is done under light anesthesia in the hospital, and you usually go home the day of the procedure. This is done to get your heart back into a normal rhythm. You are not awake for the procedure.   Lawrie Tunks, RN will call you to arrange this testing.  Follow-Up: Your physician recommends that you schedule a follow-up appointment in: 3 months with Dr. Curt Bears.  If you need a refill on your cardiac medications before your next appointment, please call your pharmacy.  Thank you for choosing [facility]!!   Trinidad Curet, RN [phone]

## 2015-06-24 NOTE — Telephone Encounter (Signed)
New Message  Pt c/o medication issue: 1. Name of Medication: amiodarone (PACERONE) 200 MG tablet  4. What is your medication issue? This medication interacts with her Warfarin please call back to discuss

## 2015-06-25 ENCOUNTER — Telehealth: Payer: Self-pay | Admitting: Cardiology

## 2015-06-25 DIAGNOSIS — Z01812 Encounter for preprocedural laboratory examination: Secondary | ICD-10-CM

## 2015-06-25 DIAGNOSIS — I4892 Unspecified atrial flutter: Secondary | ICD-10-CM

## 2015-06-25 LAB — PROTIME-INR
INR: 2.08 — AB (ref <1.50)
PROTHROMBIN TIME: 23.7 s — AB (ref 11.6–15.2)

## 2015-06-25 NOTE — Telephone Encounter (Signed)
Explained/discussed reason for DCCV recommendation.  She verbalized understanding. She would like me to call her brother, [name], and review the same information with him  [phone]). Informed I would call and speak with him then call her back to determine date for procedure. She is agreeable to this.

## 2015-06-25 NOTE — Telephone Encounter (Signed)
New message  Pt called to discuss the procedure she is supposed to have in a few weeks, Per pt she has a lot of questions doesn't understand why she is need of it.  ( no further details) Please call

## 2015-06-26 NOTE — Telephone Encounter (Signed)
Called to discuss date for DCCV. Patient is agreeable to 4/10. She understands I will call her next week to review procedure instructions.

## 2015-07-01 ENCOUNTER — Other Ambulatory Visit: Payer: Self-pay | Admitting: Cardiology

## 2015-07-01 ENCOUNTER — Encounter: Payer: Self-pay | Admitting: *Deleted

## 2015-07-01 NOTE — Telephone Encounter (Signed)
Reviewed DCCV instructions with patient. Pre procedure labs scheduled for 4/7. Letter of instructions left at front desk for pt to pick up at pre procedure lab appt. Patient verbalized understanding and agreeable to plan.

## 2015-07-02 ENCOUNTER — Other Ambulatory Visit: Payer: Self-pay | Admitting: *Deleted

## 2015-07-03 ENCOUNTER — Other Ambulatory Visit: Payer: Self-pay | Admitting: Cardiology

## 2015-07-03 ENCOUNTER — Ambulatory Visit (INDEPENDENT_AMBULATORY_CARE_PROVIDER_SITE_OTHER): Payer: Medicare Other | Admitting: Pharmacist Clinician (PhC)/ Clinical Pharmacy Specialist

## 2015-07-03 DIAGNOSIS — Z7901 Long term (current) use of anticoagulants: Secondary | ICD-10-CM

## 2015-07-03 DIAGNOSIS — I480 Paroxysmal atrial fibrillation: Secondary | ICD-10-CM

## 2015-07-03 DIAGNOSIS — I4892 Unspecified atrial flutter: Secondary | ICD-10-CM

## 2015-07-03 LAB — CBC WITH DIFFERENTIAL/PLATELET
BASOS ABS: 0 10*3/uL (ref 0.0–0.1)
Basophils Relative: 1 % (ref 0–1)
EOS ABS: 0.2 10*3/uL (ref 0.0–0.7)
EOS PCT: 4 % (ref 0–5)
HEMATOCRIT: 38.6 % (ref 36.0–46.0)
Hemoglobin: 12.7 g/dL (ref 12.0–15.0)
LYMPHS ABS: 1.2 10*3/uL (ref 0.7–4.0)
Lymphocytes Relative: 25 % (ref 12–46)
MCH: 28.3 pg (ref 26.0–34.0)
MCHC: 32.9 g/dL (ref 30.0–36.0)
MCV: 86.2 fL (ref 78.0–100.0)
MONO ABS: 0.3 10*3/uL (ref 0.1–1.0)
MPV: 10.2 fL (ref 8.6–12.4)
Monocytes Relative: 7 % (ref 3–12)
Neutro Abs: 3 10*3/uL (ref 1.7–7.7)
Neutrophils Relative %: 63 % (ref 43–77)
PLATELETS: 264 10*3/uL (ref 150–400)
RBC: 4.48 MIL/uL (ref 3.87–5.11)
RDW: 13.9 % (ref 11.5–15.5)
WBC: 4.7 10*3/uL (ref 4.0–10.5)

## 2015-07-03 LAB — POCT INR: INR: 4.3

## 2015-07-04 LAB — BASIC METABOLIC PANEL
BUN: 23 mg/dL (ref 7–25)
CALCIUM: 10 mg/dL (ref 8.6–10.4)
CO2: 27 mmol/L (ref 20–31)
CREATININE: 1.02 mg/dL — AB (ref 0.60–0.88)
Chloride: 99 mmol/L (ref 98–110)
GLUCOSE: 80 mg/dL (ref 65–99)
Potassium: 4.1 mmol/L (ref 3.5–5.3)
SODIUM: 140 mmol/L (ref 135–146)

## 2015-07-04 LAB — PROTIME-INR
INR: 4.11 — ABNORMAL HIGH (ref <1.50)
Prothrombin Time: 40.5 seconds — ABNORMAL HIGH (ref 11.6–15.2)

## 2015-07-11 ENCOUNTER — Ambulatory Visit (INDEPENDENT_AMBULATORY_CARE_PROVIDER_SITE_OTHER): Payer: Medicare Other | Admitting: Pharmacist Clinician (PhC)/ Clinical Pharmacy Specialist

## 2015-07-11 ENCOUNTER — Encounter: Payer: Self-pay | Admitting: Internal Medicine

## 2015-07-11 ENCOUNTER — Other Ambulatory Visit: Payer: Medicare Other

## 2015-07-11 DIAGNOSIS — I480 Paroxysmal atrial fibrillation: Secondary | ICD-10-CM | POA: Diagnosis not present

## 2015-07-11 DIAGNOSIS — Z7901 Long term (current) use of anticoagulants: Secondary | ICD-10-CM | POA: Diagnosis not present

## 2015-07-11 DIAGNOSIS — I4892 Unspecified atrial flutter: Secondary | ICD-10-CM

## 2015-07-11 LAB — POCT INR: INR: 5.3

## 2015-07-14 ENCOUNTER — Ambulatory Visit (HOSPITAL_COMMUNITY): Payer: Medicare Other | Admitting: Anesthesiology

## 2015-07-14 ENCOUNTER — Encounter: Payer: Medicare Other | Admitting: Pharmacist Clinician (PhC)/ Clinical Pharmacy Specialist

## 2015-07-14 ENCOUNTER — Ambulatory Visit (HOSPITAL_COMMUNITY)
Admission: RE | Admit: 2015-07-14 | Discharge: 2015-07-14 | Disposition: A | Discharge status: Home / Self Care | Payer: Medicare Other | Source: Ambulatory Visit | Attending: Cardiovascular Disease | Admitting: Cardiovascular Disease

## 2015-07-14 ENCOUNTER — Encounter (HOSPITAL_COMMUNITY): Payer: Self-pay | Admitting: *Deleted

## 2015-07-14 ENCOUNTER — Encounter (HOSPITAL_COMMUNITY)
Admission: RE | Disposition: A | Discharge status: Home / Self Care | Payer: Self-pay | Source: Ambulatory Visit | Attending: Cardiovascular Disease

## 2015-07-14 DIAGNOSIS — Z7901 Long term (current) use of anticoagulants: Secondary | ICD-10-CM | POA: Diagnosis not present

## 2015-07-14 DIAGNOSIS — I4892 Unspecified atrial flutter: Secondary | ICD-10-CM | POA: Diagnosis not present

## 2015-07-14 DIAGNOSIS — Z87891 Personal history of nicotine dependence: Secondary | ICD-10-CM | POA: Insufficient documentation

## 2015-07-14 DIAGNOSIS — I10 Essential (primary) hypertension: Secondary | ICD-10-CM | POA: Insufficient documentation

## 2015-07-14 DIAGNOSIS — E119 Type 2 diabetes mellitus without complications: Secondary | ICD-10-CM | POA: Insufficient documentation

## 2015-07-14 DIAGNOSIS — I4891 Unspecified atrial fibrillation: Secondary | ICD-10-CM | POA: Diagnosis not present

## 2015-07-14 DIAGNOSIS — Z96643 Presence of artificial hip joint, bilateral: Secondary | ICD-10-CM | POA: Diagnosis not present

## 2015-07-14 DIAGNOSIS — Z853 Personal history of malignant neoplasm of breast: Secondary | ICD-10-CM | POA: Insufficient documentation

## 2015-07-14 DIAGNOSIS — I481 Persistent atrial fibrillation: Secondary | ICD-10-CM | POA: Diagnosis not present

## 2015-07-14 DIAGNOSIS — Z96653 Presence of artificial knee joint, bilateral: Secondary | ICD-10-CM | POA: Insufficient documentation

## 2015-07-14 HISTORY — PX: CARDIOVERSION: SHX1299

## 2015-07-14 SURGERY — CARDIOVERSION
Anesthesia: Monitor Anesthesia Care

## 2015-07-14 MED ORDER — METOPROLOL TARTRATE 100 MG PO TABS: 50.0000 mg | ORAL_TABLET | Freq: Two times a day (BID) | ORAL | Status: DC

## 2015-07-14 MED ORDER — SODIUM CHLORIDE 0.9 % IV SOLN
INTRAVENOUS | Status: DC
  Administered 2015-07-14: 500 mL via INTRAVENOUS

## 2015-07-14 MED ORDER — PROPOFOL 10 MG/ML IV BOLUS
INTRAVENOUS | Status: DC | PRN
  Administered 2015-07-14: 50 mg via INTRAVENOUS

## 2015-07-14 MED ORDER — LIDOCAINE HCL (CARDIAC) 20 MG/ML IV SOLN
INTRAVENOUS | Status: DC | PRN
  Administered 2015-07-14: 30 mg via INTRAVENOUS

## 2015-07-14 NOTE — Anesthesia Postprocedure Evaluation (Signed)
Anesthesia Post Note  Patient: Natasha Fletcher  Procedure(s) Performed: Procedure(s) (LRB): CARDIOVERSION (N/A)  Patient location during evaluation: PACU Anesthesia Type: MAC Level of consciousness: awake and alert Pain management: pain level controlled Vital Signs Assessment: post-procedure vital signs reviewed and stable Respiratory status: spontaneous breathing, nonlabored ventilation, respiratory function stable and patient connected to nasal cannula oxygen Cardiovascular status: stable and blood pressure returned to baseline Anesthetic complications: no    Last Vitals:  Filed Vitals:   07/14/15 1019 07/14/15 1020  BP: 172/79   Pulse: 51 61  Temp:    Resp: 17 16    Last Pain:  Filed Vitals:   07/14/15 0909  PainSc: 8                  Zenaida Deed

## 2015-07-14 NOTE — Transfer of Care (Signed)
Immediate Anesthesia Transfer of Care Note  Patient: Natasha Fletcher  Procedure(s) Performed: Procedure(s): CARDIOVERSION (N/A)  Patient Location: Endoscopy Unit  Anesthesia Type:MAC  Level of Consciousness: awake, alert  and patient cooperative  Airway & Oxygen Therapy: Patient Spontanous Breathing  Post-op Assessment: Report given to RN and Post -op Vital signs reviewed and stable  Post vital signs: Reviewed and stable  Last Vitals:  Filed Vitals:   07/14/15 1019 07/14/15 1020  BP: 172/79   Pulse: 51 61  Temp:    Resp: 17 16    Complications: No apparent anesthesia complications

## 2015-07-14 NOTE — CV Procedure (Signed)
    Cardioversion Note  Basha Harshman Khachatryan [id number] [birth year]  Procedure: DC Cardioversion Indications: atrial fib  Procedure Details Consent: Obtained Time Out: Verified patient identification, verified procedure, site/side was marked, verified correct patient position, special equipment/implants available, Radiology Safety Procedures followed,  medications/allergies/relevent history reviewed, required imaging and test results available.  Performed  The patient has been on adequate anticoagulation.  The patient received IV Lidocaine 30 mg followed by Proofol 50 mg  for sedation.  Synchronous cardioversion was performed at 120  joules.  The cardioversion was successful     Complications: No apparent complications Patient did tolerate procedure well.   Thayer Headings, Brooke Bonito., MD, [facility] 07/14/2015, 10:19 AM

## 2015-07-14 NOTE — Anesthesia Preprocedure Evaluation (Addendum)
Anesthesia Evaluation  Patient identified by MRN, date of birth, ID band Patient awake    Reviewed: Allergy & Precautions, NPO status , Patient's Chart, lab work & pertinent test results  Airway Mallampati: II   Neck ROM: full    Dental   Pulmonary asthma , former smoker,    breath sounds clear to auscultation       Cardiovascular hypertension, +CHF  + dysrhythmias Atrial Fibrillation  Rhythm:irregular Rate:Normal  EF preserved at 60%, severe LVH with diastolic dysfunction   Neuro/Psych    GI/Hepatic GERD  ,  Endo/Other  diabetes, Type 2  Renal/GU      Musculoskeletal  (+) Arthritis ,   Abdominal   Peds  Hematology   Anesthesia Other Findings   Reproductive/Obstetrics                            Anesthesia Physical  Anesthesia Plan  ASA: III  Anesthesia Plan: MAC   Post-op Pain Management:    Induction: Intravenous  Airway Management Planned: Mask  Additional Equipment:   Intra-op Plan:   Post-operative Plan:   Informed Consent: I have reviewed the patients History and Physical, chart, labs and discussed the procedure including the risks, benefits and alternatives for the proposed anesthesia with the patient or authorized representative who has indicated his/her understanding and acceptance.     Plan Discussed with: CRNA, Anesthesiologist and Surgeon  Anesthesia Plan Comments:         Anesthesia Quick Evaluation

## 2015-07-14 NOTE — Interval H&P Note (Signed)
History and Physical Interval Note:  07/14/2015 10:13 AM  Natasha Fletcher  has presented today for surgery, with the diagnosis of AFLUTTER  The various methods of treatment have been discussed with the patient and family. After consideration of risks, benefits and other options for treatment, the patient has consented to  Procedure(s): CARDIOVERSION (N/A) as a surgical intervention .  The patient's history has been reviewed, patient examined, no change in status, stable for surgery.  I have reviewed the patient's chart and labs.  Questions were answered to the patient's satisfaction.     Dyllen Menning, Wonda Cheng

## 2015-07-14 NOTE — H&P (View-Only) (Signed)
Electrophysiology Office Note   Date:  06/24/2015   ID:  Natasha Fletcher, DOB 08/07/1925, MRN [id number]  PCP:  Myriam Jacobson, MD  Cardiologist:  Claiborne Billings Primary Electrophysiologist:  [name], MD    Chief Complaint  Patient presents with  . Advice Only  . Atrial Flutter     History of Present Illness: Natasha Fletcher is a 82 y.o. female who presents today for electrophysiology evaluation.   She was diagnosed with atrial fibrillation with RVR and 2015 when she was hospitalized. She was found to have an EF of 65-70% with grade 2 diastolic dysfunction and an intracavitary gradient of 16 mmHg. She was anticoagulated and was cardioverted in October 2015. In October 2016, she was found to be in atrial flutter with 4-1 conduction. She had an echocardiogram at that time showed an EF 55-60% and severe LVH. She had moderate biatrial dilation. She was admitted to [facility] December 2016 she was found to be in atrial fibrillation. She was referred for cardioversion on 06/04/15, and is subsequently presented back to her cardiologist clinic in atrial flutter with 4-1 conduction.   Today, she denies symptoms of palpitations, chest pain, shortness of breath, orthopnea, PND, lower extremity edema, claudication, dizziness, presyncope, syncope, bleeding, or neurologic sequela. The patient is tolerating medications without difficulties.  She does complain of some mild fatigue that she says is improved when she is in normal rhythm.  Past Medical History  Diagnosis Date  . Hypertension   . Asthma   . Glaucoma   . Incontinence   . Wears dentures   . Wears glasses   . Thyroid disease     "something years ago"  . GERD (gastroesophageal reflux disease)   . Cancer (HCC)     rt breast  . Edema leg   . Fast breathing     "because of pill"  . Pneumonia     h/o younger  . Arthritis     "all over"  . S/P total knee arthroplasty   . Glaucoma   . Swelling     bilateral feet/  legs, more left.  . Atrial fibrillation ([place])   . Diabetes mellitus without complication ([place])   . UTI (urinary tract infection) 02/12/2015   Past Surgical History  Procedure Laterality Date  . Total hip arthroplasty Bilateral   . Shoulder surgery Left   . Appendectomy    . Carpal tunnel release Bilateral   . Tonsillectomy    . Colonoscopy    . Abdominal hysterectomy    . Eye surgery      both cataracts  . Breast biopsy  04/27/2011    Procedure: BREAST BIOPSY WITH NEEDLE LOCALIZATION;  Surgeon: Edward Jolly, MD;  Location: [facility];  Service: General;  Laterality: Right;  right needle localized breast lumpectomy   . Breast lumpectomy  04/27/11    right breast by [facility]  . Total knee arthroplasty Right 02/19/2013    Procedure: RIGHT TOTAL KNEE ARTHROPLASTY;  Surgeon: Gearlean Alf, MD;  Location: [facility];  Service: Orthopedics;  Laterality: Right;  . Total knee arthroplasty Left 10/15/2013    Procedure: LEFT TOTAL KNEE ARTHROPLASTY;  Surgeon: Gearlean Alf, MD;  Location: [facility];  Service: Orthopedics;  Laterality: Left;  . Cardioversion N/A 02/01/2014    Procedure: CARDIOVERSION;  Surgeon: Candee Furbish, MD;  Location[facility] ENDOSCOPY;  Service: Cardiovascular;  Laterality: N/A;  . Tee without cardioversion N/A 02/01/2014    Procedure: TRANSESOPHAGEAL  ECHOCARDIOGRAM (TEE);  Surgeon: Candee Furbish, MD;  Location: [facility] ENDOSCOPY;  Service: Cardiovascular;  Laterality: N/A;  . Cardioversion N/A 06/04/2015    Procedure: CARDIOVERSION;  Surgeon: Dorothy Spark, MD;  Location: [facility] ENDOSCOPY;  Service: Cardiovascular;  Laterality: N/A;     Current Outpatient Prescriptions  Medication Sig Dispense Refill  . anastrozole (ARIMIDEX) 1 MG tablet TAKE 1 TABLET BY MOUTH EVERY DAY 30 tablet 2  . diltiazem (CARDIZEM CD) 120 MG 24 hr capsule Take 1 capsule (120 mg total) by mouth daily. 90 capsule 3  . feeding supplement (BOOST / RESOURCE BREEZE) LIQD Take 1 Container by mouth 3  (three) times daily between meals. 90 Container 0  . furosemide (LASIX) 20 MG tablet Take 40 mg by mouth 2 (two) times daily.     Marland Kitchen HYDROcodone-acetaminophen (NORCO) 10-325 MG tablet Take 1 tablet by mouth 2 (two) times daily as needed (leg pain).   0  . latanoprost (XALATAN) 0.005 % ophthalmic solution Place 1 drop into both eyes at bedtime.    . meclizine (ANTIVERT) 25 MG tablet Take 12.5 mg by mouth 3 (three) times daily. Reported on 06/03/2015    . Menthol, Topical Analgesic, (ICE BLUE EX) Apply 1 application topically 2 (two) times daily as needed (joint pain). Reported on 06/03/2015    . metoprolol (LOPRESSOR) 100 MG tablet Take 1 tablet (100 mg total) by mouth 2 (two) times daily. 60 tablet 6  . traZODone (DESYREL) 50 MG tablet Take 50 mg by mouth at bedtime.  0  . warfarin (COUMADIN) 5 MG tablet Take 1 tablet by mouth daily or as directed by coumadin clinic 90 tablet 0   No current facility-administered medications for this visit.    Allergies:   Lactose intolerance (gi); Milk-related compounds; and Sulfa antibiotics   Social History:  The patient  reports that she quit smoking about 40 years ago. Her smoking use included Cigarettes. She smoked 0.00 packs per day for 30 years. She has never used smokeless tobacco. She reports that she drinks alcohol. She reports that she does not use illicit drugs.   Family History:  The patient's family history includes Heart disease in her mother; Hypertension in her father and mother; Stroke in her brother, father, and mother. There is no history of Heart attack.    ROS:  Please see the history of present illness.   Otherwise, review of systems is positive for leg swelling, shortness of breath, palpitations, balance problems, joint swelling.   All other systems are reviewed and negative.    PHYSICAL EXAM: VS:  BP 106/70 mmHg  Pulse 77  Ht 5\' 4"  (1.626 m)  Wt 137 lb 9.6 oz (62.415 kg)  BMI 23.61 kg/m2 , BMI Body mass index is 23.61 kg/(m^2). GEN:  Well nourished, well developed, in no acute distress HEENT: normal Neck: no JVD, carotid bruits, or masses Cardiac: RRR; no murmurs, rubs, or gallops,no edema  Respiratory:  clear to auscultation bilaterally, normal work of breathing GI: soft, nontender, nondistended, + BS MS: no deformity or atrophy Skin: warm and dry Neuro:  Strength and sensation are intact Psych: euthymic mood, full affect  EKG:  EKG is ordered today. The ekg ordered today shows atrial flutter, rate 77, LAFB   Recent Labs: 02/17/2015: Magnesium 1.7 06/02/2015: TSH 1.22 06/03/2015: ALT 30; BUN 29.1*; Creatinine 1.2*; HGB 12.3; Platelets 199; Potassium 3.9; Sodium 140    Lipid Panel  No results found for: CHOL, TRIG, HDL, CHOLHDL, VLDL, LDLCALC, LDLDIRECT   Wt  Readings from Last 3 Encounters:  06/24/15 137 lb 9.6 oz (62.415 kg)  06/19/15 136 lb 8 oz (61.916 kg)  06/04/15 139 lb (63.05 kg)      Other studies Reviewed: Additional studies/ records that were reviewed today include: TTE 01/17/15  Review of the above records today demonstrates:  - Left ventricle: The cavity size was mildly reduced. Wall  thickness was increased in a pattern of severe LVH. Systolic  function was normal. The estimated ejection fraction was in the  range of 55% to 60%. - Left atrium: The atrium was moderately dilated. - Right atrium: The atrium was mildly dilated. - Pericardium, extracardiac: A trivial pericardial effusion was  identified.    ASSESSMENT AND PLAN:  1.  Persistent Atrial fibrillation/flutter: CHADS2VASc of 5.  On coumadin.  Unfortunately if atrial flutter ablation was performed, she would likely go back in atrial fibrillation. She would be at very high risk for complications during atrial fibrillation ablation. It is therefore likely that she would benefit most from antiarrhythmic therapy. She has a history of renal failure, and therefore both sotalol and dofetilide are unlikely to be helpful. We'll  therefore start her on amiodarone today, with cardioversion in 2 weeks once amiodarone has been loaded. We'll see her back in clinic in 3 months to determine if she is still in sinus rhythm at that time. It is likely that this Augustino Savastano be successful, as she does not have yet have a severely dilated left atrium.    Current medicines are reviewed at length with the patient today.   The patient does not have concerns regarding her medicines.  The following changes were made today:  amiodarone  Labs/ tests ordered today include:  No orders of the defined types were placed in this encounter.     Disposition:   FU with Shenelle Klas 3 months  Signed, Mckinlee Dunk Meredith Leeds, MD  06/24/2015 11:30 AM     [facility] [address] [place] [address] [phone] (office) [phone] (fax)

## 2015-07-14 NOTE — Discharge Instructions (Signed)
Electrical Cardioversion, Care After °Refer to this sheet in the next few weeks. These instructions provide you with information on caring for yourself after your procedure. Your health care provider may also give you more specific instructions. Your treatment has been planned according to current medical practices, but problems sometimes occur. Call your health care provider if you have any problems or questions after your procedure. °WHAT TO EXPECT AFTER THE PROCEDURE °After your procedure, it is typical to have the following sensations: °· Some redness on the skin where the shocks were delivered. If this is tender, a sunburn lotion or hydrocortisone cream may help. °· Possible return of an abnormal heart rhythm within hours or days after the procedure. °HOME CARE INSTRUCTIONS °· Take medicines only as directed by your health care provider. Be sure you understand how and when to take your medicine. °· Learn how to feel your pulse and check it often. °· Limit your activity for 48 hours after the procedure or as directed by your health care provider. °· Avoid or minimize caffeine and other stimulants as directed by your health care provider. °SEEK MEDICAL CARE IF: °· You feel like your heart is beating too fast or your pulse is not regular. °· You have any questions about your medicines. °· You have bleeding that will not stop. °SEEK IMMEDIATE MEDICAL CARE IF: °· You are dizzy or feel faint. °· It is hard to breathe or you feel short of breath. °· There is a change in discomfort in your chest. °· Your speech is slurred or you have trouble moving an arm or leg on one side of your body. °· You get a serious muscle cramp that does not go away. °· Your fingers or toes turn cold or blue. °  °This information is not intended to replace advice given to you by your health care provider. Make sure you discuss any questions you have with your health care provider. °  °Document Released: 01/10/2013 Document Revised: 04/12/2014  Document Reviewed: 01/10/2013 °Elsevier Interactive Patient Education ©2016 [employer] ° °

## 2015-07-16 ENCOUNTER — Encounter (HOSPITAL_COMMUNITY): Payer: Self-pay | Admitting: Cardiovascular Disease

## 2015-07-18 ENCOUNTER — Ambulatory Visit (INDEPENDENT_AMBULATORY_CARE_PROVIDER_SITE_OTHER): Payer: Medicare Other | Admitting: Pharmacist Clinician (PhC)/ Clinical Pharmacy Specialist

## 2015-07-18 ENCOUNTER — Telehealth: Payer: Self-pay | Admitting: Cardiology

## 2015-07-18 DIAGNOSIS — Z7901 Long term (current) use of anticoagulants: Secondary | ICD-10-CM

## 2015-07-18 DIAGNOSIS — I480 Paroxysmal atrial fibrillation: Secondary | ICD-10-CM

## 2015-07-18 DIAGNOSIS — I4892 Unspecified atrial flutter: Secondary | ICD-10-CM

## 2015-07-18 LAB — POCT INR: INR: 3.5

## 2015-07-18 NOTE — Telephone Encounter (Signed)
LM for pharmacy ok to fill, we are aware patient is on both medications

## 2015-07-18 NOTE — Telephone Encounter (Signed)
New Message:    Pt was prescribed Amiodarone 200 mg and they are concerned because pt is on Warfarin.Natasha Fletcher

## 2015-08-01 ENCOUNTER — Telehealth: Payer: Self-pay | Admitting: Cardiovascular Disease

## 2015-08-01 ENCOUNTER — Ambulatory Visit (INDEPENDENT_AMBULATORY_CARE_PROVIDER_SITE_OTHER): Payer: Medicare Other | Admitting: Pharmacist

## 2015-08-01 DIAGNOSIS — Z7901 Long term (current) use of anticoagulants: Secondary | ICD-10-CM | POA: Diagnosis not present

## 2015-08-01 DIAGNOSIS — I480 Paroxysmal atrial fibrillation: Secondary | ICD-10-CM

## 2015-08-01 DIAGNOSIS — I4892 Unspecified atrial flutter: Secondary | ICD-10-CM

## 2015-08-01 LAB — POCT INR: INR: 8

## 2015-08-01 LAB — PROTIME-INR
INR: 6.63 — AB (ref <1.50)
PROTHROMBIN TIME: 59 s — AB (ref 11.6–15.2)

## 2015-08-01 NOTE — Telephone Encounter (Signed)
Returned call. Report for INR of 6.63 read back and verified. Lab values viewable in EPIC.  Routed to pharmD for review.

## 2015-08-01 NOTE — Telephone Encounter (Signed)
New message      Calling to report a critical lab result.  Reference number [id number]

## 2015-08-01 NOTE — Telephone Encounter (Signed)
Thanks! I have already called the patient with the results and instructions and we will see her next week in clinic to recheck.

## 2015-08-05 ENCOUNTER — Telehealth: Payer: Self-pay | Admitting: Pharmacist

## 2015-08-05 NOTE — Telephone Encounter (Signed)
Returned call to patient about drowsiness and dizziness. Patient states she is frequently tired and dizzy in the afternoons. She states she is very active going up and down the stairs in the mornings cleaning and then will be very exhausted in the afternoons and feel as if she is going "to fall on the floor."  She also states that she will sleep for 2-3 hours when she takes her trazodone (but also sleeps the same amount when she skips it). Told her to do a trial off trazodone to see if this helps. I also asked her about bleeding issues as her INR was very elevated last week in our office. She says she has not had any issues with bleeding.  Will follow-up with her again at her INR visit on Friday.

## 2015-08-08 ENCOUNTER — Ambulatory Visit (INDEPENDENT_AMBULATORY_CARE_PROVIDER_SITE_OTHER): Payer: Medicare Other | Admitting: Pharmacist Clinician (PhC)/ Clinical Pharmacy Specialist

## 2015-08-08 DIAGNOSIS — Z7901 Long term (current) use of anticoagulants: Secondary | ICD-10-CM | POA: Diagnosis not present

## 2015-08-08 DIAGNOSIS — I480 Paroxysmal atrial fibrillation: Secondary | ICD-10-CM | POA: Diagnosis not present

## 2015-08-08 DIAGNOSIS — I4892 Unspecified atrial flutter: Secondary | ICD-10-CM | POA: Diagnosis not present

## 2015-08-08 LAB — POCT INR: INR: 2.2

## 2015-08-13 ENCOUNTER — Other Ambulatory Visit: Payer: Self-pay | Admitting: Adult Health

## 2015-08-19 ENCOUNTER — Telehealth: Payer: Self-pay | Admitting: Cardiology

## 2015-08-19 MED ORDER — METOPROLOL TARTRATE 25 MG PO TABS: 25.0000 mg | ORAL_TABLET | Freq: Two times a day (BID) | ORAL | Status: DC

## 2015-08-19 NOTE — Telephone Encounter (Signed)
New message      Pt states she has no energy and is very dizzy.  Can it be some of her medications?  She states that she takes a lot of medication.  Please advise

## 2015-08-19 NOTE — Telephone Encounter (Signed)
Patient recently started on Metoprolol.  We discussed this may be the cause of her fatigue and it is not uncommon to experience this in the first weeks after beginning this medication. Discussed dizzy spells and how this may be related to medication also.  She has home BP cuff and reported 118/85 She has agreed to keep track of her BPs when she having dizzy spells and report low numbers to me. Advised to decrease Metoprolol to 25 mg BID and discussed new dose  (reviewed and approved by Dr. Curt Bears). Patient will call back if BP drops or fatigue does not  Improve in the next few week. She thanks me several times for being so nice and helping.

## 2015-08-21 ENCOUNTER — Emergency Department (HOSPITAL_COMMUNITY): Payer: Medicare Other

## 2015-08-21 ENCOUNTER — Inpatient Hospital Stay (HOSPITAL_COMMUNITY)
Admission: EM | Admit: 2015-08-21 | Discharge: 2015-08-25 | DRG: 242 | Disposition: A | Discharge status: Skilled Nursing Facility | Payer: Medicare Other | Attending: Cardiology | Admitting: Cardiology

## 2015-08-21 DIAGNOSIS — R0602 Shortness of breath: Secondary | ICD-10-CM | POA: Diagnosis not present

## 2015-08-21 DIAGNOSIS — I495 Sick sinus syndrome: Principal | ICD-10-CM | POA: Diagnosis present

## 2015-08-21 DIAGNOSIS — I110 Hypertensive heart disease with heart failure: Secondary | ICD-10-CM | POA: Diagnosis present

## 2015-08-21 DIAGNOSIS — I4892 Unspecified atrial flutter: Secondary | ICD-10-CM | POA: Diagnosis present

## 2015-08-21 DIAGNOSIS — Z96653 Presence of artificial knee joint, bilateral: Secondary | ICD-10-CM | POA: Diagnosis present

## 2015-08-21 DIAGNOSIS — N390 Urinary tract infection, site not specified: Secondary | ICD-10-CM | POA: Diagnosis present

## 2015-08-21 DIAGNOSIS — Z96643 Presence of artificial hip joint, bilateral: Secondary | ICD-10-CM | POA: Diagnosis present

## 2015-08-21 DIAGNOSIS — Z7901 Long term (current) use of anticoagulants: Secondary | ICD-10-CM

## 2015-08-21 DIAGNOSIS — I4891 Unspecified atrial fibrillation: Secondary | ICD-10-CM | POA: Diagnosis present

## 2015-08-21 DIAGNOSIS — Z87891 Personal history of nicotine dependence: Secondary | ICD-10-CM

## 2015-08-21 DIAGNOSIS — I5043 Acute on chronic combined systolic (congestive) and diastolic (congestive) heart failure: Secondary | ICD-10-CM | POA: Diagnosis present

## 2015-08-21 DIAGNOSIS — E119 Type 2 diabetes mellitus without complications: Secondary | ICD-10-CM | POA: Diagnosis present

## 2015-08-21 DIAGNOSIS — Z95818 Presence of other cardiac implants and grafts: Secondary | ICD-10-CM

## 2015-08-21 DIAGNOSIS — R001 Bradycardia, unspecified: Secondary | ICD-10-CM | POA: Diagnosis present

## 2015-08-21 DIAGNOSIS — H409 Unspecified glaucoma: Secondary | ICD-10-CM | POA: Diagnosis present

## 2015-08-21 DIAGNOSIS — K219 Gastro-esophageal reflux disease without esophagitis: Secondary | ICD-10-CM | POA: Diagnosis present

## 2015-08-21 DIAGNOSIS — Z853 Personal history of malignant neoplasm of breast: Secondary | ICD-10-CM

## 2015-08-21 DIAGNOSIS — I5021 Acute systolic (congestive) heart failure: Secondary | ICD-10-CM

## 2015-08-21 LAB — I-STAT CHEM 8, ED
BUN: 39 mg/dL — AB (ref 6–20)
CALCIUM ION: 1.19 mmol/L (ref 1.13–1.30)
CHLORIDE: 103 mmol/L (ref 101–111)
CREATININE: 1.4 mg/dL — AB (ref 0.44–1.00)
Glucose, Bld: 116 mg/dL — ABNORMAL HIGH (ref 65–99)
HCT: 38 % (ref 36.0–46.0)
Hemoglobin: 12.9 g/dL (ref 12.0–15.0)
Potassium: 4.4 mmol/L (ref 3.5–5.1)
Sodium: 139 mmol/L (ref 135–145)
TCO2: 25 mmol/L (ref 0–100)

## 2015-08-21 LAB — BASIC METABOLIC PANEL
Anion gap: 10 (ref 5–15)
BUN: 34 mg/dL — AB (ref 6–20)
CHLORIDE: 103 mmol/L (ref 101–111)
CO2: 23 mmol/L (ref 22–32)
CREATININE: 1.51 mg/dL — AB (ref 0.44–1.00)
Calcium: 9.3 mg/dL (ref 8.9–10.3)
GFR calc Af Amer: 35 mL/min — ABNORMAL LOW (ref >60)
GFR calc non Af Amer: 30 mL/min — ABNORMAL LOW (ref >60)
GLUCOSE: 130 mg/dL — AB (ref 65–99)
Potassium: 4.3 mmol/L (ref 3.5–5.1)
SODIUM: 136 mmol/L (ref 135–145)

## 2015-08-21 LAB — CBC
HEMATOCRIT: 32.9 % — AB (ref 36.0–46.0)
Hemoglobin: 10.3 g/dL — ABNORMAL LOW (ref 12.0–15.0)
MCH: 26.8 pg (ref 26.0–34.0)
MCHC: 31.3 g/dL (ref 30.0–36.0)
MCV: 85.7 fL (ref 78.0–100.0)
PLATELETS: 260 10*3/uL (ref 150–400)
RBC: 3.84 MIL/uL — ABNORMAL LOW (ref 3.87–5.11)
RDW: 16.4 % — AB (ref 11.5–15.5)
WBC: 5.7 10*3/uL (ref 4.0–10.5)

## 2015-08-21 LAB — I-STAT TROPONIN, ED: Troponin i, poc: 0.08 ng/mL (ref 0.00–0.08)

## 2015-08-21 LAB — I-STAT CG4 LACTIC ACID, ED: LACTIC ACID, VENOUS: 1.04 mmol/L (ref 0.5–2.0)

## 2015-08-21 MED ORDER — FUROSEMIDE 10 MG/ML IJ SOLN
40.0000 mg | Freq: Once | INTRAMUSCULAR | Status: AC
  Administered 2015-08-21: 40 mg via INTRAVENOUS
  Filled 2015-08-21: qty 4

## 2015-08-21 NOTE — ED Notes (Addendum)
Per EMS, pt from home with complains of sob x 3 days with exertion. Pt's diltiazem have decreased, but pt is in junctional rhythm HR 38, BP 176/67, RR 24, spo2 98% on RA. Pt states she has increased edema in feet recently. Pt denies CP with EMS and here. Pt also states that she is having lower abd pain that started this evening, pt has cath in place and states that she has had multiple UTI's.

## 2015-08-21 NOTE — ED Notes (Signed)
Portable in room.  

## 2015-08-21 NOTE — ED Provider Notes (Signed)
CSN: [id number]     Arrival date & time 08/21/15  2301 History  By signing my name below, I, Altamease Oiler, attest that this documentation has been prepared under the direction and in the presence of Ripley Fraise, MD. Electronically Signed: Altamease Oiler, ED Scribe. 08/21/2015. 11:43 PM   Chief Complaint  Patient presents with  . Shortness of Breath    Patient is a 82 y.o. female presenting with shortness of breath. The history is provided by the patient. No language interpreter was used.  Shortness of Breath Severity:  Moderate Onset quality:  Gradual Duration:  4 hours Timing:  Constant Progression:  Worsening Chronicity:  New Relieved by:  Nothing Worsened by:  Exertion Ineffective treatments:  Rest Associated symptoms: abdominal pain, chest pain and headaches   Associated symptoms: no cough and no vomiting   Risk factors: hx of cancer (breast)    Natasha Fletcher is a 82 y.o. female with PMhx of asthma, a-fib, HTN, and DM who presents to the Emergency Department complaining of worsening SOB with onset 3-4 days ago. Her breathing is worse with exertion and mildly improved with rest. Pt states that she has not had a good nights sleep in a week. Associated symptoms include fever, mild headache, chest pain, and nausea . Pt also complains of recurrent  lower abdominal pain for 1 week and is concerned for UTI despite having a negative culture at her urologist's office last week. She has a catheter that is always in place. Her brother reports that she has had 4 UTIs this year and with each her heart rate was noted to be low. Pt denies vomiting and syncope.  She has had no recent change in her heart medication.   Past Medical History  Diagnosis Date  . Hypertension   . Asthma   . Glaucoma   . Incontinence   . Wears dentures   . Wears glasses   . Thyroid disease     "something years ago"  . GERD (gastroesophageal reflux disease)   . Cancer (HCC)     rt breast  . Edema leg   .  Fast breathing     "because of pill"  . Pneumonia     h/o younger  . Arthritis     "all over"  . S/P total knee arthroplasty   . Glaucoma   . Swelling     bilateral feet/ legs, more left.  . Atrial fibrillation ([place])   . Diabetes mellitus without complication ([place])   . UTI (urinary tract infection) 02/12/2015   Past Surgical History  Procedure Laterality Date  . Total hip arthroplasty Bilateral   . Shoulder surgery Left   . Appendectomy    . Carpal tunnel release Bilateral   . Tonsillectomy    . Colonoscopy    . Abdominal hysterectomy    . Eye surgery      both cataracts  . Breast biopsy  04/27/2011    Procedure: BREAST BIOPSY WITH NEEDLE LOCALIZATION;  Surgeon: Edward Jolly, MD;  Location: [place];  Service: General;  Laterality: Right;  right needle localized breast lumpectomy   . Breast lumpectomy  04/27/11    right breast by [facility]  . Total knee arthroplasty Right 02/19/2013    Procedure: RIGHT TOTAL KNEE ARTHROPLASTY;  Surgeon: Gearlean Alf, MD;  Location: [facility];  Service: Orthopedics;  Laterality: Right;  . Total knee arthroplasty Left 10/15/2013    Procedure: LEFT TOTAL KNEE ARTHROPLASTY;  Surgeon: Gaynelle Arabian  V, MD;  Location: [facility];  Service: Orthopedics;  Laterality: Left;  . Cardioversion N/A 02/01/2014    Procedure: CARDIOVERSION;  Surgeon: Candee Furbish, MD;  Location: [facility] ENDOSCOPY;  Service: Cardiovascular;  Laterality: N/A;  . Tee without cardioversion N/A 02/01/2014    Procedure: TRANSESOPHAGEAL ECHOCARDIOGRAM (TEE);  Surgeon: Candee Furbish, MD;  Location: [facility] ENDOSCOPY;  Service: Cardiovascular;  Laterality: N/A;  . Cardioversion N/A 06/04/2015    Procedure: CARDIOVERSION;  Surgeon: Dorothy Spark, MD;  Location: [place];  Service: Cardiovascular;  Laterality: N/A;  . Cardioversion N/A 07/14/2015    Procedure: CARDIOVERSION;  Surgeon: Thayer Headings, MD;  Location: [facility] ENDOSCOPY;  Service: Cardiovascular;  Laterality: N/A;    Family History  Problem Relation Age of Onset  . Heart disease Mother   . Stroke Father   . Heart attack Neg Hx   . Stroke Mother   . Stroke Brother   . Hypertension Mother   . Hypertension Father    Social History  Substance Use Topics  . Smoking status: Former Smoker -- 0.00 packs/day for 30 years    Types: Cigarettes    Quit date: 04/21/1975  . Smokeless tobacco: Never Used  . Alcohol Use: Yes     Comment: occasionally   OB History    No data available     Review of Systems  Constitutional: Positive for fatigue.  Respiratory: Positive for shortness of breath. Negative for cough.   Cardiovascular: Positive for chest pain and leg swelling.  Gastrointestinal: Positive for nausea and abdominal pain. Negative for vomiting.  Neurological: Positive for headaches. Negative for syncope.  Psychiatric/Behavioral: Positive for sleep disturbance.  All other systems reviewed and are negative.     Allergies  Lactose intolerance (gi); Milk-related compounds; and Sulfa antibiotics  Home Medications   Prior to Admission medications   Medication Sig Start Date End Date Taking? Authorizing Provider  amiodarone (PACERONE) 200 MG tablet Take 1 tablet (200 mg total) by mouth 2 (two) times daily. For two weeks Patient not taking: Reported on 07/11/2015 06/24/15   Will Meredith Leeds, MD  amiodarone (PACERONE) 200 MG tablet Take 1 tablet (200 mg total) by mouth daily. 06/24/15   Will Meredith Leeds, MD  anastrozole (ARIMIDEX) 1 MG tablet TAKE 1 TABLET BY MOUTH EVERY DAY 08/13/15   Monina C Medina-Vargas, NP  diltiazem (CARDIZEM CD) 120 MG 24 hr capsule Take 1 capsule (120 mg total) by mouth daily. 05/14/15   Brittainy Erie Noe, PA-C  feeding supplement (BOOST / RESOURCE BREEZE) LIQD Take 1 Container by mouth 3 (three) times daily between meals. Patient not taking: Reported on 07/11/2015 02/15/15   Orson Eva, MD  furosemide (LASIX) 20 MG tablet Take 40 mg by mouth 2 (two) times daily.      Historical Provider, MD  HYDROcodone-acetaminophen (NORCO) 10-325 MG tablet Take 1 tablet by mouth 2 (two) times daily as needed (leg pain).  05/06/15   Historical Provider, MD  latanoprost (XALATAN) 0.005 % ophthalmic solution Place 1 drop into both eyes at bedtime.    Historical Provider, MD  meclizine (ANTIVERT) 25 MG tablet Take 12.5 mg by mouth 3 (three) times daily. Reported on 06/03/2015    Historical Provider, MD  Menthol, Topical Analgesic, (ICE BLUE EX) Apply 1 application topically 5 (five) times daily as needed (joint pain). Reported on 06/03/2015    Historical Provider, MD  metoprolol (LOPRESSOR) 25 MG tablet Take 1 tablet (25 mg total) by mouth 2 (two) times daily. 08/19/15   Will  Meredith Leeds, MD  traZODone (DESYREL) 50 MG tablet Take 50 mg by mouth at bedtime. 04/09/15   Historical Provider, MD  warfarin (COUMADIN) 5 MG tablet Take 1 tablet by mouth daily or as directed by coumadin clinic Patient taking differently: Take 5 mg by mouth daily at 12 noon. or as directed by coumadin clinic 05/23/15   Troy Sine, MD   BP 165/78 mmHg  Pulse 37  Temp(Src) 97.8 F (36.6 C) (Oral)  Resp 15  Ht 5\' 6"  (1.676 m)  Wt 141 lb (63.957 kg)  BMI 22.77 kg/m2  SpO2 99% Physical Exam CONSTITUTIONAL: Elderly, frail appearing HEAD: Normocephalic/atraumatic EYES: EOMI ENMT: Mucous membranes moist NECK: supple no meningeal signs SPINE/BACK:entire spine nontender CV: Bradycardic and irregular, no loud murmur  LUNGS: Tachypnea, crackles bilaterally  ABDOMEN: soft, nontender, no rebound or guarding, bowel sounds noted throughout abdomen GU:no cva tenderness, foley in place on arrival  NEURO: Pt is awake/alert/appropriate, moves all extremitiesx4.  No facial droop.   EXTREMITIES: pulses normal/equal, full ROM, pitting edema noted to LE SKIN: warm, color normal PSYCH: no abnormalities of mood noted, alert and oriented to situation  ED Course  Procedures  CRITICAL CARE Performed by:  Sharyon Cable Total critical care time: 35 minutes Critical care time was exclusive of separately billable procedures and treating other patients. Critical care was necessary to treat or prevent imminent or life-threatening deterioration. Critical care was time spent personally by me on the following activities: development of treatment plan with patient and/or surrogate as well as nursing, discussions with consultants, evaluation of patient's response to treatment, examination of patient, obtaining history from patient or surrogate, ordering and performing treatments and interventions, ordering and review of laboratory studies, ordering and review of radiographic studies, pulse oximetry and re-evaluation of patient's condition. PATIENT BRADYCARDIC WITH HEART RATE 35 SHE REQUIRED MULTIPLE RE-EVALUATIONS CARDIOLOGY CONSULTED  DIAGNOSTIC STUDIES: Oxygen Saturation is 99% on RA,  normal by my interpretation.    COORDINATION OF CARE: 11:16 PM Discussed treatment plan which includes lab work, CXR, EKG with pt at bedside and pt agreed to plan.  11:39 PM-Consult complete with Dr. Aundra Dubin (Cardiology). Patient case explained and discussed. Call ended at 11:41 PM  11:42 PM I re-evaluated the patient and provided an update on my conversation with Cardiology.   Pt with probable junctional rhythm Her BP is appropriate She is awake/alert She has ST changes, but does not appear to have acute STEMI at this time [facility] cardiology for guidance 11:49 PM D/w dr Aundra Dubin We discused EKG/CXR findings He will see patient She likely has CHF Lasix advised Pt awake/alert, bradycardic but hypertensive 12:05 AM Pt resting comfortably BP 146/79 mmHg  Pulse 34  Temp(Src) 97.8 F (36.6 C) (Oral)  Resp 16  Ht 5\' 6"  (1.676 m)  Wt 63.957 kg  BMI 22.77 kg/m2  SpO2 99% Awaiting admission   Labs Review Labs Reviewed  BASIC METABOLIC PANEL - Abnormal; Notable for the following:    Glucose, Bld 130 (*)     BUN 34 (*)    Creatinine, Ser 1.51 (*)    GFR calc non Af Amer 30 (*)    GFR calc Af Amer 35 (*)    All other components within normal limits  CBC - Abnormal; Notable for the following:    RBC 3.84 (*)    Hemoglobin 10.3 (*)    HCT 32.9 (*)    RDW 16.4 (*)    All other components within normal limits  PROTIME-INR - Abnormal;  Notable for the following:    Prothrombin Time 45.2 (*)    All other components within normal limits  I-STAT CHEM 8, ED - Abnormal; Notable for the following:    BUN 39 (*)    Creatinine, Ser 1.40 (*)    Glucose, Bld 116 (*)    All other components within normal limits  URINALYSIS, ROUTINE W REFLEX MICROSCOPIC (NOT AT [facility])  BRAIN NATRIURETIC PEPTIDE  I-STAT TROPOININ, ED  I-STAT CG4 LACTIC ACID, ED    Imaging Review Dg Chest Portable 1 View  08/21/2015  CLINICAL DATA:  Initial valuation for 3 day history of acute shortness of breath. Lower extremity swelling. EXAM: PORTABLE CHEST 1 VIEW COMPARISON:  Prior study from 05/09/2015. FINDINGS: To particular but later pat it is overlie the left chest. Moderate cardiomegaly is stable. Mediastinal silhouette within normal limits. Diffuse pulmonary vascular congestion with indistinctness of the nursing show markings, consistent with moderate diffuse pulmonary edema. Small bilateral pleural effusions present. Findings consistent with acute CHF exacerbation. No definite focal infiltrates. No pneumothorax. Diffuse osteopenia noted.  No acute osseous abnormality. IMPRESSION: Cardiomegaly with moderate diffuse pulmonary edema and bilateral pleural effusions, consistent with acute CHF exacerbation. Electronically Signed   By: Jeannine Boga M.D.   On: 08/21/2015 23:48   I have personally reviewed and evaluated these images and lab results as part of my medical decision-making.   EKG Interpretation   Date/Time:  Thursday Aug 21 2015 23:09:03 EDT Ventricular Rate:  36 PR Interval:    QRS Duration: 112 QT Interval:   623 QTC Calculation: 482 R Axis:   -167 Text Interpretation:  Junctional rhythm Incomplete right bundle branch  block Consider left ventricular hypertrophy Probable lateral infarct,  recent Abnormal ekg changed from prior Confirmed by Christy Gentles  MD, [place]  [phone]) on 08/21/2015 11:23:39 PM     Medications  furosemide (LASIX) injection 40 mg (40 mg Intravenous Given 08/21/15 2350)    MDM   Final diagnoses:  Bradycardia  Acute systolic congestive heart failure ([place])    Nursing notes including past medical history and social history reviewed and considered in documentation xrays/imaging reviewed by myself and considered during evaluation Labs/vital reviewed myself and considered during evaluation Previous records reviewed and considered    I personally performed the services described in this documentation, which was scribed in my presence. The recorded information has been reviewed and is accurate.       Ripley Fraise, MD 08/22/15 [phone]

## 2015-08-21 NOTE — ED Notes (Signed)
Zoll pads on patient, zoll turned on and at bedside.

## 2015-08-22 ENCOUNTER — Encounter: Payer: Medicare Other | Admitting: Pharmacist Clinician (PhC)/ Clinical Pharmacy Specialist

## 2015-08-22 ENCOUNTER — Inpatient Hospital Stay (HOSPITAL_COMMUNITY): Payer: Medicare Other

## 2015-08-22 ENCOUNTER — Encounter (HOSPITAL_COMMUNITY)
Admission: EM | Disposition: A | Discharge status: Skilled Nursing Facility | Payer: Self-pay | Source: Home / Self Care | Attending: Cardiology

## 2015-08-22 ENCOUNTER — Encounter (HOSPITAL_COMMUNITY): Payer: Self-pay

## 2015-08-22 DIAGNOSIS — N3000 Acute cystitis without hematuria: Secondary | ICD-10-CM | POA: Diagnosis not present

## 2015-08-22 DIAGNOSIS — N390 Urinary tract infection, site not specified: Secondary | ICD-10-CM | POA: Diagnosis present

## 2015-08-22 DIAGNOSIS — Z96653 Presence of artificial knee joint, bilateral: Secondary | ICD-10-CM | POA: Diagnosis present

## 2015-08-22 DIAGNOSIS — Z96643 Presence of artificial hip joint, bilateral: Secondary | ICD-10-CM | POA: Diagnosis present

## 2015-08-22 DIAGNOSIS — I5021 Acute systolic (congestive) heart failure: Secondary | ICD-10-CM | POA: Insufficient documentation

## 2015-08-22 DIAGNOSIS — I480 Paroxysmal atrial fibrillation: Secondary | ICD-10-CM | POA: Diagnosis not present

## 2015-08-22 DIAGNOSIS — I4891 Unspecified atrial fibrillation: Secondary | ICD-10-CM

## 2015-08-22 DIAGNOSIS — R0602 Shortness of breath: Secondary | ICD-10-CM | POA: Diagnosis present

## 2015-08-22 DIAGNOSIS — Z87891 Personal history of nicotine dependence: Secondary | ICD-10-CM | POA: Diagnosis not present

## 2015-08-22 DIAGNOSIS — K219 Gastro-esophageal reflux disease without esophagitis: Secondary | ICD-10-CM | POA: Diagnosis present

## 2015-08-22 DIAGNOSIS — I4892 Unspecified atrial flutter: Secondary | ICD-10-CM | POA: Diagnosis present

## 2015-08-22 DIAGNOSIS — E119 Type 2 diabetes mellitus without complications: Secondary | ICD-10-CM | POA: Diagnosis present

## 2015-08-22 DIAGNOSIS — I495 Sick sinus syndrome: Secondary | ICD-10-CM | POA: Diagnosis not present

## 2015-08-22 DIAGNOSIS — R001 Bradycardia, unspecified: Secondary | ICD-10-CM | POA: Diagnosis present

## 2015-08-22 DIAGNOSIS — H409 Unspecified glaucoma: Secondary | ICD-10-CM | POA: Diagnosis present

## 2015-08-22 DIAGNOSIS — I5043 Acute on chronic combined systolic (congestive) and diastolic (congestive) heart failure: Secondary | ICD-10-CM | POA: Diagnosis present

## 2015-08-22 DIAGNOSIS — I110 Hypertensive heart disease with heart failure: Secondary | ICD-10-CM | POA: Diagnosis present

## 2015-08-22 DIAGNOSIS — I5033 Acute on chronic diastolic (congestive) heart failure: Secondary | ICD-10-CM

## 2015-08-22 DIAGNOSIS — I5023 Acute on chronic systolic (congestive) heart failure: Secondary | ICD-10-CM | POA: Diagnosis not present

## 2015-08-22 DIAGNOSIS — Z7901 Long term (current) use of anticoagulants: Secondary | ICD-10-CM | POA: Diagnosis not present

## 2015-08-22 DIAGNOSIS — Z853 Personal history of malignant neoplasm of breast: Secondary | ICD-10-CM | POA: Diagnosis not present

## 2015-08-22 HISTORY — PX: EP IMPLANTABLE DEVICE: SHX172B

## 2015-08-22 LAB — BASIC METABOLIC PANEL
ANION GAP: 11 (ref 5–15)
BUN: 32 mg/dL — ABNORMAL HIGH (ref 6–20)
CHLORIDE: 104 mmol/L (ref 101–111)
CO2: 26 mmol/L (ref 22–32)
Calcium: 9.7 mg/dL (ref 8.9–10.3)
Creatinine, Ser: 1.39 mg/dL — ABNORMAL HIGH (ref 0.44–1.00)
GFR calc Af Amer: 38 mL/min — ABNORMAL LOW (ref >60)
GFR, EST NON AFRICAN AMERICAN: 33 mL/min — AB (ref >60)
GLUCOSE: 102 mg/dL — AB (ref 65–99)
POTASSIUM: 4.4 mmol/L (ref 3.5–5.1)
Sodium: 141 mmol/L (ref 135–145)

## 2015-08-22 LAB — SURGICAL PCR SCREEN
MRSA, PCR: NEGATIVE
STAPHYLOCOCCUS AUREUS: NEGATIVE

## 2015-08-22 LAB — URINE MICROSCOPIC-ADD ON

## 2015-08-22 LAB — CBC
HEMATOCRIT: 34.6 % — AB (ref 36.0–46.0)
HEMOGLOBIN: 10.7 g/dL — AB (ref 12.0–15.0)
MCH: 26.4 pg (ref 26.0–34.0)
MCHC: 30.9 g/dL (ref 30.0–36.0)
MCV: 85.2 fL (ref 78.0–100.0)
PLATELETS: 256 10*3/uL (ref 150–400)
RBC: 4.06 MIL/uL (ref 3.87–5.11)
RDW: 16.3 % — ABNORMAL HIGH (ref 11.5–15.5)
WBC: 5.7 10*3/uL (ref 4.0–10.5)

## 2015-08-22 LAB — URINALYSIS, ROUTINE W REFLEX MICROSCOPIC
Bilirubin Urine: NEGATIVE
GLUCOSE, UA: NEGATIVE mg/dL
Ketones, ur: NEGATIVE mg/dL
NITRITE: POSITIVE — AB
PH: 5 (ref 5.0–8.0)
Protein, ur: 30 mg/dL — AB
SPECIFIC GRAVITY, URINE: 1.025 (ref 1.005–1.030)

## 2015-08-22 LAB — TYPE AND SCREEN
ABO/RH(D): O POS
Antibody Screen: NEGATIVE

## 2015-08-22 LAB — PROTIME-INR
INR: 5 — ABNORMAL HIGH (ref 0.00–1.49)
INR: 4.49 — AB (ref 0.00–1.49)
INR: 4.59 — ABNORMAL HIGH (ref 0.00–1.49)
PROTHROMBIN TIME: 42.1 s — AB (ref 11.6–15.2)
Prothrombin Time: 45.2 seconds — ABNORMAL HIGH (ref 11.6–15.2)
Prothrombin Time: 41.5 seconds — ABNORMAL HIGH (ref 11.6–15.2)

## 2015-08-22 LAB — ABO/RH: ABO/RH(D): O POS

## 2015-08-22 LAB — URINE CULTURE

## 2015-08-22 LAB — ECHOCARDIOGRAM COMPLETE
HEIGHTINCHES: 66 in
WEIGHTICAEL: 2214.4 [oz_av]

## 2015-08-22 LAB — BRAIN NATRIURETIC PEPTIDE
B NATRIURETIC PEPTIDE 5: 1225.5 pg/mL — AB (ref 0.0–100.0)
B Natriuretic Peptide: 1138.1 pg/mL — ABNORMAL HIGH (ref 0.0–100.0)

## 2015-08-22 LAB — MAGNESIUM: MAGNESIUM: 2.1 mg/dL (ref 1.7–2.4)

## 2015-08-22 LAB — TSH: TSH: 1.803 u[IU]/mL (ref 0.350–4.500)

## 2015-08-22 LAB — MRSA PCR SCREENING: MRSA by PCR: NEGATIVE

## 2015-08-22 SURGERY — PACEMAKER IMPLANT
Anesthesia: LOCAL

## 2015-08-22 MED ORDER — SODIUM CHLORIDE 0.9 % IV SOLN: INTRAVENOUS | Status: DC

## 2015-08-22 MED ORDER — TRAZODONE HCL 50 MG PO TABS
50.0000 mg | ORAL_TABLET | Freq: Every day | ORAL | Status: DC
  Administered 2015-08-23 – 2015-08-24 (×3): 50 mg via ORAL
  Filled 2015-08-22 (×3): qty 1

## 2015-08-22 MED ORDER — POTASSIUM CHLORIDE CRYS ER 20 MEQ PO TBCR
40.0000 meq | EXTENDED_RELEASE_TABLET | Freq: Two times a day (BID) | ORAL | Status: DC
  Administered 2015-08-22 – 2015-08-23 (×4): 40 meq via ORAL
  Filled 2015-08-22 (×3): qty 2
  Filled 2015-08-22: qty 4

## 2015-08-22 MED ORDER — OXYCODONE HCL 5 MG PO TABS
5.0000 mg | ORAL_TABLET | Freq: Four times a day (QID) | ORAL | Status: DC | PRN
  Administered 2015-08-22 – 2015-08-23 (×2): 5 mg via ORAL
  Filled 2015-08-22 (×2): qty 1

## 2015-08-22 MED ORDER — ONDANSETRON HCL 4 MG/2ML IJ SOLN
4.0000 mg | Freq: Four times a day (QID) | INTRAMUSCULAR | Status: DC | PRN
  Administered 2015-08-22: 4 mg via INTRAVENOUS
  Filled 2015-08-22: qty 2

## 2015-08-22 MED ORDER — LATANOPROST 0.005 % OP SOLN
1.0000 [drp] | Freq: Every day | OPHTHALMIC | Status: DC
  Administered 2015-08-23 – 2015-08-24 (×3): 1 [drp] via OPHTHALMIC
  Filled 2015-08-22: qty 2.5

## 2015-08-22 MED ORDER — HYDROCODONE-ACETAMINOPHEN 10-325 MG PO TABS
1.0000 | ORAL_TABLET | Freq: Two times a day (BID) | ORAL | Status: DC | PRN
  Administered 2015-08-22 – 2015-08-24 (×6): 1 via ORAL
  Filled 2015-08-22 (×6): qty 1

## 2015-08-22 MED ORDER — CEFAZOLIN SODIUM-DEXTROSE 2-4 GM/100ML-% IV SOLN
2.0000 g | INTRAVENOUS | Status: AC
  Administered 2015-08-22: 2 g via INTRAVENOUS

## 2015-08-22 MED ORDER — SODIUM CHLORIDE 0.9 % IR SOLN: 80.0000 mg | Status: DC

## 2015-08-22 MED ORDER — SODIUM CHLORIDE 0.9 % IR SOLN
Status: AC
  Filled 2015-08-22: qty 2

## 2015-08-22 MED ORDER — VITAMIN K1 10 MG/ML IJ SOLN
5.0000 mg | Freq: Once | INTRAVENOUS | Status: AC
  Administered 2015-08-22: 5 mg via INTRAVENOUS
  Filled 2015-08-22: qty 0.5

## 2015-08-22 MED ORDER — ACETAMINOPHEN 325 MG PO TABS
650.0000 mg | ORAL_TABLET | ORAL | Status: DC | PRN
  Filled 2015-08-22: qty 2

## 2015-08-22 MED ORDER — LIDOCAINE HCL (PF) 1 % IJ SOLN
INTRAMUSCULAR | Status: AC
  Filled 2015-08-22: qty 60

## 2015-08-22 MED ORDER — LIDOCAINE HCL (PF) 1 % IJ SOLN
INTRAMUSCULAR | Status: DC | PRN
  Administered 2015-08-22: 60 mL

## 2015-08-22 MED ORDER — FENTANYL CITRATE (PF) 100 MCG/2ML IJ SOLN
INTRAMUSCULAR | Status: DC | PRN
  Administered 2015-08-22: 25 ug via INTRAVENOUS

## 2015-08-22 MED ORDER — SODIUM CHLORIDE 0.9% FLUSH: 3.0000 mL | INTRAVENOUS | Status: DC | PRN

## 2015-08-22 MED ORDER — ONDANSETRON HCL 4 MG/2ML IJ SOLN: 4.0000 mg | Freq: Four times a day (QID) | INTRAMUSCULAR | Status: DC | PRN

## 2015-08-22 MED ORDER — SODIUM CHLORIDE 0.9 % IV SOLN
INTRAVENOUS | Status: DC
  Administered 2015-08-22: 13:00:00 via INTRAVENOUS

## 2015-08-22 MED ORDER — CEFAZOLIN SODIUM 1-5 GM-% IV SOLN
1.0000 g | Freq: Three times a day (TID) | INTRAVENOUS | Status: AC
  Administered 2015-08-23 (×2): 1 g via INTRAVENOUS
  Filled 2015-08-22 (×2): qty 50

## 2015-08-22 MED ORDER — CEFAZOLIN SODIUM-DEXTROSE 2-4 GM/100ML-% IV SOLN
INTRAVENOUS | Status: AC
  Filled 2015-08-22: qty 100

## 2015-08-22 MED ORDER — PIPERACILLIN-TAZOBACTAM 3.375 G IVPB
3.3750 g | Freq: Three times a day (TID) | INTRAVENOUS | Status: DC
  Administered 2015-08-22 – 2015-08-24 (×6): 3.375 g via INTRAVENOUS
  Filled 2015-08-22 (×9): qty 50

## 2015-08-22 MED ORDER — SODIUM CHLORIDE 0.9% FLUSH
3.0000 mL | Freq: Two times a day (BID) | INTRAVENOUS | Status: DC
  Administered 2015-08-22 – 2015-08-24 (×5): 3 mL via INTRAVENOUS

## 2015-08-22 MED ORDER — HEPARIN (PORCINE) IN NACL 2-0.9 UNIT/ML-% IJ SOLN
INTRAMUSCULAR | Status: AC
  Filled 2015-08-22: qty 500

## 2015-08-22 MED ORDER — ACETAMINOPHEN 325 MG PO TABS: 325.0000 mg | ORAL_TABLET | ORAL | Status: DC | PRN

## 2015-08-22 MED ORDER — SODIUM CHLORIDE 0.9 % IV SOLN: 250.0000 mL | INTRAVENOUS | Status: DC | PRN

## 2015-08-22 MED ORDER — ANASTROZOLE 1 MG PO TABS
1.0000 mg | ORAL_TABLET | Freq: Every day | ORAL | Status: DC
  Administered 2015-08-22 – 2015-08-25 (×4): 1 mg via ORAL
  Filled 2015-08-22 (×4): qty 1

## 2015-08-22 MED ORDER — PIPERACILLIN-TAZOBACTAM 3.375 G IVPB 30 MIN
3.3750 g | Freq: Once | INTRAVENOUS | Status: AC
  Administered 2015-08-22: 3.375 g via INTRAVENOUS
  Filled 2015-08-22: qty 50

## 2015-08-22 MED ORDER — FENTANYL CITRATE (PF) 100 MCG/2ML IJ SOLN
INTRAMUSCULAR | Status: AC
  Filled 2015-08-22: qty 2

## 2015-08-22 MED ORDER — CHLORHEXIDINE GLUCONATE 4 % EX LIQD
60.0000 mL | Freq: Once | CUTANEOUS | Status: AC
Start: 2015-08-22 — End: 2015-08-22
  Administered 2015-08-22: 4 via TOPICAL
  Filled 2015-08-22: qty 60

## 2015-08-22 MED ORDER — SODIUM CHLORIDE 0.9 % IV SOLN: Freq: Once | INTRAVENOUS | Status: DC

## 2015-08-22 MED ORDER — FUROSEMIDE 10 MG/ML IJ SOLN
60.0000 mg | Freq: Three times a day (TID) | INTRAMUSCULAR | Status: DC
  Administered 2015-08-22 – 2015-08-24 (×7): 60 mg via INTRAVENOUS
  Filled 2015-08-22 (×7): qty 6

## 2015-08-22 SURGICAL SUPPLY — 7 items
CABLE SURGICAL S-101-97-12 (CABLE) ×2 IMPLANT
LEAD CAPSURE NOVUS 45CM (Lead) ×2 IMPLANT
LEAD CAPSURE NOVUS 5076-52CM (Lead) ×2 IMPLANT
PAD DEFIB LIFELINK (PAD) ×2 IMPLANT
PPM ADVISA MRI DR A2DR01 (Pacemaker) ×2 IMPLANT
SHEATH CLASSIC 7F (SHEATH) ×4 IMPLANT
TRAY PACEMAKER INSERTION (PACKS) ×2 IMPLANT

## 2015-08-22 NOTE — Consult Note (Signed)
ELECTROPHYSIOLOGY CONSULT NOTE    Patient ID: Natasha Fletcher MRN: [id number], DOB/AGE: [birth year] 82 y.o.  Admit date: 08/21/2015 Date of Consult: @TODAY @  Primary Physician: Myriam Jacobson, MD Primary Cardiologist: Claiborne Billings Electrophysiologist: Curt Bears Requesting Physician: Angelena Form  Reason for Consultation: symptomatic bradycardia  HPI:  Natasha Fletcher is a 82 y.o. female with a past medical history significant for hypertension, GERD, prior right breast cancer, diabetes, chronic diastolic heart failure, and chronic indwelling foley.  She has been seen by Dr Curt Bears for recurrent atrial arrhythmias and underwent cardioversion in April of this year.  For the last 3 days prior to admission, she has had worsening shortness of breath and fatigue.  She presented to the ER where she was found to be in junctional bradycardia.  EP has been asked to evaluate for treatment options.  Echo 01/2015 demonstrated EF 55-60%, LA 34.   She currently says that she is still fatigued and short of breath with exertion. She has not had recent chest pain, fevers, chills, nausea or vomiting.   Past Medical History  Diagnosis Date  . Hypertension   . Asthma   . Glaucoma   . Incontinence   . Wears dentures   . Wears glasses   . Thyroid disease     "something years ago"  . GERD (gastroesophageal reflux disease)   . Cancer (HCC)     rt breast  . Edema leg   . Fast breathing     "because of pill"  . Pneumonia     h/o younger  . Arthritis     "all over"  . S/P total knee arthroplasty   . Glaucoma   . Swelling     bilateral feet/ legs, more left.  . Atrial fibrillation ([place])   . Diabetes mellitus without complication ([place])   . UTI (urinary tract infection) 02/12/2015  . CHF (congestive heart failure) [facility])      Surgical History:  Past Surgical History  Procedure Laterality Date  . Total hip arthroplasty Bilateral   . Shoulder surgery Left   . Appendectomy    . Carpal tunnel release  Bilateral   . Tonsillectomy    . Colonoscopy    . Abdominal hysterectomy    . Eye surgery      both cataracts  . Breast biopsy  04/27/2011    Procedure: BREAST BIOPSY WITH NEEDLE LOCALIZATION;  Surgeon: Edward Jolly, MD;  Location: [place];  Service: General;  Laterality: Right;  right needle localized breast lumpectomy   . Breast lumpectomy  04/27/11    right breast by [facility]  . Total knee arthroplasty Right 02/19/2013    Procedure: RIGHT TOTAL KNEE ARTHROPLASTY;  Surgeon: Gearlean Alf, MD;  [facility];  Service: Orthopedics;  Laterality: Right;  . Total knee arthroplasty Left 10/15/2013    Procedure: LEFT TOTAL KNEE ARTHROPLASTY;  Surgeon: Gearlean Alf, MD;  Location: [facility];  Service: Orthopedics;  Laterality: Left;  . Cardioversion N/A 02/01/2014    Procedure: CARDIOVERSION;  Surgeon: Candee Furbish, MD;  Location: [facility] ENDOSCOPY;  Service: Cardiovascular;  Laterality: N/A;  . Tee without cardioversion N/A 02/01/2014    Procedure: TRANSESOPHAGEAL ECHOCARDIOGRAM (TEE);  Surgeon: Candee Furbish, MD;  Location: [facility] ENDOSCOPY;  Service: Cardiovascular;  Laterality: N/A;  . Cardioversion N/A 06/04/2015    Procedure: CARDIOVERSION;  Surgeon: Dorothy Spark, MD;  Location: [place];  Service: Cardiovascular;  Laterality: N/A;  . Cardioversion N/A 07/14/2015    Procedure: CARDIOVERSION;  Surgeon: Thayer Headings, MD;  Location: [place];  Service: Cardiovascular;  Laterality: N/A;     Prescriptions prior to admission  Medication Sig Dispense Refill Last Dose  . amiodarone (PACERONE) 200 MG tablet Take 1 tablet (200 mg total) by mouth daily. 30 tablet 3 08/21/2015 at Unknown time  . anastrozole (ARIMIDEX) 1 MG tablet TAKE 1 TABLET BY MOUTH EVERY DAY 30 tablet 2 08/21/2015 at Unknown time  . diltiazem (CARDIZEM CD) 120 MG 24 hr capsule Take 1 capsule (120 mg total) by mouth daily. 90 capsule 3 08/21/2015 at Unknown time  . furosemide (LASIX) 20 MG tablet Take 40  mg by mouth 2 (two) times daily.    08/21/2015 at Unknown time  . HYDROcodone-acetaminophen (NORCO) 10-325 MG tablet Take 1 tablet by mouth 2 (two) times daily as needed (leg pain).   0 08/21/2015 at Unknown time  . latanoprost (XALATAN) 0.005 % ophthalmic solution Place 1 drop into both eyes at bedtime.   08/21/2015 at Unknown time  . meclizine (ANTIVERT) 25 MG tablet Take 12.5 mg by mouth 3 (three) times daily. Reported on 06/03/2015   08/21/2015 at Unknown time  . metoprolol (LOPRESSOR) 25 MG tablet Take 1 tablet (25 mg total) by mouth 2 (two) times daily. 60 tablet 6 08/21/2015 at 1800  . traZODone (DESYREL) 50 MG tablet Take 50 mg by mouth at bedtime.  0 08/21/2015 at Unknown time  . warfarin (COUMADIN) 5 MG tablet Take 1 tablet by mouth daily or as directed by coumadin clinic (Patient taking differently: Take 5 mg by mouth daily at 12 noon. or as directed by coumadin clinic) 90 tablet 0 08/21/2015 at 0930  . amiodarone (PACERONE) 200 MG tablet Take 1 tablet (200 mg total) by mouth 2 (two) times daily. For two weeks (Patient not taking: Reported on 07/11/2015) 28 tablet 0 Not Taking at Unknown time  . feeding supplement (BOOST / RESOURCE BREEZE) LIQD Take 1 Container by mouth 3 (three) times daily between meals. (Patient not taking: Reported on 07/11/2015) 90 Container 0 Not Taking at Unknown time    Inpatient Medications:  . anastrozole  1 mg Oral Daily  . furosemide  60 mg Intravenous Q8H  . latanoprost  1 drop Both Eyes QHS  . phytonadione (VITAMIN K) IV  5 mg Intravenous Once  . piperacillin-tazobactam (ZOSYN)  IV  3.375 g Intravenous Q8H  . potassium chloride  40 mEq Oral BID  . sodium chloride flush  3 mL Intravenous Q12H  . traZODone  50 mg Oral QHS    Allergies:  Allergies  Allergen Reactions  . Lactose Intolerance (Gi) Other (See Comments)    Stomach pain  . Milk-Related Compounds Other (See Comments)    Upset stomach   . Sulfa Antibiotics Itching    Social History   Social  History  . Marital Status: Single    Spouse Name: N/A  . Number of Children: N/A  . Years of Education: N/A   Occupational History  . Not on file.   Social History Main Topics  . Smoking status: Former Smoker -- 0.00 packs/day for 30 years    Types: Cigarettes    Quit date: 04/21/1975  . Smokeless tobacco: Never Used  . Alcohol Use: Yes     Comment: occasionally  . Drug Use: No  . Sexual Activity: Not Currently   Other Topics Concern  . Not on file   Social History Narrative   From SNF, getting PT and using cane.  Family History  Problem Relation Age of Onset  . Heart disease Mother   . Stroke Father   . Heart attack Neg Hx   . Stroke Mother   . Stroke Brother   . Hypertension Mother   . Hypertension Father      Review of Systems: All other systems reviewed and are otherwise negative except as noted above.  Physical Exam: Filed Vitals:   08/22/15 0340 08/22/15 0456 08/22/15 0730 08/22/15 0950  BP: 188/78 158/72  104/46  Pulse: 54 46  36  Temp:  97.9 F (36.6 C) 97.7 F (36.5 C)   TempSrc:  Oral Oral   Resp: 21 18  13   Height:      Weight:      SpO2: 98% 96%  99%    GEN- The patient is elderly and chronically ill appearing, alert and oriented x 3 today.   HEENT: normocephalic, atraumatic; sclera clear, conjunctiva pink; hearing intact; oropharynx clear; neck supple  Lungs- Clear to ausculation bilaterally, normal work of breathing.  No wheezes, rales, rhonchi Heart- Bradycardic regular rate and rhythm  GI- soft, non-tender, non-distended, bowel sounds present  Extremities- no clubbing, cyanosis, or edema  MS- no significant deformity or atrophy Skin- warm and dry, no rash or lesion Psych- euthymic mood, full affect Neuro- strength and sensation are intact  Labs:   Lab Results  Component Value Date   WBC 5.7 08/22/2015   HGB 10.7* 08/22/2015   HCT 34.6* 08/22/2015   MCV 85.2 08/22/2015   PLT 256 08/22/2015    Recent Labs Lab  08/22/15 0244  NA 141  K 4.4  CL 104  CO2 26  BUN 32*  CREATININE 1.39*  CALCIUM 9.7  GLUCOSE 102*      Radiology/Studies: Dg Chest Portable 1 View  08/21/2015  CLINICAL DATA:  Initial valuation for 3 day history of acute shortness of breath. Lower extremity swelling. EXAM: PORTABLE CHEST 1 VIEW COMPARISON:  Prior study from 05/09/2015. FINDINGS: To particular but later pat it is overlie the left chest. Moderate cardiomegaly is stable. Mediastinal silhouette within normal limits. Diffuse pulmonary vascular congestion with indistinctness of the nursing show markings, consistent with moderate diffuse pulmonary edema. Small bilateral pleural effusions present. Findings consistent with acute CHF exacerbation. No definite focal infiltrates. No pneumothorax. Diffuse osteopenia noted.  No acute osseous abnormality. IMPRESSION: Cardiomegaly with moderate diffuse pulmonary edema and bilateral pleural effusions, consistent with acute CHF exacerbation. Electronically Signed   By: Jeannine Boga M.D.   On: 08/21/2015 23:48    [id number] bradycardia, rate 34  TELEMETRY: junctional bradycardia in the 30's  Assessment/Plan: 1.  Tachy/brady syndrome The patient has tachybrady syndrome and meets indication for pacemaker implant.  Her INR is elevated, Zineb Glade give vitamin k and reassess this afternoon. Risks, benefits to pacemaker implantation reviewed with the patient and her brother who wish to proceed. Continue Warfarin long term for CHADS2VASC of 5  2.  Chronic diastolic heart failure IV Lasix ordered Richard Ritchey likely improve post pacing  3.  UTI Zosyn ordered   Signed, Chanetta Marshall, NP 08/22/2015 10:35 AM   I have seen and examined this patient with Chanetta Marshall.  Agree with above, note added to reflect my findings.  On exam, bradycardic rhythm, no murmurs, lungs clear.  Presented today with junctional bradycardia.  Has had tachycardia in the past requiring cardioversion.  Discussed  pacemaker placement.  Risks and benefits discussed.  Patient has agreed to the pacemaker placement.  Her INR is elevated, have  given vitamin K to hopefully bring it down to a more acceptable level.  Beatris Belen M. Almalik Weissberg MD 08/22/2015 10:56 AM

## 2015-08-22 NOTE — Progress Notes (Addendum)
Patient Profile: 82 yo with history of atrial fibrillation and flutter, chronic diastolic CHF, HTN presented with severe dyspnea for several days and was found to be in junctional bradycardia with volume overload.  Subjective: Feels dizzy. She has SOB with exertion but no resting dyspnea.   Objective: Vital signs in last 24 hours: Temp:  [97.7 F (36.5 C)-98 F (36.7 C)] 97.7 F (36.5 C) (05/19 0730) Pulse Rate:  [34-56] 46 (05/19 0456) Resp:  [15-23] 18 (05/19 0456) BP: (114-188)/(50-79) 158/72 mmHg (05/19 0456) SpO2:  [95 %-100 %] 96 % (05/19 0456) Weight:  [138 lb 6.4 oz (62.778 kg)-141 lb (63.957 kg)] 138 lb 6.4 oz (62.778 kg) (05/19 0225) Last BM Date: 08/20/15  Intake/Output from previous day: 05/18 0701 - 05/19 0700 In: 260 [P.O.:210; I.V.:50] Out: 2125 [Urine:2125] Intake/Output this shift: Total I/O In: 120 [P.O.:120] Out: -   Medications Current Facility-Administered Medications  Medication Dose Route Frequency Provider Last Rate Last Dose  . 0.9 %  sodium chloride infusion  250 mL Intravenous PRN Larey Dresser, MD      . acetaminophen (TYLENOL) tablet 650 mg  650 mg Oral Q4H PRN Larey Dresser, MD      . anastrozole (ARIMIDEX) tablet 1 mg  1 mg Oral Daily Larey Dresser, MD      . furosemide (LASIX) injection 60 mg  60 mg Intravenous Q8H Larey Dresser, MD   60 mg at 08/22/15 0546  . HYDROcodone-acetaminophen (NORCO) 10-325 MG per tablet 1 tablet  1 tablet Oral BID PRN Larey Dresser, MD   1 tablet at 08/22/15 0545  . latanoprost (XALATAN) 0.005 % ophthalmic solution 1 drop  1 drop Both Eyes QHS Larey Dresser, MD      . ondansetron [facility]) injection 4 mg  4 mg Intravenous Q6H PRN Larey Dresser, MD   4 mg at 08/22/15 0339  . piperacillin-tazobactam (ZOSYN) IVPB 3.375 g  3.375 g Intravenous Q8H Franky Macho, RPH   3.375 g at 08/22/15 [id number]  . potassium chloride SA (K-DUR,KLOR-CON) CR tablet 40 mEq  40 mEq Oral BID Larey Dresser, MD   40 mEq at  08/22/15 [id number]  . sodium chloride flush (NS) 0.9 % injection 3 mL  3 mL Intravenous Q12H Larey Dresser, MD   3 mL at 08/22/15 0547  . sodium chloride flush (NS) 0.9 % injection 3 mL  3 mL Intravenous PRN Larey Dresser, MD      . traZODone (DESYREL) tablet 50 mg  50 mg Oral QHS Larey Dresser, MD        PE: General appearance: alert, cooperative, no distress and elderly and frail Neck: elevated JVD Lungs: clear to auscultation bilaterally Heart: Reg Rhythm, brady rate Extremities: trace bilateral LEE Pulses: 2+ and symmetric Skin: warm and dry Neurologic: Grossly normal  Lab Results:   Recent Labs  08/21/15 2322 08/21/15 2331 08/22/15 0244  WBC 5.7  --  5.7  HGB 10.3* 12.9 10.7*  HCT 32.9* 38.0 34.6*  PLT 260  --  256   BMET  Recent Labs  08/21/15 2322 08/21/15 2331 08/22/15 0244  NA 136 139 141  K 4.3 4.4 4.4  CL 103 103 104  CO2 23  --  26  GLUCOSE 130* 116* 102*  BUN 34* 39* 32*  CREATININE 1.51* 1.40* 1.39*  CALCIUM 9.3  --  9.7   PT/INR  Recent Labs  08/21/15 2322 08/22/15 0244  LABPROT 45.2* 41.5*  INR 5.00* 4.49*   Cardiac Panel (last 3 results) No results for input(s): CKTOTAL, CKMB, TROPONINI, RELINDX in the last 72 hours.   Assessment/Plan  Active Problems:   Bradycardia  1. Bradycardia: Junctional rhythm in 30s alternating with NSR in 50s. Suspect sick sinus syndrome. She is symptomatic with dizziness. She was on diltiazem CD, metoprolol, and amiodarone as an outpatient. BP is preserved. She has been markedly symptomatic for 2-3 days. Plan is d/c all AV nodal blocking agents and to observer.  If no improvement off nodal blockers, will need EP consult for PPM. She is followed by Dr. Curt Bears. Would be difficult to do PPM today given INR of 5. Coumadin is on hold ? Temporary pacer in the interim.  K is stable at 4.4. We will also check a TSH and Mg level.  There is no documented h/o CAD. No prior stress test nor caths to review. ?  Underlying coronary ischemia. MD to assess and will provide further recommendations.   2. H/o Atrial fibrillation and atrial flutter: Recent TEE-guided DCCV from aflutter to NSR. Now in junctional bradycardia. Dr Curt Bears has seen, recommended against catheter ablation of either flutter or fibrillation.  - INR 5, hold warfarin.  - Holding nodal blockers including amiodarone.   3. Acute on chronic diastolic CHF: Suspect CHF exacerbation in setting of profound bradycardia. Volume overload on exam.  - Lasix 60 mg IV bid, strict I/Os. - Echo   4. UTI: Lower abdominal pain and dirty UA, has chronic foley. Will cover with Zosyn given possible complicated UTI until cultures return.    LOS: 0 days    Natasha Fletcher 08/22/2015 9:46 AM  I have personally seen and examined this patient with Lyda Jester, PA-C. I agree with the assessment and plan as outlined above. She has tachy brady syndrome. Sinus brady/junctional on admit. Beta blocker, amio and Cardizem on hold. Some dizziness last few days with dyspnea but no syncope. Volume overload on exam. She is being diuresed with IV Lasix. I will ask EP to see this am to discuss perm pacemaker. INR is 4.5 so may need Vit K. DCCV was 5 weeks ago.   I spent 30 minutes of critical care time this am.   Lauree Chandler 08/22/2015 10:24 AM

## 2015-08-22 NOTE — ED Notes (Signed)
Cardiologist in room

## 2015-08-22 NOTE — Progress Notes (Signed)
Pharmacy Antibiotic Note  Natasha Fletcher is a 82 y.o. female admitted on 08/21/2015 with UTI.  Pharmacy has been consulted for Zosyn dosing. Zosyn 3.375gm IV given in ED ~0030.  Plan: Zosyn 3.375gm IV q8h - doses over 4 hours Will f/u micro data, renal function, and pt's clinical condition   Height: 5\' 6"  (167.6 cm) Weight: 138 lb 6.4 oz (62.778 kg) IBW/kg (Calculated) : 59.3  Temp (24hrs), Avg:97.9 F (36.6 C), Min:97.8 F (36.6 C), Max:98 F (36.7 C)   Recent Labs Lab 08/21/15 2322 08/21/15 2329 08/21/15 2331  WBC 5.7  --   --   CREATININE 1.51*  --  1.40*  LATICACIDVEN  --  1.04  --     Estimated Creatinine Clearance: 26.5 mL/min (by C-G formula based on Cr of 1.4).    Allergies  Allergen Reactions  . Lactose Intolerance (Gi) Other (See Comments)    Stomach pain  . Milk-Related Compounds Other (See Comments)    Upset stomach   . Sulfa Antibiotics Itching    Antimicrobials this admission: 5/19 Zosyn >>   Microbiology results: 5/18 UCx:     Thank you for allowing pharmacy to be a part of this patient's care.  Sherlon Handing, PharmD, BCPS Clinical pharmacist, pager [phone] 08/22/2015 2:26 AM

## 2015-08-22 NOTE — Progress Notes (Signed)
No call back or orders received as of yet from cardiology pertaining to patient's foley catheter leaking and unable to obtain accurate output.  Cardiology paged for a second time with this information.

## 2015-08-22 NOTE — Progress Notes (Signed)
RN received call back from Dr. Aundra Dubin to discontinue foley catheter patient arrived to hospital with and insert new foley cathter.

## 2015-08-22 NOTE — H&P (Signed)
Physician History and Physical    Natasha Fletcher MRN: [id number] DOB/AGE: 1926-01-08 82 y.o. Admit date: 08/21/2015  Primary Cardiologist: Kelly/Camnitz  HPI: 82 yo with history of atrial fibrillation and flutter, chronic diastolic CHF, HTN presented with severe dyspnea for several days and was found to be in junctional bradycardia with volume overload.  Atrial fibrillation was diagnosed 2015, had DCCV 10/15.  She was in atrial flutter in 10/16, cardioverted.  In atrial fibrillation in 3/17, cardioverted.  Back in atrial flutter in 4/17, TEE-guided DCCV after starting amiodarone. Recently called in to office with lightheadedness and was told to decrease her metoprolol.   She says that she has been more short of breath for 2-3 weeks.  She is dyspneic walking short distances or going up stairs.  For 2-3 days, she has been very dizzy with standing but has not fallen.  She is having a hard time getting around her house.  No chest pain.  No syncope.    In the ER, HR was in the 30s with a junctional bradycardia. SBP in 140s-150s.  As I watched her telemetry, she had occasional runs of NSR in the 50s, then back to junctional bradycardia. CXR showed pulmonary edema. She also has lower abdominal pain and a dirty UA.   PMH: 1. Atrial fibrillation and atrial flutter: Paroxysmal.  Diagnosed 2015, had DCCV 10/15.  In atrial flutter in 10/16, cardioverted.  In atrial fibrillation in 3/17, cardioverted.  Back in atrial flutter in 4/17, TEE-guided DCCV after starting amiodarone.  2. Chronic diastolic CHF: Echo ([id number]) with EF 55-60%, severe LVH.  3. HTN 4. Asthma 5. Recurrent UTIs 6. GERD 7. OA 8. DM  Review of systems complete and found to be negative unless listed above   Family History  Problem Relation Age of Onset  . Heart disease Mother   . Stroke Father   . Heart attack Neg Hx   . Stroke Mother   . Stroke Brother   . Hypertension Mother   . Hypertension Father     Social History    Social History  . Marital Status: Single    Spouse Name: N/A  . Number of Children: N/A  . Years of Education: N/A   Occupational History  . Not on file.   Social History Main Topics  . Smoking status: Former Smoker -- 0.00 packs/day for 30 years    Types: Cigarettes    Quit date: 04/21/1975  . Smokeless tobacco: Never Used  . Alcohol Use: Yes     Comment: occasionally  . Drug Use: No  . Sexual Activity: Not Currently   Other Topics Concern  . Not on file   Social History Narrative   From SNF, getting PT and using cane.      Physical Exam: Blood pressure 146/79, pulse 34, temperature 97.8 F (36.6 C), temperature source Oral, resp. rate 16, height 5\' 6"  (1.676 m), weight 141 lb (63.957 kg), SpO2 99 %.  General: NAD Neck: JVP 10-12 cm, no thyromegaly or thyroid nodule.  Lungs: Clear to auscultation bilaterally with normal respiratory effort. CV: Nondisplaced PMI.  Heart regular S1/S2, no S3/S4, no murmur.  Trace edema at ankles.  No carotid bruit.  Normal pedal pulses.  Abdomen: Soft, nontender, no hepatosplenomegaly, no distention.  Skin: Intact without lesions or rashes.  Neurologic: Alert and oriented x 3.  Psych: Normal affect. Extremities: No clubbing or cyanosis.  HEENT: Normal.   Labs:   Lab Results  Component Value Date  WBC 5.7 08/21/2015   HGB 12.9 08/21/2015   HCT 38.0 08/21/2015   MCV 85.7 08/21/2015   PLT 260 08/21/2015    Recent Labs Lab 08/21/15 2322 08/21/15 2331  NA 136 139  K 4.3 4.4  CL 103 103  CO2 23  --   BUN 34* 39*  CREATININE 1.51* 1.40*  CALCIUM 9.3  --   GLUCOSE 130* 116*  TnI 0.08   Radiology: - CXR: Pulmonary edema  EKG: slow junctional rhythm at 36, iRBBB, lateral Qs (lateral changes compared to prior ECG)  ASSESSMENT AND PLAN: 82 yo with history of atrial fibrillation and flutter, chronic diastolic CHF, HTN presented with severe dyspnea for several days and was found to be in junctional bradycardia with volume  overload.  1. Bradycardia: Junctional rhythm in 30s alternating with NSR in 50s.  Suspect sick sinus syndrome.  She is on diltiazem CD, metoprolol, and amiodarone.  BP is preserved.  She has been markedly symptomatic for 2-3 days.  - Hold nodal blocking agents and observe.  - If no improvement off nodal blockers, will need PCM.  - Will keep in step down for now.  2. Atrial fibrillation and atrial flutter: Recent TEE-guided DCCV from aflutter to NSR.  Now in junctional bradycardia.  Dr Curt Bears has seen, recommended against catheter ablation of either flutter or fibrillation.  - INR 5, hold warfarin.  - Holding nodal blockers including amiodarone.  3. Acute on chronic diastolic CHF: Suspect CHF exacerbation in setting of profound bradycardia.  Volume overload on exam.  - Lasix 60 mg IV bid, strict I/Os. - Echo  4. UTI: Lower abdominal pain and dirty UA, has chronic foley.  Will cover with Zosyn given possible complicated UTI until cultures return.   Signed: Loralie Champagne 08/22/2015, 12:15 AM

## 2015-08-22 NOTE — Progress Notes (Signed)
  Echocardiogram 2D Echocardiogram has been performed.  [name] 08/22/2015, 10:53 AM

## 2015-08-22 NOTE — Progress Notes (Signed)
Nutrition Brief Note  Patient identified on the Malnutrition Screening Tool (MST) Report. Per review of usual weights below, patient with no significant weight loss recently.   Wt Readings from Last 15 Encounters:  08/22/15 138 lb 6.4 oz (62.778 kg)  07/14/15 137 lb (62.143 kg)  06/24/15 137 lb 9.6 oz (62.415 kg)  06/19/15 136 lb 8 oz (61.916 kg)  06/04/15 139 lb (63.05 kg)  06/03/15 139 lb 8 oz (63.277 kg)  05/28/15 141 lb (63.957 kg)  05/14/15 146 lb (66.225 kg)  04/18/15 146 lb 9.6 oz (66.497 kg)  03/18/15 138 lb 0.1 oz (62.6 kg)  02/19/15 137 lb 6.4 oz (62.324 kg)  02/18/15 140 lb (63.504 kg)  02/17/15 132 lb 12.2 oz (60.22 kg)  01/17/15 143 lb (64.864 kg)  01/07/15 145 lb (65.772 kg)    Body mass index is 22.35 kg/(m^2). Patient meets criteria for normal weight based on current BMI.   Current diet order is NPO for pacemaker placement today. Labs and medications reviewed.   No nutrition interventions warranted at this time. If nutrition issues arise, please consult RD.   Molli Barrows, RD, LDN, [place] Pager [phone] After Hours Pager [phone]

## 2015-08-22 NOTE — Progress Notes (Signed)
PT Cancellation Note  Patient Details Name: Natasha Fletcher MRN: [id number] DOB: 1926-03-30   Cancelled Treatment:    Reason Eval/Treat Not Completed: Medical issues which prohibited therapy.  Pt due to get pacemaker today.  Per chart, she has symptomatic bradycardia.  Pt will likely preform better with PT post procedure.  We will check back tomorrow.   Thanks,    Barbarann Ehlers. [place], [place], DPT [phone]   08/22/2015, 11:50 AM

## 2015-08-22 NOTE — Progress Notes (Signed)
Patient's foley catheter is leaking urine.  RN paged cardiology asking if foley catheter can be changed.  Awaiting cardiology response.

## 2015-08-23 ENCOUNTER — Inpatient Hospital Stay (HOSPITAL_COMMUNITY): Payer: Medicare Other

## 2015-08-23 DIAGNOSIS — I5021 Acute systolic (congestive) heart failure: Secondary | ICD-10-CM

## 2015-08-23 LAB — CBC
HCT: 33.3 % — ABNORMAL LOW (ref 36.0–46.0)
Hemoglobin: 10.4 g/dL — ABNORMAL LOW (ref 12.0–15.0)
MCH: 27 pg (ref 26.0–34.0)
MCHC: 31.2 g/dL (ref 30.0–36.0)
MCV: 86.5 fL (ref 78.0–100.0)
Platelets: 224 10*3/uL (ref 150–400)
RBC: 3.85 MIL/uL — ABNORMAL LOW (ref 3.87–5.11)
RDW: 16.5 % — AB (ref 11.5–15.5)
WBC: 6.4 10*3/uL (ref 4.0–10.5)

## 2015-08-23 LAB — PROTIME-INR
INR: 1.43 (ref 0.00–1.49)
Prothrombin Time: 17.5 seconds — ABNORMAL HIGH (ref 11.6–15.2)

## 2015-08-23 LAB — PREPARE FRESH FROZEN PLASMA
UNIT DIVISION: 0
Unit division: 0

## 2015-08-23 MED ORDER — OXYCODONE HCL 5 MG PO TABS
5.0000 mg | ORAL_TABLET | ORAL | Status: DC | PRN
  Administered 2015-08-23 – 2015-08-25 (×8): 5 mg via ORAL
  Filled 2015-08-23 (×8): qty 1

## 2015-08-23 MED ORDER — NITROFURANTOIN MONOHYD MACRO 100 MG PO CAPS: 100.0000 mg | ORAL_CAPSULE | Freq: Two times a day (BID) | ORAL | Status: DC

## 2015-08-23 NOTE — Plan of Care (Signed)
Problem: Education: Goal: Knowledge of [place] [facility] Education information/materials will improve Outcome: Progressing Patient aware of plan of care.  Patient is engaged in her care and has had questions throughout this shift. RN answered each question.  RN reviewed left arm restrictions with patient due to her recent pacemaker insertion.  Patient stated understanding.  RN has provided medication education on all medications administered thus far this shift.  Patient stated understanding.  Patient has complained of pain this shift, RN administered PRN medication per MD order (see MAR and flowsheet for detailed information).

## 2015-08-23 NOTE — Evaluation (Addendum)
Physical Therapy Evaluation Patient Details Name: Natasha Fletcher MRN: [id number] DOB: [birth year] Today's Date: 08/23/2015   History of Present Illness  82 y.o. female admitted to [facility] on 08/21/15 for symptomatic bradycardia s/p pacemaker on 08/22/15.  Pt with significant PMHx of HTN, R breast CA, bil TKA, A-fib, DM, CHF, bil THA, L shoulder surgery, and multiple cardioversions.    Clinical Impression  Pt was able to get up out of bed and walk a short distance with her RW into the hallway.  She has really not been up since admission and has gotten stiff and weak. She reports she has a brother in from out of town that is going to be here until the end of June that can prove physical assist (24/7 if needed) and if this is indeed the case, she will be ok to go home after PT sees her to attempt stairs tomorrow (08/24/15) with brother's assist and HHPT f/u at discharge.   PT to follow acutely for deficits listed below.       Follow Up Recommendations Home health PT;Supervision for mobility/OOB (24/7 assist for first 2-3 days, then PRN, HHOT, aide)    Equipment Recommendations  None recommended by PT    Recommendations for Other Services OT consult    Precautions / Restrictions Precautions Precautions: Fall;ICD/Pacemaker Precaution Comments: Pt weak and painful on her feet      Mobility  Bed Mobility Overal bed mobility: Needs Assistance Bed Mobility: Supine to Sit     Supine to sit: Min assist     General bed mobility comments: Min assist to support trunk to get to EOB.   Transfers Overall transfer level: Needs assistance Equipment used: Rolling walker (2 wheeled) Transfers: Sit to/from Stand Sit to Stand: Min assist         General transfer comment: Min assist to support trunk during transitions.  Min assist to lower to chair with controlled descent.  Verbal cues for safe hand placement.   Ambulation/Gait Ambulation/Gait assistance: Min assist Ambulation Distance (Feet): 25  Feet Assistive device: Rolling walker (2 wheeled) Gait Pattern/deviations: Step-through pattern;Trunk flexed Gait velocity: decreased Gait velocity interpretation: Below normal speed for age/gender General Gait Details: Pt needed min assist for support over weak and shaky legs, did better the further we went as pt needed to work out some pain and stiffness from being in the bed for a few days.           Balance Overall balance assessment: Needs assistance Sitting-balance support: Feet supported;No upper extremity supported Sitting balance-Leahy Scale: Fair     Standing balance support: Bilateral upper extremity supported Standing balance-Leahy Scale: Poor                               Pertinent Vitals/Pain Pain Assessment: Faces Faces Pain Scale: Hurts whole lot Pain Location: left arm/incisional, bil lower legs (heels) and R posterior knee Pain Descriptors / Indicators: Aching;Burning Pain Intervention(s): Limited activity within patient's tolerance;Monitored during session;Repositioned    Home Living Family/patient expects to be discharged to:: Private residence Living Arrangements: Other relatives (sister with dementia) Available Help at Discharge: Family;Available 24 hours/day (brother here until the end of June to help) Type of Home: House Home Access: Stairs to enter Entrance Stairs-Rails:  (the 4 steps have rails, the one doesn't) Entrance Stairs-Number of Steps: 1 (at the back 4 at the front with rails. ) Home Layout: Multi-level (tri level (split level)) Home Equipment:  Walker - 2 wheels;Cane - single point;Bedside commode;Shower seat      Prior Function Level of Independence: Needs assistance   Gait / Transfers Assistance Needed: Pt uses a cane for community access  ADL's / Homemaking Assistance Needed: I with ADLs, but family perform homemaking tasks.          Hand Dominance   Dominant Hand: Right    Extremity/Trunk Assessment   Upper  Extremity Assessment: LUE deficits/detail       LUE Deficits / Details: reviewed left arm pacemaker precautions, sling removed for mobility and gait   Lower Extremity Assessment: Generalized weakness (and pain from being in bed)      Cervical / Trunk Assessment: Normal  Communication   Communication: HOH  Cognition Arousal/Alertness: Awake/alert Behavior During Therapy: WFL for tasks assessed/performed Overall Cognitive Status: Within Functional Limits for tasks assessed                         Exercises General Exercises - Upper Extremity Elbow Flexion: AROM;Left;10 reps (encouraged to decrease stiffness) General Exercises - Lower Extremity Ankle Circles/Pumps: AROM;Both;20 reps      Assessment/Plan    PT Assessment Patient needs continued PT services  PT Diagnosis Difficulty walking;Abnormality of gait;Generalized weakness;Acute pain   PT Problem List Decreased strength;Decreased activity tolerance;Decreased balance;Decreased mobility;Decreased knowledge of use of DME;Decreased knowledge of precautions;Pain  PT Treatment Interventions DME instruction;Gait training;Stair training;Functional mobility training;Therapeutic activities;Therapeutic exercise;Balance training;Neuromuscular re-education;Patient/family education   PT Goals (Current goals can be found in the Care Plan section) Acute Rehab PT Goals Patient Stated Goal: to go home PT Goal Formulation: With patient Time For Goal Achievement: 09/06/15 Potential to Achieve Goals: Good    Frequency Min 3X/week    End of Session Equipment Utilized During Treatment: Gait belt Activity Tolerance: Patient limited by pain;Patient limited by fatigue Patient left: in chair;with call bell/phone within reach;with chair alarm set Nurse Communication: Mobility status         Time: 1330-1411 PT Time Calculation (min) (ACUTE ONLY): 41 min   Charges:   PT Evaluation $PT Eval Moderate Complexity: 1 Procedure PT  Treatments $Gait Training: 8-22 mins $Therapeutic Activity: 8-22 mins        Charlottie Peragine B. [place], [place], DPT [phone]   08/23/2015, 2:40 PM

## 2015-08-23 NOTE — Progress Notes (Signed)
    Notified by RN patient doe snot feel like she ready ready to go home. Upon asking her why she reports, "I just feel weak." Secondly, she does report SOB. BP from 5/19 was 1225. Two days prior BNP was 1138. Lastly, she notes her neck pain. Discussed case with Dr. Curt Bears, MD who states ok for patient to stay for today, undergo some further diuresis, and have patient ambulate with PT or RN. She may need home PT upon discharge. She lives in a [place] with her bedroom and bathroom being downstairs and kitchen being upstairs. Will continue IV Lasix 60 mg q 8 hours for today with KCl repletion. Renal function is improving. Place consult for PT to ambulate.   Received notification from pharmacy patient should not be discharged on [place] given her age and renal function. Pharmacy recommends Ceftin 250 mg bid for 5 days. This change will be made upon patient's discharge. Appreciate pharmacy help.

## 2015-08-23 NOTE — Progress Notes (Signed)
SUBJECTIVE: The patient is doing well today.  At this time, she denies chest pain, shortness of breath.  She is having neck pain that is worse since yesterday.  Not worse with deep breathing but potentially worse with laying flat.    . sodium chloride   Intravenous Once  . anastrozole  1 mg Oral Daily  .  ceFAZolin (ANCEF) IV  1 g Intravenous Q8H  . furosemide  60 mg Intravenous Q8H  . latanoprost  1 drop Both Eyes QHS  . piperacillin-tazobactam (ZOSYN)  IV  3.375 g Intravenous Q8H  . potassium chloride  40 mEq Oral BID  . sodium chloride flush  3 mL Intravenous Q12H  . traZODone  50 mg Oral QHS      OBJECTIVE: Physical Exam: Filed Vitals:   08/22/15 2338 08/22/15 2348 08/23/15 0013 08/23/15 0509  BP: 129/67 133/70 130/67 129/74  Pulse: 59 59 60 60  Temp: 98.6 F (37 C) 98.2 F (36.8 C)  97.9 F (36.6 C)  TempSrc: Oral Oral  Oral  Resp: 17 17 16 12   Height:      Weight:    128 lb 1.6 oz (58.106 kg)  SpO2: 96% 96% 95% 97%    Intake/Output Summary (Last 24 hours) at 08/23/15 0824 Last data filed at 08/23/15 [id number]  Gross per 24 hour  Intake   1185 ml  Output   5225 ml  Net  -4040 ml    Telemetry reveals sinus rhythm  GEN- The patient is well appearing, alert and oriented x 3 today.   Head- normocephalic, atraumatic Eyes-  Sclera clear, conjunctiva pink Ears- hearing intact Oropharynx- clear Neck- supple, no JVP Lymph- no cervical lymphadenopathy Lungs- Clear to ausculation bilaterally, normal work of breathing Heart- Regular rate and rhythm, no murmurs, rubs or gallops, PMI not laterally displaced GI- soft, NT, ND, + BS Extremities- no clubbing, cyanosis, or edema Skin- no rash or lesion Psych- euthymic mood, full affect Neuro- strength and sensation are intact  LABS: Basic Metabolic Panel:  Recent Labs  08/21/15 2322 08/21/15 2331 08/22/15 0244 08/22/15 1214  NA 136 139 141  --   K 4.3 4.4 4.4  --   CL 103 103 104  --   CO2 23  --  26  --     GLUCOSE 130* 116* 102*  --   BUN 34* 39* 32*  --   CREATININE 1.51* 1.40* 1.39*  --   CALCIUM 9.3  --  9.7  --   MG  --   --   --  2.1   Liver Function Tests: No results for input(s): AST, ALT, ALKPHOS, BILITOT, PROT, ALBUMIN in the last 72 hours. No results for input(s): LIPASE, AMYLASE in the last 72 hours. CBC:  Recent Labs  08/22/15 0244 08/23/15 0558  WBC 5.7 6.4  HGB 10.7* 10.4*  HCT 34.6* 33.3*  MCV 85.2 86.5  PLT 256 224   Cardiac Enzymes: No results for input(s): CKTOTAL, CKMB, CKMBINDEX, TROPONINI in the last 72 hours. BNP: Invalid input(s): POCBNP D-Dimer: No results for input(s): DDIMER in the last 72 hours. Hemoglobin A1C: No results for input(s): HGBA1C in the last 72 hours. Fasting Lipid Panel: No results for input(s): CHOL, HDL, LDLCALC, TRIG, CHOLHDL, LDLDIRECT in the last 72 hours. Thyroid Function Tests:  Recent Labs  08/22/15 1214  TSH 1.803   Anemia Panel: No results for input(s): VITAMINB12, FOLATE, FERRITIN, TIBC, IRON, RETICCTPCT in the last 72 hours.  RADIOLOGY: Dg Chest Portable 1  View  08/21/2015  CLINICAL DATA:  Initial valuation for 3 day history of acute shortness of breath. Lower extremity swelling. EXAM: PORTABLE CHEST 1 VIEW COMPARISON:  Prior study from 05/09/2015. FINDINGS: To particular but later pat it is overlie the left chest. Moderate cardiomegaly is stable. Mediastinal silhouette within normal limits. Diffuse pulmonary vascular congestion with indistinctness of the nursing show markings, consistent with moderate diffuse pulmonary edema. Small bilateral pleural effusions present. Findings consistent with acute CHF exacerbation. No definite focal infiltrates. No pneumothorax. Diffuse osteopenia noted.  No acute osseous abnormality. IMPRESSION: Cardiomegaly with moderate diffuse pulmonary edema and bilateral pleural effusions, consistent with acute CHF exacerbation. Electronically Signed   By: Jeannine Boga M.D.   On:  08/21/2015 23:48    ASSESSMENT AND PLAN:  Active Problems:   Bradycardia   Acute systolic congestive heart failure (HCC)   Junctional (nodal) bradycardia 1. Tachy/brady syndrome Dual chamber pacemaker placed yesterday without issues.  Having some neck pain today which may be the result of laying in bed or issues with implant.  Device check without issues and no issues on chest x-ray.  Ustin Cruickshank discharge home with   2. Chronic diastolic heart failure IV Lasix ordered Roney Youtz likely improve post pacing  3. UTI Macrobid 100 mg BID for one week  4. Atrial fibrillation/flutter Sinus rhythm today.  Emrick Hensch plan for INR check today with recheck in clinic early in the week.  Carroll Ranney Meredith Leeds, MD 08/23/2015 8:24 AM

## 2015-08-23 NOTE — Discharge Summary (Signed)
    See discharge summary from 08/25/15.  Allegra Lai, MD

## 2015-08-24 DIAGNOSIS — I5023 Acute on chronic systolic (congestive) heart failure: Secondary | ICD-10-CM

## 2015-08-24 DIAGNOSIS — N3000 Acute cystitis without hematuria: Secondary | ICD-10-CM

## 2015-08-24 LAB — BASIC METABOLIC PANEL
Anion gap: 11 (ref 5–15)
BUN: 30 mg/dL — AB (ref 6–20)
CALCIUM: 9.4 mg/dL (ref 8.9–10.3)
CO2: 29 mmol/L (ref 22–32)
CREATININE: 1.71 mg/dL — AB (ref 0.44–1.00)
Chloride: 97 mmol/L — ABNORMAL LOW (ref 101–111)
GFR calc Af Amer: 30 mL/min — ABNORMAL LOW (ref >60)
GFR, EST NON AFRICAN AMERICAN: 26 mL/min — AB (ref >60)
Glucose, Bld: 96 mg/dL (ref 65–99)
Potassium: 4.6 mmol/L (ref 3.5–5.1)
SODIUM: 137 mmol/L (ref 135–145)

## 2015-08-24 MED ORDER — FUROSEMIDE 20 MG PO TABS: 20.0000 mg | ORAL_TABLET | Freq: Two times a day (BID) | ORAL | Status: DC

## 2015-08-24 MED ORDER — WARFARIN - PHARMACIST DOSING INPATIENT: Freq: Every day | Status: DC

## 2015-08-24 MED ORDER — WARFARIN SODIUM 2.5 MG PO TABS
2.5000 mg | ORAL_TABLET | ORAL | Status: AC
  Administered 2015-08-24: 2.5 mg via ORAL
  Filled 2015-08-24: qty 1

## 2015-08-24 MED ORDER — CEFUROXIME AXETIL 250 MG PO TABS
250.0000 mg | ORAL_TABLET | Freq: Two times a day (BID) | ORAL | Status: DC
Start: 2015-08-24 — End: 2015-08-25
  Administered 2015-08-24 – 2015-08-25 (×3): 250 mg via ORAL
  Filled 2015-08-24 (×4): qty 1

## 2015-08-24 MED ORDER — CEFUROXIME AXETIL 250 MG PO TABS: 250.0000 mg | ORAL_TABLET | Freq: Two times a day (BID) | ORAL | Status: DC

## 2015-08-24 MED ORDER — AMIODARONE HCL 200 MG PO TABS: 200.0000 mg | ORAL_TABLET | Freq: Every day | ORAL | Status: DC

## 2015-08-24 NOTE — Progress Notes (Signed)
ANTICOAGULATION CONSULT NOTE - Initial Consult  Pharmacy Consult for coumadin Indication: atrial fibrillation  Allergies  Allergen Reactions  . Lactose Intolerance (Gi) Other (See Comments)    Stomach pain  . Milk-Related Compounds Other (See Comments)    Upset stomach   . Sulfa Antibiotics Itching    Patient Measurements: Height: 5\' 6"  (167.6 cm) Weight: 126 lb 8 oz (57.38 kg) IBW/kg (Calculated) : 59.3 Heparin Dosing Weight:   Vital Signs: Temp: 98.2 F (36.8 C) (05/21 0500) Temp Source: Oral (05/21 0500) BP: 91/49 mmHg (05/21 0500) Pulse Rate: 74 (05/21 0500)  Labs:  Recent Labs  08/21/15 2322 08/21/15 2331 08/22/15 0244 08/22/15 1214 08/23/15 0558 08/23/15 1000 08/24/15 0414  HGB 10.3* 12.9 10.7*  --  10.4*  --   --   HCT 32.9* 38.0 34.6*  --  33.3*  --   --   PLT 260  --  256  --  224  --   --   LABPROT 45.2*  --  41.5* 42.1*  --  17.5*  --   INR 5.00*  --  4.49* 4.59*  --  1.43  --   CREATININE 1.51* 1.40* 1.39*  --   --   --  1.71*    Estimated Creatinine Clearance: 21 mL/min (by C-G formula based on Cr of 1.71).   Medical History: Past Medical History  Diagnosis Date  . Hypertension   . Asthma   . Glaucoma   . Incontinence   . Wears dentures   . Wears glasses   . Thyroid disease     "something years ago"  . GERD (gastroesophageal reflux disease)   . Cancer (HCC)     rt breast  . Edema leg   . Fast breathing     "because of pill"  . Pneumonia     h/o younger  . Arthritis     "all over"  . S/P total knee arthroplasty   . Glaucoma   . Swelling     bilateral feet/ legs, more left.  . Atrial fibrillation ([place])   . Diabetes mellitus without complication ([place])   . UTI (urinary tract infection) 02/12/2015  . CHF (congestive heart failure) (HCC)     Medications:  Scheduled:  . sodium chloride   Intravenous Once  . anastrozole  1 mg Oral Daily  . cefUROXime  250 mg Oral BID WC  . latanoprost  1 drop Both Eyes QHS  . sodium chloride  flush  3 mL Intravenous Q12H  . traZODone  50 mg Oral QHS   Infusions:    Assessment: 82 yo who was admitted for a pacer exchange. She was on coumadin for afib. Admission INR was 5 so she got 1 dose of vit k for reversal. Repeat INR was 1.43 yesterday. Plan was to dc home yesterday but got canceled. Will restart her coumadin today with the plan to check INR tues/wed next week. She is on amiodarone.   Goal of Therapy:  INR 2-3 Monitor platelets by anticoagulation protocol: Yes   Plan:   Coumadin 2.5mg  PO before DC (discharge on 2.5mg  daily for next few days then INR)  Onnie Boer, PharmD Pager: [phone] 08/24/2015 9:09 AM

## 2015-08-24 NOTE — Progress Notes (Signed)
Physical Therapy Treatment Patient Details Name: Natasha Fletcher MRN: [id number] DOB: 02-24-1926 Today's Date: 08/24/2015    History of Present Illness 82 y.o. female admitted to [facility] on 08/21/15 for symptomatic bradycardia s/p pacemaker on 08/22/15.  Pt with significant PMHx of HTN, R breast CA, bil TKA, A-fib, DM, CHF, bil THA, L shoulder surgery, and multiple cardioversions.      PT Comments    Natasha Fletcher presents w/ overall increased generalized weakness and functional decline from yesterday.  Pt repeating "I'm weak, I'm hurting" and c/o dizziness w/ sit<>stand transfers.  Pt required max encouragement and increased assistance (mod assist) today.  Due to pt's functional decline there is increasing concern over pt's safety at home.  Therefore, recommendations have been updated to SNF.    Follow Up Recommendations  Supervision for mobility/OOB;SNF     Equipment Recommendations  None recommended by PT    Recommendations for Other Services OT consult     Precautions / Restrictions Precautions Precautions: Fall;ICD/Pacemaker Precaution Comments: Pt weak and painful on her feet Restrictions Weight Bearing Restrictions: No    Mobility  Bed Mobility Overal bed mobility: Needs Assistance Bed Mobility: Supine to Sit     Supine to sit: Min assist;HOB elevated     General bed mobility comments: Min assist to support to scoot EOB.  Increased time and effort.  Transfers Overall transfer level: Needs assistance Equipment used: Rolling walker (2 wheeled) Transfers: Sit to/from Stand Sit to Stand: Min assist;Mod assist         General transfer comment: Min assist to stand from bed to boost to standing and to steady due to posterior lean.  Pt becomes anxious about back pain.  Mod assist to stand from chair w/ strong posterior lean w/ flexed posture.    Ambulation/Gait Ambulation/Gait assistance: Min assist Ambulation Distance (Feet): 5 Feet Assistive device: Rolling walker (2  wheeled) Gait Pattern/deviations: Trunk flexed;Decreased stride length   Gait velocity interpretation: <1.8 ft/sec, indicative of risk for recurrent falls General Gait Details: Flexed posture and assist to steady.  Pt c/o increasing dizziness and vision changes and is assisted back to sitting.  Neuro check completed and no abnormalities noted.  Vision clears almost immedicately after sitting.  RN notified.  HR 80s-90s.  BP below in general comments.   Stairs            Wheelchair Mobility    Modified Rankin (Stroke Patients Only)       Balance Overall balance assessment: Needs assistance Sitting-balance support: Bilateral upper extremity supported;Feet supported Sitting balance-Leahy Scale: Fair     Standing balance support: Bilateral upper extremity supported;During functional activity Standing balance-Leahy Scale: Poor Standing balance comment: Bil UE support and physical assist to steady                     Cognition Arousal/Alertness: Awake/alert Behavior During Therapy: Anxious Overall Cognitive Status: Within Functional Limits for tasks assessed                      Exercises General Exercises - Upper Extremity Elbow Flexion: AROM;Left;10 reps (encouraged to decrease stiffness) General Exercises - Lower Extremity Ankle Circles/Pumps: AROM;Both;10 reps;Seated Straight Leg Raises: AROM;Both;5 reps;Seated    General Comments General comments (skin integrity, edema, etc.): BP supine 98/54, sitting EOB 107/57, sitting in chair at end of session 105/56.        Pertinent Vitals/Pain Pain Assessment: Faces Faces Pain Scale: Hurts even more Pain Location: Lt arm/incisional,  Bil LEs Pain Descriptors / Indicators: Aching;Sharp;Tingling Pain Intervention(s): Limited activity within patient's tolerance;Monitored during session;Repositioned    Home Living Family/patient expects to be discharged to:: Private residence Living Arrangements: Other  relatives   Type of Home: House Home Access: Stairs to enter Entrance Stairs-Rails:  (the 4 steps have rails, the one doesn't) Home Layout: Multi-level (tri level (split level)) Home Equipment: Walker - 2 wheels;Cane - single point;Bedside commode;Shower seat;Grab bars - toilet      Prior Function Level of Independence: Needs assistance  Gait / Transfers Assistance Needed: Pt uses a cane for community access ADL's / Homemaking Assistance Needed: I with ADLs, but family perform homemaking tasks.       PT Goals (current goals can now be found in the care plan section) Acute Rehab PT Goals Patient Stated Goal: to go home PT Goal Formulation: With patient Time For Goal Achievement: 09/06/15 Potential to Achieve Goals: Fair Progress towards PT goals: Not progressing toward goals - comment (due to pain, generalized weakness)    Frequency  Min 3X/week    PT Plan Discharge plan needs to be updated    Co-evaluation             End of Session Equipment Utilized During Treatment: Gait belt Activity Tolerance: Patient limited by pain;Patient limited by fatigue Patient left: in chair;with call bell/phone within reach;with chair alarm set     Time: [phone] PT Time Calculation (min) (ACUTE ONLY): 32 min  Charges:  $Therapeutic Activity: 23-37 mins                    G Codes:      Natasha Fletcher PT, DPT  Pager: [phone] Phone: [phone] 08/24/2015, 9:42 AM

## 2015-08-24 NOTE — Evaluation (Signed)
Occupational Therapy Evaluation Patient Details Name: Natasha Fletcher MRN: [id number] DOB: 1925/06/21 Today's Date: 08/24/2015    History of Present Illness 82 y.o. female admitted to [facility] on 08/21/15 for symptomatic bradycardia s/p pacemaker on 08/22/15.  Pt with significant PMHx of HTN, R breast CA, bil TKA, A-fib, DM, CHF, bil THA, L shoulder surgery, and multiple cardioversions.     Clinical Impression   Pt. Was cooperative with therapy and is motivated to d/c home with family support. Pt. Has decreased I with ADLs and mobility secondary to  Weakness, pain, and dizziness. Pt. Would benefit from further OT to maximize I with ADLs and mobility prior to d/c home with family to decrease burden of care.     Follow Up Recommendations  Home health OT    Equipment Recommendations       Recommendations for Other Services       Precautions / Restrictions Precautions Precautions: Fall;ICD/Pacemaker Precaution Comments: Pt weak and painful on her feet Restrictions Weight Bearing Restrictions: No      Mobility Bed Mobility Overal bed mobility: Needs Assistance Bed Mobility: Supine to Sit     Supine to sit: Min assist     General bed mobility comments: Min assist to support trunk to get to EOB.   Transfers Overall transfer level: Needs assistance   Transfers: Sit to/from Stand Sit to Stand: Min assist         General transfer comment: Min assist to support trunk during transitions.  Min assist to lower to chair with controlled descent.  Verbal cues for safe hand placement.     Balance                                            ADL Overall ADL's : Needs assistance/impaired Eating/Feeding: Independent   Grooming: Wash/dry hands;Wash/dry face;Supervision/safety;Sitting   Upper Body Bathing: Minimal assitance;Sitting   Lower Body Bathing: Moderate assistance;Sit to/from stand   Upper Body Dressing : Moderate assistance;Sitting   Lower Body  Dressing: Maximal assistance;Sit to/from stand   Toilet Transfer: Moderate assistance;BSC   Toileting- Clothing Manipulation and Hygiene: Moderate assistance;Sit to/from stand       Functional mobility during ADLs: Moderate assistance General ADL Comments: Pt. is having pain and dizziness that is limiting I with ADLs.      Vision     Perception     Praxis      Pertinent Vitals/Pain Pain Assessment: 0-10 Faces Pain Scale: Hurts even more Pain Descriptors / Indicators: Radiating Pain Intervention(s): Limited activity within patient's tolerance;Monitored during session;Patient requesting pain meds-RN notified     Hand Dominance Right   Extremity/Trunk Assessment Upper Extremity Assessment Upper Extremity Assessment: LUE deficits/detail LUE Deficits / Details: reviewed left arm pacemaker precautions, sling removed for mobility and gait           Communication Communication Communication: HOH   Cognition Arousal/Alertness: Awake/alert Behavior During Therapy: WFL for tasks assessed/performed Overall Cognitive Status: Within Functional Limits for tasks assessed                     General Comments       Exercises       Shoulder Instructions      Home Living Family/patient expects to be discharged to:: Private residence Living Arrangements: Other relatives   Type of Home: House Home Access: Stairs to enter [employer]  of Steps: 1 (at the back 4 at the front with rails. ) Entrance Stairs-Rails:  (the 4 steps have rails, the one doesn't) Home Layout: Multi-level (tri level (split level)) Alternate Level Stairs-Number of Steps: 6 Alternate Level Stairs-Rails: Right Bathroom Shower/Tub: Tub/shower unit Shower/tub characteristics: Architectural technologist: Standard     Home Equipment: Environmental consultant - 2 wheels;Cane - single point;Bedside commode;Shower seat;Grab bars - toilet          Prior Functioning/Environment Level of Independence: Needs  assistance  Gait / Transfers Assistance Needed: Pt uses a cane for community access ADL's / Homemaking Assistance Needed: I with ADLs, but family perform homemaking tasks.   Communication / Swallowing Assistance Needed: no difficulties      OT Diagnosis: Generalized weakness;Acute pain   OT Problem List: Decreased strength;Decreased range of motion;Decreased activity tolerance;Decreased knowledge of use of DME or AE;Decreased knowledge of precautions;Pain   OT Treatment/Interventions: Self-care/ADL training;Therapeutic exercise;DME and/or AE instruction;Therapeutic activities;Patient/family education    OT Goals(Current goals can be found in the care plan section) Acute Rehab OT Goals Patient Stated Goal: to go home with family assit. OT Goal Formulation: With patient Time For Goal Achievement: 09/07/15 Potential to Achieve Goals: Good ADL Goals Pt Will Perform Grooming: Independently;standing Pt Will Perform Upper Body Bathing: Independently;sitting Pt Will Perform Lower Body Bathing: with modified independence;sit to/from stand Pt Will Perform Upper Body Dressing: with modified independence;sitting Pt Will Perform Lower Body Dressing: with modified independence;sit to/from stand Pt Will Transfer to Toilet: with modified independence;regular height toilet;ambulating;grab bars Pt Will Perform Toileting - Clothing Manipulation and hygiene: with modified independence;sit to/from stand Pt/caregiver will Perform Home Exercise Program: Left upper extremity;Independently  OT Frequency: Min 2X/week   Barriers to D/C:            Co-evaluation              End of Session Nurse Communication: Patient requests pain meds  Activity Tolerance: Patient limited by pain Patient left: in bed;with call bell/phone within reach;with bed alarm set   Time: 0710-0804 OT Time Calculation (min): 54 min Charges:  OT General Charges $OT Visit: 1 Procedure OT Evaluation $OT Eval Moderate  Complexity: 1 Procedure OT Treatments $Self Care/Home Management : 23-37 mins G-Codes:    Domnick Chervenak 05-Sep-2015, 8:09 AM

## 2015-08-24 NOTE — Progress Notes (Deleted)
SUBJECTIVE: The patient is doing well today.  At this time, she denies chest pain, shortness of breath.  Continues to have pain over incision site that is somewhat improved with oxycodone.  Worked with PT yesterday and continues to complain of weakness.    . sodium chloride   Intravenous Once  . anastrozole  1 mg Oral Daily  . latanoprost  1 drop Both Eyes QHS  . piperacillin-tazobactam (ZOSYN)  IV  3.375 g Intravenous Q8H  . sodium chloride flush  3 mL Intravenous Q12H  . traZODone  50 mg Oral QHS      OBJECTIVE: Physical Exam: Filed Vitals:   08/23/15 0013 08/23/15 0509 08/23/15 1325 08/24/15 0500  BP: 130/67 129/74 100/50 91/49  Pulse: 60 60 64 74  Temp:  97.9 F (36.6 C) 97.8 F (36.6 C) 98.2 F (36.8 C)  TempSrc:  Oral Oral Oral  Resp: 16 12 18 16   Height:      Weight:  128 lb 1.6 oz (58.106 kg)  126 lb 8 oz (57.38 kg)  SpO2: 95% 97% 96% 100%    Intake/Output Summary (Last 24 hours) at 08/24/15 [id number] Last data filed at 08/24/15 0700  Gross per 24 hour  Intake 1290.33 ml  Output   2525 ml  Net -1234.67 ml    Telemetry reveals sinus rhythm  GEN- The patient is well appearing, alert and oriented x 3 today.   Head- normocephalic, atraumatic Eyes-  Sclera clear, conjunctiva pink Ears- hearing intact Oropharynx- clear Neck- supple, no JVP Lymph- no cervical lymphadenopathy Lungs- Clear to ausculation bilaterally, normal work of breathing Heart- Regular rate and rhythm, no murmurs, rubs or gallops, PMI not laterally displaced GI- soft, NT, ND, + BS Extremities- no clubbing, cyanosis, or edema Skin- no rash or lesion Psych- euthymic mood, full affect Neuro- strength and sensation are intact  LABS: Basic Metabolic Panel:  Recent Labs  08/22/15 0244 08/22/15 1214 08/24/15 0414  NA 141  --  137  K 4.4  --  4.6  CL 104  --  97*  CO2 26  --  29  GLUCOSE 102*  --  96  BUN 32*  --  30*  CREATININE 1.39*  --  1.71*  CALCIUM 9.7  --  9.4  MG  --  2.1  --      Liver Function Tests: No results for input(s): AST, ALT, ALKPHOS, BILITOT, PROT, ALBUMIN in the last 72 hours. No results for input(s): LIPASE, AMYLASE in the last 72 hours. CBC:  Recent Labs  08/22/15 0244 08/23/15 0558  WBC 5.7 6.4  HGB 10.7* 10.4*  HCT 34.6* 33.3*  MCV 85.2 86.5  PLT 256 224   Cardiac Enzymes: No results for input(s): CKTOTAL, CKMB, CKMBINDEX, TROPONINI in the last 72 hours. BNP: Invalid input(s): POCBNP D-Dimer: No results for input(s): DDIMER in the last 72 hours. Hemoglobin A1C: No results for input(s): HGBA1C in the last 72 hours. Fasting Lipid Panel: No results for input(s): CHOL, HDL, LDLCALC, TRIG, CHOLHDL, LDLDIRECT in the last 72 hours. Thyroid Function Tests:  Recent Labs  08/22/15 1214  TSH 1.803   Anemia Panel: No results for input(s): VITAMINB12, FOLATE, FERRITIN, TIBC, IRON, RETICCTPCT in the last 72 hours.  RADIOLOGY: Dg Chest 2 View  08/23/2015  CLINICAL DATA:  Cardiac device in situ. Hx of HTN, asthma, PNA, AFIB, DM, & CHF. EXAM: CHEST  2 VIEW COMPARISON:  08/21/2015 FINDINGS: New left anterior chest wall sequential pacemaker is well positioned with its leads  projecting in the right atrium and right ventricle. No pneumothorax. Cardiac silhouette is mildly enlarged. No mediastinal or hilar masses. No convincing adenopathy. Lungs show mild interstitial thickening. There are small pleural effusions. IMPRESSION: 1. New pacemaker is well positioned.  No pneumothorax. 2. Improved lung aeration when compared to the prior study. There is still mild interstitial thickening with small pleural effusions consistent with mild residual congestive heart failure. Electronically Signed   By: Lajean Manes M.D.   On: 08/23/2015 08:48   Dg Chest Portable 1 View  08/21/2015  CLINICAL DATA:  Initial valuation for 3 day history of acute shortness of breath. Lower extremity swelling. EXAM: PORTABLE CHEST 1 VIEW COMPARISON:  Prior study from 05/09/2015.  FINDINGS: To particular but later pat it is overlie the left chest. Moderate cardiomegaly is stable. Mediastinal silhouette within normal limits. Diffuse pulmonary vascular congestion with indistinctness of the nursing show markings, consistent with moderate diffuse pulmonary edema. Small bilateral pleural effusions present. Findings consistent with acute CHF exacerbation. No definite focal infiltrates. No pneumothorax. Diffuse osteopenia noted.  No acute osseous abnormality. IMPRESSION: Cardiomegaly with moderate diffuse pulmonary edema and bilateral pleural effusions, consistent with acute CHF exacerbation. Electronically Signed   By: Jeannine Boga M.D.   On: 08/21/2015 23:48    ASSESSMENT AND PLAN:  Active Problems:   Bradycardia   Acute systolic congestive heart failure (HCC)   Junctional (nodal) bradycardia 1. Tachy/brady syndrome Dual chamber pacemaker placed yesterday without issues.  Having some neck pain today which may be the result of laying in bed or issues with implant.  Device check without issues and no issues on chest x-ray.    2. Chronic diastolic heart failure IV Lasix ordered Natasha Fletcher likely improve post pacing  3. UTI Macrobid 100 mg BID for one week  4. Atrial fibrillation/flutter Sinus rhythm today.  Natasha Fletcher plan for INR check today with recheck in clinic early in the week.  5. Deconditioning: has worked with PT/OT who feel that she is potentially OK for home health PT and OT.  Natasha Fletcher plan on her working with them again and if they agree, could go home with home health vs SNF admission for more advanced rehab.    Natasha Fatzinger Meredith Leeds, MD 08/24/2015 8:22 AM

## 2015-08-24 NOTE — Progress Notes (Signed)
Patient: Natasha Fletcher / Admit Date: 08/21/2015 / Date of Encounter: 08/24/2015, 7:18 AM   Subjective: Seen by PT on 5/20 given her generalized weakness. Plan for stairs today with PT, ok she does ok with them ok to discharge from PT standpoint with HHPT and assistance from her brother who Djuana Littleton be in town through the end of June. She has diuresed 7.2 L for the admission. Renal function with a slight bump this morning from 1.39-->1.7. IV Lasix and KCl has been held. She reports her breathing to be much better this morning, though she does state she is not doing well this morning. She reports continued neck pain as well as soreness of digits 1-4 of the left hand. Full ROM and normal sensation.   Review of Systems: Review of Systems  Constitutional: Positive for weight loss and malaise/fatigue. Negative for fever, chills and diaphoresis.  HENT: Negative for congestion.   Eyes: Negative for discharge and redness.  Respiratory: Negative for cough, hemoptysis, sputum production, shortness of breath and wheezing.   Cardiovascular: Negative for chest pain, palpitations, orthopnea, claudication, leg swelling and PND.  Gastrointestinal: Negative for heartburn, nausea, vomiting and abdominal pain.  Musculoskeletal: Positive for myalgias, joint pain and neck pain. Negative for falls.       Digits 1-4 of left hand ache  Skin: Negative for rash.  Neurological: Positive for weakness. Negative for dizziness, tingling, tremors, sensory change, speech change, focal weakness and loss of consciousness.  Endo/Heme/Allergies: Does not bruise/bleed easily.  Psychiatric/Behavioral: The patient is nervous/anxious.   All other systems reviewed and are negative.   Objective: Telemetry: Paced Physical Exam: Blood pressure 91/49, pulse 74, temperature 98.2 F (36.8 C), temperature source Oral, resp. rate 16, height 5\' 6"  (1.676 m), weight 126 lb 8 oz (57.38 kg), SpO2 100 %. Body mass index is 20.43  kg/(m^2). General: Well developed, well nourished, in no acute distress. Head: Normocephalic, atraumatic, sclera non-icteric, no xanthomas, nares are without discharge. Neck: Negative for carotid bruits. JVP not elevated. Lungs: Clear bilaterally to auscultation without wheezes, rales, or rhonchi. Breathing is unlabored. Heart: RRR S1 S2 without murmurs, rubs, or gallops. Incision site is well healed without erythema, bleeding, bruising, swelling, or purulent discharge. Not wearing sling.  Abdomen: Soft, non-tender, non-distended with normoactive bowel sounds. No rebound/guarding. Extremities: No clubbing or cyanosis. No edema. Distal pedal pulses are 2+ and equal bilaterally. Neuro: Alert and oriented X 3. Moves all extremities spontaneously. Psych:  Responds to questions appropriately with a normal affect.   Intake/Output Summary (Last 24 hours) at 08/24/15 0718 Last data filed at 08/24/15 0539  Gross per 24 hour  Intake   1122 ml  Output   2525 ml  Net  -1403 ml    Inpatient Medications:  . sodium chloride   Intravenous Once  . anastrozole  1 mg Oral Daily  . latanoprost  1 drop Both Eyes QHS  . piperacillin-tazobactam (ZOSYN)  IV  3.375 g Intravenous Q8H  . sodium chloride flush  3 mL Intravenous Q12H  . traZODone  50 mg Oral QHS   Infusions:    Labs:  Recent Labs  08/22/15 0244 08/22/15 1214 08/24/15 0414  NA 141  --  137  K 4.4  --  4.6  CL 104  --  97*  CO2 26  --  29  GLUCOSE 102*  --  96  BUN 32*  --  30*  CREATININE 1.39*  --  1.71*  CALCIUM 9.7  --  9.4  MG  --  2.1  --    No results for input(s): AST, ALT, ALKPHOS, BILITOT, PROT, ALBUMIN in the last 72 hours.  Recent Labs  08/22/15 0244 08/23/15 0558  WBC 5.7 6.4  HGB 10.7* 10.4*  HCT 34.6* 33.3*  MCV 85.2 86.5  PLT 256 224   No results for input(s): CKTOTAL, CKMB, TROPONINI in the last 72 hours. Invalid input(s): POCBNP No results for input(s): HGBA1C in the last 72 hours.   Weights: Filed  Weights   08/22/15 0225 08/23/15 0509 08/24/15 0500  Weight: 138 lb 6.4 oz (62.778 kg) 128 lb 1.6 oz (58.106 kg) 126 lb 8 oz (57.38 kg)     Radiology/Studies:  Dg Chest 2 View  08/23/2015  CLINICAL DATA:  Cardiac device in situ. Hx of HTN, asthma, PNA, AFIB, DM, & CHF. EXAM: CHEST  2 VIEW COMPARISON:  08/21/2015 FINDINGS: New left anterior chest wall sequential pacemaker is well positioned with its leads projecting in the right atrium and right ventricle. No pneumothorax. Cardiac silhouette is mildly enlarged. No mediastinal or hilar masses. No convincing adenopathy. Lungs show mild interstitial thickening. There are small pleural effusions. IMPRESSION: 1. New pacemaker is well positioned.  No pneumothorax. 2. Improved lung aeration when compared to the prior study. There is still mild interstitial thickening with small pleural effusions consistent with mild residual congestive heart failure. Electronically Signed   By: Lajean Manes M.D.   On: 08/23/2015 08:48   Dg Chest Portable 1 View  08/21/2015  CLINICAL DATA:  Initial valuation for 3 day history of acute shortness of breath. Lower extremity swelling. EXAM: PORTABLE CHEST 1 VIEW COMPARISON:  Prior study from 05/09/2015. FINDINGS: To particular but later pat it is overlie the left chest. Moderate cardiomegaly is stable. Mediastinal silhouette within normal limits. Diffuse pulmonary vascular congestion with indistinctness of the nursing show markings, consistent with moderate diffuse pulmonary edema. Small bilateral pleural effusions present. Findings consistent with acute CHF exacerbation. No definite focal infiltrates. No pneumothorax. Diffuse osteopenia noted.  No acute osseous abnormality. IMPRESSION: Cardiomegaly with moderate diffuse pulmonary edema and bilateral pleural effusions, consistent with acute CHF exacerbation. Electronically Signed   By: Jeannine Boga M.D.   On: 08/21/2015 23:48     Assessment and Plan    Active  Problems:   Bradycardia   Acute systolic congestive heart failure (HCC)   Junctional (nodal) bradycardia  1. Tachy-brady syndrome: -Status post dual chamber PPM placement on 08/22/15 -Post procedure CXR non-acute -Discharge home with amiodarone (decreased down to 200 mg daily at this time) and metoprolol  2. Acute on chronic diastolic CHF: -She has diuresed 7.2 L this admission -IV Lasix held this morning given slight bump in SCr from 1.39-->1.7 -This should improve s/p pacing -May require lower dose Lasix daily or prn with KCl repletion   3. UTI: -Case discussed with pharmacy who recommend Ceftin 250 mg bid x 5 days as outpatient over [place] given patient's age and renal function -Follow up with PCP for repeat UA to assess for clearing   4. Afib/flutter: -Paced -Metoprolol and amiodarone as above -INR 1.43 today, Coumadin per pharmacy -Khadija Thier need repeat INR check mid week  5. Neck pain: -No changes -May benefit from ambulating and ultimately getting back to her bed/pillow -No direct trauma to the neck  6. Dispo: -Seen by PT who recommends HHPT as well as assistance from her brother who Ralf Konopka be in town through the end of June -If she does ok with stairs  today, ok to discharge from PT standpoint per note   Signed, Christell Faith, PA-C Pager: [phone] 08/24/2015, 7:18 AM  I have seen and examined this patient with Christell Faith.  Agree with above, note added to reflect my findings.  On exam, regular rhythm, no murmurs, lungs clear.  Had dual chamber pacemaker placed for tachy-brady syndrome.  CUrrently with pain over pacemaker site getting oxycodone.  Plan for her to see PT/OT today as she is quite deconditioned.  May require home health PT/OT vs SNF placement if she is having trouble with ambulation.  Plan for possible discharge today pending PT/OT evaluation.    Estrellita Lasky M. Antero Derosia MD 08/24/2015 8:29 AM

## 2015-08-25 ENCOUNTER — Encounter (HOSPITAL_COMMUNITY): Payer: Self-pay | Admitting: Cardiology

## 2015-08-25 LAB — BASIC METABOLIC PANEL
ANION GAP: 8 (ref 5–15)
BUN: 31 mg/dL — ABNORMAL HIGH (ref 6–20)
CALCIUM: 9.9 mg/dL (ref 8.9–10.3)
CHLORIDE: 97 mmol/L — AB (ref 101–111)
CO2: 32 mmol/L (ref 22–32)
Creatinine, Ser: 1.6 mg/dL — ABNORMAL HIGH (ref 0.44–1.00)
GFR calc Af Amer: 32 mL/min — ABNORMAL LOW (ref >60)
GFR calc non Af Amer: 28 mL/min — ABNORMAL LOW (ref >60)
GLUCOSE: 101 mg/dL — AB (ref 65–99)
POTASSIUM: 4.3 mmol/L (ref 3.5–5.1)
Sodium: 137 mmol/L (ref 135–145)

## 2015-08-25 LAB — PROTIME-INR
INR: 1.56 — ABNORMAL HIGH (ref 0.00–1.49)
PROTHROMBIN TIME: 18.8 s — AB (ref 11.6–15.2)

## 2015-08-25 MED ORDER — WARFARIN SODIUM 2.5 MG PO TABS: 2.5000 mg | ORAL_TABLET | Freq: Once | ORAL | Status: DC

## 2015-08-25 MED ORDER — CEFUROXIME AXETIL 250 MG PO TABS: 250.0000 mg | ORAL_TABLET | Freq: Two times a day (BID) | ORAL | Status: AC

## 2015-08-25 MED ORDER — OXYCODONE HCL 5 MG PO TABS: 5.0000 mg | ORAL_TABLET | ORAL | Status: DC | PRN

## 2015-08-25 MED ORDER — WARFARIN SODIUM 2.5 MG PO TABS: 2.5000 mg | ORAL_TABLET | Freq: Every day | ORAL | Status: DC

## 2015-08-25 MED FILL — Gentamicin Sulfate Inj 40 MG/ML: INTRAMUSCULAR | Qty: 2 | Status: AC

## 2015-08-25 MED FILL — Heparin Sodium (Porcine) 2 Unit/ML in Sodium Chloride 0.9%: INTRAMUSCULAR | Qty: 500 | Status: AC

## 2015-08-25 MED FILL — Sodium Chloride Irrigation Soln 0.9%: Qty: 500 | Status: AC

## 2015-08-25 NOTE — Discharge Summary (Signed)
ELECTROPHYSIOLOGY PROCEDURE DISCHARGE SUMMARY    Patient ID: Natasha Fletcher,  MRN: [id number], DOB/AGE: 80-Feb-1927 80 y.o.  Admit date: 08/21/2015 Discharge date: 08/25/2015  Primary Care Physician: Myriam Jacobson, MD Primary Cardiologist: Dr. Claiborne Billings Electrophysiologist: Dr. Curt Bears (new)  Primary Discharge Diagnosis:  Symptomatic bradycardia status post pacemaker implantation this admission  Secondary Discharge Diagnosis:  Acute CHF (diastolic) Functional decline  Allergies  Allergen Reactions  . Lactose Intolerance (Gi) Other (See Comments)    Stomach pain  . Milk-Related Compounds Other (See Comments)    Upset stomach   . Sulfa Antibiotics Itching     Procedures This Admission:  1.  Implantation of a MDT dual chamber PPM on 08/22/15 by Dr Curt Bears.  The patient received a Medtronic Advisa DR MRI [id number] (serial number [id number])Medtronic model 5076 (serial number [id number]) right atrial lead and a Medtronic model 5076 (serial number [id number]) right ventricular lead  There were no immediate post procedure complications. 2.  CXR on 08/23/15 demonstrated no pneumothorax status post device implantation.   Brief HPI: 82 year old female with history of atrial fibrillation and flutter, chronic diastolic CHF, HTN who presented to [facility] on [facility]22[facility]15 with severe dyspnea for several days and was found to be in junctional bradycardia with volume overload. Atrial fibrillation was diagnosed 2015, had DCCV 10/15. She was in atrial flutter in 10/16, cardioverted. In atrial fibrillation in 3/17, cardioverted. Back in atrial flutter in 4/17, TEE-guided DCCV after starting amiodarone. Recently called in to office with lightheadedness and was told to decrease her metoprolol. She reported that she had been more short of breath for 2-3 weeks. She was dyspneic walking short distances or going up stairs. For 2-3 days, she has been very dizzy with standing but has not fallen. She was  having a hard time getting around her house. No chest pain. No syncope.    Hospital Course:  Consultants: pharmacy  In the ER, HR was in the 30s with a junctional bradycardia. SBP in 140s-150s. On telemetry, she had occasional runs of NSR in the 50s, then back to junctional bradycardia. She was noted to be on Cardizem CD, metoprolol, and amiodarone. Her AV nodal blocking agents were held CXR showed pulmonary edema. She was diuresed with IV Lasix and had a total UOP of 5.6 L for the admission. She also has lower abdominal pain and a dirty UA c/w UTI and was started on Zosyn per pharmacy. Upon admission her INR was 5, Coumadin was held and patient received vitamin K and FFP x 2 doses. She was seen by EP and met requirements for PPM given her symptomatic tachy-brady syndrome. She underwent successful implantation of of MDT Advisa DR MRI dual-chamber PPM on 08/22/2015. Post procedure CXR on 5/20 showed no pneumothorax and device check was with normal function. There was improved lung aeration, with still mild interstitial thickening with small pleural effusions. She noted significant neck pain as well as incisional pain requiring Norco as well as oxycodone prn.   On tele she was noted to be in sinus rhythm.  BP stable, patient to me denies any dizziness with or without position change noted by PT note however.  Her lasix has been stopped yesterday  For UTI, she was initially treated for 2 days with zosyn, then changed to Ceftin and Bionca Mckey be discharged on Ceftin 250 mg bid x 5 days for her UTI per pharmacy recommendation, she Ellean Firman need repeat UA to assess for clearing. She was  initially planned for discharge on 5/20; though upon her families arrival she reported she was not yet ready to go home. She stated she was weak and SOB. Was further diuresed and cumulatively is negtive 7,731m with EF by TTE 60-65%, no WMA, severe LVH and no significant VHD.   She was seen by PT on 5/20, given she had not yet really  been up out of bed, and it was recommended she have HHPT, along with the assistance from her brother who Teola Felipe be in town through the end of June. PT reported the patient could be discharged if she tolerated stairs on 5/21. The patient though continued to complain of generalized weakness and given functional decline, PT has now recommended SNF for rehab.  1 week of oxycodone Gulianna Hornsby be prescribed for her post-implant and neck pain, management of any chronic pain issues Megyn Leng be deferred to her rehab physicians and PMD afterwards.  The patient voices pain at her PPM site though appears very comfortable, and is observed to use her LUE without difficulty or indication of pain.  She Lincy Belles continue low-dose diuresis with PO lasix at home and Leighla Chestnutt resume her home amiodarone and metoprolol for her PAF.  She has noted hx of recurrent UTI's and a chronic foley catheter in place she states is changed monthly Wound care, arm mobility, and restrictions were re-reviewed with the patient.  Follow up visits for wound check and Dr. CCurt Bearshave been made. The patient was examined by Dr. CCurt Bearsand considered stable for discharge to SNF.   INR today is 1.56 after one dose of 2.513m(she was on 72m72mt home and arrived supratherapeutic) continue 2.72mg272mily with INR in 1 week.  Creat today is down some from yesterday, Finnley Larusso need BMET in 1 week  To be done at SNF: Continue Ceftin for total of 5 days at pharmacy recommendation and follow up with UA to ensure clearing she Amariona Rathje need follow up with her urologist given her chronic foley catheter BMET in 1 week INR in 1 week (09/01/15) (a coumadin clinic visit has been arranged for 09/02/15 if needed)   Physical Exam: Filed Vitals:   08/24/15 0500 08/24/15 1557 08/24/15 2200 08/25/15 0500  BP: 91/49 118/72 91/52   Pulse: 74 72 82   Temp: 98.2 F (36.8 C) 98 F (36.7 C) 98.4 F (36.9 C) 97.6 F (36.4 C)  TempSrc: Oral Oral Oral   Resp: 16  16   Height:      Weight: 126 lb  8 oz (57.38 kg)   127 lb 4.8 oz (57.743 kg)  SpO2: 100% 98% 97%     GEN- The patient is frail appearing, in NAD, alert and oriented x 3 today.   HEENT: normocephalic, atraumatic; sclera clear, conjunctiva pink; hearing intact; oropharynx clear; neck supple, no JVP Lungs- Clear to ausculation bilaterally, normal work of breathing.  No wheezes, rales, rhonchi Heart- Regular rate and rhythm, no significant murmurs, rubs or gallops, PMI not laterally displaced GI- soft, non-tender, non-distended, bowel sounds present, no hepatosplenomegaly Extremities- no clubbing, cyanosis, or edema; DP/PT/radial pulses 2-3+ bilaterally MS- no significant deformity or atrophy Skin- warm and dry, no rash or lesion, left chest without hematoma/ecchymosis, PPM site is dry and stable Psych- euthymic mood, full affect Neuro- no gross deficits   Labs:   Lab Results  Component Value Date   WBC 6.4 08/23/2015   HGB 10.4* 08/23/2015   HCT 33.3* 08/23/2015   MCV 86.5 08/23/2015   PLT 224 08/23/2015  Recent Labs Lab 08/25/15 0743  NA 137  K 4.3  CL 97*  CO2 32  BUN 31*  CREATININE 1.60*  CALCIUM 9.9  GLUCOSE 101*    Discharge Medications:    Medication List    STOP taking these medications        diltiazem 120 MG 24 hr capsule  Commonly known as:  CARDIZEM CD     HYDROcodone-acetaminophen 10-325 MG tablet  Commonly known as:  NORCO      TAKE these medications        amiodarone 200 MG tablet  Commonly known as:  PACERONE  Take 1 tablet (200 mg total) by mouth daily.     anastrozole 1 MG tablet  Commonly known as:  ARIMIDEX  TAKE 1 TABLET BY MOUTH EVERY DAY     cefUROXime 250 MG tablet  Commonly known as:  CEFTIN  Take 1 tablet (250 mg total) by mouth 2 (two) times daily with a meal.     feeding supplement Liqd  Take 1 Container by mouth 3 (three) times daily between meals.     furosemide 20 MG tablet  Commonly known as:  LASIX  Take 1 tablet (20 mg total) by mouth 2 (two)  times daily.     latanoprost 0.005 % ophthalmic solution  Commonly known as:  XALATAN  Place 1 drop into both eyes at bedtime.     meclizine 25 MG tablet  Commonly known as:  ANTIVERT  Take 12.5 mg by mouth 3 (three) times daily. Reported on 06/03/2015     metoprolol tartrate 25 MG tablet  Commonly known as:  LOPRESSOR  Take 1 tablet (25 mg total) by mouth 2 (two) times daily.     oxyCODONE 5 MG immediate release tablet  Commonly known as:  Oxy IR/ROXICODONE  Take 1 tablet (5 mg total) by mouth every 4 (four) hours as needed for severe pain.     traZODone 50 MG tablet  Commonly known as:  DESYREL  Take 50 mg by mouth at bedtime.     warfarin 2.5 MG tablet  Commonly known as:  COUMADIN  Take 1 tablet (2.5 mg total) by mouth daily.        Disposition:  SNF when bed available  Discharge Instructions    Diet - low sodium heart healthy    Complete by:  As directed      Increase activity slowly    Complete by:  As directed           Follow-up Information    Follow up with Marlis Oldaker Meredith Leeds, MD On 09/16/2015.   Specialty:  Cardiology   Why:  Appointment time 10:30 AM   Contact information:   [address] [place] [address] [phone]       Follow up with [facility] On 09/04/2015.   Specialty:  Cardiology   Why:  2:30PM, wound check   Contact information:   [address], [place] [phone]      Follow up with [facility] On 09/02/2015.   Specialty:  Cardiology   Why:  coumadin clinic 12:00 noon   Contact information:   [place] [phone]      Duration of Discharge Encounter: Greater than 30 minutes including physician time.  Signed, Tommye Standard, PA-C 08/25/2015 10:30 AM    I have seen and examined this patient with Tommye Standard.  Agree with above, note added to reflect my findings.  On exam, regular rhythm,  no murmurs, lungs clear.  Had dual chamber pacemaker placed for junctional bradycardia.  Was also found to have a UTI.  Significant deconditioning since being in the hospital and thus discharge to SNF.    Calyse Murcia M. Lydiana Milley MD 08/25/2015 1:20 PM

## 2015-08-25 NOTE — Discharge Instructions (Signed)
Supplemental Discharge Instructions for  Pacemaker/Defibrillator Patients  Activity No heavy lifting or vigorous activity with your left/right arm for 6 to 8 weeks.  Do not raise your left/right arm above your head for one week.  Gradually raise your affected arm as drawn below.           NO DRIVING the patient states she does not drive. WOUND CARE - Keep the wound area clean and dry.  Do not get this area wet for one week. No showers for one week; you may shower on                         08/29/15                              . - The tape/steri-strips on your wound will fall off; do not pull them off.  No bandage is needed on the site.  DO  NOT apply any creams, oils, or ointments to the wound area. - If you notice any drainage or discharge from the wound, any swelling or bruising at the site, or you develop a fever > 101? F after you are discharged home, call the office at once.  Special Instructions - You are still able to use cellular telephones; use the ear opposite the side where you have your pacemaker/defibrillator.  Avoid carrying your cellular phone near your device. - When traveling through airports, show security personnel your identification card to avoid being screened in the metal detectors.  Ask the security personnel to use the hand wand. - Avoid arc welding equipment, MRI testing (magnetic resonance imaging), TENS units (transcutaneous nerve stimulators).  Call the office for questions about other devices. - Avoid electrical appliances that are in poor condition or are not properly grounded. - Microwave ovens are safe to be near or to operate.  Additional information for defibrillator patients should your device go off: - If your device goes off ONCE and you feel fine afterward, notify the device clinic nurses. - If your device goes off ONCE and you do not feel well afterward, call 911. - If your device goes off TWICE, call 911. - If your device goes off THREE times in one  day, call 911.  DO NOT DRIVE YOURSELF OR A FAMILY MEMBER WITH A DEFIBRILLATOR TO THE HOSPITAL--CALL 911.   ----------------------------------------- Information on my medicine - Coumadin   (Warfarin)  This medication education was reviewed with me or my healthcare representative as part of my discharge preparation.  The pharmacist that spoke with me during my hospital stay was:  Jens Som, RPH  Why was Coumadin prescribed for you? Coumadin was prescribed for you because you have a blood clot or a medical condition that can cause an increased risk of forming blood clots. Blood clots can cause serious health problems by blocking the flow of blood to the heart, lung, or brain. Coumadin can prevent harmful blood clots from forming. As a reminder your indication for Coumadin is:   Stroke Prevention Because Of Atrial Fibrillation  What test will check on my response to Coumadin? While on Coumadin (warfarin) you will need to have an INR test regularly to ensure that your dose is keeping you in the desired range. The INR (international normalized ratio) number is calculated from the result of the laboratory test called prothrombin time (PT).  If an INR APPOINTMENT HAS NOT ALREADY BEEN MADE  FOR YOU please schedule an appointment to have this lab work done by your health care provider within 7 days. Your INR goal is usually a number between:  2 to 3 or your provider may give you a more narrow range like 2-2.5.  Ask your health care provider during an office visit what your goal INR is.  What  do you need to  know  About  COUMADIN? Take Coumadin (warfarin) exactly as prescribed by your healthcare provider about the same time each day.  DO NOT stop taking without talking to the doctor who prescribed the medication.  Stopping without other blood clot prevention medication to take the place of Coumadin may increase your risk of developing a new clot or stroke.  Get refills before you run out.  What  do you do if you miss a dose? If you miss a dose, take it as soon as you remember on the same day then continue your regularly scheduled regimen the next day.  Do not take two doses of Coumadin at the same time.  Important Safety Information A possible side effect of Coumadin (Warfarin) is an increased risk of bleeding. You should call your healthcare provider right away if you experience any of the following: ? Bleeding from an injury or your nose that does not stop. ? Unusual colored urine (red or dark brown) or unusual colored stools (red or black). ? Unusual bruising for unknown reasons. ? A serious fall or if you hit your head (even if there is no bleeding).  Some foods or medicines interact with Coumadin (warfarin) and might alter your response to warfarin. To help avoid this: ? Eat a balanced diet, maintaining a consistent amount of Vitamin K. ? Notify your provider about major diet changes you plan to make. ? Avoid alcohol or limit your intake to 1 drink for women and 2 drinks for men per day. (1 drink is 5 oz. wine, 12 oz. beer, or 1.5 oz. liquor.)  Make sure that ANY health care provider who prescribes medication for you knows that you are taking Coumadin (warfarin).  Also make sure the healthcare provider who is monitoring your Coumadin knows when you have started a new medication including herbals and non-prescription products.  Coumadin (Warfarin)  Major Drug Interactions  Increased Warfarin Effect Decreased Warfarin Effect  Alcohol (large quantities) Antibiotics (esp. Septra/Bactrim, Flagyl, Cipro) Amiodarone (Cordarone) Aspirin (ASA) Cimetidine (Tagamet) Megestrol (Megace) NSAIDs (ibuprofen, naproxen, etc.) Piroxicam (Feldene) Propafenone (Rythmol SR) Propranolol (Inderal) Isoniazid (INH) Posaconazole (Noxafil) Barbiturates (Phenobarbital) Carbamazepine (Tegretol) Chlordiazepoxide (Librium) Cholestyramine (Questran) Griseofulvin Oral  Contraceptives Rifampin Sucralfate (Carafate) Vitamin K   Coumadin (Warfarin) Major Herbal Interactions  Increased Warfarin Effect Decreased Warfarin Effect  Garlic Ginseng Ginkgo biloba Coenzyme Q10 Green tea St. Johns wort    Coumadin (Warfarin) FOOD Interactions  Eat a consistent number of servings per week of foods HIGH in Vitamin K (1 serving =  cup)  Collards (cooked, or boiled & drained) Kale (cooked, or boiled & drained) Mustard greens (cooked, or boiled & drained) Parsley *serving size only =  cup Spinach (cooked, or boiled & drained) Swiss chard (cooked, or boiled & drained) Turnip greens (cooked, or boiled & drained)  Eat a consistent number of servings per week of foods MEDIUM-HIGH in Vitamin K (1 serving = 1 cup)  Asparagus (cooked, or boiled & drained) Broccoli (cooked, boiled & drained, or raw & chopped) Brussel sprouts (cooked, or boiled & drained) *serving size only =  cup Lettuce, raw (green leaf,  endive, romaine) Spinach, raw Turnip greens, raw & chopped   These websites have more information on Coumadin (warfarin):  FailFactory.se; VeganReport.com.au;

## 2015-08-25 NOTE — Clinical Social Work Note (Signed)
Clinical Social Work Assessment  Patient Details  Name: Natasha Fletcher MRN: [id number] Date of Birth: May 01, 1925  Date of referral:  08/25/15               Reason for consult:  Facility Placement                Permission sought to share information with:  Family Supports Permission granted to share information::  Yes, Verbal Permission Granted  Relationship::  Sister  Contact Information:  [phone]  Housing/Transportation Living arrangements for the past 2 months:  Single Family Home Source of Information:  Patient Patient Interpreter Needed:  None Criminal Activity/Legal Involvement Pertinent to Current Situation/Hospitalization:  No - Comment as needed Significant Relationships:  Siblings Lives with:  Siblings Do you feel safe going back to the place where you live?  Yes Need for family participation in patient care:  Yes (Comment)  Care giving concerns:  Patient brother at bedside and does not verbalize any concerns regarding patient discharge planning.   Social Worker assessment / plan:  Holiday representative met with patient and patient brother to offer support and discuss patient needs at discharge.  Patient states that she lives at home with her brother and sister and has plans to return home with her siblings following a short SNF stay.  Patient states that she has been to [facility] in the past and is agreeable to either facility.  CSW initiated referral and received notification from  [facility] that patient had been accepted.  Patient remains in agreement and plans to do paperwork and discharge today.  Patient to update family on discharge plans.  CSW remains available for support and to facilitate transport at discharge.  Employment status:  Retired Nurse, adult PT Recommendations:  [place] / Referral to community resources:  [place]  Patient/Family's Response to care:   Patient verbalized appreciation for CSW support and concern regarding patient needs at discharge.  Patient is agreeable with ST-SNF placement at [facility] and/or [facility].  Patient/Family's Understanding of and Emotional Response to Diagnosis, Current Treatment, and Prognosis:  Patient with a realistic view of current limitations and ability to return home with her siblings.  Patient with good support and plans to return home with family as soon as possible.  Emotional Assessment Appearance:  Appears younger than stated age Attitude/Demeanor/Rapport:   (Appropriate and Engaged) Affect (typically observed):  Appropriate, Calm, Hopeful Orientation:  Oriented to Place, Oriented to Self, Oriented to  Time, Oriented to Situation Alcohol / Substance use:  Not Applicable Psych involvement (Current and /or in the community):  No (Comment)  Discharge Needs  Concerns to be addressed:  Discharge Planning Concerns Readmission within the last 30 days:  No Current discharge risk:  None Barriers to Discharge:  Continued Medical Work up, [employer], Post

## 2015-08-25 NOTE — Care Management Note (Signed)
Case Management Note  Patient Details  Name: Natasha Fletcher MRN: [id number] Date of Birth: 1926-02-16  Subjective/Objective:      Pt admitted for [employer] [employer] pacemaker. Per RN- family and pt is agreeable to SNF placement.               Action/Plan: Pt asleep at the time of visit. No family available. Per Staff RN brother just left from visiting and wanted SNF. CSW is aware of plan. No further needs from CM at this time.   Expected Discharge Date:                  Expected Discharge Plan:  Skilled Nursing Facility  In-House Referral:  Clinical Social Work  Discharge planning Services  CM Consult  Post Acute Care Choice:  NA Choice offered to:  NA  DME Arranged:  N/A DME Agency:  NA  HH Arranged:  NA HH Agency:  NA  Status of Service:  Completed, signed off  Medicare Important Message Given:    Date Medicare IM Given:    Medicare IM give by:    Date Additional Medicare IM Given:    Additional Medicare Important Message give by:     If discussed at [place] of Stay Meetings, dates discussed:    Additional Comments:  Bethena Roys, RN 08/25/2015, 11:42 AM

## 2015-08-25 NOTE — Progress Notes (Signed)
[place] for coumadin Indication: atrial fibrillation  Allergies  Allergen Reactions  . Lactose Intolerance (Gi) Other (See Comments)    Stomach pain  . Milk-Related Compounds Other (See Comments)    Upset stomach   . Sulfa Antibiotics Itching    Patient Measurements: Height: 5\' 6"  (167.6 cm) Weight: 127 lb 4.8 oz (57.743 kg) IBW/kg (Calculated) : 59.3 Heparin Dosing Weight:   Vital Signs: Temp: 97.6 F (36.4 C) (05/22 0500)  Labs:  Recent Labs  08/22/15 1214 08/23/15 0558 08/23/15 1000 08/24/15 0414 08/25/15 0436 08/25/15 0743  HGB  --  10.4*  --   --   --   --   HCT  --  33.3*  --   --   --   --   PLT  --  224  --   --   --   --   LABPROT 42.1*  --  17.5*  --  18.8*  --   INR 4.59*  --  1.43  --  1.56*  --   CREATININE  --   --   --  1.71*  --  1.60*    Estimated Creatinine Clearance: 22.6 mL/min (by C-G formula based on Cr of 1.6).   Medical History: Past Medical History  Diagnosis Date  . Hypertension   . Asthma   . Glaucoma   . Incontinence   . Wears dentures   . Wears glasses   . Thyroid disease     "something years ago"  . GERD (gastroesophageal reflux disease)   . Cancer (HCC)     rt breast  . Edema leg   . Fast breathing     "because of pill"  . Pneumonia     h/o younger  . Arthritis     "all over"  . S/P total knee arthroplasty   . Glaucoma   . Swelling     bilateral feet/ legs, more left.  . Atrial fibrillation ([place])   . Diabetes mellitus without complication ([place])   . UTI (urinary tract infection) 02/12/2015  . CHF (congestive heart failure) (HCC)     Medications:  Scheduled:  . sodium chloride   Intravenous Once  . anastrozole  1 mg Oral Daily  . cefUROXime  250 mg Oral BID WC  . latanoprost  1 drop Both Eyes QHS  . sodium chloride flush  3 mL Intravenous Q12H  . traZODone  50 mg Oral QHS  . Warfarin - Pharmacist Dosing Inpatient   Does not apply q1800   Infusions:     Assessment: 82 yo who was admitted for a pacer exchange. She was on coumadin for afib. Admission INR was 5 so she got 1 dose of vit k for reversal. Repeat INR was 1.43 yesterday. Plan was to dc home but got canceled. Warfarin restarted on 5/21. She is on amiodarone.  PO intake is variable 25-80%  Goal of Therapy:  INR 2-3 Monitor platelets by anticoagulation protocol: Yes   Plan:  Warfarin 2.5mg  PO daily Monitor INR, CBC, clinical course, s/sx of bleed, PO intake, DDI   Thank you for allowing [place] to participate in this patients care. Jens Som, PharmD Pager: [phone]  08/25/2015 12:09 PM

## 2015-08-25 NOTE — Care Management Important Message (Signed)
Important Message  Patient Details  Name: Natasha Fletcher MRN: [id number] Date of Birth: [birth year]   Medicare Important Message Given:  Yes    [name] [place] 08/25/2015, 1:45 PM

## 2015-08-25 NOTE — NC FL2 (Signed)
[place] MEDICAID FL2 LEVEL OF CARE SCREENING TOOL     IDENTIFICATION  [name] Birthdate: Aug 26, 1925 Sex: female Admission Date (Current Location): 08/21/2015  [facility] and [place] Number:  Herbalist and Address:  [facility] [place], [place] [address] [place], [place] [address]      Provider Number: [id number]  Attending Physician Name and Address:  Will Meredith Leeds, MD  Relative Name and Phone Number:       Current Level of Care: Hospital Recommended Level of Care: [place] Prior Approval Number:    Date Approved/Denied:   PASRR Number: [id number]  Discharge Plan: SNF    Current Diagnoses: Patient Active Problem List   Diagnosis Date Noted  . Bradycardia 08/22/2015  . Junctional (nodal) bradycardia 08/22/2015  . Acute systolic congestive heart failure ([place])   . Malnutrition of moderate degree 03/19/2015  . Benign essential HTN   . Bradycardia, drug induced 03/18/2015  . Hypokalemia 02/16/2015  . Thrombocytopenia ([place]) 02/16/2015  . Sepsis due to urinary tract infection ([place])   . Sepsis ([place]) 02/12/2015  . Urinary tract infectious disease   . Long-term (current) use of anticoagulants 07/12/2014  . Chronic anticoagulation 02/11/2014  . Hypertensive cardiovascular disease 01/24/2014  . Chronic diastolic CHF (congestive heart failure) ([place]) 01/24/2014  . Anemia 01/24/2014  . Breast cancer of upper-inner quadrant of right female breast ([place]) 01/24/2014  . Anticoagulated 12/31/2013  . Acute diastolic heart failure ([place]) 11/11/2013  . Atrial flutter ([place]) 11/11/2013  . PAF (paroxysmal atrial fibrillation) ([place]) 11/10/2013  . Slurred speech 11/10/2013  . Protein-calorie malnutrition, severe ([place]) 11/06/2013  . Coffee ground emesis 11/05/2013  . Dehydration 10/19/2013  . Urinary retention 10/18/2013  . GERD (gastroesophageal reflux disease) 03/20/2013  . S/P total knee arthroplasty   . UTI (urinary  tract infection) 02/21/2013  . OA (osteoarthritis) of knee 02/19/2013  . Hypertension   . Thyroid disease   . Cancer of central portion of female breast ([place]) 04/14/2011    Orientation RESPIRATION BLADDER Height & Weight     Self, Time, Situation, Place  Normal Continent Weight: 127 lb 4.8 oz (57.743 kg) Height:  5\' 6"  (167.6 cm)  BEHAVIORAL SYMPTOMS/MOOD NEUROLOGICAL BOWEL NUTRITION STATUS      Continent Diet (Diet 2 gram sodium)  AMBULATORY STATUS COMMUNICATION OF NEEDS Skin   Extensive Assist Verbally Surgical wounds (Chest Incision)                       Personal Care Assistance Level of Assistance  Bathing, Feeding, Dressing Bathing Assistance: Limited assistance Feeding assistance: Independent Dressing Assistance: Limited assistance     Functional Limitations Info  Sight, Hearing, Speech Sight Info: Adequate Hearing Info: Adequate Speech Info: Adequate    SPECIAL CARE FACTORS FREQUENCY  PT (By licensed PT), OT (By licensed OT)     PT Frequency: 3 OT Frequency: 3            Contractures Contractures Info: Not present    Additional Factors Info  Code Status, Allergies Code Status Info: Full Code Allergies Info: Lactose Intolerance (Gi), Milk-related Compounds, Sulfa Antibiotics           Current Medications (08/25/2015):  This is the current hospital active medication list Current Facility-Administered Medications  Medication Dose Route Frequency Provider Last Rate Last Dose  . 0.9 %  sodium chloride infusion  250 mL Intravenous PRN Larey Dresser, MD      .  0.9 %  sodium chloride infusion   Intravenous Once Patsey Berthold, NP      . acetaminophen (TYLENOL) tablet 325-650 mg  325-650 mg Oral Q4H PRN Will Meredith Leeds, MD      . anastrozole (ARIMIDEX) tablet 1 mg  1 mg Oral Daily Larey Dresser, MD   1 mg at 08/25/15 1001  . cefUROXime (CEFTIN) tablet 250 mg  250 mg Oral BID WC Will Meredith Leeds, MD   250 mg at 08/25/15 1001  .  HYDROcodone-acetaminophen (NORCO) 10-325 MG per tablet 1 tablet  1 tablet Oral BID PRN Larey Dresser, MD   1 tablet at 08/24/15 2111  . latanoprost (XALATAN) 0.005 % ophthalmic solution 1 drop  1 drop Both Eyes QHS Larey Dresser, MD   1 drop at 08/24/15 2111  . ondansetron (ZOFRAN) injection 4 mg  4 mg Intravenous Q6H PRN Will Meredith Leeds, MD      . oxyCODONE (Oxy IR/ROXICODONE) immediate release tablet 5 mg  5 mg Oral Q4H PRN Will Meredith Leeds, MD   5 mg at 08/25/15 0600  . sodium chloride flush (NS) 0.9 % injection 3 mL  3 mL Intravenous Q12H Larey Dresser, MD   3 mL at 08/24/15 1000  . sodium chloride flush (NS) 0.9 % injection 3 mL  3 mL Intravenous PRN Larey Dresser, MD      . traZODone (DESYREL) tablet 50 mg  50 mg Oral QHS Larey Dresser, MD   50 mg at 08/24/15 2111  . warfarin (COUMADIN) tablet 2.5 mg  2.5 mg Oral ONCE-1800 Will Meredith Leeds, MD      . Warfarin - Pharmacist Dosing Inpatient   Does not apply q1800 Will Meredith Leeds, MD   0  at 08/24/15 1800     Discharge Medications: Please see discharge summary for a list of discharge medications.  Relevant Imaging Results:  Relevant Lab Results:   Additional Information SSN  [id number]  Barbette Or, [place]

## 2015-08-25 NOTE — Progress Notes (Signed)
[place] to give report on patient going to room 303. Report given to [facility].

## 2015-08-25 NOTE — Clinical Social Work Note (Signed)
Clinical Social Worker facilitated patient discharge including contacting patient family and facility to confirm patient discharge plans.  Clinical information faxed to facility and family agreeable with plan.  CSW arranged ambulance transport via [facility] to [facility].  RN to call report prior to discharge.  Clinical Social Worker will sign off for now as social work intervention is no longer needed. Please consult us again if new need arises.  Jesse Ardean Simonich, LCSW [phone] 

## 2015-08-25 NOTE — Clinical Social Work Placement (Signed)
   CLINICAL SOCIAL WORK PLACEMENT  NOTE  Date:  08/25/2015  Patient Details  Name: Natasha Fletcher MRN: [id number] Date of Birth: 05-Jun-1925  Clinical Social Work is seeking post-discharge placement for this patient at the [place] level of care (*CSW will initial, date and re-position this form in  chart as items are completed):  Yes   Patient/family provided with [facility] Work Department's list of facilities offering this level of care within the geographic area requested by the patient (or if unable, by the patient's family).  Yes   Patient/family informed of their freedom to choose among providers that offer the needed level of care, that participate in Medicare, Medicaid or managed care program needed by the patient, have an available bed and are willing to accept the patient.  Yes   Patient/family informed of [facility]'s ownership interest in [facility] and [facility], as well as of the fact that they are under no obligation to receive care at these facilities.  PASRR submitted to EDS on 08/25/15     PASRR number received on 08/25/15     Existing PASRR number confirmed on 08/25/15     FL2 transmitted to all facilities in geographic area requested by pt/family on       FL2 transmitted to all facilities within larger geographic area on       Patient informed that his/her managed care company has contracts with or will negotiate with certain facilities, including the following:        Yes   Patient/family informed of bed offers received.  Patient chooses bed at [facility]     Physician recommends and patient chooses bed at      Patient to be transferred to [facility] on 08/25/15.  Patient to be transferred to facility by Ambulance     Patient family notified on 08/25/15 of transfer.  Name of family member notified:  Patient brother [name] at bedside     PHYSICIAN       Additional Comment:    Barbette Or,  [place]

## 2015-08-26 ENCOUNTER — Encounter: Payer: Self-pay | Admitting: Internal Medicine

## 2015-08-26 ENCOUNTER — Non-Acute Institutional Stay (SKILLED_NURSING_FACILITY): Payer: Medicare Other | Admitting: Internal Medicine

## 2015-08-26 DIAGNOSIS — K5900 Constipation, unspecified: Secondary | ICD-10-CM | POA: Diagnosis not present

## 2015-08-26 DIAGNOSIS — G8912 Acute post-thoracotomy pain: Secondary | ICD-10-CM

## 2015-08-26 DIAGNOSIS — E46 Unspecified protein-calorie malnutrition: Secondary | ICD-10-CM

## 2015-08-26 DIAGNOSIS — N3000 Acute cystitis without hematuria: Secondary | ICD-10-CM | POA: Diagnosis not present

## 2015-08-26 DIAGNOSIS — N183 Chronic kidney disease, stage 3 unspecified: Secondary | ICD-10-CM

## 2015-08-26 DIAGNOSIS — I5032 Chronic diastolic (congestive) heart failure: Secondary | ICD-10-CM

## 2015-08-26 DIAGNOSIS — R0789 Other chest pain: Secondary | ICD-10-CM

## 2015-08-26 DIAGNOSIS — R339 Retention of urine, unspecified: Secondary | ICD-10-CM | POA: Diagnosis not present

## 2015-08-26 DIAGNOSIS — I4891 Unspecified atrial fibrillation: Secondary | ICD-10-CM

## 2015-08-26 DIAGNOSIS — G8918 Other acute postprocedural pain: Secondary | ICD-10-CM

## 2015-08-26 DIAGNOSIS — Z5181 Encounter for therapeutic drug level monitoring: Secondary | ICD-10-CM

## 2015-08-26 DIAGNOSIS — H8113 Benign paroxysmal vertigo, bilateral: Secondary | ICD-10-CM

## 2015-08-26 DIAGNOSIS — Z7901 Long term (current) use of anticoagulants: Secondary | ICD-10-CM

## 2015-08-26 DIAGNOSIS — R5381 Other malaise: Secondary | ICD-10-CM | POA: Diagnosis not present

## 2015-08-26 DIAGNOSIS — D638 Anemia in other chronic diseases classified elsewhere: Secondary | ICD-10-CM | POA: Diagnosis not present

## 2015-08-26 DIAGNOSIS — R001 Bradycardia, unspecified: Secondary | ICD-10-CM | POA: Diagnosis not present

## 2015-08-26 DIAGNOSIS — C50211 Malignant neoplasm of upper-inner quadrant of right female breast: Secondary | ICD-10-CM

## 2015-08-26 NOTE — Progress Notes (Signed)
LOCATION: [facility]  PCP: Myriam Jacobson, MD   Code Status: Full Code  Goals of care: Advanced Directive information Advanced Directives 08/21/2015  Does patient have an advance directive? Yes  Type of Paramedic of [place];Living will  Copy of advanced directive(s) in chart? -       Extended Emergency Contact Information Primary Emergency Contact: Id-Deem,[name] Address: [address]          [place], [place] [address] Johnnette Litter of [place] Phone: [phone] Relation: Brother Secondary Emergency Contact: Murray,Peggy Address: [address]          [place], [place] [address] [place] Phone: [phone] Relation: Sister   Allergies  Allergen Reactions  . Lactose Intolerance (Gi) Other (See Comments)    Stomach pain  . Milk-Related Compounds Other (See Comments)    Upset stomach   . Sulfa Antibiotics Itching   Chief Complaint  Patient presents with  . New Admit To SNF     HPI:  Patient is a 82 y.o. female is here for short term rehabilitation post hospital admission from 08/21/15-08/25/15 with symptomatic bradycardia. She had pacemaker implantation on 08/22/15. Her cardizem and metoprolol were discontinued. She had mild pulmonary edema and required diuresis. She was treated for UTI this admission with antibiotics. She has PMH of afib, chronic diastolic chf, HTN. She is seen in her room today.  Review of Systems:  Constitutional: Negative for fever, chills, diaphoresis. Feels weak and tired.  HENT: Negative for headache, congestion, sore throat, difficulty swallowing. Positive for nasal discharge.   Eyes: Negative for blurred vision, double vision and discharge. Wears glasses.   Respiratory: Negative for cough and wheezing. Positive for shortness of breath with movement.   Cardiovascular: Negative for chest pain, palpitations, leg swelling.  Gastrointestinal: Negative for heartburn, nausea, vomiting,  abdominal pain. Last bowel movement was last Wednesday. Genitourinary: Negative for dysuria. Musculoskeletal: Negative for back pain, fall in the facility.  Skin: Negative for itching, rash.  Neurological: Positive for dizziness. Psychiatric/Behavioral: Negative for depression   Past Medical History  Diagnosis Date  . Hypertension   . Asthma   . Glaucoma   . Incontinence   . Wears dentures   . Wears glasses   . Thyroid disease     "something years ago"  . GERD (gastroesophageal reflux disease)   . Cancer (HCC)     rt breast  . Edema leg   . Fast breathing     "because of pill"  . Pneumonia     h/o younger  . Arthritis     "all over"  . S/P total knee arthroplasty   . Glaucoma   . Swelling     bilateral feet/ legs, more left.  . Atrial fibrillation ([facility])   . Diabetes mellitus without complication ([facility])   . UTI (urinary tract infection) 02/12/2015  . CHF (congestive heart failure) [facility])    Past Surgical History  Procedure Laterality Date  . Total hip arthroplasty Bilateral   . Shoulder surgery Left   . Appendectomy    . Carpal tunnel release Bilateral   . Tonsillectomy    . Colonoscopy    . Abdominal hysterectomy    . Eye surgery      both cataracts  . Breast biopsy  04/27/2011    Procedure: BREAST BIOPSY WITH NEEDLE LOCALIZATION;  Surgeon: Edward Jolly, MD;  Location: [facility];  Service: General;  Laterality: Right;  right needle localized  breast lumpectomy   . Breast lumpectomy  04/27/11    right breast by [facility]  . Total knee arthroplasty Right 02/19/2013    Procedure: RIGHT TOTAL KNEE ARTHROPLASTY;  Surgeon: Gearlean Alf, MD;  Location: [facility];  Service: Orthopedics;  Laterality: Right;  . Total knee arthroplasty Left 10/15/2013    Procedure: LEFT TOTAL KNEE ARTHROPLASTY;  Surgeon: Gearlean Alf, MD;  Location: [facility];  Service: Orthopedics;  Laterality: Left;  . Cardioversion N/A 02/01/2014    Procedure: CARDIOVERSION;  Surgeon:  Candee Furbish, MD;  Location: [facility];  Service: Cardiovascular;  Laterality: N/A;  . Tee without cardioversion N/A 02/01/2014    Procedure: TRANSESOPHAGEAL ECHOCARDIOGRAM (TEE);  Surgeon: Candee Furbish, MD;  Location: [facility];  Service: Cardiovascular;  Laterality: N/A;  . Cardioversion N/A 06/04/2015    Procedure: CARDIOVERSION;  Surgeon: Dorothy Spark, MD;  Location: [facility];  Service: Cardiovascular;  Laterality: N/A;  . Cardioversion N/A 07/14/2015    Procedure: CARDIOVERSION;  Surgeon: Thayer Headings, MD;  Location: [facility];  Service: Cardiovascular;  Laterality: N/A;  . Ep implantable device N/A 08/22/2015    Procedure: Pacemaker Implant;  Surgeon: Will Meredith Leeds, MD;  Location: [facility];  Service: Cardiovascular;  Laterality: N/A;   Social History:   reports that she quit smoking about 40 years ago. Her smoking use included Cigarettes. She smoked 0.00 packs per day for 30 years. She has never used smokeless tobacco. She reports that she drinks alcohol. She reports that she does not use illicit drugs.  Family History  Problem Relation Age of Onset  . Heart disease Mother   . Stroke Father   . Heart attack Neg Hx   . Stroke Mother   . Stroke Brother   . Hypertension Mother   . Hypertension Father     Medications:   Medication List       This list is accurate as of: 08/26/15  4:05 PM.  Always use your most recent med list.               amiodarone 200 MG tablet  Commonly known as:  PACERONE  Take 1 tablet (200 mg total) by mouth daily.     anastrozole 1 MG tablet  Commonly known as:  ARIMIDEX  TAKE 1 TABLET BY MOUTH EVERY DAY     cefUROXime 250 MG tablet  Commonly known as:  CEFTIN  Take 1 tablet (250 mg total) by mouth 2 (two) times daily with a meal.     furosemide 20 MG tablet  Commonly known as:  LASIX  Take 1 tablet (20 mg total) by mouth 2 (two) times daily.     latanoprost 0.005 % ophthalmic solution  Commonly known as:   XALATAN  Place 1 drop into both eyes at bedtime.     meclizine 25 MG tablet  Commonly known as:  ANTIVERT  Take 12.5 mg by mouth 3 (three) times daily. Reported on 06/03/2015     metoprolol tartrate 25 MG tablet  Commonly known as:  LOPRESSOR  Take 1 tablet (25 mg total) by mouth 2 (two) times daily.     oxyCODONE 5 MG immediate release tablet  Commonly known as:  Oxy IR/ROXICODONE  Take 1 tablet (5 mg total) by mouth every 4 (four) hours as needed for severe pain.     traZODone 50 MG tablet  Commonly known as:  DESYREL  Take 50 mg by mouth at bedtime.  Immunizations: Immunization History  Administered Date(s) Administered  . Influenza-Unspecified 01/03/2013  . PPD Test 08/25/2015  . Pneumococcal-Unspecified 04/05/2012     Physical Exam: Filed Vitals:   08/26/15 1601  BP: 133/74  Pulse: 66  Temp: 97.8 F (36.6 C)  TempSrc: Oral  Resp: 18  Height: 5\' 6"  (1.676 m)  Weight: 127 lb (57.607 kg)  SpO2: 98%   Body mass index is 20.51 kg/(m^2).  General- elderly female, thin built, in no acute distress Head- normocephalic, atraumatic Nose- no maxillary or frontal sinus tenderness, no nasal discharge Throat- moist mucus membrane, has dentures Eyes- PERRLA, EOMI, no pallor, no icterus, no discharge, normal conjunctiva, normal sclera Neck- no cervical lymphadenopathy Cardiovascular- normal s1,s2, no murmur, trace leg edema Respiratory- bilateral clear to auscultation, no wheeze, no rhonchi, no crackles, no use of accessory muscles Abdomen- bowel sounds present, soft, non tender, foley catheter in place with good urine output Musculoskeletal- able to move all 4 extremities, generalized weakness, left arm in sling, has arthritis changes to her fingers Neurological- alert and oriented to person, place and time Skin- warm and dry, surgical site with steri strips to left chest wall area Psychiatry- normal mood and affect    Labs reviewed: Basic Metabolic  Panel:  Recent Labs  02/16/15 0920 02/17/15 0640  08/22/15 0244 08/22/15 1214 08/24/15 0414 08/25/15 0743  NA 135 137  < > 141  --  137 137  K 3.3* 3.6  < > 4.4  --  4.6 4.3  CL 95* 102  < > 104  --  97* 97*  CO2 28 26  < > 26  --  29 32  GLUCOSE 125* 91  < > 102*  --  96 101*  BUN 17 15  < > 32*  --  30* 31*  CREATININE 1.35* 0.91  < > 1.39*  --  1.71* 1.60*  CALCIUM 8.9 8.9  < > 9.7  --  9.4 9.9  MG 1.9 1.7  --   --  2.1  --   --   < > = values in this interval not displayed. Liver Function Tests:  Recent Labs  02/15/15 0620 02/16/15 0920 06/03/15 0825  AST 115* 84* 38*  ALT 78* 77* 30  ALKPHOS 104 93 100  BILITOT 1.9* 1.4* 1.01  PROT 6.1* 6.3* 6.7  ALBUMIN 3.1* 3.2* 3.6    Recent Labs  09/01/14 1630  LIPASE 16*    Recent Labs  02/16/15 1540  AMMONIA <9*   CBC:  Recent Labs  06/03/15 0825 06/24/15 1155 07/03/15 1305 08/21/15 2322 08/21/15 2331 08/22/15 0244 08/23/15 0558  WBC 5.4 4.2 4.7 5.7  --  5.7 6.4  NEUTROABS 3.8 2.4 3.0  --   --   --   --   HGB 12.3 11.8* 12.7 10.3* 12.9 10.7* 10.4*  HCT 38.7 37.2 38.6 32.9* 38.0 34.6* 33.3*  MCV 86.9 85.3 86.2 85.7  --  85.2 86.5  PLT 199 257 264 260  --  256 224     Radiological Exams: Dg Chest 2 View  08/23/2015  CLINICAL DATA:  Cardiac device in situ. Hx of HTN, asthma, PNA, AFIB, DM, & CHF. EXAM: CHEST  2 VIEW COMPARISON:  08/21/2015 FINDINGS: New left anterior chest wall sequential pacemaker is well positioned with its leads projecting in the right atrium and right ventricle. No pneumothorax. Cardiac silhouette is mildly enlarged. No mediastinal or hilar masses. No convincing adenopathy. Lungs show mild interstitial thickening. There are small pleural effusions.  IMPRESSION: 1. New pacemaker is well positioned.  No pneumothorax. 2. Improved lung aeration when compared to the prior study. There is still mild interstitial thickening with small pleural effusions consistent with mild residual congestive  heart failure. Electronically Signed   By: Lajean Manes M.D.   On: 08/23/2015 08:48   Dg Chest Portable 1 View  08/21/2015  CLINICAL DATA:  Initial valuation for 3 day history of acute shortness of breath. Lower extremity swelling. EXAM: PORTABLE CHEST 1 VIEW COMPARISON:  Prior study from 05/09/2015. FINDINGS: To particular but later pat it is overlie the left chest. Moderate cardiomegaly is stable. Mediastinal silhouette within normal limits. Diffuse pulmonary vascular congestion with indistinctness of the nursing show markings, consistent with moderate diffuse pulmonary edema. Small bilateral pleural effusions present. Findings consistent with acute CHF exacerbation. No definite focal infiltrates. No pneumothorax. Diffuse osteopenia noted.  No acute osseous abnormality. IMPRESSION: Cardiomegaly with moderate diffuse pulmonary edema and bilateral pleural effusions, consistent with acute CHF exacerbation. Electronically Signed   By: Jeannine Boga M.D.   On: 08/21/2015 23:48    Assessment/Plan  Physical deconditioning Will have her work with physical therapy and occupational therapy team to help with gait training and muscle strengthening exercises.fall precautions. Skin care. Encourage to be out of bed.   Bradycardia At present rate under control and she has a pacemaker. Has f/u with cardiology. Monitor clinically. Decrease dosing of metoprolol as below.  Acute cystitis Continue foley care. Continue ceftin 250 mg bid until 08/30/15.   Protein calorie malnutrition Continue feeding supplement and monitor po intake with weight. Get RD consult  afib Rate controlled. Continue amiodarone for now. With her weakness and low HR decrease her lopressor to 12.5 mg bid and monitor bp and HR bid for now. Continue coumadin.  subtherapeutic inr inr today 1.7. Change coumadin to 3 mg daily and check inr 08/28/15  Chest wall soreness Continue oxyIR 5 mg q4h prn pain and monitor  CHF Stable, appears  euvolemic. continue lasix but change from 20 mg bid to 20 mg daily and monitor bmp and weight  Vertigo Stable, continue meclizine tid and check orthostatic BP reading  Constipation Add miralax daily with senna s 1 tab bid. Encourage hydration  History of breast cancer Stable, continue anastrozole  ckd stage 3 Monitor bmp and avoid NSAIDs.  Anemia of chronic disease Monitor cbc  Urinary retention Has foley catheter in place, continue catheter care. Get urology follow up   Goals of care: short term rehabilitation   Labs/tests ordered: cbc, cmp 08/28/15  Family/ staff Communication: reviewed care plan with patient and nursing supervisor    Blanchie Serve, MD Internal Medicine [place], [place] [address] Cell Phone (Monday-Friday 8 am - 5 pm): [phone] On Call: [phone] and follow prompts after 5 pm and on weekends Office Phone: [phone] Office Fax: [phone]

## 2015-08-27 LAB — HEPATIC FUNCTION PANEL
ALK PHOS: 119 U/L (ref 25–125)
ALT: 12 U/L (ref 7–35)
AST: 30 U/L (ref 13–35)
Bilirubin, Total: 0.6 mg/dL

## 2015-08-27 LAB — CBC AND DIFFERENTIAL
HCT: 36 % (ref 36–46)
HEMOGLOBIN: 11.1 g/dL — AB (ref 12.0–16.0)
Neutrophils Absolute: 3 /uL
Platelets: 197 10*3/uL (ref 150–399)
WBC: 4.8 10^3/mL

## 2015-08-27 LAB — BASIC METABOLIC PANEL
BUN: 43 mg/dL — AB (ref 4–21)
CREATININE: 1.4 mg/dL — AB (ref 0.5–1.1)
Glucose: 83 mg/dL
Potassium: 4.1 mmol/L (ref 3.4–5.3)
SODIUM: 139 mmol/L (ref 137–147)

## 2015-08-28 ENCOUNTER — Non-Acute Institutional Stay (SKILLED_NURSING_FACILITY): Payer: Medicare Other | Admitting: Adult Health

## 2015-08-28 ENCOUNTER — Encounter: Payer: Self-pay | Admitting: Adult Health

## 2015-08-28 DIAGNOSIS — I480 Paroxysmal atrial fibrillation: Secondary | ICD-10-CM | POA: Diagnosis not present

## 2015-08-28 DIAGNOSIS — Z7901 Long term (current) use of anticoagulants: Secondary | ICD-10-CM

## 2015-08-29 NOTE — Progress Notes (Signed)
Patient ID: Natasha Fletcher, female   DOB: 03-01-1926, 82 y.o.   MRN: [id number] Subjective:     Indication: atrial fibrillation Bleeding signs/symptoms: None Thromboembolic signs/symptoms: None  Missed Coumadin doses: None Medication changes: no Dietary changes: no Bacterial/viral infection: no Other concerns: no  The following portions of the patient's history were reviewed and updated as appropriate: allergies, current medications, past family history, past medical history, past social history, past surgical history and problem list.  Review of Systems A comprehensive review of systems was negative.   Objective:    INR Today: 1.5 Current dose: Coumadin 3 mg daily     Assessment:    Subtherapeutic INR for goal of 2-3   Plan:    1. New dose: Discontinue Coumadin 3 mg daily, give Coumadin 5 mg PO X 1 today then Coumadin 3.5 mg PO daily   2. Next INR: 09/02/15

## 2015-09-02 ENCOUNTER — Ambulatory Visit (INDEPENDENT_AMBULATORY_CARE_PROVIDER_SITE_OTHER): Payer: Medicare Other | Admitting: Pharmacist

## 2015-09-02 DIAGNOSIS — Z7901 Long term (current) use of anticoagulants: Secondary | ICD-10-CM

## 2015-09-02 DIAGNOSIS — I480 Paroxysmal atrial fibrillation: Secondary | ICD-10-CM

## 2015-09-02 DIAGNOSIS — I4892 Unspecified atrial flutter: Secondary | ICD-10-CM

## 2015-09-04 ENCOUNTER — Encounter: Payer: Self-pay | Admitting: Cardiology

## 2015-09-04 ENCOUNTER — Ambulatory Visit (INDEPENDENT_AMBULATORY_CARE_PROVIDER_SITE_OTHER): Payer: Medicare Other | Admitting: *Deleted

## 2015-09-04 DIAGNOSIS — Z950 Presence of cardiac pacemaker: Secondary | ICD-10-CM | POA: Diagnosis not present

## 2015-09-04 NOTE — Progress Notes (Signed)
Wound check appointment. Steri-strips removed. Wound without redness or edema. Incision edges approximated, wound well healed. Normal device function. Thresholds, sensing, and impedances consistent with implant measurements. Device programmed at 3.5V for extra safety margin until 3 month visit. Histogram distribution appropriate for patient and level of activity. No mode switches or high ventricular rates noted. Patient educated about wound care, arm mobility, lifting restrictions. ROV 11/17/2015 with WC.

## 2015-09-08 ENCOUNTER — Encounter: Payer: Self-pay | Admitting: Adult Health

## 2015-09-08 ENCOUNTER — Non-Acute Institutional Stay (SKILLED_NURSING_FACILITY): Payer: Medicare Other | Admitting: Adult Health

## 2015-09-08 DIAGNOSIS — Z7901 Long term (current) use of anticoagulants: Secondary | ICD-10-CM

## 2015-09-08 DIAGNOSIS — I480 Paroxysmal atrial fibrillation: Secondary | ICD-10-CM

## 2015-09-08 NOTE — Progress Notes (Signed)
Patient ID: Natasha Fletcher, female   DOB: 05-21-1925, 82 y.o.   MRN: [id number] Subjective:     Indication: atrial fibrillation Bleeding signs/symptoms: None Thromboembolic signs/symptoms: None  Missed Coumadin doses: None Medication changes: no Dietary changes: no Bacterial/viral infection: no Other concerns: no  The following portions of the patient's history were reviewed and updated as appropriate: allergies, current medications, past family history, past medical history, past social history, past surgical history and problem list.  Review of Systems A comprehensive review of systems was negative.   Objective:    INR Today: 1.6 Current dose: Coumadin 3.5 mg daily     Assessment:    Subtherapeutic INR for goal of 2-3   Plan:    1. New dose: Increase Coumadin 4 mg daily   2. Next INR: 09/12/15

## 2015-09-10 ENCOUNTER — Encounter: Payer: Self-pay | Admitting: Adult Health

## 2015-09-10 NOTE — Progress Notes (Signed)
Patient ID: Natasha Fletcher, female   DOB: 1925-08-06, 82 y.o.   MRN: [id number]   This encounter was created in error - please disregard.  This encounter was created in error - please disregard.

## 2015-09-12 DIAGNOSIS — Z483 Aftercare following surgery for neoplasm: Secondary | ICD-10-CM | POA: Diagnosis not present

## 2015-09-12 DIAGNOSIS — M5126 Other intervertebral disc displacement, lumbar region: Secondary | ICD-10-CM | POA: Diagnosis not present

## 2015-09-12 DIAGNOSIS — R1312 Dysphagia, oropharyngeal phase: Secondary | ICD-10-CM | POA: Diagnosis not present

## 2015-09-12 DIAGNOSIS — M4806 Spinal stenosis, lumbar region: Secondary | ICD-10-CM | POA: Diagnosis not present

## 2015-09-16 ENCOUNTER — Ambulatory Visit: Payer: Medicare Other | Admitting: Cardiology

## 2015-09-17 ENCOUNTER — Telehealth: Payer: Self-pay | Admitting: Pharmacist

## 2015-09-17 ENCOUNTER — Ambulatory Visit (INDEPENDENT_AMBULATORY_CARE_PROVIDER_SITE_OTHER): Payer: Medicare Other | Admitting: Pharmacist

## 2015-09-17 DIAGNOSIS — I5032 Chronic diastolic (congestive) heart failure: Secondary | ICD-10-CM | POA: Diagnosis not present

## 2015-09-17 DIAGNOSIS — N390 Urinary tract infection, site not specified: Secondary | ICD-10-CM | POA: Diagnosis not present

## 2015-09-17 DIAGNOSIS — Z48812 Encounter for surgical aftercare following surgery on the circulatory system: Secondary | ICD-10-CM | POA: Diagnosis not present

## 2015-09-17 DIAGNOSIS — I480 Paroxysmal atrial fibrillation: Secondary | ICD-10-CM

## 2015-09-17 DIAGNOSIS — Z7901 Long term (current) use of anticoagulants: Secondary | ICD-10-CM | POA: Insufficient documentation

## 2015-09-17 DIAGNOSIS — Z950 Presence of cardiac pacemaker: Secondary | ICD-10-CM | POA: Diagnosis not present

## 2015-09-17 DIAGNOSIS — I4892 Unspecified atrial flutter: Secondary | ICD-10-CM

## 2015-09-17 LAB — POCT INR: INR: 2.6

## 2015-09-17 NOTE — Telephone Encounter (Signed)
Natasha Fletcher from [facility] called as pt was discharged from [facility] last week with INR 1.9 on 6/9. She reports that INR was ordered to be checked tomorrow but is wanting to check today since in home today. Gave order to check INR today and call our office with results.

## 2015-09-23 ENCOUNTER — Ambulatory Visit (INDEPENDENT_AMBULATORY_CARE_PROVIDER_SITE_OTHER): Payer: Medicare Other | Admitting: Pharmacist

## 2015-09-23 DIAGNOSIS — Z7901 Long term (current) use of anticoagulants: Secondary | ICD-10-CM

## 2015-09-23 DIAGNOSIS — I480 Paroxysmal atrial fibrillation: Secondary | ICD-10-CM

## 2015-09-23 DIAGNOSIS — I4892 Unspecified atrial flutter: Secondary | ICD-10-CM

## 2015-09-23 LAB — POCT INR: INR: 4

## 2015-09-26 ENCOUNTER — Ambulatory Visit (INDEPENDENT_AMBULATORY_CARE_PROVIDER_SITE_OTHER): Payer: Medicare Other | Admitting: Pharmacist Clinician (PhC)/ Clinical Pharmacy Specialist

## 2015-09-26 DIAGNOSIS — Z7901 Long term (current) use of anticoagulants: Secondary | ICD-10-CM

## 2015-09-26 DIAGNOSIS — I4892 Unspecified atrial flutter: Secondary | ICD-10-CM

## 2015-09-26 DIAGNOSIS — I480 Paroxysmal atrial fibrillation: Secondary | ICD-10-CM

## 2015-09-26 LAB — POCT INR: INR: 2.2

## 2015-09-30 ENCOUNTER — Ambulatory Visit (INDEPENDENT_AMBULATORY_CARE_PROVIDER_SITE_OTHER): Payer: Medicare Other | Admitting: Pharmacist

## 2015-09-30 DIAGNOSIS — I480 Paroxysmal atrial fibrillation: Secondary | ICD-10-CM

## 2015-09-30 DIAGNOSIS — Z7901 Long term (current) use of anticoagulants: Secondary | ICD-10-CM

## 2015-09-30 DIAGNOSIS — I4892 Unspecified atrial flutter: Secondary | ICD-10-CM

## 2015-09-30 LAB — POCT INR: INR: 1.9

## 2015-10-03 ENCOUNTER — Encounter: Payer: Self-pay | Admitting: Cardiology

## 2015-10-03 LAB — PROTIME-INR: INR: 3.7 — AB (ref 0.9–1.1)

## 2015-10-06 ENCOUNTER — Telehealth: Payer: Self-pay | Admitting: Pharmacist

## 2015-10-06 ENCOUNTER — Ambulatory Visit (INDEPENDENT_AMBULATORY_CARE_PROVIDER_SITE_OTHER): Payer: Medicare Other | Admitting: Pharmacist Clinician (PhC)/ Clinical Pharmacy Specialist

## 2015-10-06 DIAGNOSIS — Z7901 Long term (current) use of anticoagulants: Secondary | ICD-10-CM

## 2015-10-06 DIAGNOSIS — I4892 Unspecified atrial flutter: Secondary | ICD-10-CM

## 2015-10-06 DIAGNOSIS — I480 Paroxysmal atrial fibrillation: Secondary | ICD-10-CM

## 2015-10-06 NOTE — Telephone Encounter (Signed)
Spoke with Natasha Fletcher who is Natasha Fletcher home health nurse. She states that on Friday she was unable to get her coag machine to read pt INR. Last time this occurred it was because her INR was too high. She did a venipuncture and the results have not come back yet. She also reports that the patient had both 4mg  tablets and 5 mg tablets out. She states that she put an X on the 5 mg tablet bottle and put it away. She also noted that her pain medication had run out and according to the prescriber she should have several more weeks of this medication.   Natasha Fletcher said she is going to continue to work with her to get her medications straight. She has labeled her cardiac medications with a heart on each bottle.   Will watch for INR to result today and call both Natasha Fletcher, Natasha Fletcher and her emergency contact her brother.

## 2015-10-07 LAB — PROTIME-INR

## 2015-10-08 ENCOUNTER — Encounter: Payer: Self-pay | Admitting: Cardiology

## 2015-10-22 ENCOUNTER — Telehealth: Payer: Self-pay | Admitting: Pharmacist

## 2015-10-22 NOTE — Telephone Encounter (Signed)
Spoke with Dow Adolph nurse as we have not seen INR and she is over due by a week.   She reports that she last drew an INR on 10/14/15 which was 1.3. She believes that someone at Dr. Curt Bears office dosed her that day. I do not see documented where anyone has addressed her INR from that day.  I have asked that she redraw INR on Friday (her next home visit) and call results to our office (gave her our direct line).   She also reports that the patient seems much more clear mentally since finishing antibiotics for UTI.

## 2015-10-28 ENCOUNTER — Encounter: Payer: Self-pay | Admitting: Cardiology

## 2015-10-29 ENCOUNTER — Other Ambulatory Visit: Payer: Self-pay | Admitting: *Deleted

## 2015-10-29 ENCOUNTER — Ambulatory Visit (INDEPENDENT_AMBULATORY_CARE_PROVIDER_SITE_OTHER): Payer: Medicare Other | Admitting: Pharmacist Clinician (PhC)/ Clinical Pharmacy Specialist

## 2015-10-29 DIAGNOSIS — I480 Paroxysmal atrial fibrillation: Secondary | ICD-10-CM

## 2015-10-29 DIAGNOSIS — Z7901 Long term (current) use of anticoagulants: Secondary | ICD-10-CM

## 2015-10-29 DIAGNOSIS — I4892 Unspecified atrial flutter: Secondary | ICD-10-CM

## 2015-10-29 LAB — PROTIME-INR: INR: 5.2 — AB (ref <=1.1)

## 2015-10-29 MED ORDER — AMIODARONE HCL 200 MG PO TABS: 200.0000 mg | ORAL_TABLET | Freq: Every day | ORAL | 11 refills | Status: DC

## 2015-10-29 NOTE — Telephone Encounter (Signed)
[facility] calling to confirm if patient should be taking Amiodarone.  Informed RN that patient should be taking this medication. She requested new rx to be sent to pharmacy, explaining pt was out of this med. rx sent to [facility]

## 2015-11-01 IMAGING — US US RENAL
1 series · 14 of 25 positions shown · non-contrast
Comparison: None.

CLINICAL DATA: Acute renal failure

EXAM:
RENAL/URINARY TRACT ULTRASOUND COMPLETE

[Series 1: us renal · 0.17mm/px · 14 of 44 slices shown]
[im 1/44]
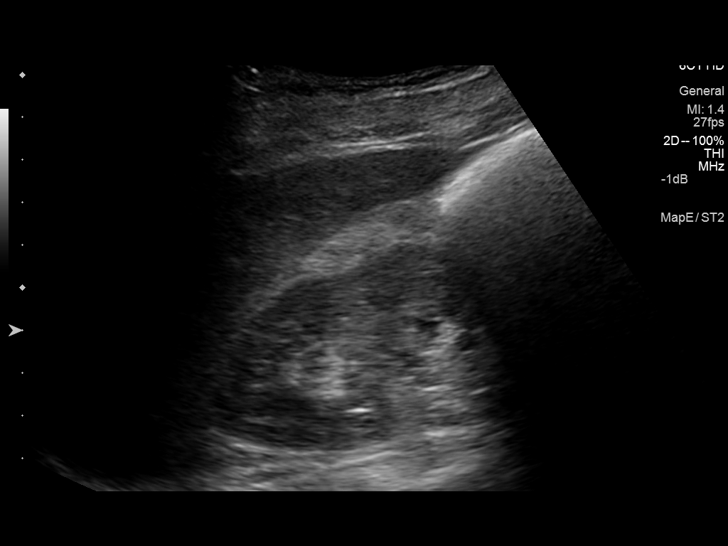
[im 4/44]
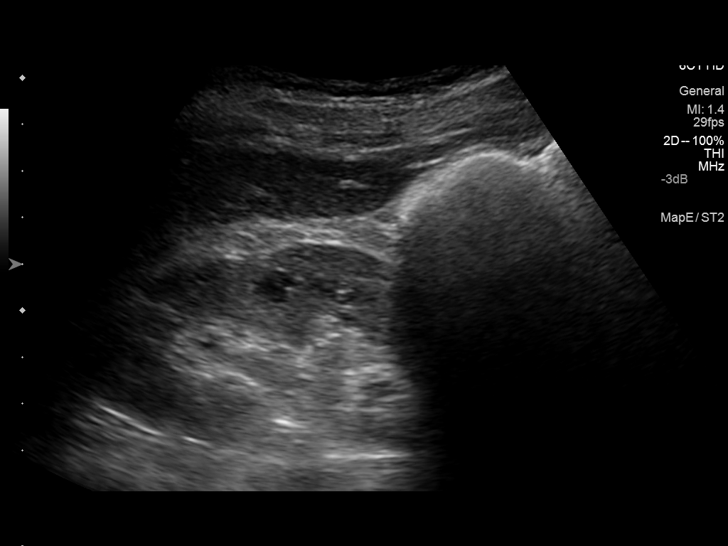
[im 8/44]
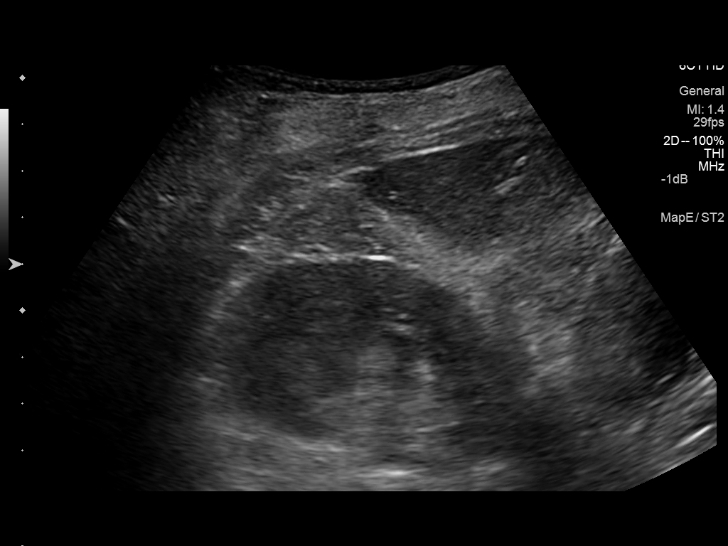
[im 11/44]
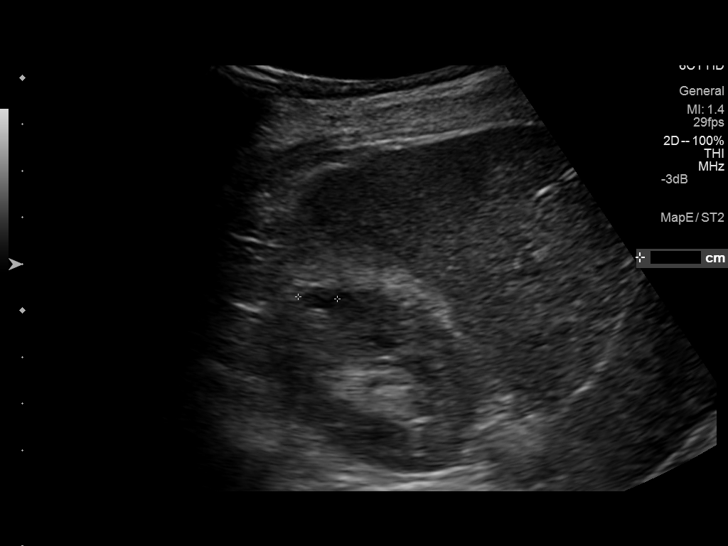
[im 15/44]
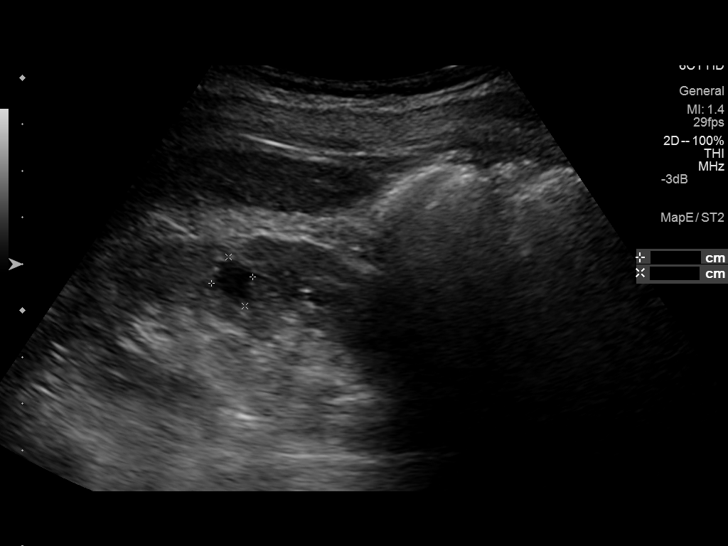
[im 17/44]
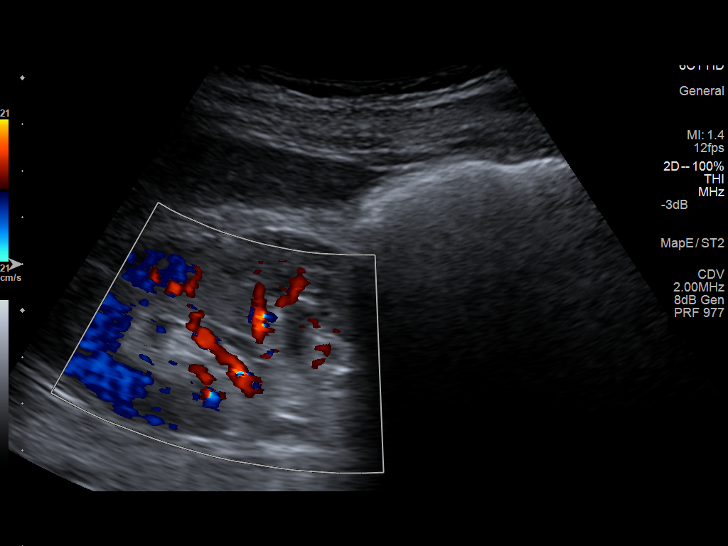
[im 20/44]
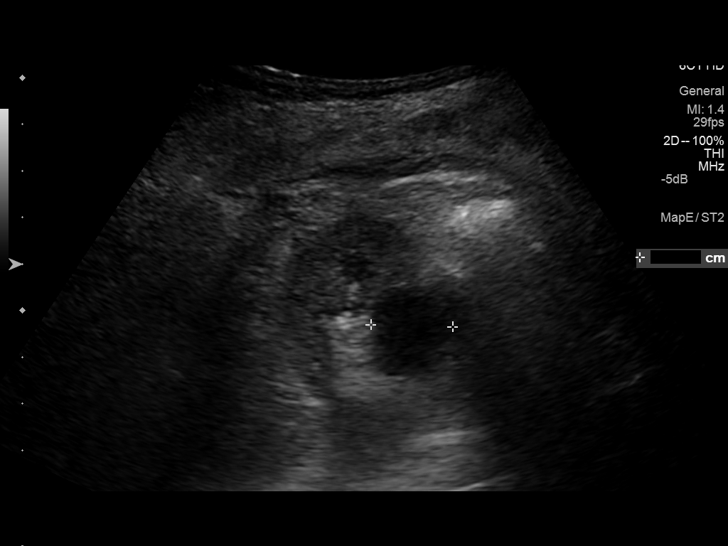
[im 24/44]
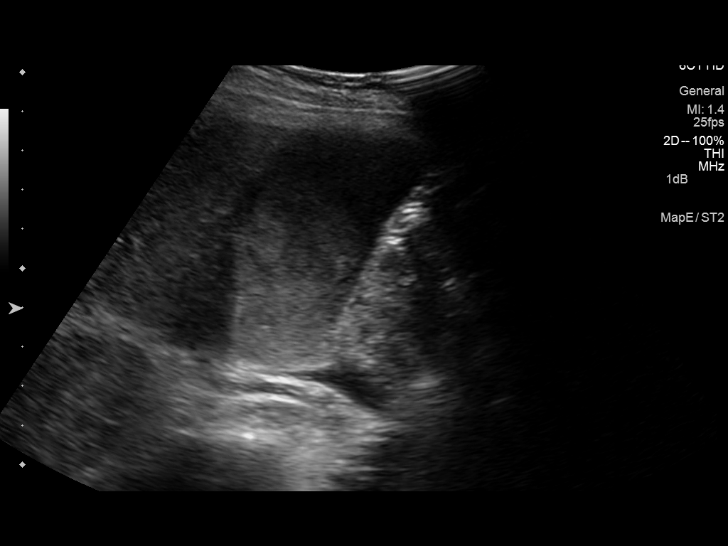
[im 27/44]
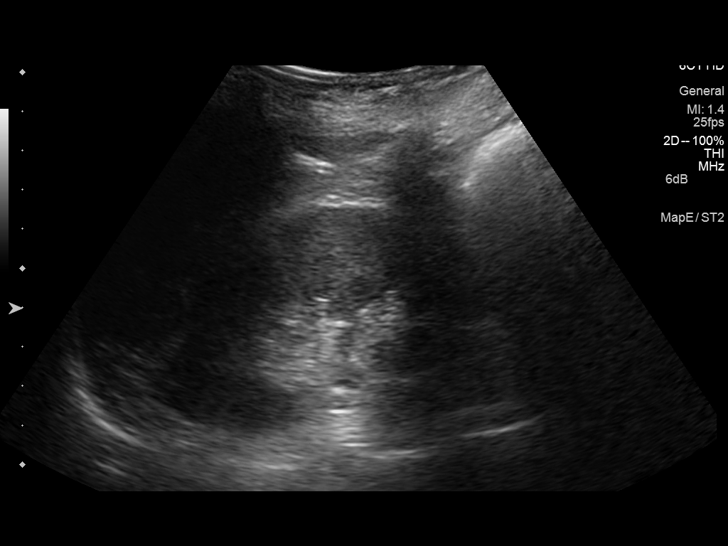
[im 29/44]
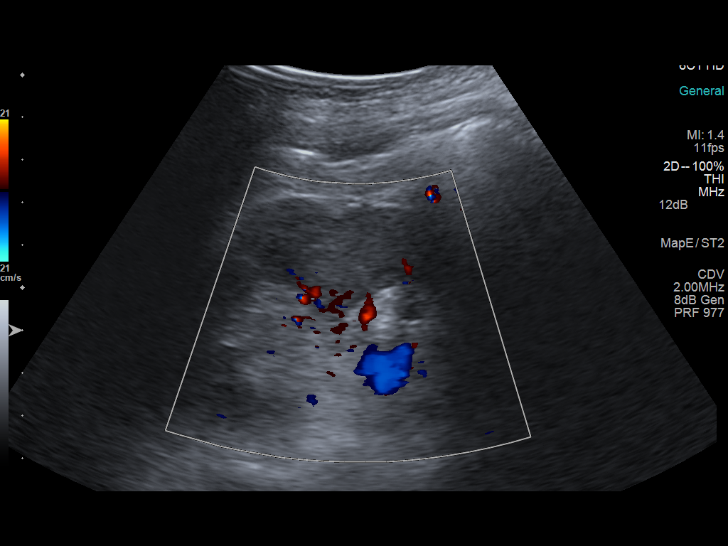
[im 33/44]
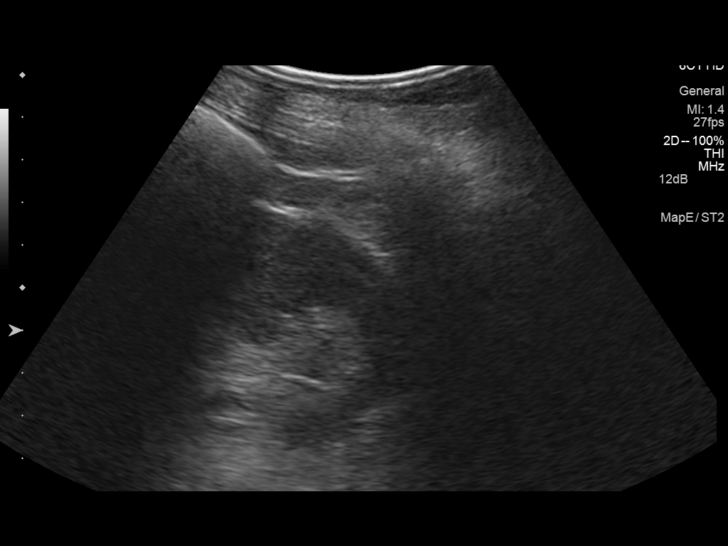
[im 36/44]
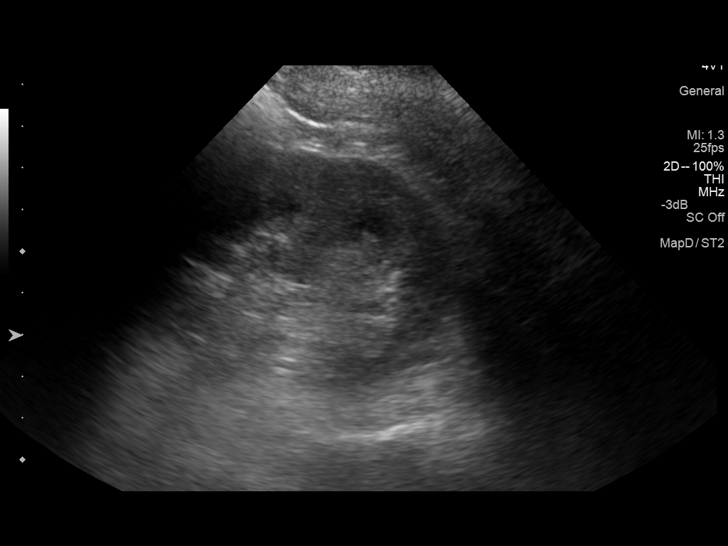
[im 40/44]
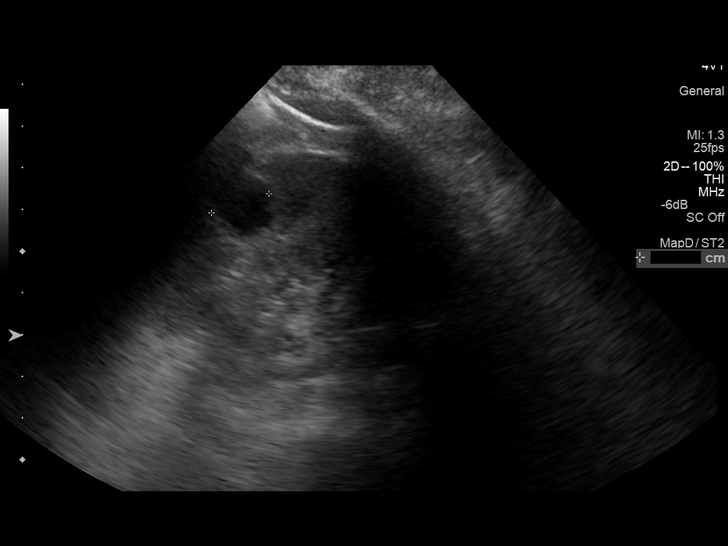
[im 44/44]
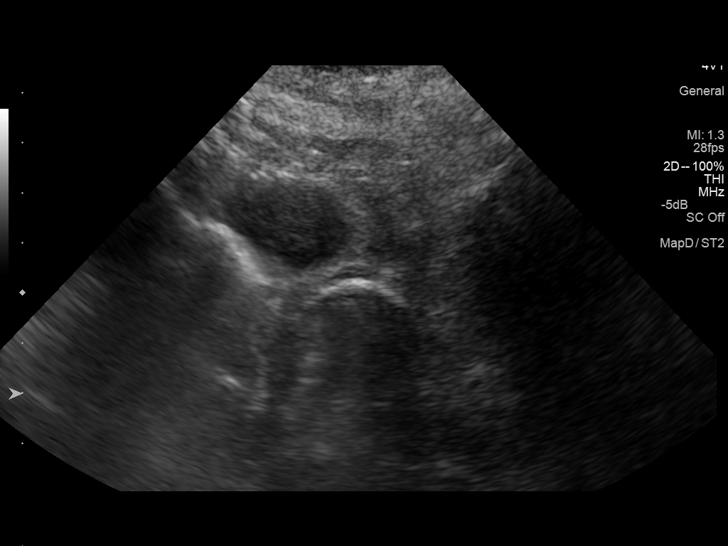

[14 of 25 positions shown; findings below may reference images not displayed]

FINDINGS: Right Kidney:

Length: 10.0 cm. 2 cm lower pole cyst. 1 cm midpole cyst.
Echogenicity within normal limits. No mass or hydronephrosis
visualized.

Left Kidney:

Length: 9.8 cm. Lower pole cyst measuring 15 mm.. Echogenicity
within normal limits. No mass or hydronephrosis visualized.

Bladder:

Foley catheter in the bladder.  Urinary bladder is empty.
IMPRESSION: Negative for renal obstruction.  Normal renal size.

## 2015-11-01 IMAGING — CR DG CHEST 1V PORT
1 series · 1 of 1 positions shown · non-contrast
Comparison: February 15, 2013

CLINICAL DATA: Asthma and chest pain ; breast carcinoma

EXAM:
PORTABLE CHEST - 1 VIEW

[AP]
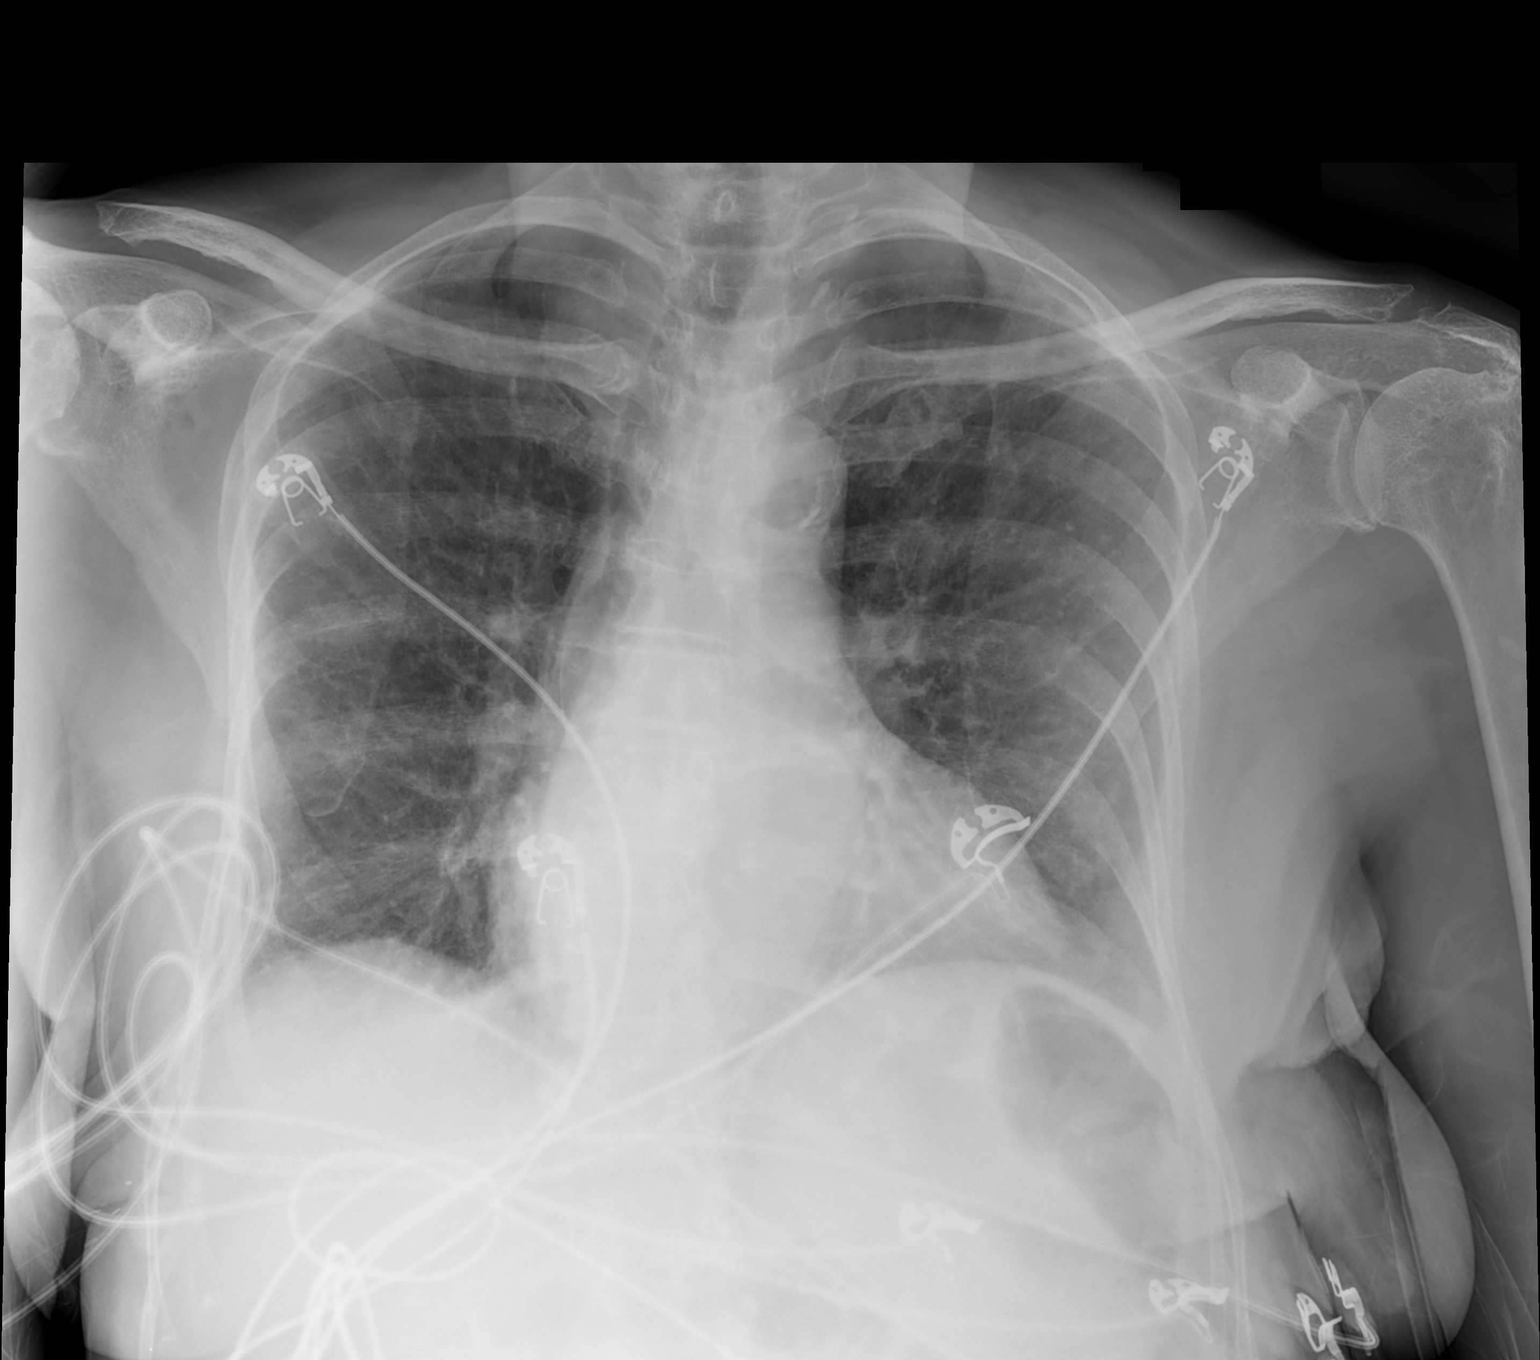

[1 of 1 positions shown; findings below may reference images not displayed]

FINDINGS: There is no edema or consolidation. Patchy interstitial fibrosis
remains without change. Heart is upper normal in size with pulmonary
vascularity within normal limits. No adenopathy. There is
atherosclerotic change in aorta. Bones are somewhat osteoporotic.
There is evidence of prior trauma in the lateral right clavicle with
widening of the acromioclavicular joint on the right.
IMPRESSION: Underlying patchy interstitial fibrosis. No edema or consolidation.
Widening of the acromioclavicular joint on the right, more apparent
than on prior study.

## 2015-11-02 ENCOUNTER — Other Ambulatory Visit: Payer: Self-pay | Admitting: Cardiovascular Disease

## 2015-11-05 ENCOUNTER — Other Ambulatory Visit: Payer: Self-pay | Admitting: Internal Medicine

## 2015-11-05 DIAGNOSIS — Z853 Personal history of malignant neoplasm of breast: Secondary | ICD-10-CM

## 2015-11-07 ENCOUNTER — Ambulatory Visit (INDEPENDENT_AMBULATORY_CARE_PROVIDER_SITE_OTHER): Payer: Medicare Other | Admitting: Pharmacist

## 2015-11-07 DIAGNOSIS — I4892 Unspecified atrial flutter: Secondary | ICD-10-CM

## 2015-11-07 DIAGNOSIS — I480 Paroxysmal atrial fibrillation: Secondary | ICD-10-CM

## 2015-11-07 DIAGNOSIS — Z7901 Long term (current) use of anticoagulants: Secondary | ICD-10-CM

## 2015-11-07 LAB — POCT INR: INR: 1.2

## 2015-11-12 ENCOUNTER — Other Ambulatory Visit: Payer: Self-pay | Admitting: Adult Health

## 2015-11-12 ENCOUNTER — Ambulatory Visit (INDEPENDENT_AMBULATORY_CARE_PROVIDER_SITE_OTHER): Payer: Medicare Other | Admitting: Pharmacist Clinician (PhC)/ Clinical Pharmacy Specialist

## 2015-11-12 DIAGNOSIS — I4892 Unspecified atrial flutter: Secondary | ICD-10-CM

## 2015-11-12 DIAGNOSIS — Z7901 Long term (current) use of anticoagulants: Secondary | ICD-10-CM

## 2015-11-12 DIAGNOSIS — I480 Paroxysmal atrial fibrillation: Secondary | ICD-10-CM

## 2015-11-12 LAB — PROTIME-INR: INR: 1.2 — AB (ref <=1.1)

## 2015-11-16 NOTE — Progress Notes (Signed)
Electrophysiology Office Note   Date:  11/17/2015   ID:  Natasha Fletcher, DOB Aug 13, 1925, MRN [id number]  PCP:  Myriam Jacobson, MD  Cardiologist:  Claiborne Billings Primary Electrophysiologist:  Castella Lerner Meredith Leeds, MD    Chief Complaint  Patient presents with  . Pacemaker Check    3 months post implant     History of Present Illness: Natasha Fletcher is a 82 y.o. female who presents today for electrophysiology evaluation.   She was diagnosed with atrial fibrillation with RVR and 2015 when she was hospitalized. She was found to have an EF of 65-70% with grade 2 diastolic dysfunction and an intracavitary gradient of 16 mmHg. She was anticoagulated and was cardioverted in October 2015. In October 2016, she was found to be in atrial flutter with 4-1 conduction. She had an echocardiogram at that time showed an EF 55-60% and severe LVH. She had moderate biatrial dilation. She was admitted to [facility] December 2016 she was found to be in atrial fibrillation. She was referred for cardioversion on 06/04/15, and is subsequently presented back to her cardiologist clinic in atrial flutter with 4-1 conduction.  Today, she denies symptoms of palpitations, chest pain,  orthopnea, PND,  claudication, dizziness, presyncope, syncope, bleeding, or neurologic sequela. The patient is tolerating medications without difficulties.  She does complain of some mild fatigue that she says is improved when she is in normal rhythm.  She also complains of significant shortness of breath. She is unable to walk to the mailbox, and gets short of breath when doing house chores. She had an echocardiogram in May that showed a normal ejection fraction, but did not evaluate diastolic function.  Past Medical History:  Diagnosis Date  . Arthritis    "all over"  . Asthma   . Atrial fibrillation ([place])   . Cancer ([place])    rt breast  . CHF (congestive heart failure) ([place])   . Diabetes mellitus without complication ([place])   .  Edema leg   . Fast breathing    "because of pill"  . GERD (gastroesophageal reflux disease)   . Glaucoma   . Glaucoma   . Hypertension   . Incontinence   . Pneumonia    h/o younger  . S/P total knee arthroplasty   . Swelling    bilateral feet/ legs, more left.  . Thyroid disease    "something years ago"  . UTI (urinary tract infection) 02/12/2015  . Wears dentures   . Wears glasses    Past Surgical History:  Procedure Laterality Date  . ABDOMINAL HYSTERECTOMY    . APPENDECTOMY    . BREAST BIOPSY  04/27/2011   Procedure: BREAST BIOPSY WITH NEEDLE LOCALIZATION;  Surgeon: Edward Jolly, MD;  Location: [place];  Service: General;  Laterality: Right;  right needle localized breast lumpectomy   . BREAST LUMPECTOMY  04/27/11   right breast by [place]  . CARDIOVERSION N/A 02/01/2014   Procedure: CARDIOVERSION;  Surgeon: Candee Furbish, MD;  Location: [facility] ENDOSCOPY;  Service: Cardiovascular;  Laterality: N/A;  . CARDIOVERSION N/A 06/04/2015   Procedure: CARDIOVERSION;  Surgeon: Dorothy Spark, MD;  Location: [place];  Service: Cardiovascular;  Laterality: N/A;  . CARDIOVERSION N/A 07/14/2015   Procedure: CARDIOVERSION;  Surgeon: Thayer Headings, MD;  Location: [facility] ENDOSCOPY;  Service: Cardiovascular;  Laterality: N/A;  . CARPAL TUNNEL RELEASE Bilateral   . COLONOSCOPY    . EP IMPLANTABLE DEVICE N/A 08/22/2015   Procedure: Pacemaker Implant;  Surgeon: Brexley Cutshaw Meredith Leeds, MD;  Location: [place] CV LAB;  Service: Cardiovascular;  Laterality: N/A;  . EYE SURGERY     both cataracts  . SHOULDER SURGERY Left   . TEE WITHOUT CARDIOVERSION N/A 02/01/2014   Procedure: TRANSESOPHAGEAL ECHOCARDIOGRAM (TEE);  Surgeon: Candee Furbish, MD;  Location: [facility] ENDOSCOPY;  Service: Cardiovascular;  Laterality: N/A;  . TONSILLECTOMY    . TOTAL HIP ARTHROPLASTY Bilateral   . TOTAL KNEE ARTHROPLASTY Right 02/19/2013   Procedure: RIGHT TOTAL KNEE ARTHROPLASTY;  Surgeon: Gearlean Alf, MD;  Location: [facility] ORS;  Service: Orthopedics;  Laterality: Right;  . TOTAL KNEE ARTHROPLASTY Left 10/15/2013   Procedure: LEFT TOTAL KNEE ARTHROPLASTY;  Surgeon: Gearlean Alf, MD;  Location: [facility] ORS;  Service: Orthopedics;  Laterality: Left;     Current Outpatient Prescriptions  Medication Sig Dispense Refill  . acetaminophen (TYLENOL) 325 MG tablet Take 650 mg by mouth at bedtime.    Marland Kitchen amiodarone (PACERONE) 200 MG tablet Take 1 tablet (200 mg total) by mouth daily. 30 tablet 11  . anastrozole (ARIMIDEX) 1 MG tablet TAKE 1 TABLET BY MOUTH EVERY DAY 30 tablet 2  . furosemide (LASIX) 20 MG tablet Take 20 mg by mouth daily.    Marland Kitchen HYDROcodone-acetaminophen (NORCO) 10-325 MG tablet Take 1 tablet by mouth 2 (two) times daily as needed.  0  . latanoprost (XALATAN) 0.005 % ophthalmic solution Place 1 drop into both eyes at bedtime.    . meclizine (ANTIVERT) 25 MG tablet Take 12.5 mg by mouth 3 (three) times daily. Reported on 06/03/2015    . metoprolol (LOPRESSOR) 100 MG tablet Take 1 tablet (100 mg total) by mouth 2 (two) times daily. Needs appointment. 60 tablet 2  . Protein (PROCEL 100 PO) Take 1 scoop by mouth 2 (two) times daily.    . sennosides-docusate sodium (SENOKOT-S) 8.6-50 MG tablet Take 1 tablet by mouth 2 (two) times daily.    . traZODone (DESYREL) 50 MG tablet Take 50 mg by mouth at bedtime.  0  . UNABLE TO FIND Med Name: MedPass 120 mL po TID between meals for supplement and nutritional support    . Warfarin Sodium (COUMADIN PO) Take 4 mg by mouth daily.      No current facility-administered medications for this visit.     Allergies:   Lactose intolerance (gi); Milk-related compounds; and Sulfa antibiotics   Social History:  The patient  reports that she quit smoking about 40 years ago. Her smoking use included Cigarettes. She smoked 0.00 packs per day for 30.00 years. She has never used smokeless tobacco. She reports that she drinks alcohol. She reports that she does not  use drugs.   Family History:  The patient's family history includes Heart disease in her mother; Hypertension in her father and mother; Stroke in her brother, father, and mother.    ROS:  Please see the history of present illness.   Otherwise, review of systems is positive for leg swelling, shortness of breath.   All other systems are reviewed and negative.    PHYSICAL EXAM: VS:  BP (!) 188/62   Pulse 71   Ht 5\' 4"  (1.626 m)   Wt 140 lb 6.4 oz (63.7 kg)   BMI 24.10 kg/m  , BMI Body mass index is 24.1 kg/m. GEN: Well nourished, well developed, in no acute distress  HEENT: normal  Neck: no JVD, carotid bruits, or masses Cardiac: RRR; no murmurs, rubs, or gallops,no edema  Respiratory:  clear to  auscultation bilaterally, normal work of breathing GI: soft, nontender, nondistended, + BS MS: no deformity or atrophy  Skin: warm and dry Neuro:  Strength and sensation are intact Psych: euthymic mood, full affect  EKG:  EKG is ordered today. Personal review of the ekg ordered today shows atrial flutter, rate 71, LAFB, prolonged QTc 517   Recent Labs: 08/22/2015: B Natriuretic Peptide 1,225.5; Magnesium 2.1; TSH 1.803 08/27/2015: ALT 12; BUN 43; Creatinine 1.4; Hemoglobin 11.1; Platelets 197; Potassium 4.1; Sodium 139    Lipid Panel  No results found for: CHOL, TRIG, HDL, CHOLHDL, VLDL, LDLCALC, LDLDIRECT   Wt Readings from Last 3 Encounters:  11/17/15 140 lb 6.4 oz (63.7 kg)  09/10/15 139 lb 12.8 oz (63.4 kg)  09/08/15 139 lb 12.8 oz (63.4 kg)      Other studies Reviewed: Additional studies/ records that were reviewed today include: TTE 01/17/15  Review of the above records today demonstrates:  - Left ventricle: The cavity size was mildly reduced. Wall  thickness was increased in a pattern of severe LVH. Systolic  function was normal. The estimated ejection fraction was in the  range of 55% to 60%. - Left atrium: The atrium was moderately dilated. - Right atrium: The  atrium was mildly dilated. - Pericardium, extracardiac: A trivial pericardial effusion was  identified.   ASSESSMENT AND PLAN:  1.  Persistent Atrial fibrillation/flutter: CHADS2VASc of 5.  On coumadin.  Unfortunately if atrial flutter ablation was performed, she would likely go back in atrial fibrillation. She would be at very high risk for complications during atrial fibrillation ablation. Was started on amiodarone at the last visit and had cardioversion performed. She does remain in sinus rhythm since her cardioversion.   2. Sick sinus syndrome: pacemaker placed 08/22/15.  3. Shortness of breath: She has significant shortness of breath associated with exertion. Recent echocardiogram did not show any evidence of heart failure.   4. Hypertension: We'll switch her metoprolol to carvedilol. We'll also increase her Lasix to 40 mg, as she does say that she has lower extremity edema. We Coda Filler have her follow-up in the hypertension clinic.  Current medicines are reviewed at length with the patient today.   The patient does not have concerns regarding her medicines.  The following changes were made today:  amiodarone  Labs/ tests ordered today include:  Orders Placed This Encounter  Procedures  . EKG 12-Lead   Disposition:   FU with Zyniah Ferraiolo 3 months  Signed, Makyiah Lie Meredith Leeds, MD  11/17/2015 10:31 AM     [facility] [address] [place] [address] [phone] (office) [phone] (fax)

## 2015-11-17 ENCOUNTER — Encounter (INDEPENDENT_AMBULATORY_CARE_PROVIDER_SITE_OTHER): Payer: Self-pay

## 2015-11-17 ENCOUNTER — Ambulatory Visit (INDEPENDENT_AMBULATORY_CARE_PROVIDER_SITE_OTHER): Payer: Medicare Other | Admitting: Pharmacist

## 2015-11-17 ENCOUNTER — Ambulatory Visit (INDEPENDENT_AMBULATORY_CARE_PROVIDER_SITE_OTHER): Payer: Medicare Other | Admitting: Cardiology

## 2015-11-17 ENCOUNTER — Encounter: Payer: Self-pay | Admitting: Cardiology

## 2015-11-17 VITALS — BP 188/62 | HR 71 | Ht 64.0 in | Wt 140.4 lb

## 2015-11-17 DIAGNOSIS — I4892 Unspecified atrial flutter: Secondary | ICD-10-CM

## 2015-11-17 DIAGNOSIS — I481 Persistent atrial fibrillation: Secondary | ICD-10-CM

## 2015-11-17 DIAGNOSIS — I480 Paroxysmal atrial fibrillation: Secondary | ICD-10-CM | POA: Diagnosis not present

## 2015-11-17 DIAGNOSIS — I4819 Other persistent atrial fibrillation: Secondary | ICD-10-CM

## 2015-11-17 DIAGNOSIS — Z7901 Long term (current) use of anticoagulants: Secondary | ICD-10-CM

## 2015-11-17 DIAGNOSIS — R0602 Shortness of breath: Secondary | ICD-10-CM

## 2015-11-17 LAB — CUP PACEART INCLINIC DEVICE CHECK
Battery Remaining Longevity: 130 mo
Battery Voltage: 3.06 V
Brady Statistic RA Percent Paced: 72.39 %
Implantable Lead Implant Date: 20170519
Implantable Lead Location: 753859
Implantable Lead Model: 5076
Lead Channel Impedance Value: 456 Ohm
Lead Channel Pacing Threshold Amplitude: 0.5 V
Lead Channel Pacing Threshold Pulse Width: 0.4 ms
Lead Channel Sensing Intrinsic Amplitude: 3.1 mV
Lead Channel Setting Pacing Amplitude: 2.5 V
Lead Channel Setting Sensing Sensitivity: 2.8 mV
MDC IDC LEAD IMPLANT DT: 20170519
MDC IDC LEAD LOCATION: 753860
MDC IDC MSMT LEADCHNL RA IMPEDANCE VALUE: 304 Ohm
MDC IDC MSMT LEADCHNL RA PACING THRESHOLD PULSEWIDTH: 0.4 ms
MDC IDC MSMT LEADCHNL RV IMPEDANCE VALUE: 380 Ohm
MDC IDC MSMT LEADCHNL RV IMPEDANCE VALUE: 475 Ohm
MDC IDC MSMT LEADCHNL RV PACING THRESHOLD AMPLITUDE: 1 V
MDC IDC MSMT LEADCHNL RV SENSING INTR AMPL: 20 mV — AB
MDC IDC SESS DTM: 20170814104715
MDC IDC SET LEADCHNL RA PACING AMPLITUDE: 1.5 V
MDC IDC SET LEADCHNL RV PACING PULSEWIDTH: 0.4 ms
MDC IDC STAT BRADY AP VP PERCENT: 0.06 %
MDC IDC STAT BRADY AP VS PERCENT: 72.34 %
MDC IDC STAT BRADY AS VP PERCENT: 0.01 %
MDC IDC STAT BRADY AS VS PERCENT: 27.6 %
MDC IDC STAT BRADY RV PERCENT PACED: 0.06 %

## 2015-11-17 LAB — POCT INR: INR: 1.1

## 2015-11-17 MED ORDER — CARVEDILOL 12.5 MG PO TABS: 12.5000 mg | ORAL_TABLET | Freq: Two times a day (BID) | ORAL | 3 refills | Status: DC

## 2015-11-17 MED ORDER — FUROSEMIDE 40 MG PO TABS
40.0000 mg | ORAL_TABLET | Freq: Every day | ORAL | 3 refills | Status: DC
Start: 2015-11-17 — End: 2016-03-08

## 2015-11-17 NOTE — Patient Instructions (Signed)
Your physician has recommended you make the following change in your medication:  1.) stop metoprolol 2.) start carvedilol (Coreg) 12.5 mg twice daily 3.) increase furosemide (Lasix) to 40 mg once a day  Your physician recommends that you schedule a follow-up appointment with the hypertension clinic for a blood pressure check in about a week.  Your physician wants you to follow-up in: 6 months with Dr. Curt Bears.  You will receive a reminder letter in the mail two months in advance. If you don't receive a letter, please call our office to schedule the follow-up appointment.  Remote monitoring is used to monitor your Pacemaker of ICD from home. This monitoring reduces the number of office visits required to check your device to one time per year. It allows [place] to keep an eye on the functioning of your device to ensure it is working properly. You are scheduled for a device check from home on 02/16/16. You may send your transmission at any time that day. If you have a wireless device, the transmission will be sent automatically. After your physician reviews your transmission, you will receive a postcard with your next transmission date.

## 2015-11-21 ENCOUNTER — Other Ambulatory Visit: Payer: Self-pay | Admitting: Adult Health

## 2015-11-21 ENCOUNTER — Ambulatory Visit (INDEPENDENT_AMBULATORY_CARE_PROVIDER_SITE_OTHER): Payer: Medicare Other | Admitting: Pharmacist

## 2015-11-21 DIAGNOSIS — I4892 Unspecified atrial flutter: Secondary | ICD-10-CM

## 2015-11-21 DIAGNOSIS — I480 Paroxysmal atrial fibrillation: Secondary | ICD-10-CM

## 2015-11-21 DIAGNOSIS — Z7901 Long term (current) use of anticoagulants: Secondary | ICD-10-CM

## 2015-11-21 LAB — POCT INR: INR: 1.1

## 2015-11-24 ENCOUNTER — Ambulatory Visit
Admission: RE | Admit: 2015-11-24 | Discharge: 2015-11-24 | Disposition: A | Discharge status: Home / Self Care | Payer: Medicare Other | Source: Ambulatory Visit | Attending: Internal Medicine | Admitting: Internal Medicine

## 2015-11-24 DIAGNOSIS — Z853 Personal history of malignant neoplasm of breast: Secondary | ICD-10-CM

## 2015-11-25 ENCOUNTER — Ambulatory Visit: Payer: Medicare Other

## 2015-11-25 NOTE — Progress Notes (Deleted)
Patient ID: Natasha Fletcher                 DOB: Apr 05, 1926                      MRN: [id number]     HPI: Natasha Fletcher is a 82 y.o. female referred by Dr. Curt Bears to HTN clinic. She has HTN, DM, chronic diastolic CHF with EF 123456 (08/2015), and atrial fibrillation and flutter on coumadin and amiodarone. She was recently cardioverted on 07/14/2015, and had pacemaker planted on 08/22/2015.   At the last OV with Dr. Curt Bears on 11/17/15, her BP was 188/62 on furosemide 20mg  daily and metoprolol 100mg  BID. At that time, she was switched from metoprolol to carvedilol and dose of furosemide was increased to 40 mg daily as she said she had LE edema.       Current HTN meds: furosemide 40 mg QD, carvedilol 12.5 mg BID. Previously tried: metoprolol 100 mg BID BP goal: <150/90 mmHg  Family History: heart disease in her mother; hypertension in her father and mother; stroke in her brother, father and mother.   Social History: former smoker with 30 pack-year history. Quit date 04/21/1975. Never used smokeless tobacco. Occasional alcohol use.    Diet:   Exercise:   Home BP readings:   Wt Readings from Last 3 Encounters:  11/17/15 140 lb 6.4 oz (63.7 kg)  09/10/15 139 lb 12.8 oz (63.4 kg)  09/08/15 139 lb 12.8 oz (63.4 kg)   BP Readings from Last 3 Encounters:  11/17/15 (!) 188/62  09/10/15 134/70  09/08/15 134/70   Pulse Readings from Last 3 Encounters:  11/17/15 71  09/10/15 60  09/08/15 61    Renal function: CrCl cannot be calculated (Patient's most recent lab result is older than the maximum 21 days allowed.).  Past Medical History:  Diagnosis Date  . Arthritis    "all over"  . Asthma   . Atrial fibrillation ([place])   . Cancer ([place])    rt breast  . CHF (congestive heart failure) ([place])   . Diabetes mellitus without complication ([place])   . Edema leg   . Fast breathing    "because of pill"  . GERD (gastroesophageal reflux disease)   . Glaucoma   . Glaucoma   . Hypertension     . Incontinence   . Pneumonia    h/o younger  . S/P total knee arthroplasty   . Swelling    bilateral feet/ legs, more left.  . Thyroid disease    "something years ago"  . UTI (urinary tract infection) 02/12/2015  . Wears dentures   . Wears glasses     Current Outpatient Prescriptions on File Prior to Visit  Medication Sig Dispense Refill  . acetaminophen (TYLENOL) 325 MG tablet Take 650 mg by mouth at bedtime.    Marland Kitchen amiodarone (PACERONE) 200 MG tablet Take 1 tablet (200 mg total) by mouth daily. 30 tablet 11  . anastrozole (ARIMIDEX) 1 MG tablet TAKE 1 TABLET BY MOUTH EVERY DAY 30 tablet 2  . carvedilol (COREG) 12.5 MG tablet Take 1 tablet (12.5 mg total) by mouth 2 (two) times daily. 180 tablet 3  . furosemide (LASIX) 40 MG tablet Take 1 tablet (40 mg total) by mouth daily. 90 tablet 3  . HYDROcodone-acetaminophen (NORCO) 10-325 MG tablet Take 1 tablet by mouth 2 (two) times daily as needed.  0  . latanoprost (XALATAN) 0.005 % ophthalmic solution Place 1 drop  into both eyes at bedtime.    . meclizine (ANTIVERT) 25 MG tablet Take 12.5 mg by mouth 3 (three) times daily. Reported on 06/03/2015    . Protein (PROCEL 100 PO) Take 1 scoop by mouth 2 (two) times daily.    . sennosides-docusate sodium (SENOKOT-S) 8.6-50 MG tablet Take 1 tablet by mouth 2 (two) times daily.    . traZODone (DESYREL) 50 MG tablet Take 50 mg by mouth at bedtime.  0  . UNABLE TO FIND Med Name: MedPass 120 mL po TID between meals for supplement and nutritional support    . Warfarin Sodium (COUMADIN PO) Take 4 mg by mouth daily.      No current facility-administered medications on file prior to visit.     Allergies  Allergen Reactions  . Lactose Intolerance (Gi) Other (See Comments)    Stomach pain  . Milk-Related Compounds Other (See Comments)    Upset stomach   . Sulfa Antibiotics Itching     Assessment/Plan:  1. Hypertension with chronic diastolic CHF:

## 2015-11-26 LAB — POCT INR: INR: 1.1

## 2015-11-28 ENCOUNTER — Ambulatory Visit (INDEPENDENT_AMBULATORY_CARE_PROVIDER_SITE_OTHER): Payer: Medicare Other | Admitting: Pharmacist Clinician (PhC)/ Clinical Pharmacy Specialist

## 2015-11-28 DIAGNOSIS — Z7901 Long term (current) use of anticoagulants: Secondary | ICD-10-CM

## 2015-11-28 DIAGNOSIS — I480 Paroxysmal atrial fibrillation: Secondary | ICD-10-CM

## 2015-11-28 DIAGNOSIS — I4892 Unspecified atrial flutter: Secondary | ICD-10-CM

## 2015-11-29 ENCOUNTER — Other Ambulatory Visit: Payer: Self-pay | Admitting: Adult Health

## 2015-12-03 ENCOUNTER — Telehealth: Payer: Self-pay | Admitting: *Deleted
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
## 2015-12-10 ENCOUNTER — Other Ambulatory Visit: Payer: Self-pay | Admitting: Adult Health

## 2015-12-10 ENCOUNTER — Ambulatory Visit (INDEPENDENT_AMBULATORY_CARE_PROVIDER_SITE_OTHER): Payer: Medicare Other | Admitting: Pharmacist

## 2015-12-10 DIAGNOSIS — I480 Paroxysmal atrial fibrillation: Secondary | ICD-10-CM

## 2015-12-10 DIAGNOSIS — I4892 Unspecified atrial flutter: Secondary | ICD-10-CM

## 2015-12-10 DIAGNOSIS — Z7901 Long term (current) use of anticoagulants: Secondary | ICD-10-CM

## 2015-12-10 LAB — POCT INR: INR: 1.2

## 2015-12-10 MED ORDER — WARFARIN SODIUM 4 MG PO TABS: ORAL_TABLET | ORAL | 1 refills | Status: DC

## 2015-12-15 ENCOUNTER — Ambulatory Visit (INDEPENDENT_AMBULATORY_CARE_PROVIDER_SITE_OTHER): Payer: Medicare Other | Admitting: Pharmacist

## 2015-12-15 DIAGNOSIS — I4892 Unspecified atrial flutter: Secondary | ICD-10-CM

## 2015-12-15 DIAGNOSIS — Z7901 Long term (current) use of anticoagulants: Secondary | ICD-10-CM

## 2015-12-15 DIAGNOSIS — I480 Paroxysmal atrial fibrillation: Secondary | ICD-10-CM

## 2015-12-15 LAB — POCT INR: INR: 1.8

## 2015-12-19 ENCOUNTER — Encounter: Payer: Self-pay | Admitting: *Deleted

## 2015-12-22 ENCOUNTER — Ambulatory Visit (INDEPENDENT_AMBULATORY_CARE_PROVIDER_SITE_OTHER): Payer: Medicare Other | Admitting: Pharmacist Clinician (PhC)/ Clinical Pharmacy Specialist

## 2015-12-22 DIAGNOSIS — I4892 Unspecified atrial flutter: Secondary | ICD-10-CM

## 2015-12-22 DIAGNOSIS — Z7901 Long term (current) use of anticoagulants: Secondary | ICD-10-CM

## 2015-12-22 DIAGNOSIS — I480 Paroxysmal atrial fibrillation: Secondary | ICD-10-CM

## 2015-12-22 LAB — POCT INR: INR: 4.9

## 2015-12-26 ENCOUNTER — Ambulatory Visit (INDEPENDENT_AMBULATORY_CARE_PROVIDER_SITE_OTHER): Payer: Medicare Other | Admitting: Pharmacist

## 2015-12-26 DIAGNOSIS — I4892 Unspecified atrial flutter: Secondary | ICD-10-CM

## 2015-12-26 DIAGNOSIS — Z7901 Long term (current) use of anticoagulants: Secondary | ICD-10-CM

## 2015-12-26 DIAGNOSIS — I480 Paroxysmal atrial fibrillation: Secondary | ICD-10-CM

## 2015-12-26 LAB — POCT INR: INR: 4.2

## 2015-12-31 ENCOUNTER — Ambulatory Visit (INDEPENDENT_AMBULATORY_CARE_PROVIDER_SITE_OTHER): Payer: Medicare Other | Admitting: Pharmacist Clinician (PhC)/ Clinical Pharmacy Specialist

## 2015-12-31 DIAGNOSIS — I480 Paroxysmal atrial fibrillation: Secondary | ICD-10-CM

## 2015-12-31 DIAGNOSIS — Z7901 Long term (current) use of anticoagulants: Secondary | ICD-10-CM

## 2015-12-31 DIAGNOSIS — I4892 Unspecified atrial flutter: Secondary | ICD-10-CM

## 2015-12-31 LAB — POCT INR: INR: 1.9

## 2016-01-07 ENCOUNTER — Ambulatory Visit (INDEPENDENT_AMBULATORY_CARE_PROVIDER_SITE_OTHER): Payer: Medicare Other | Admitting: Pharmacist

## 2016-01-07 DIAGNOSIS — I480 Paroxysmal atrial fibrillation: Secondary | ICD-10-CM

## 2016-01-07 DIAGNOSIS — Z7901 Long term (current) use of anticoagulants: Secondary | ICD-10-CM

## 2016-01-07 DIAGNOSIS — I4892 Unspecified atrial flutter: Secondary | ICD-10-CM

## 2016-01-07 LAB — POCT INR: INR: 3.4

## 2016-01-14 LAB — POCT INR: INR: 3.1

## 2016-01-15 ENCOUNTER — Ambulatory Visit (INDEPENDENT_AMBULATORY_CARE_PROVIDER_SITE_OTHER): Payer: Medicare Other | Admitting: Pharmacist

## 2016-01-15 DIAGNOSIS — I4892 Unspecified atrial flutter: Secondary | ICD-10-CM

## 2016-01-15 DIAGNOSIS — Z7901 Long term (current) use of anticoagulants: Secondary | ICD-10-CM

## 2016-01-15 DIAGNOSIS — I480 Paroxysmal atrial fibrillation: Secondary | ICD-10-CM

## 2016-01-29 ENCOUNTER — Other Ambulatory Visit: Payer: Self-pay | Admitting: Cardiology

## 2016-01-29 NOTE — Telephone Encounter (Signed)
amiodarone (PACERONE) 200 MG tablet  Medication  Date: 10/29/2015 Department: [place] Office Ordering/Authorizing: Will Meredith Leeds, MD  Order Providers   Prescribing Provider Encounter Provider  Will Meredith Leeds, MD Stanton Kidney, RN  Medication Detail    Disp Refills Start End   amiodarone (PACERONE) 200 MG tablet 30 tablet 11 10/29/2015    Sig - Route: Take 1 tablet (200 mg total) by mouth daily. - Oral   E-Prescribing Status: Receipt confirmed by pharmacy (10/29/2015 2:24 PM EDT)   Pharmacy   [id number] - [place], [place]

## 2016-01-30 ENCOUNTER — Ambulatory Visit (INDEPENDENT_AMBULATORY_CARE_PROVIDER_SITE_OTHER): Payer: Medicare Other | Admitting: Pharmacist Clinician (PhC)/ Clinical Pharmacy Specialist

## 2016-01-30 ENCOUNTER — Other Ambulatory Visit: Payer: Self-pay | Admitting: Cardiology

## 2016-01-30 DIAGNOSIS — I480 Paroxysmal atrial fibrillation: Secondary | ICD-10-CM | POA: Diagnosis not present

## 2016-01-30 DIAGNOSIS — I4892 Unspecified atrial flutter: Secondary | ICD-10-CM | POA: Diagnosis not present

## 2016-01-30 DIAGNOSIS — Z7901 Long term (current) use of anticoagulants: Secondary | ICD-10-CM

## 2016-01-30 LAB — POCT INR: INR: 3.7

## 2016-01-30 MED ORDER — WARFARIN SODIUM 4 MG PO TABS: ORAL_TABLET | ORAL | 2 refills | Status: DC

## 2016-01-30 NOTE — Telephone Encounter (Signed)
amiodarone (PACERONE) 200 MG tablet  Medication  Date: 10/29/2015 Department: [place] Office Ordering/Authorizing: Will Meredith Leeds, MD  Order Providers   Prescribing Provider Encounter Provider  Will Meredith Leeds, MD Stanton Kidney, RN  Medication Detail    Disp Refills Start End   amiodarone (PACERONE) 200 MG tablet 30 tablet 11 10/29/2015    Sig - Route: Take 1 tablet (200 mg total) by mouth daily. - Oral   E-Prescribing Status: Receipt confirmed by pharmacy (10/29/2015 2:24 PM EDT)   Pharmacy   [id number] [place], [place]

## 2016-02-01 ENCOUNTER — Other Ambulatory Visit: Payer: Self-pay | Admitting: Cardiology

## 2016-02-02 NOTE — Telephone Encounter (Signed)
amiodarone (PACERONE) 200 MG tablet  Medication  Date: 10/29/2015 Department: [place] Office Ordering/Authorizing: Will Meredith Leeds, MD  Order Providers   Prescribing Provider Encounter Provider  Will Meredith Leeds, MD Stanton Kidney, RN  Medication Detail    Disp Refills Start End   amiodarone (PACERONE) 200 MG tablet 30 tablet 11 10/29/2015    Sig - Route: Take 1 tablet (200 mg total) by mouth daily. - Oral   E-Prescribing Status: Receipt confirmed by pharmacy (10/29/2015 2:24 PM EDT)   Ph[facility]acy   [id number] [place], [place] - [place]

## 2016-02-14 ENCOUNTER — Other Ambulatory Visit: Payer: Self-pay | Admitting: Cardiology

## 2016-02-14 DIAGNOSIS — R0602 Shortness of breath: Secondary | ICD-10-CM

## 2016-02-16 ENCOUNTER — Ambulatory Visit (INDEPENDENT_AMBULATORY_CARE_PROVIDER_SITE_OTHER): Payer: Medicare Other | Admitting: Pharmacist Clinician (PhC)/ Clinical Pharmacy Specialist

## 2016-02-16 ENCOUNTER — Ambulatory Visit (INDEPENDENT_AMBULATORY_CARE_PROVIDER_SITE_OTHER): Payer: Medicare Other | Admitting: *Deleted

## 2016-02-16 ENCOUNTER — Telehealth: Payer: Self-pay | Admitting: Cardiology

## 2016-02-16 DIAGNOSIS — I480 Paroxysmal atrial fibrillation: Secondary | ICD-10-CM | POA: Diagnosis not present

## 2016-02-16 DIAGNOSIS — I4892 Unspecified atrial flutter: Secondary | ICD-10-CM

## 2016-02-16 DIAGNOSIS — Z7901 Long term (current) use of anticoagulants: Secondary | ICD-10-CM

## 2016-02-16 DIAGNOSIS — I481 Persistent atrial fibrillation: Secondary | ICD-10-CM

## 2016-02-16 DIAGNOSIS — R0602 Shortness of breath: Secondary | ICD-10-CM

## 2016-02-16 DIAGNOSIS — I4819 Other persistent atrial fibrillation: Secondary | ICD-10-CM

## 2016-02-16 LAB — POCT INR: INR: 4.4

## 2016-02-16 MED ORDER — CARVEDILOL 12.5 MG PO TABS: 12.5000 mg | ORAL_TABLET | Freq: Two times a day (BID) | ORAL | 9 refills | Status: DC

## 2016-02-16 NOTE — Telephone Encounter (Signed)
Pt needed refill for Coreg. [facility].

## 2016-02-16 NOTE — Telephone Encounter (Signed)
Spoke with pt and reminded pt of remote transmission that is due today. Pt verbalized understanding.   

## 2016-02-17 NOTE — Progress Notes (Signed)
Remote pacemaker transmission.   

## 2016-02-27 LAB — CUP PACEART REMOTE DEVICE CHECK
Brady Statistic AP VP Percent: 0.08 %
Brady Statistic AS VP Percent: 0 %
Brady Statistic AS VS Percent: 4.58 %
Brady Statistic RV Percent Paced: 0.08 %
Implantable Lead Implant Date: 20170519
Implantable Lead Location: 753859
Implantable Lead Model: 5076
Lead Channel Impedance Value: 323 Ohm
Lead Channel Impedance Value: 380 Ohm
Lead Channel Impedance Value: 456 Ohm
Lead Channel Impedance Value: 456 Ohm
Lead Channel Pacing Threshold Amplitude: 0.75 V
Lead Channel Pacing Threshold Pulse Width: 0.4 ms
Lead Channel Sensing Intrinsic Amplitude: 25.25 mV
Lead Channel Sensing Intrinsic Amplitude: 25.25 mV
Lead Channel Setting Pacing Amplitude: 1.5 V
Lead Channel Setting Pacing Amplitude: 2.5 V
Lead Channel Setting Pacing Pulse Width: 0.4 ms
MDC IDC LEAD IMPLANT DT: 20170519
MDC IDC LEAD LOCATION: 753860
MDC IDC MSMT BATTERY REMAINING LONGEVITY: 116 mo
MDC IDC MSMT BATTERY VOLTAGE: 3.03 V
MDC IDC MSMT LEADCHNL RA PACING THRESHOLD AMPLITUDE: 0.75 V
MDC IDC MSMT LEADCHNL RA PACING THRESHOLD PULSEWIDTH: 0.4 ms
MDC IDC MSMT LEADCHNL RA SENSING INTR AMPL: 1.875 mV
MDC IDC MSMT LEADCHNL RA SENSING INTR AMPL: 1.875 mV
MDC IDC PG IMPLANT DT: 20170519
MDC IDC SESS DTM: 20171113193731
MDC IDC SET LEADCHNL RV SENSING SENSITIVITY: 2.8 mV
MDC IDC STAT BRADY AP VS PERCENT: 95.34 %
MDC IDC STAT BRADY RA PERCENT PACED: 95.42 %

## 2016-03-03 ENCOUNTER — Telehealth: Payer: Self-pay | Admitting: Cardiology

## 2016-03-03 NOTE — Telephone Encounter (Signed)
New Message  Duplicate message see below

## 2016-03-03 NOTE — Telephone Encounter (Signed)
New Message  Pt c/o Shortness Of Breath: STAT if SOB developed within the last 24 hours or pt is noticeably SOB on the phone  1. Are you currently SOB (can you hear that pt is SOB on the phone)? Yes  2. How long have you been experiencing SOB? Two or Three weeks but has gotten worse two or three days  3. Are you SOB when sitting or when up moving around? Moving around  4. Are you currently experiencing any other symptoms? Tiredness all the time  Pt voiced she has sleeping pills but can not sleep.

## 2016-03-03 NOTE — Telephone Encounter (Signed)
Spoke with pt she states that she has been SOB and fatigued since she had her pacemaker placed 2-3 months ago. She get SOB when going up the stairs, walking out to the car. Pt denies any other symptoms ches pain or pressure, nausea, dizziness. Scheduled appt with [place], [place] Monday 03-08-16. Pt states that she is unable to get here any sooner and will call back if needed. Informed pt if sx worsen or any CP or pressure should go to ER

## 2016-03-07 ENCOUNTER — Encounter: Payer: Self-pay | Admitting: Student

## 2016-03-07 NOTE — Progress Notes (Signed)
Cardiology Office Note    Date:  03/08/2016   ID:  Natasha Fletcher, DOB 08/07/1925, MRN [id number]  PCP:  Myriam Jacobson, MD  Cardiologist: Dr. Claiborne Billings Electrophysiologist: Dr. Curt Bears  Chief Complaint  Patient presents with  . Follow-up  . Shortness of Breath    frequently, when doing any activity.     History of Present Illness:    Natasha Fletcher is a 82 y.o. female with past medical history of chronic diastolic CHF (EF 123456 by echo in 08/2015), PAF (s/p multiple DCCV's with most recent being in 06/2015, having been started on Amiodarone at that time), SSS (s/p PPM placement 08/2015), HTN, and right breast cancer who presented to the office today for evaluation of worsening dyspnea.   Was last seen by Dr. Curt Bears in 11/2015 and reported experiencing significant dyspnea when performing household chores and walking outside. She reported having lower extremity edema as well, therefore her Lasix dosing was increased from 20mg  daily to 40mg  daily. Was also hypertensive at 188/62 and her Lopressor was switched to Coreg 12.5mg  BID. Last device interrogation was on 02/27/2016 and showed no episodes of PAF since August.   In talking with the patient today, she reports continued dyspnea with exertion when performing household activities. According to her brother who is present today, he says she is overly active in the morning hours performing chores and gets "winded" with these activities. At night, she notes orthopnea saying it takes quite some time for her to get comfortable (sleeps with 2-3 pillows which is baseline). Also has lower extremity edema along her feet but says this is much improved today. Denies any associated chest discomfort or palpitations.   She reports good compliance with her PO Lasix dosing, taking 20mg  BID. Does not check her BP regularly but says it has been well-controlled at office visits. Weight has gone up 5 lbs on her home scales in the past month which is  consistent with readings in the office (140 lbs --> 145 lbs).   Past Medical History:  Diagnosis Date  . Arthritis    "all over"  . Asthma   . Atrial fibrillation ([place])    a. s/p DCCV in 01/2014 b. repeat DCCV in 06/2015, started on Amiodarone at that time. On Coumadin for anticoagulation.  . Cancer ([place])    rt breast  . Chronic diastolic CHF (congestive heart failure) ([place])    a. 08/2015: Echo with EF of 60-65%, no WMA, LA mod dilated, mild TR.  . Diabetes mellitus without complication ([place])   . Edema leg   . Fast breathing    "because of pill"  . GERD (gastroesophageal reflux disease)   . Glaucoma   . Glaucoma   . Hypertension   . Incontinence   . Pneumonia    h/o younger  . S/P total knee arthroplasty   . SSS (sick sinus syndrome) ([place])    a. s/p PPM placement in 08/2015  . Swelling    bilateral feet/ legs, more left.  . Thyroid disease    "something years ago"  . UTI (urinary tract infection) 02/12/2015  . Wears dentures   . Wears glasses     Past Surgical History:  Procedure Laterality Date  . ABDOMINAL HYSTERECTOMY    . APPENDECTOMY    . BREAST BIOPSY  04/27/2011   Procedure: BREAST BIOPSY WITH NEEDLE LOCALIZATION;  Surgeon: Edward Jolly, MD;  Location: [facility];  Service: General;  Laterality: Right;  right needle  localized breast lumpectomy   . BREAST LUMPECTOMY  04/27/11   right breast by [facility]  . CARDIOVERSION N/A 02/01/2014   Procedure: CARDIOVERSION;  Surgeon: Candee Furbish, MD;  Location: [facility] ENDOSCOPY;  Service: Cardiovascular;  Laterality: N/A;  . CARDIOVERSION N/A 06/04/2015   Procedure: CARDIOVERSION;  Surgeon: Dorothy Spark, MD;  Location: [facility];  Service: Cardiovascular;  Laterality: N/A;  . CARDIOVERSION N/A 07/14/2015   Procedure: CARDIOVERSION;  Surgeon: Thayer Headings, MD;  Location: [facility] ENDOSCOPY;  Service: Cardiovascular;  Laterality: N/A;  . CARPAL TUNNEL RELEASE Bilateral   . COLONOSCOPY    . EP IMPLANTABLE  DEVICE N/A 08/22/2015   Procedure: Pacemaker Implant;  Surgeon: Will Meredith Leeds, MD;  Location: [facility] CV LAB;  Service: Cardiovascular;  Laterality: N/A;  . EYE SURGERY     both cataracts  . SHOULDER SURGERY Left   . TEE WITHOUT CARDIOVERSION N/A 02/01/2014   Procedure: TRANSESOPHAGEAL ECHOCARDIOGRAM (TEE);  Surgeon: Candee Furbish, MD;  Location: [facility] ENDOSCOPY;  Service: Cardiovascular;  Laterality: N/A;  . TONSILLECTOMY    . TOTAL HIP ARTHROPLASTY Bilateral   . TOTAL KNEE ARTHROPLASTY Right 02/19/2013   Procedure: RIGHT TOTAL KNEE ARTHROPLASTY;  Surgeon: Gearlean Alf, MD;  Location: [facility] ORS;  Service: Orthopedics;  Laterality: Right;  . TOTAL KNEE ARTHROPLASTY Left 10/15/2013   Procedure: LEFT TOTAL KNEE ARTHROPLASTY;  Surgeon: Gearlean Alf, MD;  Location: [facility];  Service: Orthopedics;  Laterality: Left;    Current Medications: Outpatient Medications Prior to Visit  Medication Sig Dispense Refill  . acetaminophen (TYLENOL) 325 MG tablet Take 650 mg by mouth at bedtime.    Marland Kitchen amiodarone (PACERONE) 200 MG tablet Take 1 tablet (200 mg total) by mouth daily. 30 tablet 11  . anastrozole (ARIMIDEX) 1 MG tablet TAKE 1 TABLET BY MOUTH EVERY DAY 30 tablet 2  . carvedilol (COREG) 12.5 MG tablet Take 1 tablet (12.5 mg total) by mouth 2 (two) times daily. 60 tablet 9  . HYDROcodone-acetaminophen (NORCO) 10-325 MG tablet Take 1 tablet by mouth 2 (two) times daily as needed.  0  . latanoprost (XALATAN) 0.005 % ophthalmic solution Place 1 drop into both eyes at bedtime.    . meclizine (ANTIVERT) 25 MG tablet Take 12.5 mg by mouth 3 (three) times daily. Reported on 06/03/2015    . Protein (PROCEL 100 PO) Take 1 scoop by mouth 2 (two) times daily.    . sennosides-docusate sodium (SENOKOT-S) 8.6-50 MG tablet Take 1 tablet by mouth 2 (two) times daily.    . traZODone (DESYREL) 50 MG tablet Take 50 mg by mouth at bedtime.  0  . UNABLE TO FIND Med Name: MedPass 120 mL po TID between meals for  supplement and nutritional support    . warfarin (COUMADIN) 4 MG tablet Take 1 or 1.5 tablets by mouth as directed by Coumadin clinic 40 tablet 2  . furosemide (LASIX) 40 MG tablet Take 1 tablet (40 mg total) by mouth daily. 90 tablet 3   No facility-administered medications prior to visit.      Allergies:   Lactose intolerance (gi); Milk-related compounds; and Sulfa antibiotics   Social History   Social History  . Marital status: Single    Spouse name: N/A  . Number of children: N/A  . Years of education: N/A   Social History Main Topics  . Smoking status: Former Smoker    Packs/day: 0.00    Years: 30.00    Types: Cigarettes    Quit date: 04/21/1975  .  Smokeless tobacco: Never Used  . Alcohol use Yes     Comment: occasionally  . Drug use: No  . Sexual activity: Not Currently   Other Topics Concern  . None   Social History Narrative   From SNF, getting PT and using cane.       Family History:  The patient's family history includes Heart disease in her mother; Hypertension in her father and mother; Stroke in her brother, father, and mother.   Review of Systems:   Please see the history of present illness.     General:  No chills, fever, night sweats or weight changes.  Cardiovascular:  No chest pain, palpitations, paroxysmal nocturnal dyspnea. Positive for dyspnea on exertion, edema, and orthopnea.  Dermatological: No rash, lesions/masses Respiratory: No cough, Positive for dyspnea. Urologic: No hematuria, dysuria Abdominal:   No nausea, vomiting, diarrhea, bright red blood per rectum, melena, or hematemesis Neurologic:  No visual changes, wkns, changes in mental status. All other systems reviewed and are otherwise negative except as noted above.   Physical Exam:    VS:  BP 136/66   Pulse 60   Ht 5\' 4"  (1.626 m)   Wt 145 lb 6.4 oz (66 kg)   SpO2 100%   BMI 24.96 kg/m    General: Well developed, well nourished [place] American female appearing in no acute  distress. Head: Normocephalic, atraumatic, sclera non-icteric, no xanthomas, nares are without discharge.  Neck: No carotid bruits. JVD at 9cm.  Lungs: Respirations regular and unlabored, no wheezing or rales appeciated.  Heart: Regular rate and rhythm. No S3 or S4.  No murmur, no rubs, or gallops appreciated. Abdomen: Soft, non-tender, non-distended with normoactive bowel sounds. No hepatomegaly. No rebound/guarding. No obvious abdominal masses. Msk:  Strength and tone appear normal for age. No joint deformities or effusions. Extremities: No clubbing or cyanosis. Trace lower extremity edema bilaterally.  Distal pedal pulses are 2+ bilaterally. Neuro: Alert and oriented X 3. Moves all extremities spontaneously. No focal deficits noted. Psych:  Responds to questions appropriately with a normal affect. Skin: No rashes or lesions noted  Wt Readings from Last 3 Encounters:  03/08/16 145 lb 6.4 oz (66 kg)  11/17/15 140 lb 6.4 oz (63.7 kg)  09/10/15 139 lb 12.8 oz (63.4 kg)     Studies/Labs Reviewed:   EKG:  EKG is ordered today. The ekg ordered today demonstrates A-paced rhythm, HR 60, with no acute ST or T-wave changes.   Recent Labs: 08/22/2015: B Natriuretic Peptide 1,225.5; Magnesium 2.1; TSH 1.803 08/27/2015: ALT 12; BUN 43; Creatinine 1.4; Hemoglobin 11.1; Platelets 197; Potassium 4.1; Sodium 139   Lipid Panel No results found for: CHOL, TRIG, HDL, CHOLHDL, VLDL, LDLCALC, LDLDIRECT  Additional studies/ records that were reviewed today include:   Echocardiogram: 08/2015  Study Conclusions  - Left ventricle: The cavity size was normal. Wall thickness was   increased in a pattern of severe LVH. Systolic function was   normal. The estimated ejection fraction was in the range of 60%   to 65%. Wall motion was normal; there were no regional wall   motion abnormalities. The study is not technically sufficient to   allow evaluation of LV diastolic function. - Left atrium: The atrium  was moderately dilated. - Right atrium: The atrium was mildly dilated. - Pulmonary arteries: Systolic pressure was mildly increased.  Impressions:  - Normal LV systolic function; severe LVH; biatrial enlargement;   mild TR; mildly elevated pulmonary pressure.  Assessment:    1.  Chronic diastolic CHF (congestive heart failure) ([place])   2. Dyspnea on exertion   3. PAF (paroxysmal atrial fibrillation) ([place])   4. Chronic anticoagulation   5. SSS (sick sinus syndrome) ([facility])   6. Essential hypertension   7. Medication management      Plan:   In order of problems listed above:  1. Chronic Diastolic CHF/ Dyspnea on Exertion - patient reports having worsening dyspnea with exertion and orthopnea since PPM placement in 08/2015. When seen in the office in 11/2015, her Lasix dosing was increased from 20mg  daily to 40mg  daily. She reports this helped somewhat but she has still been experiencing DOE, orthopnea, and lower extremity edema. Denies any chest pain or palpitations. Weight has gone up 5 lbs since last office visit.  - Echo in 08/2015 showed preserved EF of 60-65% with no regional WMA.  - ambulatory O2 levels checked in the office today and remained at 99-100% at rest and with ambulation. - will check BNP and BMET today. Plan to increase Lasix to 40mg  in AM and 20mg  in PM. Her symptoms seem most consistent with CHF. Would consider NST for ischemic evaluation in the future if symptoms persist despite increasing her Lasix dosing but will hold off for now given her recent echo showing preserved EF/normal wall motion and her denying any recent chest discomfort.    2. Paroxysmal Atrial Fibrillation/ Chronic Anticoagulation - This patients CHA2DS2-VASc Score and unadjusted Ischemic Stroke Rate (% per year) is equal to 4.8 % stroke rate/year from a score of 4 (HTN, Female, Age (2)). Continue Coumadin for anticoagulation. For INR Check today. Denies any evidence of active bleeding.  - device  interrogation on 02/27/2016 showed no recurrent episodes of PAF. - continue Coreg 12.5mg  BID for rate-control.   3. SSS - s/p PPM placement in 08/2015. Device interrogation from 02/2016 reviewed.  - followed by Dr. Curt Bears.   4. HTN - BP elevated to 188/62 at last office visit with Lopressor being switched to Coreg 12.5mg  BID. - well-controlled at 136/66 during today's visit. Continue current medication regimen.    Medication Adjustments/Labs and Tests Ordered: Current medicines are reviewed at length with the patient today.  Concerns regarding medicines are outlined above.  Medication changes, Labs and Tests ordered today are listed in the Patient Instructions below. Patient Instructions   Medication Instructions: Bernerd Pho, PA-C has recommended making the following medication changes: 1. INCREASE Furosemide to 40 mg (2 tablets) in the morning and 20 mg (1 tablet) in the early afternoon  Labwork: Your physician recommends that you return for lab work TODAY.  Testing/Procedures: NONE ORDERED  Follow-up: Your physician recommends that you schedule a follow-up appointment in 1 month with Dr Claiborne Billings or [place], [place].  If you need a refill on your cardiac medications before your next appointment, please call your pharmacy.    Arna Medici, PA  03/08/2016 11:44 AM    [facility] [place], [address] [place], [place]  [address] Phone: [phone]; Fax: [phone]  [address], [place],  [address] Phone: [phone]

## 2016-03-08 ENCOUNTER — Ambulatory Visit (INDEPENDENT_AMBULATORY_CARE_PROVIDER_SITE_OTHER): Payer: Medicare Other | Admitting: Student

## 2016-03-08 ENCOUNTER — Ambulatory Visit (INDEPENDENT_AMBULATORY_CARE_PROVIDER_SITE_OTHER): Payer: Medicare Other | Admitting: Pharmacist Clinician (PhC)/ Clinical Pharmacy Specialist

## 2016-03-08 ENCOUNTER — Encounter: Payer: Self-pay | Admitting: Student

## 2016-03-08 VITALS — BP 136/66 | HR 60 | Ht 64.0 in | Wt 145.4 lb

## 2016-03-08 DIAGNOSIS — Z7901 Long term (current) use of anticoagulants: Secondary | ICD-10-CM | POA: Diagnosis not present

## 2016-03-08 DIAGNOSIS — I5032 Chronic diastolic (congestive) heart failure: Secondary | ICD-10-CM | POA: Diagnosis not present

## 2016-03-08 DIAGNOSIS — Z79899 Other long term (current) drug therapy: Secondary | ICD-10-CM

## 2016-03-08 DIAGNOSIS — I480 Paroxysmal atrial fibrillation: Secondary | ICD-10-CM | POA: Diagnosis not present

## 2016-03-08 DIAGNOSIS — R0609 Other forms of dyspnea: Secondary | ICD-10-CM

## 2016-03-08 DIAGNOSIS — I495 Sick sinus syndrome: Secondary | ICD-10-CM

## 2016-03-08 DIAGNOSIS — I10 Essential (primary) hypertension: Secondary | ICD-10-CM

## 2016-03-08 DIAGNOSIS — I4892 Unspecified atrial flutter: Secondary | ICD-10-CM

## 2016-03-08 DIAGNOSIS — R0600 Dyspnea, unspecified: Secondary | ICD-10-CM

## 2016-03-08 LAB — POCT INR: INR: 4.1

## 2016-03-08 MED ORDER — FUROSEMIDE 20 MG PO TABS: ORAL_TABLET | ORAL | 3 refills | Status: DC

## 2016-03-08 NOTE — Patient Instructions (Signed)
Medication Instructions: Bernerd Pho, PA-C has recommended making the following medication changes: 1. INCREASE Furosemide to 40 mg (2 tablets) in the morning and 20 mg (1 tablet) in the early afternoon  Labwork: Your physician recommends that you return for lab work TODAY.  Testing/Procedures: NONE ORDERED  Follow-up: Your physician recommends that you schedule a follow-up appointment in 1 month with Dr Claiborne Billings or [place], [place].  If you need a refill on your cardiac medications before your next appointment, please call your pharmacy.

## 2016-03-09 ENCOUNTER — Telehealth: Payer: Self-pay | Admitting: *Deleted

## 2016-03-09 DIAGNOSIS — Z79899 Other long term (current) drug therapy: Secondary | ICD-10-CM

## 2016-03-09 LAB — BASIC METABOLIC PANEL
BUN: 38 mg/dL — ABNORMAL HIGH (ref 7–25)
CO2: 30 mmol/L (ref 20–31)
Calcium: 9.2 mg/dL (ref 8.6–10.4)
Chloride: 104 mmol/L (ref 98–110)
Creat: 1.58 mg/dL — ABNORMAL HIGH (ref 0.60–0.88)
Glucose, Bld: 77 mg/dL (ref 65–99)
POTASSIUM: 4.2 mmol/L (ref 3.5–5.3)
SODIUM: 140 mmol/L (ref 135–146)

## 2016-03-09 LAB — BRAIN NATRIURETIC PEPTIDE: BRAIN NATRIURETIC PEPTIDE: 386.4 pg/mL — AB (ref <100)

## 2016-03-09 NOTE — Telephone Encounter (Signed)
-----   Message from Erma [place], [place] sent at 03/09/2016  2:10 PM EST ----- Please let the patient know labs are consistent with mild CHF exacerbation. Continue with increased Lasix dosing as we discussed yesterday. Repeat BMET in 2 weeks to assess kidney function and K+ (can be obtained at our office or PCP, whichever is easier for the patient). Thank you!

## 2016-03-09 NOTE — Telephone Encounter (Signed)
Pt aware of her lab results. She will go by the lab and have bmet redrawn 03/22/16. Orders in EPIC.

## 2016-03-15 ENCOUNTER — Telehealth: Payer: Self-pay | Admitting: Cardiovascular Disease

## 2016-03-15 NOTE — Telephone Encounter (Signed)
New Message  Called from [place] call requesting to speak with RN to f/u on two orders that were sent over. Please call back to discuss

## 2016-03-15 NOTE — Telephone Encounter (Signed)
Lmtcb.

## 2016-03-16 NOTE — Telephone Encounter (Signed)
lmtcb-Melinda Yong Channel

## 2016-03-16 NOTE — Telephone Encounter (Signed)
Spoke to [facility] and she is actually faxing something for Dr. Macky Lower signature, not Dr. Evette Georges. She faxed to the [facility] number. Routed to RN to f/u.

## 2016-03-16 NOTE — Telephone Encounter (Signed)
Left RN msg w [facility] clinical fax # and my direct line, to call if questions.

## 2016-03-16 NOTE — Telephone Encounter (Signed)
Follow up      Calling to let the nurse know that she is faxing the orders to the [phone] fax

## 2016-03-17 NOTE — Telephone Encounter (Signed)
Advised HHRN that Dr. Curt Bears did not order continuum of care: assessment, education, focus on fall prevention and mediation mngt on 10/24/15.  She will look into it and contact correct ordering provider for order signature. She thanks me for contacting her with information.

## 2016-03-19 ENCOUNTER — Telehealth: Payer: Self-pay | Admitting: Cardiovascular Disease

## 2016-03-19 NOTE — Telephone Encounter (Signed)
Spoke to patient. Scheduled for Rosaria Ferries on Monday 8:30am at her convenience. Pt voiced thanks for call and assistance.

## 2016-03-19 NOTE — Telephone Encounter (Signed)
Inform/discussed, with the patient, the difference between general cardiology and electrophysiology.  Explained that Dr. Claiborne Billings would be the physician who would advise on her current symptoms and pt verbalized understanding and agreeable to seeing Dr. Kelly/extender.   Spoke with Ovid Curd at [employer] office who will arrange follow up with an extender next week to address current concerns/complaints. She thanks me for explaining things to her and agreeable to plan.  She understands someone will call her today to arrange OV next week.

## 2016-03-19 NOTE — Telephone Encounter (Signed)
Spoke to patient. Pt of Dr. Claiborne Billings and [facility].  She notes she's been doing fairly well since her PM implantation a few months ago.  However, had concerns that she's been more short of breath with activity recently. She noticed this came on about 2 weeks ago. She denies chest pain, shortness of breath at rest, palpitations. States HR is fast "at times", not currently. She noted some slight swelling in legs. Informed pt may be OK to take extra 20mg  lasix to see if this improves SOB, will verify - note elevated cr & BUN on last BMET.  She sees Dr. Curt Bears for EP, would prefer to see him regarding her current symptoms. I do not have access to his schedule but may be appropriate to schedule for PA visit as well. Routed to [facility] triage to help address further.

## 2016-03-19 NOTE — Telephone Encounter (Signed)
New Message  Pt c/o Shortness Of Breath: STAT if SOB developed within the last 24 hours or pt is noticeably SOB on the phone  1. Are you currently SOB (can you hear that pt is SOB on the phone)? no  2. How long have you been experiencing SOB? Per pt last week, this week has gotten worse.   3. Are you SOB when sitting or when up moving around? Moving around   4. Are you currently experiencing any other symptoms? Per pt gets weak.

## 2016-03-19 NOTE — Telephone Encounter (Signed)
Line busy when dialed. Will reattempt later.

## 2016-03-22 ENCOUNTER — Encounter: Payer: Self-pay | Admitting: Physician Assistant

## 2016-03-22 ENCOUNTER — Ambulatory Visit (INDEPENDENT_AMBULATORY_CARE_PROVIDER_SITE_OTHER): Payer: Medicare Other | Admitting: Physician Assistant

## 2016-03-22 ENCOUNTER — Ambulatory Visit (INDEPENDENT_AMBULATORY_CARE_PROVIDER_SITE_OTHER): Payer: Medicare Other | Admitting: Pharmacist Clinician (PhC)/ Clinical Pharmacy Specialist

## 2016-03-22 VITALS — BP 124/62 | HR 64 | Ht 66.0 in | Wt 144.0 lb

## 2016-03-22 DIAGNOSIS — I480 Paroxysmal atrial fibrillation: Secondary | ICD-10-CM

## 2016-03-22 DIAGNOSIS — R0600 Dyspnea, unspecified: Secondary | ICD-10-CM

## 2016-03-22 DIAGNOSIS — R0609 Other forms of dyspnea: Secondary | ICD-10-CM

## 2016-03-22 DIAGNOSIS — Z79899 Other long term (current) drug therapy: Secondary | ICD-10-CM | POA: Diagnosis not present

## 2016-03-22 DIAGNOSIS — I5032 Chronic diastolic (congestive) heart failure: Secondary | ICD-10-CM

## 2016-03-22 DIAGNOSIS — Z7901 Long term (current) use of anticoagulants: Secondary | ICD-10-CM | POA: Diagnosis not present

## 2016-03-22 DIAGNOSIS — R5381 Other malaise: Secondary | ICD-10-CM

## 2016-03-22 DIAGNOSIS — I4891 Unspecified atrial fibrillation: Secondary | ICD-10-CM | POA: Insufficient documentation

## 2016-03-22 LAB — CBC WITH DIFFERENTIAL/PLATELET
BASOS PCT: 0 %
Basophils Absolute: 0 cells/uL (ref 0–200)
EOS ABS: 138 {cells}/uL (ref 15–500)
Eosinophils Relative: 3 %
HEMATOCRIT: 34.1 % — AB (ref 35.0–45.0)
HEMOGLOBIN: 10.4 g/dL — AB (ref 11.7–15.5)
LYMPHS ABS: 828 {cells}/uL — AB (ref 850–3900)
Lymphocytes Relative: 18 %
MCH: 27.5 pg (ref 27.0–33.0)
MCHC: 30.5 g/dL — ABNORMAL LOW (ref 32.0–36.0)
MCV: 90.2 fL (ref 80.0–100.0)
MONO ABS: 230 {cells}/uL (ref 200–950)
MONOS PCT: 5 %
MPV: 10.4 fL (ref 7.5–12.5)
NEUTROS ABS: 3404 {cells}/uL (ref 1500–7800)
Neutrophils Relative %: 74 %
PLATELETS: 233 10*3/uL (ref 140–400)
RBC: 3.78 MIL/uL — ABNORMAL LOW (ref 3.80–5.10)
RDW: 14.8 % (ref 11.0–15.0)
WBC: 4.6 10*3/uL (ref 3.8–10.8)

## 2016-03-22 LAB — COMPREHENSIVE METABOLIC PANEL
ALK PHOS: 87 U/L (ref 33–130)
ALT: 13 U/L (ref 6–29)
AST: 24 U/L (ref 10–35)
Albumin: 3.9 g/dL (ref 3.6–5.1)
BUN: 30 mg/dL — AB (ref 7–25)
CO2: 30 mmol/L (ref 20–31)
Calcium: 9.4 mg/dL (ref 8.6–10.4)
Chloride: 101 mmol/L (ref 98–110)
Creat: 1.56 mg/dL — ABNORMAL HIGH (ref 0.60–0.88)
Glucose, Bld: 89 mg/dL (ref 65–99)
POTASSIUM: 4.2 mmol/L (ref 3.5–5.3)
Sodium: 139 mmol/L (ref 135–146)
TOTAL PROTEIN: 6.9 g/dL (ref 6.1–8.1)
Total Bilirubin: 0.6 mg/dL (ref 0.2–1.2)

## 2016-03-22 LAB — POCT INR: INR: 2.2

## 2016-03-22 LAB — TSH: TSH: 1.33 m[IU]/L

## 2016-03-22 NOTE — Progress Notes (Addendum)
Cardiology Office Note   Date:  03/22/2016   ID:  Natasha Fletcher, DOB [birth year], MRN [id number]  PCP:  Myriam Jacobson, MD  Cardiologist:  Dr Claiborne Billings  Electrophysiologist: Dr Curt Bears B. Strader 03/08/2016 Rosaria Ferries, PA-C   Chief Complaint  Patient presents with  . Follow-up    SOB on exert 2 weeks    History of Present Illness: Natasha Fletcher is a 82 y.o. female with a history of D-CHF, EF 60-65% echo 08/2015, PAF (s/p multiple DCCV's most recent 06/2015, started on Amio then), SSS (s/p  MDT PPM placement 08/2015), HTN, and right breast cancer.  CHA2DS2-VASc Score and unadjusted Ischemic Stroke Rate (% per year) is equal to 4.8 % stroke rate/year from a score of 4 (HTN, Female, Age (2)).   12/04, seen for CHF, Lasix increased from 40 mg qd to 40 mg am and 20 mg pm. However, she has been taking the Lasix at 40 mg bid.   Natasha Fletcher presents for follow up.   Her weight has not not changed much, maybe 1 lb different. She weighs 140 lbs on her home scales. No dietary issues, she rinses frozen vegetables and does not add salt to them. She uses a little salt to cook meat, but not much.   Her breathing is still short, but the swelling has improved. She still has PND, but it is improved. However, she still has significant Fletcher. She is not able to to what she wants and gets SOB going up stairs. She has a split level home, so has to do stairs frequently. She wants to drive. When she goes to stores and walks around, she gets very SOB.   She was not aware that she had a heart murmur. She has not had problems with being light-headed or dizzy.   She has not had cramping in her legs. She ambulated in the office because of her Fletcher, but her oxygen levels did not drop below 94%. She reported SOB, but was also ambulating poorly at baseline. She appeared weak and deconditioned. She likes to keep busy, but has not actually been walking much.    Past Medical History:  Diagnosis Date    . Arthritis    "all over"  . Asthma   . Atrial fibrillation ([place])    a. s/p DCCV in 01/2014 b. repeat DCCV in 06/2015, started on Amiodarone at that time. On Coumadin for anticoagulation.  . Cancer ([place])    rt breast  . Chronic diastolic CHF (congestive heart failure) ([place])    a. 08/2015: Echo with EF of 60-65%, no WMA, LA mod dilated, mild TR.  . Diabetes mellitus without complication ([place])   . Edema leg   . Fast breathing    "because of pill"  . GERD (gastroesophageal reflux disease)   . Glaucoma   . Glaucoma   . Hypertension   . Incontinence   . Pneumonia    h/o younger  . S/P total knee arthroplasty   . SSS (sick sinus syndrome) ([place])    a. s/p Medtronic Advisa DR MRI [id number] (serial number [id number]) PPM placement in 08/2015  . Swelling    bilateral feet/ legs, more left.  . Thyroid disease    "something years ago"  . UTI (urinary tract infection) 02/12/2015  . Wears dentures   . Wears glasses     Past Surgical History:  Procedure Laterality Date  . ABDOMINAL HYSTERECTOMY    . APPENDECTOMY    .  BREAST BIOPSY  04/27/2011   Procedure: BREAST BIOPSY WITH NEEDLE LOCALIZATION;  Surgeon: Edward Jolly, MD;  Location: [place];  Service: General;  Laterality: Right;  right needle localized breast lumpectomy   . BREAST LUMPECTOMY  04/27/11   right breast by [facility]  . CARDIOVERSION N/A 02/01/2014   Procedure: CARDIOVERSION;  Surgeon: Candee Furbish, MD;  Location: [facility] ENDOSCOPY;  Service: Cardiovascular;  Laterality: N/A;  . CARDIOVERSION N/A 06/04/2015   Procedure: CARDIOVERSION;  Surgeon: Dorothy Spark, MD;  Location: [place];  Service: Cardiovascular;  Laterality: N/A;  . CARDIOVERSION N/A 07/14/2015   Procedure: CARDIOVERSION;  Surgeon: Thayer Headings, MD;  Location: [facility] ENDOSCOPY;  Service: Cardiovascular;  Laterality: N/A;  . CARPAL TUNNEL RELEASE Bilateral   . COLONOSCOPY    . EP IMPLANTABLE DEVICE N/A 08/22/2015   Procedure: Pacemaker  Implant;  Surgeon: Will Meredith Leeds, MD;  Medtronic Advisa DR MRI [phone] [id number] (serial number [id number]);  Laterality: N/A;  . EYE SURGERY     both cataracts  . SHOULDER SURGERY Left   . TEE WITHOUT CARDIOVERSION N/A 02/01/2014   Procedure: TRANSESOPHAGEAL ECHOCARDIOGRAM (TEE);  Surgeon: Candee Furbish, MD;  Location: [facility] ENDOSCOPY;  Service: Cardiovascular;  Laterality: N/A;  . TONSILLECTOMY    . TOTAL HIP ARTHROPLASTY Bilateral   . TOTAL KNEE ARTHROPLASTY Right 02/19/2013   Procedure: RIGHT TOTAL KNEE ARTHROPLASTY;  Surgeon: Gearlean Alf, MD;  Location: [facility];  Service: Orthopedics;  Laterality: Right;  . TOTAL KNEE ARTHROPLASTY Left 10/15/2013   Procedure: LEFT TOTAL KNEE ARTHROPLASTY;  Surgeon: Gearlean Alf, MD;  Location: [facility];  Service: Orthopedics;  Laterality: Left;    Current Outpatient Prescriptions  Medication Sig Dispense Refill  . amiodarone (PACERONE) 200 MG tablet Take 1 tablet (200 mg total) by mouth daily. 30 tablet 11  . anastrozole (ARIMIDEX) 1 MG tablet TAKE 1 TABLET BY MOUTH EVERY DAY 30 tablet 2  . carvedilol (COREG) 12.5 MG tablet Take 1 tablet (12.5 mg total) by mouth 2 (two) times daily. 60 tablet 9  . furosemide (LASIX) 20 MG tablet Take 2 tablets (40 mg total) by mouth every morning and take 1 tablet (20 mg total) by mouth every evening. (Patient taking differently: Take 2 tablets (40 mg total) by mouth every morning and take 2 tablets (40 mg total) by mouth every evening.) 270 tablet 3  . HYDROcodone-acetaminophen (NORCO) 10-325 MG tablet Take 1 tablet by mouth 2 (two) times daily as needed.  0  . latanoprost (XALATAN) 0.005 % ophthalmic solution Place 1 drop into both eyes at bedtime.    . Protein (PROCEL 100 PO) Take 1 scoop by mouth 2 (two) times daily.    . traZODone (DESYREL) 50 MG tablet Take 50 mg by mouth at bedtime as needed.   0  . UNABLE TO FIND Med Name: MedPass 120 mL po TID between meals for supplement and nutritional support    . warfarin  (COUMADIN) 4 MG tablet Take 1 or 1.5 tablets by mouth as directed by Coumadin clinic 40 tablet 2   No current facility-administered medications for this visit.     Allergies:   Lactose intolerance (gi); Milk-related compounds; and Sulfa antibiotics    Social History:  The patient  reports that she quit smoking about 40 years ago. Her smoking use included Cigarettes. She smoked 0.00 packs per day for 30.00 years. She has never used smokeless tobacco. She reports that she drinks alcohol. She reports that she does  not use drugs.   Family History:  The patient's family history includes Heart disease in her mother; Hypertension in her father and mother; Stroke in her brother, father, and mother.    ROS:  Please see the history of present illness. All other systems are reviewed and negative.    PHYSICAL EXAM: VS:  BP 124/62   Pulse 64   Ht 5\' 6"  (1.676 m)   Wt 144 lb (65.3 kg)   BMI 23.24 kg/m  , BMI Body mass index is 23.24 kg/m. GEN: Well nourished, well developed, female in no acute distress  HEENT: normal for age  Neck: minimal JVD, no carotid bruit, no masses Cardiac: RRR; 2/6 murmur, no rubs, or gallops Respiratory:  Decreased BS bases bilaterally, normal work of breathing GI: soft, nontender, nondistended, + BS MS: no deformity or atrophy; no edema; distal pulses are 2+ in all 4 extremities; generalized weakness   Skin: warm and dry, no rash Neuro:  Strength and sensation are intact Psych: euthymic mood, full affect   EKG:  EKG is not ordered today.  ECHO: 08/2015 - Left ventricle: The cavity size was normal. Wall thickness was   increased in a pattern of severe LVH. Systolic function was   normal. The estimated ejection fraction was in the range of 60%   to 65%. Wall motion was normal; there were no regional wall   motion abnormalities. The study is not technically sufficient to   allow evaluation of LV diastolic function. - Aortic valve:   Trileaflet; normal thickness,  mildly calcified leaflets. Mobility was not restricted.  Doppler:  Transvalvular velocity was within the normal range. There was no stenosis. There was no regurgitation. - Left atrium: The atrium was moderately dilated. - Right atrium: The atrium was mildly dilated. - Pulmonary arteries: Systolic pressure was mildly increased. Impressions: - Normal LV systolic function; severe LVH; biatrial enlargement;   mild TR; mildly elevated pulmonary pressure.  Recent Labs: 08/22/2015: Magnesium 2.1; TSH 1.803 08/27/2015: ALT 12; Hemoglobin 11.1; Platelets 197 03/08/2016: Brain Natriuretic Peptide 386.4; BUN 38; Creat 1.58; Potassium 4.2; Sodium 140    Lipid Panel No results found for: CHOL, TRIG, HDL, CHOLHDL, VLDL, LDLCALC, LDLDIRECT   Wt Readings from Last 3 Encounters:  03/22/16 144 lb (65.3 kg)  03/08/16 145 lb 6.4 oz (66 kg)  11/17/15 140 lb 6.4 oz (63.7 kg)     Other studies Reviewed: Additional studies/ records that were reviewed today include: office notes, hospital records and testing.  ASSESSMENT AND PLAN:  1.  Chronic diastolic CHF: Her weight is stable, but her Fletcher has not improved much. Her symptoms have improved, but she wants to do more. I do not find significant volume overload on exam and her O2 sats remain good with ambulation. Continue increased Lasix dose and ck BMET today.  2. Fletcher with abnormal echo: Her murmur is consistent with AS, but she had no stenosis on echo 08/2015. She has LVH, and likely has diastolic dysfunction. Keep her as dry as her BP and renal function will allow, but she is encouraged to let us know if she begins getting light-headed or dizzy. May need to ck her echo at 1 year to follow these issues. She is not anemic by exam, but with chronic anticoagulation, will ck CBC.  3. PAF: by exam, she is maintaining SR on amio. Her coumadin was checked today and was therapeutic. CHA2DS2VASc=6 (female, age x 2, HTN, DM, CHF). Check TSH, CMET   4.  Deconditioning: Pt  ambulates poorly and appears deconditioned. She is interested in physical therapy and we will make the referral. I feel she is too weak to drive.   Current medicines are reviewed at length with the patient today.  The patient does not have concerns regarding medicines.  The following changes have been made:  no change  Labs/ tests ordered today include:   Orders Placed This Encounter  Procedures  . CBC with Differential  . Comprehensive Metabolic Panel (CMET)  . TSH  . Ambulatory referral to Home Health     Disposition:   FU with Dr Claiborne Billings  Signed, Rosaria Ferries, PA-C  03/22/2016 1:17 PM    [place] Phone: [phone]; Fax: [phone]  This note was written with the assistance of speech recognition software. Please excuse any transcriptional errors.

## 2016-03-22 NOTE — Patient Instructions (Addendum)
Medication Instructions:  Your physician recommends that you continue on your current medications as directed. Please refer to the Current Medication list given to you today.   Labwork: Cmet, Cbc, Tsh today.  Please have these drawn @ [facility] lab on the 1st floor  Testing/Procedures: None ordered  Follow-Up: Follow up as planned with Dr.Kelly on 04/27/16 @ 11am   Any Other Special Instructions Will Be Listed Below (If Applicable). You have been referred to [facility] (Physical Therapy) the home health service will contact you directly to set it up      If you need a refill on your cardiac medications before your next appointment, please call your pharmacy.

## 2016-03-26 ENCOUNTER — Other Ambulatory Visit: Payer: Self-pay | Admitting: Physician Assistant

## 2016-03-26 DIAGNOSIS — R0600 Dyspnea, unspecified: Secondary | ICD-10-CM

## 2016-04-13 ENCOUNTER — Telehealth: Payer: Self-pay | Admitting: Physician Assistant

## 2016-04-13 ENCOUNTER — Ambulatory Visit (INDEPENDENT_AMBULATORY_CARE_PROVIDER_SITE_OTHER): Payer: Medicare HMO | Admitting: Pharmacist

## 2016-04-13 DIAGNOSIS — I480 Paroxysmal atrial fibrillation: Secondary | ICD-10-CM | POA: Diagnosis not present

## 2016-04-13 DIAGNOSIS — Z7901 Long term (current) use of anticoagulants: Secondary | ICD-10-CM

## 2016-04-13 DIAGNOSIS — I4891 Unspecified atrial fibrillation: Secondary | ICD-10-CM

## 2016-04-13 LAB — POCT INR: INR: 2

## 2016-04-13 NOTE — Telephone Encounter (Addendum)
Pt here for coumadin check. Asked to speak with pt re: DOE  Pt has not been weighing daily, but wt has been stable at home - 140 lbs.  She is frustrated by the same things we discussed at the last office visit. She gets SOB when she tries to do things. Living in a split-level home is hard because she gets so SOB going up stairs.  When she walks from the car to the store, bank, etc, she has to rest when she gets to the door. When she does things at home, she can't do very much because she has to rest.   She does not do any organized activity. She was doing water aerobics, but cannot do that now. I had asked her not to drive. She does not feel her breathing is any different now than it was, she just wants things to be better.  Discussed things with her and her son.  She is extremely weak. She needs to work on increasing stamina, but going to a gym is too much. If she can walk around in the house, trying to go further each day, or use a small foot-pedal machine to get exercise, that would help. She is planning to sell her house, I encouraged this. A one-level would be easier for her.  Accept that (at 89), she has limitations she did not used to have.  Keep her spirit going and keep challenging herself, but do not over-exert.  Keep f/u appt.

## 2016-04-20 DIAGNOSIS — G4700 Insomnia, unspecified: Secondary | ICD-10-CM | POA: Diagnosis not present

## 2016-04-22 ENCOUNTER — Other Ambulatory Visit (HOSPITAL_COMMUNITY): Payer: Medicare Other

## 2016-04-23 ENCOUNTER — Other Ambulatory Visit: Payer: Self-pay

## 2016-04-23 ENCOUNTER — Ambulatory Visit (HOSPITAL_COMMUNITY): Payer: Medicare HMO | Attending: Cardiovascular Disease

## 2016-04-23 DIAGNOSIS — I509 Heart failure, unspecified: Secondary | ICD-10-CM | POA: Diagnosis not present

## 2016-04-23 DIAGNOSIS — I361 Nonrheumatic tricuspid (valve) insufficiency: Secondary | ICD-10-CM | POA: Diagnosis not present

## 2016-04-23 DIAGNOSIS — R0600 Dyspnea, unspecified: Secondary | ICD-10-CM | POA: Diagnosis not present

## 2016-04-23 DIAGNOSIS — I110 Hypertensive heart disease with heart failure: Secondary | ICD-10-CM | POA: Insufficient documentation

## 2016-04-23 DIAGNOSIS — I495 Sick sinus syndrome: Secondary | ICD-10-CM | POA: Diagnosis not present

## 2016-04-23 DIAGNOSIS — I272 Pulmonary hypertension, unspecified: Secondary | ICD-10-CM | POA: Diagnosis not present

## 2016-04-23 DIAGNOSIS — E119 Type 2 diabetes mellitus without complications: Secondary | ICD-10-CM | POA: Insufficient documentation

## 2016-04-27 ENCOUNTER — Ambulatory Visit (INDEPENDENT_AMBULATORY_CARE_PROVIDER_SITE_OTHER): Payer: Medicare HMO | Admitting: Pharmacist Clinician (PhC)/ Clinical Pharmacy Specialist

## 2016-04-27 ENCOUNTER — Encounter: Payer: Self-pay | Admitting: Cardiovascular Disease

## 2016-04-27 ENCOUNTER — Ambulatory Visit (INDEPENDENT_AMBULATORY_CARE_PROVIDER_SITE_OTHER): Payer: Medicare HMO | Admitting: Cardiovascular Disease

## 2016-04-27 VITALS — BP 115/62 | HR 60 | Ht 66.0 in | Wt 148.2 lb

## 2016-04-27 DIAGNOSIS — Z79899 Other long term (current) drug therapy: Secondary | ICD-10-CM

## 2016-04-27 DIAGNOSIS — I495 Sick sinus syndrome: Secondary | ICD-10-CM

## 2016-04-27 DIAGNOSIS — Z7901 Long term (current) use of anticoagulants: Secondary | ICD-10-CM

## 2016-04-27 DIAGNOSIS — I480 Paroxysmal atrial fibrillation: Secondary | ICD-10-CM

## 2016-04-27 DIAGNOSIS — I4891 Unspecified atrial fibrillation: Secondary | ICD-10-CM

## 2016-04-27 DIAGNOSIS — I5032 Chronic diastolic (congestive) heart failure: Secondary | ICD-10-CM

## 2016-04-27 DIAGNOSIS — I10 Essential (primary) hypertension: Secondary | ICD-10-CM

## 2016-04-27 DIAGNOSIS — I272 Pulmonary hypertension, unspecified: Secondary | ICD-10-CM

## 2016-04-27 LAB — POCT INR: INR: 2.8

## 2016-04-27 MED ORDER — LOSARTAN POTASSIUM 25 MG PO TABS: ORAL_TABLET | ORAL | 3 refills | Status: DC

## 2016-04-27 NOTE — Patient Instructions (Signed)
Your physician has recommended you make the following change in your medication:   1.) start new prescription given today for losartan 25 mg . Take as directed on the bottle.  Your physician recommends that you return for lab work in: 3 weeks.   Your physician recommends that you schedule a follow-up appointment in: 2 months.

## 2016-04-29 NOTE — Progress Notes (Signed)
Patient ID: Natasha Fletcher, female   DOB: 08-18-1925, 83 y.o.   MRN: [id number]     HPI: Jamilah Jean Sowash is a 83 y.o. female who presents to the office today for a 10 month follow-up cardiology evaluation.    Ms. Lasalle is status post right total knee replacement in July 2015 and developed postoperative anemia, urinary retention and dehydration.  She underwent red cell transfusion.  In August 2015, she developed acute renal failure was omitted to the emergency room with a creatinine of 7.2, potassium 6.2.  She was hydrated, her ACE inhibitor inhibitor was discontinued and a Foley was placed.  She subsequently developed an episode of presyncope and was found to be in atrial fibrillation with RVR leading to her readmission.  Several days later.  Cardiology consultation was obtained in the hospital on 11/11/2013.  An echo revealed ejection fraction of 65-70% with grade 2 diastolic dysfunction and there was evidence for an intracavity gradient of 16 mm.  She was on anticoagulation and ultimately underwent cardioversion in October 2015.  She was in normal sinus rhythm.  On subsequent follow-up in November 2015.  She saw Kerin Ransom in March 2016.  When I saw  her in October 2016 her ECG showed atrial flutter with 4:1 conduction.  I recommended an echo Doppler study which showed an EF of 55-60% and evidence for severe LVH.  There was moderate left atrial and mild biatrial dilatation.  I was to see her back in follow-up but I have not seen her since. Since I saw her in December 2016 she was admitted to [facility] hospital with a UTI and was in atrial fibrillation. Cardizem and metoprolol were increased, although she developed significant bradycardia and hypotension leading to subsequent dose reduction. In early February.  She was found to be in atrial fibrillation with rapid ventricular response.  Since her hospitalization, she has been seen by several APPs. She also really was referred for a cardioversion for atrial  fibrillation which was done on 06/04/2015 by Dr. Meda Coffee. She presents now for follow-up evaluation.  Since I last saw her, she underwent an Medtronic permanent pacemaker implantation for sick sinus syndrome in May 2017.  She has been on chronic anticoagulation with warfarin.  She has had issues with shortness of breath and lower extremity edema necessitating increasing diuretic therapy.  She was last seen by Rosaria Ferries on March 22, 2016.  Presently, she has noticed some mild shortness of breath and rare incidences of dizziness.  She is unaware of tachycardic events.  She underwent an echo Doppler study on 04/23/2016 which showed severe LVH with normal systolic function with an EF of 60-65%.  There was grade 2 diastolic dysfunction.  Was moderate tricuspid regurgitation.  She had severe PA pressure increased at 69 mmHg.  She presents for evaluation   Past Medical History:  Diagnosis Date  . Arthritis    "all over"  . Asthma   . Atrial fibrillation ([place])    a. s/p DCCV in 01/2014 b. repeat DCCV in 06/2015, started on Amiodarone at that time. On Coumadin for anticoagulation.  . Cancer ([place])    rt breast  . Chronic diastolic CHF (congestive heart failure) ([place])    a. 08/2015: Echo with EF of 60-65%, no WMA, LA mod dilated, mild TR.  . Diabetes mellitus without complication ([place])   . Edema leg   . Fast breathing    "because of pill"  . GERD (gastroesophageal reflux disease)   . Glaucoma   .  Glaucoma   . Hypertension   . Incontinence   . Pneumonia    h/o younger  . S/P total knee arthroplasty   . SSS (sick sinus syndrome) ([place])    a. s/p Medtronic Advisa DR MRI [id number] (serial number [id number]) PPM placement in 08/2015  . Swelling    bilateral feet/ legs, more left.  . Thyroid disease    "something years ago"  . UTI (urinary tract infection) 02/12/2015  . Wears dentures   . Wears glasses     Past Surgical History:  Procedure Laterality Date  . ABDOMINAL HYSTERECTOMY      . APPENDECTOMY    . BREAST BIOPSY  04/27/2011   Procedure: BREAST BIOPSY WITH NEEDLE LOCALIZATION;  Surgeon: Edward Jolly, MD;  Location: [place];  Service: General;  Laterality: Right;  right needle localized breast lumpectomy   . BREAST LUMPECTOMY  04/27/11   right breast by [place]  . CARDIOVERSION N/A 02/01/2014   Procedure: CARDIOVERSION;  Surgeon: Candee Furbish, MD;  Location: [facility] ENDOSCOPY;  Service: Cardiovascular;  Laterality: N/A;  . CARDIOVERSION N/A 06/04/2015   Procedure: CARDIOVERSION;  Surgeon: Dorothy Spark, MD;  Location: [place];  Service: Cardiovascular;  Laterality: N/A;  . CARDIOVERSION N/A 07/14/2015   Procedure: CARDIOVERSION;  Surgeon: Thayer Headings, MD;  Location: [facility] ENDOSCOPY;  Service: Cardiovascular;  Laterality: N/A;  . CARPAL TUNNEL RELEASE Bilateral   . COLONOSCOPY    . EP IMPLANTABLE DEVICE N/A 08/22/2015   Procedure: Pacemaker Implant;  Surgeon: Will Meredith Leeds, MD;  Medtronic Advisa DR MRI [id number] (serial number [id number]);  Laterality: N/A;  . EYE SURGERY     both cataracts  . SHOULDER SURGERY Left   . TEE WITHOUT CARDIOVERSION N/A 02/01/2014   Procedure: TRANSESOPHAGEAL ECHOCARDIOGRAM (TEE);  Surgeon: Candee Furbish, MD;  Location: [facility] ENDOSCOPY;  Service: Cardiovascular;  Laterality: N/A;  . TONSILLECTOMY    . TOTAL HIP ARTHROPLASTY Bilateral   . TOTAL KNEE ARTHROPLASTY Right 02/19/2013   Procedure: RIGHT TOTAL KNEE ARTHROPLASTY;  Surgeon: Gearlean Alf, MD;  Location: [facility];  Service: Orthopedics;  Laterality: Right;  . TOTAL KNEE ARTHROPLASTY Left 10/15/2013   Procedure: LEFT TOTAL KNEE ARTHROPLASTY;  Surgeon: Gearlean Alf, MD;  Location: [facility];  Service: Orthopedics;  Laterality: Left;    Allergies  Allergen Reactions  . Lactose Intolerance (Gi) Other (See Comments)    Stomach pain  . Milk-Related Compounds Other (See Comments)    Upset stomach   . Sulfa Antibiotics Itching    Current Outpatient  Prescriptions  Medication Sig Dispense Refill  . amiodarone (PACERONE) 200 MG tablet Take 1 tablet (200 mg total) by mouth daily. 30 tablet 11  . anastrozole (ARIMIDEX) 1 MG tablet TAKE 1 TABLET BY MOUTH EVERY DAY 30 tablet 2  . carvedilol (COREG) 12.5 MG tablet Take 1 tablet (12.5 mg total) by mouth 2 (two) times daily. 60 tablet 9  . furosemide (LASIX) 20 MG tablet Take 2 tablets (40 mg total) by mouth every morning and take 1 tablet (20 mg total) by mouth every evening. (Patient taking differently: Take 2 tablets (40 mg total) by mouth every morning and take 2 tablets (40 mg total) by mouth every evening.) 270 tablet 3  . HYDROcodone-acetaminophen (NORCO) 10-325 MG tablet Take 1 tablet by mouth 2 (two) times daily as needed.  0  . latanoprost (XALATAN) 0.005 % ophthalmic solution Place 1 drop into both eyes at bedtime.    Marland Kitchen  Protein (PROCEL 100 PO) Take 1 scoop by mouth 2 (two) times daily.    . traZODone (DESYREL) 50 MG tablet Take 50 mg by mouth at bedtime as needed.   0  . UNABLE TO FIND Med Name: MedPass 120 mL po TID between meals for supplement and nutritional support    . warfarin (COUMADIN) 4 MG tablet Take 1 or 1.5 tablets by mouth as directed by Coumadin clinic 40 tablet 2  . losartan (COZAAR) 25 MG tablet Take 1/2 tablet for 1 week week then increase to 1 tablet daily. 30 tablet 3   No current facility-administered medications for this visit.     Social History   Social History  . Marital status: Single    Spouse name: N/A  . Number of children: N/A  . Years of education: N/A   Occupational History  . Not on file.   Social History Main Topics  . Smoking status: Former Smoker    Packs/day: 0.00    Years: 30.00    Types: Cigarettes    Quit date: 04/21/1975  . Smokeless tobacco: Never Used  . Alcohol use Yes     Comment: occasionally  . Drug use: No  . Sexual activity: Not Currently   Other Topics Concern  . Not on file   Social History Narrative   From SNF,  getting PT and using cane.      Family History  Problem Relation Age of Onset  . Heart disease Mother   . Stroke Mother   . Hypertension Mother   . Stroke Father   . Hypertension Father   . Stroke Brother   . Heart attack Neg Hx     ROS General: Negative; No fevers, chills, or night sweats HEENT: Negative; No changes in vision or hearing, sinus congestion, difficulty swallowing Pulmonary: Negative; No cough, wheezing, shortness of breath, hemoptysis Cardiovascular: See HPI:  GI: Negative; No nausea, vomiting, diarrhea, or abdominal pain GU: Negative; No dysuria, hematuria, or difficulty voiding Musculoskeletal: Negative; no myalgias, joint pain, or weakness Hematologic: Negative; no easy bruising, bleeding Endocrine: Negative; no heat/cold intolerance; no diabetes, Neuro: Negative; no changes in balance, headaches Skin: Negative; No rashes or skin lesions Psychiatric: Negative; No behavioral problems, depression Sleep: Negative; No snoring,  daytime sleepiness, hypersomnolence, bruxism, restless legs, hypnogognic hallucinations. Other comprehensive 14 point system review is negative   Physical Exam BP 115/62   Pulse 60   Ht 5' 6"  (1.676 m)   Wt 148 lb 3.2 oz (67.2 kg)   BMI 23.92 kg/m    Repeat blood pressure by me 128/72.  Wt Readings from Last 3 Encounters:  04/27/16 148 lb 3.2 oz (67.2 kg)  03/22/16 144 lb (65.3 kg)  03/08/16 145 lb 6.4 oz (66 kg)   General: Alert, oriented, no distress.  Skin: normal turgor, no rashes, warm and dry HEENT: Normocephalic, atraumatic. Pupils equal round and reactive to light; sclera anicteric; extraocular muscles intact, No lid lag; Nose without nasal septal hypertrophy; Mouth/Parynx benign; Mallinpatti scale 3 Neck: No JVD, no carotid bruits; normal carotid upstroke Lungs: clear to ausculatation and percussion bilaterally; no wheezing or rales, normal inspiratory and expiratory effort Chest wall: without tenderness to  palpitation Heart: Regular rate and rhythm.  1/6 systolic murmur, No diastolic murmur, no rubs, gallops, thrills, or heaves Abdomen: soft, nontender; no hepatosplenomehaly, BS+; abdominal aorta nontender and not dilated by palpation. Back: no CVA tenderness Pulses: 2+  Musculoskeletal: full range of motion, normal strength, no joint deformities Extremities: Pulses  2+, mild lower extremity edema., Homan's sign negative  Neurologic: grossly nonfocal; Cranial nerves grossly wnl Psychologic: Normal mood and affect  ECG (independently read by me): Atrially paced rhythm at 60 bpm.  Prolonged AV conduction with a PR interval at 270 ms.  Nonspecific ST-T changes.  March 2017 ECG (independently read by me): Atrial flutter with a ventricular rate at 87 bpm with variable block.  Nonspecific ST-T changes.  My personal review of her March 2016 ECG shows sinus bradycardia at 58  LABS:  BMP Latest Ref Rng & Units 03/22/2016 03/08/2016 08/27/2015  Glucose 65 - 99 mg/dL 89 77 -  BUN 7 - 25 mg/dL 30(H) 38(H) 43(A)  Creatinine 0.60 - 0.88 mg/dL 1.56(H) 1.58(H) 1.4(A)  Sodium 135 - 146 mmol/L 139 140 139  Potassium 3.5 - 5.3 mmol/L 4.2 4.2 4.1  Chloride 98 - 110 mmol/L 101 104 -  CO2 20 - 31 mmol/L 30 30 -  Calcium 8.6 - 10.4 mg/dL 9.4 9.2 -    Hepatic Function Latest Ref Rng & Units 03/22/2016 08/27/2015 06/03/2015  Total Protein 6.1 - 8.1 g/dL 6.9 - 6.7  Albumin 3.6 - 5.1 g/dL 3.9 - 3.6  AST 10 - 35 U/L 24 30 38(H)  ALT 6 - 29 U/L 13 12 30   Alk Phosphatase 33 - 130 U/L 87 119 100  Total Bilirubin 0.2 - 1.2 mg/dL 0.6 - 1.01    CBC Latest Ref Rng & Units 03/22/2016 08/27/2015 08/23/2015  WBC 3.8 - 10.8 K/uL 4.6 4.8 6.4  Hemoglobin 11.7 - 15.5 g/dL 10.4(L) 11.1(A) 10.4(L)  Hematocrit 35.0 - 45.0 % 34.1(L) 36 33.3(L)  Platelets 140 - 400 K/uL 233 197 224   Lab Results  Component Value Date   MCV 90.2 03/22/2016   MCV 86.5 08/23/2015   MCV 85.2 08/22/2015    Lab Results  Component Value Date    TSH 1.33 03/22/2016    BNP    Component Value Date/Time   BNP 386.4 (H) 03/08/2016 1024    ProBNP    Component Value Date/Time   PROBNP 3,143.0 (H) 11/17/2013 0442    Lipid Panel  No results found for: CHOL, TRIG, HDL, CHOLHDL, VLDL, LDLCALC, LDLDIRECT   RADIOLOGY: No results found.  IMPRESSION:  1. PAF (paroxysmal atrial fibrillation) ([place])   2. Benign essential HTN   3. Long-term (current) use of anticoagulants   4. Chronic anticoagulation   5. On amiodarone therapy   6. Chronic diastolic CHF (congestive heart failure) ([place])   7. Medication management   8. SSS (sick sinus syndrome) ([facility])   9. Pulmonary hypertension     ASSESSMENT AND PLAN: Ms. Coryell is an 83 year old African-American female who has a history of atrial flutter in PAF and ultimately underwent insertion of a permanent Place maker for sick sinus syndrome in May 2017.  She has a history of hypertension, chronic diastolic heart failure and on her most recent echo Doppler study has significant pulmonary artery pressure elevation.  She admits to shortness of breath.  Her ECG today shows atrially paced rhythm with prolonged AV conduction.  Her blood pressure is controlled on her current medical regimen consisting of 12.5 mg twice a day and she has been taking furosemide 40 mg in the morning and 20 mg in the evening.  At present, there is no overt CHF on exam.  With her recent stray should've significant pulmonary hypertension, I'm electing to add losartan and will initiate this at a very low dose of just 12.5 mg  for one week and then she will increase this to 25 mg daily.  She is on warfarin for anticoagulation and is not having any bleeding.  Her most recent TSH was normal at 1.33.  She has renal insufficiency with a creatinine of 1.56, and this will need to be monitored closely.  For this reason, I am starting losartan at a very low dose.  Repeat blood work will be obtained in 3 weeks.  She's not having any chest  pain.  She is not aware of any recurrent episodes of atrial fibrillation or atrial flutter.  She continues to be on amiodarone 200 mg daily.  LFTs are normal.  I will see her in 2 months for reevaluation.    Time spent 25 minutes  Troy Sine, MD, [facility]  04/29/2016 5:56 PM

## 2016-05-10 DIAGNOSIS — R630 Anorexia: Secondary | ICD-10-CM | POA: Diagnosis not present

## 2016-05-10 DIAGNOSIS — J209 Acute bronchitis, unspecified: Secondary | ICD-10-CM | POA: Diagnosis not present

## 2016-05-11 DIAGNOSIS — N312 Flaccid neuropathic bladder, not elsewhere classified: Secondary | ICD-10-CM | POA: Diagnosis not present

## 2016-05-17 DIAGNOSIS — N312 Flaccid neuropathic bladder, not elsewhere classified: Secondary | ICD-10-CM | POA: Diagnosis not present

## 2016-05-19 ENCOUNTER — Telehealth: Payer: Self-pay | Admitting: Cardiovascular Disease

## 2016-05-19 MED ORDER — LOSARTAN POTASSIUM 25 MG PO TABS: 25.0000 mg | ORAL_TABLET | Freq: Every day | ORAL | 6 refills | Status: DC

## 2016-05-19 NOTE — Telephone Encounter (Signed)
Returned call to patient she said she read directions wrong on Losartan she took 25 mg 1& 1/2 tablets daily for 1 week now she is taking 25 mg 1 tablet daily.Advised I will send in new prescription.

## 2016-05-19 NOTE — Telephone Encounter (Signed)
Natasha Fletcher is calling because she is being told by the pharmacist that its not time for her to have the medication filled. Its her Losartan Potassium 25mg  . She is not sure if she is following the instructions correctly . Please call    Thanks

## 2016-05-21 ENCOUNTER — Telehealth: Payer: Self-pay | Admitting: *Deleted

## 2016-05-21 NOTE — Telephone Encounter (Signed)
Faxed homehealth certification P[facility] to [facility].

## 2016-05-23 ENCOUNTER — Other Ambulatory Visit: Payer: Self-pay | Admitting: Cardiovascular Disease

## 2016-05-31 ENCOUNTER — Telehealth: Payer: Self-pay | Admitting: *Deleted

## 2016-05-31 ENCOUNTER — Ambulatory Visit (INDEPENDENT_AMBULATORY_CARE_PROVIDER_SITE_OTHER): Payer: Medicare HMO | Admitting: Pharmacist

## 2016-05-31 DIAGNOSIS — I480 Paroxysmal atrial fibrillation: Secondary | ICD-10-CM

## 2016-05-31 DIAGNOSIS — Z7901 Long term (current) use of anticoagulants: Secondary | ICD-10-CM | POA: Diagnosis not present

## 2016-05-31 DIAGNOSIS — I4891 Unspecified atrial fibrillation: Secondary | ICD-10-CM | POA: Diagnosis not present

## 2016-05-31 LAB — POCT INR: INR: 2.8

## 2016-05-31 NOTE — Telephone Encounter (Signed)
Patient in office for warfarin /inr CHECK Patient complain of shortness of breathe when she goes up 6 steps to her kitchen and back down to her sitting area.  No chest pain , no sweating noted , sometimes at night as sweats.  No distress at present time , appointment made sooner with extender 06/04/16, keep appointment with Dr Claiborne Billings 06/23/16 in needed. Patient verbalized understanding

## 2016-06-03 ENCOUNTER — Ambulatory Visit (HOSPITAL_BASED_OUTPATIENT_CLINIC_OR_DEPARTMENT_OTHER): Payer: Medicare HMO | Admitting: Hematology and Oncology

## 2016-06-03 DIAGNOSIS — Z170 Estrogen receptor positive status [ER+]: Secondary | ICD-10-CM

## 2016-06-03 DIAGNOSIS — C50211 Malignant neoplasm of upper-inner quadrant of right female breast: Secondary | ICD-10-CM | POA: Diagnosis not present

## 2016-06-03 DIAGNOSIS — N312 Flaccid neuropathic bladder, not elsewhere classified: Secondary | ICD-10-CM | POA: Diagnosis not present

## 2016-06-03 NOTE — Progress Notes (Signed)
Patient Care Team: Lorene Dy, MD as PCP - General (Internal Medicine)  DIAGNOSIS:  Encounter Diagnosis  Name Primary?  . Malignant neoplasm of upper-inner quadrant of right breast in female, estrogen receptor positive ([place])     SUMMARY OF ONCOLOGIC HISTORY:   Breast cancer of upper-inner quadrant of right female breast ([place])   03/24/2011 Initial Diagnosis    Right breast biopsy: Invasive ductal carcinoma, ER 100%, P 100%, Ki-67 8%, HER-2 negative ratio 1.42      04/27/2011 Surgery    Right breast lumpectomy: 0.9 cm invasive ductal carcinoma grade 1 with low-grade DCIS ER/PR positive HER-2 negative      05/12/2011 -  Anti-estrogen oral therapy    Arimidex 1 mg daily       CHIEF COMPLIANT: Follow-up of Arimidex therapy after completion of 5 years  INTERVAL HISTORY: Natasha Fletcher is a 83 year old with above-mentioned history of right breast cancer treated with lumpectomy and is currently on Arimidex therapy. She has tolerated her to Arimidex extremely well. Did not have any hot flashes or myalgias. Denies any lumps or nodules. She Stopped anastrozole last year after completing 4 years of total therapy with aromatase inhibitors.  REVIEW OF SYSTEMS:   Constitutional: Denies fevers, chills or abnormal weight loss Eyes: Denies blurriness of vision Ears, nose, mouth, throat, and face: Denies mucositis or sore throat Respiratory: Denies cough, dyspnea or wheezes Cardiovascular: Denies palpitation, chest discomfort Gastrointestinal:  Denies nausea, heartburn or change in bowel habits Skin: Denies abnormal skin rashes Lymphatics: Denies new lymphadenopathy or easy bruising Neurological:Denies numbness, tingling or new weaknesses Behavioral/Psych: Mood is stable, no new changes  Extremities: No lower extremity edema Breast:  denies any pain or lumps or nodules in either breasts All other systems were reviewed with the patient and are negative.  I have reviewed the past medical  history, past surgical history, social history and family history with the patient and they are unchanged from previous note.  ALLERGIES:  is allergic to lactose intolerance (gi); milk-related compounds; and sulfa antibiotics.  MEDICATIONS:  Current Outpatient Prescriptions  Medication Sig Dispense Refill  . amiodarone (PACERONE) 200 MG tablet Take 1 tablet (200 mg total) by mouth daily. 30 tablet 11  . anastrozole (ARIMIDEX) 1 MG tablet TAKE 1 TABLET BY MOUTH EVERY DAY 30 tablet 2  . carvedilol (COREG) 12.5 MG tablet Take 1 tablet (12.5 mg total) by mouth 2 (two) times daily. 60 tablet 9  . furosemide (LASIX) 20 MG tablet Take 2 tablets (40 mg total) by mouth every morning and take 1 tablet (20 mg total) by mouth every evening. (Patient taking differently: Take 2 tablets (40 mg total) by mouth every morning and take 2 tablets (40 mg total) by mouth every evening.) 270 tablet 3  . HYDROcodone-acetaminophen (NORCO) 10-325 MG tablet Take 1 tablet by mouth 2 (two) times daily as needed.  0  . latanoprost (XALATAN) 0.005 % ophthalmic solution Place 1 drop into both eyes at bedtime.    Marland Kitchen losartan (COZAAR) 25 MG tablet Take 1 tablet (25 mg total) by mouth daily. 30 tablet 6  . Protein (PROCEL 100 PO) Take 1 scoop by mouth 2 (two) times daily.    . traZODone (DESYREL) 50 MG tablet Take 50 mg by mouth at bedtime as needed.   0  . UNABLE TO FIND Med Name: MedPass 120 mL po TID between meals for supplement and nutritional support    . warfarin (COUMADIN) 4 MG tablet Take 1/2 to 1 tablet  by mouth daily as directed by coumadin clinic 30 tablet 2   No current facility-administered medications for this visit.     PHYSICAL EXAMINATION: ECOG PERFORMANCE STATUS: 1 - Symptomatic but completely ambulatory  Vitals:   06/03/16 1046  BP: (!) 118/44  Pulse: 60  Resp: 18  Temp: 97.9 F (36.6 C)   Filed Weights   06/03/16 1046  Weight: 150 lb 12.8 oz (68.4 kg)    GENERAL:alert, no distress and  comfortable SKIN: skin color, texture, turgor are normal, no rashes or significant lesions EYES: normal, Conjunctiva are pink and non-injected, sclera clear OROPHARYNX:no exudate, no erythema and lips, buccal mucosa, and tongue normal  NECK: supple, thyroid normal size, non-tender, without nodularity LYMPH:  no palpable lymphadenopathy in the cervical, axillary or inguinal LUNGS: clear to auscultation and percussion with normal breathing effort HEART: regular rate & rhythm and no murmurs and no lower extremity edema ABDOMEN:abdomen soft, non-tender and normal bowel sounds MUSCULOSKELETAL:no cyanosis of digits and no clubbing  NEURO: alert & oriented x 3 with fluent speech, no focal motor/sensory deficits EXTREMITIES: No lower extremity edema BREAST: No palpable masses or nodules in either right or left breasts. No palpable axillary supraclavicular or infraclavicular adenopathy no breast tenderness or nipple discharge. (exam performed in the presence of a chaperone)  LABORATORY DATA:  I have reviewed the data as listed   Chemistry      Component Value Date/Time   NA 139 03/22/2016 1015   NA 139 08/27/2015   NA 140 06/03/2015 0825   K 4.2 03/22/2016 1015   K 3.9 06/03/2015 0825   CL 101 03/22/2016 1015   CL 106 06/30/2012 1408   CO2 30 03/22/2016 1015   CO2 24 06/03/2015 0825   BUN 30 (H) 03/22/2016 1015   BUN 43 (A) 08/27/2015   BUN 29.1 (H) 06/03/2015 0825   CREATININE 1.56 (H) 03/22/2016 1015   CREATININE 1.2 (H) 06/03/2015 0825   GLU 83 08/27/2015      Component Value Date/Time   CALCIUM 9.4 03/22/2016 1015   CALCIUM 9.5 06/03/2015 0825   ALKPHOS 87 03/22/2016 1015   ALKPHOS 100 06/03/2015 0825   AST 24 03/22/2016 1015   AST 38 (H) 06/03/2015 0825   ALT 13 03/22/2016 1015   ALT 30 06/03/2015 0825   BILITOT 0.6 03/22/2016 1015   BILITOT 1.01 06/03/2015 0825       Lab Results  Component Value Date   WBC 4.6 03/22/2016   HGB 10.4 (L) 03/22/2016   HCT 34.1 (L)  03/22/2016   MCV 90.2 03/22/2016   PLT 233 03/22/2016   NEUTROABS 3,404 03/22/2016    ASSESSMENT & PLAN:  Breast cancer of upper-inner quadrant of right female breast Right breast lumpectomy for a 0.9 cm ER positive PR positive HER-2/neu negative invasive ductal carcinoma of the right breast on 04/27/2011   Current treatment: Arimidex 1 mg daily From February 2013 to March 2017 Breast cancer surveillance:  1. Mammogram 11/24/2015 is normal, patient will need another mammogram this year if she wishes to continue with surveillance. Breast density category C 2. Breast exam 06/03/2016 is normal  Return to clinic in 1 year for follow-up with survivorship clinic   I spent 25 minutes talking to the patient of which more than half was spent in counseling and coordination of care.  No orders of the defined types were placed in this encounter.  The patient has a good understanding of the overall plan. she agrees with it. she will  call with any problems that may develop before the next visit here.   Rulon Eisenmenger, MD 06/03/16

## 2016-06-03 NOTE — Assessment & Plan Note (Signed)
Right breast lumpectomy for a 0.9 cm ER positive PR positive HER-2/neu negative invasive ductal carcinoma of the right breast on 04/27/2011   Current treatment: Arimidex 1 mg daily since February 2013 with the plan for 5 years  Arimidex toxicities: No significant problems. Patient completed 5 years of therapy. She denies any hot flashes or myalgias. I recommended that she discontinue Arimidex therapy at this time.  Breast cancer surveillance:  1. Mammogram 11/24/2015 is normal, patient will need another mammogram this year if she wishes to continue with surveillance. Breast density category C 2. Breast exam 06/03/2016 is normal  Return to clinic in 1 year for follow-up with survivorship clinic

## 2016-06-04 ENCOUNTER — Ambulatory Visit: Payer: Medicare HMO | Admitting: Physician Assistant

## 2016-06-23 ENCOUNTER — Encounter: Payer: Self-pay | Admitting: Cardiovascular Disease

## 2016-06-23 ENCOUNTER — Ambulatory Visit (INDEPENDENT_AMBULATORY_CARE_PROVIDER_SITE_OTHER): Payer: Medicare HMO | Admitting: Cardiovascular Disease

## 2016-06-23 ENCOUNTER — Ambulatory Visit (INDEPENDENT_AMBULATORY_CARE_PROVIDER_SITE_OTHER): Payer: Medicare HMO | Admitting: Pharmacist Clinician (PhC)/ Clinical Pharmacy Specialist

## 2016-06-23 VITALS — BP 114/61 | HR 60 | Ht 64.0 in | Wt 149.2 lb

## 2016-06-23 DIAGNOSIS — Z79899 Other long term (current) drug therapy: Secondary | ICD-10-CM | POA: Diagnosis not present

## 2016-06-23 DIAGNOSIS — R0609 Other forms of dyspnea: Secondary | ICD-10-CM | POA: Diagnosis not present

## 2016-06-23 DIAGNOSIS — I4891 Unspecified atrial fibrillation: Secondary | ICD-10-CM

## 2016-06-23 DIAGNOSIS — I119 Hypertensive heart disease without heart failure: Secondary | ICD-10-CM | POA: Diagnosis not present

## 2016-06-23 DIAGNOSIS — R0600 Dyspnea, unspecified: Secondary | ICD-10-CM

## 2016-06-23 DIAGNOSIS — E079 Disorder of thyroid, unspecified: Secondary | ICD-10-CM

## 2016-06-23 DIAGNOSIS — I480 Paroxysmal atrial fibrillation: Secondary | ICD-10-CM

## 2016-06-23 DIAGNOSIS — Z7901 Long term (current) use of anticoagulants: Secondary | ICD-10-CM

## 2016-06-23 DIAGNOSIS — I10 Essential (primary) hypertension: Secondary | ICD-10-CM

## 2016-06-23 LAB — BASIC METABOLIC PANEL
BUN: 37 mg/dL — AB (ref 7–25)
CALCIUM: 9.3 mg/dL (ref 8.6–10.4)
CO2: 24 mmol/L (ref 20–31)
Chloride: 105 mmol/L (ref 98–110)
Creat: 1.7 mg/dL — ABNORMAL HIGH (ref 0.60–0.88)
Glucose, Bld: 93 mg/dL (ref 65–99)
Potassium: 4.6 mmol/L (ref 3.5–5.3)
Sodium: 142 mmol/L (ref 135–146)

## 2016-06-23 LAB — POCT INR: INR: 4.7

## 2016-06-23 NOTE — Patient Instructions (Addendum)
Your physician recommends that you return for lab work today.  Your physician recommends that you schedule a follow-up appointment with Dr Shonna Chock.   Your physician wants you to follow-up in: 6 months or sooner if needed. You will receive a reminder letter in the mail two months in advance. If you don't receive a letter, please call our office to schedule the follow-up appointment.   If you need a refill on your cardiac medications before your next appointment, please call your pharmacy.

## 2016-06-23 NOTE — Progress Notes (Signed)
Patient ID: Natasha Fletcher, female   DOB: Nov 22, 1925, 83 y.o.   MRN: [id number]     HPI: Natasha Fletcher is a 83 y.o. female who presents to the office today for a 2 month follow-up cardiology evaluation.    Natasha Fletcher is status post right total knee replacement in July 2015 and developed postoperative anemia, urinary retention and dehydration.  She underwent red cell transfusion.  In August 2015, she developed acute renal failure was omitted to the emergency room with a creatinine of 7.2, potassium 6.2.  She was hydrated, her ACE inhibitor inhibitor was discontinued and a Foley was placed.  She subsequently developed an episode of presyncope and was found to be in atrial fibrillation with RVR leading to her readmission.  Several days later.  Cardiology consultation was obtained in the hospital on 11/11/2013.  An echo revealed ejection fraction of 65-70% with grade 2 diastolic dysfunction and there was evidence for an intracavity gradient of 16 mm.  She was on anticoagulation and ultimately underwent cardioversion in October 2015.  She was in normal sinus rhythm.  On subsequent follow-up in November 2015.  She saw Kerin Ransom in March 2016.  When I saw  her in October 2016 her ECG showed atrial flutter with 4:1 conduction.  I recommended an echo Doppler study which showed an EF of 55-60% and evidence for severe LVH.  There was moderate left atrial and mild biatrial dilatation.  I was to see her back in follow-up but I have not seen her since. Since I saw her in December 2016 she was admitted to [facility] hospital with a UTI and was in atrial fibrillation. Cardizem and metoprolol were increased, although she developed significant bradycardia and hypotension leading to subsequent dose reduction. In early February.  She was found to be in atrial fibrillation with rapid ventricular response.  Since her hospitalization, she has been seen by several APPs. She also really was referred for a cardioversion for atrial  fibrillation which was done on 06/04/2015 by Dr. Meda Coffee. She presents now for follow-up evaluation.  She underwent an Medtronic permanent pacemaker implantation for sick sinus syndrome in May 2017.  She has been on chronic anticoagulation with warfarin.  She has had issues with shortness of breath and lower extremity edema necessitating increasing diuretic therapy.   She underwent an echo Doppler study on 04/23/2016 which showed severe LVH with normal systolic function with an EF of 60-65%.  There was grade 2 diastolic dysfunction.  Was moderate tricuspid regurgitation.  She had severe PA pressure increased at 69 mmHg.    I last saw her, she denies any chest pain.  She notes shortness of breath with step climbing.  Foley catheter in place.  Her edema has resolved and she has been taking furosemide oh grams twice a day.  She has been maintained on losartan 25 mg daily, carvedilol 12.5 mg twice a day, and amiodarone 2 mg daily.  She is followed by Dr. Curt Bears for EP.  She has been on Coumadin.  She presents for evaluation.   Past Medical History:  Diagnosis Date  . Arthritis    "all over"  . Asthma   . Atrial fibrillation ([place])    a. s/p DCCV in 01/2014 b. repeat DCCV in 06/2015, started on Amiodarone at that time. On Coumadin for anticoagulation.  . Cancer ([place])    rt breast  . Chronic diastolic CHF (congestive heart failure) ([place])    a. 08/2015: Echo with EF of 60-65%, no  WMA, LA mod dilated, mild TR.  . Diabetes mellitus without complication ([place])   . Edema leg   . Fast breathing    "because of pill"  . GERD (gastroesophageal reflux disease)   . Glaucoma   . Glaucoma   . Hypertension   . Incontinence   . Pneumonia    h/o younger  . S/P total knee arthroplasty   . SSS (sick sinus syndrome) ([place])    a. s/p Medtronic Advisa DR MRI [id number] (serial number [id number]) PPM placement in 08/2015  . Swelling    bilateral feet/ legs, more left.  . Thyroid disease    "something years  ago"  . UTI (urinary tract infection) 02/12/2015  . Wears dentures   . Wears glasses     Past Surgical History:  Procedure Laterality Date  . ABDOMINAL HYSTERECTOMY    . APPENDECTOMY    . BREAST BIOPSY  04/27/2011   Procedure: BREAST BIOPSY WITH NEEDLE LOCALIZATION;  Surgeon: Edward Jolly, MD;  Location: [facility];  Service: General;  Laterality: Right;  right needle localized breast lumpectomy   . BREAST LUMPECTOMY  04/27/11   right breast by [place]  . CARDIOVERSION N/A 02/01/2014   Procedure: CARDIOVERSION;  Surgeon: Candee Furbish, MD;  Location: [facility] ENDOSCOPY;  Service: Cardiovascular;  Laterality: N/A;  . CARDIOVERSION N/A 06/04/2015   Procedure: CARDIOVERSION;  Surgeon: Dorothy Spark, MD;  Location: [place];  Service: Cardiovascular;  Laterality: N/A;  . CARDIOVERSION N/A 07/14/2015   Procedure: CARDIOVERSION;  Surgeon: Thayer Headings, MD;  Location: [facility] ENDOSCOPY;  Service: Cardiovascular;  Laterality: N/A;  . CARPAL TUNNEL RELEASE Bilateral   . COLONOSCOPY    . EP IMPLANTABLE DEVICE N/A 08/22/2015   Procedure: Pacemaker Implant;  Surgeon: Will Meredith Leeds, MD;  Medtronic Advisa DR MRI [id number] (serial number [id number]);  Laterality: N/A;  . EYE SURGERY     both cataracts  . SHOULDER SURGERY Left   . TEE WITHOUT CARDIOVERSION N/A 02/01/2014   Procedure: TRANSESOPHAGEAL ECHOCARDIOGRAM (TEE);  Surgeon: Candee Furbish, MD;  Location[facility]COPY;  Service: Cardiovascular;  Laterality: N/A;  . TONSILLECTOMY    . TOTAL HIP ARTHROPLASTY Bilateral   . TOTAL KNEE ARTHROPLASTY Right 02/19/2013   Procedure: RIGHT TOTAL KNEE ARTHROPLASTY;  Surgeon: Gearlean Alf, MD;  Location: [facility] ORS;  Service: Orthopedics;  Laterality: Right;  . TOTAL KNEE ARTHROPLASTY Left 10/15/2013   Procedure: LEFT TOTAL KNEE ARTHROPLASTY;  Surgeon: Gearlean Alf, MD;  Location: [facility] ORS;  Service: Orthopedics;  Laterality: Left;    Allergies  Allergen Reactions  . Lactose  Intolerance (Gi) Other (See Comments)    Stomach pain  . Milk-Related Compounds Other (See Comments)    Upset stomach   . Sulfa Antibiotics Itching    Current Outpatient Prescriptions  Medication Sig Dispense Refill  . amiodarone (PACERONE) 200 MG tablet Take 1 tablet (200 mg total) by mouth daily. 30 tablet 11  . anastrozole (ARIMIDEX) 1 MG tablet TAKE 1 TABLET BY MOUTH EVERY DAY 30 tablet 2  . carvedilol (COREG) 12.5 MG tablet Take 1 tablet (12.5 mg total) by mouth 2 (two) times daily. 60 tablet 9  . furosemide (LASIX) 20 MG tablet Take 40 mg by mouth 2 (two) times daily.    Marland Kitchen HYDROcodone-acetaminophen (NORCO) 10-325 MG tablet Take 1 tablet by mouth 2 (two) times daily as needed.  0  . latanoprost (XALATAN) 0.005 % ophthalmic solution Place 1 drop into both eyes  at bedtime.    Marland Kitchen losartan (COZAAR) 25 MG tablet Take 1 tablet (25 mg total) by mouth daily. 30 tablet 6  . Protein (PROCEL 100 PO) Take 1 scoop by mouth 2 (two) times daily.    . traZODone (DESYREL) 50 MG tablet Take 50 mg by mouth at bedtime as needed.   0  . UNABLE TO FIND Med Name: MedPass 120 mL po TID between meals for supplement and nutritional support    . warfarin (COUMADIN) 4 MG tablet Take 1/2 to 1 tablet by mouth daily as directed by coumadin clinic 30 tablet 2   No current facility-administered medications for this visit.     Social History   Social History  . Marital status: Single    Spouse name: N/A  . Number of children: N/A  . Years of education: N/A   Occupational History  . Not on file.   Social History Main Topics  . Smoking status: Former Smoker    Packs/day: 0.00    Years: 30.00    Types: Cigarettes    Quit date: 04/21/1975  . Smokeless tobacco: Never Used  . Alcohol use Yes     Comment: occasionally  . Drug use: No  . Sexual activity: Not Currently   Other Topics Concern  . Not on file   Social History Narrative   From SNF, getting PT and using cane.      Family History  Problem  Relation Age of Onset  . Heart disease Mother   . Stroke Mother   . Hypertension Mother   . Stroke Father   . Hypertension Father   . Stroke Brother   . Heart attack Neg Hx     ROS General: Negative; No fevers, chills, or night sweats HEENT: Negative; No changes in vision or hearing, sinus congestion, difficulty swallowing Pulmonary: Negative; No cough, wheezing, shortness of breath, hemoptysis Cardiovascular: See HPI:  GI: Negative; No nausea, vomiting, diarrhea, or abdominal pain GU: Negative; No dysuria, hematuria, or difficulty voiding Musculoskeletal: Negative; no myalgias, joint pain, or weakness Hematologic: Negative; no easy bruising, bleeding Endocrine: Negative; no heat/cold intolerance; no diabetes, Neuro: Negative; no changes in balance, headaches Skin: Negative; No rashes or skin lesions Psychiatric: Negative; No behavioral problems, depression Sleep: Negative; No snoring,  daytime sleepiness, hypersomnolence, bruxism, restless legs, hypnogognic hallucinations. Other comprehensive 14 point system review is negative   Physical Exam BP 114/61   Pulse 60   Ht 5' 4"  (1.626 m)   Wt 149 lb 3.2 oz (67.7 kg)   BMI 25.61 kg/m    Repeat blood pressure by me 120/70  Wt Readings from Last 3 Encounters:  06/23/16 149 lb 3.2 oz (67.7 kg)  06/03/16 150 lb 12.8 oz (68.4 kg)  04/27/16 148 lb 3.2 oz (67.2 kg)   General: Alert, oriented, no distress.  Skin: normal turgor, no rashes, warm and dry HEENT: Normocephalic, atraumatic.Bilateral arcus senilis.  Pupils equal round and reactive to light; sclera anicteric; extraocular muscles intact, No lid lag; Nose without nasal septal hypertrophy; Mouth/Parynx benign; Mallinpatti scale 3 Neck: No JVD, no carotid bruits; normal carotid upstroke Lungs: clear to ausculatation and percussion bilaterally; no wheezing or rales, normal inspiratory and expiratory effort Chest wall: without tenderness to palpitation Heart: Regular rate and  rhythm.  1/6 systolic murmur, No diastolic murmur, no rubs, gallops, thrills, or heaves Abdomen: soft, nontender; no hepatosplenomehaly, BS+; abdominal aorta nontender and not dilated by palpation. Back: no CVA tenderness Pulses: 2+  Musculoskeletal: full range of  motion, normal strength, no joint deformities Extremities: Pulses 2+, mild lower extremity edema., Homan's sign negative  Neurologic: grossly nonfocal; Cranial nerves grossly wnl Psychologic: Normal mood and affect  ECG (independently read by me): Atrially paced rhythm at 60 bpm.  Prolonged AV conduction with PR interval 286 ms.  Nonspecific ST-T changes.  QTc interval increased at 522 ms.  January 2018 ECG (independently read by me): Atrially paced rhythm at 60 bpm.  Prolonged AV conduction with a PR interval at 270 ms.  Nonspecific ST-T changes.  March 2017 ECG (independently read by me): Atrial flutter with a ventricular rate at 87 bpm with variable block.  Nonspecific ST-T changes.  My personal review of her March 2016 ECG shows sinus bradycardia at 58  LABS:  BMP Latest Ref Rng & Units 06/23/2016 03/22/2016 03/08/2016  Glucose 65 - 99 mg/dL 93 89 77  BUN 7 - 25 mg/dL 37(H) 30(H) 38(H)  Creatinine 0.60 - 0.88 mg/dL 1.70(H) 1.56(H) 1.58(H)  Sodium 135 - 146 mmol/L 142 139 140  Potassium 3.5 - 5.3 mmol/L 4.6 4.2 4.2  Chloride 98 - 110 mmol/L 105 101 104  CO2 20 - 31 mmol/L 24 30 30   Calcium 8.6 - 10.4 mg/dL 9.3 9.4 9.2    Hepatic Function Latest Ref Rng & Units 03/22/2016 08/27/2015 06/03/2015  Total Protein 6.1 - 8.1 g/dL 6.9 - 6.7  Albumin 3.6 - 5.1 g/dL 3.9 - 3.6  AST 10 - 35 U/L 24 30 38(H)  ALT 6 - 29 U/L 13 12 30   Alk Phosphatase 33 - 130 U/L 87 119 100  Total Bilirubin 0.2 - 1.2 mg/dL 0.6 - 1.01    CBC Latest Ref Rng & Units 03/22/2016 08/27/2015 08/23/2015  WBC 3.8 - 10.8 K/uL 4.6 4.8 6.4  Hemoglobin 11.7 - 15.5 g/dL 10.4(L) 11.1(A) 10.4(L)  Hematocrit 35.0 - 45.0 % 34.1(L) 36 33.3(L)  Platelets 140 - 400 K/uL  233 197 224   Lab Results  Component Value Date   MCV 90.2 03/22/2016   MCV 86.5 08/23/2015   MCV 85.2 08/22/2015    Lab Results  Component Value Date   TSH 1.33 03/22/2016    BNP    Component Value Date/Time   BNP 386.4 (H) 03/08/2016 1024    ProBNP    Component Value Date/Time   PROBNP 3,143.0 (H) 11/17/2013 0442    Lipid Panel  No results found for: CHOL, TRIG, HDL, CHOLHDL, VLDL, LDLCALC, LDLDIRECT  INR today 4.7   RADIOLOGY: No results found.  IMPRESSION:  1. Hypertensive heart disease without heart failure   2. PAF (paroxysmal atrial fibrillation) ([place])   3. Benign essential HTN   4. Thyroid disease   5. Long-term (current) use of anticoagulants   6. Medication management   7. Exertional dyspnea     ASSESSMENT AND PLAN: Natasha Fletcher is an 83 year old African-American female who has a history of atrial flutter in PAF and underwent insertion of a permanent pacemaker for sick sinus syndrome in May 2017.  She has a history of hypertension, chronic diastolic heart failure and on her most recent echo Doppler study has significant pulmonary artery pressure elevation.  She admits to shortness of breath most prominent when she walks up steps..  Her ECG today shows atrially paced rhythm with prolonged AV conduction.  Her blood pressure is controlled on her current medical regimen consisting of 12.5 mg twice a day and she has been taking furosemide 40 mg bid.  At present, there is no overt CHF on  exam.  I recommended that she can reduce her furosemide and take 40 mg in the morning and 20 mg in the afternoon.  I last saw her, I added losartan in light of her pulmonary hypertension.  In our today is supratherapeutic at 4.7 and she was advised to hold Coumadin for 2 days and her Coumadin dose was adjusted.  He is followed by Dr. Shonna Chock for her pacemaker and EP.  She has stage III chronic kidney disease.  Her last creatinine was 1.56.  I am recommending repeat lab work to be done  today.  I will arrange a follow-up appointment with Dr. Curt Bears and I will see her in 6 months. Time spent 25 minutes  Troy Sine, MD, [facility]  06/25/2016 8:13 PM

## 2016-06-30 ENCOUNTER — Encounter: Payer: Self-pay | Admitting: Cardiology

## 2016-07-08 DIAGNOSIS — N312 Flaccid neuropathic bladder, not elsewhere classified: Secondary | ICD-10-CM | POA: Diagnosis not present

## 2016-07-09 ENCOUNTER — Telehealth: Payer: Self-pay | Admitting: Cardiovascular Disease

## 2016-07-09 NOTE — Telephone Encounter (Signed)
New Message  Pt voiced she would like for a nurse to give her a call.  Please f/u

## 2016-07-09 NOTE — Telephone Encounter (Signed)
Returned call to patient 06/29/16 bmet results given.

## 2016-07-13 ENCOUNTER — Ambulatory Visit: Payer: Medicare HMO | Admitting: Cardiology

## 2016-07-13 ENCOUNTER — Telehealth: Payer: Self-pay | Admitting: Cardiology

## 2016-07-13 NOTE — Telephone Encounter (Signed)
Patient was calling to change her appointment with Dr. Curt Bears to next week. Patient also complaining about having SOB for 2 weeks. Patient denies having any dizziness or chest pain. Patient stated she has been pretty active until two weeks ago. Patient stated she feels fine when she is resting. Patient stated she just does not have any energy this week to go any where. Patient changed her coumadin appointment also. Informed patient that she was suppose to go to coumadin clinic last week, but she had cancelled. Patient also cancelled her appointment for this week. Encouraged patient that she needed to have her coumadin checked this week, since her INR was 4.7 and she is past due for a follow-up. Patient stated she could come on Friday. Patient wanted to make appointment at [facility] to coincide with Dr. Curt Bears appointment and it's closer to her house. Patient will come in on Friday to have coumadin check. Will forward to Dr. Claiborne Billings so he is aware of patient's recent SOB and low energy.

## 2016-07-13 NOTE — Telephone Encounter (Signed)
Pt c/o Shortness Of Breath: STAT if SOB developed within the last 24 hours or pt is noticeably SOB on the phone  1. Are you currently SOB (can you hear that pt is SOB on the phone)? yes 2. How long have you been experiencing SOB? always  3. Are you SOB when sitting or when up moving around? Moving around  4. Are you currently experiencing any other symptoms? no

## 2016-07-14 ENCOUNTER — Encounter: Payer: Medicare HMO | Admitting: Cardiology

## 2016-07-16 ENCOUNTER — Ambulatory Visit (INDEPENDENT_AMBULATORY_CARE_PROVIDER_SITE_OTHER): Payer: Medicare HMO

## 2016-07-16 DIAGNOSIS — Z7901 Long term (current) use of anticoagulants: Secondary | ICD-10-CM | POA: Diagnosis not present

## 2016-07-16 DIAGNOSIS — I4891 Unspecified atrial fibrillation: Secondary | ICD-10-CM | POA: Diagnosis not present

## 2016-07-16 DIAGNOSIS — I480 Paroxysmal atrial fibrillation: Secondary | ICD-10-CM | POA: Diagnosis not present

## 2016-07-16 LAB — POCT INR: INR: 3.9

## 2016-07-16 NOTE — Telephone Encounter (Signed)
acknowledged

## 2016-07-20 ENCOUNTER — Emergency Department (HOSPITAL_COMMUNITY)
Admission: EM | Admit: 2016-07-20 | Discharge: 2016-07-20 | Disposition: A | Discharge status: Home / Self Care | Payer: Medicare HMO | Attending: Emergency Medicine | Admitting: Emergency Medicine

## 2016-07-20 ENCOUNTER — Encounter (HOSPITAL_COMMUNITY): Payer: Self-pay | Admitting: Emergency Medicine

## 2016-07-20 DIAGNOSIS — Z7901 Long term (current) use of anticoagulants: Secondary | ICD-10-CM | POA: Insufficient documentation

## 2016-07-20 DIAGNOSIS — J45909 Unspecified asthma, uncomplicated: Secondary | ICD-10-CM | POA: Insufficient documentation

## 2016-07-20 DIAGNOSIS — Z853 Personal history of malignant neoplasm of breast: Secondary | ICD-10-CM | POA: Diagnosis not present

## 2016-07-20 DIAGNOSIS — T83098A Other mechanical complication of other indwelling urethral catheter, initial encounter: Secondary | ICD-10-CM | POA: Diagnosis not present

## 2016-07-20 DIAGNOSIS — R338 Other retention of urine: Secondary | ICD-10-CM

## 2016-07-20 DIAGNOSIS — E119 Type 2 diabetes mellitus without complications: Secondary | ICD-10-CM | POA: Diagnosis not present

## 2016-07-20 DIAGNOSIS — I110 Hypertensive heart disease with heart failure: Secondary | ICD-10-CM | POA: Diagnosis not present

## 2016-07-20 DIAGNOSIS — I5032 Chronic diastolic (congestive) heart failure: Secondary | ICD-10-CM | POA: Insufficient documentation

## 2016-07-20 DIAGNOSIS — N390 Urinary tract infection, site not specified: Secondary | ICD-10-CM | POA: Diagnosis not present

## 2016-07-20 DIAGNOSIS — Z87891 Personal history of nicotine dependence: Secondary | ICD-10-CM | POA: Insufficient documentation

## 2016-07-20 DIAGNOSIS — Y828 Other medical devices associated with adverse incidents: Secondary | ICD-10-CM | POA: Diagnosis not present

## 2016-07-20 DIAGNOSIS — R339 Retention of urine, unspecified: Secondary | ICD-10-CM | POA: Insufficient documentation

## 2016-07-20 DIAGNOSIS — T83091A Other mechanical complication of indwelling urethral catheter, initial encounter: Secondary | ICD-10-CM

## 2016-07-20 DIAGNOSIS — Z96659 Presence of unspecified artificial knee joint: Secondary | ICD-10-CM | POA: Insufficient documentation

## 2016-07-20 LAB — URINALYSIS, ROUTINE W REFLEX MICROSCOPIC
Bilirubin Urine: NEGATIVE
Glucose, UA: NEGATIVE mg/dL
KETONES UR: NEGATIVE mg/dL
Nitrite: NEGATIVE
Protein, ur: 30 mg/dL — AB
Specific Gravity, Urine: 1.013 (ref 1.005–1.030)
pH: 7 (ref 5.0–8.0)

## 2016-07-20 LAB — BASIC METABOLIC PANEL
Anion gap: 9 (ref 5–15)
BUN: 27 mg/dL — AB (ref 6–20)
CHLORIDE: 104 mmol/L (ref 101–111)
CO2: 25 mmol/L (ref 22–32)
Calcium: 9 mg/dL (ref 8.9–10.3)
Creatinine, Ser: 1.68 mg/dL — ABNORMAL HIGH (ref 0.44–1.00)
GFR calc Af Amer: 30 mL/min — ABNORMAL LOW (ref >60)
GFR calc non Af Amer: 26 mL/min — ABNORMAL LOW (ref >60)
Glucose, Bld: 115 mg/dL — ABNORMAL HIGH (ref 65–99)
POTASSIUM: 4.3 mmol/L (ref 3.5–5.1)
Sodium: 138 mmol/L (ref 135–145)

## 2016-07-20 LAB — PROTIME-INR
INR: 2.91
Prothrombin Time: 31 seconds — ABNORMAL HIGH (ref 11.4–15.2)

## 2016-07-20 LAB — CBC
HCT: 31 % — ABNORMAL LOW (ref 36.0–46.0)
HEMOGLOBIN: 9.5 g/dL — AB (ref 12.0–15.0)
MCH: 25.8 pg — ABNORMAL LOW (ref 26.0–34.0)
MCHC: 30.6 g/dL (ref 30.0–36.0)
MCV: 84.2 fL (ref 78.0–100.0)
PLATELETS: 221 10*3/uL (ref 150–400)
RBC: 3.68 MIL/uL — ABNORMAL LOW (ref 3.87–5.11)
RDW: 15 % (ref 11.5–15.5)
WBC: 7 10*3/uL (ref 4.0–10.5)

## 2016-07-20 MED ORDER — CIPROFLOXACIN HCL 500 MG PO TABS
500.0000 mg | ORAL_TABLET | Freq: Once | ORAL | Status: AC
  Administered 2016-07-20: 500 mg via ORAL
  Filled 2016-07-20: qty 1

## 2016-07-20 MED ORDER — CIPROFLOXACIN HCL 500 MG PO TABS: 500.0000 mg | ORAL_TABLET | Freq: Two times a day (BID) | ORAL | 0 refills | Status: DC

## 2016-07-20 NOTE — Progress Notes (Signed)
Electrophysiology Office Note   Date:  07/22/2016   ID:  Natasha Fletcher, DOB 1926/02/20, MRN [id number]  PCP:  Myriam Jacobson, MD  Cardiologist:  Claiborne Billings Primary Electrophysiologist:  Obryan Radu Meredith Leeds, MD    Chief Complaint  Patient presents with  . Pacemaker Check    Tachy-Brady syndrome/PAF  . Shortness of Breath     History of Present Illness: Natasha Fletcher is a 83 y.o. female who presents today for electrophysiology evaluation.   She was diagnosed with atrial fibrillation with RVR and 2015 when she was hospitalized. She was found to have an EF of 65-70% with grade 2 diastolic dysfunction and an intracavitary gradient of 16 mmHg. She was anticoagulated and was cardioverted in October 2015. In October 2016, she was found to be in atrial flutter with 4-1 conduction. She had an echocardiogram at that time showed an EF 55-60% and severe LVH. She had moderate biatrial dilation. She was admitted to [facility] December 2016 she was found to be in atrial fibrillation. She was referred for cardioversion on 06/04/15, and is subsequently presented back to her cardiologist clinic in atrial flutter with 4-1 conduction. Had pacemaker placed 08/22/15.  Today, she continues to have episodes of shortness of breath. She says that her shortness of breath is worse when she climbs stairs, or walks around her house very much. Interrogation of her pacemaker shows that her rate response is not on and histograms are relatively flat. She is also asking about water aerobics as she wishes to do more exercise. She otherwise has been feeling well without episodes of palpitations.  Past Medical History:  Diagnosis Date  . Arthritis    "all over"  . Asthma   . Atrial fibrillation ([place])    a. s/p DCCV in 01/2014 b. repeat DCCV in 06/2015, started on Amiodarone at that time. On Coumadin for anticoagulation.  . Cancer ([place])    rt breast  . Chronic diastolic CHF (congestive heart failure) ([place])    a. 08/2015: Echo with EF of 60-65%, no WMA, LA mod dilated, mild TR.  . Diabetes mellitus without complication ([place])   . Edema leg   . Fast breathing    "because of pill"  . GERD (gastroesophageal reflux disease)   . Glaucoma   . Glaucoma   . Hypertension   . Incontinence   . Pneumonia    h/o younger  . S/P total knee arthroplasty   . SSS (sick sinus syndrome) ([place])    a. s/p Medtronic Advisa DR MRI A2DR01 [id number] (serial number [id number]) PPM placement in 08/2015  . Swelling    bilateral feet/ legs, more left.  . Thyroid disease    "something years ago"  . UTI (urinary tract infection) 02/12/2015  . Wears dentures   . Wears glasses    Past Surgical History:  Procedure Laterality Date  . ABDOMINAL HYSTERECTOMY    . APPENDECTOMY    . BREAST BIOPSY  04/27/2011   Procedure: BREAST BIOPSY WITH NEEDLE LOCALIZATION;  Surgeon: Edward Jolly, MD;  Location: [place];  Service: General;  Laterality: Right;  right needle localized breast lumpectomy   . BREAST LUMPECTOMY  04/27/11   right breast by [place]  . CARDIOVERSION N/A 02/01/2014   Procedure: CARDIOVERSION;  Surgeon: Candee Furbish, MD;  Location: [facility] ENDOSCOPY;  Service: Cardiovascular;  Laterality: N/A;  . CARDIOVERSION N/A 06/04/2015   Procedure: CARDIOVERSION;  Surgeon: Dorothy Spark, MD;  Location: [place];  Service: Cardiovascular;  Laterality: N/A;  . CARDIOVERSION N/A 07/14/2015   Procedure: CARDIOVERSION;  Surgeon: Thayer Headings, MD;  Location: [facility] ENDOSCOPY;  Service: Cardiovascular;  Laterality: N/A;  . CARPAL TUNNEL RELEASE Bilateral   . COLONOSCOPY    . EP IMPLANTABLE DEVICE N/A 08/22/2015   Procedure: Pacemaker Implant;  Surgeon: Audrea Bolte Meredith Leeds, MD;  Medtronic Advisa DR MRI [phone] [id number] (serial number [id number]);  Laterality: N/A;  . EYE SURGERY     both cataracts  . SHOULDER SURGERY Left   . TEE WITHOUT CARDIOVERSION N/A 02/01/2014   Procedure: TRANSESOPHAGEAL ECHOCARDIOGRAM (TEE);   Surgeon: Candee Furbish, MD;  Location: [facility] ENDOSCOPY;  Service: Cardiovascular;  Laterality: N/A;  . TONSILLECTOMY    . TOTAL HIP ARTHROPLASTY Bilateral   . TOTAL KNEE ARTHROPLASTY Right 02/19/2013   Procedure: RIGHT TOTAL KNEE ARTHROPLASTY;  Surgeon: Gearlean Alf, MD;  Location: [facility] ORS;  Service: Orthopedics;  Laterality: Right;  . TOTAL KNEE ARTHROPLASTY Left 10/15/2013   Procedure: LEFT TOTAL KNEE ARTHROPLASTY;  Surgeon: Gearlean Alf, MD;  Location: [facility] ORS;  Service: Orthopedics;  Laterality: Left;     Current Outpatient Prescriptions  Medication Sig Dispense Refill  . amiodarone (PACERONE) 200 MG tablet Take 1 tablet (200 mg total) by mouth daily. 30 tablet 11  . anastrozole (ARIMIDEX) 1 MG tablet TAKE 1 TABLET BY MOUTH EVERY DAY 30 tablet 2  . carvedilol (COREG) 12.5 MG tablet Take 1 tablet (12.5 mg total) by mouth 2 (two) times daily. 60 tablet 9  . ciprofloxacin (CIPRO) 500 MG tablet Take 1 tablet (500 mg total) by mouth 2 (two) times daily. 14 tablet 0  . furosemide (LASIX) 20 MG tablet Take 40 mg by mouth 2 (two) times daily.    Marland Kitchen HYDROcodone-acetaminophen (NORCO) 10-325 MG tablet Take 1 tablet by mouth 2 (two) times daily as needed.  0  . latanoprost (XALATAN) 0.005 % ophthalmic solution Place 1 drop into both eyes at bedtime.    Marland Kitchen losartan (COZAAR) 25 MG tablet Take 1 tablet (25 mg total) by mouth daily. 30 tablet 6  . Protein (PROCEL 100 PO) Take 1 scoop by mouth 2 (two) times daily.    . traZODone (DESYREL) 50 MG tablet Take 50 mg by mouth at bedtime as needed.   0  . UNABLE TO FIND Med Name: MedPass 120 mL po TID between meals for supplement and nutritional support    . warfarin (COUMADIN) 4 MG tablet Take 1/2 to 1 tablet by mouth daily as directed by coumadin clinic 30 tablet 2   No current facility-administered medications for this visit.     Allergies:   Lactose intolerance (gi); Milk-related compounds; and Sulfa antibiotics   Social History:  The patient  reports  that she quit smoking about 41 years ago. Her smoking use included Cigarettes. She smoked 0.00 packs per day for 30.00 years. She has never used smokeless tobacco. She reports that she drinks alcohol. She reports that she does not use drugs.   Family History:  The patient's family history includes Heart disease in her mother; Hypertension in her father and mother; Stroke in her brother, father, and mother.    ROS:  Please see the history of present illness.   Otherwise, review of systems is positive for shortness of breath.   All other systems are reviewed and negative.    PHYSICAL EXAM: VS:  BP 126/64   Pulse 62   Ht 5\' 6"  (1.676 m)   Wt 151  lb (68.5 kg)   BMI 24.37 kg/m  , BMI Body mass index is 24.37 kg/m. GEN: Well nourished, well developed, in no acute distress  HEENT: normal  Neck: no JVD, carotid bruits, or masses Cardiac: RRR; 2/6 systolic murmur at the base, norubs, or gallops,no edema  Respiratory:  clear to auscultation bilaterally, normal work of breathing GI: soft, nontender, nondistended, + BS MS: no deformity or atrophy  Skin: warm and dry Neuro:  Strength and sensation are intact Psych: euthymic mood, full affect  EKG:  EKG is not ordered today. Personal review of the ekg ordered 06/23/16 today shows atrial paced, LAFB, prolonged QTc 522   Recent Labs: 08/22/2015: Magnesium 2.1 03/08/2016: Brain Natriuretic Peptide 386.4 03/22/2016: ALT 13; TSH 1.33 07/20/2016: BUN 27; Creatinine, Ser 1.68; Hemoglobin 9.5; Platelets 221; Potassium 4.3; Sodium 138    Lipid Panel  No results found for: CHOL, TRIG, HDL, CHOLHDL, VLDL, LDLCALC, LDLDIRECT   Wt Readings from Last 3 Encounters:  07/22/16 151 lb (68.5 kg)  07/20/16 141 lb (64 kg)  06/23/16 149 lb 3.2 oz (67.7 kg)      Other studies Reviewed: Additional studies/ records that were reviewed today include: TTE 01/17/15  Review of the above records today demonstrates:  - Left ventricle: The cavity size was mildly  reduced. Wall  thickness was increased in a pattern of severe LVH. Systolic  function was normal. The estimated ejection fraction was in the  range of 55% to 60%. - Left atrium: The atrium was moderately dilated. - Right atrium: The atrium was mildly dilated. - Pericardium, extracardiac: A trivial pericardial effusion was  identified.   ASSESSMENT AND PLAN:  1.  Persistent Atrial fibrillation/flutter:  On coumadin.  Unfortunately if atrial flutter ablation was performed, she would likely go back in atrial fibrillation. Her maintenance sinus rhythm today without an episode of atrial flutter since January per her pacemaker interrogation. Continue current management.   This patients CHA2DS2-VASc Score and unadjusted Ischemic Stroke Rate (% per year) is equal to 7.2 % stroke rate/year from a score of 5  Above score calculated as 1 point each if present [CHF, HTN, DM, Vascular=MI/PAD/Aortic Plaque, Age if 65-74, or Female] Above score calculated as 2 points each if present [Age > 75, or Stroke/TIA/TE]  2. Sick sinus syndrome: pacemaker placed 08/22/15. I told her there is no reason for her not to do water aerobics based on her pacemaker.  3. Shortness of breath: No evidence of heart failure on her exam. Echo with a normal ejection fraction. Device interrogation shows flat histograms without much heart rate variability. Currently rate response on today.   4. Hypertension: Well-controlled today. No changes to her medical management.  Current medicines are reviewed at length with the patient today.   The patient does not have concerns regarding her medicines.  The following changes were made today:  none  Labs/ tests ordered today include:  Orders Placed This Encounter  Procedures  . TSH  . Hepatic function panel   Disposition:   FU with Olubunmi Rothenberger 6 months  Signed, Tahsin Benyo Meredith Leeds, MD  07/22/2016 10:22 AM     [facility] [address] [place] [address] [phone] (office) [phone] (fax)

## 2016-07-20 NOTE — ED Notes (Signed)
Nurse currently drawing labs 

## 2016-07-20 NOTE — ED Notes (Addendum)
Pt's family member wheeled patient out while this nurse was in with another patient. RN was not able to obtain a last set of VS before patient was gone from the room. RN had helped patient get dressed and was waiting in the room for her ride.

## 2016-07-20 NOTE — ED Provider Notes (Signed)
TIME SEEN: 5:00 AM  CHIEF COMPLAINT: Urinary retention  HPI: Patient is an 83 year old female with history of atrial fibrillation on Coumadin, CHF, diabetes, hypertension, urinary retention status post chronic indwelling Foley catheter and frequent urinary tract infections who presents emergency department with urinary retention. Last had Foley catheter changed approximately 2-3 weeks ago by [facility]. Patient reports able to empty the bag at 7 PM last night but has only has 100 mL in her bag since and is having suprapubic discomfort and feels like she cannot empty her bladder. No fevers, chills, vomiting, diarrhea. No blood in the urine or clots.  ROS: See HPI Constitutional: no fever  Eyes: no drainage  ENT: no runny nose   Cardiovascular:  no chest pain  Resp: no SOB  GI: no vomiting GU: no dysuria Integumentary: no rash  Allergy: no hives  Musculoskeletal: no leg swelling  Neurological: no slurred speech ROS otherwise negative  PAST MEDICAL HISTORY/PAST SURGICAL HISTORY:  Past Medical History:  Diagnosis Date  . Arthritis    "all over"  . Asthma   . Atrial fibrillation ([place])    a. s/p DCCV in 01/2014 b. repeat DCCV in 06/2015, started on Amiodarone at that time. On Coumadin for anticoagulation.  . Cancer ([place])    rt breast  . Chronic diastolic CHF (congestive heart failure) ([place])    a. 08/2015: Echo with EF of 60-65%, no WMA, LA mod dilated, mild TR.  . Diabetes mellitus without complication ([place])   . Edema leg   . Fast breathing    "because of pill"  . GERD (gastroesophageal reflux disease)   . Glaucoma   . Glaucoma   . Hypertension   . Incontinence   . Pneumonia    h/o younger  . S/P total knee arthroplasty   . SSS (sick sinus syndrome) ([place])    a. s/p Medtronic Advisa DR MRI A2DR01 1 (serial number [id number]) PPM placement in 08/2015  . Swelling    bilateral feet/ legs, more left.  . Thyroid disease    "something years ago"  . UTI (urinary tract  infection) 02/12/2015  . Wears dentures   . Wears glasses     MEDICATIONS:  Prior to Admission medications   Medication Sig Start Date End Date Taking? Authorizing Provider  amiodarone (PACERONE) 200 MG tablet Take 1 tablet (200 mg total) by mouth daily. 10/29/15   Will Meredith Leeds, MD  anastrozole (ARIMIDEX) 1 MG tablet TAKE 1 TABLET BY MOUTH EVERY DAY 08/13/15   Monina C Medina-Vargas, NP  carvedilol (COREG) 12.5 MG tablet Take 1 tablet (12.5 mg total) by mouth 2 (two) times daily. 02/16/16 02/10/17  Will Meredith Leeds, MD  furosemide (LASIX) 20 MG tablet Take 40 mg by mouth 2 (two) times daily.    Historical Provider, MD  HYDROcodone-acetaminophen (NORCO) 10-325 MG tablet Take 1 tablet by mouth 2 (two) times daily as needed. 11/03/15   Historical Provider, MD  latanoprost (XALATAN) 0.005 % ophthalmic solution Place 1 drop into both eyes at bedtime.    Historical Provider, MD  losartan (COZAAR) 25 MG tablet Take 1 tablet (25 mg total) by mouth daily. 05/19/16 08/17/16  Troy Sine, MD  Protein (PROCEL 100 PO) Take 1 scoop by mouth 2 (two) times daily.    Historical Provider, MD  traZODone (DESYREL) 50 MG tablet Take 50 mg by mouth at bedtime as needed.  04/09/15   Historical Provider, MD  UNABLE TO FIND Med Name: MedPass 120 mL po  TID between meals for supplement and nutritional support    Historical Provider, MD  warfarin (COUMADIN) 4 MG tablet Take 1/2 to 1 tablet by mouth daily as directed by coumadin clinic 05/24/16   Troy Sine, MD    ALLERGIES:  Allergies  Allergen Reactions  . Lactose Intolerance (Gi) Other (See Comments)    Stomach pain  . Milk-Related Compounds Other (See Comments)    Upset stomach   . Sulfa Antibiotics Itching    SOCIAL HISTORY:  Social History  Substance Use Topics  . Smoking status: Former Smoker    Packs/day: 0.00    Years: 30.00    Types: Cigarettes    Quit date: 04/21/1975  . Smokeless tobacco: Never Used  . Alcohol use Yes     Comment:  occasionally    FAMILY HISTORY: Family History  Problem Relation Age of Onset  . Heart disease Mother   . Stroke Mother   . Hypertension Mother   . Stroke Father   . Hypertension Father   . Stroke Brother   . Heart attack Neg Hx     EXAM: BP (!) 180/86 (BP Location: Right Arm)   Pulse 60   Temp 97.6 F (36.4 C) (Oral)   Resp 20   Ht 5\' 6"  (1.676 m)   Wt 141 lb (64 kg)   SpO2 98%   BMI 22.76 kg/m  CONSTITUTIONAL: Alert and oriented and responds appropriately to questions. Elderly, appears uncomfortable but is afebrile and nontoxic HEAD: Normocephalic EYES: Conjunctivae clear, pupils appear equal, EOMI ENT: normal nose; moist mucous membranes NECK: Supple, no meningismus, no nuchal rigidity, no LAD  CARD: RRR; S1 and S2 appreciated; no murmurs, no clicks, no rubs, no gallops RESP: Normal chest excursion without splinting or tachypnea; breath sounds clear and equal bilaterally; no wheezes, no rhonchi, no rales, no hypoxia or respiratory distress, speaking full sentences ABD/GI: Normal bowel sounds; soft, patient has some suprapubic tenderness on exam with some mild distention in this area, no rebound, no guarding, no peritoneal signs, no hepatosplenomegaly BACK:  The back appears normal and is non-tender to palpation, there is no CVA tenderness EXT: Normal ROM in all joints; non-tender to palpation; no edema; normal capillary refill; no cyanosis, no calf tenderness or swelling    SKIN: Normal color for age and race; warm; no rash NEURO: Moves all extremities equally PSYCH: The patient's mood and manner are appropriate. Grooming and personal hygiene are appropriate.  MEDICAL DECISION MAKING: Patient here with urinary retention. Bladder scan reveals over 300 mL. Has indwelling Foley catheter in place. No gross hematuria or clot seen. Will replace Foley catheter. We'll send urinalysis with culture.  ED PROGRESS: Patient's labs are unremarkable. She has chronic kidney disease which  is unchanged. Urine shows small hemoglobin, small leukocytes, many bacteria and small squamous cells. This may just be colonization from indwelling Foley catheter but patient's family member at bedside is very concerned this could be another urinary tract infection and he thinks that this is often what causes her to have urinary retention. She feels better after Foley catheter has been replaced. Urine culture is pending. She has previously grown Escherichia coli, Proteus mirabilis, Pseudomonas aeruginosa that have all been sensitive to Cipro. We'll discharge with Cipro and outpatient urology follow-up. Discussed return to cautious. They're comfortable with this plan.  At this time, I do not feel there is any life-threatening condition present. I have reviewed and discussed all results (EKG, imaging, lab, urine as appropriate) and exam findings  with patient/family. I have reviewed nursing notes and appropriate previous records.  I feel the patient is safe to be discharged home without further emergent workup and can continue workup as an outpatient as needed. Discussed usual and customary return precautions. Patient/family verbalize understanding and are comfortable with this plan.  Outpatient follow-up has been provided if needed. All questions have been answered.      [place], DO 07/20/16 [phone]

## 2016-07-20 NOTE — ED Notes (Signed)
Pt departed in NAD.  

## 2016-07-20 NOTE — ED Notes (Signed)
Pt helped switch to leg bag and educated about how to change her back and decrease infection. Pt dressed and waiting for her brother.

## 2016-07-20 NOTE — ED Triage Notes (Signed)
Pt presents from home with family for urinary retention that began last night; pre-existing foley in place; pt reports feeling the need to urinate but not being able to go;

## 2016-07-21 LAB — URINE CULTURE

## 2016-07-22 ENCOUNTER — Encounter: Payer: Self-pay | Admitting: Cardiology

## 2016-07-22 ENCOUNTER — Ambulatory Visit (INDEPENDENT_AMBULATORY_CARE_PROVIDER_SITE_OTHER): Payer: Medicare HMO | Admitting: Cardiology

## 2016-07-22 VITALS — BP 126/64 | HR 62 | Ht 66.0 in | Wt 151.0 lb

## 2016-07-22 DIAGNOSIS — R001 Bradycardia, unspecified: Secondary | ICD-10-CM | POA: Diagnosis not present

## 2016-07-22 DIAGNOSIS — Z45018 Encounter for adjustment and management of other part of cardiac pacemaker: Secondary | ICD-10-CM | POA: Diagnosis not present

## 2016-07-22 DIAGNOSIS — I495 Sick sinus syndrome: Secondary | ICD-10-CM | POA: Diagnosis not present

## 2016-07-22 DIAGNOSIS — Z79899 Other long term (current) drug therapy: Secondary | ICD-10-CM | POA: Diagnosis not present

## 2016-07-22 DIAGNOSIS — I480 Paroxysmal atrial fibrillation: Secondary | ICD-10-CM | POA: Diagnosis not present

## 2016-07-22 LAB — CUP PACEART INCLINIC DEVICE CHECK
Battery Voltage: 3.02 V
Brady Statistic AP VS Percent: 97.14 %
Brady Statistic AS VP Percent: 0.45 %
Brady Statistic RA Percent Paced: 97.14 %
Brady Statistic RV Percent Paced: 0.5 %
Date Time Interrogation Session: 20180419104002
Implantable Lead Implant Date: 20170519
Implantable Lead Location: 753859
Implantable Lead Model: 5076
Implantable Pulse Generator Implant Date: 20170519
Lead Channel Impedance Value: 323 Ohm
Lead Channel Impedance Value: 399 Ohm
Lead Channel Pacing Threshold Pulse Width: 0.4 ms
Lead Channel Sensing Intrinsic Amplitude: 18.875 mV
Lead Channel Sensing Intrinsic Amplitude: 22.5 mV
Lead Channel Setting Pacing Amplitude: 1.5 V
Lead Channel Setting Pacing Amplitude: 2.5 V
Lead Channel Setting Pacing Pulse Width: 0.4 ms
Lead Channel Setting Sensing Sensitivity: 2.8 mV
MDC IDC LEAD IMPLANT DT: 20170519
MDC IDC LEAD LOCATION: 753860
MDC IDC MSMT BATTERY REMAINING LONGEVITY: 105 mo
MDC IDC MSMT LEADCHNL RA IMPEDANCE VALUE: 323 Ohm
MDC IDC MSMT LEADCHNL RA IMPEDANCE VALUE: 437 Ohm
MDC IDC MSMT LEADCHNL RA PACING THRESHOLD AMPLITUDE: 0.625 V
MDC IDC MSMT LEADCHNL RA SENSING INTR AMPL: 1.125 mV
MDC IDC MSMT LEADCHNL RA SENSING INTR AMPL: 1.25 mV
MDC IDC MSMT LEADCHNL RV PACING THRESHOLD AMPLITUDE: 1.125 V
MDC IDC MSMT LEADCHNL RV PACING THRESHOLD PULSEWIDTH: 0.4 ms
MDC IDC STAT BRADY AP VP PERCENT: 0.08 %
MDC IDC STAT BRADY AS VS PERCENT: 2.33 %

## 2016-07-22 NOTE — Patient Instructions (Signed)
Medication Instructions:    Your physician recommends that you continue on your current medications as directed. Please refer to the Current Medication list given to you today.  --- If you need a refill on your cardiac medications before your next appointment, please call your pharmacy. ---  Labwork:  Your physician recommends that you return for lab work in: June for Amiodarone surveillance lab work.  Testing/Procedures:  None ordered  Follow-Up: Remote monitoring is used to monitor your Pacemaker of ICD from home. This monitoring reduces the number of office visits required to check your device to one time per year. It allows [place] to keep an eye on the functioning of your device to ensure it is working properly. You are scheduled for a device check from home on 10/21/2016. You may send your transmission at any time that day. If you have a wireless device, the transmission will be sent automatically. After your physician reviews your transmission, you will receive a postcard with your next transmission date.   Your physician wants you to follow-up in: 6 months with Dr. Curt Bears.  You will receive a reminder letter in the mail two months in advance. If you don't receive a letter, please call our office to schedule the follow-up appointment.   Thank you for choosing [facility]!!   Trinidad Curet, RN [phone]

## 2016-07-26 ENCOUNTER — Other Ambulatory Visit: Payer: Self-pay

## 2016-07-26 MED ORDER — WARFARIN SODIUM 4 MG PO TABS: ORAL_TABLET | ORAL | 2 refills | Status: DC

## 2016-07-28 DIAGNOSIS — H9113 Presbycusis, bilateral: Secondary | ICD-10-CM | POA: Diagnosis not present

## 2016-07-28 DIAGNOSIS — E785 Hyperlipidemia, unspecified: Secondary | ICD-10-CM | POA: Diagnosis not present

## 2016-07-28 DIAGNOSIS — R002 Palpitations: Secondary | ICD-10-CM | POA: Diagnosis not present

## 2016-07-28 DIAGNOSIS — I70219 Atherosclerosis of native arteries of extremities with intermittent claudication, unspecified extremity: Secondary | ICD-10-CM | POA: Diagnosis not present

## 2016-07-28 DIAGNOSIS — Z1389 Encounter for screening for other disorder: Secondary | ICD-10-CM | POA: Diagnosis not present

## 2016-07-28 DIAGNOSIS — D485 Neoplasm of uncertain behavior of skin: Secondary | ICD-10-CM | POA: Diagnosis not present

## 2016-07-28 DIAGNOSIS — R42 Dizziness and giddiness: Secondary | ICD-10-CM | POA: Diagnosis not present

## 2016-07-28 DIAGNOSIS — I10 Essential (primary) hypertension: Secondary | ICD-10-CM | POA: Diagnosis not present

## 2016-07-28 DIAGNOSIS — Z0000 Encounter for general adult medical examination without abnormal findings: Secondary | ICD-10-CM | POA: Diagnosis not present

## 2016-07-28 DIAGNOSIS — R1032 Left lower quadrant pain: Secondary | ICD-10-CM | POA: Diagnosis not present

## 2016-07-29 ENCOUNTER — Ambulatory Visit (INDEPENDENT_AMBULATORY_CARE_PROVIDER_SITE_OTHER): Payer: Medicare HMO | Admitting: Pharmacist Clinician (PhC)/ Clinical Pharmacy Specialist

## 2016-07-29 DIAGNOSIS — Z131 Encounter for screening for diabetes mellitus: Secondary | ICD-10-CM | POA: Diagnosis not present

## 2016-07-29 DIAGNOSIS — E7800 Pure hypercholesterolemia, unspecified: Secondary | ICD-10-CM | POA: Diagnosis not present

## 2016-07-29 DIAGNOSIS — I480 Paroxysmal atrial fibrillation: Secondary | ICD-10-CM | POA: Diagnosis not present

## 2016-07-29 DIAGNOSIS — Z7901 Long term (current) use of anticoagulants: Secondary | ICD-10-CM | POA: Diagnosis not present

## 2016-07-29 DIAGNOSIS — R5383 Other fatigue: Secondary | ICD-10-CM | POA: Diagnosis not present

## 2016-07-29 DIAGNOSIS — E039 Hypothyroidism, unspecified: Secondary | ICD-10-CM | POA: Diagnosis not present

## 2016-07-29 DIAGNOSIS — I4891 Unspecified atrial fibrillation: Secondary | ICD-10-CM

## 2016-07-29 DIAGNOSIS — Z79899 Other long term (current) drug therapy: Secondary | ICD-10-CM | POA: Diagnosis not present

## 2016-07-29 LAB — POCT INR: INR: 2.7

## 2016-08-02 ENCOUNTER — Other Ambulatory Visit: Payer: Self-pay | Admitting: Cardiology

## 2016-08-02 MED ORDER — AMIODARONE HCL 200 MG PO TABS: 200.0000 mg | ORAL_TABLET | Freq: Every day | ORAL | 3 refills | Status: DC

## 2016-08-09 DIAGNOSIS — N312 Flaccid neuropathic bladder, not elsewhere classified: Secondary | ICD-10-CM | POA: Diagnosis not present

## 2016-08-09 DIAGNOSIS — R300 Dysuria: Secondary | ICD-10-CM | POA: Diagnosis not present

## 2016-08-12 DIAGNOSIS — K08109 Complete loss of teeth, unspecified cause, unspecified class: Secondary | ICD-10-CM | POA: Diagnosis not present

## 2016-08-12 DIAGNOSIS — Z950 Presence of cardiac pacemaker: Secondary | ICD-10-CM | POA: Diagnosis not present

## 2016-08-12 DIAGNOSIS — Z972 Presence of dental prosthetic device (complete) (partial): Secondary | ICD-10-CM | POA: Diagnosis not present

## 2016-08-12 DIAGNOSIS — H6123 Impacted cerumen, bilateral: Secondary | ICD-10-CM | POA: Diagnosis not present

## 2016-08-12 DIAGNOSIS — R32 Unspecified urinary incontinence: Secondary | ICD-10-CM | POA: Diagnosis not present

## 2016-08-12 DIAGNOSIS — I4891 Unspecified atrial fibrillation: Secondary | ICD-10-CM | POA: Diagnosis not present

## 2016-08-12 DIAGNOSIS — Z6825 Body mass index (BMI) 25.0-25.9, adult: Secondary | ICD-10-CM | POA: Diagnosis not present

## 2016-08-12 DIAGNOSIS — Z960 Presence of urogenital implants: Secondary | ICD-10-CM | POA: Diagnosis not present

## 2016-08-12 DIAGNOSIS — Z79899 Other long term (current) drug therapy: Secondary | ICD-10-CM | POA: Diagnosis not present

## 2016-08-12 DIAGNOSIS — I10 Essential (primary) hypertension: Secondary | ICD-10-CM | POA: Diagnosis not present

## 2016-08-12 DIAGNOSIS — Z96653 Presence of artificial knee joint, bilateral: Secondary | ICD-10-CM | POA: Diagnosis not present

## 2016-08-12 DIAGNOSIS — R0600 Dyspnea, unspecified: Secondary | ICD-10-CM | POA: Diagnosis not present

## 2016-08-12 DIAGNOSIS — H409 Unspecified glaucoma: Secondary | ICD-10-CM | POA: Diagnosis not present

## 2016-08-12 DIAGNOSIS — M25569 Pain in unspecified knee: Secondary | ICD-10-CM | POA: Diagnosis not present

## 2016-08-12 DIAGNOSIS — Z0000 Encounter for general adult medical examination without abnormal findings: Secondary | ICD-10-CM | POA: Diagnosis not present

## 2016-08-17 ENCOUNTER — Telehealth: Payer: Self-pay | Admitting: Pharmacist

## 2016-08-17 ENCOUNTER — Ambulatory Visit (INDEPENDENT_AMBULATORY_CARE_PROVIDER_SITE_OTHER): Payer: Medicare HMO | Admitting: Pharmacist Clinician (PhC)/ Clinical Pharmacy Specialist

## 2016-08-17 DIAGNOSIS — Z7901 Long term (current) use of anticoagulants: Secondary | ICD-10-CM | POA: Diagnosis not present

## 2016-08-17 DIAGNOSIS — I480 Paroxysmal atrial fibrillation: Secondary | ICD-10-CM

## 2016-08-17 DIAGNOSIS — I4891 Unspecified atrial fibrillation: Secondary | ICD-10-CM

## 2016-08-17 LAB — POCT INR: INR: 5.2

## 2016-08-17 NOTE — Telephone Encounter (Signed)
Lorazepam 1mg  qHS initiated per PCP

## 2016-08-20 DIAGNOSIS — H43811 Vitreous degeneration, right eye: Secondary | ICD-10-CM | POA: Diagnosis not present

## 2016-08-27 DIAGNOSIS — N312 Flaccid neuropathic bladder, not elsewhere classified: Secondary | ICD-10-CM | POA: Diagnosis not present

## 2016-09-07 DIAGNOSIS — N312 Flaccid neuropathic bladder, not elsewhere classified: Secondary | ICD-10-CM | POA: Diagnosis not present

## 2016-09-09 ENCOUNTER — Telehealth: Payer: Self-pay | Admitting: Cardiovascular Disease

## 2016-09-09 NOTE — Telephone Encounter (Signed)
New message    Pt c/o medication issue:  1. Name of Medication: HYDROcodone-acetaminophen (NORCO) 10-325 MG tablet  2. How are you currently taking this medication (dosage and times per day)? 10-325MG   3. Are you having a reaction (difficulty breathing--STAT)? NO  4. What is your medication issue? Pt states that she lost all of her prescriptions for the Hydrocodone and cannot send it to the pharmacy for a refill because it states it is too early. She wants to know what she should do because she cannot go without these medications. [facility] [address], [facility] day supply. She requests a call back.

## 2016-09-09 NOTE — Telephone Encounter (Signed)
COULD NOT REACH PT, NUMBER HAS CHANGED, LOST CALL WITH [name] BEFORE I COULD GET THE NUMBER, BAD CONNECTION & SIGNAL, HE DID EXPRESS UNDERSTANDING TO CALL PCP FOR REFILL REQUEST BEFORE WE LOST GOOD CONNECTION.

## 2016-09-14 DIAGNOSIS — R0602 Shortness of breath: Secondary | ICD-10-CM | POA: Diagnosis not present

## 2016-09-14 DIAGNOSIS — H8309 Labyrinthitis, unspecified ear: Secondary | ICD-10-CM | POA: Diagnosis not present

## 2016-09-14 DIAGNOSIS — R42 Dizziness and giddiness: Secondary | ICD-10-CM | POA: Diagnosis not present

## 2016-09-21 ENCOUNTER — Other Ambulatory Visit: Payer: Medicare HMO

## 2016-09-27 ENCOUNTER — Other Ambulatory Visit: Payer: Medicare HMO

## 2016-09-29 ENCOUNTER — Other Ambulatory Visit: Payer: Medicare HMO

## 2016-10-05 ENCOUNTER — Other Ambulatory Visit: Payer: Medicare HMO | Admitting: *Deleted

## 2016-10-05 DIAGNOSIS — Z79899 Other long term (current) drug therapy: Secondary | ICD-10-CM | POA: Diagnosis not present

## 2016-10-05 LAB — HEPATIC FUNCTION PANEL
ALT: 13 IU/L (ref 0–32)
AST: 23 IU/L (ref 0–40)
Albumin: 3.6 g/dL (ref 3.5–4.7)
Alkaline Phosphatase: 86 IU/L (ref 39–117)
BILIRUBIN TOTAL: 0.6 mg/dL (ref 0.0–1.2)
BILIRUBIN, DIRECT: 0.15 mg/dL (ref 0.00–0.40)
TOTAL PROTEIN: 6.6 g/dL (ref 6.0–8.5)

## 2016-10-05 LAB — TSH: TSH: 1.11 u[IU]/mL (ref 0.450–4.500)

## 2016-10-21 ENCOUNTER — Ambulatory Visit (INDEPENDENT_AMBULATORY_CARE_PROVIDER_SITE_OTHER): Payer: Medicare HMO | Admitting: *Deleted

## 2016-10-21 ENCOUNTER — Telehealth: Payer: Self-pay | Admitting: Cardiology

## 2016-10-21 DIAGNOSIS — I495 Sick sinus syndrome: Secondary | ICD-10-CM

## 2016-10-21 NOTE — Progress Notes (Signed)
Remote pacemaker transmission.   

## 2016-10-21 NOTE — Telephone Encounter (Signed)
Spoke with pt and reminded pt of remote transmission that is due today. Pt verbalized understanding.   

## 2016-10-22 LAB — CUP PACEART REMOTE DEVICE CHECK
Battery Remaining Longevity: 98 mo
Brady Statistic AP VS Percent: 94.81 %
Brady Statistic AS VP Percent: 0.07 %
Brady Statistic AS VS Percent: 5.05 %
Brady Statistic RV Percent Paced: 0.13 %
Implantable Lead Implant Date: 20170519
Implantable Lead Location: 753859
Implantable Lead Model: 5076
Implantable Lead Model: 5076
Lead Channel Impedance Value: 304 Ohm
Lead Channel Impedance Value: 437 Ohm
Lead Channel Pacing Threshold Amplitude: 0.75 V
Lead Channel Pacing Threshold Amplitude: 0.875 V
Lead Channel Sensing Intrinsic Amplitude: 1.5 mV
Lead Channel Sensing Intrinsic Amplitude: 1.5 mV
Lead Channel Sensing Intrinsic Amplitude: 19.875 mV
Lead Channel Sensing Intrinsic Amplitude: 19.875 mV
Lead Channel Setting Pacing Amplitude: 2.5 V
Lead Channel Setting Pacing Pulse Width: 0.4 ms
MDC IDC LEAD IMPLANT DT: 20170519
MDC IDC LEAD LOCATION: 753860
MDC IDC MSMT BATTERY VOLTAGE: 3.01 V
MDC IDC MSMT LEADCHNL RA PACING THRESHOLD PULSEWIDTH: 0.4 ms
MDC IDC MSMT LEADCHNL RV IMPEDANCE VALUE: 323 Ohm
MDC IDC MSMT LEADCHNL RV IMPEDANCE VALUE: 380 Ohm
MDC IDC MSMT LEADCHNL RV PACING THRESHOLD PULSEWIDTH: 0.4 ms
MDC IDC PG IMPLANT DT: 20170519
MDC IDC SESS DTM: 20180719163638
MDC IDC SET LEADCHNL RA PACING AMPLITUDE: 1.5 V
MDC IDC SET LEADCHNL RV SENSING SENSITIVITY: 2.8 mV
MDC IDC STAT BRADY AP VP PERCENT: 0.06 %
MDC IDC STAT BRADY RA PERCENT PACED: 94.84 %

## 2016-10-25 DIAGNOSIS — N312 Flaccid neuropathic bladder, not elsewhere classified: Secondary | ICD-10-CM | POA: Diagnosis not present

## 2016-10-28 ENCOUNTER — Encounter: Payer: Self-pay | Admitting: Cardiology

## 2016-10-29 ENCOUNTER — Ambulatory Visit (INDEPENDENT_AMBULATORY_CARE_PROVIDER_SITE_OTHER): Payer: Medicare HMO | Admitting: Pharmacist Clinician (PhC)/ Clinical Pharmacy Specialist

## 2016-10-29 DIAGNOSIS — Z7901 Long term (current) use of anticoagulants: Secondary | ICD-10-CM | POA: Diagnosis not present

## 2016-10-29 DIAGNOSIS — I4891 Unspecified atrial fibrillation: Secondary | ICD-10-CM

## 2016-10-29 DIAGNOSIS — I480 Paroxysmal atrial fibrillation: Secondary | ICD-10-CM | POA: Diagnosis not present

## 2016-10-29 LAB — POCT INR: INR: 1.9

## 2016-11-16 ENCOUNTER — Ambulatory Visit (INDEPENDENT_AMBULATORY_CARE_PROVIDER_SITE_OTHER): Payer: Medicare HMO | Admitting: Pharmacist Clinician (PhC)/ Clinical Pharmacy Specialist

## 2016-11-16 DIAGNOSIS — Z7901 Long term (current) use of anticoagulants: Secondary | ICD-10-CM

## 2016-11-16 DIAGNOSIS — I480 Paroxysmal atrial fibrillation: Secondary | ICD-10-CM

## 2016-11-16 DIAGNOSIS — I4891 Unspecified atrial fibrillation: Secondary | ICD-10-CM | POA: Diagnosis not present

## 2016-11-16 LAB — POCT INR: INR: 1.6

## 2016-11-17 ENCOUNTER — Other Ambulatory Visit: Payer: Self-pay | Admitting: Cardiovascular Disease

## 2016-11-24 ENCOUNTER — Ambulatory Visit: Payer: Medicare Other | Admitting: Podiatry

## 2016-11-30 ENCOUNTER — Ambulatory Visit (INDEPENDENT_AMBULATORY_CARE_PROVIDER_SITE_OTHER): Payer: Medicare HMO | Admitting: Pharmacist Clinician (PhC)/ Clinical Pharmacy Specialist

## 2016-11-30 DIAGNOSIS — Z7901 Long term (current) use of anticoagulants: Secondary | ICD-10-CM | POA: Diagnosis not present

## 2016-11-30 DIAGNOSIS — I4891 Unspecified atrial fibrillation: Secondary | ICD-10-CM

## 2016-11-30 DIAGNOSIS — I480 Paroxysmal atrial fibrillation: Secondary | ICD-10-CM | POA: Diagnosis not present

## 2016-11-30 LAB — POCT INR: INR: 3.1

## 2016-12-01 ENCOUNTER — Ambulatory Visit (INDEPENDENT_AMBULATORY_CARE_PROVIDER_SITE_OTHER): Payer: Medicare HMO | Admitting: Podiatry

## 2016-12-01 DIAGNOSIS — B351 Tinea unguium: Secondary | ICD-10-CM

## 2016-12-01 DIAGNOSIS — M79676 Pain in unspecified toe(s): Secondary | ICD-10-CM

## 2016-12-01 DIAGNOSIS — L84 Corns and callosities: Secondary | ICD-10-CM | POA: Diagnosis not present

## 2016-12-01 NOTE — Progress Notes (Signed)
   SUBJECTIVE Patient  presents to office today complaining of elongated, thickened nails. Pain while ambulating in shoes. Patient is unable to trim their own nails.  Patient also complains of painful callus lesions bilateral feet. Patient states that several years ago she had surgery with Dr. Paulla Dolly for hammertoe correction.  OBJECTIVE General Patient is awake, alert, and oriented x 3 and in no acute distress. Derm Skin is dry and supple bilateral. Negative open lesions or macerations. Remaining integument unremarkable. Nails are tender, long, thickened and dystrophic with subungual debris, consistent with onychomycosis, 1-5 bilateral. No signs of infection noted. Vasc  DP and PT pedal pulses palpable bilaterally. Temperature gradient within normal limits.  Neuro Epicritic and protective threshold sensation diminished bilaterally.  Musculoskeletal Exam No symptomatic pedal deformities noted bilateral. Muscular strength within normal limits.  ASSESSMENT 1. Onychodystrophic nails 1-5 bilateral with hyperkeratosis of nails.  2. Onychomycosis of nail due to dermatophyte bilateral 3.  Symptomatic corn  Lesions to the bilateral digits 3  PLAN OF CARE 1. Patient evaluated today.  2. Instructed to maintain good pedal hygiene and foot care.  3. Mechanical debridement of nails 1-5 bilaterally performed using a nail nipper. Filed with dremel without incident.  4.  Excisional debridement of the symptomatic corn lesions was performed using a chisel blade and tissue nipper without incident or bleeding.  5.Return to clinic in 3 mos.    Edrick Kins, DPM [facility]  Dr. Edrick Kins, [address]                                        [place], [place] [address]                Office [phone]  Fax [phone]

## 2016-12-08 DIAGNOSIS — M9191 Juvenile osteochondrosis of hip and pelvis, unspecified, right leg: Secondary | ICD-10-CM | POA: Diagnosis not present

## 2016-12-08 DIAGNOSIS — Z23 Encounter for immunization: Secondary | ICD-10-CM | POA: Diagnosis not present

## 2016-12-10 DIAGNOSIS — N312 Flaccid neuropathic bladder, not elsewhere classified: Secondary | ICD-10-CM | POA: Diagnosis not present

## 2016-12-13 ENCOUNTER — Ambulatory Visit (INDEPENDENT_AMBULATORY_CARE_PROVIDER_SITE_OTHER): Payer: Medicare HMO | Admitting: Pharmacist

## 2016-12-13 DIAGNOSIS — I480 Paroxysmal atrial fibrillation: Secondary | ICD-10-CM | POA: Diagnosis not present

## 2016-12-13 DIAGNOSIS — Z7901 Long term (current) use of anticoagulants: Secondary | ICD-10-CM | POA: Diagnosis not present

## 2016-12-13 DIAGNOSIS — I4891 Unspecified atrial fibrillation: Secondary | ICD-10-CM

## 2016-12-13 LAB — POCT INR: INR: 3.1

## 2016-12-14 ENCOUNTER — Other Ambulatory Visit: Payer: Self-pay | Admitting: Cardiovascular Disease

## 2016-12-22 ENCOUNTER — Other Ambulatory Visit: Payer: Self-pay | Admitting: Cardiology

## 2016-12-22 DIAGNOSIS — R0602 Shortness of breath: Secondary | ICD-10-CM

## 2016-12-30 ENCOUNTER — Ambulatory Visit: Payer: Medicare HMO | Admitting: Cardiovascular Disease

## 2016-12-31 DIAGNOSIS — H43811 Vitreous degeneration, right eye: Secondary | ICD-10-CM | POA: Diagnosis not present

## 2017-01-03 ENCOUNTER — Ambulatory Visit (INDEPENDENT_AMBULATORY_CARE_PROVIDER_SITE_OTHER): Payer: Medicare HMO | Admitting: Pharmacist

## 2017-01-03 DIAGNOSIS — Z7901 Long term (current) use of anticoagulants: Secondary | ICD-10-CM | POA: Diagnosis not present

## 2017-01-03 DIAGNOSIS — I480 Paroxysmal atrial fibrillation: Secondary | ICD-10-CM

## 2017-01-03 DIAGNOSIS — I4891 Unspecified atrial fibrillation: Secondary | ICD-10-CM

## 2017-01-03 LAB — POCT INR: INR: 3

## 2017-01-06 ENCOUNTER — Telehealth: Payer: Self-pay | Admitting: Pharmacist

## 2017-01-06 NOTE — Telephone Encounter (Signed)
Patient has an appointment with PA on 01/11/2017. Bother (caregiver) will prefer if MD can see her instead.   Please call patient to discuss

## 2017-01-07 NOTE — Telephone Encounter (Signed)
Called and offered appointment with Dr. Claiborne Fletcher Monday morning.   Patient is now scheduled to see Dr. Claiborne Fletcher Monday 10/8 at 8:40.  Patient aware and verbalized understanding.

## 2017-01-10 ENCOUNTER — Ambulatory Visit: Payer: Medicare HMO | Admitting: Cardiovascular Disease

## 2017-01-11 ENCOUNTER — Ambulatory Visit: Payer: Medicare HMO | Admitting: Student

## 2017-01-16 NOTE — Progress Notes (Signed)
Cardiology Office Note    Date:  01/17/2017   ID:  Natasha Fletcher, DOB Oct 13, 1925, MRN [id number]  PCP:  Lorene Dy, MD  Cardiologist: Dr. Claiborne Billings Electrophysiologist: Dr. Curt Bears  Chief Complaint  Patient presents with  . Follow-up    shortness of breath, fatigue    History of Present Illness:    Natasha Fletcher is a 83 y.o. female with past medical history of chronic diastolic CHF (EF 63-14% by echo in 08/2015), PAF (s/p multiple DCCV's with most recent being in 06/2015, having been started on Amiodarone at that time), SSS (s/p PPM placement 08/2015), HTN, and right breast cancer who presents to the office today for evaluation of worsening dyspnea and fatigue.   She was last examined by Dr. Curt Bears in 07/2016 and reported having worsening shortness of breath with exertion. She did not appear volume overloaded by physical examination.  Her device was interoggated and functioning normally, showing no significant variability on the histograms. Therefore, she was continued on her current medication regimen.    In talking with the patient today, she reports continued dyspnea on exertion ever since pacemaker placement in 08/2015. She reports this occurs with walking less than 50 feet and denies any associated chest discomfort or palpitations. No recent orthopnea, PND, or lower extremity edema. Reports weights have been stable on her home scales and denies missing any recent doses of her Lasix. Monitors sodium and fluid intake.  She does not check her blood pressure regularly at home but is well-controlled at 107/61 during today's visit.   She remains on Coumadin for anticoagulation and denies any evidence of active bleeding.    Past Medical History:  Diagnosis Date  . Arthritis    "all over"  . Asthma   . Atrial fibrillation ([place])    a. s/p DCCV in 01/2014 b. repeat DCCV in 06/2015, started on Amiodarone at that time. On Coumadin for anticoagulation.  . Cancer ([place])    rt breast   . Chronic diastolic CHF (congestive heart failure) ([place])    a. 08/2015: Echo with EF of 60-65%, no WMA, LA mod dilated, mild TR.  . Diabetes mellitus without complication ([place])   . Edema leg   . Fast breathing    "because of pill"  . GERD (gastroesophageal reflux disease)   . Glaucoma   . Glaucoma   . Hypertension   . Incontinence   . Pneumonia    h/o younger  . S/P total knee arthroplasty   . SSS (sick sinus syndrome) ([place])    a. s/p Medtronic Advisa DR MRI [id number] (serial number [id number]) PPM placement in 08/2015  . Swelling    bilateral feet/ legs, more left.  . Thyroid disease    "something years ago"  . UTI (urinary tract infection) 02/12/2015  . Wears dentures   . Wears glasses     Past Surgical History:  Procedure Laterality Date  . ABDOMINAL HYSTERECTOMY    . APPENDECTOMY    . BREAST BIOPSY  04/27/2011   Procedure: BREAST BIOPSY WITH NEEDLE LOCALIZATION;  Surgeon: Edward Jolly, MD;  Location: [place];  Service: General;  Laterality: Right;  right needle localized breast lumpectomy   . BREAST LUMPECTOMY  04/27/11   right breast by [facility]  . CARDIOVERSION N/A 02/01/2014   Procedure: CARDIOVERSION;  Surgeon: Candee Furbish, MD;  Location: [facility] ENDOSCOPY;  Service: Cardiovascular;  Laterality: N/A;  . CARDIOVERSION N/A 06/04/2015   Procedure: CARDIOVERSION;  Surgeon: Houston Siren  Hazel Sams, MD;  Location: [place];  Service: Cardiovascular;  Laterality: N/A;  . CARDIOVERSION N/A 07/14/2015   Procedure: CARDIOVERSION;  Surgeon: Thayer Headings, MD;  Location: [facility] ENDOSCOPY;  Service: Cardiovascular;  Laterality: N/A;  . CARPAL TUNNEL RELEASE Bilateral   . COLONOSCOPY    . EP IMPLANTABLE DEVICE N/A 08/22/2015   Procedure: Pacemaker Implant;  Surgeon: Will Meredith Leeds, MD;  Medtronic Advisa DR MRI [id number] (serial number [id number]);  Laterality: N/A;  . EYE SURGERY     both cataracts  . SHOULDER SURGERY Left   . TEE WITHOUT CARDIOVERSION N/A  02/01/2014   Procedure: TRANSESOPHAGEAL ECHOCARDIOGRAM (TEE);  Surgeon: Candee Furbish, MD;  Location[facility];  Service: Cardiovascular;  Laterality: N/A;  . TONSILLECTOMY    . TOTAL HIP ARTHROPLASTY Bilateral   . TOTAL KNEE ARTHROPLASTY Right 02/19/2013   Procedure: RIGHT TOTAL KNEE ARTHROPLASTY;  Surgeon: Gearlean Alf, MD;  Location: [facility] ORS;  Service: Orthopedics;  Laterality: Right;  . TOTAL KNEE ARTHROPLASTY Left 10/15/2013   Procedure: LEFT TOTAL KNEE ARTHROPLASTY;  Surgeon: Gearlean Alf, MD;  Location: [facility] ORS;  Service: Orthopedics;  Laterality: Left;    Current Medications: Outpatient Medications Prior to Visit  Medication Sig Dispense Refill  . amiodarone (PACERONE) 200 MG tablet Take 1 tablet (200 mg total) by mouth daily. 90 tablet 3  . anastrozole (ARIMIDEX) 1 MG tablet TAKE 1 TABLET BY MOUTH EVERY DAY 30 tablet 2  . carvedilol (COREG) 12.5 MG tablet TAKE 1 TABLET BY MOUTH TWICE A DAY 60 tablet 6  . furosemide (LASIX) 20 MG tablet Take 40 mg by mouth 2 (two) times daily.    Marland Kitchen HYDROcodone-acetaminophen (NORCO) 10-325 MG tablet Take 1 tablet by mouth 2 (two) times daily as needed.  0  . latanoprost (XALATAN) 0.005 % ophthalmic solution Place 1 drop into both eyes at bedtime.    Marland Kitchen LORazepam (ATIVAN) 1 MG tablet Take 1 mg by mouth at bedtime.    Marland Kitchen losartan (COZAAR) 25 MG tablet TAKE 1 TABLET EVERY DAY 30 tablet 6  . Protein (PROCEL 100 PO) Take 1 scoop by mouth 2 (two) times daily.    . traZODone (DESYREL) 50 MG tablet Take 50 mg by mouth at bedtime as needed.   0  . UNABLE TO FIND Med Name: MedPass 120 mL po TID between meals for supplement and nutritional support    . warfarin (COUMADIN) 4 MG tablet Take 1/2 to 1 tablet by mouth daily as directed by coumadin clinic 30 tablet 2  . warfarin (COUMADIN) 4 MG tablet TAKE 1/2 TO 1 TABLET BY MOUTH DAILY AS DIRECTED BY COUMADIN CLINIC 30 tablet 2  . ciprofloxacin (CIPRO) 500 MG tablet Take 1 tablet (500 mg total) by mouth 2 (two)  times daily. (Patient not taking: Reported on 01/17/2017) 14 tablet 0   No facility-administered medications prior to visit.      Allergies:   Lactose intolerance (gi); Milk-related compounds; and Sulfa antibiotics   Social History   Social History  . Marital status: Single    Spouse name: N/A  . Number of children: N/A  . Years of education: N/A   Social History Main Topics  . Smoking status: Former Smoker    Packs/day: 0.00    Years: 30.00    Types: Cigarettes    Quit date: 04/21/1975  . Smokeless tobacco: Never Used  . Alcohol use Yes     Comment: occasionally  . Drug use: No  .  Sexual activity: Not Currently   Other Topics Concern  . None   Social History Narrative   From SNF, getting PT and using cane.       Family History:  The patient's family history includes Heart disease in her mother; Hypertension in her father and mother; Stroke in her brother, father, and mother.   Review of Systems:   Please see the history of present illness.     General:  No chills, fever, night sweats or weight changes.  Cardiovascular:  No chest pain, edema, orthopnea, palpitations, paroxysmal nocturnal dyspnea. Positive for dyspnea on exertion.  Dermatological: No rash, lesions/masses Respiratory: No cough, dyspnea Urologic: No hematuria, dysuria Abdominal:   No nausea, vomiting, diarrhea, bright red blood per rectum, melena, or hematemesis Neurologic:  No visual changes, wkns, changes in mental status.  All other systems reviewed and are otherwise negative except as noted above.   Physical Exam:    VS:  BP 107/61   Pulse 62   Ht 5\' 6"  (1.676 m)   Wt 154 lb (69.9 kg)   SpO2 97%   BMI 24.86 kg/m    General: Well developed, elderly African American female appearing in no acute distress. Head: Normocephalic, atraumatic, sclera non-icteric, no xanthomas, nares are without discharge.  Neck: No carotid bruits. JVD not elevated.  Lungs: Respirations regular and unlabored,  without wheezes or rales.  Heart: Regular rate and rhythm. No S3 or S4.  No murmur, no rubs, or gallops appreciated. Abdomen: Soft, non-tender, non-distended with normoactive bowel sounds. No hepatomegaly. No rebound/guarding. No obvious abdominal masses. Msk:  Strength and tone appear normal for age. No joint deformities or effusions. Extremities: No clubbing or cyanosis. No lower extremity edema.  Distal pedal pulses are 2+ bilaterally. Neuro: Alert and oriented X 3. Moves all extremities spontaneously. No focal deficits noted. Psych:  Responds to questions appropriately with a normal affect. Skin: No rashes or lesions noted  Wt Readings from Last 3 Encounters:  01/17/17 154 lb (69.9 kg)  07/22/16 151 lb (68.5 kg)  07/20/16 141 lb (64 kg)     Studies/Labs Reviewed:   EKG:  EKG is not ordered today.   Recent Labs: 03/08/2016: Brain Natriuretic Peptide 386.4 07/20/2016: BUN 27; Creatinine, Ser 1.68; Hemoglobin 9.5; Platelets 221; Potassium 4.3; Sodium 138 10/05/2016: ALT 13; TSH 1.110   Lipid Panel No results found for: CHOL, TRIG, HDL, CHOLHDL, VLDL, LDLCALC, LDLDIRECT  Additional studies/ records that were reviewed today include:   Echocardiogram: 04/2016 Study Conclusions  - Left ventricle: The cavity size was normal. Wall thickness was   increased in a pattern of severe LVH. Systolic function was   normal. The estimated ejection fraction was in the range of 60%   to 65%. Features are consistent with a pseudonormal left   ventricular filling pattern, with concomitant abnormal relaxation   and increased filling pressure (grade 2 diastolic dysfunction). - Right ventricle: Systolic function was mildly to moderately   reduced. - Tricuspid valve: There was moderate regurgitation. - Pulmonary arteries: Systolic pressure was moderately to severely   increased. PA peak pressure: 69 mm Hg (S).  Impressions:  - Normal LV systolic function. Grade 2 diastolic dysfunction   Severe  LVH   Moderate - severel pulmonary HTN .   Assessment:    1. Dyspnea on exertion   2. PAF (paroxysmal atrial fibrillation) ([place])   3. SSS (sick sinus syndrome) ([place])   4. Encounter for monitoring amiodarone therapy   5. Normocytic anemia  Plan:   In order of problems listed above:  1. Dyspnea on Exertion - The patient reports this has been a chronic issue since undergoing pacemaker placement in 08/2015. She denies any associated orthopnea, PND, lower extremity edema, chest discomfort, palpitations, or weight gain. - She does not appear volume overloaded by physical examination. - Recent echocardiogram in 04/2016 showed a preserved EF of 60-65% with grade 2 diastolic dysfunction, moderate TR, and elevated pulmonary pressures. Her pacemaker is followed by the device clinic and has shown no significant abnormalities.  - ambulatory oxygen saturations checked in the office today and remained > 98% but she reported having dyspnea at that time.  - will check CBC to assess Hgb in the setting of known anemia. Will also obtain CXR to assess for any pulmonary abnormalities as she has been on Amiodarone for greater than 1 year.   2. Paroxysmal Atrial Fibrillation - she denies any recent palpitations. Most recent device check in 10/2016 showed rare episodes of AT. Continue Coreg 12.5mg  BID for rate-control and Amiodarone 200mg  daily (with plans for CXR as above). TSH and LFT's recently checked and within normal limits.  - she denies any evidence of active bleeding. Continue Coumadin for anticoagulation.   3. Chronic Diastolic CHF - She does not appear volume overloaded by physical examination. - Continue Lasix 40 mg BID along with Coreg and Losartan.   4. SSS - s/p PPM placement in 08/2015.  - followed by Dr. Curt Bears  5. HTN - BP is well-controlled at 107/61 during today's visit. - Continue Coreg 12.5 mg BID and Losartan 25 mg daily.  6. Normocytic Anemia - Hgb at 9.5 in 07/2016. -  she denies any evidence of active bleeding. Recheck CBC today.    Medication Adjustments/Labs and Tests Ordered: Current medicines are reviewed at length with the patient today.  Concerns regarding medicines are outlined above.  Medication changes, Labs and Tests ordered today are listed in the Patient Instructions below. Patient Instructions  Bernerd Pho, [place] recommends that you continue on your current medications as directed. Please refer to the Current Medication list given to you today.  Your physician recommends that you return for lab work TODAY.  Please keep your follow-up with Dr Claiborne Billings as scheduled for 02/15/17.    Signed, Erma Heritage, PA-C  01/17/2017 8:06 PM    [facility] [place], [place], [place]  [address] Phone: [phone]; Fax: [phone]  [address] [place], [place] [address] Phone: [phone]

## 2017-01-17 ENCOUNTER — Ambulatory Visit (INDEPENDENT_AMBULATORY_CARE_PROVIDER_SITE_OTHER): Payer: Medicare HMO | Admitting: Student

## 2017-01-17 ENCOUNTER — Encounter: Payer: Self-pay | Admitting: Student

## 2017-01-17 VITALS — BP 107/61 | HR 62 | Ht 66.0 in | Wt 154.0 lb

## 2017-01-17 DIAGNOSIS — Z5181 Encounter for therapeutic drug level monitoring: Secondary | ICD-10-CM

## 2017-01-17 DIAGNOSIS — I5032 Chronic diastolic (congestive) heart failure: Secondary | ICD-10-CM

## 2017-01-17 DIAGNOSIS — D649 Anemia, unspecified: Secondary | ICD-10-CM | POA: Diagnosis not present

## 2017-01-17 DIAGNOSIS — I495 Sick sinus syndrome: Secondary | ICD-10-CM | POA: Diagnosis not present

## 2017-01-17 DIAGNOSIS — R0609 Other forms of dyspnea: Secondary | ICD-10-CM | POA: Diagnosis not present

## 2017-01-17 DIAGNOSIS — I10 Essential (primary) hypertension: Secondary | ICD-10-CM | POA: Diagnosis not present

## 2017-01-17 DIAGNOSIS — I480 Paroxysmal atrial fibrillation: Secondary | ICD-10-CM

## 2017-01-17 DIAGNOSIS — R0600 Dyspnea, unspecified: Secondary | ICD-10-CM

## 2017-01-17 DIAGNOSIS — Z79899 Other long term (current) drug therapy: Secondary | ICD-10-CM

## 2017-01-17 NOTE — Patient Instructions (Signed)
Bernerd Pho, PA recommends that you continue on your current medications as directed. Please refer to the Current Medication list given to you today.  Your physician recommends that you return for lab work TODAY.  Please keep your follow-up with Dr Claiborne Billings as scheduled for 02/15/17.

## 2017-01-18 ENCOUNTER — Telehealth: Payer: Self-pay

## 2017-01-18 LAB — CBC
Hematocrit: 32.3 % — ABNORMAL LOW (ref 34.0–46.6)
Hemoglobin: 9.7 g/dL — ABNORMAL LOW (ref 11.1–15.9)
MCH: 24.1 pg — ABNORMAL LOW (ref 26.6–33.0)
MCHC: 30 g/dL — ABNORMAL LOW (ref 31.5–35.7)
MCV: 80 fL (ref 79–97)
PLATELETS: 234 10*3/uL (ref 150–379)
RBC: 4.02 x10E6/uL (ref 3.77–5.28)
RDW: 17 % — AB (ref 12.3–15.4)
WBC: 5.2 10*3/uL (ref 3.4–10.8)

## 2017-01-18 LAB — PRO B NATRIURETIC PEPTIDE: NT-PRO BNP: 4311 pg/mL — AB (ref 0–738)

## 2017-01-18 MED ORDER — FUROSEMIDE 40 MG PO TABS: 40.0000 mg | ORAL_TABLET | Freq: Two times a day (BID) | ORAL | 3 refills | Status: DC

## 2017-01-18 NOTE — Telephone Encounter (Signed)
Called patient, labs reviewed. Patient has not had CXR as of yet. States she has to wait on her brother to give her a ride either tomorrow or Thursday. Patient is currently taking Furosemide 40 mg in the morning and 20 mg in the evening. Discussed with Maude Leriche, PA. Take 40 mg BID. Patient verbalized understanding, agree with plan, encouraged to have CXR. Patient voiced appreciation for phone call. Rx(s) sent to pharmacy electronically.

## 2017-01-18 NOTE — Telephone Encounter (Signed)
Erma Heritage, PA-C  T[place], Dionne Bucy, CMA        Please let the patient know her labs showed a stable Hgb of 9.7. BNP was elevated which is consistent with fluid overload. Please have her increase Lasix to 80mg  in AM and 40mg  in PM for 4 days then resume 40mg  BID. Continue with plans for CXR. Thank you.

## 2017-01-19 ENCOUNTER — Telehealth: Payer: Self-pay | Admitting: Student

## 2017-01-19 NOTE — Telephone Encounter (Signed)
Spoke w patient, clarified recommendations based on conversation she had w [place] yesterday. Restated recommendations based on labwork to increase lasix to 40mg  BID. Pt acknowledged & will begin changes to medical therapy today.  Advised if no improvement to her fluid retention/SOB after a few days, pt should call for further recommendations. Pt verbalized understanding & thanks for call.

## 2017-01-19 NOTE — Telephone Encounter (Signed)
Please call,have some questions about her lab results.

## 2017-01-20 ENCOUNTER — Telehealth: Payer: Self-pay | Admitting: Student

## 2017-01-20 ENCOUNTER — Ambulatory Visit
Admission: RE | Admit: 2017-01-20 | Discharge: 2017-01-20 | Disposition: A | Discharge status: Home / Self Care | Payer: Medicare HMO | Source: Ambulatory Visit | Attending: Student | Admitting: Student

## 2017-01-20 ENCOUNTER — Ambulatory Visit (INDEPENDENT_AMBULATORY_CARE_PROVIDER_SITE_OTHER): Payer: Medicare HMO | Admitting: *Deleted

## 2017-01-20 ENCOUNTER — Telehealth: Payer: Self-pay | Admitting: Cardiology

## 2017-01-20 DIAGNOSIS — I517 Cardiomegaly: Secondary | ICD-10-CM | POA: Diagnosis not present

## 2017-01-20 DIAGNOSIS — I495 Sick sinus syndrome: Secondary | ICD-10-CM

## 2017-01-20 DIAGNOSIS — Z5181 Encounter for therapeutic drug level monitoring: Secondary | ICD-10-CM

## 2017-01-20 DIAGNOSIS — Z79899 Other long term (current) drug therapy: Secondary | ICD-10-CM

## 2017-01-20 DIAGNOSIS — R0600 Dyspnea, unspecified: Secondary | ICD-10-CM

## 2017-01-20 DIAGNOSIS — R0609 Other forms of dyspnea: Principal | ICD-10-CM

## 2017-01-20 NOTE — Telephone Encounter (Signed)
Advised patient of results   Notes recorded by Erma Heritage, PA-C on 01/20/2017 at 9:40 AM EDT Please let the patient know her CXR showed mild vascular congestion (consistent with her lab work showing fluid overload). No evidence of PNA. Continue with Lasix 40mg  BID until she sees Dr. Claiborne Billings in 4 weeks. Will need a repeat BMET at that time. Thank you.

## 2017-01-20 NOTE — Telephone Encounter (Signed)
Spoke with pt and reminded pt of remote transmission that is due today. Pt verbalized understanding.   

## 2017-01-20 NOTE — Telephone Encounter (Signed)
New message    Pt is calling about her chest xray. Please call.

## 2017-01-20 NOTE — Progress Notes (Signed)
Remote pacemaker transmission.   

## 2017-01-21 ENCOUNTER — Telehealth: Payer: Self-pay | Admitting: Student

## 2017-01-21 NOTE — Telephone Encounter (Signed)
New message    Patient wants to know how long the medication will take to get her blood pressure regulated.  Please call    1. Name of Medication: furosemide (LASIX) 40 MG tablet  2. How are you currently taking this medication (dosage and times per day)? Take 1 tablet (40 mg total) by mouth 2 (two) times daily.  3. Are you having a reaction (difficulty breathing--STAT)? No  4. What is your medication issue?   Patient feels week

## 2017-01-21 NOTE — Telephone Encounter (Signed)
Agree with recommendations.  

## 2017-01-21 NOTE — Telephone Encounter (Signed)
Returned call to patient of Dr. Claiborne Billings, seen by Ahmed Prima, [place] on 10/15.  She reports shortness of breath when doing things that has been going on a while. She just started lasix 40mg  twice daily  - after CXR results ordered by B. Strader on 10/15. She does not monitor home weights. She reports she has been eating a lot of "corn chips" for the last few months  Educated patient on importance of daily weights, avoiding salt/sodium in her diet d/t HF. Advised to choose fresh fruits/vegs, low sodium canned foods, avoid fried foods, avoid chips/etc. Stressed importance of med compliance and emphasized importance of taking lasix as directed to help with volume overload to help her breathing. She voiced understanding. Advised her to call our office if symptoms worsen over the weekend.   CC: MD & PA as Juluis Rainier

## 2017-01-25 DIAGNOSIS — N312 Flaccid neuropathic bladder, not elsewhere classified: Secondary | ICD-10-CM | POA: Diagnosis not present

## 2017-01-27 ENCOUNTER — Encounter: Payer: Self-pay | Admitting: Cardiology

## 2017-01-31 ENCOUNTER — Ambulatory Visit (INDEPENDENT_AMBULATORY_CARE_PROVIDER_SITE_OTHER): Payer: Medicare HMO | Admitting: Pharmacist

## 2017-01-31 DIAGNOSIS — I480 Paroxysmal atrial fibrillation: Secondary | ICD-10-CM

## 2017-01-31 DIAGNOSIS — Z7901 Long term (current) use of anticoagulants: Secondary | ICD-10-CM

## 2017-01-31 DIAGNOSIS — I4891 Unspecified atrial fibrillation: Secondary | ICD-10-CM

## 2017-01-31 LAB — POCT INR: INR: 4.2

## 2017-02-02 ENCOUNTER — Ambulatory Visit: Payer: Medicare HMO | Admitting: Student

## 2017-02-11 LAB — CUP PACEART REMOTE DEVICE CHECK
Battery Voltage: 3.01 V
Brady Statistic AP VP Percent: 0.08 %
Brady Statistic RA Percent Paced: 98.6 %
Implantable Lead Implant Date: 20170519
Implantable Lead Implant Date: 20170519
Implantable Lead Location: 753859
Implantable Lead Model: 5076
Implantable Pulse Generator Implant Date: 20170519
Lead Channel Impedance Value: 323 Ohm
Lead Channel Impedance Value: 399 Ohm
Lead Channel Impedance Value: 418 Ohm
Lead Channel Pacing Threshold Amplitude: 0.75 V
Lead Channel Pacing Threshold Amplitude: 1.125 V
Lead Channel Sensing Intrinsic Amplitude: 1.25 mV
Lead Channel Sensing Intrinsic Amplitude: 18.375 mV
Lead Channel Sensing Intrinsic Amplitude: 18.375 mV
Lead Channel Setting Pacing Amplitude: 1.5 V
Lead Channel Setting Pacing Amplitude: 2.5 V
Lead Channel Setting Pacing Pulse Width: 0.4 ms
Lead Channel Setting Sensing Sensitivity: 2.8 mV
MDC IDC LEAD LOCATION: 753860
MDC IDC MSMT BATTERY REMAINING LONGEVITY: 97 mo
MDC IDC MSMT LEADCHNL RA IMPEDANCE VALUE: 285 Ohm
MDC IDC MSMT LEADCHNL RA PACING THRESHOLD PULSEWIDTH: 0.4 ms
MDC IDC MSMT LEADCHNL RA SENSING INTR AMPL: 1.25 mV
MDC IDC MSMT LEADCHNL RV PACING THRESHOLD PULSEWIDTH: 0.4 ms
MDC IDC SESS DTM: 20181018150719
MDC IDC STAT BRADY AP VS PERCENT: 98.58 %
MDC IDC STAT BRADY AS VP PERCENT: 0.03 %
MDC IDC STAT BRADY AS VS PERCENT: 1.32 %
MDC IDC STAT BRADY RV PERCENT PACED: 0.11 %

## 2017-02-14 ENCOUNTER — Encounter: Payer: Self-pay | Admitting: Cardiology

## 2017-02-15 ENCOUNTER — Ambulatory Visit: Payer: Medicare HMO | Admitting: Cardiovascular Disease

## 2017-02-15 ENCOUNTER — Ambulatory Visit (INDEPENDENT_AMBULATORY_CARE_PROVIDER_SITE_OTHER): Payer: Medicare HMO | Admitting: Pharmacist Clinician (PhC)/ Clinical Pharmacy Specialist

## 2017-02-15 ENCOUNTER — Encounter: Payer: Self-pay | Admitting: Cardiovascular Disease

## 2017-02-15 VITALS — BP 96/58 | HR 60 | Ht 66.0 in | Wt 157.0 lb

## 2017-02-15 DIAGNOSIS — Z950 Presence of cardiac pacemaker: Secondary | ICD-10-CM

## 2017-02-15 DIAGNOSIS — I272 Pulmonary hypertension, unspecified: Secondary | ICD-10-CM | POA: Diagnosis not present

## 2017-02-15 DIAGNOSIS — I4891 Unspecified atrial fibrillation: Secondary | ICD-10-CM

## 2017-02-15 DIAGNOSIS — R0600 Dyspnea, unspecified: Secondary | ICD-10-CM

## 2017-02-15 DIAGNOSIS — I480 Paroxysmal atrial fibrillation: Secondary | ICD-10-CM | POA: Diagnosis not present

## 2017-02-15 DIAGNOSIS — I495 Sick sinus syndrome: Secondary | ICD-10-CM

## 2017-02-15 DIAGNOSIS — I5032 Chronic diastolic (congestive) heart failure: Secondary | ICD-10-CM

## 2017-02-15 DIAGNOSIS — R0609 Other forms of dyspnea: Secondary | ICD-10-CM | POA: Diagnosis not present

## 2017-02-15 DIAGNOSIS — I952 Hypotension due to drugs: Secondary | ICD-10-CM | POA: Diagnosis not present

## 2017-02-15 DIAGNOSIS — Z7901 Long term (current) use of anticoagulants: Secondary | ICD-10-CM

## 2017-02-15 LAB — POCT INR: INR: 2.3

## 2017-02-15 MED ORDER — AMIODARONE HCL 200 MG PO TABS: ORAL_TABLET | ORAL | 3 refills | Status: DC

## 2017-02-15 MED ORDER — FUROSEMIDE 40 MG PO TABS: 40.0000 mg | ORAL_TABLET | Freq: Every day | ORAL | 3 refills | Status: DC

## 2017-02-15 NOTE — Patient Instructions (Signed)
Medication Instructions:  DECREASE furosemide (Lasix) to 40 mg daily  DECREASE Amiodarone-Alternate taking Amiodarone 200 mg and 100 mg daily  Testing/Procedures: Your physician has requested that you have an echocardiogram. Echocardiography is a painless test that uses sound waves to create images of your heart. It provides your doctor with information about the size and shape of your heart and how well your heart's chambers and valves are working. This procedure takes approximately one hour. There are no restrictions for this procedure. This will be done at our [facility] location:  [place]: Your physician recommends that you schedule a follow-up appointment in: 2-3 months with Dr. Claiborne Billings.   Any Other Special Instructions Will Be Listed Below (If Applicable).     If you need a refill on your cardiac medications before your next appointment, please call your pharmacy.

## 2017-02-15 NOTE — Progress Notes (Signed)
Patient ID: Natasha Fletcher, female   DOB: January 08, 1926, 84 y.o.   MRN: [id number]   HPI: Natasha Fletcher is a 83 y.o. female who presents to the office today for an 8 month follow-up cardiology evaluation.    Natasha Fletcher is status post right total knee replacement in July 2015 and developed postoperative anemia, urinary retention and dehydration.  She underwent red cell transfusion.  In August 2015, she developed acute renal failure was omitted to the emergency room with a creatinine of 7.2, potassium 6.2.  She was hydrated, her ACE inhibitor inhibitor was discontinued and a Foley was placed.  She subsequently developed an episode of presyncope and was found to be in atrial fibrillation with RVR leading to her readmission.  Several days later.  Cardiology consultation was obtained in the hospital on 11/11/2013.  An echo revealed ejection fraction of 65-70% with grade 2 diastolic dysfunction and there was evidence for an intracavity gradient of 16 mm.  She was on anticoagulation and ultimately underwent cardioversion in October 2015.  She was in normal sinus rhythm.  On subsequent follow-up in November 2015.  She saw Natasha Fletcher in March 2016.  When I saw  her in October 2016 her ECG showed atrial flutter with 4:1 conduction.  I recommended an echo Doppler study which showed an EF of 55-60% and evidence for severe LVH.  There was moderate left atrial and mild biatrial dilatation.  I was to see her back in follow-up but I have not seen her since. Since I saw her in December 2016 she was admitted to [facility] hospital with a UTI and was in atrial fibrillation. Cardizem and metoprolol were increased, although she developed significant bradycardia and hypotension leading to subsequent dose reduction. In early February.  She was found to be in atrial fibrillation with rapid ventricular response.  Since her hospitalization, she has been seen by several APPs. She also really was referred for a cardioversion for atrial  fibrillation which was done on 06/04/2015 by Dr. Meda Coffee. She presents now for follow-up evaluation.  She underwent an Medtronic permanent pacemaker implantation for sick sinus syndrome in May 2017.  She has been on chronic anticoagulation with warfarin.  She has had issues with shortness of breath and lower extremity edema necessitating increasing diuretic therapy.   An echo Doppler study on 01/19/2018showed severe LVH with normal systolic function with an EF of 60-65%.  There was grade 2 diastolic dysfunction.  Was moderate tricuspid regurgitation.  She had severe PA pressure increased at 69 mmHg.    Since I last saw her, she has seen Natasha Fletcher in April 2018.  She was having shortness of breath but was not felt to have evidence for heart failure on exam.  Device interrogation showed flat histograms without much are rate variability.  Her blood pressure was stable.  She was recently seen by Natasha Fletcher 01/17/2017 and again had complaints of shortness of breath and fatigue.  She did not appear to be volume overload by physical exam.  Her echo in January 2018 showed an EF of 60-65% with grade 2 diastolic dysfunction, moderate TR, and elevated pulmonary pressures.  She was not having any palpitations.  Her blood pressure was controlled.  She presents now for follow-up evaluation.  Past Medical History:  Diagnosis Date  . Arthritis    "all over"  . Asthma   . Atrial fibrillation ([place])    a. s/p DCCV in 01/2014 b. repeat DCCV in 06/2015, started on Amiodarone at  that time. On Coumadin for anticoagulation.  . Cancer ([place])    rt breast  . Chronic diastolic CHF (congestive heart failure) ([place])    a. 08/2015: Echo with EF of 60-65%, no WMA, LA mod dilated, mild TR.  . Diabetes mellitus without complication ([place])   . Edema leg   . Fast breathing    "because of pill"  . GERD (gastroesophageal reflux disease)   . Glaucoma   . Glaucoma   . Hypertension   . Incontinence   . Pneumonia    h/o  younger  . S/P total knee arthroplasty   . SSS (sick sinus syndrome) ([place])    a. s/p Medtronic Advisa DR MRI [id number] (serial number [id number]) PPM placement in 08/2015  . Swelling    bilateral feet/ legs, more left.  . Thyroid disease    "something years ago"  . UTI (urinary tract infection) 02/12/2015  . Wears dentures   . Wears glasses     Past Surgical History:  Procedure Laterality Date  . ABDOMINAL HYSTERECTOMY    . APPENDECTOMY    . BREAST BIOPSY  04/27/2011   Procedure: BREAST BIOPSY WITH NEEDLE LOCALIZATION;  Surgeon: Edward Jolly, MD;  Location: [facility];  Service: General;  Laterality: Right;  right needle localized breast lumpectomy   . BREAST LUMPECTOMY  04/27/11   right breast by [place]  . CARDIOVERSION N/A 02/01/2014   Procedure: CARDIOVERSION;  Surgeon: Candee Furbish, MD;  Location: [facility] ENDOSCOPY;  Service: Cardiovascular;  Laterality: N/A;  . CARDIOVERSION N/A 06/04/2015   Procedure: CARDIOVERSION;  Surgeon: Dorothy Spark, MD;  Location: [facility];  Service: Cardiovascular;  Laterality: N/A;  . CARDIOVERSION N/A 07/14/2015   Procedure: CARDIOVERSION;  Surgeon: Thayer Headings, MD;  Location: [facility] ENDOSCOPY;  Service: Cardiovascular;  Laterality: N/A;  . CARPAL TUNNEL RELEASE Bilateral   . COLONOSCOPY    . EP IMPLANTABLE DEVICE N/A 08/22/2015   Procedure: Pacemaker Implant;  Surgeon: Will Meredith Leeds, MD;  Medtronic Advisa DR MRI [phone] [id number] (serial number [id number]);  Laterality: N/A;  . EYE SURGERY     both cataracts  . SHOULDER SURGERY Left   . TEE WITHOUT CARDIOVERSION N/A 02/01/2014   Procedure: TRANSESOPHAGEAL ECHOCARDIOGRAM (TEE);  Surgeon: Candee Furbish, MD;  Location: [facility] ENDOSCOPY;  Service: Cardiovascular;  Laterality: N/A;  . TONSILLECTOMY    . TOTAL HIP ARTHROPLASTY Bilateral   . TOTAL KNEE ARTHROPLASTY Right 02/19/2013   Procedure: RIGHT TOTAL KNEE ARTHROPLASTY;  Surgeon: Gearlean Alf, MD;  Location: [facility];  Service:  Orthopedics;  Laterality: Right;  . TOTAL KNEE ARTHROPLASTY Left 10/15/2013   Procedure: LEFT TOTAL KNEE ARTHROPLASTY;  Surgeon: Gearlean Alf, MD;  Location: [facility];  Service: Orthopedics;  Laterality: Left;    Allergies  Allergen Reactions  . Lactose Intolerance (Gi) Other (See Comments)    Stomach pain  . Milk-Related Compounds Other (See Comments)    Upset stomach   . Sulfa Antibiotics Itching    Current Outpatient Medications  Medication Sig Dispense Refill  . amiodarone (PACERONE) 200 MG tablet Alternate taking 200 mg and 100 mg daily. 90 tablet 3  . anastrozole (ARIMIDEX) 1 MG tablet TAKE 1 TABLET BY MOUTH EVERY DAY 30 tablet 2  . carvedilol (COREG) 12.5 MG tablet TAKE 1 TABLET BY MOUTH TWICE A DAY 60 tablet 6  . furosemide (LASIX) 40 MG tablet Take 1 tablet (40 mg total) daily by mouth. 180 tablet 3  . HYDROcodone-acetaminophen (  NORCO) 10-325 MG tablet Take 1 tablet by mouth 2 (two) times daily as needed.  0  . latanoprost (XALATAN) 0.005 % ophthalmic solution Place 1 drop into both eyes at bedtime.    Marland Kitchen LORazepam (ATIVAN) 1 MG tablet Take 1 mg by mouth at bedtime.    Marland Kitchen losartan (COZAAR) 25 MG tablet TAKE 1 TABLET EVERY DAY 30 tablet 6  . Protein (PROCEL 100 PO) Take 1 scoop by mouth 2 (two) times daily.    . traZODone (DESYREL) 50 MG tablet Take 50 mg by mouth at bedtime as needed.   0  . UNABLE TO FIND Med Name: MedPass 120 mL po TID between meals for supplement and nutritional support    . warfarin (COUMADIN) 4 MG tablet Take 1/2 to 1 tablet by mouth daily as directed by coumadin clinic 30 tablet 2  . warfarin (COUMADIN) 4 MG tablet TAKE 1/2 TO 1 TABLET BY MOUTH DAILY AS DIRECTED BY COUMADIN CLINIC 30 tablet 2   No current facility-administered medications for this visit.     Social History   Socioeconomic History  . Marital status: Single    Spouse name: Not on file  . Number of children: Not on file  . Years of education: Not on file  . Highest education level:  Not on file  Social Needs  . Financial resource strain: Not on file  . Food insecurity - worry: Not on file  . Food insecurity - inability: Not on file  . Transportation needs - medical: Not on file  . Transportation needs - non-medical: Not on file  Occupational History  . Not on file  Tobacco Use  . Smoking status: Former Smoker    Packs/day: 0.00    Years: 30.00    Pack years: 0.00    Types: Cigarettes    Last attempt to quit: 04/21/1975    Years since quitting: 41.8  . Smokeless tobacco: Never Used  Substance and Sexual Activity  . Alcohol use: Yes    Comment: occasionally  . Drug use: No  . Sexual activity: Not Currently  Other Topics Concern  . Not on file  Social History Narrative   From SNF, getting PT and using cane.      Family History  Problem Relation Age of Onset  . Heart disease Mother   . Stroke Mother   . Hypertension Mother   . Stroke Father   . Hypertension Father   . Stroke Brother   . Heart attack Neg Hx     ROS General: Negative; No fevers, chills, or night sweats HEENT: Negative; No changes in vision or hearing, sinus congestion, difficulty swallowing Pulmonary: Negative; No cough, wheezing, shortness of breath, hemoptysis Cardiovascular: See HPI:  GI: Negative; No nausea, vomiting, diarrhea, or abdominal pain GU: Negative; No dysuria, hematuria, or difficulty voiding Musculoskeletal: Negative; no myalgias, joint pain, or weakness Hematologic: Negative; no easy bruising, bleeding Endocrine: Negative; no heat/cold intolerance; no diabetes, Neuro: Negative; no changes in balance, headaches Skin: Negative; No rashes or skin lesions Psychiatric: Negative; No behavioral problems, depression Sleep: Negative; No snoring,  daytime sleepiness, hypersomnolence, bruxism, restless legs, hypnogognic hallucinations. Other comprehensive 14 point system review is negative   Physical Exam BP (!) 96/58   Pulse 60   Ht _0  (1.676 m)   Wt 157 lb (71.2  kg)   BMI 25.34 kg/m    Repeat blood pressure by me was 98/62  Wt Readings from Last 3 Encounters:  02/15/17 157 lb (71.2  kg)  01/17/17 154 lb (69.9 kg)  07/22/16 151 lb (68.5 kg)   General: Alert, oriented, no distress.  Skin: normal turgor, no rashes, warm and dry HEENT: Normocephalic, atraumatic. Pupils equal round and reactive to light; sclera anicteric; extraocular muscles intact;  Nose without nasal septal hypertrophy Mouth/Parynx benign; Mallinpatti scale 3 Neck: No JVD, no carotid bruits; normal carotid upstroke Lungs: clear to ausculatation and percussion; no wheezing or rales Chest wall: without tenderness to palpitation Heart: PMI not displaced, RRR, s1 s2 normal, 1/6 systolic murmur, no diastolic murmur, no rubs, gallops, thrills, or heaves Abdomen: soft, nontender; no hepatosplenomehaly, BS+; abdominal aorta nontender and not dilated by palpation. Back: no CVA tenderness Pulses 2+ Musculoskeletal: full range of motion, normal strength, no joint deformities Extremities: no clubbing cyanosis or edema, Homan's sign negative  Neurologic: grossly nonfocal; Cranial nerves grossly wnl Psychologic: Normal mood and affect   ECG (independently read by me): Atrially paced rhythm at 60 bpm.  Prolonged AV conduction with PR interval 266 ms.  QTc interval increased at 508 ms.  March 2018 ECG (independently read by me): Atrially paced rhythm at 60 bpm.  Prolonged AV conduction with PR interval 286 ms.  Nonspecific ST-T changes.  QTc interval increased at 522 ms.  January 2018 ECG (independently read by me): Atrially paced rhythm at 60 bpm.  Prolonged AV conduction with a PR interval at 270 ms.  Nonspecific ST-T changes.  March 2017 ECG (independently read by me): Atrial flutter with a ventricular rate at 87 bpm with variable block.  Nonspecific ST-T changes.  My personal review of her March 2016 ECG shows sinus bradycardia at 58  LABS:  BMP Latest Ref Rng & Units 07/20/2016  06/23/2016 03/22/2016  Glucose 65 - 99 mg/dL 115(H) 93 89  BUN 6 - 20 mg/dL 27(H) 37(H) 30(H)  Creatinine 0.44 - 1.00 mg/dL 1.68(H) 1.70(H) 1.56(H)  Sodium 135 - 145 mmol/L 138 142 139  Potassium 3.5 - 5.1 mmol/L 4.3 4.6 4.2  Chloride 101 - 111 mmol/L 104 105 101  CO2 22 - 32 mmol/L _0 Calcium 8.9 - 10.3 mg/dL 9.0 9.3 9.4    Hepatic Function Latest Ref Rng & Units 10/05/2016 03/22/2016 08/27/2015  Total Protein 6.0 - 8.5 g/dL 6.6 6.9 -  Albumin 3.5 - 4.7 g/dL 3.6 3.9 -  AST 0 - 40 IU/L _1 ALT 0 - 32 IU/L _2 Alk Phosphatase 39 - 117 IU/L 86 87 119  Total Bilirubin 0.0 - 1.2 mg/dL 0.6 0.6 -  Bilirubin, Direct 0.00 - 0.40 mg/dL 0.15 - -    CBC Latest Ref Rng & Units 01/17/2017 07/20/2016 03/22/2016  WBC 3.4 - 10.8 x10E3/uL 5.2 7.0 4.6  Hemoglobin 11.1 - 15.9 g/dL 9.7(L) 9.5(L) 10.4(L)  Hematocrit 34.0 - 46.6 % 32.3(L) 31.0(L) 34.1(L)  Platelets 150 - 379 x10E3/uL 234 221 233   Lab Results  Component Value Date   MCV 80 01/17/2017   MCV 84.2 07/20/2016   MCV 90.2 03/22/2016    Lab Results  Component Value Date   TSH 1.110 10/05/2016    BNP    Component Value Date/Time   BNP 386.4 (H) 03/08/2016 1024    ProBNP    Component Value Date/Time   PROBNP 4,311 (H) 01/17/2017 1438   PROBNP 3,143.0 (H) 11/17/2013 0442    Lipid Panel  No results found for: CHOL, TRIG, HDL, CHOLHDL, VLDL, LDLCALC, LDLDIRECT  INR today 4.7   RADIOLOGY: Dg Chest 2  View  Result Date: 01/20/2017 CLINICAL DATA:  Shortness of breath pre pacemaker insert smoking history chest x-ray of 22,017 EXAM: CHEST  2 VIEW COMPARISON:  None. FINDINGS: Cardiomegaly and mild pulmonary vascular congestion again are noted most likely superimposed on mild chronic interstitial change. Mediastinal and hilar contours are unremarkable. There is a thoracolumbar scoliosis present. Permanent pacemaker is noted. The bones are unremarkable. IMPRESSION: 1. Stable cardiomegaly with perhaps mild pulmonary  vascular congestion superimposed mild chronic change. 2. Thoracolumbar scoliosis. 3. Permanent pacemaker. Electronically Signed   By: Ivar Drape M.D.   On: 01/20/2017 08:12    IMPRESSION:  1. Paroxysmal atrial fibrillation (HCC)   2. Pulmonary hypertension ([place])   3. Pulmonary hypertension, unspecified ([place])   4. Dyspnea on exertion   5. Chronic diastolic CHF (congestive heart failure) ([place])   6. SSS (sick sinus syndrome) ([place])   7. Pacemaker   8. Hypotension due to drugs   9. Long term current use of anticoagulant therapy     ASSESSMENT AND PLAN: Ms. Willingham is an 83 year-old African-American female who has a history of atrial flutter and PAF and underwent insertion of a permanent pacemaker for sick sinus syndrome in May 2017.  She has a history of hypertension, chronic diastolic heart failure and on her most recent echo Doppler study has significant pulmonary artery pressure elevation with an estimated PA pressure at 69 mm.  She admits to shortness of breath most prominent when she walks up steps.. Her ECG continues to show atrially paced rhythm with prolonged AV conduction.  She has had issues with recent dyspnea and has not been found to be in overt CHF.  Her blood pressure today is low.  I have recommended she reduce her furosemide from 40 mg twice a day down to 40 mg daily.  She continues to be on carvedilol 12.5 Mill grams twice a day and very low-dose losartan at 25 mg.  She has diastolic dysfunction which also may be playing a role in her dyspnea.  She has undergone device check by electrophysiology.  With her shortness of breath, and her maintenance of atrial paced rhythm, I am recommending slight reduction of amiodarone from 200 mg daily down to 200 mg alternating with 100 mg every other day.  I have suggested that she follow-up with NatashaCamnitz in 2 months.  I have recommended a follow-up echo Doppler study to reassess her pulmonary artery hypertension.  If she continues to have  significant pulmonary hypertension.  I would consider referring her to Dr. Lubertha Sayres or for pulmonary assessment.  Time spent: 25 minutes Troy Sine, MD, [facility]  02/17/2017 6:24 PM

## 2017-02-17 ENCOUNTER — Encounter: Payer: Self-pay | Admitting: Cardiovascular Disease

## 2017-02-20 ENCOUNTER — Other Ambulatory Visit: Payer: Self-pay | Admitting: Student

## 2017-02-20 DIAGNOSIS — I5032 Chronic diastolic (congestive) heart failure: Secondary | ICD-10-CM

## 2017-02-22 ENCOUNTER — Ambulatory Visit (HOSPITAL_COMMUNITY): Payer: Medicare HMO | Attending: Cardiology

## 2017-02-22 ENCOUNTER — Other Ambulatory Visit: Payer: Self-pay

## 2017-02-22 DIAGNOSIS — I083 Combined rheumatic disorders of mitral, aortic and tricuspid valves: Secondary | ICD-10-CM | POA: Insufficient documentation

## 2017-02-22 DIAGNOSIS — I509 Heart failure, unspecified: Secondary | ICD-10-CM | POA: Insufficient documentation

## 2017-02-22 DIAGNOSIS — E119 Type 2 diabetes mellitus without complications: Secondary | ICD-10-CM | POA: Insufficient documentation

## 2017-02-22 DIAGNOSIS — I480 Paroxysmal atrial fibrillation: Secondary | ICD-10-CM

## 2017-02-22 DIAGNOSIS — I272 Pulmonary hypertension, unspecified: Secondary | ICD-10-CM | POA: Diagnosis not present

## 2017-03-01 ENCOUNTER — Ambulatory Visit: Payer: Medicare HMO | Admitting: Cardiology

## 2017-03-01 ENCOUNTER — Encounter: Payer: Self-pay | Admitting: Cardiology

## 2017-03-01 VITALS — BP 122/60 | HR 61 | Ht 66.0 in | Wt 159.8 lb

## 2017-03-01 DIAGNOSIS — Z79899 Other long term (current) drug therapy: Secondary | ICD-10-CM | POA: Diagnosis not present

## 2017-03-01 DIAGNOSIS — I272 Pulmonary hypertension, unspecified: Secondary | ICD-10-CM | POA: Diagnosis not present

## 2017-03-01 DIAGNOSIS — I5032 Chronic diastolic (congestive) heart failure: Secondary | ICD-10-CM

## 2017-03-01 DIAGNOSIS — I480 Paroxysmal atrial fibrillation: Secondary | ICD-10-CM

## 2017-03-01 DIAGNOSIS — I483 Typical atrial flutter: Secondary | ICD-10-CM

## 2017-03-01 DIAGNOSIS — Z45018 Encounter for adjustment and management of other part of cardiac pacemaker: Secondary | ICD-10-CM | POA: Diagnosis not present

## 2017-03-01 DIAGNOSIS — I5031 Acute diastolic (congestive) heart failure: Secondary | ICD-10-CM

## 2017-03-01 DIAGNOSIS — I495 Sick sinus syndrome: Secondary | ICD-10-CM

## 2017-03-01 LAB — HEPATIC FUNCTION PANEL
ALT: 16 IU/L (ref 0–32)
AST: 22 IU/L (ref 0–40)
Albumin: 4 g/dL (ref 3.5–4.7)
Alkaline Phosphatase: 106 IU/L (ref 39–117)
BILIRUBIN, DIRECT: 0.19 mg/dL (ref 0.00–0.40)
Bilirubin Total: 0.7 mg/dL (ref 0.0–1.2)
Total Protein: 6.7 g/dL (ref 6.0–8.5)

## 2017-03-01 LAB — CUP PACEART INCLINIC DEVICE CHECK
Battery Voltage: 3.02 V
Brady Statistic AP VP Percent: 0.93 %
Brady Statistic AP VS Percent: 0.2 %
Brady Statistic AS VP Percent: 98.11 %
Brady Statistic AS VS Percent: 0.76 %
Brady Statistic RV Percent Paced: 98.85 %
Implantable Lead Implant Date: 20170519
Implantable Lead Implant Date: 20170519
Implantable Lead Location: 753859
Implantable Lead Model: 5076
Implantable Pulse Generator Implant Date: 20170519
Lead Channel Impedance Value: 361 Ohm
Lead Channel Impedance Value: 437 Ohm
Lead Channel Impedance Value: 475 Ohm
Lead Channel Impedance Value: 532 Ohm
Lead Channel Pacing Threshold Amplitude: 0.5 V
Lead Channel Pacing Threshold Amplitude: 0.75 V
Lead Channel Pacing Threshold Pulse Width: 0.4 ms
Lead Channel Sensing Intrinsic Amplitude: 16.625 mV
Lead Channel Setting Pacing Amplitude: 2 V
Lead Channel Setting Pacing Amplitude: 2.5 V
Lead Channel Setting Sensing Sensitivity: 2 mV
MDC IDC LEAD LOCATION: 753860
MDC IDC MSMT BATTERY REMAINING LONGEVITY: 99 mo
MDC IDC MSMT LEADCHNL RA PACING THRESHOLD PULSEWIDTH: 0.4 ms
MDC IDC MSMT LEADCHNL RA SENSING INTR AMPL: 2.5 mV
MDC IDC MSMT LEADCHNL RA SENSING INTR AMPL: 3.375 mV
MDC IDC MSMT LEADCHNL RV SENSING INTR AMPL: 17.75 mV
MDC IDC SESS DTM: 20181127095349
MDC IDC SET LEADCHNL RV PACING PULSEWIDTH: 0.4 ms
MDC IDC STAT BRADY RA PERCENT PACED: 1.12 %

## 2017-03-01 LAB — TSH: TSH: 1.42 u[IU]/mL (ref 0.450–4.500)

## 2017-03-01 MED ORDER — AMIODARONE HCL 200 MG PO TABS
100.0000 mg | ORAL_TABLET | Freq: Every day | ORAL | 2 refills | Status: AC
Start: 2017-03-01 — End: ?

## 2017-03-01 NOTE — Progress Notes (Signed)
Electrophysiology Office Note   Date:  03/01/2017   ID:  Natasha Fletcher, DOB 01/05/1926, MRN [id number]  PCP:  Lorene Dy, MD  Cardiologist:  Claiborne Billings Primary Electrophysiologist:  Carlotta Telfair Meredith Leeds, MD    Chief Complaint  Patient presents with  . Pacemaker Check    Sick sinus      History of Present Illness: Natasha Fletcher is a 83 y.o. female who presents today for electrophysiology evaluation.   She was diagnosed with atrial fibrillation with RVR and 2015 when she was hospitalized. She was found to have an EF of 65-70% with grade 2 diastolic dysfunction and an intracavitary gradient of 16 mmHg. She was anticoagulated and was cardioverted in October 2015. In October 2016, she was found to be in atrial flutter with 4-1 conduction. She had an echocardiogram at that time showed an EF 55-60% and severe LVH. She had moderate biatrial dilation. She was admitted to [facility] December 2016 she was found to be in atrial fibrillation. She was referred for cardioversion on 06/04/15, and is subsequently presented back to her cardiologist clinic in atrial flutter with 4-1 conduction. Had Medtronic pacemaker placed 08/22/15.  Today, denies symptoms of palpitations, chest pain, orthopnea, PND, lower extremity edema, claudication, dizziness, presyncope, syncope, bleeding, or neurologic sequela. The patient is tolerating medications without difficulties.  Her shortness of breath has continued.  She gets short of breath doing just about any activity.  She had a recent echocardiogram that showed severely elevated pulmonary pressures.  Past Medical History:  Diagnosis Date  . Arthritis    "all over"  . Asthma   . Atrial fibrillation ([place])    a. s/p DCCV in 01/2014 b. repeat DCCV in 06/2015, started on Amiodarone at that time. On Coumadin for anticoagulation.  . Cancer ([place])    rt breast  . Chronic diastolic CHF (congestive heart failure) ([place])    a. 08/2015: Echo with EF of 60-65%, no WMA,  LA mod dilated, mild TR.  . Diabetes mellitus without complication ([place])   . Edema leg   . Fast breathing    "because of pill"  . GERD (gastroesophageal reflux disease)   . Glaucoma   . Glaucoma   . Hypertension   . Incontinence   . Pneumonia    h/o younger  . S/P total knee arthroplasty   . SSS (sick sinus syndrome) ([place])    a. s/p Medtronic Advisa DR MRI [id number] (serial number [id number]) PPM placement in 08/2015  . Swelling    bilateral feet/ legs, more left.  . Thyroid disease    "something years ago"  . UTI (urinary tract infection) 02/12/2015  . Wears dentures   . Wears glasses    Past Surgical History:  Procedure Laterality Date  . ABDOMINAL HYSTERECTOMY    . APPENDECTOMY    . BREAST BIOPSY  04/27/2011   Procedure: BREAST BIOPSY WITH NEEDLE LOCALIZATION;  Surgeon: Edward Jolly, MD;  Location: [place];  Service: General;  Laterality: Right;  right needle localized breast lumpectomy   . BREAST LUMPECTOMY  04/27/11   right breast by [place]  . CARDIOVERSION N/A 02/01/2014   Procedure: CARDIOVERSION;  Surgeon: Candee Furbish, MD;  Location: [facility] ENDOSCOPY;  Service: Cardiovascular;  Laterality: N/A;  . CARDIOVERSION N/A 06/04/2015   Procedure: CARDIOVERSION;  Surgeon: Dorothy Spark, MD;  Location: [place];  Service: Cardiovascular;  Laterality: N/A;  . CARDIOVERSION N/A 07/14/2015   Procedure: CARDIOVERSION;  Surgeon: Arnette Norris  Deboraha Sprang, MD;  Location: [facility] ENDOSCOPY;  Service: Cardiovascular;  Laterality: N/A;  . CARPAL TUNNEL RELEASE Bilateral   . COLONOSCOPY    . EP IMPLANTABLE DEVICE N/A 08/22/2015   Procedure: Pacemaker Implant;  Surgeon: Girtrude Enslin Meredith Leeds, MD;  Medtronic Advisa DR MRI [id number] (serial number [id number]);  Laterality: N/A;  . EYE SURGERY     both cataracts  . SHOULDER SURGERY Left   . TEE WITHOUT CARDIOVERSION N/A 02/01/2014   Procedure: TRANSESOPHAGEAL ECHOCARDIOGRAM (TEE);  Surgeon: Candee Furbish, MD;  Location[facility]  ENDOSCOPY;  Service: Cardiovascular;  Laterality: N/A;  . TONSILLECTOMY    . TOTAL HIP ARTHROPLASTY Bilateral   . TOTAL KNEE ARTHROPLASTY Right 02/19/2013   Procedure: RIGHT TOTAL KNEE ARTHROPLASTY;  Surgeon: Gearlean Alf, MD;  Location: [facility] ORS;  Service: Orthopedics;  Laterality: Right;  . TOTAL KNEE ARTHROPLASTY Left 10/15/2013   Procedure: LEFT TOTAL KNEE ARTHROPLASTY;  Surgeon: Gearlean Alf, MD;  Location: [facility] ORS;  Service: Orthopedics;  Laterality: Left;     Current Outpatient Medications  Medication Sig Dispense Refill  . anastrozole (ARIMIDEX) 1 MG tablet TAKE 1 TABLET BY MOUTH EVERY DAY 30 tablet 2  . carvedilol (COREG) 12.5 MG tablet TAKE 1 TABLET BY MOUTH TWICE A DAY 60 tablet 6  . furosemide (LASIX) 20 MG tablet TAKE 2 TABS IN MORNING AND 1 IN EVENING 270 tablet 3  . HYDROcodone-acetaminophen (NORCO) 10-325 MG tablet Take 1 tablet by mouth 2 (two) times daily as needed.  0  . latanoprost (XALATAN) 0.005 % ophthalmic solution Place 1 drop into both eyes at bedtime.    Marland Kitchen LORazepam (ATIVAN) 1 MG tablet Take 1 mg by mouth at bedtime.    Marland Kitchen losartan (COZAAR) 25 MG tablet TAKE 1 TABLET EVERY DAY 30 tablet 6  . Protein (PROCEL 100 PO) Take 1 scoop by mouth 2 (two) times daily.    Marland Kitchen UNABLE TO FIND Med Name: MedPass 120 mL po TID between meals for supplement and nutritional support    . warfarin (COUMADIN) 4 MG tablet Take 1/2 to 1 tablet by mouth daily as directed by coumadin clinic 30 tablet 2  . warfarin (COUMADIN) 4 MG tablet TAKE 1/2 TO 1 TABLET BY MOUTH DAILY AS DIRECTED BY COUMADIN CLINIC 30 tablet 2  . amiodarone (PACERONE) 200 MG tablet Take 0.5 tablets (100 mg total) by mouth daily. 45 tablet 2   No current facility-administered medications for this visit.     Allergies:   Lactose intolerance (gi); Milk-related compounds; and Sulfa antibiotics   Social History:  The patient  reports that she quit smoking about 41 years ago. Her smoking use included cigarettes. She smoked  0.00 packs per day for 30.00 years. she has never used smokeless tobacco. She reports that she drinks alcohol. She reports that she does not use drugs.   Family History:  The patient's family history includes Heart disease in her mother; Hypertension in her father and mother; Stroke in her brother, father, and mother.    ROS:  Please see the history of present illness.   Otherwise, review of systems is positive for SOB.   All other systems are reviewed and negative.   PHYSICAL EXAM: VS:  BP 122/60   Pulse 61   Ht 5\' 6"  (1.676 m)   Wt 159 lb 12.8 oz (72.5 kg)   SpO2 98%   BMI 25.79 kg/m  , BMI Body mass index is 25.79 kg/m. GEN: Well nourished, well developed, in  no acute distress  HEENT: normal  Neck: no JVD, carotid bruits, or masses Cardiac: RRR; no murmurs, rubs, or gallops,no edema  Respiratory:  clear to auscultation bilaterally, normal work of breathing GI: soft, nontender, nondistended, + BS MS: no deformity or atrophy  Skin: warm and dry, device site well healed Neuro:  Strength and sensation are intact Psych: euthymic mood, full affect  EKG:  EKG is not ordered today. Personal review of the ekg ordered 02/15/17 shows A paced, 1 degree AV block  Personal review of the device interrogation today. Results in [place]: 03/08/2016: Brain Natriuretic Peptide 386.4 07/20/2016: BUN 27; Creatinine, Ser 1.68; Potassium 4.3; Sodium 138 10/05/2016: ALT 13; TSH 1.110 01/17/2017: Hemoglobin 9.7; NT-Pro BNP 4,311; Platelets 234    Lipid Panel  No results found for: CHOL, TRIG, HDL, CHOLHDL, VLDL, LDLCALC, LDLDIRECT   Wt Readings from Last 3 Encounters:  03/01/17 159 lb 12.8 oz (72.5 kg)  02/15/17 157 lb (71.2 kg)  01/17/17 154 lb (69.9 kg)      Other studies Reviewed: Additional studies/ records that were reviewed today include: TTE 02/22/17 Review of the above records today demonstrates:  - Left ventricle: The cavity size was normal. There was severe    concentric hypertrophy. Systolic function was vigorous. The   estimated ejection fraction was in the range of 65% to 70%. Wall   motion was normal; there were no regional wall motion   abnormalities. Doppler parameters are consistent with a   reversible restrictive pattern, indicative of decreased left   ventricular diastolic compliance and/or increased left atrial   pressure (grade 3 diastolic dysfunction). - Aortic valve: There was mild regurgitation. - Aortic root: The aortic root was normal in size. - Mitral valve: There was mild regurgitation. - Left atrium: The atrium was mildly dilated. - Right ventricle: The cavity size was moderately dilated. Wall   thickness was normal. Systolic function was moderately reduced. - Right atrium: The atrium was mildly dilated. - Tricuspid valve: There was severe regurgitation. - Pulmonic valve: There was mild regurgitation. - Pulmonary arteries: Systolic pressure was severely increased. PA   peak pressure: 79 mm Hg (S). - Inferior vena cava: The vessel was normal in size. - Pericardium, extracardiac: There was no pericardial effusion.   ASSESSMENT AND PLAN:  1.  Persistent Atrial fibrillation/flutter: On Coumadin.  She is in sinus rhythm now and on amiodarone.  We Natasha Fletcher decrease amiodarone to 100 mg a day as this may help with some of her shortness of breath.  This patients CHA2DS2-VASc Score and unadjusted Ischemic Stroke Rate (% per year) is equal to 7.2 % stroke rate/year from a score of 5  Above score calculated as 1 point each if present [CHF, HTN, DM, Vascular=MI/PAD/Aortic Plaque, Age if 65-74, or Female] Above score calculated as 2 points each if present [Age > 75, or Stroke/TIA/TE]  2. Sick sinus syndrome: Medtronic pacemaker implanted 08/22/15.  Atrial dependent but not much ventricular pacing.  No changes.   3. Pulmonary hypertension: No evidence of heart failure on exam.  Echo with normal ejection fraction.  She does have severe  pulmonary hypertension.  Natasha Fletcher refer to pulmonary hypertension clinic.  4. Hypertension: Well-controlled today.  No changes.  Current medicines are reviewed at length with the patient today.   The patient does not have concerns regarding her medicines.  The following changes were made today: Amiodarone 100 mg daily  Labs/ tests ordered today include:  Orders Placed This Encounter  Procedures  .  TSH  . Hepatic function panel  . CUP PACEART INCLINIC DEVICE CHECK   Disposition:   FU with Natasha Fletcher 6 months  Signed, Natasha Sabala Meredith Leeds, MD  03/01/2017 9:56 AM     [facility] [address] [place] [address] [phone] (office) [phone] (fax)

## 2017-03-01 NOTE — Patient Instructions (Addendum)
Medication Instructions:  Your physician has recommended you make the following change in your medication: 1. DECREASE Amiodarone to 100 mg once daily  *If you need a refill on your cardiac medications before your next appointment, please call your pharmacy*  Labwork: Amiodarone medication surveillance labs today: TSH & Liver function test  Testing/Procedures: None ordered  Follow-Up: You have been referred to Dr. Aundra Dubin in the heart failure clinic to monitor your pulmonary hypertension.  Remote monitoring is used to monitor your Pacemaker or ICD from home. This monitoring reduces the number of office visits required to check your device to one time per year. It allows [place] to keep an eye on the functioning of your device to ensure it is working properly. You are scheduled for a device check from home on 04/21/2017. You may send your transmission at any time that day. If you have a wireless device, the transmission will be sent automatically. After your physician reviews your transmission, you will receive a postcard with your next transmission date.  Your physician wants you to follow-up in: 6 months with Dr. Curt Bears.  You will receive a reminder letter in the mail two months in advance. If you don't receive a letter, please call our office to schedule the follow-up appointment.  Thank you for choosing [facility]!!   Natasha Curet, RN [phone]    Pulmonary Hypertension Pulmonary hypertension is high blood pressure within the arteries in your lungs (pulmonary arteries). It is different than having high blood pressure elsewhere in your body, such as blood pressure that is measured with a blood pressure cuff. Pulmonary hypertension makes it harder for blood to flow through the lungs. As a result, the heart must work harder to pump blood through the lungs, and it may be harder for you to breathe. Over time, this can weaken the heart muscle. Pulmonary hypertension is a serious condition  and it can be fatal. What are the causes? Many different medical conditions can cause pulmonary hypertension. Pulmonary hypertension can be categorized by cause into five groups: Group 1 Pulmonary hypertension that is caused by abnormal growth of small blood vessels in the lungs (pulmonary arterial hypertension). The abnormal blood vessel growth may have no known cause, or it may be:  Passed along from a parent (hereditary).  Caused by another disease, such as a connective tissue disease (including lupus or scleroderma) or HIV.  Caused by certain drugs or toxins.  Group 2 Pulmonary hypertension that is caused by weakness of the main chamber of the heart (left ventricle) or heart valve disease. Group 3 Pulmonary hypertension that is caused by lung disease or low oxygen levels. Causes in this group include:  Emphysema or chronic obstructive pulmonary disease (COPD).  Untreated sleep apnea.  Pulmonary fibrosis.  Group 4 Pulmonary hypertension that is caused by blood clots in the lungs (pulmonary emboli). Group 5 Other causes of pulmonary hypertension, such as sickle cell anemia, or a mix of multiple causes. What are the signs or symptoms? Symptoms of this condition include:  Shortness of breath. You may notice shortness of breath with: ? Activity, such as walking. ? No activity.  Tiredness and fatigue.  Dizziness or fainting.  Rapid heartbeat or feeling your heart flutter or skip a beat (palpitations).  Neck vein enlargement.  Bluish color to your lips and fingertips.  How is this diagnosed? This condition may be diagnosed by:  Chest X-ray.  Arterial blood gases. This test checks the acidity of your blood as well as your  blood oxygen and carbon dioxide levels.  CT scan. This test can provide detailed images of your lungs.  Pulmonary function test. This test measures how much air your lungs can hold. It also tests how well air moves in and out of your  lungs.  Electrocardiogram (ECG). This test traces the electrical activity of your heart.  Echocardiogram. This test is used to look at your heart in motion and check how it is functioning.  Heart catheterization. This test can measure the pressure in your pulmonary artery and the right side of your heart.  Lung biopsy. This procedure involves checking a sample of lung tissue to find underlying causes.  How is this treated? There is no cure for pulmonary hypertension, but treatment can help to relieve symptoms and slow the progress of the condition. Treatment can involve:  Medicines, such as: ? Blood pressure medicines. ? Medicines to relax (dilate) the pulmonary blood vessels. ? Water pills to get rid of extra fluid (diuretic medicines). ? Blood-thinning medicines.  Surgery. For severe pulmonary hypertension that does not respond to medical treatment, heart-lung or lung transplant may be needed.  Follow these instructions at home:  Take medicines only as directed by your health care provider. These include over-the-counter medicines and prescription medicines. Take all medicines exactly as instructed. Do not change or stop medicines without first checking with your health care provider.  Do not smoke. If you need help quitting, ask your health care provider.  Eat a healthy diet.  Limit your salt (sodium) intake to less than 2,300 mg per day.  Stay as active as possible. Exercise as directed by your health care provider. Talk with your health care provider about what type of exercise is safe for you.  Avoid high altitudes.  Avoid hot tubs and saunas.  Avoid becoming pregnant, if this applies. Talk with your health care provider about safe methods of birth control.  Keep all follow-up visits as directed by your health care provider. This is important. Get help right away if:  You have severe shortness of breath.  You develop chest pain or pressure in your chest.  You cough  up blood.  You develop swelling of your feet or legs.  You have a significant increase in weight within 1-2 days. This information is not intended to replace advice given to you by your health care provider. Make sure you discuss any questions you have with your health care provider. Document Released: 01/17/2007 Document Revised: 10/10/2015 Document Reviewed: 09/11/2012 Elsevier Interactive Patient Education  2018 [employer].

## 2017-03-07 ENCOUNTER — Ambulatory Visit: Payer: Medicare HMO | Admitting: Podiatry

## 2017-03-07 DIAGNOSIS — D485 Neoplasm of uncertain behavior of skin: Secondary | ICD-10-CM | POA: Diagnosis not present

## 2017-03-07 DIAGNOSIS — I10 Essential (primary) hypertension: Secondary | ICD-10-CM | POA: Diagnosis not present

## 2017-03-08 DIAGNOSIS — N312 Flaccid neuropathic bladder, not elsewhere classified: Secondary | ICD-10-CM | POA: Diagnosis not present

## 2017-03-21 ENCOUNTER — Ambulatory Visit (INDEPENDENT_AMBULATORY_CARE_PROVIDER_SITE_OTHER): Payer: Medicare HMO | Admitting: Pharmacist

## 2017-03-21 DIAGNOSIS — Z7901 Long term (current) use of anticoagulants: Secondary | ICD-10-CM

## 2017-03-21 DIAGNOSIS — I480 Paroxysmal atrial fibrillation: Secondary | ICD-10-CM | POA: Diagnosis not present

## 2017-03-21 LAB — POCT INR: INR: 3.3

## 2017-03-21 NOTE — Patient Instructions (Signed)
HOLD warfarin dose today, then continue with 1 tablet each Monday and 1/2 tablet all other days.  Repeat INR in 3 weeks  **INCREASE furosemide dose to 2 tables twice daily for 3 days** then back to 2 tablets every monring and 1 every evening.

## 2017-03-30 ENCOUNTER — Telehealth: Payer: Self-pay | Admitting: Cardiovascular Disease

## 2017-03-30 NOTE — Telephone Encounter (Signed)
New message    Patient calling to discuss the foods she should eat/avoid while on Coumadin. Please call   If Home Health RN is calling please get Coumadin Nurse on the phone STAT  1.  Are you calling in regards to an appointment? NO  2.  Are you calling for a refill ? NO  3.  Are you having bleeding issues? NO  4.  Do you need clearance to hold Coumadin? NO   Please route to the Coumadin Clinic Pool

## 2017-03-30 NOTE — Telephone Encounter (Signed)
Reviewed leafy green dietary concerns with patient

## 2017-04-11 ENCOUNTER — Ambulatory Visit (INDEPENDENT_AMBULATORY_CARE_PROVIDER_SITE_OTHER): Payer: Medicare HMO | Admitting: Pharmacist

## 2017-04-11 DIAGNOSIS — Z7901 Long term (current) use of anticoagulants: Secondary | ICD-10-CM | POA: Diagnosis not present

## 2017-04-11 DIAGNOSIS — I480 Paroxysmal atrial fibrillation: Secondary | ICD-10-CM

## 2017-04-11 LAB — POCT INR: INR: 2.7

## 2017-04-15 ENCOUNTER — Other Ambulatory Visit (HOSPITAL_COMMUNITY): Payer: Self-pay

## 2017-04-15 ENCOUNTER — Encounter (HOSPITAL_COMMUNITY): Payer: Self-pay

## 2017-04-15 ENCOUNTER — Encounter (HOSPITAL_COMMUNITY): Payer: Self-pay | Admitting: Cardiology

## 2017-04-15 ENCOUNTER — Other Ambulatory Visit: Payer: Self-pay

## 2017-04-15 ENCOUNTER — Ambulatory Visit (HOSPITAL_COMMUNITY)
Admission: RE | Admit: 2017-04-15 | Discharge: 2017-04-15 | Disposition: A | Discharge status: Home / Self Care | Payer: Medicare HMO | Source: Ambulatory Visit | Attending: Cardiology | Admitting: Cardiology

## 2017-04-15 VITALS — BP 150/66 | HR 66 | Wt 158.0 lb

## 2017-04-15 DIAGNOSIS — I272 Pulmonary hypertension, unspecified: Secondary | ICD-10-CM | POA: Diagnosis not present

## 2017-04-15 DIAGNOSIS — Z7902 Long term (current) use of antithrombotics/antiplatelets: Secondary | ICD-10-CM | POA: Diagnosis not present

## 2017-04-15 DIAGNOSIS — Z853 Personal history of malignant neoplasm of breast: Secondary | ICD-10-CM | POA: Insufficient documentation

## 2017-04-15 DIAGNOSIS — Z9889 Other specified postprocedural states: Secondary | ICD-10-CM | POA: Insufficient documentation

## 2017-04-15 DIAGNOSIS — N183 Chronic kidney disease, stage 3 (moderate): Secondary | ICD-10-CM | POA: Diagnosis not present

## 2017-04-15 DIAGNOSIS — I480 Paroxysmal atrial fibrillation: Secondary | ICD-10-CM

## 2017-04-15 DIAGNOSIS — Z87891 Personal history of nicotine dependence: Secondary | ICD-10-CM | POA: Insufficient documentation

## 2017-04-15 DIAGNOSIS — Z96651 Presence of right artificial knee joint: Secondary | ICD-10-CM | POA: Insufficient documentation

## 2017-04-15 DIAGNOSIS — E1122 Type 2 diabetes mellitus with diabetic chronic kidney disease: Secondary | ICD-10-CM | POA: Diagnosis not present

## 2017-04-15 DIAGNOSIS — Z8249 Family history of ischemic heart disease and other diseases of the circulatory system: Secondary | ICD-10-CM | POA: Insufficient documentation

## 2017-04-15 DIAGNOSIS — I495 Sick sinus syndrome: Secondary | ICD-10-CM | POA: Insufficient documentation

## 2017-04-15 DIAGNOSIS — I5022 Chronic systolic (congestive) heart failure: Secondary | ICD-10-CM

## 2017-04-15 DIAGNOSIS — Z79899 Other long term (current) drug therapy: Secondary | ICD-10-CM | POA: Insufficient documentation

## 2017-04-15 DIAGNOSIS — R0602 Shortness of breath: Secondary | ICD-10-CM

## 2017-04-15 DIAGNOSIS — Z823 Family history of stroke: Secondary | ICD-10-CM | POA: Diagnosis not present

## 2017-04-15 DIAGNOSIS — I5032 Chronic diastolic (congestive) heart failure: Secondary | ICD-10-CM

## 2017-04-15 DIAGNOSIS — I130 Hypertensive heart and chronic kidney disease with heart failure and stage 1 through stage 4 chronic kidney disease, or unspecified chronic kidney disease: Secondary | ICD-10-CM | POA: Diagnosis not present

## 2017-04-15 LAB — BASIC METABOLIC PANEL
ANION GAP: 9 (ref 5–15)
BUN: 27 mg/dL — AB (ref 6–20)
CHLORIDE: 105 mmol/L (ref 101–111)
CO2: 24 mmol/L (ref 22–32)
Calcium: 9.1 mg/dL (ref 8.9–10.3)
Creatinine, Ser: 1.58 mg/dL — ABNORMAL HIGH (ref 0.44–1.00)
GFR calc Af Amer: 32 mL/min — ABNORMAL LOW (ref >60)
GFR calc non Af Amer: 28 mL/min — ABNORMAL LOW (ref >60)
GLUCOSE: 105 mg/dL — AB (ref 65–99)
POTASSIUM: 4.4 mmol/L (ref 3.5–5.1)
Sodium: 138 mmol/L (ref 135–145)

## 2017-04-15 LAB — CBC
HEMATOCRIT: 33.9 % — AB (ref 36.0–46.0)
HEMOGLOBIN: 10 g/dL — AB (ref 12.0–15.0)
MCH: 23.3 pg — AB (ref 26.0–34.0)
MCHC: 29.5 g/dL — AB (ref 30.0–36.0)
MCV: 78.8 fL (ref 78.0–100.0)
Platelets: 197 10*3/uL (ref 150–400)
RBC: 4.3 MIL/uL (ref 3.87–5.11)
RDW: 17.3 % — AB (ref 11.5–15.5)
WBC: 5.5 10*3/uL (ref 4.0–10.5)

## 2017-04-15 LAB — PROTIME-INR
INR: 3.27
PROTHROMBIN TIME: 33 s — AB (ref 11.4–15.2)

## 2017-04-15 MED ORDER — CARVEDILOL 12.5 MG PO TABS
18.7500 mg | ORAL_TABLET | Freq: Two times a day (BID) | ORAL | 6 refills | Status: DC
Start: 2017-04-15 — End: 2017-05-09

## 2017-04-15 NOTE — Patient Instructions (Signed)
Increase Furosemide 60 mg (1.5 tabs) in AM, then 40 mg (1 tab) in PM for 4 days  -Then 40 mg (1 tab) twice a day  Increase Carvedilol 18.75 (1.5 tab), twice a day  Labs drawn today (if we do not call you, then your lab work was stable)   Your physician has requested that you have a cardiac catheterization. Cardiac catheterization is used to diagnose and/or treat various heart conditions. Doctors may recommend this procedure for a number of different reasons. The most common reason is to evaluate chest pain. Chest pain can be a symptom of coronary artery disease (CAD), and cardiac catheterization can show whether plaque is narrowing or blocking your heart's arteries. This procedure is also used to evaluate the valves, as well as measure the blood flow and oxygen levels in different parts of your heart. For further information please visit [email or web address][employer]tn. Please follow instruction sheet, as given.  Your physician recommends that you schedule a follow-up appointment in: 2 weeks with Dr. Aundra Dubin

## 2017-04-16 NOTE — H&P (View-Only) (Signed)
PCP: Dr. Mancel Bale Cardiology: Dr. Claiborne Billings HF Cardiology: Dr. Aundra Dubin  84 yo with history of CKD stage 3, SSS with Medtronic PPM, paroxysmal atrial fibrillation, and chronic diastolic CHF was referred by Dr. Claiborne Billings for evaluation of CHF/pulmonary hypertension.  She has had noticeable exertional dyspnea for over a year. It has worsened gradually. Currently, she is short of breath walking around her house, bathing, dressing, cleaning up house. No chest pain. No orthopnea/PND.  No lightheadedness. She says that she is very limited.  She is in NSR today on amiodarone. She is on warfarin, denies BRBPR/melena.  Most recent echo in 11/18 showed normal EF with severe LVH, restrictive diastolic function, moderately decreased RV systolic function, and severe pulmonary hypertension.   ECG (personally reviewed): a-paced, nonspecific T wave flattening  Labs (9/18): K 4.3, creatinine 1.68 Labs (11/18): TSH normal, LFTs normal, NT-proBNP 4311, hgb 9.7  PMH: 1. CKD stage 3 2. Right breast cancer.  3. Atrial fibrillation: Paroxysmal.  H/o DCCV.  On amiodarone to maintain NSR.   4. Right TKR 2015 5. Sick sinus syndrome: Medtronic PPM 5/17.  6. Type II diabetes 7. HTN 8. Chronic diastolic CHF: Echo (62/69) with EF 65-70%, severe LVH, restrictive diastolic function, mild MR, mild AI, moderately dilated RV with moderately decreased systolic function, PASP 79 mmHg.   Social History   Socioeconomic History  . Marital status: Single    Spouse name: Not on file  . Number of children: Not on file  . Years of education: Not on file  . Highest education level: Not on file  Social Needs  . Financial resource strain: Not on file  . Food insecurity - worry: Not on file  . Food insecurity - inability: Not on file  . Transportation needs - medical: Not on file  . Transportation needs - non-medical: Not on file  Occupational History  . Not on file  Tobacco Use  . Smoking status: Former Smoker    Packs/day: 0.00   Years: 30.00    Pack years: 0.00    Types: Cigarettes    Last attempt to quit: 04/21/1975    Years since quitting: 42.0  . Smokeless tobacco: Never Used  Substance and Sexual Activity  . Alcohol use: Yes    Comment: occasionally  . Drug use: No  . Sexual activity: Not Currently  Other Topics Concern  . Not on file  Social History Narrative   From SNF, getting PT and using cane.     Family History  Problem Relation Age of Onset  . Heart disease Mother   . Stroke Mother   . Hypertension Mother   . Stroke Father   . Hypertension Father   . Stroke Brother   . Heart attack Neg Hx    ROS: All systems reviewed and negative except as per HPI.   Current Outpatient Medications  Medication Sig Dispense Refill  . amiodarone (PACERONE) 200 MG tablet Take 0.5 tablets (100 mg total) by mouth daily. 45 tablet 2  . anastrozole (ARIMIDEX) 1 MG tablet TAKE 1 TABLET BY MOUTH EVERY DAY 30 tablet 2  . carvedilol (COREG) 12.5 MG tablet Take 1.5 tablets (18.75 mg total) by mouth 2 (two) times daily. 90 tablet 6  . furosemide (LASIX) 20 MG tablet TAKE 2 TABS IN MORNING AND 1 IN EVENING (Patient taking differently: Take 20-40 mg by mouth 2 (two) times daily. TAKE 2 TABS IN MORNING AND 1 IN EVENING) 270 tablet 3  . HYDROcodone-acetaminophen (NORCO) 10-325 MG tablet Take 1  tablet by mouth 3 (three) times daily as needed (for pain.).   0  . latanoprost (XALATAN) 0.005 % ophthalmic solution Place 1 drop into both eyes at bedtime.    Marland Kitchen LORazepam (ATIVAN) 1 MG tablet Take 1 mg by mouth at bedtime.    Marland Kitchen losartan (COZAAR) 25 MG tablet TAKE 1 TABLET EVERY DAY 30 tablet 6  . Protein (PROCEL 100 PO) Take 1 scoop by mouth 2 (two) times daily.    Marland Kitchen warfarin (COUMADIN) 4 MG tablet TAKE 1/2 TO 1 TABLET BY MOUTH DAILY AS DIRECTED BY COUMADIN CLINIC (Patient taking differently: TAKE 0.5 TABLET (2 MG) BY MOUTH EVERY EVENING, EXCEPT TAKE 1 TABLET (4 MG) BY MOUTH ON MONDAYS DAILY IN THE EVENING AS DIRECTED BY COUMADIN  CLINIC) 30 tablet 2   No current facility-administered medications for this encounter.    BP (!) 150/66   Pulse 66   Wt 158 lb (71.7 kg)   SpO2 100%   BMI 25.50 kg/m  General: NAD Neck: JVP 9-10 cm, no thyromegaly or thyroid nodule.  Lungs: Clear to auscultation bilaterally with normal respiratory effort. CV: Nondisplaced PMI.  Heart regular S1/S2, no S3/S4, 2/6 high-pitched systolic murmur along there sternal border.  Trace ankle edema.  No carotid bruit.  Normal pedal pulses.  Abdomen: Soft, nontender, no hepatosplenomegaly, no distention.  Skin: Intact without lesions or rashes.  Neurologic: Alert and oriented x 3.  Psych: Normal affect. Extremities: No clubbing or cyanosis.  HEENT: Normal.   Assessment/Plan: 1. Atrial fibrillation: Paroxysmal.  She is in NSR today on amiodarone, denies palpitations.  - Continue warfarin.  - She will continue amiodarone, recent LFTs/TSH normal.  She will need regular eye exam.  2. Chronic diastolic CHF: Echo 76/16 with normal EF, severe LVH, moderate RV systolic function, severe pulmonary hypertension.  Severe LVH in this patient raises concern for either long-standing poorly controlled HTN or cardiac amyloidosis.  I am especially concerned for cardiac amyloidosis, TTR versus AL.  On exam, she is volume overloaded. NYHA class IIIb symptoms.  - Increase Lasix to 60 qam/40 qpm x 4 days then 40 mg bid.   - BMET today and again in 10 days.  - Cannot do cardiac MRI (to assess for amyloidosis) due to PPM.  I will send SPEP and urine immunofixation. If these are negative, I will arrange for PYP scan for TTR amyloidosis.  3. HTN: BP high. I will increase Coreg to 18.75 mg bid.  4. Pulmonary hypertension: Severe pulmonary hypertension on echo.  With severe LVH and restrictive diastolic function, I suspect that [place] is most likely group 2 (pulmonary venous hypertension).   - Given PH and volume overloaded, I do think that a right heart cath would be reasonable.   We discussed risks/benefits and she agreed to proceed.  Will hold warfarin x 2 days prior.  5. CKD: Stage 3. Check BMET today.   Followup 2-3 wks.   Loralie Champagne 04/16/2017

## 2017-04-16 NOTE — Progress Notes (Signed)
PCP: Dr. Mancel Bale Cardiology: Dr. Claiborne Billings HF Cardiology: Dr. Aundra Dubin  84 yo with history of CKD stage 3, SSS with Medtronic PPM, paroxysmal atrial fibrillation, and chronic diastolic CHF was referred by Dr. Claiborne Billings for evaluation of CHF/pulmonary hypertension.  She has had noticeable exertional dyspnea for over a year. It has worsened gradually. Currently, she is short of breath walking around her house, bathing, dressing, cleaning up house. No chest pain. No orthopnea/PND.  No lightheadedness. She says that she is very limited.  She is in NSR today on amiodarone. She is on warfarin, denies BRBPR/melena.  Most recent echo in 11/18 showed normal EF with severe LVH, restrictive diastolic function, moderately decreased RV systolic function, and severe pulmonary hypertension.   ECG (personally reviewed): a-paced, nonspecific T wave flattening  Labs (9/18): K 4.3, creatinine 1.68 Labs (11/18): TSH normal, LFTs normal, NT-proBNP 4311, hgb 9.7  PMH: 1. CKD stage 3 2. Right breast cancer.  3. Atrial fibrillation: Paroxysmal.  H/o DCCV.  On amiodarone to maintain NSR.   4. Right TKR 2015 5. Sick sinus syndrome: Medtronic PPM 5/17.  6. Type II diabetes 7. HTN 8. Chronic diastolic CHF: Echo (38/75) with EF 65-70%, severe LVH, restrictive diastolic function, mild MR, mild AI, moderately dilated RV with moderately decreased systolic function, PASP 79 mmHg.   Social History   Socioeconomic History  . Marital status: Single    Spouse name: Not on file  . Number of children: Not on file  . Years of education: Not on file  . Highest education level: Not on file  Social Needs  . Financial resource strain: Not on file  . Food insecurity - worry: Not on file  . Food insecurity - inability: Not on file  . Transportation needs - medical: Not on file  . Transportation needs - non-medical: Not on file  Occupational History  . Not on file  Tobacco Use  . Smoking status: Former Smoker    Packs/day: 0.00   Years: 30.00    Pack years: 0.00    Types: Cigarettes    Last attempt to quit: 04/21/1975    Years since quitting: 42.0  . Smokeless tobacco: Never Used  Substance and Sexual Activity  . Alcohol use: Yes    Comment: occasionally  . Drug use: No  . Sexual activity: Not Currently  Other Topics Concern  . Not on file  Social History Narrative   From SNF, getting PT and using cane.     Family History  Problem Relation Age of Onset  . Heart disease Mother   . Stroke Mother   . Hypertension Mother   . Stroke Father   . Hypertension Father   . Stroke Brother   . Heart attack Neg Hx    ROS: All systems reviewed and negative except as per HPI.   Current Outpatient Medications  Medication Sig Dispense Refill  . amiodarone (PACERONE) 200 MG tablet Take 0.5 tablets (100 mg total) by mouth daily. 45 tablet 2  . anastrozole (ARIMIDEX) 1 MG tablet TAKE 1 TABLET BY MOUTH EVERY DAY 30 tablet 2  . carvedilol (COREG) 12.5 MG tablet Take 1.5 tablets (18.75 mg total) by mouth 2 (two) times daily. 90 tablet 6  . furosemide (LASIX) 20 MG tablet TAKE 2 TABS IN MORNING AND 1 IN EVENING (Patient taking differently: Take 20-40 mg by mouth 2 (two) times daily. TAKE 2 TABS IN MORNING AND 1 IN EVENING) 270 tablet 3  . HYDROcodone-acetaminophen (NORCO) 10-325 MG tablet Take 1  tablet by mouth 3 (three) times daily as needed (for pain.).   0  . latanoprost (XALATAN) 0.005 % ophthalmic solution Place 1 drop into both eyes at bedtime.    Marland Kitchen LORazepam (ATIVAN) 1 MG tablet Take 1 mg by mouth at bedtime.    Marland Kitchen losartan (COZAAR) 25 MG tablet TAKE 1 TABLET EVERY DAY 30 tablet 6  . Protein (PROCEL 100 PO) Take 1 scoop by mouth 2 (two) times daily.    Marland Kitchen warfarin (COUMADIN) 4 MG tablet TAKE 1/2 TO 1 TABLET BY MOUTH DAILY AS DIRECTED BY COUMADIN CLINIC (Patient taking differently: TAKE 0.5 TABLET (2 MG) BY MOUTH EVERY EVENING, EXCEPT TAKE 1 TABLET (4 MG) BY MOUTH ON MONDAYS DAILY IN THE EVENING AS DIRECTED BY COUMADIN  CLINIC) 30 tablet 2   No current facility-administered medications for this encounter.    BP (!) 150/66   Pulse 66   Wt 158 lb (71.7 kg)   SpO2 100%   BMI 25.50 kg/m  General: NAD Neck: JVP 9-10 cm, no thyromegaly or thyroid nodule.  Lungs: Clear to auscultation bilaterally with normal respiratory effort. CV: Nondisplaced PMI.  Heart regular S1/S2, no S3/S4, 2/6 high-pitched systolic murmur along there sternal border.  Trace ankle edema.  No carotid bruit.  Normal pedal pulses.  Abdomen: Soft, nontender, no hepatosplenomegaly, no distention.  Skin: Intact without lesions or rashes.  Neurologic: Alert and oriented x 3.  Psych: Normal affect. Extremities: No clubbing or cyanosis.  HEENT: Normal.   Assessment/Plan: 1. Atrial fibrillation: Paroxysmal.  She is in NSR today on amiodarone, denies palpitations.  - Continue warfarin.  - She will continue amiodarone, recent LFTs/TSH normal.  She will need regular eye exam.  2. Chronic diastolic CHF: Echo 63/01 with normal EF, severe LVH, moderate RV systolic function, severe pulmonary hypertension.  Severe LVH in this patient raises concern for either long-standing poorly controlled HTN or cardiac amyloidosis.  I am especially concerned for cardiac amyloidosis, TTR versus AL.  On exam, she is volume overloaded. NYHA class IIIb symptoms.  - Increase Lasix to 60 qam/40 qpm x 4 days then 40 mg bid.   - BMET today and again in 10 days.  - Cannot do cardiac MRI (to assess for amyloidosis) due to PPM.  I will send SPEP and urine immunofixation. If these are negative, I will arrange for PYP scan for TTR amyloidosis.  3. HTN: BP high. I will increase Coreg to 18.75 mg bid.  4. Pulmonary hypertension: Severe pulmonary hypertension on echo.  With severe LVH and restrictive diastolic function, I suspect that [place] is most likely group 2 (pulmonary venous hypertension).   - Given PH and volume overloaded, I do think that a right heart cath would be reasonable.   We discussed risks/benefits and she agreed to proceed.  Will hold warfarin x 2 days prior.  5. CKD: Stage 3. Check BMET today.   Followup 2-3 wks.   Loralie Champagne 04/16/2017

## 2017-04-18 ENCOUNTER — Telehealth (HOSPITAL_COMMUNITY): Payer: Self-pay | Admitting: Pharmacist

## 2017-04-18 ENCOUNTER — Telehealth (HOSPITAL_COMMUNITY): Payer: Self-pay | Admitting: Surgery

## 2017-04-18 ENCOUNTER — Telehealth: Payer: Self-pay | Admitting: Licensed Clinical Social Worker

## 2017-04-18 NOTE — Telephone Encounter (Signed)
Patient referred for HF [facility] Program secondary to medication confusion.  I have sent all appropriate paperwork via secure email to paramedic team.

## 2017-04-18 NOTE — Telephone Encounter (Signed)
Spent time reviewing Ms. Kunzman medications with her since she was very confused about how to take them. She read all of the directions back to me correctly and was grateful for the assistance. Will see if we can get paramedicine involved to assist her.   Ruta Hinds. Velva Harman, PharmD, BCPS, CPP Clinical Pharmacist Phone: [phone] 04/18/2017 10:31 AM

## 2017-04-18 NOTE — Telephone Encounter (Signed)
CSW received referral to discuss [employer] program. Patient was seen in the clinic on Friday and the RN was concerned about knowledge of medications and pre filling med box. Patient reports her brother assists with her needs but she lives alone. Patient welcomed the assistance of the Paramedicine program and looking forward to a visit. CSW will continue to follow and support patient through the [facility] [place] [facility], [place]

## 2017-04-19 ENCOUNTER — Telehealth (HOSPITAL_COMMUNITY): Payer: Self-pay

## 2017-04-19 ENCOUNTER — Telehealth (HOSPITAL_COMMUNITY): Payer: Self-pay | Admitting: *Deleted

## 2017-04-19 DIAGNOSIS — N312 Flaccid neuropathic bladder, not elsewhere classified: Secondary | ICD-10-CM | POA: Diagnosis not present

## 2017-04-19 NOTE — Telephone Encounter (Signed)
Pt is a new referral to paramedicine program- I called pt to sch an appointment with her-she has a heart cath sch this Thursday and isnt sure if she is going to stay overnight or not for a visit on Friday so I will contact her on Friday and f/u and if she is home then I will come see her then. She seems very excited about the program.   Marylouise Stacks, EMT-Paramedic  04/19/17

## 2017-04-19 NOTE — Telephone Encounter (Signed)
Advanced Heart Failure Triage Encounter  [name]  Date of Call: 04/19/17  Problem: [place] Question  Patient was nervous about her upcoming [place] and just wanted to ask a few questions about the procedure.   Plan:  Instructions reviewed with patient again and she was very appreciated and she feels better about it.  No further questions.   Darron Doom, RN

## 2017-04-21 ENCOUNTER — Ambulatory Visit (HOSPITAL_COMMUNITY)
Admission: RE | Admit: 2017-04-21 | Discharge: 2017-04-21 | Disposition: A | Discharge status: Home / Self Care | Payer: Medicare HMO | Source: Ambulatory Visit | Attending: Cardiology | Admitting: Cardiology

## 2017-04-21 ENCOUNTER — Encounter (HOSPITAL_COMMUNITY): Payer: Self-pay | Admitting: Cardiology

## 2017-04-21 ENCOUNTER — Encounter (HOSPITAL_COMMUNITY)
Admission: RE | Disposition: A | Discharge status: Home / Self Care | Payer: Self-pay | Source: Ambulatory Visit | Attending: Cardiology

## 2017-04-21 ENCOUNTER — Encounter: Payer: Medicare HMO | Admitting: *Deleted

## 2017-04-21 ENCOUNTER — Telehealth: Payer: Self-pay | Admitting: Cardiology

## 2017-04-21 DIAGNOSIS — Z950 Presence of cardiac pacemaker: Secondary | ICD-10-CM | POA: Diagnosis not present

## 2017-04-21 DIAGNOSIS — Z853 Personal history of malignant neoplasm of breast: Secondary | ICD-10-CM | POA: Insufficient documentation

## 2017-04-21 DIAGNOSIS — I130 Hypertensive heart and chronic kidney disease with heart failure and stage 1 through stage 4 chronic kidney disease, or unspecified chronic kidney disease: Secondary | ICD-10-CM | POA: Insufficient documentation

## 2017-04-21 DIAGNOSIS — Z87891 Personal history of nicotine dependence: Secondary | ICD-10-CM | POA: Diagnosis not present

## 2017-04-21 DIAGNOSIS — Z96651 Presence of right artificial knee joint: Secondary | ICD-10-CM | POA: Insufficient documentation

## 2017-04-21 DIAGNOSIS — I272 Pulmonary hypertension, unspecified: Secondary | ICD-10-CM | POA: Insufficient documentation

## 2017-04-21 DIAGNOSIS — I495 Sick sinus syndrome: Secondary | ICD-10-CM | POA: Insufficient documentation

## 2017-04-21 DIAGNOSIS — Z7901 Long term (current) use of anticoagulants: Secondary | ICD-10-CM | POA: Insufficient documentation

## 2017-04-21 DIAGNOSIS — I480 Paroxysmal atrial fibrillation: Secondary | ICD-10-CM | POA: Diagnosis not present

## 2017-04-21 DIAGNOSIS — I5022 Chronic systolic (congestive) heart failure: Secondary | ICD-10-CM

## 2017-04-21 DIAGNOSIS — I5032 Chronic diastolic (congestive) heart failure: Secondary | ICD-10-CM | POA: Insufficient documentation

## 2017-04-21 DIAGNOSIS — Z79899 Other long term (current) drug therapy: Secondary | ICD-10-CM | POA: Insufficient documentation

## 2017-04-21 DIAGNOSIS — N183 Chronic kidney disease, stage 3 (moderate): Secondary | ICD-10-CM | POA: Insufficient documentation

## 2017-04-21 DIAGNOSIS — I509 Heart failure, unspecified: Secondary | ICD-10-CM | POA: Diagnosis not present

## 2017-04-21 HISTORY — PX: RIGHT HEART CATH: CATH118263

## 2017-04-21 LAB — POCT I-STAT 3, VENOUS BLOOD GAS (G3P V)
Acid-base deficit: 1 mmol/L (ref 0.0–2.0)
Bicarbonate: 23.5 mmol/L (ref 20.0–28.0)
O2 SAT: 55 %
PCO2 VEN: 37.2 mmHg — AB (ref 44.0–60.0)
PO2 VEN: 29 mmHg — AB (ref 32.0–45.0)
TCO2: 25 mmol/L (ref 22–32)
pH, Ven: 7.408 (ref 7.250–7.430)

## 2017-04-21 LAB — PROTIME-INR
INR: 4.13 — AB
PROTHROMBIN TIME: 39.7 s — AB (ref 11.4–15.2)

## 2017-04-21 SURGERY — RIGHT HEART CATH
Anesthesia: LOCAL

## 2017-04-21 MED ORDER — HEPARIN (PORCINE) IN NACL 2-0.9 UNIT/ML-% IJ SOLN
INTRAMUSCULAR | Status: AC
  Filled 2017-04-21: qty 500

## 2017-04-21 MED ORDER — SODIUM CHLORIDE 0.9 % IV SOLN
INTRAVENOUS | Status: DC
  Administered 2017-04-21: 06:00:00 via INTRAVENOUS

## 2017-04-21 MED ORDER — SODIUM CHLORIDE 0.9 % IV SOLN: 250.0000 mL | INTRAVENOUS | Status: DC | PRN

## 2017-04-21 MED ORDER — SODIUM CHLORIDE 0.9% FLUSH: 3.0000 mL | INTRAVENOUS | Status: DC | PRN

## 2017-04-21 MED ORDER — ASPIRIN 81 MG PO CHEW
CHEWABLE_TABLET | ORAL | Status: AC
  Administered 2017-04-21: 81 mg via ORAL
  Filled 2017-04-21: qty 1

## 2017-04-21 MED ORDER — ACETAMINOPHEN 325 MG PO TABS: 650.0000 mg | ORAL_TABLET | ORAL | Status: DC | PRN

## 2017-04-21 MED ORDER — LIDOCAINE HCL (PF) 1 % IJ SOLN
INTRAMUSCULAR | Status: AC
  Filled 2017-04-21: qty 30

## 2017-04-21 MED ORDER — SODIUM CHLORIDE 0.9% FLUSH: 3.0000 mL | Freq: Two times a day (BID) | INTRAVENOUS | Status: DC

## 2017-04-21 MED ORDER — LIDOCAINE HCL (PF) 1 % IJ SOLN
INTRAMUSCULAR | Status: DC | PRN
  Administered 2017-04-21: 2 mL

## 2017-04-21 MED ORDER — ASPIRIN 81 MG PO CHEW
81.0000 mg | CHEWABLE_TABLET | ORAL | Status: AC
  Administered 2017-04-21: 81 mg via ORAL

## 2017-04-21 MED ORDER — ONDANSETRON HCL 4 MG/2ML IJ SOLN: 4.0000 mg | Freq: Four times a day (QID) | INTRAMUSCULAR | Status: DC | PRN

## 2017-04-21 MED ORDER — HEPARIN (PORCINE) IN NACL 2-0.9 UNIT/ML-% IJ SOLN
INTRAMUSCULAR | Status: AC | PRN
  Administered 2017-04-21: 500 mL via INTRA_ARTERIAL

## 2017-04-21 SURGICAL SUPPLY — 7 items
CATH BALLN WEDGE 5F 110CM (CATHETERS) ×2 IMPLANT
GUIDEWIRE .025 260CM (WIRE) IMPLANT
PACK CARDIAC CATHETERIZATION (CUSTOM PROCEDURE TRAY) ×2 IMPLANT
SHEATH GLIDE SLENDER 4/5FR (SHEATH) ×2 IMPLANT
TRANSDUCER W/STOPCOCK (MISCELLANEOUS) ×2 IMPLANT
TUBING CIL FLEX 10 FLL-RA (TUBING) ×2 IMPLANT
WIRE EMERALD 3MM-J .025X260CM (WIRE) ×2 IMPLANT

## 2017-04-21 NOTE — Interval H&P Note (Signed)
History and Physical Interval Note:  04/21/2017 7:39 AM  Natasha Fletcher  has presented today for surgery, with the diagnosis of chf  The various methods of treatment have been discussed with the patient and family. After consideration of risks, benefits and other options for treatment, the patient has consented to  Procedure(s): RIGHT HEART CATH (N/A) as a surgical intervention .  The patient's history has been reviewed, patient examined, no change in status, stable for surgery.  I have reviewed the patient's chart and labs.  Questions were answered to the patient's satisfaction.     [place] [employer]

## 2017-04-21 NOTE — Discharge Instructions (Signed)
Brachial Site Care Refer to this sheet in the next few weeks. These instructions provide you with information about caring for yourself after your procedure. Your health care provider may also give you more specific instructions. Your treatment has been planned according to current medical practices, but problems sometimes occur. Call your health care provider if you have any problems or questions after your procedure. What can I expect after the procedure? After your procedure, it is typical to have the following:  Bruising at the brachial site that usually fades within 1-2 weeks.  Follow these instructions at home:  Take medicines only as directed by your health care provider.  You may shower 24 hours after the procedure or as directed by your health care provider. Remove the bandage (dressing) and gently wash the site with plain soap and water. Pat the area dry with a clean towel. Do not rub the site, because this may cause bleeding.  Do not take baths, swim, or use a hot tub until your health care provider approves.  Check your insertion site every day for redness, swelling, or drainage.  Do not apply powder or lotion to the site.  Do not push or pull heavy objects with the affected arm for 24 hours or as directed by your health care provider.  Do not lift over 10 lb (4.5 kg) for 5 days after your procedure or as directed by your health care provider.  Ask your health care provider when it is okay to: ? Return to work or school. ? Resume usual physical activities or sports. ? Resume sexual activity.  Do not drive home if you are discharged the same day as the procedure. Have someone else drive you.  You may drive 24 hours after the procedure unless otherwise instructed by your health care provider.  Do not operate machinery or power tools for 24 hours after the procedure.  If your procedure was done as an outpatient procedure, which [place] that you went home the same day as your  procedure, a responsible adult should be with you for the first 24 hours after you arrive home.  Keep all follow-up visits as directed by your health care provider. This is important. Contact a health care provider if:  You have a fever.  You have chills.  You have increased bleeding from the brachial site. Hold pressure on the site. Get help right away if:  You have unusual pain at the brachial site.  You have redness, warmth, or swelling at the brachial site.  This information is not intended to replace advice given to you by your health care provider. Make sure you discuss any questions you have with your health care provider. Document Released: 04/24/2010 Document Revised: 08/28/2015 Document Reviewed: 10/08/2013 Elsevier Interactive Patient Education  2018 [employer].

## 2017-04-21 NOTE — Interval H&P Note (Signed)
History and Physical Interval Note:  04/21/2017 7:39 AM  Natasha Fletcher  has presented today for surgery, with the diagnosis of chf  The various methods of treatment have been discussed with the patient and family. After consideration of risks, benefits and other options for treatment, the patient has consented to  Procedure(s): RIGHT HEART CATH (N/A) as a surgical intervention .  The patient's history has been reviewed, patient examined, no change in status, stable for surgery.  I have reviewed the patient's chart and labs.  Questions were answered to the patient's satisfaction.     Kiano Terrien [employer]

## 2017-04-21 NOTE — Telephone Encounter (Signed)
New message     1. Has your device fired? NO  2. Is you device beeping? NO  3. Are you experiencing draining or swelling at device site? NO 4. Are you calling to see if we received your device transmission? Patient wants assistance with sending transmission.  5. Have you passed out? no   Please route to [place]

## 2017-04-21 NOTE — Telephone Encounter (Signed)
Called patient and attempted to walk her through how to send a remote transmission. Patient states that she just returned home from the hospital and was having difficulty following instructions. She asked if she could send her remote transmission tomorrow as she felt very confused and was having trouble understanding my directions.  I confirmed that she could. Patient will call back tomorrow for assistance.

## 2017-04-22 ENCOUNTER — Other Ambulatory Visit (HOSPITAL_COMMUNITY): Payer: Self-pay | Admitting: Pharmacist

## 2017-04-22 ENCOUNTER — Other Ambulatory Visit (HOSPITAL_COMMUNITY): Payer: Self-pay

## 2017-04-22 ENCOUNTER — Encounter: Payer: Self-pay | Admitting: Cardiology

## 2017-04-22 DIAGNOSIS — R3982 Chronic bladder pain: Secondary | ICD-10-CM | POA: Diagnosis not present

## 2017-04-22 DIAGNOSIS — N312 Flaccid neuropathic bladder, not elsewhere classified: Secondary | ICD-10-CM | POA: Diagnosis not present

## 2017-04-22 DIAGNOSIS — R300 Dysuria: Secondary | ICD-10-CM | POA: Diagnosis not present

## 2017-04-22 NOTE — Progress Notes (Signed)
Paramedicine Encounter    Patient ID: Natasha Fletcher, female    DOB: 1926-02-09, 84 y.o.   MRN: [id number]   Patient Care Team: Lorene Dy, MD as PCP - General (Internal Medicine) Troy Sine, MD as PCP - Cardiology (Cardiology) Constance Haw, MD as Consulting Physician (Cardiology)  Patient Active Problem List   Diagnosis Date Noted  . Atrial fibrillation ([place]) 03/22/2016  . Bradycardia 08/22/2015  . Junctional (nodal) bradycardia 08/22/2015  . Malnutrition of moderate degree 03/19/2015  . Benign essential HTN   . Bradycardia, drug induced 03/18/2015  . Hypokalemia 02/16/2015  . Thrombocytopenia ([place]) 02/16/2015  . Sepsis due to urinary tract infection ([place])   . Sepsis ([place]) 02/12/2015  . Urinary tract infectious disease   . Long-term (current) use of anticoagulants 07/12/2014  . Chronic anticoagulation 02/11/2014  . Hypertensive cardiovascular disease 01/24/2014  . Chronic diastolic CHF (congestive heart failure) ([place]) 01/24/2014  . Anemia 01/24/2014  . Breast cancer of upper-inner quadrant of right female breast ([place]) 01/24/2014  . Acute diastolic heart failure ([place]) 11/11/2013  . Atrial flutter ([place]) 11/11/2013  . PAF (paroxysmal atrial fibrillation) ([place]) 11/10/2013  . Slurred speech 11/10/2013  . Protein-calorie malnutrition, severe ([place]) 11/06/2013  . Coffee ground emesis 11/05/2013  . Dehydration 10/19/2013  . Urinary retention 10/18/2013  . GERD (gastroesophageal reflux disease) 03/20/2013  . S/P total knee arthroplasty   . UTI (urinary tract infection) 02/21/2013  . OA (osteoarthritis) of knee 02/19/2013  . Hypertension   . Thyroid disease   . Cancer of central portion of female breast ([place]) 04/14/2011    Current Outpatient Medications:  .  amiodarone (PACERONE) 200 MG tablet, Take 0.5 tablets (100 mg total) by mouth daily., Disp: 45 tablet, Rfl: 2 .  anastrozole (ARIMIDEX) 1 MG tablet, TAKE 1 TABLET BY MOUTH EVERY DAY, Disp: 30 tablet, Rfl:  2 .  carvedilol (COREG) 12.5 MG tablet, Take 1.5 tablets (18.75 mg total) by mouth 2 (two) times daily., Disp: 90 tablet, Rfl: 6 .  furosemide (LASIX) 20 MG tablet, Take 40 mg by mouth 2 (two) times daily., Disp: , Rfl:  .  HYDROcodone-acetaminophen (NORCO) 10-325 MG tablet, Take 1 tablet by mouth 3 (three) times daily as needed (for pain.). , Disp: , Rfl: 0 .  latanoprost (XALATAN) 0.005 % ophthalmic solution, Place 1 drop into both eyes at bedtime., Disp: , Rfl:  .  LORazepam (ATIVAN) 1 MG tablet, Take 1 mg by mouth at bedtime., Disp: , Rfl:  .  losartan (COZAAR) 25 MG tablet, TAKE 1 TABLET EVERY DAY, Disp: 30 tablet, Rfl: 6 .  warfarin (COUMADIN) 4 MG tablet, TAKE 1/2 TO 1 TABLET BY MOUTH DAILY AS DIRECTED BY COUMADIN CLINIC (Patient taking differently: TAKE 0.5 TABLET (2 MG) BY MOUTH EVERY EVENING, EXCEPT TAKE 1 TABLET (4 MG) BY MOUTH ON MONDAYS DAILY IN THE EVENING AS DIRECTED BY COUMADIN CLINIC), Disp: 30 tablet, Rfl: 2 .  Protein (PROCEL 100 PO), Take 1 scoop by mouth 2 (two) times daily., Disp: , Rfl:  Allergies  Allergen Reactions  . Lactose Intolerance (Gi) Other (See Comments)    Stomach pain  . Milk-Related Compounds Other (See Comments)    Upset stomach   . Sulfa Antibiotics Itching     Social History   Socioeconomic History  . Marital status: Single    Spouse name: Not on file  . Number of children: Not on file  . Years of education: Not on file  . Highest education level: Not on  file  Social Needs  . Financial resource strain: Not on file  . Food insecurity - worry: Not on file  . Food insecurity - inability: Not on file  . Transportation needs - medical: Not on file  . Transportation needs - non-medical: Not on file  Occupational History  . Not on file  Tobacco Use  . Smoking status: Former Smoker    Packs/day: 0.00    Years: 30.00    Pack years: 0.00    Types: Cigarettes    Last attempt to quit: 04/21/1975    Years since quitting: 42.0  . Smokeless tobacco:  Never Used  Substance and Sexual Activity  . Alcohol use: Yes    Comment: occasionally  . Drug use: No  . Sexual activity: Not Currently  Other Topics Concern  . Not on file  Social History Narrative   From SNF, getting PT and using cane.      Physical Exam      Future Appointments  Date Time Provider [place]  05/09/2017 10:00 AM Larey Dresser, MD [facility] None  05/10/2017  9:40 AM Troy Sine, MD [facility]  05/10/2017 10:00 AM [facility]  06/09/2017  1:30 PM Causey, Charlestine Massed, NP [facility] None   BP 124/60   Pulse 62   Resp 16   Wt 158 lb (71.7 kg)   SpO2 98%   BMI 27.12 kg/m    This is the first visit with the pt in the home, she had heart cath sch yesterday but unable to complete it due to her blood being too thin and then this morning she has felt more sob than usual. She is normally able to do a few things around the house and has sob on a daily but not this bad.  Her mean of transportation is her brother.  She doesn't weigh daily. She weighed yesterday when she left and it was 151--?? I didn't see that in epic anywhere-- When she weighed this morning during our visit and it was 158 and that short distance from chair to scales and back she got obvious sob-- She does not have home health or any other services coming to visit her-lives alone at independent apt.  She took her meds this morning.  [facility]-meds are affordable for her-some of those she has no co-pay.  --she takes a whole tab of amio vs the half that is listed in epic --she needs anastrozole refilled- --she is taking her carvedilol only 1 tab BID not the 1.5 tab BID as noted in epic -clarified with Doroteo Bradford about the correct dose is in Epic and also contacted the coumadin clinic and they advised for her to skip today dose and resume the whole tab on Monday and half on the other days of week. She did let me fill a pill box  for her.  --she is out of her lorazepam she has taken more than one at times and its too soon to refill. She states she isnt sleeping at night and needs something-she used to take melatonin and will get her brother to get it again for her.  She only urinated once last night where she normally uses it multiple times even with her foley bag--she said last night her urine was very dark but this morning she reports her urine was clear this morning. She gets pain to her lower flank area.  She states she had some abd pain to lower  portion of her abd-dull constant pain that lasted approx 59min and it went away on its own. [facility] Contacted the urologist and they are able to see her today at 3.  She does eat a lot of fast food. Educated her on better food choices. Her brother does her shopping for her-so I advised her to tell him to get low sodium foods and to cut back on the take out foods.  She states earlier on the phone she felt confused about things-her brother told her he noticed things a few years ago but she doesn't feel like its been that long. Contacted pharmacy and her new dose of carvedilol is ready at pharmacy for pick up. The pharmacy also advised she does not have any anastrozole on file. Will f/u with her on Monday.   ACTION: Home visit completed  Marylouise Stacks, EMT-Paramedic 04/22/17

## 2017-04-26 ENCOUNTER — Telehealth (HOSPITAL_COMMUNITY): Payer: Self-pay | Admitting: Surgery

## 2017-04-26 ENCOUNTER — Other Ambulatory Visit (HOSPITAL_COMMUNITY): Payer: Self-pay

## 2017-04-26 NOTE — Progress Notes (Signed)
Paramedicine Encounter    Patient ID: Natasha Fletcher, female    DOB: 02/28/1926, 84 y.o.   MRN: [id number]   Patient Care Team: Lorene Dy, MD as PCP - General (Internal Medicine) Troy Sine, MD as PCP - Cardiology (Cardiology) Constance Haw, MD as Consulting Physician (Cardiology)  Patient Active Problem List   Diagnosis Date Noted  . Atrial fibrillation ([place]) 03/22/2016  . Bradycardia 08/22/2015  . Junctional (nodal) bradycardia 08/22/2015  . Malnutrition of moderate degree 03/19/2015  . Benign essential HTN   . Bradycardia, drug induced 03/18/2015  . Hypokalemia 02/16/2015  . Thrombocytopenia ([place]) 02/16/2015  . Sepsis due to urinary tract infection ([place])   . Sepsis ([place]) 02/12/2015  . Urinary tract infectious disease   . Long-term (current) use of anticoagulants 07/12/2014  . Chronic anticoagulation 02/11/2014  . Hypertensive cardiovascular disease 01/24/2014  . Chronic diastolic CHF (congestive heart failure) ([place]) 01/24/2014  . Anemia 01/24/2014  . Breast cancer of upper-inner quadrant of right female breast ([place]) 01/24/2014  . Acute diastolic heart failure ([place]) 11/11/2013  . Atrial flutter ([place]) 11/11/2013  . PAF (paroxysmal atrial fibrillation) ([place]) 11/10/2013  . Slurred speech 11/10/2013  . Protein-calorie malnutrition, severe ([place]) 11/06/2013  . Coffee ground emesis 11/05/2013  . Dehydration 10/19/2013  . Urinary retention 10/18/2013  . GERD (gastroesophageal reflux disease) 03/20/2013  . S/P total knee arthroplasty   . UTI (urinary tract infection) 02/21/2013  . OA (osteoarthritis) of knee 02/19/2013  . Hypertension   . Thyroid disease   . Cancer of central portion of female breast ([place]) 04/14/2011    Current Outpatient Medications:  .  amiodarone (PACERONE) 200 MG tablet, Take 0.5 tablets (100 mg total) by mouth daily., Disp: 45 tablet, Rfl: 2 .  carvedilol (COREG) 12.5 MG tablet, Take 1.5 tablets (18.75 mg total) by mouth 2 (two) times  daily., Disp: 90 tablet, Rfl: 6 .  cephALEXin (KEFLEX) 500 MG capsule, Take 500 mg by mouth 2 (two) times daily., Disp: , Rfl:  .  furosemide (LASIX) 20 MG tablet, Take 40 mg by mouth 2 (two) times daily., Disp: , Rfl:  .  HYDROcodone-acetaminophen (NORCO) 10-325 MG tablet, Take 1 tablet by mouth 3 (three) times daily as needed (for pain.). , Disp: , Rfl: 0 .  latanoprost (XALATAN) 0.005 % ophthalmic solution, Place 1 drop into both eyes at bedtime., Disp: , Rfl:  .  LORazepam (ATIVAN) 1 MG tablet, Take 1 mg by mouth at bedtime., Disp: , Rfl:  .  losartan (COZAAR) 25 MG tablet, TAKE 1 TABLET EVERY DAY, Disp: 30 tablet, Rfl: 6 .  Melatonin 1 MG TABS, Take 3 mg by mouth at bedtime., Disp: , Rfl:  .  warfarin (COUMADIN) 4 MG tablet, TAKE 1/2 TO 1 TABLET BY MOUTH DAILY AS DIRECTED BY COUMADIN CLINIC (Patient taking differently: TAKE 0.5 TABLET (2 MG) BY MOUTH EVERY EVENING, EXCEPT TAKE 1 TABLET (4 MG) BY MOUTH ON MONDAYS DAILY IN THE EVENING AS DIRECTED BY COUMADIN CLINIC), Disp: 30 tablet, Rfl: 2 .  Protein (PROCEL 100 PO), Take 1 scoop by mouth 2 (two) times daily., Disp: , Rfl:  Allergies  Allergen Reactions  . Lactose Intolerance (Gi) Other (See Comments)    Stomach pain  . Milk-Related Compounds Other (See Comments)    Upset stomach   . Sulfa Antibiotics Itching     Social History   Socioeconomic History  . Marital status: Single    Spouse name: Not on file  . Number of children:  Not on file  . Years of education: Not on file  . Highest education level: Not on file  Social Needs  . Financial resource strain: Not on file  . Food insecurity - worry: Not on file  . Food insecurity - inability: Not on file  . Transportation needs - medical: Not on file  . Transportation needs - non-medical: Not on file  Occupational History  . Not on file  Tobacco Use  . Smoking status: Former Smoker    Packs/day: 0.00    Years: 30.00    Pack years: 0.00    Types: Cigarettes    Last attempt to  quit: 04/21/1975    Years since quitting: 42.0  . Smokeless tobacco: Never Used  Substance and Sexual Activity  . Alcohol use: Yes    Comment: occasionally  . Drug use: No  . Sexual activity: Not Currently  Other Topics Concern  . Not on file  Social History Narrative   From SNF, getting PT and using cane.      Physical Exam      Future Appointments  Date Time Provider [place]  05/09/2017 10:00 AM Larey Dresser, MD [facility] None  05/10/2017  9:40 AM Troy Sine, MD [facility]  05/10/2017 10:00 AM [facility]  06/09/2017  1:30 PM Causey, Charlestine Massed, NP [facility] None   BP (!) 124/56   Pulse 62   Resp 16   Wt 159 lb (72.1 kg)   SpO2 98%   BMI 27.29 kg/m  Weight yesterday-159 Last visit weight-158   Pt states that she is not feeling well today. She feels her breathing is getting worse. She went to urologist last week and was diagnosed with UTI. Taking all her meds. No missed doses for this week. She is having pain to her rt flank area and her neck and shoulder. She states she has had the neck and shoulder pain for a long time and is taking pain medication for it. She is out of her lorazepam-she took some extra doses to help her sleep. She has started taking the melatonin also.  meds verified and pill box refilled.  She reports that she had a spell of lower left abd pain this morning with a small speck of bright red blood in her BM this morning. No pain currently. Advised her to keep an eye out and if it continues to happen then we will f/u for that.  She states that when she starts doing to much she gets sob quickly. I advised her to take it easy and allow her brother to assist more with things around the apt but she has a hard time letting others do for her-she was in the [employer] and she is independent so I try to be encouraging for her that its ok to let her brother help when he can.  Brother was here for a short time  when I arrived--he was concerned with her urologist as to why they dont check her urine for infections when they change her foley--I need to see if she would be eligible for home health to come out and do that for her if that is a possibility for her.   ACTION: Home visit completed  Marylouise Stacks, EMT-Paramedic 04/26/17

## 2017-04-26 NOTE — Telephone Encounter (Signed)
I spoke with [facility] Paramedic regarding patient's ongoing Foley catheter needs and history of frequent UTIs.  We both felt that patient could benefit from [facility] services for foley care and maintenance.  I called [facility] to find out if order can be placed for [facility] for ongoing assistance with Foley care.  The nurse at that clinic says that she will forward the request on to Dr. Tresa Moore.

## 2017-04-28 ENCOUNTER — Other Ambulatory Visit (HOSPITAL_COMMUNITY): Payer: Self-pay

## 2017-04-28 ENCOUNTER — Telehealth (HOSPITAL_COMMUNITY): Payer: Self-pay

## 2017-04-28 DIAGNOSIS — I5032 Chronic diastolic (congestive) heart failure: Secondary | ICD-10-CM

## 2017-04-28 NOTE — Telephone Encounter (Signed)
Pt aware and agreeable to plan. Orders placed. Will try to come tomorrow and sign paperwork for Opsumit.   Larey Dresser, MD  Shirley Muscat, RN; Harvie Junior, CMA        Please arrange for this patient to get V/Q scan to look for chronic PE and PFTs to look for intrinsic lung disease. Need to work on getting her Opsumit 10 mg daily.

## 2017-04-29 ENCOUNTER — Other Ambulatory Visit (HOSPITAL_COMMUNITY): Payer: Self-pay

## 2017-04-29 NOTE — Progress Notes (Signed)
Paramedicine Encounter    Patient ID: Natasha Fletcher, female    DOB: 1925-07-21, 84 y.o.   MRN: [id number]   Patient Care Team: Lorene Dy, MD as PCP - General (Internal Medicine) Troy Sine, MD as PCP - Cardiology (Cardiology) Constance Haw, MD as Consulting Physician (Cardiology)  Patient Active Problem List   Diagnosis Date Noted  . Atrial fibrillation ([place]) 03/22/2016  . Bradycardia 08/22/2015  . Junctional (nodal) bradycardia 08/22/2015  . Malnutrition of moderate degree 03/19/2015  . Benign essential HTN   . Bradycardia, drug induced 03/18/2015  . Hypokalemia 02/16/2015  . Thrombocytopenia ([place]) 02/16/2015  . Sepsis due to urinary tract infection ([place])   . Sepsis ([place]) 02/12/2015  . Urinary tract infectious disease   . Long-term (current) use of anticoagulants 07/12/2014  . Chronic anticoagulation 02/11/2014  . Hypertensive cardiovascular disease 01/24/2014  . Chronic diastolic CHF (congestive heart failure) ([place]) 01/24/2014  . Anemia 01/24/2014  . Breast cancer of upper-inner quadrant of right female breast ([place]) 01/24/2014  . Acute diastolic heart failure ([place]) 11/11/2013  . Atrial flutter ([place]) 11/11/2013  . PAF (paroxysmal atrial fibrillation) ([place]) 11/10/2013  . Slurred speech 11/10/2013  . Protein-calorie malnutrition, severe ([place]) 11/06/2013  . Coffee ground emesis 11/05/2013  . Dehydration 10/19/2013  . Urinary retention 10/18/2013  . GERD (gastroesophageal reflux disease) 03/20/2013  . S/P total knee arthroplasty   . UTI (urinary tract infection) 02/21/2013  . OA (osteoarthritis) of knee 02/19/2013  . Hypertension   . Thyroid disease   . Cancer of central portion of female breast ([place]) 04/14/2011    Current Outpatient Medications:  .  amiodarone (PACERONE) 200 MG tablet, Take 0.5 tablets (100 mg total) by mouth daily., Disp: 45 tablet, Rfl: 2 .  carvedilol (COREG) 12.5 MG tablet, Take 1.5 tablets (18.75 mg total) by mouth 2 (two) times  daily., Disp: 90 tablet, Rfl: 6 .  cephALEXin (KEFLEX) 500 MG capsule, Take 500 mg by mouth 2 (two) times daily., Disp: , Rfl:  .  furosemide (LASIX) 20 MG tablet, Take 40 mg by mouth 2 (two) times daily., Disp: , Rfl:  .  HYDROcodone-acetaminophen (NORCO) 10-325 MG tablet, Take 1 tablet by mouth 3 (three) times daily as needed (for pain.). , Disp: , Rfl: 0 .  latanoprost (XALATAN) 0.005 % ophthalmic solution, Place 1 drop into both eyes at bedtime., Disp: , Rfl:  .  LORazepam (ATIVAN) 1 MG tablet, Take 1 mg by mouth at bedtime., Disp: , Rfl:  .  losartan (COZAAR) 25 MG tablet, TAKE 1 TABLET EVERY DAY, Disp: 30 tablet, Rfl: 6 .  Melatonin 1 MG TABS, Take 3 mg by mouth at bedtime., Disp: , Rfl:  .  Protein (PROCEL 100 PO), Take 1 scoop by mouth 2 (two) times daily., Disp: , Rfl:  .  warfarin (COUMADIN) 4 MG tablet, TAKE 1/2 TO 1 TABLET BY MOUTH DAILY AS DIRECTED BY COUMADIN CLINIC (Patient taking differently: TAKE 0.5 TABLET (2 MG) BY MOUTH EVERY EVENING, EXCEPT TAKE 1 TABLET (4 MG) BY MOUTH ON MONDAYS DAILY IN THE EVENING AS DIRECTED BY COUMADIN CLINIC), Disp: 30 tablet, Rfl: 2 Allergies  Allergen Reactions  . Lactose Intolerance (Gi) Other (See Comments)    Stomach pain  . Milk-Related Compounds Other (See Comments)    Upset stomach   . Sulfa Antibiotics Itching      Social History   Socioeconomic History  . Marital status: Single    Spouse name: Not on file  . Number of  children: Not on file  . Years of education: Not on file  . Highest education level: Not on file  Social Needs  . Financial resource strain: Not on file  . Food insecurity - worry: Not on file  . Food insecurity - inability: Not on file  . Transportation needs - medical: Not on file  . Transportation needs - non-medical: Not on file  Occupational History  . Not on file  Tobacco Use  . Smoking status: Former Smoker    Packs/day: 0.00    Years: 30.00    Pack years: 0.00    Types: Cigarettes    Last attempt  to quit: 04/21/1975    Years since quitting: 42.0  . Smokeless tobacco: Never Used  Substance and Sexual Activity  . Alcohol use: Yes    Comment: occasionally  . Drug use: No  . Sexual activity: Not Currently  Other Topics Concern  . Not on file  Social History Narrative   From SNF, getting PT and using cane.      Physical Exam  Constitutional: She is oriented to person, place, and time.  Cardiovascular: Normal rate and regular rhythm.  Pulmonary/Chest: Effort normal and breath sounds normal. No respiratory distress. She has no wheezes. She has no rales.  Musculoskeletal: Normal range of motion. She exhibits no edema.  Neurological: She is alert and oriented to person, place, and time.  Skin: Skin is warm and dry.  Psychiatric: She has a normal mood and affect.        Future Appointments  Date Time Provider [place]  05/06/2017 11:00 AM [facility] None  05/06/2017 12:30 PM MC-R/F 1 MC[facility]DG [facility]  05/06/2017  1:00 PM MC-NM 3 MC-NM [facility]  05/09/2017 10:00 AM Larey Dresser, MD [facility] None  05/10/2017  9:40 AM Troy Sine, MD [facility]  05/10/2017 10:00 AM [facility]  06/09/2017  1:30 PM Causey, Charlestine Massed, NP [facility] None    BP 140/70 (BP Location: Right Arm, Patient Position: Sitting, Cuff Size: Normal)   Pulse 60   Resp 16   Wt 155 lb (70.3 kg)   SpO2 98%   BMI 26.61 kg/m   Weight yesterday- 159 lb Last visit weight- 159 lb ReDS Clip- 31%   Ms Hardeman was seen at home today at the request of Doroteo Bradford from the HF clinic. The patient stated she was feeling increasingly SOB with exertion and not sleeping well at night. Doroteo Bradford reported to me that she recently had a catheter change due to concerns over a UTI but no infection was reported. Upon speaking with Ms Kluesner, she advised that someone had called and told her she indeed had a UTI but she was not on any antibiotics. I looked at her pill bottles but not  antibiotic was seen. I contacted her urologist who advised that an Rx had been sent but [facility] when I contacted them. I called the urologist's office who resent the Rx and I went a picked it up. When I attempted to put the medication in her pillbox, I noted she already had this medication present. I contacted Joellen Jersey who advised that she has been on antibiotics for several days. I did not add any antibiotics to her pill box but I did place 6 mg of melatonin in her evening bins at her request. Joellen Jersey will follow up early next week.   Time spent with patient: 78 minutes  Jacquiline Doe, EMT  04/29/17  ACTION: Home visit completed Next visit planned for 1 week

## 2017-05-02 ENCOUNTER — Other Ambulatory Visit (HOSPITAL_COMMUNITY): Payer: Self-pay

## 2017-05-02 NOTE — Progress Notes (Signed)
Paramedicine Encounter    Patient ID: Natasha Fletcher, female    DOB: 06/11/1925, 84 y.o.   MRN: [id number]   Patient Care Team: Lorene Dy, MD as PCP - General (Internal Medicine) Troy Sine, MD as PCP - Cardiology (Cardiology) Constance Haw, MD as Consulting Physician (Cardiology)  Patient Active Problem List   Diagnosis Date Noted  . Atrial fibrillation ([place]) 03/22/2016  . Bradycardia 08/22/2015  . Junctional (nodal) bradycardia 08/22/2015  . Malnutrition of moderate degree 03/19/2015  . Benign essential HTN   . Bradycardia, drug induced 03/18/2015  . Hypokalemia 02/16/2015  . Thrombocytopenia ([place]) 02/16/2015  . Sepsis due to urinary tract infection ([place])   . Sepsis ([place]) 02/12/2015  . Urinary tract infectious disease   . Long-term (current) use of anticoagulants 07/12/2014  . Chronic anticoagulation 02/11/2014  . Hypertensive cardiovascular disease 01/24/2014  . Chronic diastolic CHF (congestive heart failure) ([place]) 01/24/2014  . Anemia 01/24/2014  . Breast cancer of upper-inner quadrant of right female breast ([place]) 01/24/2014  . Acute diastolic heart failure ([place]) 11/11/2013  . Atrial flutter ([place]) 11/11/2013  . PAF (paroxysmal atrial fibrillation) ([place]) 11/10/2013  . Slurred speech 11/10/2013  . Protein-calorie malnutrition, severe ([place]) 11/06/2013  . Coffee ground emesis 11/05/2013  . Dehydration 10/19/2013  . Urinary retention 10/18/2013  . GERD (gastroesophageal reflux disease) 03/20/2013  . S/P total knee arthroplasty   . UTI (urinary tract infection) 02/21/2013  . OA (osteoarthritis) of knee 02/19/2013  . Hypertension   . Thyroid disease   . Cancer of central portion of female breast ([place]) 04/14/2011    Current Outpatient Medications:  .  amiodarone (PACERONE) 200 MG tablet, Take 0.5 tablets (100 mg total) by mouth daily., Disp: 45 tablet, Rfl: 2 .  carvedilol (COREG) 12.5 MG tablet, Take 1.5 tablets (18.75 mg total) by mouth 2 (two) times  daily., Disp: 90 tablet, Rfl: 6 .  furosemide (LASIX) 20 MG tablet, Take 40 mg by mouth 2 (two) times daily., Disp: , Rfl:  .  HYDROcodone-acetaminophen (NORCO) 10-325 MG tablet, Take 1 tablet by mouth 3 (three) times daily as needed (for pain.). , Disp: , Rfl: 0 .  latanoprost (XALATAN) 0.005 % ophthalmic solution, Place 1 drop into both eyes at bedtime., Disp: , Rfl:  .  LORazepam (ATIVAN) 1 MG tablet, Take 1 mg by mouth at bedtime., Disp: , Rfl:  .  losartan (COZAAR) 25 MG tablet, TAKE 1 TABLET EVERY DAY, Disp: 30 tablet, Rfl: 6 .  Melatonin 1 MG TABS, Take 6 mg by mouth at bedtime. , Disp: , Rfl:  .  warfarin (COUMADIN) 4 MG tablet, TAKE 1/2 TO 1 TABLET BY MOUTH DAILY AS DIRECTED BY COUMADIN CLINIC (Patient taking differently: TAKE 0.5 TABLET (2 MG) BY MOUTH EVERY EVENING, EXCEPT TAKE 1 TABLET (4 MG) BY MOUTH ON MONDAYS DAILY IN THE EVENING AS DIRECTED BY COUMADIN CLINIC), Disp: 30 tablet, Rfl: 2 .  Protein (PROCEL 100 PO), Take 1 scoop by mouth 2 (two) times daily., Disp: , Rfl:  Allergies  Allergen Reactions  . Lactose Intolerance (Gi) Other (See Comments)    Stomach pain  . Milk-Related Compounds Other (See Comments)    Upset stomach   . Sulfa Antibiotics Itching     Social History   Socioeconomic History  . Marital status: Single    Spouse name: Not on file  . Number of children: Not on file  . Years of education: Not on file  . Highest education level: Not on file  Social Needs  . Financial resource strain: Not on file  . Food insecurity - worry: Not on file  . Food insecurity - inability: Not on file  . Transportation needs - medical: Not on file  . Transportation needs - non-medical: Not on file  Occupational History  . Not on file  Tobacco Use  . Smoking status: Former Smoker    Packs/day: 0.00    Years: 30.00    Pack years: 0.00    Types: Cigarettes    Last attempt to quit: 04/21/1975    Years since quitting: 42.0  . Smokeless tobacco: Never Used  Substance  and Sexual Activity  . Alcohol use: Yes    Comment: occasionally  . Drug use: No  . Sexual activity: Not Currently  Other Topics Concern  . Not on file  Social History Narrative   From SNF, getting PT and using cane.      Physical Exam  Constitutional: She is oriented to person, place, and time. She appears well-developed.  HENT:  Head: Normocephalic.  Cardiovascular: Normal rate.  Pulmonary/Chest: Effort normal.  Abdominal: Soft.  Neurological: She is oriented to person, place, and time.  Skin: Skin is warm and dry.  Psychiatric: She has a normal mood and affect.        Future Appointments  Date Time Provider [place]  05/06/2017 11:00 AM MC-RESPTX [facility] MC-RESPTX None  05/06/2017 12:30 PM MC-R/F 1 MC-DG [facility]  05/06/2017  1:00 PM MC-NM 3 MC-NM [facility]  05/09/2017 10:00 AM Larey Dresser, MD MC-HVSC None  05/10/2017  9:40 AM Troy Sine, MD [facility]  05/10/2017 10:00 AM [facility]  06/09/2017  1:30 PM Causey, Charlestine Massed, NP [facility] None   BP (!) 104/58   Pulse 60   Resp 15   Wt 159 lb (72.1 kg)   SpO2 98%   BMI 27.29 kg/m   Standing b/p-106/58 Weight yesterday-159 Last visit weight-159  Pt reports today she is feeling good, however this morning she states she felt sick after she took her meds. So it sounds like she isnt always taking her meds with food and she is also taking her pain pill prior to her other meds as well on empty stomach-so I advised her to take it with food. She states with that antibiotic cephalexin she got very bad diarrhea with it. She completed her round of it. She does have another bottle of it in her pill bag however there was confusion about whether or not she had taken it last week b/c the pill bottle had been thrown away already but it was in her pill box and zack had p/u the 2nd bottle but she is not taking it anymore.pt reports her breathing has improved but when she felt bad a few hrs ago  her sob returned.   She states that the melatonin is not working.  According to coumadin clinic she is to do 1/2 tab daily except 1 whole tab on mondays until her next check. meds verified and pill box refilled.  She reports that she usually goes to bed around 6pm and then doesn't sleep well at night and wakes up real early too so I asked her to see if she can stay awake until 8 to see if that will help her at all and Doroteo Bradford advised she can increase her melatonin dose to 6mg .  She states she has had some dizzy spells and her b/p as noted--she states that it seems its  when she is up walking around she becomes dizzy at times-no orthostatic changes noted-106/58 standing b/p. Advised pt to not over do it, take her time with things. Reviewed pts upcoming appointments with her.    ACTION: Home visit completed  Marylouise Stacks, EMT-Paramedic 05/02/17

## 2017-05-04 ENCOUNTER — Telehealth (HOSPITAL_COMMUNITY): Payer: Self-pay | Admitting: Pharmacist

## 2017-05-04 ENCOUNTER — Encounter: Payer: Medicare HMO | Admitting: *Deleted

## 2017-05-04 MED ORDER — MACITENTAN 10 MG PO TABS: 10.0000 mg | ORAL_TABLET | Freq: Every day | ORAL | 11 refills | Status: DC

## 2017-05-04 NOTE — Telephone Encounter (Signed)
Opsumit PA approved by [employer] Part D through 04/04/18.   Ruta Hinds. Velva Harman, PharmD, BCPS, CPP Clinical Pharmacist Phone: [phone] 05/04/2017 8:51 AM

## 2017-05-06 ENCOUNTER — Encounter (HOSPITAL_COMMUNITY)
Admission: RE | Admit: 2017-05-06 | Discharge: 2017-05-06 | Disposition: A | Discharge status: Home / Self Care | Payer: Medicare HMO | Source: Ambulatory Visit | Attending: Cardiology | Admitting: Cardiology

## 2017-05-06 ENCOUNTER — Ambulatory Visit (HOSPITAL_COMMUNITY)
Admission: RE | Admit: 2017-05-06 | Discharge: 2017-05-06 | Disposition: A | Discharge status: Home / Self Care | Payer: Medicare HMO | Source: Ambulatory Visit | Attending: Cardiology | Admitting: Cardiology

## 2017-05-06 ENCOUNTER — Encounter: Payer: Self-pay | Admitting: Cardiology

## 2017-05-06 DIAGNOSIS — R0602 Shortness of breath: Secondary | ICD-10-CM | POA: Diagnosis not present

## 2017-05-06 DIAGNOSIS — I5032 Chronic diastolic (congestive) heart failure: Secondary | ICD-10-CM

## 2017-05-06 DIAGNOSIS — J449 Chronic obstructive pulmonary disease, unspecified: Secondary | ICD-10-CM | POA: Insufficient documentation

## 2017-05-06 DIAGNOSIS — J984 Other disorders of lung: Secondary | ICD-10-CM | POA: Insufficient documentation

## 2017-05-06 LAB — PULMONARY FUNCTION TEST
DL/VA % PRED: 55 %
DL/VA: 2.66 ml/min/mmHg/L
DLCO COR: 7.01 ml/min/mmHg
DLCO cor % pred: 28 %
DLCO unc % pred: 25 %
DLCO unc: 6.15 ml/min/mmHg
FEF 25-75 PRE: 0.49 L/s
FEF 25-75 Post: 0.69 L/sec
FEF2575-%CHANGE-POST: 42 %
FEF2575-%PRED-PRE: 51 %
FEF2575-%Pred-Post: 72 %
FEV1-%CHANGE-POST: -6 %
FEV1-%PRED-PRE: 88 %
FEV1-%Pred-Post: 82 %
FEV1-POST: 1.05 L
FEV1-PRE: 1.13 L
FEV1FVC-%CHANGE-POST: -9 %
FEV1FVC-%Pred-Pre: 98 %
FEV6-%CHANGE-POST: 3 %
FEV6-%PRED-POST: 103 %
FEV6-%PRED-PRE: 100 %
FEV6-PRE: 1.57 L
FEV6-Post: 1.62 L
FEV6FVC-%PRED-PRE: 105 %
FEV6FVC-%Pred-Post: 105 %
FVC-%Change-Post: 3 %
FVC-%PRED-POST: 98 %
FVC-%Pred-Pre: 95 %
FVC-Post: 1.62 L
FVC-Pre: 1.57 L
POST FEV6/FVC RATIO: 100 %
PRE FEV6/FVC RATIO: 100 %
Post FEV1/FVC ratio: 65 %
Pre FEV1/FVC ratio: 72 %
RV % PRED: 70 %
RV: 1.81 L
TLC % PRED: 67 %
TLC: 3.42 L

## 2017-05-06 MED ORDER — ALBUTEROL SULFATE (2.5 MG/3ML) 0.083% IN NEBU
2.5000 mg | INHALATION_SOLUTION | Freq: Once | RESPIRATORY_TRACT | Status: AC
Start: 2017-05-06 — End: 2017-05-06
  Administered 2017-05-06: 2.5 mg via RESPIRATORY_TRACT

## 2017-05-06 MED ORDER — TECHNETIUM TO 99M ALBUMIN AGGREGATED
4.3200 | Freq: Once | INTRAVENOUS | Status: AC | PRN
  Administered 2017-05-06: 4.32 via INTRAVENOUS

## 2017-05-06 MED ORDER — TECHNETIUM TC 99M DIETHYLENETRIAME-PENTAACETIC ACID
32.4000 | Freq: Once | INTRAVENOUS | Status: AC | PRN
  Administered 2017-05-06: 32.4 via RESPIRATORY_TRACT

## 2017-05-09 ENCOUNTER — Encounter (HOSPITAL_COMMUNITY): Payer: Self-pay | Admitting: Cardiology

## 2017-05-09 ENCOUNTER — Other Ambulatory Visit (HOSPITAL_COMMUNITY): Payer: Self-pay

## 2017-05-09 ENCOUNTER — Ambulatory Visit (HOSPITAL_COMMUNITY)
Admission: RE | Admit: 2017-05-09 | Discharge: 2017-05-09 | Disposition: A | Discharge status: Home / Self Care | Payer: Medicare HMO | Source: Ambulatory Visit | Attending: Cardiology | Admitting: Cardiology

## 2017-05-09 VITALS — BP 107/49 | HR 61 | Wt 157.4 lb

## 2017-05-09 DIAGNOSIS — I480 Paroxysmal atrial fibrillation: Secondary | ICD-10-CM | POA: Diagnosis not present

## 2017-05-09 DIAGNOSIS — E1122 Type 2 diabetes mellitus with diabetic chronic kidney disease: Secondary | ICD-10-CM | POA: Diagnosis not present

## 2017-05-09 DIAGNOSIS — Z853 Personal history of malignant neoplasm of breast: Secondary | ICD-10-CM | POA: Diagnosis not present

## 2017-05-09 DIAGNOSIS — Z87891 Personal history of nicotine dependence: Secondary | ICD-10-CM | POA: Diagnosis not present

## 2017-05-09 DIAGNOSIS — I495 Sick sinus syndrome: Secondary | ICD-10-CM | POA: Insufficient documentation

## 2017-05-09 DIAGNOSIS — N183 Chronic kidney disease, stage 3 (moderate): Secondary | ICD-10-CM | POA: Diagnosis not present

## 2017-05-09 DIAGNOSIS — Z7901 Long term (current) use of anticoagulants: Secondary | ICD-10-CM | POA: Diagnosis not present

## 2017-05-09 DIAGNOSIS — I130 Hypertensive heart and chronic kidney disease with heart failure and stage 1 through stage 4 chronic kidney disease, or unspecified chronic kidney disease: Secondary | ICD-10-CM | POA: Insufficient documentation

## 2017-05-09 DIAGNOSIS — I071 Rheumatic tricuspid insufficiency: Secondary | ICD-10-CM | POA: Insufficient documentation

## 2017-05-09 DIAGNOSIS — Z79899 Other long term (current) drug therapy: Secondary | ICD-10-CM | POA: Diagnosis not present

## 2017-05-09 DIAGNOSIS — I272 Pulmonary hypertension, unspecified: Secondary | ICD-10-CM

## 2017-05-09 DIAGNOSIS — I5032 Chronic diastolic (congestive) heart failure: Secondary | ICD-10-CM | POA: Diagnosis not present

## 2017-05-09 DIAGNOSIS — E859 Amyloidosis, unspecified: Secondary | ICD-10-CM | POA: Diagnosis not present

## 2017-05-09 DIAGNOSIS — R0602 Shortness of breath: Secondary | ICD-10-CM

## 2017-05-09 DIAGNOSIS — I2721 Secondary pulmonary arterial hypertension: Secondary | ICD-10-CM | POA: Diagnosis not present

## 2017-05-09 LAB — BASIC METABOLIC PANEL
Anion gap: 12 (ref 5–15)
BUN: 47 mg/dL — AB (ref 6–20)
CO2: 24 mmol/L (ref 22–32)
Calcium: 9 mg/dL (ref 8.9–10.3)
Chloride: 101 mmol/L (ref 101–111)
Creatinine, Ser: 2.06 mg/dL — ABNORMAL HIGH (ref 0.44–1.00)
GFR calc Af Amer: 23 mL/min — ABNORMAL LOW (ref >60)
GFR, EST NON AFRICAN AMERICAN: 20 mL/min — AB (ref >60)
GLUCOSE: 96 mg/dL (ref 65–99)
Potassium: 4.6 mmol/L (ref 3.5–5.1)
Sodium: 137 mmol/L (ref 135–145)

## 2017-05-09 MED ORDER — TADALAFIL (PAH) 20 MG PO TABS: 20.0000 mg | ORAL_TABLET | Freq: Every day | ORAL | 3 refills | Status: DC

## 2017-05-09 MED ORDER — FUROSEMIDE 20 MG PO TABS: ORAL_TABLET | ORAL | 3 refills | Status: DC

## 2017-05-09 MED ORDER — CARVEDILOL 12.5 MG PO TABS: 12.5000 mg | ORAL_TABLET | Freq: Two times a day (BID) | ORAL | 3 refills | Status: DC

## 2017-05-09 NOTE — Patient Instructions (Addendum)
Stop Losartan  Decrease Carvedilol to 12.5 mg Twice daily   Increase Furosemide to 60 mg (3 tabs) in AM and 40 mg (2 tabs) in PM  We would like you to start Adcirca 20 mg  Daily, this will have to come from a specialty pharmacy, they will contact you before shipping the medication  Labs today  Labs in 10 days  PYP test  Your physician recommends that you schedule a follow-up appointment in: 1 month

## 2017-05-09 NOTE — Progress Notes (Signed)
Paramedicine Encounter   Patient ID: Natasha Fletcher , female,   DOB: 1925/12/16,84 y.o.,  MRN: [id number]   Met patient in clinic today with provider. Pt received her new medication opsumit and began taking it on Saturday. She reports it makes her feel very bad and she felt terrible this morning. She took her other meds along with it. She did not bring her meds with her today so I will have to go out later today and fill her pill box.  Decrease carvedilol to1 tab BID and will be stopping losartan.  Also adding adcirca. Increase lasix to 60 am and 40 pm.  Will be scanned for amyloidosis.  Blood work done today.   Weight at clinic-157 Weight at home-160 Time spent with patient 50mn  KMarylouise Stacks EMT-Paramedic 05/10/2017   ACTION: Home visit completed

## 2017-05-09 NOTE — Progress Notes (Signed)
PCP: Dr. Mancel Bale Cardiology: Dr. Claiborne Billings HF Cardiology: Dr. Aundra Dubin  84 yo with history of CKD stage 3, SSS with Medtronic PPM, paroxysmal atrial fibrillation, and chronic diastolic CHF was referred by Dr. Claiborne Billings for evaluation of CHF/pulmonary hypertension. She has had noticeable exertional dyspnea for over a year. It has worsened gradually. Currently, she is short of breath with most ADLs.  Most recent echo in 11/18 showed normal EF with severe LVH, restrictive diastolic function, moderately decreased RV systolic function, and severe pulmonary hypertension.  RHC in 1/19 showed severe PAH and mildly elevated PCWP. V/Q scan showed no chronic PEs and PFTs showed minimal obstructive/restriction.   She returns for followup of CHF and pulmonary hypertension. She continues to be very limited.  She is short of breath walking around her house.  No orthopnea/PND.  No chest pain.  She as been on Opsumit x 3 days.  She has occasional lightheaded symptoms (orthostasis).   Labs (9/18): K 4.3, creatinine 1.68 Labs (11/18): TSH normal, LFTs normal, NT-proBNP 4311, hgb 9.7 Labs (1/19): K 4.4, creatinine 1.58, hgb 10  PMH: 1. CKD stage 3 2. Right breast cancer.  3. Atrial fibrillation: Paroxysmal.  H/o DCCV.  On amiodarone to maintain NSR.   4. Right TKR 2015 5. Sick sinus syndrome: Medtronic PPM 5/17.  6. Type II diabetes 7. HTN 8. Chronic diastolic CHF: Echo (01/60) with EF 65-70%, severe LVH, restrictive diastolic function, mild MR, mild AI, moderately dilated RV with moderately decreased systolic function, PASP 79 mmHg, severe TR.  9. Pulmonary hypertension:  - RHC (1/19): mean RA 8, PA 72/22 mean 41, mean PCWP 20, CI 2.22, PVR 5.4 WU. - PFTs (2/19): minimal obstruction, mild restriction, severely decreased DLCO.  - V/Q scan (2/19): No chronic or acute PE. 10. Severe TR  Social History   Socioeconomic History  . Marital status: Single    Spouse name: Not on file  . Number of children: Not on file  .  Years of education: Not on file  . Highest education level: Not on file  Social Needs  . Financial resource strain: Not on file  . Food insecurity - worry: Not on file  . Food insecurity - inability: Not on file  . Transportation needs - medical: Not on file  . Transportation needs - non-medical: Not on file  Occupational History  . Not on file  Tobacco Use  . Smoking status: Former Smoker    Packs/day: 0.00    Years: 30.00    Pack years: 0.00    Types: Cigarettes    Last attempt to quit: 04/21/1975    Years since quitting: 42.0  . Smokeless tobacco: Never Used  Substance and Sexual Activity  . Alcohol use: Yes    Comment: occasionally  . Drug use: No  . Sexual activity: Not Currently  Other Topics Concern  . Not on file  Social History Narrative   From SNF, getting PT and using cane.     Family History  Problem Relation Age of Onset  . Heart disease Mother   . Stroke Mother   . Hypertension Mother   . Stroke Father   . Hypertension Father   . Stroke Brother   . Heart attack Neg Hx    ROS: All systems reviewed and negative except as per HPI.   Current Outpatient Medications  Medication Sig Dispense Refill  . amiodarone (PACERONE) 200 MG tablet Take 0.5 tablets (100 mg total) by mouth daily. 45 tablet 2  . carvedilol (COREG)  12.5 MG tablet Take 1 tablet (12.5 mg total) by mouth 2 (two) times daily. 60 tablet 3  . furosemide (LASIX) 20 MG tablet Take 60mg  (3 Tablets) in the AM and 40 mg (2 Tablets) in the PM. 150 tablet 3  . HYDROcodone-acetaminophen (NORCO) 10-325 MG tablet Take 1 tablet by mouth 3 (three) times daily as needed (for pain.).   0  . latanoprost (XALATAN) 0.005 % ophthalmic solution Place 1 drop into both eyes at bedtime.    Marland Kitchen LORazepam (ATIVAN) 1 MG tablet Take 1 mg by mouth at bedtime.    . Macitentan (OPSUMIT) 10 MG TABS Take 1 tablet (10 mg total) by mouth daily. 30 tablet 11  . Melatonin 1 MG TABS Take 6 mg by mouth at bedtime.     . Protein (PROCEL  100 PO) Take 1 scoop by mouth 2 (two) times daily.    Marland Kitchen warfarin (COUMADIN) 4 MG tablet TAKE 1/2 TO 1 TABLET BY MOUTH DAILY AS DIRECTED BY COUMADIN CLINIC (Patient taking differently: TAKE 0.5 TABLET (2 MG) BY MOUTH EVERY EVENING, EXCEPT TAKE 1 TABLET (4 MG) BY MOUTH ON MONDAYS DAILY IN THE EVENING AS DIRECTED BY COUMADIN CLINIC) 30 tablet 2  . tadalafil, PAH, (ADCIRCA) 20 MG tablet Take 1 tablet (20 mg total) by mouth daily. (Patient not taking: Reported on 05/09/2017) 30 tablet 3   No current facility-administered medications for this encounter.    BP (!) 107/49   Pulse 61   Wt 157 lb 6.4 oz (71.4 kg)   SpO2 97%   BMI 27.02 kg/m  General: NAD Neck: JVP 8-9 cm, no thyromegaly or thyroid nodule.  Lungs: Crackles at bases bilaterally. CV: Nondisplaced PMI.  Heart regular S1/S2, no S3/S4, 3/6 HSM LLSB.  No peripheral edema.  No carotid bruit.  Normal pedal pulses.  Abdomen: Soft, nontender, no hepatosplenomegaly, no distention.  Skin: Intact without lesions or rashes.  Neurologic: Alert and oriented x 3.  Psych: Normal affect. Extremities: No clubbing or cyanosis.  HEENT: Normal.   Assessment/Plan: 1. Atrial fibrillation: Paroxysmal.  She is in NSR today on amiodarone, denies palpitations.  - Continue warfarin.  - She will continue amiodarone, recent LFTs/TSH normal.  She will need regular eye exam.  2. Chronic diastolic CHF: Echo 27/25 with normal EF, severe LVH, moderate RV systolic function, severe pulmonary hypertension.  Severe LVH in this patient raises concern for either long-standing poorly controlled HTN or cardiac amyloidosis.  I am especially concerned for cardiac amyloidosis, TTR versus AL.  She appeared volume overloaded today on exam, NYHA class IIIb symptoms (stable). - I initially had her increase Lasix to 60 qam/40 qpm, but with rise in creatinine on today's labs, I will keep Lasix at 40 mg bid.  Repeat BMET next week.  - Cannot do cardiac MRI (to assess for amyloidosis) due  to PPM.  I will send SPEP and urine immunofixation.  I am also ordering a PYP scan to assess for TTR amyloidosis.  3. HTN: BP is on the lower side today and she has had orthostatic symptoms.  - Stop losartan (creatinine also up).  - Decrease Coreg to 12.5 mg bid.  4. Pulmonary hypertension: Severe pulmonary hypertension on echo.  RHC showed pulmonary arterial hypertension.  I am concerned for group 1 PH.  V/Q scan with no chronic PE, PFTs not markedly abnormal.  - Send serological studies: anti-scl-70, ANA, RF, anti-Jo-1.  - Continue Opsumit, add Adcirca 20 mg daily and titrate up.  - Bring walker  to next appointment for 6 minute walk.  5. CKD: Stage 3.  Creatinine higher on labs done today.  As above, stopping losartan and will not increase Lasix.  6. Tricuspid regurgitation: Severe on last echo.    Followup 1 month.  Loralie Champagne 05/09/2017

## 2017-05-09 NOTE — Progress Notes (Signed)
Came by for a med rec--met pt in clinic this morning but she did not bring her pill box with her so I had to come out to apt to fill it for her.  She is taking the 71m of melatonin at night and it seems to be working better for her.  Not gotten the adcirca yet.  meds verified and pill box.  Will need to follow up on the home health referral.   KMarylouise Stacks EMT-Paramedic 05/09/17

## 2017-05-10 ENCOUNTER — Ambulatory Visit (INDEPENDENT_AMBULATORY_CARE_PROVIDER_SITE_OTHER): Payer: Medicare HMO | Admitting: Pharmacist

## 2017-05-10 ENCOUNTER — Ambulatory Visit: Payer: Medicare HMO | Admitting: Cardiovascular Disease

## 2017-05-10 ENCOUNTER — Other Ambulatory Visit (HOSPITAL_COMMUNITY): Payer: Self-pay

## 2017-05-10 ENCOUNTER — Telehealth (HOSPITAL_COMMUNITY): Payer: Self-pay | Admitting: *Deleted

## 2017-05-10 ENCOUNTER — Encounter: Payer: Self-pay | Admitting: Cardiovascular Disease

## 2017-05-10 DIAGNOSIS — I272 Pulmonary hypertension, unspecified: Secondary | ICD-10-CM | POA: Diagnosis not present

## 2017-05-10 DIAGNOSIS — Z7901 Long term (current) use of anticoagulants: Secondary | ICD-10-CM | POA: Diagnosis not present

## 2017-05-10 DIAGNOSIS — Z950 Presence of cardiac pacemaker: Secondary | ICD-10-CM

## 2017-05-10 DIAGNOSIS — I480 Paroxysmal atrial fibrillation: Secondary | ICD-10-CM

## 2017-05-10 DIAGNOSIS — Z79899 Other long term (current) drug therapy: Secondary | ICD-10-CM

## 2017-05-10 DIAGNOSIS — I5032 Chronic diastolic (congestive) heart failure: Secondary | ICD-10-CM | POA: Diagnosis not present

## 2017-05-10 DIAGNOSIS — R0602 Shortness of breath: Secondary | ICD-10-CM | POA: Diagnosis not present

## 2017-05-10 DIAGNOSIS — I517 Cardiomegaly: Secondary | ICD-10-CM

## 2017-05-10 LAB — PROTEIN ELECTRO, RANDOM URINE
Albumin ELP, Urine: 42.8 %
Alpha-1-Globulin, U: 2.5 %
Alpha-2-Globulin, U: 10.6 %
BETA GLOBULIN, U: 26.7 %
Gamma Globulin, U: 17.4 %
TOTAL PROTEIN, URINE-UPE24: 25.3 mg/dL

## 2017-05-10 LAB — PROTEIN ELECTROPHORESIS, SERUM
A/G Ratio: 1.1 (ref 0.7–1.7)
ALBUMIN ELP: 3.6 g/dL (ref 2.9–4.4)
ALPHA-1-GLOBULIN: 0.2 g/dL (ref 0.0–0.4)
ALPHA-2-GLOBULIN: 0.7 g/dL (ref 0.4–1.0)
Beta Globulin: 1 g/dL (ref 0.7–1.3)
GAMMA GLOBULIN: 1.3 g/dL (ref 0.4–1.8)
Globulin, Total: 3.3 g/dL (ref 2.2–3.9)
M-SPIKE, %: 0.3 g/dL — AB
Total Protein ELP: 6.9 g/dL (ref 6.0–8.5)

## 2017-05-10 LAB — POCT INR: INR: 2.5

## 2017-05-10 MED ORDER — FUROSEMIDE 20 MG PO TABS: 40.0000 mg | ORAL_TABLET | Freq: Two times a day (BID) | ORAL | 3 refills | Status: DC

## 2017-05-10 NOTE — Telephone Encounter (Signed)
-----   Message from Larey Dresser, MD sent at 05/09/2017 11:28 PM EST ----- Keep Lasix dose at 40 mg bid (rather than increasing it) and make sure that she stopped losartan.  Repeat BMET 1 week.

## 2017-05-10 NOTE — Progress Notes (Signed)
Patient ID: Natasha Fletcher, female   DOB: 01-10-1926, 84 y.o.   MRN: [id number]   HPI: Natasha Fletcher is a 84 y.o. female who presents to the office today for a 3 month follow-up cardiology evaluation.    Natasha Fletcher is status post right total knee replacement in July 2015 and developed postoperative anemia, urinary retention and dehydration.  She underwent red cell transfusion.  In August 2015, she developed acute renal failure was omitted to the emergency room with a creatinine of 7.2, potassium 6.2.  She was hydrated, her ACE inhibitor inhibitor was discontinued and a Foley was placed.  She subsequently developed an episode of presyncope and was found to be in atrial fibrillation with RVR leading to her readmission.  Several days later.  Cardiology consultation was obtained in the hospital on 11/11/2013.  An echo revealed ejection fraction of 65-70% with grade 2 diastolic dysfunction and there was evidence for an intracavity gradient of 16 mm.  She was on anticoagulation and ultimately underwent cardioversion in October 2015.  She was in normal sinus rhythm.  On subsequent follow-up in November 2015.  She saw Natasha Fletcher in March 2016.  When I saw  her in October 2016 her ECG showed atrial flutter with 4:1 conduction.  I recommended an echo Doppler study which showed an EF of 55-60% and evidence for severe LVH.  There was moderate left atrial and mild biatrial dilatation.  I was to see her back in follow-up but I have not seen her since. Since I saw her in December 2016 she was admitted to [facility] hospital with a UTI and was in atrial fibrillation. Cardizem and metoprolol were increased, although she developed significant bradycardia and hypotension leading to subsequent dose reduction. In early February.  She was found to be in atrial fibrillation with rapid ventricular response.  Since her hospitalization, she has been seen by several APPs. She also really was referred for a cardioversion for atrial  fibrillation which was done on 06/04/2015 by Dr. Meda Coffee. She presents now for follow-up evaluation.  She underwent an Medtronic permanent pacemaker implantation for sick sinus syndrome in May 2017.  She has been on chronic anticoagulation with warfarin.  She has had issues with shortness of breath and lower extremity edema necessitating increasing diuretic therapy.   An echo Doppler study on 01/19/2018showed severe LVH with normal systolic function with an EF of 60-65%.  There was grade 2 diastolic dysfunction.  Was moderate tricuspid regurgitation.  She had severe PA pressure increased at 69 mmHg.    She has seen Natasha Fletcher in April 2018.  She was having shortness of breath but was not felt to have evidence for heart failure on exam.  Device interrogation showed flat histograms without much are rate variability.  Her blood pressure was stable.  She was seen by Natasha Fletcher 01/17/2017 and again had complaints of shortness of breath and fatigue.  She did not appear to be volume overload by physical exam.  Her echo in January 2018 showed an EF of 60-65% with grade 2 diastolic dysfunction, moderate TR, and elevated pulmonary pressures.  She was not having any palpitations.  Her blood pressure was controlled.    When I last saw her in November 2018, I recommended a follow-up echo Doppler study to reassess her pulmonary hypertension.  This showed hyperdynamic LV function with severe LVH and grade 3 diastolic dysfunction.  There was mild aortic insufficiency, moderate RV dilation with reduced RV function, severe TR, and severe  pulmonary hypertension with PA systolic pressure at 79 mm.  As result, I referred her to Natasha Fletcher for further evaluation.  Subsequently, she has undergone a right left heart cardiac catheterization, which confirms severe pulmonary arterial hypertension with a PA at 72/22 and a mean of 41.  PVR was 5.4 WU.Marland Kitchen  She socially underwent PFTs as well as a VQ scan.  VQ scan did not reveal  any chronic PE.  PFTs were not markedly abnormal.  With her severe LVH.  There was some concern for cardiac amyloidosis, T TR versus AL and because of her pacemaker.  She's not a candidate for cardiac MRI.  She is undergoing serologic studies with SPEP in your Pap and also to undergo a pyrophosphate scan to assess for T TR amyloidosis.  She's been treated with diuretic therapy.  Serologic studies were also sent, including anti-SCI 70, ANA,  RF, and anti-Jo 1. ,  She developed worsening renal function with a creatinine increasing to 2.06, and losartan was discontinued.  She was just given a prescription for Opsumit and she may require addition of Adcirca.  She presents for reevaluation.  Past Medical History:  Diagnosis Date  . Arthritis    "all over"  . Asthma   . Atrial fibrillation ([place])    a. s/p DCCV in 01/2014 b. repeat DCCV in 06/2015, started on Amiodarone at that time. On Coumadin for anticoagulation.  . Cancer ([place])    rt breast  . Chronic diastolic CHF (congestive heart failure) ([place])    a. 08/2015: Echo with EF of 60-65%, no WMA, LA mod dilated, mild TR.  . Diabetes mellitus without complication ([place])   . Edema leg   . Fast breathing    "because of pill"  . GERD (gastroesophageal reflux disease)   . Glaucoma   . Glaucoma   . Hypertension   . Incontinence   . Pneumonia    h/o younger  . S/P total knee arthroplasty   . SSS (sick sinus syndrome) ([place])    a. s/p Medtronic Advisa DR MRI [id number] (serial number [id number]) PPM placement in 08/2015  . Swelling    bilateral feet/ legs, more left.  . Thyroid disease    "something years ago"  . UTI (urinary tract infection) 02/12/2015  . Wears dentures   . Wears glasses     Past Surgical History:  Procedure Laterality Date  . ABDOMINAL HYSTERECTOMY    . APPENDECTOMY    . BREAST BIOPSY  04/27/2011   Procedure: BREAST BIOPSY WITH NEEDLE LOCALIZATION;  Surgeon: Edward Jolly, MD;  Location: [facility];   Service: General;  Laterality: Right;  right needle localized breast lumpectomy   . BREAST LUMPECTOMY  04/27/11   right breast by [place]  . CARDIOVERSION N/A 02/01/2014   Procedure: CARDIOVERSION;  Surgeon: Candee Furbish, MD;  Location: [facility] ENDOSCOPY;  Service: Cardiovascular;  Laterality: N/A;  . CARDIOVERSION N/A 06/04/2015   Procedure: CARDIOVERSION;  Surgeon: Dorothy Spark, MD;  Location: [facility];  Service: Cardiovascular;  Laterality: N/A;  . CARDIOVERSION N/A 07/14/2015   Procedure: CARDIOVERSION;  Surgeon: Thayer Headings, MD;  Location: [facility] ENDOSCOPY;  Service: Cardiovascular;  Laterality: N/A;  . CARPAL TUNNEL RELEASE Bilateral   . COLONOSCOPY    . EP IMPLANTABLE DEVICE N/A 08/22/2015   Procedure: Pacemaker Implant;  Surgeon: Will Meredith Leeds, MD;  Medtronic Advisa DR MRI [phone] [id number] (serial number [id number]);  Laterality: N/A;  . EYE SURGERY  both cataracts  . RIGHT HEART CATH N/A 04/21/2017   Procedure: RIGHT HEART CATH;  Surgeon: Larey Dresser, MD;  Location: [facility] CV LAB;  Service: Cardiovascular;  Laterality: N/A;  . SHOULDER SURGERY Left   . TEE WITHOUT CARDIOVERSION N/A 02/01/2014   Procedure: TRANSESOPHAGEAL ECHOCARDIOGRAM (TEE);  Surgeon: Candee Furbish, MD;  Location: [facility] ENDOSCOPY;  Service: Cardiovascular;  Laterality: N/A;  . TONSILLECTOMY    . TOTAL HIP ARTHROPLASTY Bilateral   . TOTAL KNEE ARTHROPLASTY Right 02/19/2013   Procedure: RIGHT TOTAL KNEE ARTHROPLASTY;  Surgeon: Gearlean Alf, MD;  Location: [facility] ORS;  Service: Orthopedics;  Laterality: Right;  . TOTAL KNEE ARTHROPLASTY Left 10/15/2013   Procedure: LEFT TOTAL KNEE ARTHROPLASTY;  Surgeon: Gearlean Alf, MD;  Location: [facility] ORS;  Service: Orthopedics;  Laterality: Left;    Allergies  Allergen Reactions  . Lactose Intolerance (Gi) Other (See Comments)    Stomach pain  . Milk-Related Compounds Other (See Comments)    Upset stomach   . Sulfa Antibiotics Itching    Current Outpatient  Medications  Medication Sig Dispense Refill  . amiodarone (PACERONE) 200 MG tablet Take 0.5 tablets (100 mg total) by mouth daily. 45 tablet 2  . carvedilol (COREG) 12.5 MG tablet Take 1 tablet (12.5 mg total) by mouth 2 (two) times daily. 60 tablet 3  . HYDROcodone-acetaminophen (NORCO) 10-325 MG tablet Take 1 tablet by mouth 3 (three) times daily as needed (for pain.).   0  . latanoprost (XALATAN) 0.005 % ophthalmic solution Place 1 drop into both eyes at bedtime.    Marland Kitchen LORazepam (ATIVAN) 1 MG tablet Take 1 mg by mouth at bedtime.    . Macitentan (OPSUMIT) 10 MG TABS Take 1 tablet (10 mg total) by mouth daily. 30 tablet 11  . Melatonin 1 MG TABS Take 6 mg by mouth at bedtime.     . Protein (PROCEL 100 PO) Take 1 scoop by mouth 2 (two) times daily.    . tadalafil, PAH, (ADCIRCA) 20 MG tablet Take 1 tablet (20 mg total) by mouth daily. 30 tablet 3  . warfarin (COUMADIN) 4 MG tablet TAKE 1/2 TO 1 TABLET BY MOUTH DAILY AS DIRECTED BY COUMADIN CLINIC (Patient taking differently: TAKE 0.5 TABLET (2 MG) BY MOUTH EVERY EVENING, EXCEPT TAKE 1 TABLET (4 MG) BY MOUTH ON MONDAYS DAILY IN THE EVENING AS DIRECTED BY COUMADIN CLINIC) 30 tablet 2  . furosemide (LASIX) 20 MG tablet Take 2 tablets (40 mg total) by mouth 2 (two) times daily. 150 tablet 3   No current facility-administered medications for this visit.     Social History   Socioeconomic History  . Marital status: Single    Spouse name: Not on file  . Number of children: Not on file  . Years of education: Not on file  . Highest education level: Not on file  Social Needs  . Financial resource strain: Not on file  . Food insecurity - worry: Not on file  . Food insecurity - inability: Not on file  . Transportation needs - medical: Not on file  . Transportation needs - non-medical: Not on file  Occupational History  . Not on file  Tobacco Use  . Smoking status: Former Smoker    Packs/day: 0.00    Years: 30.00    Pack years: 0.00     Types: Cigarettes    Last attempt to quit: 04/21/1975    Years since quitting: 42.0  . Smokeless tobacco: Never Used  Substance  and Sexual Activity  . Alcohol use: Yes    Comment: occasionally  . Drug use: No  . Sexual activity: Not Currently  Other Topics Concern  . Not on file  Social History Narrative   From SNF, getting PT and using cane.      Family History  Problem Relation Age of Onset  . Heart disease Mother   . Stroke Mother   . Hypertension Mother   . Stroke Father   . Hypertension Father   . Stroke Brother   . Heart attack Neg Hx     ROS General: Negative; No fevers, chills, or night sweats HEENT: Negative; No changes in vision or hearing, sinus congestion, difficulty swallowing Pulmonary: Negative; No cough, wheezing, shortness of breath, hemoptysis Cardiovascular: See HPI:  GI: Negative; No nausea, vomiting, diarrhea, or abdominal pain GU: Negative; No dysuria, hematuria, or difficulty voiding Musculoskeletal: Negative; no myalgias, joint pain, or weakness Hematologic: Negative; no easy bruising, bleeding Endocrine: Negative; no heat/cold intolerance; no diabetes, Neuro: Negative; no changes in balance, headaches Skin: Negative; No rashes or skin lesions Psychiatric: Negative; No behavioral problems, depression Sleep: Negative; No snoring,  daytime sleepiness, hypersomnolence, bruxism, restless legs, hypnogognic hallucinations. Other comprehensive 14 point system review is negative   Physical Exam BP (!) 90/47   Pulse 60   Resp 16   Ht 5' 4"  (1.626 m)   Wt 157 lb (71.2 kg)   SpO2 98%   BMI 26.95 kg/m    Repeat blood pressure by me was 110/58  Wt Readings from Last 3 Encounters:  05/16/17 155 lb (70.3 kg)  05/10/17 158 lb (71.7 kg)  05/10/17 157 lb (71.2 kg)   General: Alert, oriented, no distress.  Skin: normal turgor, no rashes, warm and dry HEENT: Normocephalic, atraumatic. Pupils equal round and reactive to light; sclera anicteric;  extraocular muscles intact;  Nose without nasal septal hypertrophy Mouth/Parynx benign; Mallinpatti scale 3 Neck: No JVD, no carotid bruits; normal carotid upstroke Lungs: clear to ausculatation and percussion; no wheezing or rales Chest wall: without tenderness to palpitation Heart: PMI not displaced, RRR, s1 s2 normal, 0-6/3 systolic murmurLSB, no diastolic murmur, increased P2;  no rubs, gallops, thrills, or heaves Abdomen: soft, nontender; no hepatosplenomehaly, BS+; abdominal aorta nontender and not dilated by palpation. Back: no CVA tenderness Pulses 2+ Musculoskeletal: full range of motion, normal strength, no joint deformities Extremities: no clubbing cyanosis or edema, Homan's sign negative  Neurologic: grossly nonfocal; Cranial nerves grossly wnl Psychologic: Normal mood and affect   ECG (independently read by me): Atrially paced rhythm with a PR prolongation at 288 ms.  QTc interval 498 ms.  November 2018 ECG (independently read by me): Atrially paced rhythm at 60 bpm.  Prolonged AV conduction with PR interval 266 ms.  QTc interval increased at 508 ms.  March 2018 ECG (independently read by me): Atrially paced rhythm at 60 bpm.  Prolonged AV conduction with PR interval 286 ms.  Nonspecific ST-T changes.  QTc interval increased at 522 ms.  January 2018 ECG (independently read by me): Atrially paced rhythm at 60 bpm.  Prolonged AV conduction with a PR interval at 270 ms.  Nonspecific ST-T changes.  March 2017 ECG (independently read by me): Atrial flutter with a ventricular rate at 87 bpm with variable block.  Nonspecific ST-T changes.  My personal review of her March 2016 ECG shows sinus bradycardia at 58  LABS:  BMP Latest Ref Rng & Units 05/09/2017 04/15/2017 07/20/2016  Glucose 65 - 99 mg/dL  96 105(H) 115(H)  BUN 6 - 20 mg/dL 47(H) 27(H) 27(H)  Creatinine 0.44 - 1.00 mg/dL 2.06(H) 1.58(H) 1.68(H)  Sodium 135 - 145 mmol/L 137 138 138  Potassium 3.5 - 5.1 mmol/L 4.6 4.4 4.3    Chloride 101 - 111 mmol/L 101 105 104  CO2 22 - 32 mmol/L 24 24 25   Calcium 8.9 - 10.3 mg/dL 9.0 9.1 9.0    Hepatic Function Latest Ref Rng & Units 03/01/2017 10/05/2016 03/22/2016  Total Protein 6.0 - 8.5 g/dL 6.7 6.6 6.9  Albumin 3.5 - 4.7 g/dL 4.0 3.6 3.9  AST 0 - 40 IU/L 22 23 24   ALT 0 - 32 IU/L 16 13 13   Alk Phosphatase 39 - 117 IU/L 106 86 87  Total Bilirubin 0.0 - 1.2 mg/dL 0.7 0.6 0.6  Bilirubin, Direct 0.00 - 0.40 mg/dL 0.19 0.15 -    CBC Latest Ref Rng & Units 04/15/2017 01/17/2017 07/20/2016  WBC 4.0 - 10.5 K/uL 5.5 5.2 7.0  Hemoglobin 12.0 - 15.0 g/dL 10.0(L) 9.7(L) 9.5(L)  Hematocrit 36.0 - 46.0 % 33.9(L) 32.3(L) 31.0(L)  Platelets 150 - 400 K/uL 197 234 221   Lab Results  Component Value Date   MCV 78.8 04/15/2017   MCV 80 01/17/2017   MCV 84.2 07/20/2016    Lab Results  Component Value Date   TSH 1.420 03/01/2017    BNP    Component Value Date/Time   BNP 386.4 (H) 03/08/2016 1024    ProBNP    Component Value Date/Time   PROBNP 4,311 (H) 01/17/2017 1438   PROBNP 3,143.0 (H) 11/17/2013 0442    Lipid Panel  No results found for: CHOL, TRIG, HDL, CHOLHDL, VLDL, LDLCALC, LDLDIRECT  INR today 4.7   RADIOLOGY: Dg Chest 2 View  Result Date: 05/06/2017 CLINICAL DATA:  Chronic diastolic heart failure EXAM: CHEST  2 VIEW COMPARISON:  01/20/2017 chest radiograph. FINDINGS: Stable configuration of 2 lead left subclavian pacemaker. Stable cardiomediastinal silhouette with mild cardiomegaly. No pneumothorax. No pleural effusion. Mild pulmonary edema. No acute consolidative airspace disease. IMPRESSION: Mild congestive heart failure. Electronically Signed   By: Ilona Sorrel M.D.   On: 05/06/2017 14:49   Nm Pulmonary Perf And Vent  Result Date: 05/06/2017 CLINICAL DATA:  Chronic diastolic heart failure question chronic pulmonary emboli, tired, shortness of breath for 1 year, history hypertension, asthma, former smoker EXAM: NUCLEAR MEDICINE VENTILATION - PERFUSION  LUNG SCAN TECHNIQUE: Ventilation images were obtained in multiple projections using inhaled aerosol Tc-53mDTPA. Perfusion images were obtained in multiple projections after intravenous injection of Tc-975mAA. RADIOPHARMACEUTICALS:  32.4 mCi of Tc-9951mPA aerosol inhalation and 4.32 mCi Tc99m56m-IV COMPARISON:  None Radiographic correlation: Chest radiograph 05/06/2017 FINDINGS: Ventilation: Normal Perfusion: Normal IMPRESSION: Normal ventilation and perfusion lung scan. Electronically Signed   By: MarkLavonia Dana.   On: 05/06/2017 15:33    IMPRESSION:  1. PAF (paroxysmal atrial fibrillation) (HCC)[place]2. SOB (shortness of breath)   3. Severe Pulmonary hypertension, unspecified (HCC)[place]4. Chronic diastolic CHF (congestive heart failure) (HCC)[place]5. Severe left ventricular hypertrophy   6. Medication management   7. Pacemaker     ASSESSMENT AND PLAN: Natasha Fletcher 89 y18r-old African-American female who has a history of atrial flutter and PAF and underwent insertion of a permanent pacemaker for sick sinus syndrome in May 2017.  She has a history of hypertension, chronic diastolic heart failure and was found to develop progressive pulmonary hypertension alternately leading to referral  to Dr. Aundra Dubin.  Severe bony hypertension was confirmed on right heart cath with a PVR of 5.4.  She is felt most likely to have Group 1 pulmonary arterial hypertension.  She does not appear to have  significant parenchymal lung disease.  She is undergoing evaluation for possible cardiac amyloidosis and was recently started on Opsumit with plans to potentially add Adcirca.  ,  She developed worsening renal insufficiency with increased diuresis with a creatinine increasing to 2.06, and losartan was just discontinued at her last office visit with Dr. Aundra Dubin.  She is scheduled for repeat blood work next week.  She is maintaining normal sinus rhythm on amiodarone and is atrially paced on her ECG today.  She will be seeing Dr.  Aundra Dubin at follow-up of her primary hypertension and further evaluation.  She has been scheduled for a pyrophosphate scan to assess for TTR  amyloidosis.  I will see her in 3 months for reevaluation.    Time spent: 25 minutes Troy Sine, MD, [facility]  05/16/2017 6:51 PM

## 2017-05-10 NOTE — Telephone Encounter (Signed)
Notes recorded by Scarlette Calico, RN on 05/10/2017 at 10:23 AM EST Pt is in the paramedicine program, spoke w/Katie who is the paramedic that sees pt and fills her pill box for her, Joellen Jersey is aware of med changes and will go by pt's home later today to adjust pill box accordingly, pt is already scheduled for repeat labs on 2/14.

## 2017-05-10 NOTE — Progress Notes (Signed)
Paramedicine Encounter    Patient ID: Natasha Fletcher, female    DOB: July 12, 1925, 84 y.o.   MRN: [id number]   Patient Care Team: Lorene Dy, MD as PCP - General (Internal Medicine) Troy Sine, MD as PCP - Cardiology (Cardiology) Constance Haw, MD as Consulting Physician (Cardiology)  Patient Active Problem List   Diagnosis Date Noted  . Atrial fibrillation ([place]) 03/22/2016  . Bradycardia 08/22/2015  . Junctional (nodal) bradycardia 08/22/2015  . Malnutrition of moderate degree 03/19/2015  . Benign essential HTN   . Bradycardia, drug induced 03/18/2015  . Hypokalemia 02/16/2015  . Thrombocytopenia ([place]) 02/16/2015  . Sepsis due to urinary tract infection ([place])   . Sepsis ([place]) 02/12/2015  . Urinary tract infectious disease   . Long-term (current) use of anticoagulants 07/12/2014  . Chronic anticoagulation 02/11/2014  . Hypertensive cardiovascular disease 01/24/2014  . Chronic diastolic CHF (congestive heart failure) ([place]) 01/24/2014  . Anemia 01/24/2014  . Breast cancer of upper-inner quadrant of right female breast ([place]) 01/24/2014  . Acute diastolic heart failure ([place]) 11/11/2013  . Atrial flutter ([place]) 11/11/2013  . PAF (paroxysmal atrial fibrillation) ([place]) 11/10/2013  . Slurred speech 11/10/2013  . Protein-calorie malnutrition, severe ([place]) 11/06/2013  . Coffee ground emesis 11/05/2013  . Dehydration 10/19/2013  . Urinary retention 10/18/2013  . GERD (gastroesophageal reflux disease) 03/20/2013  . S/P total knee arthroplasty   . UTI (urinary tract infection) 02/21/2013  . OA (osteoarthritis) of knee 02/19/2013  . Hypertension   . Thyroid disease   . Cancer of central portion of female breast ([place]) 04/14/2011    Current Outpatient Medications:  .  amiodarone (PACERONE) 200 MG tablet, Take 0.5 tablets (100 mg total) by mouth daily., Disp: 45 tablet, Rfl: 2 .  carvedilol (COREG) 12.5 MG tablet, Take 1 tablet (12.5 mg total) by mouth 2 (two) times  daily., Disp: 60 tablet, Rfl: 3 .  furosemide (LASIX) 20 MG tablet, Take 2 tablets (40 mg total) by mouth 2 (two) times daily., Disp: 150 tablet, Rfl: 3 .  HYDROcodone-acetaminophen (NORCO) 10-325 MG tablet, Take 1 tablet by mouth 3 (three) times daily as needed (for pain.). , Disp: , Rfl: 0 .  latanoprost (XALATAN) 0.005 % ophthalmic solution, Place 1 drop into both eyes at bedtime., Disp: , Rfl:  .  LORazepam (ATIVAN) 1 MG tablet, Take 1 mg by mouth at bedtime., Disp: , Rfl:  .  Macitentan (OPSUMIT) 10 MG TABS, Take 1 tablet (10 mg total) by mouth daily., Disp: 30 tablet, Rfl: 11 .  Melatonin 1 MG TABS, Take 6 mg by mouth at bedtime. , Disp: , Rfl:  .  Protein (PROCEL 100 PO), Take 1 scoop by mouth 2 (two) times daily., Disp: , Rfl:  .  tadalafil, PAH, (ADCIRCA) 20 MG tablet, Take 1 tablet (20 mg total) by mouth daily., Disp: 30 tablet, Rfl: 3 .  warfarin (COUMADIN) 4 MG tablet, TAKE 1/2 TO 1 TABLET BY MOUTH DAILY AS DIRECTED BY COUMADIN CLINIC (Patient taking differently: TAKE 0.5 TABLET (2 MG) BY MOUTH EVERY EVENING, EXCEPT TAKE 1 TABLET (4 MG) BY MOUTH ON MONDAYS DAILY IN THE EVENING AS DIRECTED BY COUMADIN CLINIC), Disp: 30 tablet, Rfl: 2 Allergies  Allergen Reactions  . Lactose Intolerance (Gi) Other (See Comments)    Stomach pain  . Milk-Related Compounds Other (See Comments)    Upset stomach   . Sulfa Antibiotics Itching      Social History   Socioeconomic History  . Marital status: Single  Spouse name: Not on file  . Number of children: Not on file  . Years of education: Not on file  . Highest education level: Not on file  Social Needs  . Financial resource strain: Not on file  . Food insecurity - worry: Not on file  . Food insecurity - inability: Not on file  . Transportation needs - medical: Not on file  . Transportation needs - non-medical: Not on file  Occupational History  . Not on file  Tobacco Use  . Smoking status: Former Smoker    Packs/day: 0.00     Years: 30.00    Pack years: 0.00    Types: Cigarettes    Last attempt to quit: 04/21/1975    Years since quitting: 42.0  . Smokeless tobacco: Never Used  Substance and Sexual Activity  . Alcohol use: Yes    Comment: occasionally  . Drug use: No  . Sexual activity: Not Currently  Other Topics Concern  . Not on file  Social History Narrative   From SNF, getting PT and using cane.      Physical Exam      Future Appointments  Date Time Provider [place]  05/18/2017 10:00 AM [facility] INJ 1 [facility]  05/18/2017 11:00 AM MC-NM 1 MC-NM [place]  05/18/2017 12:00 PM MC-NM 1 MC-NM [place]  05/19/2017 10:00 AM [facility] LAB [facility] None  06/07/2017  9:30 AM [facility]  06/09/2017  1:30 PM Causey, Charlestine Massed, NP [facility] None  06/13/2017 10:40 AM Larey Dresser, MD [facility] None  08/10/2017 10:20 AM Troy Sine, MD [facility]    BP 114/60   Pulse 60   Resp 15   Wt 158 lb (71.7 kg)   SpO2 97%   BMI 27.12 kg/m   Weight yesterday-157 @ clinic    Came out today for med rec--heather called me from clinic advised there were changes to her lasix dosing--cut back her dose to 40mg  BID of lasix so that change was made. Pt states she feels tired and sob this afternoon, she was out for a while earlier at doctors office and that seemed to wear her down and when she got home she was hurting to her back so she took a pain pill also.      Marylouise Stacks, [place] [facility] Paramedic  05/10/17

## 2017-05-10 NOTE — Patient Instructions (Addendum)
Medication Instructions:  Your physician recommends that you continue on your current medications as directed. Please refer to the Current Medication list given to you today  Follow-Up: Keep follow up with Dr. Aundra Dubin as scheduled  Follow up in 3 months with Dr. Claiborne Billings.   Any Other Special Instructions Will Be Listed Below (If Applicable).     If you need a refill on your cardiac medications before your next appointment, please call your pharmacy.

## 2017-05-11 DIAGNOSIS — R3914 Feeling of incomplete bladder emptying: Secondary | ICD-10-CM | POA: Diagnosis not present

## 2017-05-11 DIAGNOSIS — N312 Flaccid neuropathic bladder, not elsewhere classified: Secondary | ICD-10-CM | POA: Diagnosis not present

## 2017-05-11 LAB — RHEUMATOID FACTOR: Rhuematoid fact SerPl-aCnc: 12.1 IU/mL (ref 0.0–13.9)

## 2017-05-11 LAB — ANTI-JO 1 ANTIBODY, IGG: Anti JO-1: <0.2 AI (ref 0.0–0.9)

## 2017-05-11 LAB — ANTI-SCLERODERMA ANTIBODY

## 2017-05-11 LAB — ANA W/REFLEX: ANA: NEGATIVE

## 2017-05-13 ENCOUNTER — Telehealth (HOSPITAL_COMMUNITY): Payer: Self-pay | Admitting: Pharmacist

## 2017-05-13 NOTE — Telephone Encounter (Signed)
Ms. Eblen called concerned about the cost of her Opsumit ($2336/mo). She should be receiving a patient assistance application from [place] within a week and she received a 30 day supply from [place] to get her started. Katie Journalist, newspaper) notified and will assist patient with paperwork as needed.   Ruta Hinds. Velva Harman, PharmD, BCPS, CPP Clinical Pharmacist Phone: [phone] 05/13/2017 11:34 AM

## 2017-05-16 ENCOUNTER — Encounter: Payer: Self-pay | Admitting: Cardiovascular Disease

## 2017-05-16 ENCOUNTER — Other Ambulatory Visit (HOSPITAL_COMMUNITY): Payer: Self-pay

## 2017-05-16 NOTE — Progress Notes (Signed)
Paramedicine Encounter    Patient ID: Natasha Fletcher, female    DOB: [birth year], 84 y.o.   MRN: [id number]   Patient Care Team: Lorene Dy, MD as PCP - General (Internal Medicine) Troy Sine, MD as PCP - Cardiology (Cardiology) Constance Haw, MD as Consulting Physician (Cardiology)  Patient Active Problem List   Diagnosis Date Noted  . Atrial fibrillation ([place]) 03/22/2016  . Bradycardia 08/22/2015  . Junctional (nodal) bradycardia 08/22/2015  . Malnutrition of moderate degree 03/19/2015  . Benign essential HTN   . Bradycardia, drug induced 03/18/2015  . Hypokalemia 02/16/2015  . Thrombocytopenia ([place]) 02/16/2015  . Sepsis due to urinary tract infection ([place])   . Sepsis ([place]) 02/12/2015  . Urinary tract infectious disease   . Long-term (current) use of anticoagulants 07/12/2014  . Chronic anticoagulation 02/11/2014  . Hypertensive cardiovascular disease 01/24/2014  . Chronic diastolic CHF (congestive heart failure) ([place]) 01/24/2014  . Anemia 01/24/2014  . Breast cancer of upper-inner quadrant of right female breast ([place]) 01/24/2014  . Acute diastolic heart failure ([place]) 11/11/2013  . Atrial flutter ([place]) 11/11/2013  . PAF (paroxysmal atrial fibrillation) ([place]) 11/10/2013  . Slurred speech 11/10/2013  . Protein-calorie malnutrition, severe ([place]) 11/06/2013  . Coffee ground emesis 11/05/2013  . Dehydration 10/19/2013  . Urinary retention 10/18/2013  . GERD (gastroesophageal reflux disease) 03/20/2013  . S/P total knee arthroplasty   . UTI (urinary tract infection) 02/21/2013  . OA (osteoarthritis) of knee 02/19/2013  . Hypertension   . Thyroid disease   . Cancer of central portion of female breast ([place]) 04/14/2011    Current Outpatient Medications:  .  amiodarone (PACERONE) 200 MG tablet, Take 0.5 tablets (100 mg total) by mouth daily., Disp: 45 tablet, Rfl: 2 .  carvedilol (COREG) 12.5 MG tablet, Take 1 tablet (12.5 mg total) by mouth 2 (two) times  daily., Disp: 60 tablet, Rfl: 3 .  furosemide (LASIX) 20 MG tablet, Take 2 tablets (40 mg total) by mouth 2 (two) times daily., Disp: 150 tablet, Rfl: 3 .  HYDROcodone-acetaminophen (NORCO) 10-325 MG tablet, Take 1 tablet by mouth 3 (three) times daily as needed (for pain.). , Disp: , Rfl: 0 .  latanoprost (XALATAN) 0.005 % ophthalmic solution, Place 1 drop into both eyes at bedtime., Disp: , Rfl:  .  LORazepam (ATIVAN) 1 MG tablet, Take 1 mg by mouth at bedtime., Disp: , Rfl:  .  Macitentan (OPSUMIT) 10 MG TABS, Take 1 tablet (10 mg total) by mouth daily., Disp: 30 tablet, Rfl: 11 .  Melatonin 1 MG TABS, Take 6 mg by mouth at bedtime. , Disp: , Rfl:  .  warfarin (COUMADIN) 4 MG tablet, TAKE 1/2 TO 1 TABLET BY MOUTH DAILY AS DIRECTED BY COUMADIN CLINIC (Patient taking differently: TAKE 0.5 TABLET (2 MG) BY MOUTH EVERY EVENING, EXCEPT TAKE 1 TABLET (4 MG) BY MOUTH ON MONDAYS DAILY IN THE EVENING AS DIRECTED BY COUMADIN CLINIC), Disp: 30 tablet, Rfl: 2 .  Protein (PROCEL 100 PO), Take 1 scoop by mouth 2 (two) times daily., Disp: , Rfl:  .  tadalafil, PAH, (ADCIRCA) 20 MG tablet, Take 1 tablet (20 mg total) by mouth daily., Disp: 30 tablet, Rfl: 3 Allergies  Allergen Reactions  . Lactose Intolerance (Gi) Other (See Comments)    Stomach pain  . Milk-Related Compounds Other (See Comments)    Upset stomach   . Sulfa Antibiotics Itching      Social History   Socioeconomic History  . Marital status: Single  Spouse name: Not on file  . Number of children: Not on file  . Years of education: Not on file  . Highest education level: Not on file  Social Needs  . Financial resource strain: Not on file  . Food insecurity - worry: Not on file  . Food insecurity - inability: Not on file  . Transportation needs - medical: Not on file  . Transportation needs - non-medical: Not on file  Occupational History  . Not on file  Tobacco Use  . Smoking status: Former Smoker    Packs/day: 0.00     Years: 30.00    Pack years: 0.00    Types: Cigarettes    Last attempt to quit: 04/21/1975    Years since quitting: 42.0  . Smokeless tobacco: Never Used  Substance and Sexual Activity  . Alcohol use: Yes    Comment: occasionally  . Drug use: No  . Sexual activity: Not Currently  Other Topics Concern  . Not on file  Social History Narrative   From SNF, getting PT and using cane.      Physical Exam      Future Appointments  Date Time Provider [place]  05/18/2017 10:00 AM [facility] INJ 1 [facility]  05/18/2017 11:00 AM MC-NM 1 MC-NM [place]  05/18/2017 12:00 PM MC-NM 1 MC-NM [place]  05/19/2017 10:00 AM [facility] LAB [facility] None  06/07/2017  9:30 AM [facility]  06/09/2017  1:30 PM Causey, Charlestine Massed, NP [facility] None  06/13/2017 10:40 AM Larey Dresser, MD [facility] None  08/10/2017 10:20 AM Troy Sine, MD [facility]    BP 118/60   Pulse 60   Resp 15   Wt 155 lb (70.3 kg)   SpO2 97%   BMI 26.61 kg/m   Weight yesterday-155 Last visit weight-158  Pt reports she is feeling good, she has not gotten the adcirca yet--no change with her sob--comes and goes and increases when she is up and walking around the house.  meds verified and pill box refilled. Reminded her of her upcoming appointments this week-she wasn't aware of any appointments and I called her brother to let him know as well and he is able to take her. No swelling noted.  She is awaing the med assist app for the opsumit.  She has not heard from a nurse yet about the referral from the urologist.  She is continuing using the melatonin and she states it seems to be working majority of the time.  She reports her appetite isnt that great.  She is trying to get benefits from [place] as well but she states they are slow with the paperwork and getting things started.  Marylouise Stacks, [facility] Paramedic  05/16/17

## 2017-05-18 ENCOUNTER — Encounter (HOSPITAL_COMMUNITY)
Admission: RE | Admit: 2017-05-18 | Discharge: 2017-05-18 | Disposition: A | Discharge status: Home / Self Care | Payer: Medicare HMO | Source: Ambulatory Visit | Attending: Cardiology | Admitting: Cardiology

## 2017-05-18 ENCOUNTER — Encounter (HOSPITAL_COMMUNITY): Payer: Medicare HMO

## 2017-05-18 DIAGNOSIS — E859 Amyloidosis, unspecified: Secondary | ICD-10-CM | POA: Diagnosis present

## 2017-05-18 DIAGNOSIS — I509 Heart failure, unspecified: Secondary | ICD-10-CM | POA: Diagnosis not present

## 2017-05-18 MED ORDER — TECHNETIUM TC 99M PYROPHOSPHATE
20.0000 | Freq: Once | INTRAVENOUS | Status: AC
  Administered 2017-05-18: 20 via INTRAVENOUS
  Filled 2017-05-18: qty 20

## 2017-05-19 ENCOUNTER — Ambulatory Visit (HOSPITAL_COMMUNITY)
Admission: RE | Admit: 2017-05-19 | Discharge: 2017-05-19 | Disposition: A | Discharge status: Home / Self Care | Payer: Medicare HMO | Source: Ambulatory Visit | Attending: Internal Medicine | Admitting: Internal Medicine

## 2017-05-19 DIAGNOSIS — I5032 Chronic diastolic (congestive) heart failure: Secondary | ICD-10-CM | POA: Diagnosis not present

## 2017-05-19 LAB — BASIC METABOLIC PANEL
ANION GAP: 11 (ref 5–15)
BUN: 37 mg/dL — ABNORMAL HIGH (ref 6–20)
CALCIUM: 9.1 mg/dL (ref 8.9–10.3)
CO2: 25 mmol/L (ref 22–32)
CREATININE: 1.9 mg/dL — AB (ref 0.44–1.00)
Chloride: 104 mmol/L (ref 101–111)
GFR, EST AFRICAN AMERICAN: 26 mL/min — AB (ref >60)
GFR, EST NON AFRICAN AMERICAN: 22 mL/min — AB (ref >60)
Glucose, Bld: 113 mg/dL — ABNORMAL HIGH (ref 65–99)
Potassium: 4.3 mmol/L (ref 3.5–5.1)
SODIUM: 140 mmol/L (ref 135–145)

## 2017-05-20 ENCOUNTER — Telehealth (HOSPITAL_COMMUNITY): Payer: Self-pay | Admitting: Pharmacist

## 2017-05-20 NOTE — Telephone Encounter (Signed)
Tadalafil PA approved by [employer] through 04/04/18.   Ruta Hinds. Velva Harman, PharmD, BCPS, CPP Clinical Pharmacist Phone: [phone] 05/20/2017 11:27 AM

## 2017-05-23 ENCOUNTER — Telehealth (HOSPITAL_COMMUNITY): Payer: Self-pay | Admitting: Pharmacist

## 2017-05-23 ENCOUNTER — Other Ambulatory Visit (HOSPITAL_COMMUNITY): Payer: Self-pay

## 2017-05-23 NOTE — Telephone Encounter (Signed)
Katie Journalist, newspaper) assisted Ms. Siek in an approval for assistance through [place] with her [place] medications (Opsumit and Adcirca).    ID: [id number] Grp: CDFPAHFA BIN: [id number] PCN: [id number]  Nazly Digilio K. Velva Harman, PharmD, BCPS, CPP Clinical Pharmacist Phone: [phone] 05/23/2017 12:09 PM

## 2017-05-23 NOTE — Progress Notes (Addendum)
Paramedicine Encounter    Patient ID: Natasha Fletcher, female    DOB: January 11, 1926, 84 y.o.   MRN: [id number]   Patient Care Team: Lorene Dy, MD as PCP - General (Internal Medicine) Troy Sine, MD as PCP - Cardiology (Cardiology) Constance Haw, MD as Consulting Physician (Cardiology)  Patient Active Problem List   Diagnosis Date Noted  . Atrial fibrillation ([place]) 03/22/2016  . Bradycardia 08/22/2015  . Junctional (nodal) bradycardia 08/22/2015  . Malnutrition of moderate degree 03/19/2015  . Benign essential HTN   . Bradycardia, drug induced 03/18/2015  . Hypokalemia 02/16/2015  . Thrombocytopenia ([place]) 02/16/2015  . Sepsis due to urinary tract infection ([place])   . Sepsis ([place]) 02/12/2015  . Urinary tract infectious disease   . Long-term (current) use of anticoagulants 07/12/2014  . Chronic anticoagulation 02/11/2014  . Hypertensive cardiovascular disease 01/24/2014  . Chronic diastolic CHF (congestive heart failure) ([place]) 01/24/2014  . Anemia 01/24/2014  . Breast cancer of upper-inner quadrant of right female breast ([place]) 01/24/2014  . Acute diastolic heart failure ([place]) 11/11/2013  . Atrial flutter ([place]) 11/11/2013  . PAF (paroxysmal atrial fibrillation) ([place]) 11/10/2013  . Slurred speech 11/10/2013  . Protein-calorie malnutrition, severe ([place]) 11/06/2013  . Coffee ground emesis 11/05/2013  . Dehydration 10/19/2013  . Urinary retention 10/18/2013  . GERD (gastroesophageal reflux disease) 03/20/2013  . S/P total knee arthroplasty   . UTI (urinary tract infection) 02/21/2013  . OA (osteoarthritis) of knee 02/19/2013  . Hypertension   . Thyroid disease   . Cancer of central portion of female breast ([place]) 04/14/2011    Current Outpatient Medications:  .  amiodarone (PACERONE) 200 MG tablet, Take 0.5 tablets (100 mg total) by mouth daily., Disp: 45 tablet, Rfl: 2 .  carvedilol (COREG) 12.5 MG tablet, Take 1 tablet (12.5 mg total) by mouth 2 (two) times  daily., Disp: 60 tablet, Rfl: 3 .  furosemide (LASIX) 20 MG tablet, Take 2 tablets (40 mg total) by mouth 2 (two) times daily., Disp: 150 tablet, Rfl: 3 .  HYDROcodone-acetaminophen (NORCO) 10-325 MG tablet, Take 1 tablet by mouth 3 (three) times daily as needed (for pain.). , Disp: , Rfl: 0 .  latanoprost (XALATAN) 0.005 % ophthalmic solution, Place 1 drop into both eyes at bedtime., Disp: , Rfl:  .  Macitentan (OPSUMIT) 10 MG TABS, Take 1 tablet (10 mg total) by mouth daily., Disp: 30 tablet, Rfl: 11 .  Melatonin 1 MG TABS, Take 6 mg by mouth at bedtime. , Disp: , Rfl:  .  warfarin (COUMADIN) 4 MG tablet, TAKE 1/2 TO 1 TABLET BY MOUTH DAILY AS DIRECTED BY COUMADIN CLINIC (Patient taking differently: TAKE 0.5 TABLET (2 MG) BY MOUTH EVERY EVENING, EXCEPT TAKE 1 TABLET (4 MG) BY MOUTH ON MONDAYS DAILY IN THE EVENING AS DIRECTED BY COUMADIN CLINIC), Disp: 30 tablet, Rfl: 2 .  LORazepam (ATIVAN) 1 MG tablet, Take 1 mg by mouth at bedtime., Disp: , Rfl:  .  Protein (PROCEL 100 PO), Take 1 scoop by mouth 2 (two) times daily., Disp: , Rfl:  .  tadalafil, PAH, (ADCIRCA) 20 MG tablet, Take 1 tablet (20 mg total) by mouth daily. (Patient not taking: Reported on 05/23/2017), Disp: 30 tablet, Rfl: 3 Allergies  Allergen Reactions  . Lactose Intolerance (Gi) Other (See Comments)    Stomach pain  . Milk-Related Compounds Other (See Comments)    Upset stomach   . Sulfa Antibiotics Itching      Social History   Socioeconomic History  .  Marital status: Single    Spouse name: Not on file  . Number of children: Not on file  . Years of education: Not on file  . Highest education level: Not on file  Social Needs  . Financial resource strain: Not on file  . Food insecurity - worry: Not on file  . Food insecurity - inability: Not on file  . Transportation needs - medical: Not on file  . Transportation needs - non-medical: Not on file  Occupational History  . Not on file  Tobacco Use  . Smoking status:  Former Smoker    Packs/day: 0.00    Years: 30.00    Pack years: 0.00    Types: Cigarettes    Last attempt to quit: 04/21/1975    Years since quitting: 42.1  . Smokeless tobacco: Never Used  Substance and Sexual Activity  . Alcohol use: Yes    Comment: occasionally  . Drug use: No  . Sexual activity: Not Currently  Other Topics Concern  . Not on file  Social History Narrative   From SNF, getting PT and using cane.      Physical Exam      Future Appointments  Date Time Provider [place]  06/07/2017  9:30 AM [facility]  06/09/2017  1:30 PM Causey, Charlestine Massed, NP [facility] None  06/13/2017 10:40 AM Larey Dresser, MD [facility] None  08/10/2017 10:20 AM Troy Sine, MD [facility]    BP 130/68   Pulse 62   Resp 16   Wt 155 lb (70.3 kg)   SpO2 96%   BMI 26.61 kg/m   Weight yesterday-156 Last visit weight-155  Pt reports she is not feeling well today, it seems every time she takes her meds in the morning she feels sick afterwards--she states she takes her meds 1st then eats shortly after-I suggested she eat first then take her meds to see if that will help at all. She states her breathing not good today-when she feels sick it makes her breathing worse. No missed doses of her meds. meds verified and pill box refilled.  She doesn't have the adcirca yet-I seen a note that her insurance approved it.  She went back to get some papers from her bedroom and she came back and she was very sob--her 02 dropped to 85%-she sat down at her 02 increased back to 98%.  The company for opsumit called during our visit and I was able to speak with them with the pt and the company advised it was going to be a $2200 co-pay-they do offer med assist and see if she is approved for the grant --[facility]-household size and income--we called the company and it was determined she was  Account ID# [id number] for her application   ~[phone] after insurance is taken out ~she does qualify for assistance -it was also asked if she takes any other medicines for PAH and I advised she also is prescribed adcirca and that was approved also.  [employer] [phone] 05-23-17 thru 04-04-18 or until she reaches $10k   So now I will call [employer] next and relay this info down below  to them to send out the medicine.  This info was given to [place] as well.   PF#[id number] Group#- CDFPAHFA Bin#-[id number] PCN#- [id number]  Call back this afternoon to order the meds--  Marylouise Stacks, [place] Paramedic  05/23/17

## 2017-05-26 ENCOUNTER — Other Ambulatory Visit (HOSPITAL_COMMUNITY): Payer: Self-pay | Admitting: Pharmacist

## 2017-05-26 MED ORDER — TADALAFIL (PAH) 20 MG PO TABS: 20.0000 mg | ORAL_TABLET | Freq: Every day | ORAL | 11 refills | Status: DC

## 2017-05-30 ENCOUNTER — Other Ambulatory Visit (HOSPITAL_COMMUNITY): Payer: Self-pay

## 2017-05-30 NOTE — Progress Notes (Signed)
Paramedicine Encounter    Patient ID: Natasha Fletcher, female    DOB: Mar 16, 1926, 84 y.o.   MRN: [id number]   Patient Care Team: Lorene Dy, MD as PCP - General (Internal Medicine) Troy Sine, MD as PCP - Cardiology (Cardiology) Constance Haw, MD as Consulting Physician (Cardiology)  Patient Active Problem List   Diagnosis Date Noted  . Atrial fibrillation ([place]) 03/22/2016  . Bradycardia 08/22/2015  . Junctional (nodal) bradycardia 08/22/2015  . Malnutrition of moderate degree 03/19/2015  . Benign essential HTN   . Bradycardia, drug induced 03/18/2015  . Hypokalemia 02/16/2015  . Thrombocytopenia ([place]) 02/16/2015  . Sepsis due to urinary tract infection ([place])   . Sepsis ([place]) 02/12/2015  . Urinary tract infectious disease   . Long-term (current) use of anticoagulants 07/12/2014  . Chronic anticoagulation 02/11/2014  . Hypertensive cardiovascular disease 01/24/2014  . Chronic diastolic CHF (congestive heart failure) ([place]) 01/24/2014  . Anemia 01/24/2014  . Breast cancer of upper-inner quadrant of right female breast ([place]) 01/24/2014  . Acute diastolic heart failure ([place]) 11/11/2013  . Atrial flutter ([place]) 11/11/2013  . PAF (paroxysmal atrial fibrillation) ([place]) 11/10/2013  . Slurred speech 11/10/2013  . Protein-calorie malnutrition, severe ([place]) 11/06/2013  . Coffee ground emesis 11/05/2013  . Dehydration 10/19/2013  . Urinary retention 10/18/2013  . GERD (gastroesophageal reflux disease) 03/20/2013  . S/P total knee arthroplasty   . UTI (urinary tract infection) 02/21/2013  . OA (osteoarthritis) of knee 02/19/2013  . Hypertension   . Thyroid disease   . Cancer of central portion of female breast ([place]) 04/14/2011    Current Outpatient Medications:  .  amiodarone (PACERONE) 200 MG tablet, Take 0.5 tablets (100 mg total) by mouth daily., Disp: 45 tablet, Rfl: 2 .  carvedilol (COREG) 12.5 MG tablet, Take 1 tablet (12.5 mg total) by mouth 2 (two) times  daily., Disp: 60 tablet, Rfl: 3 .  furosemide (LASIX) 20 MG tablet, Take 2 tablets (40 mg total) by mouth 2 (two) times daily., Disp: 150 tablet, Rfl: 3 .  HYDROcodone-acetaminophen (NORCO) 10-325 MG tablet, Take 1 tablet by mouth 3 (three) times daily as needed (for pain.). , Disp: , Rfl: 0 .  latanoprost (XALATAN) 0.005 % ophthalmic solution, Place 1 drop into both eyes at bedtime., Disp: , Rfl:  .  LORazepam (ATIVAN) 1 MG tablet, Take 1 mg by mouth at bedtime., Disp: , Rfl:  .  Macitentan (OPSUMIT) 10 MG TABS, Take 1 tablet (10 mg total) by mouth daily., Disp: 30 tablet, Rfl: 11 .  Melatonin 1 MG TABS, Take 6 mg by mouth at bedtime. , Disp: , Rfl:  .  warfarin (COUMADIN) 4 MG tablet, TAKE 1/2 TO 1 TABLET BY MOUTH DAILY AS DIRECTED BY COUMADIN CLINIC (Patient taking differently: TAKE 0.5 TABLET (2 MG) BY MOUTH EVERY EVENING, EXCEPT TAKE 1 TABLET (4 MG) BY MOUTH ON MONDAYS DAILY IN THE EVENING AS DIRECTED BY COUMADIN CLINIC), Disp: 30 tablet, Rfl: 2 .  Protein (PROCEL 100 PO), Take 1 scoop by mouth 2 (two) times daily., Disp: , Rfl:  .  tadalafil, PAH, (ADCIRCA) 20 MG tablet, Take 1 tablet (20 mg total) by mouth daily. (Patient not taking: Reported on 05/30/2017), Disp: 30 tablet, Rfl: 11 Allergies  Allergen Reactions  . Lactose Intolerance (Gi) Other (See Comments)    Stomach pain  . Milk-Related Compounds Other (See Comments)    Upset stomach   . Sulfa Antibiotics Itching      Social History   Socioeconomic History  .  Marital status: Single    Spouse name: Not on file  . Number of children: Not on file  . Years of education: Not on file  . Highest education level: Not on file  Social Needs  . Financial resource strain: Not on file  . Food insecurity - worry: Not on file  . Food insecurity - inability: Not on file  . Transportation needs - medical: Not on file  . Transportation needs - non-medical: Not on file  Occupational History  . Not on file  Tobacco Use  . Smoking  status: Former Smoker    Packs/day: 0.00    Years: 30.00    Pack years: 0.00    Types: Cigarettes    Last attempt to quit: 04/21/1975    Years since quitting: 42.1  . Smokeless tobacco: Never Used  Substance and Sexual Activity  . Alcohol use: Yes    Comment: occasionally  . Drug use: No  . Sexual activity: Not Currently  Other Topics Concern  . Not on file  Social History Narrative   From SNF, getting PT and using cane.      Physical Exam      Future Appointments  Date Time Provider [place]  06/07/2017  9:30 AM [facility]  06/09/2017  1:30 PM Gardenia Phlegm, NP [facility] None  06/13/2017 10:40 AM Larey Dresser, MD [facility] None  08/10/2017 10:20 AM Troy Sine, MD [facility]    BP (!) 110/54   Pulse 62   Resp 14   Wt 153 lb (69.4 kg)   SpO2 95%   BMI 26.26 kg/m   Weight yesterday-154 Last visit weight-155  Pt reports she isnt feeling well this morning, she forgot to eat first then take her meds so now she doesn't feel well.  I called to order her adcirca during visit and it will be delivered on Wednesday.  meds verified and pill box refilled.  Confirmed with Doroteo Bradford that her losartan is still d/c- it appears her pharmacy has auto refill for her meds so I called them to d/c her losartan and also to stop the auto refill on lasix and carvedilol due to her having plenty for now and will call when refill is needed.    Marylouise Stacks, [place] [facility] Paramedic  05/30/17

## 2017-06-06 ENCOUNTER — Other Ambulatory Visit (HOSPITAL_COMMUNITY): Payer: Self-pay

## 2017-06-06 DIAGNOSIS — D485 Neoplasm of uncertain behavior of skin: Secondary | ICD-10-CM | POA: Diagnosis not present

## 2017-06-06 DIAGNOSIS — K5900 Constipation, unspecified: Secondary | ICD-10-CM | POA: Diagnosis not present

## 2017-06-06 NOTE — Progress Notes (Signed)
Paramedicine Encounter    Patient ID: Natasha Fletcher, female    DOB: [birth year], 84 y.o.   MRN: [id number]   Patient Care Team: Lorene Dy, MD as PCP - General (Internal Medicine) Troy Sine, MD as PCP - Cardiology (Cardiology) Constance Haw, MD as Consulting Physician (Cardiology)  Patient Active Problem List   Diagnosis Date Noted  . Atrial fibrillation ([place]) 03/22/2016  . Bradycardia 08/22/2015  . Junctional (nodal) bradycardia 08/22/2015  . Malnutrition of moderate degree 03/19/2015  . Benign essential HTN   . Bradycardia, drug induced 03/18/2015  . Hypokalemia 02/16/2015  . Thrombocytopenia ([place]) 02/16/2015  . Sepsis due to urinary tract infection ([place])   . Sepsis ([place]) 02/12/2015  . Urinary tract infectious disease   . Long-term (current) use of anticoagulants 07/12/2014  . Chronic anticoagulation 02/11/2014  . Hypertensive cardiovascular disease 01/24/2014  . Chronic diastolic CHF (congestive heart failure) ([place]) 01/24/2014  . Anemia 01/24/2014  . Breast cancer of upper-inner quadrant of right female breast ([place]) 01/24/2014  . Acute diastolic heart failure ([place]) 11/11/2013  . Atrial flutter ([place]) 11/11/2013  . PAF (paroxysmal atrial fibrillation) ([place]) 11/10/2013  . Slurred speech 11/10/2013  . Protein-calorie malnutrition, severe ([place]) 11/06/2013  . Coffee ground emesis 11/05/2013  . Dehydration 10/19/2013  . Urinary retention 10/18/2013  . GERD (gastroesophageal reflux disease) 03/20/2013  . S/P total knee arthroplasty   . UTI (urinary tract infection) 02/21/2013  . OA (osteoarthritis) of knee 02/19/2013  . Hypertension   . Thyroid disease   . Cancer of central portion of female breast ([place]) 04/14/2011    Current Outpatient Medications:  .  amiodarone (PACERONE) 200 MG tablet, Take 0.5 tablets (100 mg total) by mouth daily., Disp: 45 tablet, Rfl: 2 .  carvedilol (COREG) 12.5 MG tablet, Take 1 tablet (12.5 mg total) by mouth 2 (two) times  daily., Disp: 60 tablet, Rfl: 3 .  furosemide (LASIX) 20 MG tablet, Take 2 tablets (40 mg total) by mouth 2 (two) times daily., Disp: 150 tablet, Rfl: 3 .  HYDROcodone-acetaminophen (NORCO) 10-325 MG tablet, Take 1 tablet by mouth 3 (three) times daily as needed (for pain.). , Disp: , Rfl: 0 .  latanoprost (XALATAN) 0.005 % ophthalmic solution, Place 1 drop into both eyes at bedtime., Disp: , Rfl:  .  LORazepam (ATIVAN) 1 MG tablet, Take 1 mg by mouth at bedtime., Disp: , Rfl:  .  Macitentan (OPSUMIT) 10 MG TABS, Take 1 tablet (10 mg total) by mouth daily., Disp: 30 tablet, Rfl: 11 .  Melatonin 1 MG TABS, Take 6 mg by mouth at bedtime. , Disp: , Rfl:  .  Protein (PROCEL 100 PO), Take 1 scoop by mouth 2 (two) times daily., Disp: , Rfl:  .  tadalafil, PAH, (ADCIRCA) 20 MG tablet, Take 1 tablet (20 mg total) by mouth daily., Disp: 30 tablet, Rfl: 11 .  warfarin (COUMADIN) 4 MG tablet, TAKE 1/2 TO 1 TABLET BY MOUTH DAILY AS DIRECTED BY COUMADIN CLINIC (Patient taking differently: TAKE 0.5 TABLET (2 MG) BY MOUTH EVERY EVENING, EXCEPT TAKE 1 TABLET (4 MG) BY MOUTH ON MONDAYS DAILY IN THE EVENING AS DIRECTED BY COUMADIN CLINIC), Disp: 30 tablet, Rfl: 2 Allergies  Allergen Reactions  . Lactose Intolerance (Gi) Other (See Comments)    Stomach pain  . Milk-Related Compounds Other (See Comments)    Upset stomach   . Sulfa Antibiotics Itching      Social History   Socioeconomic History  . Marital status: Single  Spouse name: Not on file  . Number of children: Not on file  . Years of education: Not on file  . Highest education level: Not on file  Social Needs  . Financial resource strain: Not on file  . Food insecurity - worry: Not on file  . Food insecurity - inability: Not on file  . Transportation needs - medical: Not on file  . Transportation needs - non-medical: Not on file  Occupational History  . Not on file  Tobacco Use  . Smoking status: Former Smoker    Packs/day: 0.00     Years: 30.00    Pack years: 0.00    Types: Cigarettes    Last attempt to quit: 04/21/1975    Years since quitting: 42.1  . Smokeless tobacco: Never Used  Substance and Sexual Activity  . Alcohol use: Yes    Comment: occasionally  . Drug use: No  . Sexual activity: Not Currently  Other Topics Concern  . Not on file  Social History Narrative   From SNF, getting PT and using cane.      Physical Exam      Future Appointments  Date Time Provider [place]  06/07/2017  9:30 AM [facility]  06/09/2017  1:30 PM Causey, Charlestine Massed, NP [facility] None  06/13/2017 10:40 AM Larey Dresser, MD [facility] None  08/10/2017 10:20 AM Troy Sine, MD [facility]    BP (!) 108/54   Pulse 60   Resp 18   Wt 153 lb (69.4 kg)   SpO2 97%   BMI 26.26 kg/m   Weight yesterday-153  Last visit weight-155  Pt reports she isnt feeling well today, she took her meds after she ate and it seems to still make her feel "like her eyes are going to pop out" .  No missed doses of her meds. She reports very poor sleeping at night. She empties her foley cath 2-3x a night. Not sure if this is due from worrying about her health or what.  She reports thinking a lot of how she used to be and now she cant do like that anymore. She also reports that she is itching at night time and that keeps her up-I advised her to ask her doctor if she can take a benadryl for that.  She got the adcirca over the wknd so that was started today.  She reports slight abd pain and no BM in 2wks. She is going to her PCP today this afternoon. She has coumadin clinic appointment tomor so I will f/u after that and if her dose needs changing then I will plan to come out to change it.  No changes with her sob--still sob upon short exertion.  Her appetite is decreased. No swelling noted. Her legs are painful to touch which that isnt new for her.  I assisted in writing a few questions down on  paper for her to ask her PCP today.   Marylouise Stacks, [facility] Paramedic  06/06/17

## 2017-06-07 ENCOUNTER — Telehealth (HOSPITAL_COMMUNITY): Payer: Self-pay

## 2017-06-07 ENCOUNTER — Ambulatory Visit (INDEPENDENT_AMBULATORY_CARE_PROVIDER_SITE_OTHER): Payer: Medicare HMO | Admitting: Pharmacist

## 2017-06-07 DIAGNOSIS — I480 Paroxysmal atrial fibrillation: Secondary | ICD-10-CM | POA: Diagnosis not present

## 2017-06-07 DIAGNOSIS — Z7901 Long term (current) use of anticoagulants: Secondary | ICD-10-CM | POA: Diagnosis not present

## 2017-06-07 LAB — POCT INR: INR: 3.5

## 2017-06-07 NOTE — Telephone Encounter (Signed)
Pt called me to report her coumadin check today she was told to hold her dose for tonight. I told her I can come over and fix her pill box for those directions but she reports she can do it, she knows exactly which pill to remove and which day. I feel comfortable with her being able to do this. Will see her again next week.   Marylouise Stacks, EMT-Paramedic  06/07/17

## 2017-06-09 ENCOUNTER — Telehealth (HOSPITAL_COMMUNITY): Payer: Self-pay | Admitting: *Deleted

## 2017-06-09 ENCOUNTER — Other Ambulatory Visit: Payer: Self-pay | Admitting: Cardiovascular Disease

## 2017-06-09 ENCOUNTER — Telehealth: Payer: Self-pay | Admitting: Adult Health

## 2017-06-09 ENCOUNTER — Other Ambulatory Visit (HOSPITAL_COMMUNITY): Payer: Self-pay

## 2017-06-09 ENCOUNTER — Encounter: Payer: Medicare HMO | Admitting: Adult Health

## 2017-06-09 NOTE — Progress Notes (Signed)
Pt called to report she has had increased sob, her weight is up to 156 lbs today. per clinic she is to increase lasix to 60mg  tonight and 60mg  in the morning. After she walked around after she opened the door she was puffing sob, her 02 sat right after that was 91%. After she sat for a few min it increased to 96%.  That dose was added to her pill box.   B/p-130/56 p-62 r-20 sp02-91% initially   Came up to 96% after rest [place], EMT-Paramedic 06/09/17

## 2017-06-09 NOTE — Telephone Encounter (Signed)
Patient called today stating she has been feeling bad for the past week but today she is having increased shortness of breath. Weight yesterday was 153 lbs and today it is 156 lbs.    I spoke with Barrington Ellison, PA and he advises patient to increase her lasix to 60 mg for the next two doses.  I called Katie with paramedicine who is going to visit patient this afternoon and she will follow up with her and have her increase her lasix for the next two doses.

## 2017-06-09 NOTE — Telephone Encounter (Signed)
Patient left vm to cancel she is sick and will call back to reschedule

## 2017-06-10 ENCOUNTER — Telehealth: Payer: Self-pay | Admitting: Adult Health

## 2017-06-10 NOTE — Telephone Encounter (Signed)
Returned call to patient in order to Reschedule her LTS visit she missed on 3/7/ per 3/8 phone msg

## 2017-06-13 ENCOUNTER — Encounter (HOSPITAL_COMMUNITY): Payer: Self-pay

## 2017-06-13 ENCOUNTER — Emergency Department (HOSPITAL_COMMUNITY): Payer: Medicare HMO

## 2017-06-13 ENCOUNTER — Other Ambulatory Visit: Payer: Self-pay

## 2017-06-13 ENCOUNTER — Inpatient Hospital Stay (HOSPITAL_COMMUNITY)
Admission: EM | Admit: 2017-06-13 | Discharge: 2017-06-20 | DRG: 291 | Disposition: A | Discharge status: Skilled Nursing Facility | Payer: Medicare HMO | Attending: Family Medicine | Admitting: Family Medicine

## 2017-06-13 ENCOUNTER — Encounter (HOSPITAL_COMMUNITY): Payer: Medicare HMO | Admitting: Cardiology

## 2017-06-13 DIAGNOSIS — Z515 Encounter for palliative care: Secondary | ICD-10-CM

## 2017-06-13 DIAGNOSIS — E1122 Type 2 diabetes mellitus with diabetic chronic kidney disease: Secondary | ICD-10-CM | POA: Diagnosis not present

## 2017-06-13 DIAGNOSIS — R4189 Other symptoms and signs involving cognitive functions and awareness: Secondary | ICD-10-CM | POA: Diagnosis not present

## 2017-06-13 DIAGNOSIS — Z96653 Presence of artificial knee joint, bilateral: Secondary | ICD-10-CM | POA: Diagnosis present

## 2017-06-13 DIAGNOSIS — Z96643 Presence of artificial hip joint, bilateral: Secondary | ICD-10-CM | POA: Diagnosis present

## 2017-06-13 DIAGNOSIS — Z79899 Other long term (current) drug therapy: Secondary | ICD-10-CM

## 2017-06-13 DIAGNOSIS — I495 Sick sinus syndrome: Secondary | ICD-10-CM

## 2017-06-13 DIAGNOSIS — I130 Hypertensive heart and chronic kidney disease with heart failure and stage 1 through stage 4 chronic kidney disease, or unspecified chronic kidney disease: Principal | ICD-10-CM | POA: Diagnosis present

## 2017-06-13 DIAGNOSIS — F0390 Unspecified dementia without behavioral disturbance: Secondary | ICD-10-CM | POA: Diagnosis present

## 2017-06-13 DIAGNOSIS — N184 Chronic kidney disease, stage 4 (severe): Secondary | ICD-10-CM | POA: Diagnosis present

## 2017-06-13 DIAGNOSIS — R0789 Other chest pain: Secondary | ICD-10-CM | POA: Diagnosis present

## 2017-06-13 DIAGNOSIS — R079 Chest pain, unspecified: Secondary | ICD-10-CM | POA: Diagnosis not present

## 2017-06-13 DIAGNOSIS — Z960 Presence of urogenital implants: Secondary | ICD-10-CM | POA: Diagnosis present

## 2017-06-13 DIAGNOSIS — I43 Cardiomyopathy in diseases classified elsewhere: Secondary | ICD-10-CM | POA: Diagnosis not present

## 2017-06-13 DIAGNOSIS — H409 Unspecified glaucoma: Secondary | ICD-10-CM | POA: Diagnosis present

## 2017-06-13 DIAGNOSIS — R69 Illness, unspecified: Secondary | ICD-10-CM | POA: Diagnosis not present

## 2017-06-13 DIAGNOSIS — E854 Organ-limited amyloidosis: Secondary | ICD-10-CM | POA: Diagnosis not present

## 2017-06-13 DIAGNOSIS — E079 Disorder of thyroid, unspecified: Secondary | ICD-10-CM | POA: Diagnosis present

## 2017-06-13 DIAGNOSIS — Z7189 Other specified counseling: Secondary | ICD-10-CM | POA: Diagnosis not present

## 2017-06-13 DIAGNOSIS — Z87891 Personal history of nicotine dependence: Secondary | ICD-10-CM

## 2017-06-13 DIAGNOSIS — E739 Lactose intolerance, unspecified: Secondary | ICD-10-CM | POA: Diagnosis present

## 2017-06-13 DIAGNOSIS — N179 Acute kidney failure, unspecified: Secondary | ICD-10-CM | POA: Diagnosis not present

## 2017-06-13 DIAGNOSIS — R4181 Age-related cognitive decline: Secondary | ICD-10-CM | POA: Diagnosis present

## 2017-06-13 DIAGNOSIS — Z7901 Long term (current) use of anticoagulants: Secondary | ICD-10-CM | POA: Diagnosis not present

## 2017-06-13 DIAGNOSIS — R278 Other lack of coordination: Secondary | ICD-10-CM | POA: Diagnosis not present

## 2017-06-13 DIAGNOSIS — R0602 Shortness of breath: Secondary | ICD-10-CM | POA: Diagnosis not present

## 2017-06-13 DIAGNOSIS — I509 Heart failure, unspecified: Secondary | ICD-10-CM

## 2017-06-13 DIAGNOSIS — I110 Hypertensive heart disease with heart failure: Secondary | ICD-10-CM | POA: Diagnosis not present

## 2017-06-13 DIAGNOSIS — F8081 Childhood onset fluency disorder: Secondary | ICD-10-CM | POA: Diagnosis present

## 2017-06-13 DIAGNOSIS — I4891 Unspecified atrial fibrillation: Secondary | ICD-10-CM | POA: Diagnosis not present

## 2017-06-13 DIAGNOSIS — F05 Delirium due to known physiological condition: Secondary | ICD-10-CM | POA: Diagnosis not present

## 2017-06-13 DIAGNOSIS — J45909 Unspecified asthma, uncomplicated: Secondary | ICD-10-CM | POA: Diagnosis present

## 2017-06-13 DIAGNOSIS — Z936 Other artificial openings of urinary tract status: Secondary | ICD-10-CM | POA: Diagnosis not present

## 2017-06-13 DIAGNOSIS — D631 Anemia in chronic kidney disease: Secondary | ICD-10-CM | POA: Diagnosis present

## 2017-06-13 DIAGNOSIS — I959 Hypotension, unspecified: Secondary | ICD-10-CM | POA: Diagnosis not present

## 2017-06-13 DIAGNOSIS — I5032 Chronic diastolic (congestive) heart failure: Secondary | ICD-10-CM | POA: Diagnosis not present

## 2017-06-13 DIAGNOSIS — Z950 Presence of cardiac pacemaker: Secondary | ICD-10-CM

## 2017-06-13 DIAGNOSIS — R41841 Cognitive communication deficit: Secondary | ICD-10-CM | POA: Diagnosis not present

## 2017-06-13 DIAGNOSIS — I5033 Acute on chronic diastolic (congestive) heart failure: Secondary | ICD-10-CM

## 2017-06-13 DIAGNOSIS — I480 Paroxysmal atrial fibrillation: Secondary | ICD-10-CM | POA: Diagnosis present

## 2017-06-13 DIAGNOSIS — Z882 Allergy status to sulfonamides status: Secondary | ICD-10-CM

## 2017-06-13 DIAGNOSIS — M6281 Muscle weakness (generalized): Secondary | ICD-10-CM | POA: Diagnosis not present

## 2017-06-13 DIAGNOSIS — I2721 Secondary pulmonary arterial hypertension: Secondary | ICD-10-CM | POA: Diagnosis not present

## 2017-06-13 DIAGNOSIS — I071 Rheumatic tricuspid insufficiency: Secondary | ICD-10-CM | POA: Diagnosis present

## 2017-06-13 DIAGNOSIS — M1990 Unspecified osteoarthritis, unspecified site: Secondary | ICD-10-CM | POA: Diagnosis present

## 2017-06-13 DIAGNOSIS — R498 Other voice and resonance disorders: Secondary | ICD-10-CM | POA: Diagnosis not present

## 2017-06-13 DIAGNOSIS — R402413 Glasgow coma scale score 13-15, at hospital admission: Secondary | ICD-10-CM | POA: Diagnosis present

## 2017-06-13 DIAGNOSIS — R7989 Other specified abnormal findings of blood chemistry: Secondary | ICD-10-CM | POA: Diagnosis present

## 2017-06-13 LAB — BASIC METABOLIC PANEL
Anion gap: 8 (ref 5–15)
BUN: 34 mg/dL — AB (ref 6–20)
CHLORIDE: 105 mmol/L (ref 101–111)
CO2: 23 mmol/L (ref 22–32)
Calcium: 8.9 mg/dL (ref 8.9–10.3)
Creatinine, Ser: 1.89 mg/dL — ABNORMAL HIGH (ref 0.44–1.00)
GFR calc Af Amer: 26 mL/min — ABNORMAL LOW (ref >60)
GFR calc non Af Amer: 22 mL/min — ABNORMAL LOW (ref >60)
Glucose, Bld: 98 mg/dL (ref 65–99)
POTASSIUM: 4 mmol/L (ref 3.5–5.1)
SODIUM: 136 mmol/L (ref 135–145)

## 2017-06-13 LAB — HEPATIC FUNCTION PANEL
ALBUMIN: 3.6 g/dL (ref 3.5–5.0)
ALT: 13 U/L — AB (ref 14–54)
AST: 24 U/L (ref 15–41)
Alkaline Phosphatase: 88 U/L (ref 38–126)
Bilirubin, Direct: 0.2 mg/dL (ref 0.1–0.5)
Indirect Bilirubin: 1 mg/dL — ABNORMAL HIGH (ref 0.3–0.9)
TOTAL PROTEIN: 7.5 g/dL (ref 6.5–8.1)
Total Bilirubin: 1.2 mg/dL (ref 0.3–1.2)

## 2017-06-13 LAB — CBC
HEMATOCRIT: 30.1 % — AB (ref 36.0–46.0)
HEMOGLOBIN: 9.3 g/dL — AB (ref 12.0–15.0)
MCH: 25.3 pg — ABNORMAL LOW (ref 26.0–34.0)
MCHC: 30.9 g/dL (ref 30.0–36.0)
MCV: 81.8 fL (ref 78.0–100.0)
Platelets: 222 10*3/uL (ref 150–400)
RBC: 3.68 MIL/uL — ABNORMAL LOW (ref 3.87–5.11)
RDW: 18.5 % — AB (ref 11.5–15.5)
WBC: 4.8 10*3/uL (ref 4.0–10.5)

## 2017-06-13 LAB — I-STAT TROPONIN, ED: Troponin i, poc: 0.05 ng/mL (ref 0.00–0.08)

## 2017-06-13 LAB — HEMOGLOBIN A1C
Hgb A1c MFr Bld: 5.1 % (ref 4.8–5.6)
Mean Plasma Glucose: 99.67 mg/dL

## 2017-06-13 LAB — BRAIN NATRIURETIC PEPTIDE: B NATRIURETIC PEPTIDE 5: 553 pg/mL — AB (ref 0.0–100.0)

## 2017-06-13 LAB — TROPONIN I
Troponin I: 0.08 ng/mL (ref <0.03)
Troponin I: 0.08 ng/mL (ref <0.03)

## 2017-06-13 LAB — PROTIME-INR
INR: 3.26
PROTHROMBIN TIME: 32.9 s — AB (ref 11.4–15.2)

## 2017-06-13 LAB — MAGNESIUM: MAGNESIUM: 2.5 mg/dL — AB (ref 1.7–2.4)

## 2017-06-13 LAB — TSH: TSH: 0.536 u[IU]/mL (ref 0.350–4.500)

## 2017-06-13 MED ORDER — ACETAMINOPHEN 650 MG RE SUPP: 650.0000 mg | Freq: Four times a day (QID) | RECTAL | Status: DC | PRN

## 2017-06-13 MED ORDER — OXYCODONE HCL 5 MG PO TABS
5.0000 mg | ORAL_TABLET | Freq: Three times a day (TID) | ORAL | Status: DC | PRN
  Administered 2017-06-13: 5 mg via ORAL
  Filled 2017-06-13: qty 1

## 2017-06-13 MED ORDER — MACITENTAN 10 MG PO TABS
10.0000 mg | ORAL_TABLET | Freq: Every day | ORAL | Status: DC
  Administered 2017-06-15 – 2017-06-20 (×6): 10 mg via ORAL
  Filled 2017-06-13 (×6): qty 1

## 2017-06-13 MED ORDER — AMIODARONE HCL 100 MG PO TABS
100.0000 mg | ORAL_TABLET | Freq: Every day | ORAL | Status: DC
  Administered 2017-06-13 – 2017-06-20 (×8): 100 mg via ORAL
  Filled 2017-06-13 (×8): qty 1

## 2017-06-13 MED ORDER — HYDROCODONE-ACETAMINOPHEN 10-325 MG PO TABS
1.0000 | ORAL_TABLET | Freq: Three times a day (TID) | ORAL | Status: DC | PRN
  Administered 2017-06-13 – 2017-06-17 (×11): 1 via ORAL
  Filled 2017-06-13 (×11): qty 1

## 2017-06-13 MED ORDER — WARFARIN - PHARMACIST DOSING INPATIENT
Freq: Every day | Status: DC
  Administered 2017-06-15 – 2017-06-16 (×2)
  Administered 2017-06-18: 1

## 2017-06-13 MED ORDER — SODIUM CHLORIDE 0.9% FLUSH
3.0000 mL | Freq: Two times a day (BID) | INTRAVENOUS | Status: DC
  Administered 2017-06-13 – 2017-06-20 (×12): 3 mL via INTRAVENOUS

## 2017-06-13 MED ORDER — ACETAMINOPHEN 325 MG PO TABS
650.0000 mg | ORAL_TABLET | Freq: Four times a day (QID) | ORAL | Status: DC | PRN
  Administered 2017-06-15 (×2): 650 mg via ORAL
  Filled 2017-06-13 (×2): qty 2

## 2017-06-13 MED ORDER — TADALAFIL (PAH) 20 MG PO TABS
20.0000 mg | ORAL_TABLET | Freq: Every day | ORAL | Status: DC
  Administered 2017-06-13 – 2017-06-20 (×8): 20 mg via ORAL
  Filled 2017-06-13 (×8): qty 1

## 2017-06-13 MED ORDER — FUROSEMIDE 10 MG/ML IJ SOLN
40.0000 mg | Freq: Once | INTRAMUSCULAR | Status: AC
  Administered 2017-06-13: 40 mg via INTRAVENOUS
  Filled 2017-06-13: qty 4

## 2017-06-13 MED ORDER — LATANOPROST 0.005 % OP SOLN
1.0000 [drp] | Freq: Every day | OPHTHALMIC | Status: DC
  Administered 2017-06-13 – 2017-06-19 (×7): 1 [drp] via OPHTHALMIC
  Filled 2017-06-13: qty 2.5

## 2017-06-13 NOTE — ED Notes (Signed)
Heart Healthy Lunch Tray Ordered @ 1317-per Ronalee Belts, RN-called by Levada Dy

## 2017-06-13 NOTE — ED Triage Notes (Signed)
Pt states she has had shortness of breath and dizziness that has worsened over the last few days. Pt alert and oriented. Does appear to have some shortness of breath in triage.

## 2017-06-13 NOTE — ED Triage Notes (Signed)
Urinary leg bag changed to A large urinary bag.

## 2017-06-13 NOTE — ED Notes (Signed)
Emptied pt leg bag, changed pt clothing and linen due to leaking leg bag. Notified Michael(RN)

## 2017-06-13 NOTE — Progress Notes (Signed)
ANTICOAGULATION CONSULT NOTE - Initial Consult  Pharmacy Consult for Coumadin Indication: atrial fibrillation  Allergies  Allergen Reactions  . Lactose Intolerance (Gi) Other (See Comments)    Stomach pain  . Milk-Related Compounds Other (See Comments)    Upset stomach   . Sulfa Antibiotics Itching    Patient Measurements: TBW 70.8 kg  Assessment: 84 yo F presents with SOB. On Coumadin 2mg  daily exc 4mg  on Mon PTA for Afib. Last dose was on 3/10. INR slightly elevated at 3.26. Hgb 9.3, plts wnl.  Goal of Therapy:  INR 2-3 Monitor platelets by anticoagulation protocol: Yes   Plan:  Hold Coumadin today Monitor daily INR, CBC, s/s of bleed  Floella Ensz J 06/13/2017,3:40 PM

## 2017-06-13 NOTE — ED Triage Notes (Signed)
Pt called out for her pain meds. Pt refuses Tylenol . Pt informed DR was called but no order for oxycodone . Pt continues to refuse Tylenol.

## 2017-06-13 NOTE — ED Notes (Signed)
Admitting Provider at bedside. 

## 2017-06-13 NOTE — H&P (Addendum)
[facility] Admission History and Physical Service Pager: [phone]  [name] Medical record number: [id number] Date of birth: [birth year] Age: 84 y.o. Gender: female  Primary Care Provider: Lorene Dy, MD Consultants: none Code Status: FULL, confirmed on admission   Chief Complaint: shortness of breath  Assessment and Plan: Natasha Fletcher is a 84 y.o. female presenting with shortness of breath x 2-3 days. PMH is significant for HFpEF (last echo 02/2017 hyperdynamic LV function, severe LVH, g3DD, moderate RV dilation, severe TR, severe pulm HTN), anemia (baseline Hgb 9-10), sick sinus syndrome with pacemaker in place, afib on warfarin, hx of R breast cancer, T2DM (last A1c unknown), HTN, CKD 3.   Shortness of breath: Patient reports worsened SOB x 2-3 days at home. Lives alone, SOB much worse with walking. Suspect fluid overload given CXR and recent increase in lasix prior to arrival (from 40 BID to 60mg  BID). Question role of gut edema in decreased absorption of PO lasix. S/p IV lasix 40mg  once in ED. Patient uses a suprapubic catheter with Foley bag at home, and nurses state they are not sure about her urine output thus far after 12 PM Lasix dose. BNP 553. Patient states weight up at home x 1 day, she is unsure of dry weight. Less likely ACS, as patient without significant EKG changes and atypical history.  Less likely PE as she is anticoagulated at home.  Less likely pneumonia given chest x-ray with fluid overload, no fevers, nonproductive cough.  Will have PT and OT evaluate her, as she reports that she lives alone and has limited function in her ADLs secondary to shortness of breath. -Admit to telemetry, Dr. Andria Frames attending -Strict I's and O's -Daily weights -Reassess p.m. Lasix need -FYI HF team, Jonni Sanger will let Dr. Aundra Dubin know she is admitted (patient follows with paramedicine) -PT/OT  Chest pain: patient without known CAD, risk factors  include HTN, DM, african [place]. HEART score 4. History not classical (intermittent, not associated with exertion, no longer present). EKG with poor capture in ED, repeat now. Appears unchanged.  -repeat EKG now -trend troponins  -am EKG  AKI on CKDIII: patient with CKD III/IV at baseline (Cr 1.4-1.6). Cr today 1.89, question due to outpatient increase in diuretics. Will observe with diuresis as above. Losartan discontinued prior to arrival by HF team. -Daily BMP -hold ARB -avoid nephrotoxic agents  Afib: on warfarin managed by Dr. Claiborne Billings (cards). Atrial pacer in place. Hx of sick sinus syndrome. Home meds are amiodarone, coreg. -continue amiodarone -hold coreg for bradycardia -continue coumadin per pharm  Acute on chronic HFpEF: Cards conducting outpatient workup for restrictive disease given severe LVH.  PET scan 05/18/2017 suggestive of amyloidosis. -Diuresis as above -hold home coreg 2/2 bradycardia  Pulm HTN: severe on most recent echo. Patient on opsumit and adcirca at home per cards. These were started ~05/23/17, question whether opsumit can cause some fluid retention. VQ scan 05/2017 to rule out chronic PE as a cause of pulm HTN.  -continue adcirca and opsumit -appreciate HF recs  Anemia: baseline appears to be 9-10. Suspect ACD, although no iron studies in our system (PCP doesn't appear to be in epic).  -daily CBC  -consider iron studies  T2DM: on problem list per cards. PCP not in epic.  -repeat A1c  Suprapubic catheter: Patient reports this is due to urinating too much.  Unclear reason for placement as her urologist is not in epic. -RN has attached catheter to Foley bag -  Would need to replace catheter if concern for UTI, no concern for this at present  HTN: previously on losartan, but held 05/19/17.  -hold home coreg 2/2 bradycardia  OA: hx of R TKR in 2015. Norco on home med list.  -APAP PRN  Question cognitive decline: Patient seems to have limited understanding of  her disease state.  Repeatedly asks if heart failure is "serious."  No dementia meds on problem list.  Does live alone. -Consider outpatient memory eval  Hx of breast cancer: patient reports hx of biopsy without treatment and yearly follow up.   Abnormal speech: patient stutters, per chart review appears to be chronic.  -no new neurological workup  FEN/GI: heart healthy diet Prophylaxis: home warfarin  Disposition: admit to tele  History of Present Illness:  Natasha Fletcher is a 84 y.o. female presenting with shortness of breath and generally feeling unwell.  She has a history of heart failure with preserved ejection fraction, follows with heart failure.  Medicine team.  Reports that they manage her medications via a pillbox.  She thinks they have increased her diuretics lately.  She states that her shortness of breath has been going on for 2-3 days.  She suggests that she gets very short of breath even with standing.  She notes that she lives alone with her dog.  Shortness of breath is worsened with bending to get dishes in her kitchen.  Denies chest pain right now, however has had some sharp substernal chest pain intermittently, last this morning, not associated with exertion.  States her weight was up yesterday.  Does not recall her dry weight.  Denies sick contacts.  Endorses rhinorrhea, clear.  Thinks she has had chills.  Denies fevers.  Nonproductive cough, reports remote history of inhaler use.  Reports that her breast cancer was many years ago and she follows yearly.  Also endorses decreased appetite lately.  She is retired from [employer].  Seems to have limited understanding of her disease state, repeatedly questions me about whether heart failure is "serious."  Additionally, she reports she uses a suprapubic catheter, "for urinating too much," and follows with urology.  In the ED, EKG without new changes.  Troponin normal.  Chest x-ray with appearance of fluid overload.  Vital signs stable.   IV Lasix 40 once given.  Review Of Systems: Per HPI with the following additions: Endorses chills, denies fever, denies dysuria, denies changes in bowel movement.  ROS  Patient Active Problem List   Diagnosis Date Noted  . Atrial fibrillation ([place]) 03/22/2016  . Bradycardia 08/22/2015  . Junctional (nodal) bradycardia 08/22/2015  . Malnutrition of moderate degree 03/19/2015  . Benign essential HTN   . Bradycardia, drug induced 03/18/2015  . Hypokalemia 02/16/2015  . Thrombocytopenia ([place]) 02/16/2015  . Sepsis due to urinary tract infection ([place])   . Sepsis ([place]) 02/12/2015  . Urinary tract infectious disease   . Long-term (current) use of anticoagulants 07/12/2014  . Chronic anticoagulation 02/11/2014  . Hypertensive cardiovascular disease 01/24/2014  . Chronic diastolic CHF (congestive heart failure) ([place]) 01/24/2014  . Anemia 01/24/2014  . Breast cancer of upper-inner quadrant of right female breast ([place]) 01/24/2014  . Acute diastolic heart failure ([place]) 11/11/2013  . Atrial flutter ([place]) 11/11/2013  . PAF (paroxysmal atrial fibrillation) ([place]) 11/10/2013  . Slurred speech 11/10/2013  . Protein-calorie malnutrition, severe ([place]) 11/06/2013  . Coffee ground emesis 11/05/2013  . Dehydration 10/19/2013  . Urinary retention 10/18/2013  . GERD (gastroesophageal reflux disease) 03/20/2013  .  S/P total knee arthroplasty   . UTI (urinary tract infection) 02/21/2013  . OA (osteoarthritis) of knee 02/19/2013  . Hypertension   . Thyroid disease   . Cancer of central portion of female breast ([place]) 04/14/2011    Past Medical History: Past Medical History:  Diagnosis Date  . Arthritis    "all over"  . Asthma   . Atrial fibrillation ([place])    a. s/p DCCV in 01/2014 b. repeat DCCV in 06/2015, started on Amiodarone at that time. On Coumadin for anticoagulation.  . Cancer ([place])    rt breast  . Chronic diastolic CHF (congestive heart failure) ([place])    a. 08/2015: Echo with EF of  60-65%, no WMA, LA mod dilated, mild TR.  . Diabetes mellitus without complication ([place])   . Edema leg   . Fast breathing    "because of pill"  . GERD (gastroesophageal reflux disease)   . Glaucoma   . Glaucoma   . Hypertension   . Incontinence   . Pneumonia    h/o younger  . S/P total knee arthroplasty   . SSS (sick sinus syndrome) ([place])    a. s/p Medtronic Advisa DR MRI [id number] (serial number [id number]) PPM placement in 08/2015  . Swelling    bilateral feet/ legs, more left.  . Thyroid disease    "something years ago"  . UTI (urinary tract infection) 02/12/2015  . Wears dentures   . Wears glasses     Past Surgical History: Past Surgical History:  Procedure Laterality Date  . ABDOMINAL HYSTERECTOMY    . APPENDECTOMY    . BREAST BIOPSY  04/27/2011   Procedure: BREAST BIOPSY WITH NEEDLE LOCALIZATION;  Surgeon: Edward Jolly, MD;  Location: [facility];  Service: General;  Laterality: Right;  right needle localized breast lumpectomy   . BREAST LUMPECTOMY  04/27/11   right breast by [place]  . CARDIOVERSION N/A 02/01/2014   Procedure: CARDIOVERSION;  Surgeon: Candee Furbish, MD;  Location[facility] ENDOSCOPY;  Service: Cardiovascular;  Laterality: N/A;  . CARDIOVERSION N/A 06/04/2015   Procedure: CARDIOVERSION;  Surgeon: Dorothy Spark, MD;  Location: [facility];  Service: Cardiovascular;  Laterality: N/A;  . CARDIOVERSION N/A 07/14/2015   Procedure: CARDIOVERSION;  Surgeon: Thayer Headings, MD;  Location: [facility] ENDOSCOPY;  Service: Cardiovascular;  Laterality: N/A;  . CARPAL TUNNEL RELEASE Bilateral   . COLONOSCOPY    . EP IMPLANTABLE DEVICE N/A 08/22/2015   Procedure: Pacemaker Implant;  Surgeon: Will Meredith Leeds, MD;  Medtronic Advisa DR MRI [id number] (serial number [id number]);  Laterality: N/A;  . EYE SURGERY     both cataracts  . RIGHT HEART CATH N/A 04/21/2017   Procedure: RIGHT HEART CATH;  Surgeon: Larey Dresser, MD;  Location: [facility] CV LAB;   Service: Cardiovascular;  Laterality: N/A;  . SHOULDER SURGERY Left   . TEE WITHOUT CARDIOVERSION N/A 02/01/2014   Procedure: TRANSESOPHAGEAL ECHOCARDIOGRAM (TEE);  Surgeon: Candee Furbish, MD;  Location[facility] ENDOSCOPY;  Service: Cardiovascular;  Laterality: N/A;  . TONSILLECTOMY    . TOTAL HIP ARTHROPLASTY Bilateral   . TOTAL KNEE ARTHROPLASTY Right 02/19/2013   Procedure: RIGHT TOTAL KNEE ARTHROPLASTY;  Surgeon: Gearlean Alf, MD;  Location: [facility] ORS;  Service: Orthopedics;  Laterality: Right;  . TOTAL KNEE ARTHROPLASTY Left 10/15/2013   Procedure: LEFT TOTAL KNEE ARTHROPLASTY;  Surgeon: Gearlean Alf, MD;  Location: [facility] ORS;  Service: Orthopedics;  Laterality: Left;    Social History:  Social History   Tobacco Use  . Smoking status: Former Smoker    Packs/day: 0.00    Years: 30.00    Pack years: 0.00    Types: Cigarettes    Last attempt to quit: 04/21/1975    Years since quitting: 42.1  . Smokeless tobacco: Never Used  Substance Use Topics  . Alcohol use: Yes    Comment: occasionally  . Drug use: No   Additional social history: Lives alone with her dog, brother lives nearby Please also refer to relevant sections of EMR.  Family History: Family History  Problem Relation Age of Onset  . Heart disease Mother   . Stroke Mother   . Hypertension Mother   . Stroke Father   . Hypertension Father   . Stroke Brother   . Heart attack Neg Hx     Allergies and Medications: Allergies  Allergen Reactions  . Lactose Intolerance (Gi) Other (See Comments)    Stomach pain  . Milk-Related Compounds Other (See Comments)    Upset stomach   . Sulfa Antibiotics Itching   No current facility-administered medications on file prior to encounter.    Current Outpatient Medications on File Prior to Encounter  Medication Sig Dispense Refill  . amiodarone (PACERONE) 200 MG tablet Take 0.5 tablets (100 mg total) by mouth daily. 45 tablet 2  . carvedilol (COREG) 12.5 MG tablet Take 1 tablet  (12.5 mg total) by mouth 2 (two) times daily. 60 tablet 3  . furosemide (LASIX) 20 MG tablet Take 2 tablets (40 mg total) by mouth 2 (two) times daily. 150 tablet 3  . HYDROcodone-acetaminophen (NORCO) 10-325 MG tablet Take 1 tablet by mouth 3 (three) times daily as needed (for pain.).   0  . latanoprost (XALATAN) 0.005 % ophthalmic solution Place 1 drop into both eyes at bedtime.    Marland Kitchen losartan (COZAAR) 25 MG tablet Take 25 mg by mouth daily.  6  . Macitentan (OPSUMIT) 10 MG TABS Take 1 tablet (10 mg total) by mouth daily. 30 tablet 11  . tadalafil, PAH, (ADCIRCA) 20 MG tablet Take 1 tablet (20 mg total) by mouth daily. 30 tablet 11  . traZODone (DESYREL) 50 MG tablet Take 50 mg by mouth at bedtime.  2  . warfarin (COUMADIN) 4 MG tablet TAKE 1/2 TO 1 TABLET BY MOUTH DAILY AS DIRECTED BY COUMADIN CLINIC 30 tablet 2  . warfarin (COUMADIN) 4 MG tablet TAKE 1/2 TO 1 TABLET BY MOUTH DAILY AS DIRECTED BY COUMADIN CLINIC (Patient taking differently: TAKE 0.5 TABLET (2 MG) BY MOUTH EVERY EVENING, EXCEPT TAKE 1 TABLET (4 MG) BY MOUTH ON MONDAYS DAILY IN THE EVENING AS DIRECTED BY COUMADIN CLINIC) 30 tablet 2    Objective: BP (!) 157/73   Pulse 62   Temp 99 F (37.2 C) (Oral)   Resp 18   SpO2 98%  Exam: General: elderly female sitting up in bed in NAD Eyes: Arcus senilis bilaterally, PEERLA, EOMI ENTM: MMM, poor dentition Neck: supple Cardiovascular: III/VI pansystolic murmur Respiratory: scattered wheezes, crackles in bilateral bases Gastrointestinal: SNTND, suprapubic cath in place MSK: normal muscle tone and bulk for age Derm: no rashes on visualized skin Neuro: AOx3, no gross cranial nerve deficits Psych: mood and affect appropriate  Labs and Imaging: CBC BMET  Recent Labs  Lab 06/13/17 0915  WBC 4.8  HGB 9.3*  HCT 30.1*  PLT 222   Recent Labs  Lab 06/13/17 0915  NA 136  K 4.0  CL 105  CO2  23  BUN 34*  CREATININE 1.89*  GLUCOSE 98  CALCIUM 8.9     Trop 0.05  Dg Chest  2 View  Result Date: 06/13/2017 CLINICAL DATA:  Shortness of breath and chest pain for 3-4 days. EXAM: CHEST - 2 VIEW COMPARISON:  05/06/2017 FINDINGS: Diffuse interstitial opacity with small pleural effusions. Chronic cardiomegaly. Stable positioning of dual-chamber pacer leads from the left. Vascular pedicle widening and aortic tortuosity. IMPRESSION: Diffuse lung opacity and small pleural effusions favoring CHF. Electronically Signed   By: Monte Fantasia M.D.   On: 06/13/2017 10:06   Nm Tumor Localization W Spect  Result Date: 05/18/2017 CLINICAL DATA:  HEART FAILURE. CONCERN FOR CARDIAC AMYLOIDOSIS. EXAM: NUCLEAR MEDICINE TUMOR LOCALIZATION. PYP CARDIAC AMYLOIDOSIS SCAN WITH SPECT TECHNIQUE: Following intravenous administration of radiopharmaceutical, anterior planar images of the chest were obtained. Regions of interest were placed on the heart and contralateral chest wall for quantitative assessment. Additional SPECT imaging of the chest was obtained. RADIOPHARMACEUTICALS:  20.2 mCi TECHNETIUM 99 PYROPHOSPHATE FINDINGS: Planar Visual assessment: Anterior planar imaging demonstrates radiotracer uptake within the heart greater than uptake within the adjacent ribs (Grade 3). Quantitative assessment : Quantitative assessment of the cardiac uptake compared to the contralateral chest wall is equal to 1.36 . SPECT assessment: SPECT imaging of the chest demonstrates clear radiotracer accumulation within the LEFT ventricle. IMPRESSION: Visual and quantitative assessment (grade 3, H/CLL equal 1.36) are strongly suggestive of transthyretin amyloidosis. Electronically Signed   By: Kerby Moors M.D.   On: 05/18/2017 13:57    Sela Hilding, MD 06/13/2017, 12:02 PM PGY-2, [place] [facility] pager: [phone], text pages welcome

## 2017-06-13 NOTE — ED Triage Notes (Signed)
PT  Requesting Oxycodone for pain . Pt has Tylenol ordered but reports she does not take tylenol. Admitting service called to report Pt request for Oxycodone . No new orders given.

## 2017-06-13 NOTE — ED Provider Notes (Signed)
[facility] EMERGENCY DEPARTMENT Provider Note   CSN: [id number] Arrival date & time: 06/13/17  6222     History   Chief Complaint Chief Complaint  Patient presents with  . Chest Pain  . Shortness of Breath    HPI Natasha Fletcher is a 84 y.o. female.  HPI She has chronic shortness of breath with history of pulmonary hypertension and congestive heart failure.  Symptoms however have been worse for about the past 2-3 days.  She reports she is short of breath at rest any with mild activity.  She at times gets a central chest discomfort which can occur at rest.  No fever no cough.  some increased swelling of her legs.  Patient reports her Lasix dose was recently increased but she is not sure of the milligrams dosage.  She takes it twice a day. Past Medical History:  Diagnosis Date  . Arthritis    "all over"  . Asthma   . Atrial fibrillation ([place])    a. s/p DCCV in 01/2014 b. repeat DCCV in 06/2015, started on Amiodarone at that time. On Coumadin for anticoagulation.  . Cancer ([place])    rt breast  . Chronic diastolic CHF (congestive heart failure) ([place])    a. 08/2015: Echo with EF of 60-65%, no WMA, LA mod dilated, mild TR.  . Diabetes mellitus without complication ([place])   . Edema leg   . Fast breathing    "because of pill"  . GERD (gastroesophageal reflux disease)   . Glaucoma   . Glaucoma   . Hypertension   . Incontinence   . Pneumonia    h/o younger  . S/P total knee arthroplasty   . SSS (sick sinus syndrome) ([place])    a. s/p Medtronic Advisa DR MRI [id number] (serial number [id number]) PPM placement in 08/2015  . Swelling    bilateral feet/ legs, more left.  . Thyroid disease    "something years ago"  . UTI (urinary tract infection) 02/12/2015  . Wears dentures   . Wears glasses     Patient Active Problem List   Diagnosis Date Noted  . Atrial fibrillation ([place]) 03/22/2016  . Bradycardia 08/22/2015  . Junctional (nodal) bradycardia 08/22/2015    . Malnutrition of moderate degree 03/19/2015  . Benign essential HTN   . Bradycardia, drug induced 03/18/2015  . Hypokalemia 02/16/2015  . Thrombocytopenia ([place]) 02/16/2015  . Sepsis due to urinary tract infection ([place])   . Sepsis ([place]) 02/12/2015  . Urinary tract infectious disease   . Long-term (current) use of anticoagulants 07/12/2014  . Chronic anticoagulation 02/11/2014  . Hypertensive cardiovascular disease 01/24/2014  . Chronic diastolic CHF (congestive heart failure) ([place]) 01/24/2014  . Anemia 01/24/2014  . Breast cancer of upper-inner quadrant of right female breast ([place]) 01/24/2014  . Acute diastolic heart failure ([place]) 11/11/2013  . Atrial flutter ([place]) 11/11/2013  . PAF (paroxysmal atrial fibrillation) ([place]) 11/10/2013  . Slurred speech 11/10/2013  . Protein-calorie malnutrition, severe ([place]) 11/06/2013  . Coffee ground emesis 11/05/2013  . Dehydration 10/19/2013  . Urinary retention 10/18/2013  . GERD (gastroesophageal reflux disease) 03/20/2013  . S/P total knee arthroplasty   . UTI (urinary tract infection) 02/21/2013  . OA (osteoarthritis) of knee 02/19/2013  . Hypertension   . Thyroid disease   . Cancer of central portion of female breast ([place]) 04/14/2011    Past Surgical History:  Procedure Laterality Date  . ABDOMINAL HYSTERECTOMY    . APPENDECTOMY    .  BREAST BIOPSY  04/27/2011   Procedure: BREAST BIOPSY WITH NEEDLE LOCALIZATION;  Surgeon: Edward Jolly, MD;  Location: [facility];  Service: General;  Laterality: Right;  right needle localized breast lumpectomy   . BREAST LUMPECTOMY  04/27/11   right breast by [place]  . CARDIOVERSION N/A 02/01/2014   Procedure: CARDIOVERSION;  Surgeon: Candee Furbish, MD;  Location[facility] ENDOSCOPY;  Service: Cardiovascular;  Laterality: N/A;  . CARDIOVERSION N/A 06/04/2015   Procedure: CARDIOVERSION;  Surgeon: Dorothy Spark, MD;  Location: [facility];  Service: Cardiovascular;  Laterality: N/A;  .  CARDIOVERSION N/A 07/14/2015   Procedure: CARDIOVERSION;  Surgeon: Thayer Headings, MD;  Location: [facility] ENDOSCOPY;  Service: Cardiovascular;  Laterality: N/A;  . CARPAL TUNNEL RELEASE Bilateral   . COLONOSCOPY    . EP IMPLANTABLE DEVICE N/A 08/22/2015   Procedure: Pacemaker Implant;  Surgeon: Will Meredith Leeds, MD;  Medtronic Advisa DR MRI [id number] (serial number [id number]);  Laterality: N/A;  . EYE SURGERY     both cataracts  . RIGHT HEART CATH N/A 04/21/2017   Procedure: RIGHT HEART CATH;  Surgeon: Larey Dresser, MD;  Location: [facility] CV LAB;  Service: Cardiovascular;  Laterality: N/A;  . SHOULDER SURGERY Left   . TEE WITHOUT CARDIOVERSION N/A 02/01/2014   Procedure: TRANSESOPHAGEAL ECHOCARDIOGRAM (TEE);  Surgeon: Candee Furbish, MD;  Location: [facility] ENDOSCOPY;  Service: Cardiovascular;  Laterality: N/A;  . TONSILLECTOMY    . TOTAL HIP ARTHROPLASTY Bilateral   . TOTAL KNEE ARTHROPLASTY Right 02/19/2013   Procedure: RIGHT TOTAL KNEE ARTHROPLASTY;  Surgeon: Gearlean Alf, MD;  Location: [facility] OR[facility];  Service: Orthopedics;  Laterality: Right;  . TOTAL KNEE ARTHROPLASTY Left 10/15/2013   Procedure: LEFT TOTAL KNEE ARTHROPLASTY;  Surgeon: Gearlean Alf, MD;  Location: [facility] OR[facility];  Service: Orthopedics;  Laterality: Left;    OB History    No data available       Home Medications    Prior to Admission medications   Medication Sig Start Date End Date Taking? Authorizing Provider  amiodarone (PACERONE) 200 MG tablet Take 0.5 tablets (100 mg total) by mouth daily. 03/01/17  Yes Camnitz, Will Hassell Done, MD  carvedilol (COREG) 12.5 MG tablet Take 1 tablet (12.5 mg total) by mouth 2 (two) times daily. 05/09/17  Yes Larey Dresser, MD  furosemide (LASIX) 20 MG tablet Take 2 tablets (40 mg total) by mouth 2 (two) times daily. 05/10/17  Yes Larey Dresser, MD  HYDROcodone-acetaminophen [facility]) 10-325 MG tablet Take 1 tablet by mouth 3 (three) times daily as needed (for pain.).  11/03/15  Yes [provider]  latanoprost (XALATAN) 0.005 % ophthalmic solution Place 1 drop into both eyes at bedtime.   Yes [provider]  losartan (COZAAR) 25 MG tablet Take 25 mg by mouth daily. 05/24/17  Yes [provider]  Macitentan (OPSUMIT) 10 MG TABS Take 1 tablet (10 mg total) by mouth daily. 05/04/17  Yes Larey Dresser, MD  tadalafil, PAH, (ADCIRCA) 20 MG tablet Take 1 tablet (20 mg total) by mouth daily. 05/26/17  Yes Larey Dresser, MD  traZODone (DESYREL) 50 MG tablet Take 50 mg by mouth at bedtime. 06/07/17  Yes [provider]  warfarin (COUMADIN) 4 MG tablet TAKE 1/2 TO 1 TABLET BY MOUTH DAILY AS DIRECTED BY COUMADIN CLINIC 06/09/17  Yes Troy Sine, MD  warfarin (COUMADIN) 4 MG tablet TAKE 1/2 TO 1 TABLET BY MOUTH DAILY AS DIRECTED BY COUMADIN CLINIC  Patient taking differently: TAKE 0.5 TABLET (2 MG) BY MOUTH EVERY EVENING, EXCEPT TAKE 1 TABLET (4 MG) BY MOUTH ON MONDAYS DAILY IN THE EVENING AS DIRECTED BY COUMADIN CLINIC 12/14/16   Troy Sine, MD    Family History Family History  Problem Relation Age of Onset  . Heart disease Mother   . Stroke Mother   . Hypertension Mother   . Stroke Father   . Hypertension Father   . Stroke Brother   . Heart attack Neg Hx     Social History Social History   Tobacco Use  . Smoking status: Former Smoker    Packs/day: 0.00    Years: 30.00    Pack years: 0.00    Types: Cigarettes    Last attempt to quit: 04/21/1975    Years since quitting: 42.1  . Smokeless tobacco: Never Used  Substance Use Topics  . Alcohol use: Yes    Comment: occasionally  . Drug use: No     Allergies   Lactose intolerance (gi); Milk-related compounds; and Sulfa antibiotics   Review of Systems Review of Systems 10 Systems reviewed and are negative for acute change except as noted in the HPI.   Physical Exam Updated Vital Signs BP (!) 157/73   Pulse 62   Temp 99 F (37.2 C) (Oral)   Resp 18   SpO2 98%   Physical  Exam  Constitutional: She is oriented to person, place, and time. She appears well-developed and well-nourished. No distress.  HENT:  Head: Normocephalic and atraumatic.  Mouth/Throat: Oropharynx is clear and moist.  Eyes: Conjunctivae are normal.  Neck: Neck supple.  Cardiovascular: Normal rate and regular rhythm.  Murmur heard. 2\6 systolic ejection murmur.  Pulmonary/Chest: Effort normal. No respiratory distress. She has rales.  Bilateral basilar rales to mid lung fields.  Abdominal: Soft. There is no tenderness.  Musculoskeletal: She exhibits edema.  1+ pitting edema bilateral lower extremities.  Neurological: She is alert and oriented to person, place, and time. No cranial nerve deficit. She exhibits normal muscle tone. Coordination normal.  Skin: Skin is warm and dry.  Psychiatric: She has a normal mood and affect.  Nursing note and vitals reviewed.    ED Treatments / Results  Labs (all labs ordered are listed, but only abnormal results are displayed) Labs Reviewed  BASIC METABOLIC PANEL - Abnormal; Notable for the following components:      Result Value   BUN 34 (*)    Creatinine, Ser 1.89 (*)    GFR calc non Af Amer 22 (*)    GFR calc Af Amer 26 (*)    All other components within normal limits  CBC - Abnormal; Notable for the following components:   RBC 3.68 (*)    Hemoglobin 9.3 (*)    HCT 30.1 (*)    MCH 25.3 (*)    RDW 18.5 (*)    All other components within normal limits  BRAIN NATRIURETIC PEPTIDE  HEPATIC FUNCTION PANEL  I-STAT TROPONIN, ED    EKG  EKG Interpretation  Date/Time:  Monday June 13 2017 08:57:03 EDT Ventricular Rate:  60 PR Interval:    QRS Duration: 94 QT Interval:  478 QTC Calculation: 478 R Axis:   13 Text Interpretation:  Atrial-paced rhythm Marked ST abnormality, possible inferior subendocardial injury Abnormal ECG similar to previous, slight increase lateral/inferior ST depression Confirmed by Charlesetta Shanks [phone]) on 06/13/2017  11:37:21 AM       Radiology Dg Chest 2 View  Result  Date: 06/13/2017 CLINICAL DATA:  Shortness of breath and chest pain for 3-4 days. EXAM: CHEST - 2 VIEW COMPARISON:  05/06/2017 FINDINGS: Diffuse interstitial opacity with small pleural effusions. Chronic cardiomegaly. Stable positioning of dual-chamber pacer leads from the left. Vascular pedicle widening and aortic tortuosity. IMPRESSION: Diffuse lung opacity and small pleural effusions favoring CHF. Electronically Signed   By: Monte Fantasia M.D.   On: 06/13/2017 10:06    Procedures Procedures  CRITICAL CARE Performed by: Si Gaul   Total critical care time: 30 minutes  Critical care time was exclusive of separately billable procedures and treating other patients.  Critical care was necessary to treat or prevent imminent or life-threatening deterioration.  Critical care was time spent personally by me on the following activities: development of treatment plan with patient and/or surrogate as well as nursing, discussions with consultants, evaluation of patient's response to treatment, examination of patient, obtaining history from patient or surrogate, ordering and performing treatments and interventions, ordering and review of laboratory studies, ordering and review of radiographic studies, pulse oximetry and re-evaluation of patient's condition. Medications Ordered in ED Medications  furosemide (LASIX) injection 40 mg (not administered)     Initial Impression / Assessment and Plan / ED Course  I have reviewed the triage vital signs and the nursing notes.  Pertinent labs & imaging results that were available during my care of the patient were reviewed by me and considered in my medical decision making (see chart for details).     Final Clinical Impressions(s) / ED Diagnoses   Final diagnoses:  Acute on chronic congestive heart failure, unspecified heart failure type ([place])   With increased dyspnea over the past  several days.  Chest x-ray shows vascular congestion.  Pulmonary exam positive for rales.  EKG has some subtle ST depression.  Patient does not have active chest pain at this time.  Component normal.  Plan for admission with diuresis and continue to rule out MI. ED Discharge Orders    None       Charlesetta Shanks, MD 06/13/17 1153

## 2017-06-14 DIAGNOSIS — I5033 Acute on chronic diastolic (congestive) heart failure: Secondary | ICD-10-CM

## 2017-06-14 DIAGNOSIS — I495 Sick sinus syndrome: Secondary | ICD-10-CM

## 2017-06-14 LAB — BASIC METABOLIC PANEL
ANION GAP: 9 (ref 5–15)
BUN: 33 mg/dL — ABNORMAL HIGH (ref 6–20)
CALCIUM: 8.5 mg/dL — AB (ref 8.9–10.3)
CO2: 24 mmol/L (ref 22–32)
Chloride: 106 mmol/L (ref 101–111)
Creatinine, Ser: 1.68 mg/dL — ABNORMAL HIGH (ref 0.44–1.00)
GFR, EST AFRICAN AMERICAN: 30 mL/min — AB (ref >60)
GFR, EST NON AFRICAN AMERICAN: 26 mL/min — AB (ref >60)
GLUCOSE: 78 mg/dL (ref 65–99)
POTASSIUM: 3.9 mmol/L (ref 3.5–5.1)
SODIUM: 139 mmol/L (ref 135–145)

## 2017-06-14 LAB — CBC
HCT: 29.3 % — ABNORMAL LOW (ref 36.0–46.0)
Hemoglobin: 8.9 g/dL — ABNORMAL LOW (ref 12.0–15.0)
MCH: 24.6 pg — ABNORMAL LOW (ref 26.0–34.0)
MCHC: 30.4 g/dL (ref 30.0–36.0)
MCV: 80.9 fL (ref 78.0–100.0)
PLATELETS: 222 10*3/uL (ref 150–400)
RBC: 3.62 MIL/uL — AB (ref 3.87–5.11)
RDW: 18.2 % — AB (ref 11.5–15.5)
WBC: 5.7 10*3/uL (ref 4.0–10.5)

## 2017-06-14 LAB — PROTIME-INR
INR: 3.34
PROTHROMBIN TIME: 33.6 s — AB (ref 11.4–15.2)

## 2017-06-14 LAB — IRON AND TIBC
IRON: 40 ug/dL (ref 28–170)
Saturation Ratios: 11 % (ref 10.4–31.8)
TIBC: 363 ug/dL (ref 250–450)
UIBC: 323 ug/dL

## 2017-06-14 LAB — FOLATE: Folate: 8.7 ng/mL (ref >5.9)

## 2017-06-14 LAB — TROPONIN I: Troponin I: 0.12 ng/mL (ref <0.03)

## 2017-06-14 LAB — VITAMIN B12: VITAMIN B 12: 473 pg/mL (ref 180–914)

## 2017-06-14 LAB — RETICULOCYTES
RBC.: 3.93 MIL/uL (ref 3.87–5.11)
RETIC COUNT ABSOLUTE: 110 10*3/uL (ref 19.0–186.0)
RETIC CT PCT: 2.8 % (ref 0.4–3.1)

## 2017-06-14 LAB — FERRITIN: FERRITIN: 27 ng/mL (ref 11–307)

## 2017-06-14 MED ORDER — TRAZODONE HCL 50 MG PO TABS
50.0000 mg | ORAL_TABLET | Freq: Every day | ORAL | Status: DC
  Administered 2017-06-14 – 2017-06-19 (×7): 50 mg via ORAL
  Filled 2017-06-14 (×7): qty 1

## 2017-06-14 MED ORDER — FUROSEMIDE 10 MG/ML IJ SOLN
40.0000 mg | Freq: Once | INTRAMUSCULAR | Status: AC
  Administered 2017-06-14: 40 mg via INTRAVENOUS
  Filled 2017-06-14: qty 4

## 2017-06-14 NOTE — Evaluation (Signed)
Occupational Therapy Evaluation Patient Details Name: Natasha Fletcher MRN: [id number] DOB: 1926/02/07 Today's Date: 06/14/2017    History of Present Illness Pt is an 84 y.o. female admitted 06/13/17 with SOB; suspected fluid overload. PMH includes HFpEF, severe pulmonary HTN, sick sinus syndrome with pacemaker, a-fib (on warfarin), h/o R breast CA, DMII, HTN, CKD III, bilat TKA, home use of suprapubic catheter.   Clinical Impression   PTA, pt was independent with ADL and using RW for functional mobility. She lives alone and manages her home on her own but reports this is becoming more difficulty. She additionally cares for a dog at home. Pt currently requires hands on guarding for functional mobility and is limited by shortness of breath. She was able to ambulate approximately 50 feet in the hallway this session with RW and cues for safety. SpO2 monitor did read desaturation to 70s on RA with poor wave form and quick rebound to mid-90s when wave form corrected. Thus, question true desaturation. Pt requires overall min guard assist for LB ADL as well. Concerned for pt's ability to manage at home without assistance at this time and recommend short-term SNF placement if she is agreeable post-acute D/C. Pt is very concerned for her dog if she were to D/C to SNF. Will continue to follow while admitted.    Follow Up Recommendations  SNF;Supervision/Assistance - 24 hour(HHOT if pt not agreeable with SNF)    Equipment Recommendations  None recommended by OT    Recommendations for Other Services       Precautions / Restrictions Precautions Precautions: Fall Restrictions Weight Bearing Restrictions: No      Mobility Bed Mobility               General bed mobility comments: OOB in recliner on my arrival.  Transfers Overall transfer level: Needs assistance Equipment used: Rolling walker (2 wheeled) Transfers: Sit to/from Stand Sit to Stand: Min guard         General transfer  comment: Min guard assist for safety.     Balance Overall balance assessment: Needs assistance Sitting-balance support: No upper extremity supported;Feet supported Sitting balance-Leahy Scale: Fair     Standing balance support: Bilateral upper extremity supported Standing balance-Leahy Scale: Poor Standing balance comment: Reliant on UE support                           ADL either performed or assessed with clinical judgement   ADL Overall ADL's : Needs assistance/impaired Eating/Feeding: Set up;Sitting   Grooming: Set up;Sitting   Upper Body Bathing: Set up;Sitting   Lower Body Bathing: Sit to/from stand;Min guard   Upper Body Dressing : Set up;Sitting   Lower Body Dressing: Sit to/from stand;Min guard   Toilet Transfer: Min guard;Ambulation;RW Toilet Transfer Details (indicate cue type and reason): Fatigues very easily Toileting- Clothing Manipulation and Hygiene: Sit to/from stand;Min guard       Functional mobility during ADLs: Min guard;Rolling walker General ADL Comments: Pt reports that she would like to return home as she is concerned over her dog but she is concerned about her breathing.      Vision Patient Visual Report: No change from baseline Vision Assessment?: No apparent visual deficits     Perception     Praxis      Pertinent Vitals/Pain Pain Assessment: No/denies pain     Hand Dominance Right   Extremity/Trunk Assessment Upper Extremity Assessment Upper Extremity Assessment: Generalized weakness   Lower Extremity  Assessment Lower Extremity Assessment: Generalized weakness   Cervical / Trunk Assessment Cervical / Trunk Assessment: Kyphotic   Communication Communication Communication: HOH   Cognition Arousal/Alertness: Awake/alert Behavior During Therapy: WFL for tasks assessed/performed Overall Cognitive Status: Impaired/Different from baseline Area of Impairment: Awareness;Problem solving;Memory                      Memory: Decreased short-term memory     Awareness: Emergent Problem Solving: Slow processing;Difficulty sequencing General Comments: Pt with decreased insight into her deficits and repeated herself during session with no awareness of this.    General Comments  MD entered during session.     Exercises     Shoulder Instructions      Home Living Family/patient expects to be discharged to:: Private residence(senior living apartment) Living Arrangements: Alone Available Help at Discharge: Family;Available PRN/intermittently Type of Home: Apartment Home Access: Level entry     Home Layout: One level     Bathroom Shower/Tub: Teacher, early years/pre: Standard     Home Equipment: Environmental consultant - 2 wheels;Cane - single point;Walker - 4 wheels;Shower seat;Wheelchair - manual   Additional Comments: Brother lives nearby; reports living in a retirement community.       Prior Functioning/Environment Level of Independence: Independent with assistive device(s)  Gait / Transfers Assistance Needed: Uses RW for balance increased difficulty navigating.  ADL's / Homemaking Assistance Needed: Independent with ADL and IADL but increasing difficulty with home management tasks.             OT Problem List: Decreased strength;Decreased range of motion;Decreased activity tolerance;Impaired balance (sitting and/or standing);Decreased safety awareness;Decreased knowledge of use of DME or AE;Decreased knowledge of precautions;Pain      OT Treatment/Interventions: Self-care/ADL training;Therapeutic exercise;Energy conservation;DME and/or AE instruction;Therapeutic activities;Patient/family education;Balance training;Cognitive remediation/compensation    OT Goals(Current goals can be found in the care plan section) Acute Rehab OT Goals Patient Stated Goal: Be able to breathe better when walking OT Goal Formulation: With patient Time For Goal Achievement: 06/28/17 Potential to Achieve  Goals: Good ADL Goals Pt Will Perform Grooming: with modified independence;standing Pt Will Perform Lower Body Dressing: with modified independence;sit to/from stand Pt Will Transfer to Toilet: with modified independence;ambulating Pt Will Perform Toileting - Clothing Manipulation and hygiene: with modified independence;sit to/from stand Pt Will Perform Tub/Shower Transfer: with modified independence;ambulating;Tub transfer;shower seat;rolling walker Additional ADL Goal #1: Pt will verbalize 3 strategies to conserve energy during morning ADL routine.  OT Frequency: Min 2X/week   Barriers to D/C:            Co-evaluation              AM-PAC PT "6 Clicks" Daily Activity     Outcome Measure Help from another person eating meals?: A Little Help from another person taking care of personal grooming?: A Little Help from another person toileting, which includes using toliet, bedpan, or urinal?: A Little Help from another person bathing (including washing, rinsing, drying)?: A Little Help from another person to put on and taking off regular upper body clothing?: A Little Help from another person to put on and taking off regular lower body clothing?: A Little 6 Click Score: 18   End of Session Equipment Utilized During Treatment: Gait belt;Rolling walker Nurse Communication: Mobility status  Activity Tolerance: Patient tolerated treatment well Patient left: in chair;with call bell/phone within reach;with chair alarm set  OT Visit Diagnosis: Other abnormalities of gait and mobility (R26.89);Muscle weakness (generalized) (M62.81)  Time: 1556-1630 OT Time Calculation (min): 34 min Charges:  OT General Charges $OT Visit: 1 Visit OT Evaluation $OT Eval Moderate Complexity: 1 Mod OT Treatments $Self Care/Home Management : 8-22 mins G-Codes:     Norman Herrlich, MS OTR/L  Pager: [place] A [place] 06/14/2017, 5:02 PM

## 2017-06-14 NOTE — Progress Notes (Signed)
[facility] for Coumadin Indication: atrial fibrillation  Allergies  Allergen Reactions  . Lactose Intolerance (Gi) Other (See Comments)    Stomach pain  . Milk-Related Compounds Other (See Comments)    Upset stomach   . Sulfa Antibiotics Itching    Patient Measurements: TBW 70.8 kg  Assessment: 84 yo F presents with SOB and noted w/ HF. . On Coumadin 2mg  daily exc 4mg  on Mon PTA for Afib -INR is 3.34  Goal of Therapy:  INR 2-3 Monitor platelets by anticoagulation protocol: Yes   Plan:  Hold Coumadin today Monitor daily INR  Hildred Laser, PharmD Clinical Pharmacist Clinical phone from 8:30-4:00 is x2- After 4pm, please call Main Rx [phone]) for assistance. 06/14/2017 8:47 AM

## 2017-06-14 NOTE — Discharge Summary (Addendum)
[facility] Discharge Summary  [name] Medical record number: [id number] Date of birth: March 05, 1926 Age: 84 y.o. Gender: female Date of Admission: 06/13/2017  Date of Discharge: 06/20/17  Admitting Physician: Zenia Resides, MD  Primary Care Provider: Lorene Dy, MD Consultants: HF, Palliative  Indication for Hospitalization: SOB  Discharge Diagnoses/Problem List:  Shortness of breath Chest pain-resolved Acute on chronic kidney disease stage III Afib Acute on chronic HFpEF Pulmonary HTN Anemia T2DM Suprapubic catheter HTN OA Question cognitive decline Hx of breast cancer Abnormal speech   Disposition: SNF  Discharge Condition: Stable  Discharge Exam:  General: NAD, pleasant, sitting up in chair ENTM: Moist mucous membranes Cardiovascular: RRR, no m/r/g, no LE edema Respiratory: CTA BL, normal work of breathing Gastrointestinal: soft, nontender, nondistended MSK: moves 4 extremities equally Derm: no rashes appreciated Psych: Appropriate affect  Brief Hospital Course:  Natasha Fletcher is a 84 y.o. female presenting with SOB x 2-3days. CXR on admission showing fluid overload. BNP elevated to 553. Patient tolerated IV lasix well and had net negative 1.5L output since admission. Patient was unsure of dry weight and weight remained stable at 150lbs during admission.  Heart failure was consulted and followed patient along with patient  During admission.  Patient had AKI on admission with Cr of 1.89 but Cr improved with IV lasix to baseline of 1.4-1.6, was 1.48 piror to discharge.  Losartan was discontinued prior to arrival by the heart failure team and this was held during hospitalization.    Also she does not she patient with history of A. fib who is rate controlled at home on carvedilol.  Patient started on carvedilol 3.125 mg twice daily, but was previously on 12.5 mg twice daily.  Had to be held like and was decreased  during hospitalization due to bradycardia but this was restarted.  A MO CA test was obtained which showed patient has dementia.  Patient was started on donepezil 5 mg daily at bedtime in order to try to prolong worsening of dementia.  Prior to discharge patient's weight was stable and patient was improving and was not complaining of any shortness of breath.  Patient discharged on room air with saturations in the upper 90s.  Issues for Follow Up:  1. Consider changing diuretics - gut edema may have decreased absorption of PO lasix, started 80mg  BID.  2. MOCA of 20/30. Patient started on donepezil 5 mg daily at bedtime and Zoloft 25mg  daily. 3. 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 4. Carvedilol dosage was changed from 12.5 mg twice daily to 3.125 mg twice daily due to bradycardia. 5. Patient started on ferrous sulfate 325 mg every other day for anemia.  Significant Procedures: none  Significant Labs and Imaging:  Recent Labs  Lab 06/18/17 0554 06/19/17 0704 06/20/17 0513  WBC 4.2 3.8* 4.3  HGB 9.0* 8.8* 9.5*  HCT 29.4* 28.1* 30.5*  PLT 231 223 240   Recent Labs  Lab 06/13/17 1537  06/16/17 0629 06/17/17 0810 06/18/17 0554 06/19/17 0704 06/20/17 0513  NA  --    < > 138 136 137 137 136  K  --    < > 4.3 4.1 4.2 4.4 4.1  CL  --    < > 104 104 103 106 101  CO2  --    < > 26 22 24  21* 26  GLUCOSE  --    < >  78 86 86 82 81  BUN  --    < > 32* 34* 31* 30* 31*  CREATININE  --    < > 1.71* 1.67* 1.46* 1.38* 1.48*  CALCIUM  --    < > 8.5* 8.5* 8.6* 8.7* 8.6*  MG 2.5*  --   --   --   --   --   --    < > = values in this interval not displayed.    Ref. Range 06/13/2017 15:37 06/13/2017 20:26 06/14/2017 03:01  Troponin I Latest Ref Range: <0.03 ng/mL 0.08 (HH) 0.08 (HH) 0.12 (HH)    Ref. Range 06/14/2017 03:01  Prothrombin Time Latest Ref Range:  11.4 - 15.2 seconds 33.6 (H)  INR Unknown 3.34    Ref. Range 06/13/2017 15:37  Hemoglobin A1C Latest Ref Range: 4.8 - 5.6 % 5.1    Dg Chest 2 View  Result Date: 06/13/2017 CLINICAL DATA:  Shortness of breath and chest pain for 3-4 days. EXAM: CHEST - 2 VIEW COMPARISON:  05/06/2017 FINDINGS: Diffuse interstitial opacity with small pleural effusions. Chronic cardiomegaly. Stable positioning of dual-chamber pacer leads from the left. Vascular pedicle widening and aortic tortuosity. IMPRESSION: Diffuse lung opacity and small pleural effusions favoring CHF. Electronically Signed   By: Monte Fantasia M.D.   On: 06/13/2017 10:06   Results/Tests Pending at Time of Discharge:  Unresulted Labs (From admission, onward)   Start     Ordered   06/14/17 0500  Protime-INR  Daily,   R     06/13/17 1550     Discharge Medications:  Allergies as of 06/20/2017      Reactions   Lactose Intolerance (gi) Other (See Comments)   Stomach pain   Milk-related Compounds Other (See Comments)   Upset stomach   Sulfa Antibiotics Itching      Medication List    STOP taking these medications   HYDROcodone-acetaminophen 10-325 MG tablet Commonly known as:  NORCO Replaced by:  HYDROcodone-acetaminophen 7.5-325 MG tablet     TAKE these medications   amiodarone 200 MG tablet Commonly known as:  PACERONE Take 0.5 tablets (100 mg total) by mouth daily.   carvedilol 3.125 MG tablet Commonly known as:  COREG Take 1 tablet (3.125 mg total) by mouth 2 (two) times daily with a meal. What changed:    medication strength  how much to take  when to take this   donepezil 5 MG tablet Commonly known as:  ARICEPT Take 1 tablet (5 mg total) by mouth at bedtime.   ferrous sulfate 325 (65 FE) MG tablet Take 1 tablet (325 mg total) by mouth every other day. Start taking on:  06/21/2017   furosemide 80 MG tablet Commonly known as:  LASIX Take 1 tablet (80 mg total) by mouth 2 (two) times daily. What changed:     medication strength  how much to take   HYDROcodone-acetaminophen 7.5-325 MG tablet Commonly known as:  NORCO Take 1 tablet by mouth every 6 (six) hours as needed for moderate pain. Replaces:  HYDROcodone-acetaminophen 10-325 MG tablet Notes to patient:  Last dose given at 2 pm this afternoon    latanoprost 0.005 % ophthalmic solution Commonly known as:  XALATAN Place 1 drop into both eyes at bedtime.   Macitentan 10 MG Tabs Commonly known as:  OPSUMIT Take 1 tablet (10 mg total) by mouth daily.   polyethylene glycol packet Commonly known as:  MIRALAX / GLYCOLAX Take 17 g by mouth daily. Start taking on:  06/21/2017   sertraline 25 MG tablet Commonly known as:  ZOLOFT Take 1 tablet (25 mg total) by mouth daily. Start taking on:  06/21/2017   tadalafil (PAH) 20 MG tablet Commonly known as:  ADCIRCA Take 1 tablet (20 mg total) by mouth daily.   traZODone 50 MG tablet Commonly known as:  DESYREL Take 50 mg by mouth at bedtime.   warfarin 4 MG tablet Commonly known as:  COUMADIN Take as directed. If you are unsure how to take this medication, talk to your nurse or doctor. Original instructions:  TAKE 1/2 TO 1 TABLET BY MOUTH DAILY AS DIRECTED BY COUMADIN CLINIC What changed:  See the new instructions. Notes to patient:  See instruction above.     warfarin 4 MG tablet Commonly known as:  COUMADIN Take as directed. If you are unsure how to take this medication, talk to your nurse or doctor. Original instructions:  TAKE 1/2 TO 1 TABLET BY MOUTH DAILY AS DIRECTED BY COUMADIN CLINIC What changed:  Another medication with the same name was changed. Make sure you understand how and when to take each.       Discharge Instructions: Please refer to Patient Instructions section of EMR for full details.  Patient was counseled important signs and symptoms that should prompt return to medical care, changes in medications, dietary instructions, activity restrictions, and follow up  appointments.   Follow-Up Appointments:  Contact information for follow-up providers    Larey Dresser, MD Follow up on 07/01/2017.   Specialty:  Cardiology Why:  at 1000 am for post hospital follow up. Code for parking is [place]. Contact information: [place] [address] [phone]            Contact information for after-discharge care    Destination    [facility] .   Service:  Skilled Nursing Contact information: [place], [place], DO 06/20/2017, 2:13 PM PGY-1, [place]

## 2017-06-14 NOTE — Evaluation (Signed)
Physical Therapy Evaluation Patient Details Name: Natasha Fletcher MRN: [id number] DOB: 1925/10/24 Today's Date: 06/14/2017   History of Present Illness  Pt is an 84 y.o. female admitted 06/13/17 with SOB; suspected fluid overload. PMH includes HFpEF, severe pulmonary HTN, sick sinus syndrome with pacemaker, a-fib (on warfarin), h/o R breast CA, DMII, HTN, CKD III, bilat TKA, home use of suprapubic catheter.    Clinical Impression  Pt presents with an overall decrease in functional mobility secondary to above. PTA, pt lives alone; reports mod indep with intermittent use of RW to amb short household distances; has been very limited by SOB when amb and performing ADLs. Brother lives nearby and available for PRN assist. Today, pt able to amb 9' with RW and min guard for balance; limited by c/o SOB and fatigue. Pt would benefit from continued acute PT services to maximize functional mobility and independence prior to d/c with short-term SNF-level therapies; pt in agreement with this. Also discussed potential to consider independent living facility in the future due to increased difficulty managing alone at home.     Follow Up Recommendations SNF;Supervision for mobility/OOB    Equipment Recommendations  None recommended by PT    Recommendations for Other Services OT consult     Precautions / Restrictions Precautions Precautions: Fall Restrictions Weight Bearing Restrictions: No      Mobility  Bed Mobility Overal bed mobility: Needs Assistance Bed Mobility: Supine to Sit     Supine to sit: Supervision        Transfers Overall transfer level: Needs assistance Equipment used: Rolling walker (2 wheeled) Transfers: Sit to/from Stand Sit to Stand: Min guard            Ambulation/Gait Ambulation/Gait assistance: Min guard Ambulation Distance (Feet): 30 Feet Assistive device: Rolling walker (2 wheeled) Gait Pattern/deviations: Step-through pattern;Decreased stride length;Trunk  flexed Gait velocity: Decreased Gait velocity interpretation: <1.8 ft/sec, indicative of risk for recurrent falls General Gait Details: Slow, slightly unsteady amb with RW and min guard for balance. Intermittent cues for safe RW navigation, as pt getting wheel stuck 2x on objects. Further distance limited by SOB and fatigue  Stairs            Wheelchair Mobility    Modified Rankin (Stroke Patients Only)       Balance Overall balance assessment: Needs assistance   Sitting balance-Leahy Scale: Fair       Standing balance-Leahy Scale: Poor Standing balance comment: Reliant on UE support                             Pertinent Vitals/Pain Pain Assessment: No/denies pain    Home Living Family/patient expects to be discharged to:: Private residence Living Arrangements: Alone Available Help at Discharge: Family;Available PRN/intermittently Type of Home: Apartment Home Access: Level entry     Home Layout: One level Home Equipment: Walker - 2 wheels;Cane - single point;Walker - 4 wheels;Shower seat;Wheelchair - manual Additional Comments: Brother lives nearby and assists as needed. Moved form 7-bedroom home to 3-bedroom apartment in June 2017    Prior Function Level of Independence: Independent with assistive device(s)   Gait / Transfers Assistance Needed: Pt reports increasing difficulty ambulating at home. Uses RW for balance, but has increased difficulty navigating this for ADL tasks (i.e. making bed). Indep with tub/toilet transfers  ADL's / Homemaking Assistance Needed: Pt reports indep with ADLs, but has noticed increased difficulty performing these, especially cleaning. Enjoys preparing her own  food        Hand Dominance        Extremity/Trunk Assessment   Upper Extremity Assessment Upper Extremity Assessment: Generalized weakness    Lower Extremity Assessment Lower Extremity Assessment: Generalized weakness    Cervical / Trunk  Assessment Cervical / Trunk Assessment: Kyphotic  Communication   Communication: HOH  Cognition Arousal/Alertness: Awake/alert Behavior During Therapy: WFL for tasks assessed/performed Overall Cognitive Status: Within Functional Limits for tasks assessed                                        General Comments General comments (skin integrity, edema, etc.): Brother present at end of session    Exercises     Assessment/Plan    PT Assessment Patient needs continued PT services  PT Problem List Decreased strength;Decreased activity tolerance;Decreased balance;Decreased mobility       PT Treatment Interventions DME instruction;Gait training;Functional mobility training;Therapeutic activities;Balance training;Patient/family education;Wheelchair mobility training    PT Goals (Current goals can be found in the Care Plan section)  Acute Rehab PT Goals Patient Stated Goal: Be able to breathe better when walking PT Goal Formulation: With patient Time For Goal Achievement: 06/28/17 Potential to Achieve Goals: Fair    Frequency Min 2X/week   Barriers to discharge Decreased caregiver support      Co-evaluation               AM-PAC PT "6 Clicks" Daily Activity  Outcome Measure Difficulty turning over in bed (including adjusting bedclothes, sheets and blankets)?: A Little Difficulty moving from lying on back to sitting on the side of the bed? : A Little Difficulty sitting down on and standing up from a chair with arms (e.g., wheelchair, bedside commode, etc,.)?: A Little Help needed moving to and from a bed to chair (including a wheelchair)?: A Little Help needed walking in hospital room?: A Little Help needed climbing 3-5 steps with a railing? : A Lot 6 Click Score: 17    End of Session Equipment Utilized During Treatment: Gait belt Activity Tolerance: Patient tolerated treatment well;Patient limited by fatigue Patient left: in chair;with call bell/phone  within reach;with chair alarm set;with family/visitor present Nurse Communication: Mobility status PT Visit Diagnosis: Other abnormalities of gait and mobility (R26.89);Muscle weakness (generalized) (M62.81)    Time: 7124-5809 PT Time Calculation (min) (ACUTE ONLY): 33 min   Charges:   PT Evaluation $PT Eval Moderate Complexity: 1 Mod PT Treatments $Self Care/Home Management: 8-22   PT G Codes:       Mabeline Caras, PT, DPT Acute Rehab Services  Pager: [place] 06/14/2017, 10:21 AM

## 2017-06-14 NOTE — Plan of Care (Signed)
  Education: Knowledge of General Education information will improve 06/14/2017 0816 - Progressing by Lavonna Rua, RN 06/14/2017 0815 - Progressing by Lavonna Rua, RN 06/14/2017 0810 - Progressing by Lavonna Rua, RN 06/14/2017 [id number] - Progressing by Lavonna Rua, RN 06/14/2017 0805 - Progressing by Lavonna Rua, RN

## 2017-06-14 NOTE — Progress Notes (Signed)
Patient has been informed of message from pharmacy that she will need to have family member bring in her Macitentan tabs as this pharmacy does not stock this med.   Patient spoke with her brother via phone and he is to bring this med from home for pt. Pt advised to notify nsg when med arrives so it can be verified with pharmacy

## 2017-06-14 NOTE — Plan of Care (Signed)
  Education: Ability to demonstrate management of disease process will improve 06/14/2017 0817 - Progressing by Lavonna Rua, RN 06/14/2017 [phone] - Progressing by Lavonna Rua, RN 06/14/2017 0815 - Progressing by Lavonna Rua, RN Ability to verbalize understanding of medication therapies will improve 06/14/2017 0817 - Progressing by Lavonna Rua, RN 06/14/2017 0816 - Progressing by Lavonna Rua, RN 06/14/2017 0815 - Progressing by Lavonna Rua, RN   Education: Ability to demonstrate management of disease process will improve 06/14/2017 0817 - Progressing by Lavonna Rua, RN 06/14/2017 0816 - Progressing by Lavonna Rua, RN 06/14/2017 0815 - Progressing by Lavonna Rua, RN Ability to verbalize understanding of medication therapies will improve 06/14/2017 0817 - Progressing by Lavonna Rua, RN 06/14/2017 0816 - Progressing by Lavonna Rua, RN 06/14/2017 0815 - Progressing by Lavonna Rua, RN

## 2017-06-14 NOTE — Clinical Social Work Note (Signed)
Clinical Social Work Assessment  Patient Details  Name: SARISSA DERN MRN: [id number] Date of Birth: 1925/08/21  Date of referral:  06/14/17               Reason for consult:  Facility Placement, Discharge Planning                Permission sought to share information with:    Permission granted to share information::  No  Name::        Agency::     Relationship::     Contact Information:     Housing/Transportation Living arrangements for the past 2 months:  Apartment Source of Information:  Patient, Medical Team Patient Interpreter Needed:  None Criminal Activity/Legal Involvement Pertinent to Current Situation/Hospitalization:  No - Comment as needed Significant Relationships:  Siblings Lives with:  Pets Do you feel safe going back to the place where you live?  Yes Need for family participation in patient care:  Yes (Comment)  Care giving concerns:  PT recommending SNF once medically stable for discharge.   Social Worker assessment / plan:  CSW met with patient. No supports at bedside. CSW introduced role and explained that PT recommendations would be discussed. Patient does not want SNF placement. She stated she has been to [facility], [facility], and [facility] in the past and feels she will do better at home. Patient lives home with her miniature poodle and is eager to return home to him. Her brother is watching him right now. Patient's brother lives nearby and assists her as needed. Patient lives in a senior apartment complex called [place] [facility]. They are all first floor apartments. Patient moved there in June 2018. She also has a paramedic through the paramedicine program come in once per week for about 1-1 1/2 hours to arrange her medications and assist as needed. Patient is agreeable to home health. She has gotten services through [facility] in the past. RNCM notified. No further concerns. CSW signing off as social work intervention is no longer  needed.  Employment status:  Retired Nurse, adult PT Recommendations:  [place] / Referral to community resources:  [place]  Patient/Family's Response to care:  Patient prefers home health. Patient's brother supportive and involved in patient's care. Patient appreciated social work intervention.  Patient/Family's Understanding of and Emotional Response to Diagnosis, Current Treatment, and Prognosis:  Patient has a good understanding of the reason for admission and her need for continued therapy after discharge. Patient is happy with hospital care.  Emotional Assessment Appearance:  Appears stated age Attitude/Demeanor/Rapport:  Engaged, Gracious Affect (typically observed):  Accepting, Appropriate, Calm, Pleasant Orientation:  Oriented to Self, Oriented to Place, Oriented to  Time, Oriented to Situation Alcohol / Substance use:  Never Used Psych involvement (Current and /or in the community):  No (Comment)  Discharge Needs  Concerns to be addressed:  Care Coordination Readmission within the last 30 days:  No Current discharge risk:  Dependent with Mobility, Lives alone Barriers to Discharge:  Continued Medical Work up   Candie Chroman, LCSW 06/14/2017, 12:55 PM

## 2017-06-14 NOTE — Progress Notes (Signed)
Family Medicine Teaching Service Daily Progress Note Intern Pager: [phone]  [name] Medical record number: [id number] Date of birth: [birth year] Age: 84 y.o. Gender: female  Primary Care Provider: Lorene Dy, MD Consultants: HF Code Status: full, confirmed on admission   Pt Overview and Major Events to Date:  Admitted to [facility] on 06/13/17  Assessment and Plan: Natasha Fletcher is a 84 y.o. female presenting with shortness of breath x 2-3 days. PMH is significant for HFpEF (last echo 02/2017 hyperdynamic LV function, severe LVH, g3DD, moderate RV dilation, severe TR, severe pulm HTN), anemia (baseline Hgb 9-10), sick sinus syndrome with pacemaker in place, afib on warfarin, hx of R breast cancer, T2DM (last A1c unknown), HTN, CKD 3.   Shortness of breath Patient reports worsened SOB x 2-3 days at home, worsened with activity. Suspect fluid overload given CXR and recent increase in lasix prior to arrival (from 40 BID to 60mg  BID). Question role of gut edema in decreased absorption of PO lasix. BNP 553 on admission. Patient states weight up at home x 1 day, she is unsure of dry weight. Net negative output 1L overnight. Wt decreased 1lb to 149lb from 150lb on 3/11.  -Strict I's and O's -Daily weights -IV Lasix 40mg   -HF consulted, appreciated recommendations  -PT/OT - recommending SNF -ambulatory pulse ox   Chest pain-resolved  Patient without known CAD, risk factors include HTN, DM, african [place]. HEART score 4. History not classical (intermittent, not associated with exertion, no longer present). Troponins trended at 0.08>0.08>0.12 -am EKG   AKI on CKDIII Cr improved to 1.68, baseline (Cr 1.4-1.6). Losartan discontinued prior to arrival by HF team. -Daily BMP -hold ARB -avoid nephrotoxic agents  Afib Home warfarin managed by Dr. Claiborne Billings (cards). Atrial pacer in place. Hx of sick sinus syndrome. Home meds are amiodarone, coreg. -continue amiodarone -hold  coreg for bradycardia -continue coumadin per pharm  Acute on chronic HFpEF Cards conducting outpatient workup for restrictive disease given severe LVH.  PET scan 05/18/2017 suggestive of amyloidosis. -Diuresis as above -hold home coreg 2/2 bradycardia  Pulm HTN Severe on most recent echo. Patient on opsumit and adcirca at home per cards. These were started ~05/23/17, question whether opsumit can cause some fluid retention. VQ scan 05/2017 to rule out chronic PE as a cause of pulm HTN.  -continue adcirca and opsumit -appreciate HF recs  -would benefit from home O2   Anemia Slightly decreased from 9.3 to 8.9. Baseline appears to be 9-10. Suspect ACD, although no iron studies in our system (PCP doesn't appear to be in epic).  -daily CBC  -anemia panel   T2DM On problem list per cards. PCP not in epic. A1C 5.1  Suprapubic catheter Patient reports this is due to urinating too much.  Unclear reason for placement as her urologist is not in epic. -RN has attached catheter to Foley bag -Would need to replace catheter if concern for UTI, no concern for this at present  HTN Previously on losartan, but held 05/19/17.  -hold home coreg 2/2 bradycardia  OA Hx of R TKR in 2015. Norco on home med list.  -CPAP PRN  Question cognitive decline Patient seems to have limited understanding of her disease state.  Repeatedly asks if heart failure is "serious."  No dementia meds on problem list.  Does live alone. -Will consult SLP for [facility]  Hx of breast cancer Patient reports hx of biopsy without treatment and yearly follow up.   Abnormal speech Patient stutters,  per chart review appears to be chronic.  -no new neurological workup  FEN/GI: heart healthy diet Prophylaxis: home warfarin  Disposition: would benefit from SNF placement   Subjective:  Patient states she feels ok. States she is unsure of breathing and wondering if she would benefit from supplemental O2. Patient denies  chest pain. Patient stated she had nose bleed a few days ago and is wondering if it is safe to take coumadin. States she blew her nose and saw blood in kleenex. Informed patient that since bleeding was only seen in kleenex for short period of time she may continue on coumadin.   Objective: Temp:  [98.4 F (36.9 C)-98.6 F (37 C)] 98.6 F (37 C) (03/12 1302) Pulse Rate:  [58-97] 61 (03/12 1302) Resp:  [14-24] 14 (03/12 1302) BP: (110-165)/(45-90) 150/54 (03/12 1302) SpO2:  [86 %-100 %] 100 % (03/12 1302) Weight:  [149 lb 11.1 oz (67.9 kg)-150 lb 12.7 oz (68.4 kg)] 149 lb 11.1 oz (67.9 kg) (03/12 0536) Physical Exam: General: awake and alert, sitting up in bed, NAD Cardiovascular: RRR, no MRG Respiratory: crackles most prominent at bases bilaterally, apices clear. No increased work of breathing  Abdomen: soft, non tender, non distended, bowel sounds normal  Extremities: no edema, non tender   Laboratory: Recent Labs  Lab 06/13/17 0915 06/14/17 0301  WBC 4.8 5.7  HGB 9.3* 8.9*  HCT 30.1* 29.3*  PLT 222 222   Recent Labs  Lab 06/13/17 0915 06/13/17 1144 06/14/17 0301  NA 136  --  139  K 4.0  --  3.9  CL 105  --  106  CO2 23  --  24  BUN 34*  --  33*  CREATININE 1.89*  --  1.68*  CALCIUM 8.9  --  8.5*  PROT  --  7.5  --   BILITOT  --  1.2  --   ALKPHOS  --  88  --   ALT  --  13*  --   AST  --  24  --   GLUCOSE 98  --  78    Ref. Range 06/13/2017 15:37 06/13/2017 20:26 06/14/2017 03:01  Troponin I Latest Ref Range: <0.03 ng/mL 0.08 (HH) 0.08 (HH) 0.12 (HH)    Ref. Range 06/14/2017 03:01  Prothrombin Time Latest Ref Range: 11.4 - 15.2 seconds 33.6 (H)  INR Unknown 3.34    Ref. Range 06/13/2017 15:37  Hemoglobin A1C Latest Ref Range: 4.8 - 5.6 % 5.1   Imaging/Diagnostic Tests: Dg Chest 2 View  Result Date: 06/13/2017 CLINICAL DATA:  Shortness of breath and chest pain for 3-4 days. EXAM: CHEST - 2 VIEW COMPARISON:  05/06/2017 FINDINGS: Diffuse interstitial opacity with  small pleural effusions. Chronic cardiomegaly. Stable positioning of dual-chamber pacer leads from the left. Vascular pedicle widening and aortic tortuosity. IMPRESSION: Diffuse lung opacity and small pleural effusions favoring CHF. Electronically Signed   By: Monte Fantasia M.D.   On: 06/13/2017 10:06   Nm Tumor Localization W Spect  Result Date: 05/18/2017 CLINICAL DATA:  HEART FAILURE. CONCERN FOR CARDIAC AMYLOIDOSIS. EXAM: NUCLEAR MEDICINE TUMOR LOCALIZATION. PYP CARDIAC AMYLOIDOSIS SCAN WITH SPECT TECHNIQUE: Following intravenous administration of radiopharmaceutical, anterior planar images of the chest were obtained. Regions of interest were placed on the heart and contralateral chest wall for quantitative assessment. Additional SPECT imaging of the chest was obtained. RADIOPHARMACEUTICALS:  20.2 mCi TECHNETIUM 99 PYROPHOSPHATE FINDINGS: Planar Visual assessment: Anterior planar imaging demonstrates radiotracer uptake within the heart greater than uptake within the adjacent ribs (  Grade 3). Quantitative assessment : Quantitative assessment of the cardiac uptake compared to the contralateral chest wall is equal to 1.36 . SPECT assessment: SPECT imaging of the chest demonstrates clear radiotracer accumulation within the LEFT ventricle. IMPRESSION: Visual and quantitative assessment (grade 3, H/CLL equal 1.36) are strongly suggestive of transthyretin amyloidosis. Electronically Signed   By: Kerby Moors M.D.   On: 05/18/2017 13:57     Caroline More, DO 06/14/2017, 2:20 PM PGY-1, [place] Intern pager: [phone], text pages welcome

## 2017-06-15 ENCOUNTER — Other Ambulatory Visit: Payer: Self-pay

## 2017-06-15 DIAGNOSIS — I509 Heart failure, unspecified: Secondary | ICD-10-CM

## 2017-06-15 DIAGNOSIS — E854 Organ-limited amyloidosis: Secondary | ICD-10-CM

## 2017-06-15 DIAGNOSIS — I43 Cardiomyopathy in diseases classified elsewhere: Secondary | ICD-10-CM

## 2017-06-15 LAB — BASIC METABOLIC PANEL
ANION GAP: 10 (ref 5–15)
BUN: 28 mg/dL — ABNORMAL HIGH (ref 6–20)
CALCIUM: 8.7 mg/dL — AB (ref 8.9–10.3)
CO2: 24 mmol/L (ref 22–32)
CREATININE: 1.53 mg/dL — AB (ref 0.44–1.00)
Chloride: 104 mmol/L (ref 101–111)
GFR calc non Af Amer: 29 mL/min — ABNORMAL LOW (ref >60)
GFR, EST AFRICAN AMERICAN: 34 mL/min — AB (ref >60)
Glucose, Bld: 83 mg/dL (ref 65–99)
Potassium: 4 mmol/L (ref 3.5–5.1)
SODIUM: 138 mmol/L (ref 135–145)

## 2017-06-15 LAB — CBC
HCT: 30.4 % — ABNORMAL LOW (ref 36.0–46.0)
Hemoglobin: 9.4 g/dL — ABNORMAL LOW (ref 12.0–15.0)
MCH: 25.4 pg — ABNORMAL LOW (ref 26.0–34.0)
MCHC: 30.9 g/dL (ref 30.0–36.0)
MCV: 82.2 fL (ref 78.0–100.0)
Platelets: 225 10*3/uL (ref 150–400)
RBC: 3.7 MIL/uL — ABNORMAL LOW (ref 3.87–5.11)
RDW: 18.4 % — AB (ref 11.5–15.5)
WBC: 5.2 10*3/uL (ref 4.0–10.5)

## 2017-06-15 LAB — PROTIME-INR
INR: 2.7
PROTHROMBIN TIME: 28.5 s — AB (ref 11.4–15.2)

## 2017-06-15 MED ORDER — FUROSEMIDE 80 MG PO TABS
80.0000 mg | ORAL_TABLET | Freq: Every day | ORAL | Status: DC
  Administered 2017-06-15: 80 mg via ORAL
  Filled 2017-06-15: qty 1

## 2017-06-15 MED ORDER — FERROUS SULFATE 325 (65 FE) MG PO TABS
325.0000 mg | ORAL_TABLET | ORAL | Status: DC
  Administered 2017-06-15 – 2017-06-19 (×3): 325 mg via ORAL
  Filled 2017-06-15 (×4): qty 1

## 2017-06-15 MED ORDER — WARFARIN SODIUM 2 MG PO TABS
2.0000 mg | ORAL_TABLET | Freq: Once | ORAL | Status: AC
  Administered 2017-06-15: 2 mg via ORAL
  Filled 2017-06-15: qty 1

## 2017-06-15 NOTE — Progress Notes (Signed)
Pt states her pain is 9/10 in her back. Pt is not due for norco until 1423. Pt refuses tylenol. RN repositioned pt and applied warm compresses to pt's back. MD notified.

## 2017-06-15 NOTE — Evaluation (Signed)
Speech Language Pathology Evaluation Patient Details Name: Natasha Fletcher MRN: [id number] DOB: Mar 06, 1926 Today's Date: 06/15/2017 Time: 6269-4854 SLP Time Calculation (min) (ACUTE ONLY): 33 min  Problem List:  Patient Active Problem List   Diagnosis Date Noted  . Acute on chronic diastolic CHF (congestive heart failure) ([place])   . Sick sinus syndrome due to SA node dysfunction (H[facility])   . Shortness of breath 06/13/2017  . Atrial fibrillation ([place]) 03/22/2016  . Bradycardia 08/22/2015  . Junctional (nodal) bradycardia 08/22/2015  . Malnutrition of moderate degree 03/19/2015  . Benign essential HTN   . Bradycardia, drug induced 03/18/2015  . Hypokalemia 02/16/2015  . Thrombocytopenia ([place]) 02/16/2015  . Sepsis due to urinary tract infection ([place])   . Sepsis ([place]) 02/12/2015  . Urinary tract infectious disease   . Long-term (current) use of anticoagulants 07/12/2014  . Chronic anticoagulation 02/11/2014  . Hypertensive cardiovascular disease 01/24/2014  . Chronic diastolic CHF (congestive heart failure) ([place]) 01/24/2014  . Anemia 01/24/2014  . Breast cancer of upper-inner quadrant of right female breast ([place]) 01/24/2014  . Acute diastolic heart failure ([place]) 11/11/2013  . Atrial flutter ([place]) 11/11/2013  . PAF (paroxysmal atrial fibrillation) ([place]) 11/10/2013  . Slurred speech 11/10/2013  . Protein-calorie malnutrition, severe ([place]) 11/06/2013  . Coffee ground emesis 11/05/2013  . Dehydration 10/19/2013  . Urinary retention 10/18/2013  . GERD (gastroesophageal reflux disease) 03/20/2013  . S/P total knee arthroplasty   . UTI (urinary tract infection) 02/21/2013  . OA (osteoarthritis) of knee 02/19/2013  . Hypertension   . Thyroid disease   . Cancer of central portion of female breast ([place]) 04/14/2011   Past Medical History:  Past Medical History:  Diagnosis Date  . Arthritis    "all over"  . Asthma   . Atrial fibrillation ([place])    a. s/p DCCV in 01/2014 b. repeat DCCV  in 06/2015, started on Amiodarone at that time. On Coumadin for anticoagulation.  . Cancer ([place])    rt breast  . Chronic diastolic CHF (congestive heart failure) ([place])    a. 08/2015: Echo with EF of 60-65%, no WMA, LA mod dilated, mild TR.  . Diabetes mellitus without complication ([place])   . Edema leg   . Fast breathing    "because of pill"  . GERD (gastroesophageal reflux disease)   . Glaucoma   . Glaucoma   . Hypertension   . Incontinence   . Pneumonia    h/o younger  . S/P total knee arthroplasty   . SSS (sick sinus syndrome) ([place])    a. s/p Medtronic Advisa DR MRI [id number] (serial number [id number]) PPM placement in 08/2015  . Swelling    bilateral feet/ legs, more left.  . Thyroid disease    "something years ago"  . UTI (urinary tract infection) 02/12/2015  . Wears dentures   . Wears glasses    Past Surgical History:  Past Surgical History:  Procedure Laterality Date  . ABDOMINAL HYSTERECTOMY    . APPENDECTOMY    . BREAST BIOPSY  04/27/2011   Procedure: BREAST BIOPSY WITH NEEDLE LOCALIZATION;  Surgeon: Edward Jolly, MD;  Location: [place];  Service: General;  Laterality: Right;  right needle localized breast lumpectomy   . BREAST LUMPECTOMY  04/27/11   right breast by [place]  . CARDIOVERSION N/A 02/01/2014   Procedure: CARDIOVERSION;  Surgeon: Candee Furbish, MD;  Location[facility] ENDOSCOPY;  Service: Cardiovascular;  Laterality: N/A;  . CARDIOVERSION N/A 06/04/2015   Procedure:  CARDIOVERSION;  Surgeon: Dorothy Spark, MD;  Location: [place];  Service: Cardiovascular;  Laterality: N/A;  . CARDIOVERSION N/A 07/14/2015   Procedure: CARDIOVERSION;  Surgeon: Thayer Headings, MD;  Location[facility] ENDOSCOPY;  Service: Cardiovascular;  Laterality: N/A;  . CARPAL TUNNEL RELEASE Bilateral   . COLONOSCOPY    . EP IMPLANTABLE DEVICE N/A 08/22/2015   Procedure: Pacemaker Implant;  Surgeon: Will Meredith Leeds, MD;  Medtronic Advisa DR MRI [phone] [id number] (serial  number [id number]);  Laterality: N/A;  . EYE SURGERY     both cataracts  . RIGHT HEART CATH N/A 04/21/2017   Procedure: RIGHT HEART CATH;  Surgeon: Larey Dresser, MD;  Location: [facility] CV LAB;  Service: Cardiovascular;  Laterality: N/A;  . SHOULDER SURGERY Left   . TEE WITHOUT CARDIOVERSION N/A 02/01/2014   Procedure: TRANSESOPHAGEAL ECHOCARDIOGRAM (TEE);  Surgeon: Candee Furbish, MD;  Location[facility] ENDOSCOPY;  Service: Cardiovascular;  Laterality: N/A;  . TONSILLECTOMY    . TOTAL HIP ARTHROPLASTY Bilateral   . TOTAL KNEE ARTHROPLASTY Right 02/19/2013   Procedure: RIGHT TOTAL KNEE ARTHROPLASTY;  Surgeon: Gearlean Alf, MD;  Location: [facility] ORS;  Service: Orthopedics;  Laterality: Right;  . TOTAL KNEE ARTHROPLASTY Left 10/15/2013   Procedure: LEFT TOTAL KNEE ARTHROPLASTY;  Surgeon: Gearlean Alf, MD;  Location: [facility] ORS;  Service: Orthopedics;  Laterality: Left;   HPI:  Pt is an 84 y.o. female admitted 06/13/17 with SOB; suspected fluid overload. PMH includes HFpEF, severe pulmonary HTN, sick sinus syndrome with pacemaker, a-fib (on warfarin), h/o R breast CA, DMII, HTN, CKD III, bilat TKA, home use of suprapubic catheter.   Assessment / Plan / Recommendation Clinical Impression   Pt presents with mild-moderate cognitive deficits primarily related to storage and retrieval of information which impacts her executive functioning abilities, specifically task organization, self monitoring, and self correcting.   Findings are supported by a score of 20/30 on the [employer] standardized cognitive assessment.  Suspect these deficits are chronic in nature given evidence of small vessel ischemia evident on multiple CT scans and MRIs dating back to 2008.  Pt endorses worsening mentation over the past several months, but most noticeably over the last 3 days.  Suspect chronic deficits are currently exacerbated in the setting of acute illness.  ST will follow up briefly while inpatient to address education and  compensatory strategy use.  Would recommend 24/7 supervision at discharge and if pt does not have recommended level of assist, SNF may be warranted.    SLP Assessment  SLP Recommendation/Assessment: Patient needs continued Speech Lanaguage Pathology Services SLP Visit Diagnosis: Cognitive communication deficit (R41.841)    Follow Up Recommendations  24 hour supervision/assistance;Skilled Nursing facility    Frequency and Duration min 1 x/week         SLP Evaluation Cognition  Overall Cognitive Status: History of cognitive impairments - at baseline(suspected) Arousal/Alertness: Awake/alert Orientation Level: Oriented X4 Attention: Selective Selective Attention: Appears intact Memory: Impaired Memory Impairment: Retrieval deficit;Storage deficit;Decreased recall of new information Awareness: Appears intact Problem Solving: Impaired Problem Solving Impairment: Functional complex Executive Function: Self Monitoring;Self Correcting;Organizing Organizing: Impaired Organizing Impairment: Functional complex Self Monitoring: Impaired Self Monitoring Impairment: Functional complex Self Correcting: Impaired Self Correcting Impairment: Functional complex Safety/Judgment: Appears intact       Comprehension  Auditory Comprehension Overall Auditory Comprehension: Appears within functional limits for tasks assessed    Expression Expression Primary Mode of Expression: Verbal Verbal Expression Overall Verbal Expression: Appears within functional limits for tasks assessed  Oral / Motor  Oral Motor/Sensory Function Overall Oral Motor/Sensory Function: Within functional limits Motor Speech Overall Motor Speech: Appears within functional limits for tasks assessed   GO                    PageSelinda Orion 06/15/2017, 9:48 AM

## 2017-06-15 NOTE — Plan of Care (Signed)
  Clinical Measurements: Cardiovascular complication will be avoided 06/15/2017 1938 - Progressing by Madoline Bhatt A, RN   Clinical Measurements: Ability to maintain clinical measurements within normal limits will improve 06/15/2017 1940 - Progressing by Jesselee Poth, Roma Kayser, RN

## 2017-06-15 NOTE — Consult Note (Addendum)
Advanced Heart Failure Team Consult Note   Primary Physician: Lorene Dy, MD PCP-Cardiologist:  Shelva Majestic, MD  Reason for Consultation: Acute on chronic diastolic CHF  HPI:    Natasha Fletcher is seen today for evaluation of Acute on chronic diastolic CHF at the request of Dr. Andria Frames.   Natasha Fletcher is a 84 y.o. female  with CKD Stage 3, SSS with medtronic PPM, PAF, Chronic diastolic CHF, and pulmonary hypertension.   Echo 02/22/17 with LVEF 65-70%, hyperdynamic, Grade 3 DD, Mild AI, Mod RV dilation and reduced RV function, severe TR, Peak PA pressure 79 mm.   Last seen in HF clinic 05/09/17. Had started Opsumit 3 days prior. Remained SOB with activity around the house and had occasional lightheadedness. Losartan stopped and coreg decreased with orthostasis. Serological studies sent and PYP scan ordered.   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  Genetic testing planned.   Pt has had worsening SOB at home for 2-3 days. Seem by paramedicine and we increased her lasix at home in setting of this, weight gain, and edema. Pt uses a suprapubic catheter at home with foley bag and noted decreased urine output. Of note, she was started on Opsumit 05/06/17 as above.  She [facility] to [facility]13[facility]17 with symptoms above, along with central chest discomfort (non-exertional). Pertinent labs on admission include Cr 1.89, K 4.0, BNP 553, Troponin mildly elevated but flat, and WBC 4.8. CXR with diffuse lung opacity and small pleural effusions favoring CHF.   She has been diuresed so far on IV lasix 40 mg BID, transitioned to po diuretics for today (80 daily, from 40 BID PTA).  Weigh unchanged from admit. She feels better. Concerned over her multiple medical problems and asks "how long do I have?"  She is very realistic about her problems and therapies.  She asks if we think SNF will be helpful for her.  At baseline, she has SOB with minimal activity. She has "tingling" in her toes and  fingers at times, though states this has only been for the past several weeks. She has some lightheadedness with rapid standing.   RHC 04/21/17  Hemodynamics (mmHg) RA mean 8 RV 68/10 PA 72/22, mean 41 PCWP mean 20  Oxygen saturations: PA 55% AO 99%  Cardiac Output (Fick) 3.87  Cardiac Index (Fick) 2.22 PVR 5.4 WU  Review of Systems: [y] = yes, [ ]  = no   General: Weight gain [y]; Weight loss [ ] ; Anorexia [ ] ; Fatigue [y]; Fever [ ] ; Chills [ ] ; Weakness [y]  Cardiac: Chest pain/pressure [ ] ; Resting SOB [ ] ; Exertional SOB [y]; Orthopnea [y]; Pedal Edema [y]; Palpitations [ ] ; Syncope [ ] ; Presyncope [ ] ; Paroxysmal nocturnal dyspnea[ ]   Pulmonary: Cough [ ] ; Wheezing[ ] ; Hemoptysis[ ] ; Sputum [ ] ; Snoring [ ]   GI: Vomiting[ ] ; Dysphagia[ ] ; Melena[ ] ; Hematochezia [ ] ; Heartburn[ ] ; Abdominal pain [ ] ; Constipation [ ] ; Diarrhea [ ] ; BRBPR [ ]   GU: Hematuria[ ] ; Dysuria [ ] ; Nocturia[ ]   Vascular: Pain in legs with walking [ ] ; Pain in feet with lying flat [ ] ; Non-healing sores [ ] ; Stroke [ ] ; TIA [ ] ; Slurred speech [ ] ;  Neuro: Headaches[ ] ; Vertigo[ ] ; Seizures[ ] ; Paresthesias[ ] ;Blurred vision [ ] ; Diplopia [ ] ; Vision changes [ ]   Ortho/Skin: Arthritis [y]; Joint pain [y]; Muscle pain [ ] ; Joint swelling [ ] ; Back Pain [ ] ; Rash [ ]   Psych: Depression[ ] ; Anxiety[ ]   Heme: Bleeding problems [ ] ; Clotting disorders [ ] ; Anemia [ ]   Endocrine: Diabetes [ ] ; Thyroid dysfunction[ ]   Home Medications Prior to Admission medications   Medication Sig Start Date End Date Taking? Authorizing Provider  amiodarone (PACERONE) 200 MG tablet Take 0.5 tablets (100 mg total) by mouth daily. 03/01/17  Yes Camnitz, Will Hassell Done, MD  carvedilol (COREG) 12.5 MG tablet Take 1 tablet (12.5 mg total) by mouth 2 (two) times daily. 05/09/17  Yes Larey Dresser, MD  furosemide (LASIX) 20 MG tablet Take 2 tablets (40 mg total) by mouth 2 (two) times daily. 05/10/17  Yes Larey Dresser, MD    HYDROcodone-acetaminophen [facility]) 10-325 MG tablet Take 1 tablet by mouth 3 (three) times daily as needed (for pain.).  11/03/15  Yes [provider]  latanoprost (XALATAN) 0.005 % ophthalmic solution Place 1 drop into both eyes at bedtime.   Yes [provider]  losartan (COZAAR) 25 MG tablet Take 25 mg by mouth daily. 05/24/17  Yes [provider]  Macitentan (OPSUMIT) 10 MG TABS Take 1 tablet (10 mg total) by mouth daily. 05/04/17  Yes Larey Dresser, MD  tadalafil, PAH, (ADCIRCA) 20 MG tablet Take 1 tablet (20 mg total) by mouth daily. 05/26/17  Yes Larey Dresser, MD  traZODone (DESYREL) 50 MG tablet Take 50 mg by mouth at bedtime. 06/07/17  Yes [provider]  warfarin (COUMADIN) 4 MG tablet TAKE 1/2 TO 1 TABLET BY MOUTH DAILY AS DIRECTED BY COUMADIN CLINIC 06/09/17  Yes Troy Sine, MD  warfarin (COUMADIN) 4 MG tablet TAKE 1/2 TO 1 TABLET BY MOUTH DAILY AS DIRECTED BY COUMADIN CLINIC Patient taking differently: TAKE 0.5 TABLET (2 MG) BY MOUTH EVERY EVENING, EXCEPT TAKE 1 TABLET (4 MG) BY MOUTH ON MONDAYS DAILY IN THE EVENING AS DIRECTED BY COUMADIN CLINIC 12/14/16   Troy Sine, MD    Past Medical History: Past Medical History:  Diagnosis Date  . Arthritis    "all over"  . Asthma   . Atrial fibrillation ([place])    a. s/p DCCV in 01/2014 b. repeat DCCV in 06/2015, started on Amiodarone at that time. On Coumadin for anticoagulation.  . Cancer ([place])    rt breast  . Chronic diastolic CHF (congestive heart failure) ([place])    a. 08/2015: Echo with EF of 60-65%, no WMA, LA mod dilated, mild TR.  . Diabetes mellitus without complication ([place])   . Edema leg   . Fast breathing    "because of pill"  . GERD (gastroesophageal reflux disease)   . Glaucoma   . Glaucoma   . Hypertension   . Incontinence   . Pneumonia    h/o younger  . S/P total knee arthroplasty   . SSS (sick sinus syndrome) ([place])    a. s/p Medtronic Advisa DR MRI [id number] (serial  number [id number]) PPM placement in 08/2015  . Swelling    bilateral feet/ legs, more left.  . Thyroid disease    "something years ago"  . UTI (urinary tract infection) 02/12/2015  . Wears dentures   . Wears glasses     Past Surgical History: Past Surgical History:  Procedure Laterality Date  . ABDOMINAL HYSTERECTOMY    . APPENDECTOMY    . BREAST BIOPSY  04/27/2011   Procedure: BREAST BIOPSY WITH NEEDLE LOCALIZATION;  Surgeon: Edward Jolly, MD;  Location: [place];  Service: General;  Laterality: Right;  right needle localized breast lumpectomy   .  BREAST LUMPECTOMY  04/27/11   right breast by [place]  . CARDIOVERSION N/A 02/01/2014   Procedure: CARDIOVERSION;  Surgeon: Candee Furbish, MD;  Location: [facility] ENDOSCOPY;  Service: Cardiovascular;  Laterality: N/A;  . CARDIOVERSION N/A 06/04/2015   Procedure: CARDIOVERSION;  Surgeon: Dorothy Spark, MD;  Location: [place];  Service: Cardiovascular;  Laterality: N/A;  . CARDIOVERSION N/A 07/14/2015   Procedure: CARDIOVERSION;  Surgeon: Thayer Headings, MD;  Location: [facility] ENDOSCOPY;  Service: Cardiovascular;  Laterality: N/A;  . CARPAL TUNNEL RELEASE Bilateral   . COLONOSCOPY    . EP IMPLANTABLE DEVICE N/A 08/22/2015   Procedure: Pacemaker Implant;  Surgeon: Will Meredith Leeds, MD;  Medtronic Advisa DR MRI [id number] (serial number [id number]);  Laterality: N/A;  . EYE SURGERY     both cataracts  . RIGHT HEART CATH N/A 04/21/2017   Procedure: RIGHT HEART CATH;  Surgeon: Larey Dresser, MD;  Location: [place] CV LAB;  Service: Cardiovascular;  Laterality: N/A;  . SHOULDER SURGERY Left   . TEE WITHOUT CARDIOVERSION N/A 02/01/2014   Procedure: TRANSESOPHAGEAL ECHOCARDIOGRAM (TEE);  Surgeon: Candee Furbish, MD;  Location: [facility] ENDOSCOPY;  Service: Cardiovascular;  Laterality: N/A;  . TONSILLECTOMY    . TOTAL HIP ARTHROPLASTY Bilateral   . TOTAL KNEE ARTHROPLASTY Right 02/19/2013   Procedure: RIGHT TOTAL KNEE  ARTHROPLASTY;  Surgeon: Gearlean Alf, MD;  Location: [facility] ORS;  Service: Orthopedics;  Laterality: Right;  . TOTAL KNEE ARTHROPLASTY Left 10/15/2013   Procedure: LEFT TOTAL KNEE ARTHROPLASTY;  Surgeon: Gearlean Alf, MD;  Location: [facility] ORS;  Service: Orthopedics;  Laterality: Left;    Family History: Family History  Problem Relation Age of Onset  . Heart disease Mother   . Stroke Mother   . Hypertension Mother   . Stroke Father   . Hypertension Father   . Stroke Brother   . Heart attack Neg Hx     Social History: Social History   Socioeconomic History  . Marital status: Single    Spouse name: None  . Number of children: None  . Years of education: None  . Highest education level: None  Social Needs  . Financial resource strain: None  . Food insecurity - worry: None  . Food insecurity - inability: None  . Transportation needs - medical: None  . Transportation needs - non-medical: None  Occupational History  . None  Tobacco Use  . Smoking status: Former Smoker    Packs/day: 0.00    Years: 30.00    Pack years: 0.00    Types: Cigarettes    Last attempt to quit: 04/21/1975    Years since quitting: 42.1  . Smokeless tobacco: Never Used  Substance and Sexual Activity  . Alcohol use: Yes    Comment: occasionally  . Drug use: No  . Sexual activity: Not Currently  Other Topics Concern  . None  Social History Narrative   From SNF, getting PT and using cane.      Allergies:  Allergies  Allergen Reactions  . Lactose Intolerance (Gi) Other (See Comments)    Stomach pain  . Milk-Related Compounds Other (See Comments)    Upset stomach   . Sulfa Antibiotics Itching    Objective:    Vital Signs:   Temp:  [97.7 F (36.5 C)-98.7 F (37.1 C)] 98.7 F (37.1 C) (03/13 0800) Pulse Rate:  [60-64] 62 (03/13 0931) Resp:  [14-18] 15 (03/13 0800) BP: (90-159)/(54-73) 93/54 (03/13 0931) SpO2:  [95 %-100 %] 96 % (  03/13 0800) Weight:  [150 lb 4.8 oz (68.2 kg)-150 lb 5.7 oz  (68.2 kg)] 150 lb 5.7 oz (68.2 kg) (03/13 0800) Last BM Date: 06/10/17  Weight change: Filed Weights   06/14/17 0536 06/15/17 0453 06/15/17 0800  Weight: 149 lb 11.1 oz (67.9 kg) 150 lb 4.8 oz (68.2 kg) 150 lb 5.7 oz (68.2 kg)    Intake/Output:   Intake/Output Summary (Last 24 hours) at 06/15/2017 1414 Last data filed at 06/15/2017 1130 Gross per 24 hour  Intake 480 ml  Output 530 ml  Net -50 ml      Physical Exam    General:  Elderly appearing. NAD.  HEENT: normal Neck: supple. JVP 7-8 cm. Carotids 2+ bilat; no bruits. No lymphadenopathy or thyromegaly appreciated. Cor: PMI nondisplaced. Regular rate & rhythm. 3/6 HSM LLSB.  Lungs: Diminished basilar sounds with slight crackles.  Abdomen: Soft, nontender, nondistended. No hepatosplenomegaly. No bruits or masses. Good bowel sounds. Extremities: no cyanosis, clubbing, rash, or edema. Neuro: alert & orientedx3, cranial nerves grossly intact. moves all 4 extremities w/o difficulty. Affect very pleasant   Telemetry   Atrial paced 60s, personally reviewed.  EKG    Atrial paced 60 bpm, personally reviewed.   Labs   Basic Metabolic Panel: Recent Labs  Lab 06/13/17 0915 06/13/17 1537 06/14/17 0301 06/15/17 0507  NA 136  --  139 138  K 4.0  --  3.9 4.0  CL 105  --  106 104  CO2 23  --  24 24  GLUCOSE 98  --  78 83  BUN 34*  --  33* 28*  CREATININE 1.89*  --  1.68* 1.53*  CALCIUM 8.9  --  8.5* 8.7*  MG  --  2.5*  --   --     Liver Function Tests: Recent Labs  Lab 06/13/17 1144  AST 24  ALT 13*  ALKPHOS 88  BILITOT 1.2  PROT 7.5  ALBUMIN 3.6   No results for input(s): LIPASE, AMYLASE in the last 168 hours. No results for input(s): AMMONIA in the last 168 hours.  CBC: Recent Labs  Lab 06/13/17 0915 06/14/17 0301 06/15/17 0507  WBC 4.8 5.7 5.2  HGB 9.3* 8.9* 9.4*  HCT 30.1* 29.3* 30.4*  MCV 81.8 80.9 82.2  PLT 222 222 225    Cardiac Enzymes: Recent Labs  Lab 06/13/17 1537 06/13/17 2026  06/14/17 0301  TROPONINI 0.08* 0.08* 0.12*    BNP: BNP (last 3 results) Recent Labs    06/13/17 0915  BNP 553.0*    ProBNP (last 3 results) Recent Labs    01/17/17 1438  PROBNP 4,311*     CBG: No results for input(s): GLUCAP in the last 168 hours.  Coagulation Studies: Recent Labs    06/13/17 1537 06/14/17 0301 06/15/17 0507  LABPROT 32.9* 33.6* 28.5*  INR 3.26 3.34 2.70     Imaging    No results found.   Medications:     Current Medications: . amiodarone  100 mg Oral Daily  . latanoprost  1 drop Both Eyes QHS  . Macitentan  10 mg Oral Daily  . sodium chloride flush  3 mL Intravenous Q12H  . tadalafil (PAH)  20 mg Oral Daily  . traZODone  50 mg Oral QHS  . warfarin  2 mg Oral ONCE-1800  . Warfarin - Pharmacist Dosing Inpatient   Does not apply q1800     Infusions:   Patient Profile   Natasha Fletcher is a 84 y.o. female with  CKD Stage 3, SSS with medtronic PPM, PAF, Chronic diastolic CHF, and pulmonary hypertension.   Admitted 06/13/17 with edema and worsening SOB.   Assessment/Plan   1. Acute on Chronic diastolic CHF: Echo 40/98 with normal EF, severe LVH, moderate RV systolic function, severe pulmonary hypertension.  - Volume status looks relatively stable on exam.  - Given 80 mg of po lasix this afternoon, compared to her 40 mg BID at home.  Follow response. She previously had AKI on 60 mg q am and 40 mg q pm at home.  - TcPYP scan 05/18/17 strongly suggestive of TTR amyloidosis. She is poor candidate for therapy with limited self care and poor insight. She does not have transportation.  - Consult palliative care with PAH and amyloidosis.  2. Atrial fibrillation: Paroxysmal.  - Atrial paced on EKG 06/14/17.  - Continue warfarin and amiodarone.  - Recent LFTs/TSH normal.  She will need regular eye exam.  3. HTN:  - Meds recently decreased with orthostasis.  - Coreg is on hold with hypotension. Was on 12.5 BID at home. Will discuss with MD,  likely will need to decrease.  4. Pulmonary hypertension: Severe pulmonary hypertension on echo.  - Suspect primarily group 2, though concern for WHO group 1.  - RHC showed pulmonary arterial hypertension. V/Q scan with no chronic PE - PFTs (2/19): minimal obstruction, mild restriction, severely decreased DLCO.  - Anti-scl-70 negative, ANA negative, RF negative, anti-Jo-1 negative. SPEP without M-spike - Continue Opsumit 10 mg daily. Decompensation may coincide with starting on this. Would not stop at this time.  - Continue Adcirca 20 mg daily and titrate up.  5. CKD: Stage 3.  - Baseline appears to be 1.5-1.7.  - Continue to follow with diuresis.  6. Tricuspid regurgitation:   - Severe on last echo, 02/2017. 7. Cardiac Amyloidosis - By TcPYP scan 05/18/17. Overall poor prognosis.  - Would recommend genetic testing.  We had very long discussion about her co-morbidities and prognosis. She understands that her prognosis may be quite limited in the setting of her multiple disease states, and accepts this.  She would like "everything to be done" for the time being, including CPR and intubation if needed.  She understands while there are current and upcoming treatments for certain types of cardiac amyloid, with her age and limited mobility/transportation, she may be a poor candidate, and additionally, may not qualify based on her genetic type. She agrees to palliative care consultation.   Medication concerns reviewed with patient and pharmacy team. Barriers identified: Questionable compliance. She sees Producer, television/film/video.   Length of Stay: 2  Annamaria Helling  06/15/2017, 2:14 PM  Advanced Heart Failure Team Pager [phone] (M-F; 7a - 4p)  Please contact [facility] for night-coverage after hours (4p -7a ) and weekends on [email or web address]  Patient seen and examined with the above-signed Advanced Practice Provider and/or Housestaff. I personally reviewed laboratory data, imaging studies and  relevant notes. I independently examined the patient and formulated the important aspects of the plan. I have edited the note to reflect any of my changes or salient points. I have personally discussed the plan with the patient and/or family.  Delightful 84 y/o woman with recently diagnosed TTR cardiac amyloidosis with severe LVH and pulmonary HTN. Recent RHC and echo reviewed personally. Patient admitted with volume overload after recent initiation of ERA and PDE-5. Now improved with diuresis. Will switch diuretics back to oral. Plan will be for SNF. I suspect she will be ready for  that tomorrow am. Suspect TTR is likely wild type and if tafamdis becomes available this summer may be a candidate.   Glori Bickers, MD  9:33 PM

## 2017-06-15 NOTE — Progress Notes (Signed)
SATURATION QUALIFICATIONS: (This note is used to comply with regulatory documentation for home oxygen)  Patient Saturations on Room Air at Rest = 98%  Patient Saturations on Room Air while Ambulating = 96%  Patient Saturations on 0 Liters of oxygen while Ambulating = N/A  Please briefly explain why patient needs home oxygen: N/A

## 2017-06-15 NOTE — Progress Notes (Signed)
Family Medicine Teaching Service Daily Progress Note Intern Pager: [phone]  [name] Medical record number: [id number] Date of birth: Jun 15, 1925 Age: 84 y.o. Gender: female  Primary Care Provider: Lorene Dy, MD Consultants: HF Code Status: full, confirmed on admission   Pt Overview and Major Events to Date:  Admitted to [facility] on 06/13/17  Assessment and Plan: Natasha Fletcher is a 84 y.o. female presenting with shortness of breath x 2-3 days. PMH is significant for HFpEF (last echo 02/2017 hyperdynamic LV function, severe LVH, g3DD, moderate RV dilation, severe TR, severe pulm HTN), anemia (baseline Hgb 9-10), sick sinus syndrome with pacemaker in place, afib on warfarin, hx of R breast cancer, T2DM (last A1c unknown), HTN, CKD 3.   Shortness of breath Patient reports worsened SOB x 2-3 days at home, worsened with activity. Suspect fluid overload given CXR and recent increase in lasix prior to arrival (from 40 BID to 60mg  BID). Question role of gut edema in decreased absorption of PO lasix. BNP 553 on admission. Net positive output 370mL overnight. Wt increased 1lb to 150lb from 149lb on 3/12, patient unsure of dry weight.  -Strict I's and O's -Daily weights -transition to PO lasix 80mg  daily  -HF consulted, appreciated recommendations  -PT/OT - recommending SNF -ambulatory pulse ox   Chest pain-resolved  Patient without known CAD, risk factors include HTN, DM, african [place]. HEART score 4. History not classical (intermittent, not associated with exertion, no longer present). Troponins trended at 0.08>0.08>0.12  AKI on CKDIII Cr improved to 1.53, baseline (Cr 1.4-1.6). Losartan discontinued prior to arrival by HF team. -Daily BMP -hold ARB -avoid nephrotoxic agents  Afib Home warfarin managed by Dr. Claiborne Billings (cards). Atrial pacer in place. Hx of sick sinus syndrome. Home meds are amiodarone, coreg. -continue amiodarone -hold coreg for  bradycardia -continue coumadin per pharm  Acute on chronic HFpEF Cards conducting outpatient workup for restrictive disease given severe LVH.  PET scan 05/18/2017 suggestive of amyloidosis. -Diuresis as above -hold home coreg 2/2 bradycardia  Pulm HTN Severe on most recent echo. Patient on opsumit and adcirca at home per cards. These were started ~05/23/17, question whether opsumit can cause some fluid retention. VQ scan 05/2017 to rule out chronic PE as a cause of pulm HTN.  -continue adcirca and opsumit -appreciate HF recs  -would benefit from home O2   Anemia Stable. Hg of 9.4. Baseline appears to be 9-10. Suspect ACD, although no iron studies in our system (PCP doesn't appear to be in epic).  -daily CBC  -anemia panel wnl   T2DM On problem list per cards. PCP not in epic. A1C 5.1  Suprapubic catheter Patient reports this is due to urinating too much.  Unclear reason for placement as her urologist is not in epic. -RN has attached catheter to Foley bag -Would need to replace catheter if concern for UTI, no concern for this at present  HTN Previously on losartan, but held 05/19/17.  -hold home coreg 2/2 bradycardia  OA Hx of R TKR in 2015. Norco on home med list.  -CPAP PRN  Question cognitive decline Patient seems to have limited understanding of her disease state.  Repeatedly asks if heart failure is "serious."  No dementia meds on problem list.  Does live alone. -Will consult SLP for [facility]  Hx of breast cancer Patient reports hx of biopsy without treatment and yearly follow up.   Abnormal speech Patient stutters, per chart review appears to be chronic.  -no new neurological  workup  FEN/GI: heart healthy diet Prophylaxis: home warfarin  Disposition: would benefit from SNF placement   Subjective:  Patient today reports feeling sad. States she did not do as well on "memory test" as she thought she would. Reports breathing is significantly improved. Patient  is unsure of SNF placement. Wants to go home to take care of her dog, but will discuss placement with her brother who is coming this afternoon.   Objective: Temp:  [97.7 F (36.5 C)-98.7 F (37.1 C)] 98.7 F (37.1 C) (03/13 0800) Pulse Rate:  [60-64] 62 (03/13 0931) Resp:  [15-18] 15 (03/13 0800) BP: (90-159)/(54-72) 93/54 (03/13 0931) SpO2:  [95 %-98 %] 96 % (03/13 0800) Weight:  [150 lb 4.8 oz (68.2 kg)-150 lb 5.7 oz (68.2 kg)] 150 lb 5.7 oz (68.2 kg) (03/13 0800) Physical Exam: General: awake and alert, sitting up in bed, NAD  Cardiovascular: RRR, no MRG  Respiratory: slight crackles auscultated at bases bilaterally, apices clear, no increased work of breathing  Abdomen: soft, non tender, non distended, bowel sounds normal  Extremities: no edema, non tender   Laboratory: Recent Labs  Lab 06/13/17 0915 06/14/17 0301 06/15/17 0507  WBC 4.8 5.7 5.2  HGB 9.3* 8.9* 9.4*  HCT 30.1* 29.3* 30.4*  PLT 222 222 225   Recent Labs  Lab 06/13/17 0915 06/13/17 1144 06/14/17 0301 06/15/17 0507  NA 136  --  139 138  K 4.0  --  3.9 4.0  CL 105  --  106 104  CO2 23  --  24 24  BUN 34*  --  33* 28*  CREATININE 1.89*  --  1.68* 1.53*  CALCIUM 8.9  --  8.5* 8.7*  PROT  --  7.5  --   --   BILITOT  --  1.2  --   --   ALKPHOS  --  88  --   --   ALT  --  13*  --   --   AST  --  24  --   --   GLUCOSE 98  --  78 83    Ref. Range 06/14/2017 11:42  Iron Latest Ref Range: 28 - 170 ug/dL 40  UIBC Latest Units: ug/dL 323  TIBC Latest Ref Range: 250 - 450 ug/dL 363  Saturation Ratios Latest Ref Range: 10.4 - 31.8 % 11  Ferritin Latest Ref Range: 11 - 307 ng/mL 27  Folate Latest Ref Range: >5.9 ng/mL 8.7  Vitamin B12 Latest Ref Range: 180 - 914 pg/mL 473   Imaging/Diagnostic Tests: Dg Chest 2 View  Result Date: 06/13/2017 CLINICAL DATA:  Shortness of breath and chest pain for 3-4 days. EXAM: CHEST - 2 VIEW COMPARISON:  05/06/2017 FINDINGS: Diffuse interstitial opacity with small pleural  effusions. Chronic cardiomegaly. Stable positioning of dual-chamber pacer leads from the left. Vascular pedicle widening and aortic tortuosity. IMPRESSION: Diffuse lung opacity and small pleural effusions favoring CHF. Electronically Signed   By: Monte Fantasia M.D.   On: 06/13/2017 10:06   Nm Tumor Localization W Spect  Result Date: 05/18/2017 CLINICAL DATA:  HEART FAILURE. CONCERN FOR CARDIAC AMYLOIDOSIS. EXAM: NUCLEAR MEDICINE TUMOR LOCALIZATION. PYP CARDIAC AMYLOIDOSIS SCAN WITH SPECT TECHNIQUE: Following intravenous administration of radiopharmaceutical, anterior planar images of the chest were obtained. Regions of interest were placed on the heart and contralateral chest wall for quantitative assessment. Additional SPECT imaging of the chest was obtained. RADIOPHARMACEUTICALS:  20.2 mCi TECHNETIUM 99 PYROPHOSPHATE FINDINGS: Planar Visual assessment: Anterior planar imaging demonstrates radiotracer uptake within the  heart greater than uptake within the adjacent ribs (Grade 3). Quantitative assessment : Quantitative assessment of the cardiac uptake compared to the contralateral chest wall is equal to 1.36 . SPECT assessment: SPECT imaging of the chest demonstrates clear radiotracer accumulation within the LEFT ventricle. IMPRESSION: Visual and quantitative assessment (grade 3, H/CLL equal 1.36) are strongly suggestive of transthyretin amyloidosis. Electronically Signed   By: Kerby Moors M.D.   On: 05/18/2017 13:57     Caroline More, DO 06/15/2017, 5:31 PM PGY-1, [place] Intern pager: [phone], text pages welcome

## 2017-06-15 NOTE — Progress Notes (Signed)
[facility] for Coumadin Indication: atrial fibrillation  Allergies  Allergen Reactions  . Lactose Intolerance (Gi) Other (See Comments)    Stomach pain  . Milk-Related Compounds Other (See Comments)    Upset stomach   . Sulfa Antibiotics Itching    Assessment: 84 yo F on Coumadin prior to admission for atrial fibrillation who presented 3/11 with SOB x2-3 days and volume overload. INR on admission was elevated at 3.5. Pharmacy was consulted to continue Coumadin dosing inpatient.   Due to elevated INR, Coumadin has been held x2 days.  Today INR is down to therapeutic range at 2.27.  Home regimen is 2 mg daily except 4 mg on Mondays.  Last dose was 06/12/17.  CBC is stable. No bleeding is noted.  No new medications have been added that may interact with Coumadin.  Patient remains on home amiodarone.  Patient's po intake was 25-50% on 3/12, however this AM has improved to 100%.   Goal of Therapy:  INR 2-3 Monitor platelets by anticoagulation protocol: Yes   Plan:  Resume Coumadin today at 2 mg po x1 tonight.  Monitor daily INR  Sloan Leiter, PharmD, BCPS, BCCCP Clinical Pharmacist Clinical phone 06/15/2017 until 3:30PM - [phone] After hours, please call #[phone] 06/15/2017 11:03 AM

## 2017-06-15 NOTE — Evaluation (Signed)
Occupational Therapy Evaluation Patient Details Name: Natasha Fletcher MRN: [id number] DOB: 1926/01/08 Today's Date: 06/15/2017    History of Present Illness Pt is an 84 y.o. female admitted 06/13/17 with SOB; suspected fluid overload. PMH includes HFpEF, severe pulmonary HTN, sick sinus syndrome with pacemaker, a-fib (on warfarin), h/o R breast CA, DMII, HTN, CKD III, bilat TKA, home use of suprapubic catheter.   Clinical Impression   Pt currently requires minimal assistance with cueing during functional mobility using walker due to balance issues and shortness of breath. Pt was able to engage in hallway mobility task to continue to improve ability to implement pacing and energy conservation during functional mobility. Pt required multiple rest breaks as oxygen decreased to 95% on RA after returning to room. Pt. was educated on pursed lip breathing and required cueing for breathing during activity. Pt. appears to have slight cognitive deficits impacting her short-term memory and safety awareness. Pt. could benefit from continued skilled OT to improve activity tolerance during ADL and increase independence in functional mobility. Recommend d/c to SNF with 24/7 supervision for safety before returning home. Will continue to follow acutely.    Follow Up Recommendations  SNF;Supervision/Assistance - 24 hour    Equipment Recommendations  None recommended by OT    Recommendations for Other Services       Precautions / Restrictions Precautions Precautions: Fall Restrictions Weight Bearing Restrictions: No      Mobility Bed Mobility Overal bed mobility: Needs Assistance Bed Mobility: Supine to Sit     Supine to sit: Supervision     General bed mobility comments: for safety  Transfers Overall transfer level: Needs assistance Equipment used: Rolling walker (2 wheeled) Transfers: Sit to/from Stand Sit to Stand: Min guard         General transfer comment: for safety    Balance  Overall balance assessment: Needs assistance Sitting-balance support: No upper extremity supported Sitting balance-Leahy Scale: Fair     Standing balance support: Bilateral upper extremity supported Standing balance-Leahy Scale: Poor Standing balance comment: relied on walker                           ADL either performed or assessed with clinical judgement   ADL Overall ADL's : Needs assistance/impaired                 Upper Body Dressing : Set up;Sitting   Lower Body Dressing: Sit to/from stand;Min guard   Toilet Transfer: Minimal assistance;Ambulation;RW Toilet Transfer Details (indicate cue type and reason): Simulated         Functional mobility during ADLs: Minimal assistance;Cueing for safety;Rolling walker General ADL Comments: Pt. required verbal cues regarding obstacles in the way during functional mobility. Pt. required cueing for placement of walker. Pt. had to take multiple rest breaks for pacing and was slower to initiate and complete tasks. SpO2 >95% on RA with activity; DOE 2/4. Educated on energy conservation strategies; pt verbalized understanding     Museum/gallery curator      Pertinent Vitals/Pain Pain Assessment: No/denies pain     Hand Dominance     Extremity/Trunk Assessment Upper Extremity Assessment Upper Extremity Assessment: Generalized weakness   Lower Extremity Assessment Lower Extremity Assessment: Defer to PT evaluation   Cervical / Trunk Assessment Cervical / Trunk Assessment: Kyphotic   Communication     Cognition Arousal/Alertness: Awake/alert Behavior During Therapy: [facility] for tasks  assessed/performed Overall Cognitive Status: No family/caregiver present to determine baseline cognitive functioning Area of Impairment: Awareness;Safety/judgement;Memory                     Memory: Decreased short-term memory   Safety/Judgement: Decreased awareness of safety;Decreased awareness of  deficits Awareness: Emergent Problem Solving: Slow processing;Decreased initiation;Requires verbal cues General Comments: Pt. unaware of room number. Pt. seems to have poor awareness of deficits/severity of condition.   General Comments       Exercises     Shoulder Instructions      Home Living Family/patient expects to be discharged to:: Private residence Living Arrangements: Alone                                  Lives With: Alone(with dog)    Prior Functioning/Environment Level of Independence: Independent    ADL's / Homemaking Assistance Needed: Pt. reports she was previously independent with ADLs and taking care of her home            OT Problem List: Decreased strength;Decreased activity tolerance;Impaired balance (sitting and/or standing);Decreased coordination;Decreased cognition;Decreased safety awareness;Decreased knowledge of use of DME or AE;Cardiopulmonary status limiting activity      OT Treatment/Interventions:      OT Goals(Current goals can be found in the care plan section) Acute Rehab OT Goals Patient Stated Goal: get back home to her dog OT Goal Formulation: With patient  OT Frequency: Min 2X/week   Barriers to D/C:            Co-evaluation              AM-PAC PT "6 Clicks" Daily Activity     Outcome Measure Help from another person eating meals?: None Help from another person taking care of personal grooming?: A Little Help from another person toileting, which includes using toliet, bedpan, or urinal?: A Little Help from another person bathing (including washing, rinsing, drying)?: A Little Help from another person to put on and taking off regular upper body clothing?: A Little Help from another person to put on and taking off regular lower body clothing?: A Little 6 Click Score: 19   End of Session Equipment Utilized During Treatment: Gait belt;Rolling walker  Activity Tolerance: Patient limited by fatigue Patient  left: in chair;with call bell/phone within reach;with chair alarm set  OT Visit Diagnosis: Unsteadiness on feet (R26.81);Muscle weakness (generalized) (M62.81);History of falling (Z91.81);Dizziness and giddiness (R42);Other abnormalities of gait and mobility (R26.89)                Time: 1645-1710 OT Time Calculation (min): 25 min Charges:  OT General Charges $OT Visit: 1 Visit OT Treatments $Self Care/Home Management : 23-37 mins G-Codes:     Irma Delancey A. Ulice Brilliant, M.S., OTR/L Pager: [phone] 06/15/2017, 5:29 PM

## 2017-06-15 NOTE — Progress Notes (Signed)
Natasha Fletcher is known to me through the [facility] and enrollment in [facility].  She tells me that she was told by physical and occupational therapy that she should consider a Rehab stay after hospitalization.  If she does not discharge to SNF she will remain enrolled in the [facility].  I will inform paramedic team of her status and update when she is discharged.

## 2017-06-15 NOTE — Progress Notes (Signed)
Pt's SpO2 while sitting= 98% on room air  Pt's SpO2 while ambulating= 96% on room air

## 2017-06-16 DIAGNOSIS — Z7189 Other specified counseling: Secondary | ICD-10-CM

## 2017-06-16 DIAGNOSIS — Z515 Encounter for palliative care: Secondary | ICD-10-CM

## 2017-06-16 DIAGNOSIS — I2721 Secondary pulmonary arterial hypertension: Secondary | ICD-10-CM

## 2017-06-16 LAB — CBC
HEMATOCRIT: 29.5 % — AB (ref 36.0–46.0)
HEMOGLOBIN: 8.8 g/dL — AB (ref 12.0–15.0)
MCH: 24.5 pg — AB (ref 26.0–34.0)
MCHC: 29.8 g/dL — AB (ref 30.0–36.0)
MCV: 82.2 fL (ref 78.0–100.0)
Platelets: 234 10*3/uL (ref 150–400)
RBC: 3.59 MIL/uL — ABNORMAL LOW (ref 3.87–5.11)
RDW: 18.3 % — AB (ref 11.5–15.5)
WBC: 4.8 10*3/uL (ref 4.0–10.5)

## 2017-06-16 LAB — BASIC METABOLIC PANEL
Anion gap: 8 (ref 5–15)
BUN: 32 mg/dL — AB (ref 6–20)
CALCIUM: 8.5 mg/dL — AB (ref 8.9–10.3)
CHLORIDE: 104 mmol/L (ref 101–111)
CO2: 26 mmol/L (ref 22–32)
CREATININE: 1.71 mg/dL — AB (ref 0.44–1.00)
GFR calc Af Amer: 29 mL/min — ABNORMAL LOW (ref >60)
GFR calc non Af Amer: 25 mL/min — ABNORMAL LOW (ref >60)
GLUCOSE: 78 mg/dL (ref 65–99)
Potassium: 4.3 mmol/L (ref 3.5–5.1)
Sodium: 138 mmol/L (ref 135–145)

## 2017-06-16 LAB — PROTIME-INR
INR: 2.44
PROTHROMBIN TIME: 26.3 s — AB (ref 11.4–15.2)

## 2017-06-16 MED ORDER — FUROSEMIDE 10 MG/ML IJ SOLN
80.0000 mg | Freq: Two times a day (BID) | INTRAMUSCULAR | Status: DC
  Administered 2017-06-16 (×2): 80 mg via INTRAVENOUS
  Filled 2017-06-16 (×2): qty 8

## 2017-06-16 MED ORDER — FUROSEMIDE 40 MG PO TABS
40.0000 mg | ORAL_TABLET | Freq: Two times a day (BID) | ORAL | Status: DC
  Administered 2017-06-16: 40 mg via ORAL
  Filled 2017-06-16: qty 1

## 2017-06-16 MED ORDER — WARFARIN SODIUM 2.5 MG PO TABS
2.5000 mg | ORAL_TABLET | Freq: Once | ORAL | Status: AC
  Administered 2017-06-16: 2.5 mg via ORAL
  Filled 2017-06-16: qty 1

## 2017-06-16 NOTE — Progress Notes (Addendum)
Family Medicine Teaching Service Daily Progress Note Intern Pager: [phone]  Patient name: Ladonya Jerkins Akerley Medical record number: [id number] Date of birth: 1925-05-01 Age: 84 y.o. Gender: female  Primary Care Provider: Lorene Dy, MD Consultants: HF, palliative  Code Status: Full  Pt Overview and Major Events to Date:  Admitted to [facility] on 06/13/17  Assessment and Plan: Chiamaka Latka Meansis a 84 y.o.femalepresenting with shortness of breath x 2-3 days. PMH is significant forHFpEF (last echo 02/2017 hyperdynamic LV function, severe LVH, g3DD, moderate RV dilation, severe TR, severe pulm HTN), anemia (baseline Hgb 9-10), sick sinus syndrome with pacemaker in place, afib on warfarin, hx of R breast cancer, T2DM (last A1c unknown), HTN, CKD 3.  Shortness of breath 2/2 fluid overload. Net positive output 161mL overnight. Wt stable at 150lb, patient unsure of dry weight.  -Strict I's and O's -Daily weights -continue PO lasix 80mg  daily  -HF consulted, appreciated recommendations: IV lasix 80mg  bid, REDS vest reading -PT/OT - recommending SNF -ambulatory pulse ox: no home O2 need   Chest pain-resolved  Patient without known CAD, risk factors include HTN, DM, african [place]. HEART score 4. History not classical (intermittent, not associated with exertion, no longer present). Troponins trended at 0.08>0.08>0.12  AKIon CKDIII Cr worsening to 1.71, baseline (Cr 1.4-1.6). Losartan discontinued prior to arrival by HF team. -Daily BMP -hold ARB -avoid nephrotoxic agents -lasix split to 40mg  bid   Afib Home warfarin managed by Dr. Claiborne Billings (cards). Atrial pacer in place. Hx of sick sinus syndrome. Home meds are amiodarone, coreg. -continue amiodarone -hold coreg for bradycardia -continue coumadinper pharm  Acute on chronic HFpEF Cards conducting outpatient workup for restrictive disease given severe LVH. PET scan 05/18/2017 suggestive of amyloidosis. -Diuresis as above -hold home  coreg 2/2 bradycardia -recommended for palliative care consultation per HF team   Pulm HTN Severe on most recent echo. Patient on opsumit and adcirca at home per cards. These were started ~05/23/17, question whether opsumit can cause some fluid retention. VQ scan 05/2017 to rule out chronic PE as a cause of pulm HTN.  -continue adcirca and opsumit -appreciate HF recs  -would benefit from home O2   Anemia Stable. Hg of 8.8. Baseline appears to be 9-10. Suspect ACD, although no iron studies in our system (PCP doesn't appear to be in epic).  -daily CBC  -anemia panel wnl   T2DM On problem list per cards. PCP not in epic. A1C 5.1  Suprapubic catheter Patient reports this is due to urinating too much. Unclear reason for placement as her urologist is not in epic. -RN has attached catheter to Foley bag -Would need to replace catheter if concern for UTI, no concern for this at present  HTN Currently hypotensive to 101/41. Previously on losartan, but held 05/19/17.  -hold home coreg 2/2 bradycardia  OA Hx of R TKR in 2015. Norco on home med list.  -CPAP PRN  Question cognitive decline Patient seems to have limited understanding of her disease state. Repeatedly asks if heart failure is "serious." No dementia meds on problem list. Does live alone. -Will consult SLP for [facility] -recommended for SNF placement   Hx of breast cancer Patient reports hx of biopsy without treatment and yearly follow up.   Abnormal speech Patient stutters, per chart review appears to be chronic.  -no new neurological workup  FEN/GI:heart healthy diet Prophylaxis:home warfarin  Disposition: SNF   Subjective:  Patient today reports her "head is a mess". States she feels dizzy. Denies SOB or  chest pain.   Objective: Temp:  [97.5 F (36.4 C)-98.2 F (36.8 C)] 97.8 F (36.6 C) (03/14 1105) Pulse Rate:  [59-61] 59 (03/14 1105) Resp:  [16-18] 16 (03/14 1105) BP: (99-138)/(41-64) 119/50 (03/14  1105) SpO2:  [95 %-96 %] 95 % (03/14 1105) Physical Exam: General: awake and alert, sitting up in bed, NAD Cardiovascular: RRR, no MRG Respiratory: slight crackles in Right base, improved from exam on 3/13, no increased work of breathing  Abdomen: soft, non tender, non distended, bowel sounds normal  Extremities: non tender, no edema   Laboratory: Recent Labs  Lab 06/14/17 0301 06/15/17 0507 06/16/17 0629  WBC 5.7 5.2 4.8  HGB 8.9* 9.4* 8.8*  HCT 29.3* 30.4* 29.5*  PLT 222 225 234   Recent Labs  Lab 06/13/17 1144 06/14/17 0301 06/15/17 0507 06/16/17 0629  NA  --  139 138 138  K  --  3.9 4.0 4.3  CL  --  106 104 104  CO2  --  24 24 26   BUN  --  33* 28* 32*  CREATININE  --  1.68* 1.53* 1.71*  CALCIUM  --  8.5* 8.7* 8.5*  PROT 7.5  --   --   --   BILITOT 1.2  --   --   --   ALKPHOS 88  --   --   --   ALT 13*  --   --   --   AST 24  --   --   --   GLUCOSE  --  78 83 78     Ref. Range 06/16/2017 06:29  Prothrombin Time Latest Ref Range: 11.4 - 15.2 seconds 26.3 (H)  INR Unknown 2.44    Imaging/Diagnostic Tests: Dg Chest 2 View  Result Date: 06/13/2017 CLINICAL DATA:  Shortness of breath and chest pain for 3-4 days. EXAM: CHEST - 2 VIEW COMPARISON:  05/06/2017 FINDINGS: Diffuse interstitial opacity with small pleural effusions. Chronic cardiomegaly. Stable positioning of dual-chamber pacer leads from the left. Vascular pedicle widening and aortic tortuosity. IMPRESSION: Diffuse lung opacity and small pleural effusions favoring CHF. Electronically Signed   By: Monte Fantasia M.D.   On: 06/13/2017 10:06   Nm Tumor Localization W Spect  Result Date: 05/18/2017 CLINICAL DATA:  HEART FAILURE. CONCERN FOR CARDIAC AMYLOIDOSIS. EXAM: NUCLEAR MEDICINE TUMOR LOCALIZATION. PYP CARDIAC AMYLOIDOSIS SCAN WITH SPECT TECHNIQUE: Following intravenous administration of radiopharmaceutical, anterior planar images of the chest were obtained. Regions of interest were placed on the heart and  contralateral chest wall for quantitative assessment. Additional SPECT imaging of the chest was obtained. RADIOPHARMACEUTICALS:  20.2 mCi TECHNETIUM 99 PYROPHOSPHATE FINDINGS: Planar Visual assessment: Anterior planar imaging demonstrates radiotracer uptake within the heart greater than uptake within the adjacent ribs (Grade 3). Quantitative assessment : Quantitative assessment of the cardiac uptake compared to the contralateral chest wall is equal to 1.36 . SPECT assessment: SPECT imaging of the chest demonstrates clear radiotracer accumulation within the LEFT ventricle. IMPRESSION: Visual and quantitative assessment (grade 3, H/CLL equal 1.36) are strongly suggestive of transthyretin amyloidosis. Electronically Signed   By: Kerby Moors M.D.   On: 05/18/2017 13:57     Caroline More, DO 06/16/2017, 2:12 PM PGY-1, [place] Intern pager: [phone], text pages welcome

## 2017-06-16 NOTE — Progress Notes (Signed)
ReDs vest reading =41

## 2017-06-16 NOTE — Progress Notes (Signed)
Physical Therapy Treatment Patient Details Name: Natasha Fletcher Milich MRN: [id number] DOB: 1926-02-06 Today's Date: 06/16/2017    History of Present Illness Pt is an 84 y.o. female admitted 06/13/17 with SOB; pt found to have acute on chronic diastolic heart failure. Pt also with findings consistent with cardiac amyloidosis.  PMH includes HFpEF, severe pulmonary HTN, sick sinus syndrome with pacemaker, a-fib (on warfarin), h/o R breast CA, DMII, HTN, CKD III, bilat TKA, home use of suprapubic catheter.    PT Comments    Pt continues to be limited by dyspnea with mobility. SpO2 >93% during mobility. Due to pt's low functional activity tolerance continue to feel pt needs SNF at DC.   Follow Up Recommendations  SNF     Equipment Recommendations  None recommended by PT    Recommendations for Other Services       Precautions / Restrictions Precautions Precautions: Fall Restrictions Weight Bearing Restrictions: No    Mobility  Bed Mobility               General bed mobility comments: Pt up in chair  Transfers Overall transfer level: Needs assistance Equipment used: Rolling walker (2 wheeled) Transfers: Sit to/from Stand Sit to Stand: Min guard         General transfer comment: for safety  Ambulation/Gait Ambulation/Gait assistance: Min guard Ambulation Distance (Feet): 50 Feet(15' x 1, 30' x 1, 50' x 1) Assistive device: 4-wheeled walker Gait Pattern/deviations: Step-through pattern;Decreased stride length;Trunk flexed Gait velocity: decr Gait velocity interpretation: <1.8 ft/sec, indicative of risk for recurrent falls General Gait Details: Assist for balance and safety. Pt amb on RA with dyspnea 3-4/4 and SpO2 >93%.   Stairs            Wheelchair Mobility    Modified Rankin (Stroke Patients Only)       Balance Overall balance assessment: Needs assistance Sitting-balance support: No upper extremity supported Sitting balance-Leahy Scale: Fair      Standing balance support: Bilateral upper extremity supported Standing balance-Leahy Scale: Poor Standing balance comment: walker and min guard static standing                            Cognition Arousal/Alertness: Awake/alert Behavior During Therapy: WFL for tasks assessed/performed Overall Cognitive Status: No family/caregiver present to determine baseline cognitive functioning Area of Impairment: Awareness;Safety/judgement;Memory                     Memory: Decreased short-term memory   Safety/Judgement: Decreased awareness of safety;Decreased awareness of deficits Awareness: Emergent Problem Solving: Slow processing;Decreased initiation;Requires verbal cues        Exercises      General Comments        Pertinent Vitals/Pain Pain Assessment: No/denies pain    Home Living                      Prior Function            PT Goals (current goals can now be found in the care plan section) Acute Rehab PT Goals Patient Stated Goal: get back home to her dog Progress towards PT goals: Progressing toward goals    Frequency    Min 2X/week      PT Plan Current plan remains appropriate    Co-evaluation              AM-PAC PT "6 Clicks" Daily Activity  Outcome Measure  Difficulty turning  over in bed (including adjusting bedclothes, sheets and blankets)?: A Little Difficulty moving from lying on back to sitting on the side of the bed? : A Little Difficulty sitting down on and standing up from a chair with arms (e.g., wheelchair, bedside commode, etc,.)?: Unable Help needed moving to and from a bed to chair (including a wheelchair)?: A Little Help needed walking in hospital room?: A Little Help needed climbing 3-5 steps with a railing? : A Lot 6 Click Score: 15    End of Session Equipment Utilized During Treatment: Gait belt Activity Tolerance: Patient limited by fatigue;Other (comment)(dyspnea) Patient left: in chair;with call  bell/phone within reach;with bed alarm set   PT Visit Diagnosis: Other abnormalities of gait and mobility (R26.89);Muscle weakness (generalized) (M62.81)     Time: 8182-9937 PT Time Calculation (min) (ACUTE ONLY): 27 min  Charges:  $Gait Training: 23-37 mins                    G Codes:       [facility] PT [place] 06/16/2017, 2:08 PM

## 2017-06-16 NOTE — Clinical Social Work Note (Signed)
CSW met with patient to discuss SNF. She wants to take tonight to think it over but is agreeable to CSW sending out referral. CSW will follow up tomorrow. If she goes to SNF, [facility] would be first preference.  Dayton Scrape, [place]

## 2017-06-16 NOTE — Consult Note (Signed)
Consultation Note Date: 06/16/2017   [name]  DOB: 12/30/1925  MRN: [id number]  Age / Sex: 84 y.o., female  PCP: Lorene Dy, MD Referring Physician: Zenia Resides, MD  Reason for Consultation: Establishing goals of care and Psychosocial/spiritual support  HPI/Patient Profile: 84 y.o. female  admitted on 06/13/2017 with admitted  with shortness of breath x 2-3 days.  PMH is significant for HFpEF (last echo 02/2017 hyperdynamic LV function, severe LVH, g3DD, moderate RV dilation, severe TR, severe pulm HTN), anemia (baseline Hgb 9-10), sick sinus syndrome with pacemaker in place, afib on warfarin, hx of R breast cancer, T2DM (last A1c unknown), HTN, CKD 3.   PET scan 05/18/2017 suggestive of amyloidosis. Pulmonary HTN is severe.  Significant memory deficits noted on cognitive assessment.  Patient lives alone with her brothers assistance.  Patient and her family face treatment option decision, advanced directive decisions and anticipatory care needs.  Clinical Assessment and Goals of Care:  This NP Wadie Lessen reviewed medical records, received report from team, assessed the patient and then meet at the patient's bedside  to discuss diagnosis, prognosis, GOC, EOL wishes disposition and options.       Concept ofPalliative Care was discussed  A  discussion was had today regarding advanced directives.  Concepts specific to code status, was had.   Values and goals of care important to patient and family were attempted to be elicited.   Patient currently lives alone independently with the support of her brother.  Verbalizes the importance of maintaining her independence and ensuring that her miniature poodle named Bo remains with her.  Attempted to contact brother but I have not heard back from him and have not been able to catch him while he is visiting in the room.  Questions  and concerns addressed.   Family encouraged to call with questions or concerns.    PMT will continue to support holistically.   PATIENT-there is no documented healthcare power of attorney.  Patient is encouraged to secure while she is here in the hospital    SUMMARY OF RECOMMENDATIONS    Code Status/Advance Care Planning:  Full code    Symptom Management:   Weakness: Continued physical therapy and speech therapy on disposition if eligible  Palliative Prophylaxis:   Bowel Regimen and Delirium Protocol  Additional Recommendations (Limitations, Scope, Preferences):  Full Scope Treatment  Psycho-social/Spiritual:   Desire for further Chaplaincy support:no   Prognosis:   Unable to determine   Discharge Planning:   Patient is willing to transition to short-term rehab for continued physical therapy.  Ultimately patient hopes to return home and remain as independent as possible for as long as possible. Discussed this situation with both the attending and case management     Primary Diagnoses: Present on Admission: . Shortness of breath   I have reviewed the medical record, interviewed the patient and family, and examined the patient. The following aspects are pertinent.  Past Medical History:  Diagnosis Date  . Arthritis    "all  over"  . Asthma   . Atrial fibrillation ([place])    a. s/p DCCV in 01/2014 b. repeat DCCV in 06/2015, started on Amiodarone at that time. On Coumadin for anticoagulation.  . Cancer ([place])    rt breast  . Chronic diastolic CHF (congestive heart failure) ([place])    a. 08/2015: Echo with EF of 60-65%, no WMA, LA mod dilated, mild TR.  . Diabetes mellitus without complication ([place])   . Edema leg   . Fast breathing    "because of pill"  . GERD (gastroesophageal reflux disease)   . Glaucoma   . Glaucoma   . Hypertension   . Incontinence   . Pneumonia    h/o younger  . S/P total knee arthroplasty   . SSS (sick sinus syndrome) ([place])    a.  s/p [employer] Advisa DR MRI [id number] [id number] (serial number [id number]) PPM placement in 08/2015  . Swelling    bilateral feet/ legs, more left.  . Thyroid disease    "something years ago"  . UTI (urinary tract infection) 02/12/2015  . Wears dentures   . Wears glasses    Social History   Socioeconomic History  . Marital status: Single    Spouse name: None  . Number of children: None  . Years of education: None  . Highest education level: None  Social Needs  . Financial resource strain: None  . Food insecurity - worry: None  . Food insecurity - inability: None  . Transportation needs - medical: None  . Transportation needs - non-medical: None  Occupational History  . None  Tobacco Use  . Smoking status: Former Smoker    Packs/day: 0.00    Years: 30.00    Pack years: 0.00    Types: Cigarettes    Last attempt to quit: 04/21/1975    Years since quitting: 42.1  . Smokeless tobacco: Never Used  Substance and Sexual Activity  . Alcohol use: Yes    Comment: occasionally  . Drug use: No  . Sexual activity: Not Currently  Other Topics Concern  . None  Social History Narrative   From SNF, getting PT and using cane.     Family History  Problem Relation Age of Onset  . Heart disease Mother   . Stroke Mother   . Hypertension Mother   . Stroke Father   . Hypertension Father   . Stroke Brother   . Heart attack Neg Hx    Scheduled Meds: . amiodarone  100 mg Oral Daily  . ferrous sulfate  325 mg Oral Q48H  . furosemide  80 mg Intravenous BID  . latanoprost  1 drop Both Eyes QHS  . Macitentan  10 mg Oral Daily  . sodium chloride flush  3 mL Intravenous Q12H  . tadalafil (PAH)  20 mg Oral Daily  . traZODone  50 mg Oral QHS  . Warfarin - Pharmacist Dosing Inpatient   Does not apply q1800   Continuous Infusions: PRN Meds:.acetaminophen **OR** acetaminophen, HYDROcodone-acetaminophen Medications Prior to Admission:  Prior to Admission medications   Medication Sig Start Date End  Date Taking? Authorizing Provider  amiodarone (PACERONE) 200 MG tablet Take 0.5 tablets (100 mg total) by mouth daily. 03/01/17  Yes Camnitz, Will Hassell Done, MD  carvedilol (COREG) 12.5 MG tablet Take 1 tablet (12.5 mg total) by mouth 2 (two) times daily. 05/09/17  Yes Larey Dresser, MD  furosemide (LASIX) 20 MG tablet Take 2 tablets (40 mg total) by mouth 2 (two)  times daily. 05/10/17  Yes Larey Dresser, MD  HYDROcodone-acetaminophen [facility]) 10-325 MG tablet Take 1 tablet by mouth 3 (three) times daily as needed (for pain.).  11/03/15  Yes [provider]  latanoprost (XALATAN) 0.005 % ophthalmic solution Place 1 drop into both eyes at bedtime.   Yes [provider]  losartan (COZAAR) 25 MG tablet Take 25 mg by mouth daily. 05/24/17  Yes [provider]  Macitentan (OPSUMIT) 10 MG TABS Take 1 tablet (10 mg total) by mouth daily. 05/04/17  Yes Larey Dresser, MD  tadalafil, PAH, (ADCIRCA) 20 MG tablet Take 1 tablet (20 mg total) by mouth daily. 05/26/17  Yes Larey Dresser, MD  traZODone (DESYREL) 50 MG tablet Take 50 mg by mouth at bedtime. 06/07/17  Yes [provider]  warfarin (COUMADIN) 4 MG tablet TAKE 1/2 TO 1 TABLET BY MOUTH DAILY AS DIRECTED BY COUMADIN CLINIC 06/09/17  Yes Troy Sine, MD  warfarin (COUMADIN) 4 MG tablet TAKE 1/2 TO 1 TABLET BY MOUTH DAILY AS DIRECTED BY COUMADIN CLINIC Patient taking differently: TAKE 0.5 TABLET (2 MG) BY MOUTH EVERY EVENING, EXCEPT TAKE 1 TABLET (4 MG) BY MOUTH ON MONDAYS DAILY IN THE EVENING AS DIRECTED BY COUMADIN CLINIC 12/14/16   Troy Sine, MD   Allergies  Allergen Reactions  . Lactose Intolerance (Gi) Other (See Comments)    Stomach pain  . Milk-Related Compounds Other (See Comments)    Upset stomach   . Sulfa Antibiotics Itching   Review of Systems  Constitutional: Positive for fatigue.  Neurological: Positive for weakness.  Psychiatric/Behavioral: Decreased concentration:      Physical Exam    Constitutional: She is oriented to person, place, and time. She appears well-developed.  Cardiovascular: Normal rate and regular rhythm.  Pulmonary/Chest: Effort normal.  Neurological: She is alert and oriented to person, place, and time.  Skin: Skin is warm and dry.    Vital Signs: BP (!) 119/50 (BP Location: Right Arm)   Pulse (!) 59   Temp 97.8 F (36.6 C) (Oral)   Resp 16   Ht 5\' 4"  (1.626 m)   Wt 68.2 kg (150 lb 5.7 oz)   SpO2 95%   BMI 25.81 kg/m  Pain Assessment: No/denies pain   Pain Score: 4    SpO2: SpO2: 95 % O2 Device:SpO2: 95 % O2 Flow Rate: .   IO: Intake/output summary:   Intake/Output Summary (Last 24 hours) at 06/16/2017 1324 Last data filed at 06/16/2017 1304 Gross per 24 hour  Intake 720 ml  Output 625 ml  Net 95 ml    LBM: Last BM Date: 06/13/17 Baseline Weight: Weight: 68.4 kg (150 lb 12.7 oz) Most recent weight: Weight: 68.2 kg (150 lb 5.7 oz)      Palliative Assessment/Data: 40 % at best   Discussed with Dr Tammi Klippel and Hassan Rowan CMRN  Time In: 1000 Time Out: 1115 Time Total: 75 min Greater than 50%  of this time was spent counseling and coordinating care related to the above assessment and plan.  Signed by: Wadie Lessen, NP   Please contact Palliative Medicine Team phone at [phone] for questions and concerns.  For individual provider: See Shea Evans

## 2017-06-16 NOTE — Progress Notes (Addendum)
Patient ID: Natasha Fletcher, female   DOB: [birth year], 84 y.o.   MRN: [id number]     Advanced Heart Failure Rounding Note  PCP-Cardiologist: Shelva Majestic, MD  HF Cardiology: Aundra Dubin  Subjective:    I/Os don't appear accurate, weight has not changed.  This morning, she feels more short of breath.  She was short of breath walking to the door of her room.  Creatinine mildly increased to 1.7.    Objective:   Weight Range: 150 lb 5.7 oz (68.2 kg) Body mass index is 25.81 kg/m.   Vital Signs:   Temp:  [97.5 F (36.4 C)-98.2 F (36.8 C)] 98.2 F (36.8 C) (03/13 2120) Pulse Rate:  [60-61] 61 (03/14 0820) Resp:  [16-18] 16 (03/13 2120) BP: (99-138)/(41-64) 138/64 (03/14 0820) SpO2:  [96 %] 96 % (03/13 2120) Last BM Date: 06/13/17  Weight change: Filed Weights   06/14/17 0536 06/15/17 0453 06/15/17 0800  Weight: 149 lb 11.1 oz (67.9 kg) 150 lb 4.8 oz (68.2 kg) 150 lb 5.7 oz (68.2 kg)    Intake/Output:   Intake/Output Summary (Last 24 hours) at 06/16/2017 1037 Last data filed at 06/16/2017 0838 Gross per 24 hour  Intake 600 ml  Output 805 ml  Net -205 ml      Physical Exam    General:  Well appearing. No resp difficulty HEENT: Normal Neck: Supple. JVP difficult, ?mildly elevated. Carotids 2+ bilat; no bruits. No lymphadenopathy or thyromegaly appreciated. Cor: PMI nondisplaced. Regular rate & rhythm. No rubs, gallops.  3/6 HSM LLSB. Lungs: Clear Abdomen: Soft, nontender, nondistended. No hepatosplenomegaly. No bruits or masses. Good bowel sounds. Extremities: No cyanosis, clubbing, rash, edema Neuro: Alert & orientedx3, cranial nerves grossly intact. moves all 4 extremities w/o difficulty. Affect pleasant   Telemetry   A-paced, v-sensed   Labs    CBC Recent Labs    06/15/17 0507 06/16/17 0629  WBC 5.2 4.8  HGB 9.4* 8.8*  HCT 30.4* 29.5*  MCV 82.2 82.2  PLT 225 381   Basic Metabolic Panel Recent Labs    06/13/17 1537  06/15/17 0507 06/16/17 0629  NA   --    < > 138 138  K  --    < > 4.0 4.3  CL  --    < > 104 104  CO2  --    < > 24 26  GLUCOSE  --    < > 83 78  BUN  --    < > 28* 32*  CREATININE  --    < > 1.53* 1.71*  CALCIUM  --    < > 8.7* 8.5*  MG 2.5*  --   --   --    < > = values in this interval not displayed.   Liver Function Tests Recent Labs    06/13/17 1144  AST 24  ALT 13*  ALKPHOS 88  BILITOT 1.2  PROT 7.5  ALBUMIN 3.6   No results for input(s): LIPASE, AMYLASE in the last 72 hours. Cardiac Enzymes Recent Labs    06/13/17 1537 06/13/17 2026 06/14/17 0301  TROPONINI 0.08* 0.08* 0.12*    BNP: BNP (last 3 results) Recent Labs    06/13/17 0915  BNP 553.0*    ProBNP (last 3 results) Recent Labs    01/17/17 1438  PROBNP 4,311*     D-Dimer No results for input(s): DDIMER in the last 72 hours. Hemoglobin A1C Recent Labs    06/13/17 1537  HGBA1C 5.1   Fasting Lipid  Panel No results for input(s): CHOL, HDL, LDLCALC, TRIG, CHOLHDL, LDLDIRECT in the last 72 hours. Thyroid Function Tests Recent Labs    06/13/17 1537  TSH 0.536    Other results:   Imaging     No results found.   Medications:     Scheduled Medications: . amiodarone  100 mg Oral Daily  . ferrous sulfate  325 mg Oral Q48H  . furosemide  80 mg Intravenous BID  . latanoprost  1 drop Both Eyes QHS  . Macitentan  10 mg Oral Daily  . sodium chloride flush  3 mL Intravenous Q12H  . tadalafil (PAH)  20 mg Oral Daily  . traZODone  50 mg Oral QHS  . Warfarin - Pharmacist Dosing Inpatient   Does not apply q1800     Infusions:   PRN Medications:  acetaminophen **OR** acetaminophen, HYDROcodone-acetaminophen    Assessment/Plan   1. Acute on Chronic diastolic CHF: Echo 90/24 with normal EF, severe LVH, moderate RV systolic function, severe pulmonary hypertension and severe TR.  She has TTR amyloidosis as well.  She is subjectively more short of breath today.  Exam is somewhat difficult for volume excess, but she  has not diuresed appreciably this admission. - It would help to have accurate I/Os. Hopefully with foley in place we can get them.  - Will give Lasix 80 mg IV bid today give acute worsening.  Will get REDS vest reading to try to better assess her volume status.  -Volume status looks relatively stable on exam.  - May need to go home on Lasix 60 mg po bid.  - TcPYP scan 05/18/17 strongly suggestive ofTTR amyloidosis. Will need to send genetic testing => if wild-type TTR, may be candidate for tafamidis treatment when it comes available. - Consult palliative care with PAH and amyloidosis.  2. Atrial fibrillation: Paroxysmal. Currently a-pacing.  - Continue warfarin and amiodarone.  3. OXB:DZHG recently decreased with orthostasis. Coreg is on hold with hypotension. Probably restart eventually at 3.125 mg bid.  4. 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  RHC showed pulmonary arterial hypertension. V/Q scan with no chronic PE.  PFTs (2/19): minimal obstruction, mild restriction, severely decreased DLCO. Anti-scl-70 negative, ANA negative, RF negative, anti-Jo-1 negative. SPEP with M-spike (needs serum/urine immunofixation).   - Continue current Opsumit and Adcirca, though decompensated CHF may date to start of these meds.  Will see if we can control with diuresis.   5. CKD: Stage 3.Baseline appears to be 1.5-1.7.  - Continue to follow with diuresis.  6. Tricuspid regurgitation:  Severe on last echo, 02/2017. 7. 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 Overall poor prognosis.  - Will need genetic testing.  She will need SNF at discharge.    Length of Stay: 3  Loralie Champagne, MD  06/16/2017, 10:37 AM  Advanced Heart Failure Team Pager [phone] (M-F; 7a - 4p)  Please contact [facility] for night-coverage after hours (4p -7a ) and weekends on [email or web address]

## 2017-06-16 NOTE — Progress Notes (Signed)
  Speech Language Pathology Treatment: Cognitive-Linquistic  Patient Details Name: Natasha Fletcher MRN: [id number] DOB: 10/05/1925 Today's Date: 06/16/2017 Time: 3734-2876 SLP Time Calculation (min) (ACUTE ONLY): 28 min  Assessment / Plan / Recommendation Clinical Impression  Pt was seen for skilled ST targeting cognitive goals.  Pt was meeting with Palliative Care NP upon arrival who remained to observe treatment session.  SLP reviewed results of yesterday's cognitive evaluation.  Pt remembers working with therapist as well as being surprised by how difficult certain portions of the assessment were; however, she has difficulty recalling specific details of assessment or conversations with therapist regarding prognosis.  Pt provided more information regarding baseline level of function during conversations regarding discharge planning.  Pt's brother not only helped with transportation to and from doctor's appointments, he also shopped for pt, walked pt's dog, checked over pt's meds and bills, and cleaned pt's apartment.  Pt reports noticing difficulty remembering to pay her bills and take her medicines at baseline over the last year.  SLP discussed memory compensatory strategies as well as rationale for recommendation that pt have 24/7 supervision at discharge.  All pt's questions were answered to her satisfaction at this time. No further ST needs are indicated for acute level of care, however, pt may benefit from ST follow up at SNF prior to discharge home.    HPI HPI: Pt is an 84 y.o. female admitted 06/13/17 with SOB; suspected fluid overload. PMH includes HFpEF, severe pulmonary HTN, sick sinus syndrome with pacemaker, a-fib (on warfarin), h/o R breast CA, DMII, HTN, CKD III, bilat TKA, home use of suprapubic catheter.      SLP Plan  Continue with current plan of care       Recommendations                   Follow up Recommendations: 24 hour supervision/assistance;Skilled Nursing  facility SLP Visit Diagnosis: Cognitive communication deficit (O11.572) Plan: Continue with current plan of care       GO                Marnee Sherrard, Selinda Orion 06/16/2017, 12:12 PM

## 2017-06-16 NOTE — Plan of Care (Signed)
  Clinical Measurements: Diagnostic test results will improve 06/16/2017 2351 - Progressing by Kim Oki A, RN   Clinical Measurements: Ability to maintain clinical measurements within normal limits will improve 06/16/2017 2352 - Progressing by Dashana Guizar, Roma Kayser, RN

## 2017-06-16 NOTE — Clinical Social Work Placement (Signed)
   CLINICAL SOCIAL WORK PLACEMENT  NOTE  Date:  06/16/2017  Patient Details  Name: Natasha Fletcher MRN: [id number] Date of Birth: 1926-02-25  Clinical Social Work is seeking post-discharge placement for this patient at the [place] level of care (*CSW will initial, date and re-position this form in  chart as items are completed):  Yes   Patient/family provided with [place] Work Department's list of facilities offering this level of care within the geographic area requested by the patient (or if unable, by the patient's family).  Yes   Patient/family informed of their freedom to choose among providers that offer the needed level of care, that participate in Medicare, Medicaid or managed care program needed by the patient, have an available bed and are willing to accept the patient.  Yes   Patient/family informed of [facility]'s ownership interest in [facility] and [facility], as well as of the fact that they are under no obligation to receive care at these facilities.  PASRR submitted to EDS on 06/16/17     PASRR number received on       Existing PASRR number confirmed on 06/16/17     FL2 transmitted to all facilities in geographic area requested by pt/family on 06/16/17     FL2 transmitted to all facilities within larger geographic area on       Patient informed that his/her managed care company has contracts with or will negotiate with certain facilities, including the following:            Patient/family informed of bed offers received.  Patient chooses bed at       Physician recommends and patient chooses bed at      Patient to be transferred to   on  .  Patient to be transferred to facility by       Patient family notified on   of transfer.  Name of family member notified:        PHYSICIAN Please sign FL2     Additional Comment:    _______________________________________________ Candie Chroman, LCSW 06/16/2017, 4:16  PM

## 2017-06-16 NOTE — Progress Notes (Signed)
[place] for Coumadin Indication: atrial fibrillation  Allergies  Allergen Reactions  . Lactose Intolerance (Gi) Other (See Comments)    Stomach pain  . Milk-Related Compounds Other (See Comments)    Upset stomach   . Sulfa Antibiotics Itching    Assessment: 84 yo F on Coumadin prior to admission for atrial fibrillation who presented 3/11 with SOB x2-3 days and volume overload. INR on admission was elevated at 3.5. Pharmacy was consulted to continue Coumadin dosing inpatient.   Due to elevated INR, Coumadin has been held x2 days (last dose was on 3/10). INR yesterday on day of restart was 2.7- decreased to 2.44 today. Hgb down slightly to 8.8, plts WNL. No signs/symptoms of bleeding. On home amiodarone.   Goal of Therapy:  INR 2-3 Monitor platelets by anticoagulation protocol: Yes   Plan:  Order warfarin 2.5 mg po x1 tonight.  Monitor daily INR/CBC Monitor for signs/symptoms of bleeding  Doylene Canard, PharmD Clinical Pharmacist  Pager: [phone] Clinical Phone for 06/16/2017 until 3:30pm: [phone] If after 3:30pm, please call main pharmacy at [phone] 06/16/2017 2:25 PM

## 2017-06-16 NOTE — NC FL2 (Signed)
[place] MEDICAID FL2 LEVEL OF CARE SCREENING TOOL     IDENTIFICATION  [name] Birthdate: 08-01-1925 Sex: female Admission Date (Current Location): 06/13/2017  [facility] and [place] Number:  Herbalist and Address:  [facility] [place]. [facility], [place] [address], [place], [place] [address]      Provider Number: [id number]  Attending Physician Name and Address:  Zenia Resides, MD  Relative Name and Phone Number:       Current Level of Care: Hospital Recommended Level of Care: [place] Prior Approval Number:    Date Approved/Denied:   PASRR Number: [id number]  Discharge Plan: SNF    Current Diagnoses: Patient Active Problem List   Diagnosis Date Noted  . Acute on chronic congestive heart failure ([place])   . Acute on chronic diastolic CHF (congestive heart failure) ([place])   . Sick sinus syndrome due to SA node dysfunction (HCC)   . Shortness of breath 06/13/2017  . Atrial fibrillation ([place]) 03/22/2016  . Bradycardia 08/22/2015  . Junctional (nodal) bradycardia 08/22/2015  . Malnutrition of moderate degree 03/19/2015  . Benign essential HTN   . Bradycardia, drug induced 03/18/2015  . Hypokalemia 02/16/2015  . Thrombocytopenia ([place]) 02/16/2015  . Sepsis due to urinary tract infection ([place])   . Sepsis ([place]) 02/12/2015  . Urinary tract infectious disease   . Long-term (current) use of anticoagulants 07/12/2014  . Chronic anticoagulation 02/11/2014  . Hypertensive cardiovascular disease 01/24/2014  . Chronic diastolic CHF (congestive heart failure) ([place]) 01/24/2014  . Anemia 01/24/2014  . Breast cancer of upper-inner quadrant of right female breast ([place]) 01/24/2014  . Acute diastolic heart failure ([place]) 11/11/2013  . Atrial flutter ([place]) 11/11/2013  . PAF (paroxysmal atrial fibrillation) ([place]) 11/10/2013  . Slurred speech 11/10/2013  . Protein-calorie malnutrition, severe ([place]) 11/06/2013  . Coffee ground emesis  11/05/2013  . Dehydration 10/19/2013  . Urinary retention 10/18/2013  . GERD (gastroesophageal reflux disease) 03/20/2013  . S/P total knee arthroplasty   . UTI (urinary tract infection) 02/21/2013  . OA (osteoarthritis) of knee 02/19/2013  . Hypertension   . Thyroid disease   . Cancer of central portion of female breast ([place]) 04/14/2011    Orientation RESPIRATION BLADDER Height & Weight     Self, Time, Situation, Place  Normal Incontinent, Indwelling catheter(Chronic foley (3 years)) Weight: 150 lb 5.7 oz (68.2 kg) Height:  5\' 4"  (162.6 cm)  BEHAVIORAL SYMPTOMS/MOOD NEUROLOGICAL BOWEL NUTRITION STATUS  (None) (None) Continent Diet(Heart healthy)  AMBULATORY STATUS COMMUNICATION OF NEEDS Skin   Limited Assist Verbally Normal                       Personal Care Assistance Level of Assistance  Bathing, Feeding, Dressing Bathing Assistance: Limited assistance Feeding assistance: Limited assistance Dressing Assistance: Limited assistance     Functional Limitations Info  Sight, Hearing, Speech Sight Info: Adequate Hearing Info: Adequate Speech Info: Adequate    SPECIAL CARE FACTORS FREQUENCY  PT (By licensed PT), OT (By licensed OT)     PT Frequency: 5 x week OT Frequency: 5 x week            Contractures Contractures Info: Not present    Additional Factors Info  Code Status, Allergies Code Status Info: Full Allergies Info: Lactose Intolerance (Gi), Milk-related compounds, Sulfa Antibiotics.           Current Medications (06/16/2017):  This is the current hospital active medication list Current Facility-Administered  Medications  Medication Dose Route Frequency Provider Last Rate Last Dose  . acetaminophen (TYLENOL) tablet 650 mg  650 mg Oral Q6H PRN Sela Hilding, MD   650 mg at 06/15/17 1704   Or  . acetaminophen (TYLENOL) suppository 650 mg  650 mg Rectal Q6H PRN Sela Hilding, MD      . amiodarone (PACERONE) tablet 100 mg  100 mg Oral  Daily Sela Hilding, MD   100 mg at 06/16/17 0820  . ferrous sulfate tablet 325 mg  325 mg Oral Q48H Abraham, Sherin, DO   325 mg at 06/15/17 1423  . furosemide (LASIX) injection 80 mg  80 mg Intravenous BID Larey Dresser, MD   80 mg at 06/16/17 1059  . HYDROcodone-acetaminophen (NORCO) 10-325 MG per tablet 1 tablet  1 tablet Oral TID PRN Sela Hilding, MD   1 tablet at 06/16/17 0820  . latanoprost (XALATAN) 0.005 % ophthalmic solution 1 drop  1 drop Both Eyes QHS Sela Hilding, MD   1 drop at 06/15/17 2213  . Macitentan TABS 10 mg  10 mg Oral Daily Sela Hilding, MD   10 mg at 06/16/17 0956  . sodium chloride flush (NS) 0.9 % injection 3 mL  3 mL Intravenous Q12H Sela Hilding, MD   3 mL at 06/16/17 1000  . tadalafil (PAH) (ADCIRCA) tablet 20 mg  20 mg Oral Daily Sela Hilding, MD   20 mg at 06/16/17 0820  . traZODone (DESYREL) tablet 50 mg  50 mg Oral QHS Sela Hilding, MD   50 mg at 06/15/17 2202  . warfarin (COUMADIN) tablet 2.5 mg  2.5 mg Oral ONCE-1800 Hensel, Jamal Collin, MD      . Warfarin - Pharmacist Dosing Inpatient   Does not apply q1800 Reginia Naas, [facility]         Discharge Medications: Please see discharge summary for a list of discharge medications.  Relevant Imaging Results:  Relevant Lab Results:   Additional Information SS#: [id number]  Candie Chroman, LCSW

## 2017-06-17 DIAGNOSIS — Z7189 Other specified counseling: Secondary | ICD-10-CM

## 2017-06-17 DIAGNOSIS — Z515 Encounter for palliative care: Secondary | ICD-10-CM

## 2017-06-17 LAB — BASIC METABOLIC PANEL
Anion gap: 10 (ref 5–15)
BUN: 34 mg/dL — AB (ref 6–20)
CO2: 22 mmol/L (ref 22–32)
Calcium: 8.5 mg/dL — ABNORMAL LOW (ref 8.9–10.3)
Chloride: 104 mmol/L (ref 101–111)
Creatinine, Ser: 1.67 mg/dL — ABNORMAL HIGH (ref 0.44–1.00)
GFR calc Af Amer: 30 mL/min — ABNORMAL LOW (ref >60)
GFR, EST NON AFRICAN AMERICAN: 26 mL/min — AB (ref >60)
Glucose, Bld: 86 mg/dL (ref 65–99)
POTASSIUM: 4.1 mmol/L (ref 3.5–5.1)
SODIUM: 136 mmol/L (ref 135–145)

## 2017-06-17 LAB — CBC
HCT: 29.8 % — ABNORMAL LOW (ref 36.0–46.0)
Hemoglobin: 9.2 g/dL — ABNORMAL LOW (ref 12.0–15.0)
MCH: 25.5 pg — AB (ref 26.0–34.0)
MCHC: 30.9 g/dL (ref 30.0–36.0)
MCV: 82.5 fL (ref 78.0–100.0)
PLATELETS: 228 10*3/uL (ref 150–400)
RBC: 3.61 MIL/uL — AB (ref 3.87–5.11)
RDW: 18.4 % — ABNORMAL HIGH (ref 11.5–15.5)
WBC: 5 10*3/uL (ref 4.0–10.5)

## 2017-06-17 LAB — PROTIME-INR
INR: 2.18
Prothrombin Time: 24.1 seconds — ABNORMAL HIGH (ref 11.4–15.2)

## 2017-06-17 LAB — IMMUNOFIXATION, URINE

## 2017-06-17 MED ORDER — ACETAMINOPHEN 650 MG RE SUPP: 650.0000 mg | Freq: Four times a day (QID) | RECTAL | Status: DC | PRN

## 2017-06-17 MED ORDER — ACETAMINOPHEN 500 MG PO TABS
500.0000 mg | ORAL_TABLET | Freq: Four times a day (QID) | ORAL | Status: DC | PRN
  Administered 2017-06-19 – 2017-06-20 (×2): 500 mg via ORAL
  Filled 2017-06-17 (×2): qty 1

## 2017-06-17 MED ORDER — POLYETHYLENE GLYCOL 3350 17 G PO PACK
17.0000 g | PACK | Freq: Every day | ORAL | Status: DC
  Administered 2017-06-17 – 2017-06-20 (×4): 17 g via ORAL
  Filled 2017-06-17 (×3): qty 1

## 2017-06-17 MED ORDER — HYDROCODONE-ACETAMINOPHEN 7.5-325 MG PO TABS
1.0000 | ORAL_TABLET | Freq: Four times a day (QID) | ORAL | Status: DC | PRN
  Administered 2017-06-17 – 2017-06-20 (×12): 1 via ORAL
  Filled 2017-06-17 (×12): qty 1

## 2017-06-17 MED ORDER — FUROSEMIDE 40 MG PO TABS
60.0000 mg | ORAL_TABLET | Freq: Two times a day (BID) | ORAL | Status: DC
  Administered 2017-06-17 – 2017-06-18 (×2): 60 mg via ORAL
  Filled 2017-06-17 (×3): qty 1

## 2017-06-17 MED ORDER — WARFARIN SODIUM 3 MG PO TABS
3.0000 mg | ORAL_TABLET | Freq: Once | ORAL | Status: AC
  Administered 2017-06-17: 3 mg via ORAL
  Filled 2017-06-17: qty 1

## 2017-06-17 MED ORDER — CARVEDILOL 3.125 MG PO TABS
3.1250 mg | ORAL_TABLET | Freq: Two times a day (BID) | ORAL | Status: DC
Start: 2017-06-17 — End: 2017-06-20
  Administered 2017-06-17 – 2017-06-20 (×5): 3.125 mg via ORAL
  Filled 2017-06-17 (×6): qty 1

## 2017-06-17 MED ORDER — DONEPEZIL HCL 5 MG PO TABS
5.0000 mg | ORAL_TABLET | Freq: Every day | ORAL | Status: DC
  Administered 2017-06-17 – 2017-06-19 (×3): 5 mg via ORAL
  Filled 2017-06-17 (×3): qty 1
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
## 2017-06-17 NOTE — Progress Notes (Addendum)
Family Medicine Teaching Service Daily Progress Note Intern Pager: [phone]  [name] Medical record number: [id number] Date of birth: [birth year] Age: 84 y.o. Gender: female  Primary Care Provider: Lorene Dy, MD Consultants: HF, Palliative, PT/OT/SLP Code Status: Full   Pt Overview and Major Events to Date:  [facility]13[facility]17  Assessment and Plan: Aracelly Tencza Meansis a 84 y.o.femalepresenting with shortness of breath x 2-3 days. PMH is significant forHFpEF (last echo 02/2017 hyperdynamic LV function, severe LVH, g3DD, moderate RV dilation, severe TR, severe pulm HTN), anemia (baseline Hgb 9-10), sick sinus syndrome with pacemaker in place, afib on warfarin, hx of R breast cancer, T2DM (last A1c unknown), HTN, CKD 3.  Shortness of breath 2/2 fluid overload. Net negative -1.150L overnight. Wt increased to 152lbs from 150lb, patient unsure of dry weight.  -Strict I's and O's -Daily weights -continue lasix per HF recs  -HF consulted, appreciated recommendations -PT/OT - recommending SNF -ambulatory pulse ox: no home O2 need   Chest pain-resolved  Patient without known CAD, risk factors include HTN, DM, african [place]. HEART score 4. History not classical (intermittent, not associated with exertion, no longer present). Troponins trended at 0.08>0.08>0.12  AKIon CKDIII Cr improving to 1.67, baseline (Cr 1.4-1.6). Losartan discontinued prior to arrival by HF team. -Daily BMP -hold ARB -avoid nephrotoxic agents  Afib Home warfarin managed by Dr. Claiborne Billings (cards). Atrial pacer in place. Hx of sick sinus syndrome. Home meds are amiodarone, coreg. -continue amiodarone -hold coreg for bradycardia -continue coumadinper pharm  Acute on chronic HFpEF Cards conducting outpatient workup for restrictive disease given severe LVH. PET scan 05/18/2017 suggestive of amyloidosis. Reds Vest Elevated -Diuresis as above -hold home coreg 2/2  bradycardia -recommended for palliative care consultation per HF team   Pulm HTN Severe on most recent echo. Patient on opsumit and adcirca at home per cards. These were started ~05/23/17, question whether opsumit can cause some fluid retention. VQ scan 05/2017 to rule out chronic PE as a cause of pulm HTN.  -continue adcirca and opsumit -appreciate HF recs  -would benefit from home O2   Anemia Stable. Hg of 9.2. Baseline appears to be 9-10. Suspect ACD, although no iron studies in our system (PCP doesn't appear to be in epic).  -daily CBC  -anemia panelwnl  T2DM On problem list per cards. PCP not in epic. A1C 5.1  Suprapubic catheter Patient reports this is due to urinating too much. Unclear reason for placement as her urologist is not in epic. -RN has attached catheter to Foley bag -Would need to replace catheter if concern for UTI, no concern for this at present  HTN Currently hypotensive to 116/46. Previously on losartan, but held 05/19/17.  -hold home coreg 2/2 bradycardia  OA Hx of R TKR in 2015. Norco on home med list.  -CPAP PRN  Question cognitive decline Patient seems to have limited understanding of her disease state. Repeatedly asks if heart failure is "serious." No dementia meds on problem list. Does live alone. 20/30 for MOCA performed by SLP -recommended for SNF placement  -Aricept 5mg  daily  -decreased Norco   Hx of breast cancer Patient reports hx of biopsy without treatment and yearly follow up.   Abnormal speech Patient stutters, per chart review appears to be chronic.  -no new neurological workup  Visual Hallucinations Patient reports seeing her sister washing dishes at sink by room door last evening. Patient also reports seeing her pet dog in the room. No auditory hallucinations.  No hallucinations that. Likely sundowning -will continue to monitor -encourage re-orientation   FEN/GI:heart healthy diet Prophylaxis:home  warfarin  Disposition: would benefit from SNF placement when medically stable   Subjective:  Patient today states she feels down. Is concerned about losing her independence and having to give up her dog. I explained to her that SNF is temporary and she would not need to give up her dog permanently. Patient states breathing is improved and denies CP. Reports some right lower extremity pain by shin. Patient endorses visual hallucinations stating she saw her sister by the door washing dishes and her pet dog was also in room. Sister did not speak. No voices heard. No hallucinations to harm herself.   Objective: Temp:  [98.1 F (36.7 C)-98.6 F (37 C)] 98.6 F (37 C) (03/15 1104) Pulse Rate:  [60-66] 60 (03/15 1104) Resp:  [16-18] 16 (03/15 1104) BP: (116-128)/(46-76) 123/76 (03/15 1104) SpO2:  [94 %-99 %] 99 % (03/15 1104) Weight:  [152 lb 11.2 oz (69.3 kg)] 152 lb 11.2 oz (69.3 kg) (03/15 0424) Physical Exam: General: awake and alert, laying in bed, NAD Cardiovascular: RRR, no MRG Respiratory: apices clear, minimal crackles at bases but improved since 3/14, no increased WOB, able to speak full senteces  Abdomen: soft, non tender, non distended, bowel sounds normal  Extremities: no edema, non tender to palpation   Laboratory: Recent Labs  Lab 06/15/17 0507 06/16/17 0629 06/17/17 0810  WBC 5.2 4.8 5.0  HGB 9.4* 8.8* 9.2*  HCT 30.4* 29.5* 29.8*  PLT 225 234 228   Recent Labs  Lab 06/13/17 1144  06/15/17 0507 06/16/17 0629 06/17/17 0810  NA  --    < > 138 138 136  K  --    < > 4.0 4.3 4.1  CL  --    < > 104 104 104  CO2  --    < > 24 26 22   BUN  --    < > 28* 32* 34*  CREATININE  --    < > 1.53* 1.71* 1.67*  CALCIUM  --    < > 8.7* 8.5* 8.5*  PROT 7.5  --   --   --   --   BILITOT 1.2  --   --   --   --   ALKPHOS 88  --   --   --   --   ALT 13*  --   --   --   --   AST 24  --   --   --   --   GLUCOSE  --    < > 83 78 86   < > = values in this interval not displayed.     Imaging/Diagnostic Tests: Dg Chest 2 View  Result Date: 06/13/2017 CLINICAL DATA:  Shortness of breath and chest pain for 3-4 days. EXAM: CHEST - 2 VIEW COMPARISON:  05/06/2017 FINDINGS: Diffuse interstitial opacity with small pleural effusions. Chronic cardiomegaly. Stable positioning of dual-chamber pacer leads from the left. Vascular pedicle widening and aortic tortuosity. IMPRESSION: Diffuse lung opacity and small pleural effusions favoring CHF. Electronically Signed   By: Monte Fantasia M.D.   On: 06/13/2017 10:06   Nm Tumor Localization W Spect  Result Date: 05/18/2017 CLINICAL DATA:  HEART FAILURE. CONCERN FOR CARDIAC AMYLOIDOSIS. EXAM: NUCLEAR MEDICINE TUMOR LOCALIZATION. PYP CARDIAC AMYLOIDOSIS SCAN WITH SPECT TECHNIQUE: Following intravenous administration of radiopharmaceutical, anterior planar images of the chest were obtained. Regions of interest were placed on the heart and  contralateral chest wall for quantitative assessment. Additional SPECT imaging of the chest was obtained. RADIOPHARMACEUTICALS:  20.2 mCi TECHNETIUM 99 PYROPHOSPHATE FINDINGS: Planar Visual assessment: Anterior planar imaging demonstrates radiotracer uptake within the heart greater than uptake within the adjacent ribs (Grade 3). Quantitative assessment : Quantitative assessment of the cardiac uptake compared to the contralateral chest wall is equal to 1.36 . SPECT assessment: SPECT imaging of the chest demonstrates clear radiotracer accumulation within the LEFT ventricle. IMPRESSION: Visual and quantitative assessment (grade 3, H/CLL equal 1.36) are strongly suggestive of transthyretin amyloidosis. Electronically Signed   By: Kerby Moors M.D.   On: 05/18/2017 13:57     Caroline More, DO 06/17/2017, 12:13 PM PGY-1, [place] Intern pager: [phone], text pages welcome

## 2017-06-17 NOTE — Progress Notes (Signed)
[facility] for Coumadin Indication: atrial fibrillation  Allergies  Allergen Reactions  . Lactose Intolerance (Gi) Other (See Comments)    Stomach pain  . Milk-Related Compounds Other (See Comments)    Upset stomach   . Sulfa Antibiotics Itching    Assessment: 84 yo F on Coumadin prior to admission for atrial fibrillation who presented 3/11 with SOB x2-3 days and volume overload. INR on admission was elevated at 3.5. Pharmacy was consulted to continue Coumadin dosing inpatient. Home regimen: 2mg /day except 4mg  on Mon.  Due to elevated INR, Coumadin has been held x2 days (last dose was on 3/10). INR upon restart was 2.7; now down to 2.18 today. Hgb is stable at 9.2, plts WNL. No signs/symptoms of bleeding. On home amiodarone.   Goal of Therapy:  INR 2-3 Monitor platelets by anticoagulation protocol: Yes   Plan:  Order warfarin 3 mg po x1 tonight.  Monitor daily INR/CBC Monitor for signs/symptoms of bleeding  Doylene Canard, PharmD Clinical Pharmacist  Pager: [phone] Clinical Phone for 06/17/2017 until 3:30pm: [phone] If after 3:30pm, please call main pharmacy at [phone] 06/17/2017 3:52 PM

## 2017-06-17 NOTE — Care Management Important Message (Signed)
Important Message  Patient Details  Name: Natasha Fletcher MRN: [id number] Date of Birth: 1925-04-17   Medicare Important Message Given:  Yes    Tanna Loeffler P Dovber Ernest 06/17/2017, 3:35 PM

## 2017-06-17 NOTE — Progress Notes (Signed)
Clinical Social Worker following patient for discharge plan. CSW met patient at bedside to discuss discharge plan. Patient stated she has had time to think about it and has decided to discharge to a SNF for short term rehab. Patient stated she would like to discharge to [facility] place since she hs been there in the past. Patient asked CSW if she would be bale to discharge today. CSW stated that before patient can discharge to [facility] will need to be attained.   CSW reached out to admission coordinator at [facility] and she stated they will go ahead and start authorization through insurance and will contact CSW once they receive it. Facility requested that patient please discharge with her personal supply of [place] Tabs (40m) which is being kept at [facility] CSW relayed request to MD, who stated that patient should be able to discharge with her medication.   ARhea Pink MSW,  [place]

## 2017-06-18 DIAGNOSIS — R4189 Other symptoms and signs involving cognitive functions and awareness: Secondary | ICD-10-CM

## 2017-06-18 DIAGNOSIS — R0602 Shortness of breath: Secondary | ICD-10-CM

## 2017-06-18 LAB — CBC
HEMATOCRIT: 29.4 % — AB (ref 36.0–46.0)
HEMOGLOBIN: 9 g/dL — AB (ref 12.0–15.0)
MCH: 25 pg — AB (ref 26.0–34.0)
MCHC: 30.6 g/dL (ref 30.0–36.0)
MCV: 81.7 fL (ref 78.0–100.0)
Platelets: 231 10*3/uL (ref 150–400)
RBC: 3.6 MIL/uL — ABNORMAL LOW (ref 3.87–5.11)
RDW: 18.1 % — AB (ref 11.5–15.5)
WBC: 4.2 10*3/uL (ref 4.0–10.5)

## 2017-06-18 LAB — PROTIME-INR
INR: 1.82
Prothrombin Time: 21 seconds — ABNORMAL HIGH (ref 11.4–15.2)

## 2017-06-18 LAB — BASIC METABOLIC PANEL
Anion gap: 10 (ref 5–15)
BUN: 31 mg/dL — AB (ref 6–20)
CHLORIDE: 103 mmol/L (ref 101–111)
CO2: 24 mmol/L (ref 22–32)
CREATININE: 1.46 mg/dL — AB (ref 0.44–1.00)
Calcium: 8.6 mg/dL — ABNORMAL LOW (ref 8.9–10.3)
GFR calc Af Amer: 36 mL/min — ABNORMAL LOW (ref >60)
GFR calc non Af Amer: 31 mL/min — ABNORMAL LOW (ref >60)
Glucose, Bld: 86 mg/dL (ref 65–99)
Potassium: 4.2 mmol/L (ref 3.5–5.1)
Sodium: 137 mmol/L (ref 135–145)

## 2017-06-18 MED ORDER — BISMUTH SUBSALICYLATE 262 MG PO CHEW
524.0000 mg | CHEWABLE_TABLET | ORAL | Status: DC | PRN
  Filled 2017-06-18: qty 2

## 2017-06-18 MED ORDER — ALUM & MAG HYDROXIDE-SIMETH 200-200-20 MG/5ML PO SUSP
30.0000 mL | ORAL | Status: DC | PRN
  Administered 2017-06-18 – 2017-06-19 (×2): 30 mL via ORAL
  Filled 2017-06-18 (×2): qty 30

## 2017-06-18 MED ORDER — FUROSEMIDE 80 MG PO TABS
80.0000 mg | ORAL_TABLET | Freq: Two times a day (BID) | ORAL | Status: DC
Start: 2017-06-18 — End: 2017-06-20
  Administered 2017-06-18 – 2017-06-20 (×4): 80 mg via ORAL
  Filled 2017-06-18 (×4): qty 1

## 2017-06-18 MED ORDER — WARFARIN SODIUM 5 MG PO TABS
5.0000 mg | ORAL_TABLET | Freq: Once | ORAL | Status: AC
  Administered 2017-06-18: 5 mg via ORAL
  Filled 2017-06-18: qty 1

## 2017-06-18 NOTE — Progress Notes (Addendum)
[facility] for Coumadin Indication: atrial fibrillation  Allergies  Allergen Reactions  . Lactose Intolerance (Gi) Other (See Comments)    Stomach pain  . Milk-Related Compounds Other (See Comments)    Upset stomach   . Sulfa Antibiotics Itching   Assessment: 84 yo F on Coumadin prior to admission for atrial fibrillation who presented 3/11 with SOB x2-3 days and volume overload. INR on admission was elevated at 3.5. Pharmacy was consulted to continue Coumadin dosing inpatient. PTA:  Regimen: 2mg /day except 4mg  on Mon.  Due to elevated INR, Coumadin has been held x2 days (last dose was on 3/10). INR upon restart was 2.7; now down to 1.82 today. Hgb is stable at 9.0, plts WNL. No signs/symptoms of bleeding. On home amiodarone.   Drug/Drug Interaction Potential: 1.  Warfarin + Amiodarone - inc. INR, risk of bleeding 2.  Trazadone + Amiodarone - inc. Risk for QTc prolongation  Goal of Therapy:  INR 2-3 Monitor platelets by anticoagulation protocol: Yes   Plan:  Order warfarin 5 mg po x1 tonight.  Monitor daily INR/CBC Monitor for signs/symptoms of bleeding  Rober Minion, PharmD., MS Clinical Pharmacist Pager:  [phone] Thank you for allowing pharmacy to be part of this patients care team. 06/18/2017 11:37 AM

## 2017-06-18 NOTE — Plan of Care (Signed)
Patient expressed strong desire to go to short term SNF for rehab, PT/OT.  Patient eager to talk to MD, LCSW about how she can progress from rehab to go back to her home with possible HH so that she can live independently again.

## 2017-06-18 NOTE — Plan of Care (Signed)
Messaged teaching Servcs, per c/o GI discomfort, heartburn, no PRNs on MAR.

## 2017-06-18 NOTE — Progress Notes (Addendum)
Family Medicine Teaching Service Daily Progress Note Intern Pager: [phone]  [name] Medical record number: [id number] Date of birth: 12/05/1925 Age: 84 y.o. Gender: female  Primary Care Provider: Lorene Dy, MD Consultants: HF, Palliative, PT/OT/SLP Code Status: Full   Pt Overview and Major Events to Date:  Admitted to [facility] on 06/13/17  Assessment and Plan: Natasha Fletcher a 84 y.o.femalepresenting with shortness of breath x 2-3 days. PMH is significant forHFpEF (last echo 02/2017 hyperdynamic LV function, severe LVH, g3DD, moderate RV dilation, severe TR, severe pulm HTN), anemia (baseline Hgb 9-10), sick sinus syndrome with pacemaker in place, afib on warfarin, hx of R breast cancer, T2DM (last A1c unknown), HTN, CKD 3.  Shortness of breath 2/2 fluid overload. Feels much better. Notable for some bibasilar crackles. Output of only 850 cc this AM. Wt increased 153 lbs today. -Strict I's and O's -Daily weights -continue lasix per HF recs   -HF consulted, appreciated recommendations -PT/OT - recommending SNF -ambulatory pulse ox: no home O2 need   AKIon CKDIII Cr 1.46 today,  baseline (Cr 1.4-1.6). Losartan discontinued prior to arrival by HF team. -Daily BMP -hold ARB -avoid nephrotoxic agents  Afib Home warfarin managed by Dr. Claiborne Billings (cards). Atrial pacer in place. Hx of sick sinus syndrome. Home meds are amiodarone, coreg. -continue amiodarone -hold coreg for bradycardia -continue coumadinper pharm  Acute on chronic HFpEF Cards conducting outpatient workup for restrictive disease given severe LVH. PET scan 05/18/2017 suggestive of amyloidosis. Reds Vest Elevated -Diuresis as above -hold home coreg 2/2 bradycardia -recommended for palliative care consultation per HF team   Pulm HTN Severe on most recent echo. Patient on opsumit and adcirca at home per cards. These were started ~05/23/17, question whether opsumit can cause some fluid  retention. VQ scan 05/2017 to rule out chronic PE as a cause of pulm HTN.  -continue adcirca and opsumit -appreciate HF recs  -would benefit from home O2   Anemia Stable. Hg of 9.2. Baseline appears to be 9-10. Suspect ACD, although no iron studies in our system (PCP doesn't appear to be in epic).  -daily CBC  -anemia panelwnl  T2DM On problem list per cards. PCP not in epic. A1C 5.1  Suprapubic catheter Patient reports this is due to urinating too much. Unclear reason for placement as her urologist is not in epic. -RN has attached catheter to Foley bag -Would need to replace catheter if concern for UTI, no concern for this at present  HTN Currently hypotensive to 116/46. Previously on losartan, but held 05/19/17.  -hold home coreg 2/2 bradycardia  OA Hx of R TKR in 2015. Norco on home med list.  -CPAP PRN  Question cognitive decline Patient seems to have limited understanding of her disease state. Repeatedly asks if heart failure is "serious." No dementia meds on problem list. Does live alone. 20/30 for MOCA performed by SLP -recommended for SNF placement  -Aricept 5mg  daily  -decreased Norco   Hx of breast cancer Patient reports hx of biopsy without treatment and yearly follow up.   Abnormal speech Patient stutters, per chart review appears to be chronic.  -no new neurological workup  Visual Hallucinations: During admission visual hallucinations, concerning for sundowning. No complaints this AM  -will continue to monitor -encourage re-orientation   FEN/GI:heart healthy diet Prophylaxis:home warfarin  Disposition: Awaiting SNF placement    Subjective:  Doing well this morning. No pain complaints. No nausea or vomiting. Feels like her breathing is improving overall  Objective:  Temp:  [98.1 F (36.7 C)-98.6 F (37 C)] 98.1 F (36.7 C) (03/16 0535) Pulse Rate:  [60] 60 (03/16 0535) Resp:  [16] 16 (03/16 0535) BP: (122-130)/(55-76) 123/60 (03/16  0535) SpO2:  [94 %-99 %] 96 % (03/16 0535) Weight:  [153 lb 1.6 oz (69.4 kg)] 153 lb 1.6 oz (69.4 kg) (03/16 0535) Physical Exam: General: awake and alert, laying in bed, NAD Cardiovascular: RRR, no MRG Respiratory: apices clear, bibasilar crackles noted , no increased WOB Abdomen: soft, non tender, non distended, bowel sounds normal  Extremities: no edema, non tender to palpation   Laboratory: Recent Labs  Lab 06/15/17 0507 06/16/17 0629 06/17/17 0810  WBC 5.2 4.8 5.0  HGB 9.4* 8.8* 9.2*  HCT 30.4* 29.5* 29.8*  PLT 225 234 228   Recent Labs  Lab 06/13/17 1144  06/15/17 0507 06/16/17 0629 06/17/17 0810  NA  --    < > 138 138 136  K  --    < > 4.0 4.3 4.1  CL  --    < > 104 104 104  CO2  --    < > 24 26 22   BUN  --    < > 28* 32* 34*  CREATININE  --    < > 1.53* 1.71* 1.67*  CALCIUM  --    < > 8.7* 8.5* 8.5*  PROT 7.5  --   --   --   --   BILITOT 1.2  --   --   --   --   ALKPHOS 88  --   --   --   --   ALT 13*  --   --   --   --   AST 24  --   --   --   --   GLUCOSE  --    < > 83 78 86   < > = values in this interval not displayed.    Imaging/Diagnostic Tests: Dg Chest 2 View  Result Date: 06/13/2017 CLINICAL DATA:  Shortness of breath and chest pain for 3-4 days. EXAM: CHEST - 2 VIEW COMPARISON:  05/06/2017 FINDINGS: Diffuse interstitial opacity with small pleural effusions. Chronic cardiomegaly. Stable positioning of dual-chamber pacer leads from the left. Vascular pedicle widening and aortic tortuosity. IMPRESSION: Diffuse lung opacity and small pleural effusions favoring CHF. Electronically Signed   By: Monte Fantasia M.D.   On: 06/13/2017 10:06     Tonette Bihari, MD 06/18/2017, 6:04 AM PGY-3, [place] Intern pager: [phone], text pages welcome

## 2017-06-18 NOTE — Progress Notes (Signed)
Notes reviewed Awaiting SNF  Cardiology to see as needed over the weekend Please call with questions  Thompson Grayer MD, [facility] 06/18/2017 12:18 PM

## 2017-06-18 NOTE — Plan of Care (Addendum)
MD- Discussed with and Patient asking if she is a candidate for the PNA vaccine before she is transferred to a short term SNF for rehab.   Last received in 2014

## 2017-06-19 ENCOUNTER — Other Ambulatory Visit: Payer: Self-pay | Admitting: Cardiovascular Disease

## 2017-06-19 LAB — BASIC METABOLIC PANEL
Anion gap: 10 (ref 5–15)
BUN: 30 mg/dL — ABNORMAL HIGH (ref 6–20)
CALCIUM: 8.7 mg/dL — AB (ref 8.9–10.3)
CO2: 21 mmol/L — AB (ref 22–32)
CREATININE: 1.38 mg/dL — AB (ref 0.44–1.00)
Chloride: 106 mmol/L (ref 101–111)
GFR calc non Af Amer: 33 mL/min — ABNORMAL LOW (ref >60)
GFR, EST AFRICAN AMERICAN: 38 mL/min — AB (ref >60)
Glucose, Bld: 82 mg/dL (ref 65–99)
Potassium: 4.4 mmol/L (ref 3.5–5.1)
SODIUM: 137 mmol/L (ref 135–145)

## 2017-06-19 LAB — CBC
HCT: 28.1 % — ABNORMAL LOW (ref 36.0–46.0)
Hemoglobin: 8.8 g/dL — ABNORMAL LOW (ref 12.0–15.0)
MCH: 26 pg (ref 26.0–34.0)
MCHC: 31.3 g/dL (ref 30.0–36.0)
MCV: 83.1 fL (ref 78.0–100.0)
Platelets: 223 10*3/uL (ref 150–400)
RBC: 3.38 MIL/uL — ABNORMAL LOW (ref 3.87–5.11)
RDW: 18.3 % — ABNORMAL HIGH (ref 11.5–15.5)
WBC: 3.8 10*3/uL — ABNORMAL LOW (ref 4.0–10.5)

## 2017-06-19 LAB — PROTIME-INR
INR: 1.83
Prothrombin Time: 21 seconds — ABNORMAL HIGH (ref 11.4–15.2)

## 2017-06-19 MED ORDER — WARFARIN SODIUM 3 MG PO TABS
6.0000 mg | ORAL_TABLET | Freq: Once | ORAL | Status: AC
  Administered 2017-06-19: 6 mg via ORAL
  Filled 2017-06-19: qty 2

## 2017-06-19 MED ORDER — SERTRALINE HCL 50 MG PO TABS
25.0000 mg | ORAL_TABLET | Freq: Every day | ORAL | Status: DC
  Administered 2017-06-19 – 2017-06-20 (×2): 25 mg via ORAL
  Filled 2017-06-19 (×2): qty 1

## 2017-06-19 NOTE — Progress Notes (Signed)
Family Medicine Teaching Service Daily Progress Note Intern Pager: [phone]  [name] Medical record number: [id number] Date of birth: 1926-03-03 Age: 84 y.o. Gender: female  Primary Care Provider: Lorene Dy, MD Consultants: HF, Palliative, PT/OT/SLP Code Status: Full   Pt Overview and Major Events to Date:  Admitted to [facility] on 06/13/17  Assessment and Plan: Natasha Rief Meansis a 84 y.o.femalepresenting with shortness of breath x 2-3 days. PMH is significant forHFpEF (last echo 02/2017 hyperdynamic LV function, severe LVH, g3DD, moderate RV dilation, severe TR, severe pulm HTN), anemia (baseline Hgb 9-10), sick sinus syndrome with pacemaker in place, afib on warfarin, hx of R breast cancer, T2DM (last A1c unknown), HTN, CKD 3.  Shortness of breath 2/2 CHF exacerbation. Likely 2/2 fluid overload.  This morning pt reports she feels much better.  Lung exam unremarkable and no increased work of breathing. Currently receiving  po Lasix 80 mg BID with UOP of 1300 cc.  Wt 153>152 lbs today.   -Strict I's and O's -Daily weights -continue lasix per HF recs    -HF consulted, appreciate recommendations -PT/OT - recommending SNF -ambulatory pulse ox: no home O2 need   AKIon CKDIII SCr improving from labs yesterday 1.46>1.38 today.   Baseline (Cr 1.4-1.6). Losartan discontinued prior to arrival by HF team. -Daily BMP -hold ARB -avoid nephrotoxic agents  Afib Controlled, HR 59 this AM.   Home warfarin managed by Dr. Claiborne Billings (cards). Atrial pacer in place. Hx of sick sinus syndrome. Home meds are amiodarone, coreg. -continue amiodarone -Continue to hold coreg for bradycardia -continue coumadinper pharm  Acute on chronic HFpEF Cards conducting outpatient workup for restrictive disease given severe LVH. PET scan 05/18/2017 suggestive of amyloidosis. Reds Vest Elevated -Diuresis as above -hold home coreg 2/2 bradycardia -recommended for palliative care  consultation per HF team   Pulm HTN Severe on most recent echo. Patient on opsumit and adcirca at home per cards. These were started ~05/23/17, question whether opsumit can cause some fluid retention. Negative VQ scan 05/2017  to rule out chronic PE as a cause of pulm HTN.  -continue adcirca and opsumit -appreciate HF recs  -would benefit from home O2   Anemia Stable this AM at 8.8. Baseline appears to be 9-10. Suspect ACD, although no iron studies in our system (PCP doesn't appear to be in epic).  Anemia panelobtained and wnl. Transfusion threshold of 8.0 in setting of CHF.   -monitor daily CBC   T2DM On problem list per cards. PCP not in epic. A1C 5.1.  -Continue to monitor   Suprapubic catheter Patient reports this is due to urinating too much. Unclear reason for placement as her urologist is not in epic. -RN has attached catheter to Foley bag -Would need to replace catheter if concern for UTI, no concern for this at present  HTN Currently hypotensive to 116/46. Previously on losartan, but held 05/19/17.  -hold home coreg 2/2 bradycardia  OA Hx of R TKR in 2015. Norco on home med list.  -CPAP PRN  Question cognitive decline Patient seems to have limited understanding of her disease state. Repeatedly asks if heart failure is "serious." No dementia meds on problem list. Does live alone. 20/30 for MOCA performed by SLP -recommended for SNF placement  -Aricept 5mg  daily  -decreased Norco   Hx of breast cancer Patient reports hx of biopsy without treatment and yearly follow up.   Abnormal speech Patient stutters, per chart review appears to be chronic.  -no new neurological  workup  Visual Hallucinations: During admission with visual hallucinations, concerning for sundowning. No complaints this AM.   -will continue to monitor -encourage re-orientation   FEN/GI:heart healthy diet Prophylaxis:home warfarin   Disposition: Awaiting SNF placement   Subjective:   Pt feels tired this morning but otherwise well.  Denies SOB, CP, palpitations.  She feels like her breathing has improved with diuresis.  She expresses that she would like to be discharged to a SNF.  She hopes to return home to the same quality of life and ability as she had prior to hospitalization.     Objective: Temp:  [97.9 F (36.6 C)-98.4 F (36.9 C)] 97.9 F (36.6 C) (03/17 0520) Pulse Rate:  [59-62] 61 (03/17 0606) Resp:  [18] 18 (03/17 0520) BP: (107-114)/(44-51) 114/47 (03/17 0520) SpO2:  [95 %-98 %] 98 % (03/17 0606) Weight:  [152 lb 11.2 oz (69.3 kg)] 152 lb 11.2 oz (69.3 kg) (03/17 0520)   Physical Exam: General: pleasant 84 y/o female, alert, well appearing  Cardiovascular: RRR, no MRG  Respiratory: CTAB, no crackles noted, normal effort  Abdomen: soft, NTND, +bs  Extremities: no edema in BLLE, nontender   Laboratory: Recent Labs  Lab 06/17/17 0810 06/18/17 0554 06/19/17 0704  WBC 5.0 4.2 3.8*  HGB 9.2* 9.0* 8.8*  HCT 29.8* 29.4* 28.1*  PLT 228 231 223   Recent Labs  Lab 06/13/17 1144  06/17/17 0810 06/18/17 0554 06/19/17 0704  NA  --    < > 136 137 137  K  --    < > 4.1 4.2 4.4  CL  --    < > 104 103 106  CO2  --    < > 22 24 21*  BUN  --    < > 34* 31* 30*  CREATININE  --    < > 1.67* 1.46* 1.38*  CALCIUM  --    < > 8.5* 8.6* 8.7*  PROT 7.5  --   --   --   --   BILITOT 1.2  --   --   --   --   ALKPHOS 88  --   --   --   --   ALT 13*  --   --   --   --   AST 24  --   --   --   --   GLUCOSE  --    < > 86 86 82   < > = values in this interval not displayed.    Imaging/Diagnostic Tests: Dg Chest 2 View  Result Date: 06/13/2017 CLINICAL DATA:  Shortness of breath and chest pain for 3-4 days. EXAM: CHEST - 2 VIEW COMPARISON:  05/06/2017 FINDINGS: Diffuse interstitial opacity with small pleural effusions. Chronic cardiomegaly. Stable positioning of dual-chamber pacer leads from the left. Vascular pedicle widening and aortic tortuosity. IMPRESSION:  Diffuse lung opacity and small pleural effusions favoring CHF. Electronically Signed   By: Monte Fantasia M.D.   On: 06/13/2017 10:06     Lovenia Kim, MD 06/19/2017, 9:06 AM PGY-2, [place] Intern pager: [phone], text pages welcome

## 2017-06-19 NOTE — Progress Notes (Signed)
[facility] for Coumadin Indication: atrial fibrillation  Allergies  Allergen Reactions  . Lactose Intolerance (Gi) Other (See Comments)    Stomach pain  . Milk-Related Compounds Other (See Comments)    Upset stomach   . Sulfa Antibiotics Itching   Assessment: 84 yo F on Coumadin prior to admission for atrial fibrillation who presented 3/11 with SOB x2-3 days and volume overload. INR on admission was elevated at 3.5. Pharmacy was consulted to continue Coumadin dosing inpatient.  Warfarin initially held due to elevation and INR upon restart was 2.7; now down to 1.83 today. Hgb is stable at 8.8, plts WNL. No signs/symptoms of bleeding. On home amiodarone.  PTA:  Regimen: 2mg /day except 4mg  on Mon.  Drug/Drug Interaction Potential: 1.  Warfarin + Amiodarone - inc. INR, risk of bleeding 2.  Trazadone + Amiodarone - inc. Risk for QTc prolongation  Goal of Therapy:  INR 2-3 Monitor platelets by anticoagulation protocol: Yes   Plan:  - Will give warfarin 6 mg po x1 tonight.  - Monitor daily INR/CBC - Monitor for signs/symptoms of bleeding  Rober Minion, PharmD., MS Clinical Pharmacist Pager:  [phone] Thank you for allowing pharmacy to be part of this patients care team. 06/19/2017 11:40 AM

## 2017-06-19 NOTE — Progress Notes (Signed)
Pt stated that she was not feeling like herself, she was dwelling on the past. Dr made aware.

## 2017-06-20 DIAGNOSIS — M6281 Muscle weakness (generalized): Secondary | ICD-10-CM | POA: Diagnosis not present

## 2017-06-20 DIAGNOSIS — R195 Other fecal abnormalities: Secondary | ICD-10-CM | POA: Diagnosis not present

## 2017-06-20 DIAGNOSIS — M1990 Unspecified osteoarthritis, unspecified site: Secondary | ICD-10-CM | POA: Diagnosis not present

## 2017-06-20 DIAGNOSIS — H409 Unspecified glaucoma: Secondary | ICD-10-CM | POA: Diagnosis not present

## 2017-06-20 DIAGNOSIS — I509 Heart failure, unspecified: Secondary | ICD-10-CM | POA: Diagnosis not present

## 2017-06-20 DIAGNOSIS — Z87891 Personal history of nicotine dependence: Secondary | ICD-10-CM | POA: Diagnosis not present

## 2017-06-20 DIAGNOSIS — G4709 Other insomnia: Secondary | ICD-10-CM | POA: Diagnosis not present

## 2017-06-20 DIAGNOSIS — Z79899 Other long term (current) drug therapy: Secondary | ICD-10-CM | POA: Diagnosis not present

## 2017-06-20 DIAGNOSIS — I495 Sick sinus syndrome: Secondary | ICD-10-CM | POA: Diagnosis not present

## 2017-06-20 DIAGNOSIS — E859 Amyloidosis, unspecified: Secondary | ICD-10-CM | POA: Diagnosis not present

## 2017-06-20 DIAGNOSIS — R4189 Other symptoms and signs involving cognitive functions and awareness: Secondary | ICD-10-CM | POA: Diagnosis not present

## 2017-06-20 DIAGNOSIS — E079 Disorder of thyroid, unspecified: Secondary | ICD-10-CM | POA: Diagnosis not present

## 2017-06-20 DIAGNOSIS — D631 Anemia in chronic kidney disease: Secondary | ICD-10-CM | POA: Diagnosis not present

## 2017-06-20 DIAGNOSIS — R109 Unspecified abdominal pain: Secondary | ICD-10-CM | POA: Diagnosis not present

## 2017-06-20 DIAGNOSIS — I272 Pulmonary hypertension, unspecified: Secondary | ICD-10-CM | POA: Diagnosis not present

## 2017-06-20 DIAGNOSIS — H401190 Primary open-angle glaucoma, unspecified eye, stage unspecified: Secondary | ICD-10-CM | POA: Diagnosis not present

## 2017-06-20 DIAGNOSIS — I071 Rheumatic tricuspid insufficiency: Secondary | ICD-10-CM | POA: Diagnosis not present

## 2017-06-20 DIAGNOSIS — I5033 Acute on chronic diastolic (congestive) heart failure: Secondary | ICD-10-CM | POA: Diagnosis not present

## 2017-06-20 DIAGNOSIS — R41841 Cognitive communication deficit: Secondary | ICD-10-CM | POA: Diagnosis not present

## 2017-06-20 DIAGNOSIS — R69 Illness, unspecified: Secondary | ICD-10-CM | POA: Diagnosis not present

## 2017-06-20 DIAGNOSIS — Z950 Presence of cardiac pacemaker: Secondary | ICD-10-CM | POA: Diagnosis not present

## 2017-06-20 DIAGNOSIS — N183 Chronic kidney disease, stage 3 (moderate): Secondary | ICD-10-CM | POA: Diagnosis not present

## 2017-06-20 DIAGNOSIS — I5032 Chronic diastolic (congestive) heart failure: Secondary | ICD-10-CM | POA: Diagnosis not present

## 2017-06-20 DIAGNOSIS — N189 Chronic kidney disease, unspecified: Secondary | ICD-10-CM | POA: Diagnosis not present

## 2017-06-20 DIAGNOSIS — Z170 Estrogen receptor positive status [ER+]: Secondary | ICD-10-CM | POA: Diagnosis not present

## 2017-06-20 DIAGNOSIS — Z7901 Long term (current) use of anticoagulants: Secondary | ICD-10-CM | POA: Diagnosis not present

## 2017-06-20 DIAGNOSIS — F0390 Unspecified dementia without behavioral disturbance: Secondary | ICD-10-CM | POA: Diagnosis not present

## 2017-06-20 DIAGNOSIS — R278 Other lack of coordination: Secondary | ICD-10-CM | POA: Diagnosis not present

## 2017-06-20 DIAGNOSIS — I2721 Secondary pulmonary arterial hypertension: Secondary | ICD-10-CM | POA: Diagnosis not present

## 2017-06-20 DIAGNOSIS — Z5181 Encounter for therapeutic drug level monitoring: Secondary | ICD-10-CM | POA: Diagnosis not present

## 2017-06-20 DIAGNOSIS — Z853 Personal history of malignant neoplasm of breast: Secondary | ICD-10-CM | POA: Diagnosis not present

## 2017-06-20 DIAGNOSIS — E1122 Type 2 diabetes mellitus with diabetic chronic kidney disease: Secondary | ICD-10-CM | POA: Diagnosis not present

## 2017-06-20 DIAGNOSIS — R197 Diarrhea, unspecified: Secondary | ICD-10-CM | POA: Diagnosis not present

## 2017-06-20 DIAGNOSIS — Z9223 Personal history of estrogen therapy: Secondary | ICD-10-CM | POA: Diagnosis not present

## 2017-06-20 DIAGNOSIS — I4891 Unspecified atrial fibrillation: Secondary | ICD-10-CM | POA: Diagnosis not present

## 2017-06-20 DIAGNOSIS — I5030 Unspecified diastolic (congestive) heart failure: Secondary | ICD-10-CM | POA: Diagnosis not present

## 2017-06-20 DIAGNOSIS — K219 Gastro-esophageal reflux disease without esophagitis: Secondary | ICD-10-CM | POA: Diagnosis not present

## 2017-06-20 DIAGNOSIS — I480 Paroxysmal atrial fibrillation: Secondary | ICD-10-CM | POA: Diagnosis not present

## 2017-06-20 DIAGNOSIS — Z9359 Other cystostomy status: Secondary | ICD-10-CM | POA: Diagnosis not present

## 2017-06-20 DIAGNOSIS — Z960 Presence of urogenital implants: Secondary | ICD-10-CM | POA: Diagnosis not present

## 2017-06-20 DIAGNOSIS — R498 Other voice and resonance disorders: Secondary | ICD-10-CM | POA: Diagnosis not present

## 2017-06-20 DIAGNOSIS — I130 Hypertensive heart and chronic kidney disease with heart failure and stage 1 through stage 4 chronic kidney disease, or unspecified chronic kidney disease: Secondary | ICD-10-CM | POA: Diagnosis not present

## 2017-06-20 LAB — BASIC METABOLIC PANEL
ANION GAP: 9 (ref 5–15)
BUN: 31 mg/dL — ABNORMAL HIGH (ref 6–20)
CHLORIDE: 101 mmol/L (ref 101–111)
CO2: 26 mmol/L (ref 22–32)
Calcium: 8.6 mg/dL — ABNORMAL LOW (ref 8.9–10.3)
Creatinine, Ser: 1.48 mg/dL — ABNORMAL HIGH (ref 0.44–1.00)
GFR calc Af Amer: 35 mL/min — ABNORMAL LOW (ref >60)
GFR, EST NON AFRICAN AMERICAN: 30 mL/min — AB (ref >60)
Glucose, Bld: 81 mg/dL (ref 65–99)
POTASSIUM: 4.1 mmol/L (ref 3.5–5.1)
Sodium: 136 mmol/L (ref 135–145)

## 2017-06-20 LAB — CBC
HEMATOCRIT: 30.5 % — AB (ref 36.0–46.0)
HEMOGLOBIN: 9.5 g/dL — AB (ref 12.0–15.0)
MCH: 25.8 pg — ABNORMAL LOW (ref 26.0–34.0)
MCHC: 31.1 g/dL (ref 30.0–36.0)
MCV: 82.9 fL (ref 78.0–100.0)
Platelets: 240 10*3/uL (ref 150–400)
RBC: 3.68 MIL/uL — AB (ref 3.87–5.11)
RDW: 18.2 % — ABNORMAL HIGH (ref 11.5–15.5)
WBC: 4.3 10*3/uL (ref 4.0–10.5)

## 2017-06-20 LAB — PROTIME-INR
INR: 1.93
Prothrombin Time: 21.9 seconds — ABNORMAL HIGH (ref 11.4–15.2)

## 2017-06-20 MED ORDER — DONEPEZIL HCL 5 MG PO TABS: 5.0000 mg | ORAL_TABLET | Freq: Every day | ORAL | 0 refills | Status: AC

## 2017-06-20 MED ORDER — WARFARIN SODIUM 2 MG PO TABS: 4.0000 mg | ORAL_TABLET | Freq: Once | ORAL | Status: DC

## 2017-06-20 MED ORDER — FUROSEMIDE 80 MG PO TABS: 80.0000 mg | ORAL_TABLET | Freq: Two times a day (BID) | ORAL | Status: DC

## 2017-06-20 MED ORDER — CARVEDILOL 3.125 MG PO TABS: 3.1250 mg | ORAL_TABLET | Freq: Two times a day (BID) | ORAL | 0 refills | Status: DC

## 2017-06-20 MED ORDER — SERTRALINE HCL 25 MG PO TABS: 25.0000 mg | ORAL_TABLET | Freq: Every day | ORAL | 0 refills | Status: AC

## 2017-06-20 MED ORDER — POLYETHYLENE GLYCOL 3350 17 G PO PACK: 17.0000 g | PACK | Freq: Every day | ORAL | 0 refills | Status: AC

## 2017-06-20 MED ORDER — FERROUS SULFATE 325 (65 FE) MG PO TABS: 325.0000 mg | ORAL_TABLET | ORAL | 0 refills | Status: DC

## 2017-06-20 MED ORDER — HYDROCODONE-ACETAMINOPHEN 7.5-325 MG PO TABS: 1.0000 | ORAL_TABLET | Freq: Four times a day (QID) | ORAL | 0 refills | Status: DC | PRN

## 2017-06-20 NOTE — Progress Notes (Addendum)
Family Medicine Teaching Service Daily Progress Note Intern Pager: [phone]  [name] Medical record number: [id number] Date of birth: Feb 19, 1926 Age: 84 y.o. Gender: female  Primary Care Provider: Lorene Dy, MD Consultants: HF, Palliative, PT/OT/SLP Code Status: Full   Pt Overview and Major Events to Date:  Admitted to [facility] on 06/13/17  Assessment and Plan: Natasha Fletcher a 84 y.o.femalepresenting with shortness of breath x 2-3 days. PMH is significant forHFpEF (last echo 02/2017 hyperdynamic LV function, severe LVH, g3DD, moderate RV dilation, severe TR, severe pulm HTN), anemia (baseline Hgb 9-10), sick sinus syndrome with pacemaker in place, afib on warfarin, hx of R breast cancer, T2DM (last A1c unknown), HTN, CKD 3.  Shortness of breath 2/2 CHF exacerbation. Likely 2/2 fluid overload.  This morning pt reports she feels much better.  Lung exam unremarkable and no increased work of breathing. Currently receiving  po Lasix 80 mg BID with UOP of 1500 cc.  Wt 153>150 lbs today.   -Strict I's and O's -Daily weights -continue lasix per HF recs    -HF consulted, appreciate recommendations -PT/OT - recommending SNF -ambulatory pulse ox: no home O2 need   AKIon CKDIII SCr improving from labs yesterday 1.46>1.48 today.   Baseline (Cr 1.4-1.6). Losartan discontinued prior to arrival by HF team. -Daily BMP -hold ARB -avoid nephrotoxic agents  Afib Controlled, HR 59 this AM.   Home warfarin managed by Dr. Claiborne Billings (cards). Atrial pacer in place. Hx of sick sinus syndrome. Home meds are amiodarone, coreg. -continue amiodarone -Continue to hold coreg for bradycardia -continue coumadinper pharm  Acute on chronic HFpEF Cards conducting outpatient workup for restrictive disease given severe LVH. PET scan 05/18/2017 suggestive of amyloidosis. Reds Vest Elevated -Diuresis as above -hold home coreg 2/2 bradycardia -recommended for palliative care  consultation per HF team   Pulm HTN Severe on most recent echo. Patient on opsumit and adcirca at home per cards. These were started ~05/23/17, question whether opsumit can cause some fluid retention. Negative VQ scan 05/2017  to rule out chronic PE as a cause of pulm HTN.  -continue adcirca and opsumit -appreciate HF recs  -would benefit from home O2   Anemia Stable this AM at 8.8. Baseline appears to be 9-10. Suspect ACD, although no iron studies in our system (PCP doesn't appear to be in epic).  Anemia panelobtained and wnl. Transfusion threshold of 8.0 in setting of CHF.   -monitor daily CBC   Diet-controlled T2DM: On problem list per cards. PCP not in epic. A1C 5.1.  -Continue to monitor   Suprapubic catheter Patient reports this is due to urinating too much. Unclear reason for placement as her urologist is not in epic. -RN has attached catheter to Foley bag -Patient concerned for UTI- may consider replacing  HTN Currently 137/61. Previously on losartan, but held 05/19/17.  -continue home coreg 3.125mg  BID  OA Hx of R TKR in 2015. Norco on home med list.  -CPAP PRN  Question cognitive decline Patient seems to have limited understanding of her disease state. Repeatedly asks if heart failure is "serious." No dementia meds on problem list. Does live alone. 20/30 for MOCA performed by SLP -recommended for SNF placement  -Aricept 5mg  daily  -decreased Norco   Hx of breast cancer: Patient reports hx of biopsy without treatment and yearly follow up.   Abnormal speech: Patient stutters, per chart review appears to be chronic.  -no new neurological workup  Visual Hallucinations: During admission with visual hallucinations,  concerning for sundowning. No complaints this AM.   -will continue to monitor -encourage re-orientation   FEN/GI:heart healthy diet Prophylaxis:home warfarin   Disposition: Awaiting SNF placement   Subjective:  Patient sitting up in chair  and is happy at the fact she may go to rehab. No complaints.   Objective: Temp:  [98.1 F (36.7 C)-98.2 F (36.8 C)] 98.1 F (36.7 C) (03/18 0226) Pulse Rate:  [60-67] 60 (03/18 0226) Resp:  [18-20] 20 (03/18 0226) BP: (108-155)/(52-61) 137/61 (03/18 0226) SpO2:  [93 %-96 %] 94 % (03/18 0226) Weight:  [150 lb 2.1 oz (68.1 kg)] 150 lb 2.1 oz (68.1 kg) (03/18 0535)   Physical Exam: General: NAD, pleasant Eyes: PERRL, EOMI, no conjunctival pallor or injection ENTM: Moist mucous membranes Cardiovascular: RRR, no m/r/g, no LE edema Respiratory: CTA BL, normal work of breathing Gastrointestinal: soft, nontender, nondistended, normoactive BS MSK: moves 4 extremities equally Derm: no rashes appreciated Neuro: CN II-XII grossly intact Psych: AOx3, appropriate affect  Laboratory: Recent Labs  Lab 06/18/17 0554 06/19/17 0704 06/20/17 0513  WBC 4.2 3.8* 4.3  HGB 9.0* 8.8* 9.5*  HCT 29.4* 28.1* 30.5*  PLT 231 223 240   Recent Labs  Lab 06/13/17 1144  06/18/17 0554 06/19/17 0704 06/20/17 0513  NA  --    < > 137 137 136  K  --    < > 4.2 4.4 4.1  CL  --    < > 103 106 101  CO2  --    < > 24 21* 26  BUN  --    < > 31* 30* 31*  CREATININE  --    < > 1.46* 1.38* 1.48*  CALCIUM  --    < > 8.6* 8.7* 8.6*  PROT 7.5  --   --   --   --   BILITOT 1.2  --   --   --   --   ALKPHOS 88  --   --   --   --   ALT 13*  --   --   --   --   AST 24  --   --   --   --   GLUCOSE  --    < > 86 82 81   < > = values in this interval not displayed.    Imaging/Diagnostic Tests: Dg Chest 2 View  Result Date: 06/13/2017 CLINICAL DATA:  Shortness of breath and chest pain for 3-4 days. EXAM: CHEST - 2 VIEW COMPARISON:  05/06/2017 FINDINGS: Diffuse interstitial opacity with small pleural effusions. Chronic cardiomegaly. Stable positioning of dual-chamber pacer leads from the left. Vascular pedicle widening and aortic tortuosity. IMPRESSION: Diffuse lung opacity and small pleural effusions favoring CHF.  Electronically Signed   By: Monte Fantasia M.D.   On: 06/13/2017 10:06     Jalissa Heinzelman, [place], DO 06/20/2017, 7:41 AM PGY-1, [place] Intern pager: [phone], text pages welcome

## 2017-06-20 NOTE — Clinical Social Work Note (Addendum)
SNF admissions coordinator will check status of insurance authorization after their morning meeting.  Natasha Fletcher, [place]  11:38 am Authorization approved. MD notified.  Natasha Fletcher, [place] [phone]  2:07 pm Patient stated she remembers having visual hallucinations when she first arrived to the hospital and knows they were not real. Patient wanted this documented to prevent any false accusations.  Natasha Fletcher, [place]
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
## 2017-06-20 NOTE — Telephone Encounter (Signed)
REFILL 

## 2017-06-20 NOTE — Care Management Important Message (Signed)
Important Message  Patient Details  Name: Natasha Fletcher MRN: [id number] Date of Birth: 06-13-1925   Medicare Important Message Given:  Yes    Vinny Taranto P Linzy Laury 06/20/2017, 1:45 PM

## 2017-06-20 NOTE — Clinical Social Work Note (Signed)
CSW facilitated patient discharge including contacting patient family (Left voicemail for brother, [name], at patient's request) and facility to confirm patient discharge plans. Clinical information faxed to facility and family agreeable with plan. CSW arranged ambulance transport via [facility] to [facility] at 2:45 pm. RN to call report prior to discharge [phone] Room [phone]).  CSW will sign off for now as social work intervention is no longer needed. Please consult [place] again if new needs arise.  Dayton Scrape, [place]

## 2017-06-20 NOTE — Progress Notes (Signed)
Pt is alert and oriented with forgetfulness, patient has frequent request, with Schedules and PRNs given for discomfort in legs radiating down to ankles. No distress. Vitals stable

## 2017-06-20 NOTE — Clinical Social Work Placement (Signed)
   CLINICAL SOCIAL WORK PLACEMENT  NOTE  Date:  06/20/2017  Patient Details  Name: Natasha Fletcher MRN: [id number] Date of Birth: 15-Jun-1925  Clinical Social Work is seeking post-discharge placement for this patient at the [place] level of care (*CSW will initial, date and re-position this form in  chart as items are completed):  Yes   Patient/family provided with [place] Work Department's list of facilities offering this level of care within the geographic area requested by the patient (or if unable, by the patient's family).  Yes   Patient/family informed of their freedom to choose among providers that offer the needed level of care, that participate in Medicare, Medicaid or managed care program needed by the patient, have an available bed and are willing to accept the patient.  Yes   Patient/family informed of [place]'s ownership interest in [facility] and [facility], as well as of the fact that they are under no obligation to receive care at these facilities.  PASRR submitted to EDS on 06/16/17     PASRR number received on       Existing PASRR number confirmed on 06/16/17     FL2 transmitted to all facilities in geographic area requested by pt/family on 06/16/17     FL2 transmitted to all facilities within larger geographic area on       Patient informed that his/her managed care company has contracts with or will negotiate with certain facilities, including the following:        Yes   Patient/family informed of bed offers received.  Patient chooses bed at [facility]     Physician recommends and patient chooses bed at      Patient to be transferred to [facility] on 06/20/17.  Patient to be transferred to facility by [facility]     Patient family notified on 06/20/17 of transfer.  Name of family member notified:  Left voicemail for brother, [name].     PHYSICIAN Please prepare prescriptions     Additional Comment:     _______________________________________________ Candie Chroman, LCSW 06/20/2017, 1:56 PM

## 2017-06-20 NOTE — Progress Notes (Signed)
[place] for Coumadin Indication: atrial fibrillation  Allergies  Allergen Reactions  . Lactose Intolerance (Gi) Other (See Comments)    Stomach pain  . Milk-Related Compounds Other (See Comments)    Upset stomach   . Sulfa Antibiotics Itching   Assessment: 84 yo F on Coumadin prior to admission for atrial fibrillation who presented 3/11 with SOB x2-3 days and volume overload. INR on admission was elevated at 3.5. Pharmacy was consulted to continue Coumadin dosing inpatient. PTA:  Regimen: 2mg /day except 4mg  on Mon.  Due to elevated INR, Coumadin has been held x2 days (last dose was on 3/10). INR upon restart was 2.7; now down to 1.82 today. Hgb is stable at 9.0, plts WNL. No signs/symptoms of bleeding. On home amiodarone.   Drug/Drug Interaction Potential: 1.  Warfarin + Amiodarone - inc. INR, risk of bleeding 2.  Trazadone + Amiodarone - inc. Risk for QTc prolongation  Goal of Therapy:  INR 2-3 Monitor platelets by anticoagulation protocol: Yes   Plan:  Order warfarin 4 mg po x1 tonight. Suspect INR will continue to rise tomorrow given larger doses of Coumadin past 2 days. Monitor daily INR/CBC Monitor for signs/symptoms of bleeding  Uvaldo Rising, BCPS  Clinical Pharmacist Pager [phone]  06/20/2017 2:03 PM

## 2017-06-20 NOTE — Progress Notes (Signed)
Up in chair and medicated wit prns

## 2017-06-20 NOTE — Progress Notes (Signed)
Patient is awake restless, worried about not being about to sleep. Gave trazodone narco. With no relief.

## 2017-06-21 DIAGNOSIS — R4189 Other symptoms and signs involving cognitive functions and awareness: Secondary | ICD-10-CM | POA: Diagnosis not present

## 2017-06-21 DIAGNOSIS — Z5181 Encounter for therapeutic drug level monitoring: Secondary | ICD-10-CM | POA: Diagnosis not present

## 2017-06-21 DIAGNOSIS — I4891 Unspecified atrial fibrillation: Secondary | ICD-10-CM | POA: Diagnosis not present

## 2017-06-21 DIAGNOSIS — I5030 Unspecified diastolic (congestive) heart failure: Secondary | ICD-10-CM | POA: Diagnosis not present

## 2017-06-22 DIAGNOSIS — G4709 Other insomnia: Secondary | ICD-10-CM | POA: Diagnosis not present

## 2017-06-22 DIAGNOSIS — R109 Unspecified abdominal pain: Secondary | ICD-10-CM | POA: Diagnosis not present

## 2017-06-22 DIAGNOSIS — I5030 Unspecified diastolic (congestive) heart failure: Secondary | ICD-10-CM | POA: Diagnosis not present

## 2017-06-22 DIAGNOSIS — Z960 Presence of urogenital implants: Secondary | ICD-10-CM | POA: Diagnosis not present

## 2017-06-24 DIAGNOSIS — I5030 Unspecified diastolic (congestive) heart failure: Secondary | ICD-10-CM | POA: Diagnosis not present

## 2017-06-24 DIAGNOSIS — Z960 Presence of urogenital implants: Secondary | ICD-10-CM | POA: Diagnosis not present

## 2017-06-24 DIAGNOSIS — H401190 Primary open-angle glaucoma, unspecified eye, stage unspecified: Secondary | ICD-10-CM | POA: Diagnosis not present

## 2017-06-27 DIAGNOSIS — I5030 Unspecified diastolic (congestive) heart failure: Secondary | ICD-10-CM | POA: Diagnosis not present

## 2017-06-27 DIAGNOSIS — Z9359 Other cystostomy status: Secondary | ICD-10-CM | POA: Diagnosis not present

## 2017-06-28 ENCOUNTER — Encounter: Payer: Self-pay | Admitting: Adult Health

## 2017-06-28 ENCOUNTER — Inpatient Hospital Stay: Payer: Medicare HMO | Attending: Adult Health | Admitting: Adult Health

## 2017-06-28 VITALS — BP 117/65 | HR 61 | Temp 97.6°F | Resp 17 | Ht 64.0 in | Wt 152.8 lb

## 2017-06-28 DIAGNOSIS — N189 Chronic kidney disease, unspecified: Secondary | ICD-10-CM | POA: Diagnosis not present

## 2017-06-28 DIAGNOSIS — C50211 Malignant neoplasm of upper-inner quadrant of right female breast: Secondary | ICD-10-CM

## 2017-06-28 DIAGNOSIS — Z7901 Long term (current) use of anticoagulants: Secondary | ICD-10-CM | POA: Diagnosis not present

## 2017-06-28 DIAGNOSIS — Z853 Personal history of malignant neoplasm of breast: Secondary | ICD-10-CM | POA: Diagnosis not present

## 2017-06-28 DIAGNOSIS — Z79899 Other long term (current) drug therapy: Secondary | ICD-10-CM | POA: Diagnosis not present

## 2017-06-28 DIAGNOSIS — E079 Disorder of thyroid, unspecified: Secondary | ICD-10-CM

## 2017-06-28 DIAGNOSIS — I130 Hypertensive heart and chronic kidney disease with heart failure and stage 1 through stage 4 chronic kidney disease, or unspecified chronic kidney disease: Secondary | ICD-10-CM | POA: Diagnosis not present

## 2017-06-28 DIAGNOSIS — M1990 Unspecified osteoarthritis, unspecified site: Secondary | ICD-10-CM | POA: Diagnosis not present

## 2017-06-28 DIAGNOSIS — H401190 Primary open-angle glaucoma, unspecified eye, stage unspecified: Secondary | ICD-10-CM | POA: Diagnosis not present

## 2017-06-28 DIAGNOSIS — Z170 Estrogen receptor positive status [ER+]: Secondary | ICD-10-CM | POA: Diagnosis not present

## 2017-06-28 DIAGNOSIS — H409 Unspecified glaucoma: Secondary | ICD-10-CM | POA: Insufficient documentation

## 2017-06-28 DIAGNOSIS — I5030 Unspecified diastolic (congestive) heart failure: Secondary | ICD-10-CM | POA: Diagnosis not present

## 2017-06-28 DIAGNOSIS — Z9223 Personal history of estrogen therapy: Secondary | ICD-10-CM | POA: Diagnosis not present

## 2017-06-28 DIAGNOSIS — K219 Gastro-esophageal reflux disease without esophagitis: Secondary | ICD-10-CM | POA: Diagnosis not present

## 2017-06-28 DIAGNOSIS — I2721 Secondary pulmonary arterial hypertension: Secondary | ICD-10-CM | POA: Diagnosis not present

## 2017-06-28 DIAGNOSIS — E1122 Type 2 diabetes mellitus with diabetic chronic kidney disease: Secondary | ICD-10-CM | POA: Diagnosis not present

## 2017-06-28 NOTE — Progress Notes (Signed)
CLINIC:  Survivorship   REASON FOR VISIT:  Routine follow-up for history of breast cancer.   BRIEF ONCOLOGIC HISTORY:    Breast cancer of upper-inner quadrant of right female breast ([place])   03/24/2011 Initial Diagnosis    Right breast biopsy: Invasive ductal carcinoma, ER 100%, P 100%, Ki-67 8%, HER-2 negative ratio 1.42      04/27/2011 Surgery    Right breast lumpectomy: 0.9 cm invasive ductal carcinoma grade 1 with low-grade DCIS ER/PR positive HER-2 negative      05/12/2011 - 06/04/2015 Anti-estrogen oral therapy    Arimidex 1 mg daily        INTERVAL HISTORY:  Ms. Adderly presents to the [facility] today for routine follow-up for her history of breast cancer.  Overall, she reports feeling quite well. Avie was recently hospitalized for multiple issues including chronic kidney disease, heart failure, questionable cognitive decline to name a few (full d/c summary in epic).  She was discharged on 06/20/17 to [facility].  She is improving since then.  Karinna has not undergone a mammogram since 2017.  She says she doesn't want a mammogram and she doesn't want a breast exam.  She also tells me that she wasn't aware that she had breast cancer.    REVIEW OF SYSTEMS:  Review of Systems  Constitutional: Negative for appetite change, chills, fatigue, fever and unexpected weight change.  HENT:   Negative for hearing loss, lump/mass and trouble swallowing.   Eyes: Negative for eye problems and icterus.  Respiratory: Negative for chest tightness, cough and shortness of breath.   Cardiovascular: Negative for chest pain, leg swelling and palpitations.  Gastrointestinal: Negative for abdominal distention and abdominal pain.  Endocrine: Negative for hot flashes.  Skin: Negative for itching and rash.  Neurological: Negative for dizziness, extremity weakness and headaches.  Hematological: Negative for adenopathy. Does not bruise/bleed easily.  Psychiatric/Behavioral: Negative for  depression. The patient is not nervous/anxious.    Breast: Denies any new nodularity, masses, tenderness, nipple changes, or nipple discharge.       PAST MEDICAL/SURGICAL HISTORY:  Past Medical History:  Diagnosis Date  . Arthritis    "all over"  . Asthma   . Atrial fibrillation ([place])    a. s/p DCCV in 01/2014 b. repeat DCCV in 06/2015, started on Amiodarone at that time. On Coumadin for anticoagulation.  . Cancer ([place])    rt breast  . Chronic diastolic CHF (congestive heart failure) ([place])    a. 08/2015: Echo with EF of 60-65%, no WMA, LA mod dilated, mild TR.  . Diabetes mellitus without complication ([place])   . Edema leg   . Fast breathing    "because of pill"  . GERD (gastroesophageal reflux disease)   . Glaucoma   . Glaucoma   . Hypertension   . Incontinence   . Pneumonia    h/o younger  . S/P total knee arthroplasty   . SSS (sick sinus syndrome) ([place])    a. s/p Medtronic Advisa DR MRI [id number] (serial number [id number]) PPM placement in 08/2015  . Swelling    bilateral feet/ legs, more left.  . Thyroid disease    "something years ago"  . UTI (urinary tract infection) 02/12/2015  . Wears dentures   . Wears glasses    Past Surgical History:  Procedure Laterality Date  . ABDOMINAL HYSTERECTOMY    . APPENDECTOMY    . BREAST BIOPSY  04/27/2011   Procedure: BREAST BIOPSY WITH NEEDLE LOCALIZATION;  Surgeon: Edward Jolly, MD;  Location: [facility];  Service: General;  Laterality: Right;  right needle localized breast lumpectomy   . BREAST LUMPECTOMY  04/27/11   right breast by [facility]  . CARDIOVERSION N/A 02/01/2014   Procedure: CARDIOVERSION;  Surgeon: Candee Furbish, MD;  Location: [facility] ENDOSCOPY;  Service: Cardiovascular;  Laterality: N/A;  . CARDIOVERSION N/A 06/04/2015   Procedure: CARDIOVERSION;  Surgeon: Dorothy Spark, MD;  Location: [facility];  Service: Cardiovascular;  Laterality: N/A;  . CARDIOVERSION N/A 07/14/2015   Procedure:  CARDIOVERSION;  Surgeon: Thayer Headings, MD;  Location: [facility] ENDOSCOPY;  Service: Cardiovascular;  Laterality: N/A;  . CARPAL TUNNEL RELEASE Bilateral   . COLONOSCOPY    . EP IMPLANTABLE DEVICE N/A 08/22/2015   Procedure: Pacemaker Implant;  Surgeon: Will Meredith Leeds, MD;  Medtronic Advisa DR MRI [id number] (serial number [id number]);  Laterality: N/A;  . EYE SURGERY     both cataracts  . RIGHT HEART CATH N/A 04/21/2017   Procedure: RIGHT HEART CATH;  Surgeon: Larey Dresser, MD;  Location: [facility] CV LAB;  Service: Cardiovascular;  Laterality: N/A;  . SHOULDER SURGERY Left   . TEE WITHOUT CARDIOVERSION N/A 02/01/2014   Procedure: TRANSESOPHAGEAL ECHOCARDIOGRAM (TEE);  Surgeon: Candee Furbish, MD;  Location[facility] ENDOSCOPY;  Service: Cardiovascular;  Laterality: N/A;  . TONSILLECTOMY    . TOTAL HIP ARTHROPLASTY Bilateral   . TOTAL KNEE ARTHROPLASTY Right 02/19/2013   Procedure: RIGHT TOTAL KNEE ARTHROPLASTY;  Surgeon: Gearlean Alf, MD;  Location: [facility] ORS;  Service: Orthopedics;  Laterality: Right;  . TOTAL KNEE ARTHROPLASTY Left 10/15/2013   Procedure: LEFT TOTAL KNEE ARTHROPLASTY;  Surgeon: Gearlean Alf, MD;  Location: [facility] ORS;  Service: Orthopedics;  Laterality: Left;     ALLERGIES:  Allergies  Allergen Reactions  . Lactose Intolerance (Gi) Other (See Comments)    Stomach pain  . Milk-Related Compounds Other (See Comments)    Upset stomach   . Sulfa Antibiotics Itching     CURRENT MEDICATIONS:  Outpatient Encounter Medications as of 06/28/2017  Medication Sig  . amiodarone (PACERONE) 200 MG tablet Take 0.5 tablets (100 mg total) by mouth daily.  . carvedilol (COREG) 3.125 MG tablet Take 1 tablet (3.125 mg total) by mouth 2 (two) times daily with a meal.  . donepezil (ARICEPT) 5 MG tablet Take 1 tablet (5 mg total) by mouth at bedtime.  . ferrous sulfate 325 (65 FE) MG tablet Take 1 tablet (325 mg total) by mouth every other day.  . furosemide (LASIX) 80 MG tablet Take 1  tablet (80 mg total) by mouth 2 (two) times daily.  Marland Kitchen HYDROcodone-acetaminophen (NORCO) 7.5-325 MG tablet Take 1 tablet by mouth every 6 (six) hours as needed for moderate pain.  Marland Kitchen latanoprost (XALATAN) 0.005 % ophthalmic solution Place 1 drop into both eyes at bedtime.  . Macitentan (OPSUMIT) 10 MG TABS Take 1 tablet (10 mg total) by mouth daily.  . polyethylene glycol (MIRALAX / GLYCOLAX) packet Take 17 g by mouth daily.  . sertraline (ZOLOFT) 25 MG tablet Take 1 tablet (25 mg total) by mouth daily.  . tadalafil, PAH, (ADCIRCA) 20 MG tablet Take 1 tablet (20 mg total) by mouth daily.  . traZODone (DESYREL) 50 MG tablet Take 50 mg by mouth at bedtime.  Marland Kitchen warfarin (COUMADIN) 4 MG tablet TAKE 1/2 TO 1 TABLET BY MOUTH DAILY AS DIRECTED BY COUMADIN CLINIC (Patient taking differently: TAKE (2 MG) BY MOUTH DAILY)  .  warfarin (COUMADIN) 4 MG tablet TAKE 1/2 TO 1 TABLET BY MOUTH DAILY AS DIRECTED BY COUMADIN CLINIC (Patient not taking: Reported on 06/28/2017)   No facility-administered encounter medications on file as of 06/28/2017.      ONCOLOGIC FAMILY HISTORY:  Family History  Problem Relation Age of Onset  . Heart disease Mother   . Stroke Mother   . Hypertension Mother   . Stroke Father   . Hypertension Father   . Stroke Brother   . Heart attack Neg Hx      SOCIAL HISTORY:  Tykera B Ehler is single and lives in [facility].  Ms. Rapaport is currently retired.  She denies any current or history of tobacco, alcohol, or illicit drug use.     PHYSICAL EXAMINATION:  Vital Signs: Vitals:   06/28/17 1110  BP: 117/65  Pulse: 61  Resp: 17  Temp: 97.6 F (36.4 C)  SpO2: 100%   Filed Weights   06/28/17 1110  Weight: 152 lb 12.8 oz (69.3 kg)   General: Well appearing older woman examined in wheelchair HEENT: Head is normocephalic.  Pupils equal and reactive to light. Conjunctivae clear without exudate.  Sclerae anicteric. Oral mucosa is pink, moist.  Oropharynx is pink  without lesions or erythema.  Lymph: No cervical, supraclavicular, or infraclavicular lymphadenopathy noted on palpation.  Cardiovascular: Regular rate and rhythm.Marland Kitchen Respiratory: Clear to auscultation bilaterally. Chest expansion symmetric; breathing non-labored.  Breast Exam:  Declined Breast Exam Axilla: No axillary adenopathy bilaterally.  GI: Abdomen soft and round; non-tender, non-distended. Bowel sounds normoactive. No hepatosplenomegaly.   GU: Deferred.  Neuro: No focal deficits. Steady gait.  Psych: Mood and affect normal and appropriate for situation.  MSK: No focal spinal tenderness to palpation, full range of motion in bilateral upper extremities Extremities: No edema. Skin: Warm and dry.  LABORATORY DATA:  None for this visit   DIAGNOSTIC IMAGING:  Most recent mammogram:     ASSESSMENT AND PLAN:  Ms.. Starke is a pleasant 84 y.o. female with history of Stage IA right breast invasive ductal carcinoma, ER+/PR+/HER2-, diagnosed in 03/2011, treated with lumpectomy, and anti-estrogen therapy with Anastrozole x 4 years completed in 06/2015.  She [facility] to the [facility] for surveillance and routine follow-up.   1. History of breast cancer:  Ms. Arrambide is currently clinically without evidence of disease or recurrence of breast cancer. She declines mammogram and breast exam.  I reviewed with her that due to her age this was okay, and certainly her choice.  I did review the risk involved with foregoing the mammogram, and that the cancer could come back and we wouldn't know it until later.  She verbalized understanding of the risks.  I reviewed with her that since she has decided to forego breast exams and mammograms, and since she is almost 7 years out from diagnosis, that she can come back and see [place] on an as needed basis.  I reviewed this with her several times and she agrees.    2. Mentation: I had to review everything multiple times with the patient today.  I question her  cognition.  I attempted to call her brother and let him know what was going on since he is her emergency contact.  It appears that there were cognitive issues during her hospitalization and she was placed on Aricept.    3. Cancer screening:  Due to Ms. Benkert's history and her age, she should receive screening for skin cancers. She was encouraged to  follow-up with her PCP for appropriate cancer screenings.   4. Health maintenance and wellness promotion: Ms. Mamaril was encouraged to consume 5-7 servings of fruits and vegetables per day. She was also encouraged to stay active.    Dispo:  -Return to cancer center PRN   A total of (30) minutes of face-to-face time was spent with this patient with greater than 50% of that time in counseling and care-coordination.   Gardenia Phlegm, NP Survivorship Program [place] [phone]   Note: PRIMARY CARE PROVIDER Lorene Dy, [place] [phone]

## 2017-06-29 ENCOUNTER — Telehealth: Payer: Self-pay | Admitting: Adult Health

## 2017-06-29 DIAGNOSIS — I5030 Unspecified diastolic (congestive) heart failure: Secondary | ICD-10-CM | POA: Diagnosis not present

## 2017-06-29 DIAGNOSIS — I2721 Secondary pulmonary arterial hypertension: Secondary | ICD-10-CM | POA: Diagnosis not present

## 2017-06-29 DIAGNOSIS — R195 Other fecal abnormalities: Secondary | ICD-10-CM | POA: Diagnosis not present

## 2017-06-29 NOTE — Telephone Encounter (Signed)
Per 3/26 no los 

## 2017-06-30 DIAGNOSIS — I5030 Unspecified diastolic (congestive) heart failure: Secondary | ICD-10-CM | POA: Diagnosis not present

## 2017-06-30 DIAGNOSIS — D631 Anemia in chronic kidney disease: Secondary | ICD-10-CM | POA: Diagnosis not present

## 2017-06-30 DIAGNOSIS — N189 Chronic kidney disease, unspecified: Secondary | ICD-10-CM | POA: Diagnosis not present

## 2017-06-30 DIAGNOSIS — R197 Diarrhea, unspecified: Secondary | ICD-10-CM | POA: Diagnosis not present

## 2017-07-01 ENCOUNTER — Other Ambulatory Visit: Payer: Self-pay

## 2017-07-01 ENCOUNTER — Encounter (HOSPITAL_COMMUNITY): Payer: Self-pay | Admitting: Cardiology

## 2017-07-01 ENCOUNTER — Other Ambulatory Visit (HOSPITAL_COMMUNITY): Payer: Self-pay | Admitting: *Deleted

## 2017-07-01 ENCOUNTER — Ambulatory Visit (HOSPITAL_COMMUNITY)
Admission: RE | Admit: 2017-07-01 | Discharge: 2017-07-01 | Disposition: A | Discharge status: Home / Self Care | Payer: Medicare HMO | Source: Ambulatory Visit | Attending: Cardiology | Admitting: Cardiology

## 2017-07-01 VITALS — BP 113/56 | HR 64 | Wt 150.5 lb

## 2017-07-01 DIAGNOSIS — I071 Rheumatic tricuspid insufficiency: Secondary | ICD-10-CM | POA: Diagnosis not present

## 2017-07-01 DIAGNOSIS — I130 Hypertensive heart and chronic kidney disease with heart failure and stage 1 through stage 4 chronic kidney disease, or unspecified chronic kidney disease: Secondary | ICD-10-CM | POA: Insufficient documentation

## 2017-07-01 DIAGNOSIS — E859 Amyloidosis, unspecified: Secondary | ICD-10-CM

## 2017-07-01 DIAGNOSIS — I495 Sick sinus syndrome: Secondary | ICD-10-CM | POA: Diagnosis not present

## 2017-07-01 DIAGNOSIS — N183 Chronic kidney disease, stage 3 (moderate): Secondary | ICD-10-CM | POA: Diagnosis not present

## 2017-07-01 DIAGNOSIS — Z7901 Long term (current) use of anticoagulants: Secondary | ICD-10-CM | POA: Diagnosis not present

## 2017-07-01 DIAGNOSIS — Z79899 Other long term (current) drug therapy: Secondary | ICD-10-CM | POA: Insufficient documentation

## 2017-07-01 DIAGNOSIS — I272 Pulmonary hypertension, unspecified: Secondary | ICD-10-CM

## 2017-07-01 DIAGNOSIS — I2721 Secondary pulmonary arterial hypertension: Secondary | ICD-10-CM | POA: Diagnosis not present

## 2017-07-01 DIAGNOSIS — Z87891 Personal history of nicotine dependence: Secondary | ICD-10-CM | POA: Insufficient documentation

## 2017-07-01 DIAGNOSIS — I480 Paroxysmal atrial fibrillation: Secondary | ICD-10-CM | POA: Diagnosis not present

## 2017-07-01 DIAGNOSIS — I5032 Chronic diastolic (congestive) heart failure: Secondary | ICD-10-CM | POA: Insufficient documentation

## 2017-07-01 DIAGNOSIS — Z853 Personal history of malignant neoplasm of breast: Secondary | ICD-10-CM | POA: Insufficient documentation

## 2017-07-01 DIAGNOSIS — Z950 Presence of cardiac pacemaker: Secondary | ICD-10-CM | POA: Diagnosis not present

## 2017-07-01 DIAGNOSIS — E1122 Type 2 diabetes mellitus with diabetic chronic kidney disease: Secondary | ICD-10-CM | POA: Insufficient documentation

## 2017-07-01 DIAGNOSIS — F0390 Unspecified dementia without behavioral disturbance: Secondary | ICD-10-CM | POA: Insufficient documentation

## 2017-07-01 LAB — COMPREHENSIVE METABOLIC PANEL
ALK PHOS: 90 U/L (ref 38–126)
ALT: 13 U/L — AB (ref 14–54)
AST: 31 U/L (ref 15–41)
Albumin: 3.6 g/dL (ref 3.5–5.0)
Anion gap: 12 (ref 5–15)
BUN: 37 mg/dL — AB (ref 6–20)
CALCIUM: 9.1 mg/dL (ref 8.9–10.3)
CO2: 25 mmol/L (ref 22–32)
CREATININE: 1.87 mg/dL — AB (ref 0.44–1.00)
Chloride: 101 mmol/L (ref 101–111)
GFR calc Af Amer: 26 mL/min — ABNORMAL LOW (ref >60)
GFR, EST NON AFRICAN AMERICAN: 23 mL/min — AB (ref >60)
Glucose, Bld: 90 mg/dL (ref 65–99)
Potassium: 3.5 mmol/L (ref 3.5–5.1)
SODIUM: 138 mmol/L (ref 135–145)
Total Bilirubin: 1 mg/dL (ref 0.3–1.2)
Total Protein: 6.9 g/dL (ref 6.5–8.1)

## 2017-07-01 LAB — TSH: TSH: 0.854 u[IU]/mL (ref 0.350–4.500)

## 2017-07-01 MED ORDER — FUROSEMIDE 80 MG PO TABS: ORAL_TABLET | ORAL | Status: DC

## 2017-07-03 NOTE — Progress Notes (Signed)
PCP: Dr. Mancel Bale Cardiology: Dr. Claiborne Billings HF Cardiology: Dr. Aundra Dubin  84 yo with history of CKD stage 3, SSS with Medtronic PPM, paroxysmal atrial fibrillation, and chronic diastolic CHF was referred by Dr. Claiborne Billings for evaluation of CHF/pulmonary hypertension. She has had noticeable exertional dyspnea for over a year. It has worsened gradually. Currently, she is short of breath with most ADLs.  Most recent echo in 11/18 showed normal EF with severe LVH, restrictive diastolic function, moderately decreased RV systolic function, and severe pulmonary hypertension.  RHC in 1/19 showed severe PAH and mildly elevated PCWP. V/Q scan showed no chronic PEs and PFTs showed minimal obstructive/restriction.   She was admitted in 3/19 for CHF exacerbation and treated with diuresis.  Weight is down 7 lbs today.  She is now in a SNF for rehab.   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   She returns for followup of CHF and pulmonary hypertension. She is starting to walk some now with a walker with PT.  She is short of breath walking < 100 feet. No orthopnea/PND. No chest pain.  No lightheadedness or falls.   ECG (personally reviewed, 3/19): NSR, LVH  Labs (9/18): K 4.3, creatinine 1.68 Labs (11/18): TSH normal, LFTs normal, NT-proBNP 4311, hgb 9.7 Labs (1/19): K 4.4, creatinine 1.58, hgb 10 Labs (3/19): K 4.1, creatinine 1.48, hgb 9.5, urine immunofixation negative, ANA negative, anti-Jo1 antibody negative, anti-SCL70 negative, SPEP with faint M-spike, RF negative.   PMH: 1. CKD stage 3 2. Right breast cancer.  3. Atrial fibrillation: Paroxysmal.  H/o DCCV.  On amiodarone to maintain NSR.   4. Right TKR 2015 5. Sick sinus syndrome: Medtronic PPM 5/17.  6. Type II diabetes 7. HTN 8. Chronic diastolic CHF: Echo (53/61) with EF 65-70%, severe LVH, restrictive diastolic function, mild MR, mild AI, moderately dilated RV with moderately decreased systolic function, PASP 79 mmHg, severe TR.  - TcPYP scan  (2/19): Suggestive of TTR amyloidosis.  9. Pulmonary hypertension:  - RHC (1/19): mean RA 8, PA 72/22 mean 41, mean PCWP 20, CI 2.22, PVR 5.4 WU. - PFTs (2/19): minimal obstruction, mild restriction, severely decreased DLCO.  - V/Q scan (2/19): No chronic or acute PE. 10. Severe TR 11. Dementia  Social History   Socioeconomic History  . Marital status: Single    Spouse name: Not on file  . Number of children: Not on file  . Years of education: Not on file  . Highest education level: Not on file  Occupational History  . Not on file  Social Needs  . Financial resource strain: Not on file  . Food insecurity:    Worry: Not on file    Inability: Not on file  . Transportation needs:    Medical: Not on file    Non-medical: Not on file  Tobacco Use  . Smoking status: Former Smoker    Packs/day: 0.00    Years: 30.00    Pack years: 0.00    Types: Cigarettes    Last attempt to quit: 04/21/1975    Years since quitting: 42.2  . Smokeless tobacco: Never Used  Substance and Sexual Activity  . Alcohol use: Yes    Comment: occasionally  . Drug use: No  . Sexual activity: Not Currently  Lifestyle  . Physical activity:    Days per week: Not on file    Minutes per session: Not on file  . Stress: Not on file  Relationships  . Social connections:    Talks on phone:  Not on file    Gets together: Not on file    Attends religious service: Not on file    Active member of club or organization: Not on file    Attends meetings of clubs or organizations: Not on file    Relationship status: Not on file  . Intimate partner violence:    Fear of current or ex partner: Not on file    Emotionally abused: Not on file    Physically abused: Not on file    Forced sexual activity: Not on file  Other Topics Concern  . Not on file  Social History Narrative   From SNF, getting PT and using cane.     Family History  Problem Relation Age of Onset  . Heart disease Mother   . Stroke Mother   .  Hypertension Mother   . Stroke Father   . Hypertension Father   . Stroke Brother   . Heart attack Neg Hx    ROS: All systems reviewed and negative except as per HPI.   Current Outpatient Medications  Medication Sig Dispense Refill  . amiodarone (PACERONE) 200 MG tablet Take 0.5 tablets (100 mg total) by mouth daily. 45 tablet 2  . carvedilol (COREG) 3.125 MG tablet Take 1 tablet (3.125 mg total) by mouth 2 (two) times daily with a meal. 30 tablet 0  . donepezil (ARICEPT) 5 MG tablet Take 1 tablet (5 mg total) by mouth at bedtime. 30 tablet 0  . ferrous sulfate 325 (65 FE) MG tablet Take 1 tablet (325 mg total) by mouth every other day. 30 tablet 0  . HYDROcodone-acetaminophen (NORCO) 7.5-325 MG tablet Take 1 tablet by mouth every 6 (six) hours as needed for moderate pain. 30 tablet 0  . latanoprost (XALATAN) 0.005 % ophthalmic solution Place 1 drop into both eyes at bedtime.    . Macitentan (OPSUMIT) 10 MG TABS Take 1 tablet (10 mg total) by mouth daily. 30 tablet 11  . polyethylene glycol (MIRALAX / GLYCOLAX) packet Take 17 g by mouth daily. 14 each 0  . sertraline (ZOLOFT) 25 MG tablet Take 1 tablet (25 mg total) by mouth daily. 30 tablet 0  . tadalafil, PAH, (ADCIRCA) 20 MG tablet Take 1 tablet (20 mg total) by mouth daily. 30 tablet 11  . traZODone (DESYREL) 50 MG tablet Take 50 mg by mouth at bedtime.  2  . warfarin (COUMADIN) 4 MG tablet TAKE 1/2 TO 1 TABLET BY MOUTH DAILY AS DIRECTED BY COUMADIN CLINIC 30 tablet 2  . furosemide (LASIX) 80 MG tablet Take 80mg  in the AM and 40mg  in the PM 30 tablet    No current facility-administered medications for this encounter.    BP (!) 113/56   Pulse 64   Wt 150 lb 8 oz (68.3 kg)   SpO2 98%   BMI 25.83 kg/m  General: NAD Neck: No JVD, no thyromegaly or thyroid nodule.  Lungs: Clear to auscultation bilaterally with normal respiratory effort. CV: Nondisplaced PMI.  Heart regular S1/S2, no S3/S4, 2/6 HSM LLSB.  No peripheral edema.  No  carotid bruit.  Normal pedal pulses.  Abdomen: Soft, nontender, no hepatosplenomegaly, no distention.  Skin: Intact without lesions or rashes.  Neurologic: Alert and oriented x 3.  Psych: Normal affect. Extremities: No clubbing or cyanosis.  HEENT: Normal.    Assessment/Plan: 1. Atrial fibrillation: Paroxysmal.  She is in NSR today on amiodarone, denies palpitations.  - Continue warfarin.  - She will continue amiodarone, check LFTs/TSH today.  She will need regular eye exam.  2. Chronic diastolic CHF: Echo 67/61 with normal EF, severe LVH, moderate RV systolic function, severe pulmonary hypertension.  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  Cannot do cardiac MRI because of pacemaker.  NYHA class IIIb symptoms but she does not appear volume overloaded on exam and weight is down.  - Continue Lasix 80 mg po bid.  BMET today.   - Will send genetic testing for transthyretin amyloidosis today (wild-type versus genetic).  3. HTN: BP stable on Coreg 3.125 mg bid.  4. Pulmonary hypertension: Severe pulmonary hypertension on echo.  RHC showed pulmonary arterial hypertension.  I am concerned for group 1 PH.  V/Q scan with no chronic PE, PFTs not markedly abnormal.  Serological workup was negative.  - Continue Opsumit and Adcirca 20 mg daily.  Will not increase Adcirca until she stabilizes clinically (just admitted with CHF exacerbation).  5. CKD: Stage 3.  BMET today.  6. Tricuspid regurgitation: Severe on last echo, has TR murmur.  7. Sick sinus syndrome: Medtronic PPM.    Followup 1 month.  Continue to work with PT at rehab.  With dementia and debility, I think that it is going to be difficult for her to live on her own.   Loralie Champagne 07/03/2017

## 2017-07-04 ENCOUNTER — Other Ambulatory Visit (HOSPITAL_COMMUNITY): Payer: Self-pay

## 2017-07-04 ENCOUNTER — Telehealth (HOSPITAL_COMMUNITY): Payer: Self-pay | Admitting: *Deleted

## 2017-07-04 NOTE — Telephone Encounter (Signed)
Katie called to report patient had taken the wrong dose of carvedilol she took 12.5mg  instead of 3.125mg . She is dizzy and her bp was a little low. Her son went to get all of her correct medications. Pt instructed to only take 3.145mf of carvedilol twice daily. Joellen Jersey also suggests we get her home health. Will follow up with Dr.McLeans RN Katrina to see if that was discussed during patients last office visit.

## 2017-07-04 NOTE — Progress Notes (Signed)
Paramedicine Encounter    Patient ID: Natasha Fletcher, female    DOB: [birth year], 84 y.o.   MRN: [id number]   Patient Care Team: Lorene Dy, MD as PCP - General (Internal Medicine) Troy Sine, MD as PCP - Cardiology (Cardiology) Constance Haw, MD as Consulting Physician (Cardiology)  Patient Active Problem List   Diagnosis Date Noted  . Cognitive decline   . Palliative care by specialist   . DNR (do not resuscitate) discussion   . Acute on chronic congestive heart failure ([place])   . Acute on chronic diastolic CHF (congestive heart failure) ([place])   . Sick sinus syndrome due to SA node dysfunction (HCC)   . Shortness of breath 06/13/2017  . Atrial fibrillation ([place]) 03/22/2016  . Bradycardia 08/22/2015  . Junctional (nodal) bradycardia 08/22/2015  . Malnutrition of moderate degree 03/19/2015  . Benign essential HTN   . Bradycardia, drug induced 03/18/2015  . Hypokalemia 02/16/2015  . Thrombocytopenia ([place]) 02/16/2015  . Sepsis due to urinary tract infection ([place])   . Sepsis ([place]) 02/12/2015  . Urinary tract infectious disease   . Long-term (current) use of anticoagulants 07/12/2014  . Chronic anticoagulation 02/11/2014  . Hypertensive cardiovascular disease 01/24/2014  . Chronic diastolic CHF (congestive heart failure) ([place]) 01/24/2014  . Anemia 01/24/2014  . Breast cancer of upper-inner quadrant of right female breast ([place]) 01/24/2014  . Acute diastolic heart failure ([place]) 11/11/2013  . Atrial flutter ([place]) 11/11/2013  . PAF (paroxysmal atrial fibrillation) ([place]) 11/10/2013  . Slurred speech 11/10/2013  . Protein-calorie malnutrition, severe ([place]) 11/06/2013  . Coffee ground emesis 11/05/2013  . Dehydration 10/19/2013  . Urinary retention 10/18/2013  . GERD (gastroesophageal reflux disease) 03/20/2013  . S/P total knee arthroplasty   . UTI (urinary tract infection) 02/21/2013  . OA (osteoarthritis) of knee 02/19/2013  . Hypertension   . Thyroid disease    . Cancer of central portion of female breast ([place]) 04/14/2011    Current Outpatient Medications:  .  amiodarone (PACERONE) 200 MG tablet, Take 0.5 tablets (100 mg total) by mouth daily., Disp: 45 tablet, Rfl: 2 .  carvedilol (COREG) 3.125 MG tablet, Take 1 tablet (3.125 mg total) by mouth 2 (two) times daily with a meal., Disp: 30 tablet, Rfl: 0 .  donepezil (ARICEPT) 5 MG tablet, Take 1 tablet (5 mg total) by mouth at bedtime., Disp: 30 tablet, Rfl: 0 .  ferrous sulfate 325 (65 FE) MG tablet, Take 1 tablet (325 mg total) by mouth every other day., Disp: 30 tablet, Rfl: 0 .  furosemide (LASIX) 80 MG tablet, Take 80mg  in the AM and 40mg  in the PM, Disp: 30 tablet, Rfl:  .  HYDROcodone-acetaminophen (NORCO) 7.5-325 MG tablet, Take 1 tablet by mouth every 6 (six) hours as needed for moderate pain., Disp: 30 tablet, Rfl: 0 .  latanoprost (XALATAN) 0.005 % ophthalmic solution, Place 1 drop into both eyes at bedtime., Disp: , Rfl:  .  Macitentan (OPSUMIT) 10 MG TABS, Take 1 tablet (10 mg total) by mouth daily., Disp: 30 tablet, Rfl: 11 .  polyethylene glycol (MIRALAX / GLYCOLAX) packet, Take 17 g by mouth daily., Disp: 14 each, Rfl: 0 .  sertraline (ZOLOFT) 25 MG tablet, Take 1 tablet (25 mg total) by mouth daily., Disp: 30 tablet, Rfl: 0 .  tadalafil, PAH, (ADCIRCA) 20 MG tablet, Take 1 tablet (20 mg total) by mouth daily., Disp: 30 tablet, Rfl: 11 .  traZODone (DESYREL) 50 MG tablet, Take 50 mg by mouth at bedtime.,  Disp: , Rfl: 2 .  warfarin (COUMADIN) 4 MG tablet, TAKE 1/2 TO 1 TABLET BY MOUTH DAILY AS DIRECTED BY COUMADIN CLINIC, Disp: 30 tablet, Rfl: 2 Allergies  Allergen Reactions  . Lactose Intolerance (Gi) Other (See Comments)    Stomach pain  . Milk-Related Compounds Other (See Comments)    Upset stomach   . Sulfa Antibiotics Itching      Social History   Socioeconomic History  . Marital status: Single    Spouse name: Not on file  . Number of children: Not on file  . Years of  education: Not on file  . Highest education level: Not on file  Occupational History  . Not on file  Social Needs  . Financial resource strain: Not on file  . Food insecurity:    Worry: Not on file    Inability: Not on file  . Transportation needs:    Medical: Not on file    Non-medical: Not on file  Tobacco Use  . Smoking status: Former Smoker    Packs/day: 0.00    Years: 30.00    Pack years: 0.00    Types: Cigarettes    Last attempt to quit: 04/21/1975    Years since quitting: 42.2  . Smokeless tobacco: Never Used  Substance and Sexual Activity  . Alcohol use: Yes    Comment: occasionally  . Drug use: No  . Sexual activity: Not Currently  Lifestyle  . Physical activity:    Days per week: Not on file    Minutes per session: Not on file  . Stress: Not on file  Relationships  . Social connections:    Talks on phone: Not on file    Gets together: Not on file    Attends religious service: Not on file    Active member of club or organization: Not on file    Attends meetings of clubs or organizations: Not on file    Relationship status: Not on file  . Intimate partner violence:    Fear of current or ex partner: Not on file    Emotionally abused: Not on file    Physically abused: Not on file    Forced sexual activity: Not on file  Other Topics Concern  . Not on file  Social History Narrative   From SNF, getting PT and using cane.      Physical Exam      Future Appointments  Date Time Provider [place]  08/10/2017 10:20 AM Troy Sine, MD [facility]  08/15/2017  9:40 AM Larey Dresser, MD [facility] None    BP (!) 90/0 Comment: palpated  Pulse 62   Resp 18   SpO2 98%   80/40 standing   Last visit weight-150 @ clinic   Pt is just home from the rehab facility. She states she feels terrible today and she feels like her breathing is worse.  Not sure if she has home health nurse or home PT. She needs home aide care or care connections.   Her brother is going to pick up her meds at pharmacy- she had 9 ready for p/u however she only needs 4 of them--her carvedilol was decreased but she took a 12.5 tab this morning instead of the 3.125. She does not have the adcirca or opsumit.  Pt states she feels dizzy and appears to very sob upon slight exertion. Her b/p dropped 10pts upon standing. She has poor oral intake and poor fluid intake.  Unable to do  her pill box due to her not having all her meds.  She is not able to ambulate well at home right now. She reports she had to leave due to insurance would no longer let her stay at rehab. Will have to f/u to see if she has home health or home PT. She def needs help now to do her daily needs.  [facility] to order the opsumit and adcirca that will be out tomorrow.  I am very concerned about her being home alone and being able to manage. She has her brother but he is in and out.  Her tadalafil was changed to alyq-[facility] changed their manufacturer to [employer] so that's why the tadalafil was changed.   Marylouise Stacks, [place] Paramedic  07/04/17

## 2017-07-04 NOTE — Progress Notes (Signed)
Came out for 2nd time today for med rec-the brother brought her meds to her-will come back tomor after the opsumit and tadalafil replacement is delivered out to her.  Talked with clinic about if she needs to take her evening CHF meds and they said to hold her pm dose and restart the heart meds tomor.  meds verified and pill box refilled.  She does not have the ferrous sulfate.  She has urology appointment this week and will give another urine sample as there was question about infection.  Last documented dose of the coumadin dosing was 4mg  on Monday and then 2mg  on rest of days so that was done for this week and contacted her coumadin clinic to resch appoint-Friday at 1045.   B/P-110/60 HR-62 sp02-97%  Marylouise Stacks, EMT-Paramedic  07/04/17

## 2017-07-05 ENCOUNTER — Other Ambulatory Visit (HOSPITAL_COMMUNITY): Payer: Self-pay

## 2017-07-05 DIAGNOSIS — N312 Flaccid neuropathic bladder, not elsewhere classified: Secondary | ICD-10-CM | POA: Diagnosis not present

## 2017-07-05 NOTE — Progress Notes (Signed)
Came out today to place the opsumit in her pill box. She did not receive the tadalafil replacement yet.  Her brother changed her urology appointment to today due to her having a lot of lower abd pain/groin pain. She has appointment tomor with PCP.   B/p-112/82 p-62 sp02-96  Marylouise Stacks, EMT-Paramedic  07/05/17

## 2017-07-06 DIAGNOSIS — I5040 Unspecified combined systolic (congestive) and diastolic (congestive) heart failure: Secondary | ICD-10-CM | POA: Diagnosis not present

## 2017-07-08 ENCOUNTER — Ambulatory Visit (INDEPENDENT_AMBULATORY_CARE_PROVIDER_SITE_OTHER): Payer: Medicare HMO | Admitting: Pharmacist Clinician (PhC)/ Clinical Pharmacy Specialist

## 2017-07-08 DIAGNOSIS — Z7901 Long term (current) use of anticoagulants: Secondary | ICD-10-CM

## 2017-07-08 DIAGNOSIS — I4892 Unspecified atrial flutter: Secondary | ICD-10-CM | POA: Diagnosis not present

## 2017-07-08 DIAGNOSIS — Z9981 Dependence on supplemental oxygen: Secondary | ICD-10-CM | POA: Diagnosis not present

## 2017-07-08 DIAGNOSIS — R69 Illness, unspecified: Secondary | ICD-10-CM | POA: Diagnosis not present

## 2017-07-08 DIAGNOSIS — N183 Chronic kidney disease, stage 3 (moderate): Secondary | ICD-10-CM | POA: Diagnosis not present

## 2017-07-08 DIAGNOSIS — I480 Paroxysmal atrial fibrillation: Secondary | ICD-10-CM

## 2017-07-08 DIAGNOSIS — I130 Hypertensive heart and chronic kidney disease with heart failure and stage 1 through stage 4 chronic kidney disease, or unspecified chronic kidney disease: Secondary | ICD-10-CM | POA: Diagnosis not present

## 2017-07-08 DIAGNOSIS — I272 Pulmonary hypertension, unspecified: Secondary | ICD-10-CM | POA: Diagnosis not present

## 2017-07-08 DIAGNOSIS — E1122 Type 2 diabetes mellitus with diabetic chronic kidney disease: Secondary | ICD-10-CM | POA: Diagnosis not present

## 2017-07-08 DIAGNOSIS — I5032 Chronic diastolic (congestive) heart failure: Secondary | ICD-10-CM | POA: Diagnosis not present

## 2017-07-08 LAB — POCT INR: INR: 1.9

## 2017-07-11 ENCOUNTER — Other Ambulatory Visit (HOSPITAL_COMMUNITY): Payer: Self-pay

## 2017-07-11 NOTE — Progress Notes (Signed)
Paramedicine Encounter    Patient ID: Natasha Fletcher, female    DOB: 03/03/1926, 84 y.o.   MRN: [id number]   Patient Care Team: Lorene Dy, MD as PCP - General (Internal Medicine) Troy Sine, MD as PCP - Cardiology (Cardiology) Constance Haw, MD as Consulting Physician (Cardiology)  Patient Active Problem List   Diagnosis Date Noted  . Cognitive decline   . Palliative care by specialist   . DNR (do not resuscitate) discussion   . Acute on chronic congestive heart failure ([place])   . Acute on chronic diastolic CHF (congestive heart failure) ([place])   . Sick sinus syndrome due to SA node dysfunction (HCC)   . Shortness of breath 06/13/2017  . Atrial fibrillation ([place]) 03/22/2016  . Bradycardia 08/22/2015  . Junctional (nodal) bradycardia 08/22/2015  . Malnutrition of moderate degree 03/19/2015  . Benign essential HTN   . Bradycardia, drug induced 03/18/2015  . Hypokalemia 02/16/2015  . Thrombocytopenia ([place]) 02/16/2015  . Sepsis due to urinary tract infection ([place])   . Sepsis ([place]) 02/12/2015  . Urinary tract infectious disease   . Long-term (current) use of anticoagulants 07/12/2014  . Chronic anticoagulation 02/11/2014  . Hypertensive cardiovascular disease 01/24/2014  . Chronic diastolic CHF (congestive heart failure) ([place]) 01/24/2014  . Anemia 01/24/2014  . Breast cancer of upper-inner quadrant of right female breast ([place]) 01/24/2014  . Acute diastolic heart failure ([place]) 11/11/2013  . Atrial flutter ([place]) 11/11/2013  . PAF (paroxysmal atrial fibrillation) ([place]) 11/10/2013  . Slurred speech 11/10/2013  . Protein-calorie malnutrition, severe ([place]) 11/06/2013  . Coffee ground emesis 11/05/2013  . Dehydration 10/19/2013  . Urinary retention 10/18/2013  . GERD (gastroesophageal reflux disease) 03/20/2013  . S/P total knee arthroplasty   . UTI (urinary tract infection) 02/21/2013  . OA (osteoarthritis) of knee 02/19/2013  . Hypertension   . Thyroid disease    . Cancer of central portion of female breast ([place]) 04/14/2011    Current Outpatient Medications:  .  amiodarone (PACERONE) 200 MG tablet, Take 0.5 tablets (100 mg total) by mouth daily., Disp: 45 tablet, Rfl: 2 .  carvedilol (COREG) 3.125 MG tablet, Take 1 tablet (3.125 mg total) by mouth 2 (two) times daily with a meal., Disp: 30 tablet, Rfl: 0 .  donepezil (ARICEPT) 5 MG tablet, Take 1 tablet (5 mg total) by mouth at bedtime., Disp: 30 tablet, Rfl: 0 .  furosemide (LASIX) 80 MG tablet, Take 80mg  in the AM and 40mg  in the PM, Disp: 30 tablet, Rfl:  .  HYDROcodone-acetaminophen (NORCO) 7.5-325 MG tablet, Take 1 tablet by mouth every 6 (six) hours as needed for moderate pain., Disp: 30 tablet, Rfl: 0 .  latanoprost (XALATAN) 0.005 % ophthalmic solution, Place 1 drop into both eyes at bedtime., Disp: , Rfl:  .  Macitentan (OPSUMIT) 10 MG TABS, Take 1 tablet (10 mg total) by mouth daily., Disp: 30 tablet, Rfl: 11 .  polyethylene glycol (MIRALAX / GLYCOLAX) packet, Take 17 g by mouth daily., Disp: 14 each, Rfl: 0 .  sertraline (ZOLOFT) 25 MG tablet, Take 1 tablet (25 mg total) by mouth daily., Disp: 30 tablet, Rfl: 0 .  traZODone (DESYREL) 50 MG tablet, Take 50 mg by mouth at bedtime., Disp: , Rfl: 2 .  warfarin (COUMADIN) 4 MG tablet, TAKE 1/2 TO 1 TABLET BY MOUTH DAILY AS DIRECTED BY COUMADIN CLINIC, Disp: 30 tablet, Rfl: 2 .  ferrous sulfate 325 (65 FE) MG tablet, Take 1 tablet (325 mg total) by mouth every  other day. (Patient not taking: Reported on 07/04/2017), Disp: 30 tablet, Rfl: 0 .  tadalafil, PAH, (ADCIRCA) 20 MG tablet, Take 1 tablet (20 mg total) by mouth daily. (Patient not taking: Reported on 07/11/2017), Disp: 30 tablet, Rfl: 11 Allergies  Allergen Reactions  . Lactose Intolerance (Gi) Other (See Comments)    Stomach pain  . Milk-Related Compounds Other (See Comments)    Upset stomach   . Sulfa Antibiotics Itching      Social History   Socioeconomic History  . Marital  status: Single    Spouse name: Not on file  . Number of children: Not on file  . Years of education: Not on file  . Highest education level: Not on file  Occupational History  . Not on file  Social Needs  . Financial resource strain: Not on file  . Food insecurity:    Worry: Not on file    Inability: Not on file  . Transportation needs:    Medical: Not on file    Non-medical: Not on file  Tobacco Use  . Smoking status: Former Smoker    Packs/day: 0.00    Years: 30.00    Pack years: 0.00    Types: Cigarettes    Last attempt to quit: 04/21/1975    Years since quitting: 42.2  . Smokeless tobacco: Never Used  Substance and Sexual Activity  . Alcohol use: Yes    Comment: occasionally  . Drug use: No  . Sexual activity: Not Currently  Lifestyle  . Physical activity:    Days per week: Not on file    Minutes per session: Not on file  . Stress: Not on file  Relationships  . Social connections:    Talks on phone: Not on file    Gets together: Not on file    Attends religious service: Not on file    Active member of club or organization: Not on file    Attends meetings of clubs or organizations: Not on file    Relationship status: Not on file  . Intimate partner violence:    Fear of current or ex partner: Not on file    Emotionally abused: Not on file    Physically abused: Not on file    Forced sexual activity: Not on file  Other Topics Concern  . Not on file  Social History Narrative   From SNF, getting PT and using cane.      Physical Exam      Future Appointments  Date Time Provider [place]  08/10/2017 10:20 AM Troy Sine, MD [facility]  08/10/2017 11:00 AM [facility]  08/15/2017  9:40 AM Larey Dresser, MD [facility] None    BP 122/68   Pulse 60   Resp 16   SpO2 98%   Weight yesterday-?  Last visit weight-150  Pt reports she is having a good day today, she has not received the replacement of adcirca  yet and the replacement is called alyq and its through [employer] manufacturer. We called again and it should be shipped on Wednesday.  Pt states her appetite is slowly getting better, she is taking melatonin at bedtime as well.  meds verified and pill box refilled.  Pt has not been feeling like to weigh past 2 days. She is now taking oxybutynin and feels much better.  She now has home health agency by [facility] home care, she has PT/OT now and I asked her to ask the home health  about a home aide. [phone] not sure if they are covered by medicare--will inquire.    Marylouise Stacks, [place] [facility] Paramedic  07/11/17

## 2017-07-12 DIAGNOSIS — R69 Illness, unspecified: Secondary | ICD-10-CM | POA: Diagnosis not present

## 2017-07-12 DIAGNOSIS — I480 Paroxysmal atrial fibrillation: Secondary | ICD-10-CM | POA: Diagnosis not present

## 2017-07-12 DIAGNOSIS — I5032 Chronic diastolic (congestive) heart failure: Secondary | ICD-10-CM | POA: Diagnosis not present

## 2017-07-12 DIAGNOSIS — Z7901 Long term (current) use of anticoagulants: Secondary | ICD-10-CM | POA: Diagnosis not present

## 2017-07-12 DIAGNOSIS — I130 Hypertensive heart and chronic kidney disease with heart failure and stage 1 through stage 4 chronic kidney disease, or unspecified chronic kidney disease: Secondary | ICD-10-CM | POA: Diagnosis not present

## 2017-07-12 DIAGNOSIS — N183 Chronic kidney disease, stage 3 (moderate): Secondary | ICD-10-CM | POA: Diagnosis not present

## 2017-07-12 DIAGNOSIS — E1122 Type 2 diabetes mellitus with diabetic chronic kidney disease: Secondary | ICD-10-CM | POA: Diagnosis not present

## 2017-07-12 DIAGNOSIS — I272 Pulmonary hypertension, unspecified: Secondary | ICD-10-CM | POA: Diagnosis not present

## 2017-07-13 DIAGNOSIS — I130 Hypertensive heart and chronic kidney disease with heart failure and stage 1 through stage 4 chronic kidney disease, or unspecified chronic kidney disease: Secondary | ICD-10-CM | POA: Diagnosis not present

## 2017-07-13 DIAGNOSIS — E1122 Type 2 diabetes mellitus with diabetic chronic kidney disease: Secondary | ICD-10-CM | POA: Diagnosis not present

## 2017-07-13 DIAGNOSIS — R69 Illness, unspecified: Secondary | ICD-10-CM | POA: Diagnosis not present

## 2017-07-13 DIAGNOSIS — I480 Paroxysmal atrial fibrillation: Secondary | ICD-10-CM | POA: Diagnosis not present

## 2017-07-13 DIAGNOSIS — I5032 Chronic diastolic (congestive) heart failure: Secondary | ICD-10-CM | POA: Diagnosis not present

## 2017-07-13 DIAGNOSIS — N183 Chronic kidney disease, stage 3 (moderate): Secondary | ICD-10-CM | POA: Diagnosis not present

## 2017-07-13 DIAGNOSIS — Z7901 Long term (current) use of anticoagulants: Secondary | ICD-10-CM | POA: Diagnosis not present

## 2017-07-13 DIAGNOSIS — I272 Pulmonary hypertension, unspecified: Secondary | ICD-10-CM | POA: Diagnosis not present

## 2017-07-14 ENCOUNTER — Other Ambulatory Visit (HOSPITAL_COMMUNITY): Payer: Self-pay

## 2017-07-14 NOTE — Progress Notes (Signed)
Paramedicine Encounter    Patient ID: Natasha Fletcher, female    DOB: Mar 06, 1926, 84 y.o.   MRN: [id number]   Patient Care Team: Lorene Dy, MD as PCP - General (Internal Medicine) Troy Sine, MD as PCP - Cardiology (Cardiology) Constance Haw, MD as Consulting Physician (Cardiology)  Patient Active Problem List   Diagnosis Date Noted  . Cognitive decline   . Palliative care by specialist   . DNR (do not resuscitate) discussion   . Acute on chronic congestive heart failure ([place])   . Acute on chronic diastolic CHF (congestive heart failure) ([place])   . Sick sinus syndrome due to SA node dysfunction (HCC)   . Shortness of breath 06/13/2017  . Atrial fibrillation ([place]) 03/22/2016  . Bradycardia 08/22/2015  . Junctional (nodal) bradycardia 08/22/2015  . Malnutrition of moderate degree 03/19/2015  . Benign essential HTN   . Bradycardia, drug induced 03/18/2015  . Hypokalemia 02/16/2015  . Thrombocytopenia ([place]) 02/16/2015  . Sepsis due to urinary tract infection ([place])   . Sepsis ([place]) 02/12/2015  . Urinary tract infectious disease   . Long-term (current) use of anticoagulants 07/12/2014  . Chronic anticoagulation 02/11/2014  . Hypertensive cardiovascular disease 01/24/2014  . Chronic diastolic CHF (congestive heart failure) ([place]) 01/24/2014  . Anemia 01/24/2014  . Breast cancer of upper-inner quadrant of right female breast ([place]) 01/24/2014  . Acute diastolic heart failure ([place]) 11/11/2013  . Atrial flutter ([place]) 11/11/2013  . PAF (paroxysmal atrial fibrillation) ([place]) 11/10/2013  . Slurred speech 11/10/2013  . Protein-calorie malnutrition, severe ([place]) 11/06/2013  . Coffee ground emesis 11/05/2013  . Dehydration 10/19/2013  . Urinary retention 10/18/2013  . GERD (gastroesophageal reflux disease) 03/20/2013  . S/P total knee arthroplasty   . UTI (urinary tract infection) 02/21/2013  . OA (osteoarthritis) of knee 02/19/2013  . Hypertension   . Thyroid disease    . Cancer of central portion of female breast ([place]) 04/14/2011    Current Outpatient Medications:  .  amiodarone (PACERONE) 200 MG tablet, Take 0.5 tablets (100 mg total) by mouth daily., Disp: 45 tablet, Rfl: 2 .  carvedilol (COREG) 3.125 MG tablet, Take 1 tablet (3.125 mg total) by mouth 2 (two) times daily with a meal., Disp: 30 tablet, Rfl: 0 .  donepezil (ARICEPT) 5 MG tablet, Take 1 tablet (5 mg total) by mouth at bedtime., Disp: 30 tablet, Rfl: 0 .  ferrous sulfate 325 (65 FE) MG tablet, Take 1 tablet (325 mg total) by mouth every other day. (Patient not taking: Reported on 07/04/2017), Disp: 30 tablet, Rfl: 0 .  furosemide (LASIX) 80 MG tablet, Take 80mg  in the AM and 40mg  in the PM, Disp: 30 tablet, Rfl:  .  HYDROcodone-acetaminophen (NORCO) 7.5-325 MG tablet, Take 1 tablet by mouth every 6 (six) hours as needed for moderate pain., Disp: 30 tablet, Rfl: 0 .  latanoprost (XALATAN) 0.005 % ophthalmic solution, Place 1 drop into both eyes at bedtime., Disp: , Rfl:  .  Macitentan (OPSUMIT) 10 MG TABS, Take 1 tablet (10 mg total) by mouth daily., Disp: 30 tablet, Rfl: 11 .  oxybutynin (DITROPAN) 5 MG tablet, Take 5 mg by mouth 3 (three) times daily., Disp: , Rfl:  .  polyethylene glycol (MIRALAX / GLYCOLAX) packet, Take 17 g by mouth daily., Disp: 14 each, Rfl: 0 .  sertraline (ZOLOFT) 25 MG tablet, Take 1 tablet (25 mg total) by mouth daily., Disp: 30 tablet, Rfl: 0 .  tadalafil, PAH, (ADCIRCA) 20 MG tablet, Take 1  tablet (20 mg total) by mouth daily. (Patient not taking: Reported on 07/11/2017), Disp: 30 tablet, Rfl: 11 .  traZODone (DESYREL) 50 MG tablet, Take 50 mg by mouth at bedtime., Disp: , Rfl: 2 .  warfarin (COUMADIN) 4 MG tablet, TAKE 1/2 TO 1 TABLET BY MOUTH DAILY AS DIRECTED BY COUMADIN CLINIC, Disp: 30 tablet, Rfl: 2 Allergies  Allergen Reactions  . Lactose Intolerance (Gi) Other (See Comments)    Stomach pain  . Milk-Related Compounds Other (See Comments)    Upset stomach   .  Sulfa Antibiotics Itching      Social History   Socioeconomic History  . Marital status: Single    Spouse name: Not on file  . Number of children: Not on file  . Years of education: Not on file  . Highest education level: Not on file  Occupational History  . Not on file  Social Needs  . Financial resource strain: Not on file  . Food insecurity:    Worry: Not on file    Inability: Not on file  . Transportation needs:    Medical: Not on file    Non-medical: Not on file  Tobacco Use  . Smoking status: Former Smoker    Packs/day: 0.00    Years: 30.00    Pack years: 0.00    Types: Cigarettes    Last attempt to quit: 04/21/1975    Years since quitting: 42.2  . Smokeless tobacco: Never Used  Substance and Sexual Activity  . Alcohol use: Yes    Comment: occasionally  . Drug use: No  . Sexual activity: Not Currently  Lifestyle  . Physical activity:    Days per week: Not on file    Minutes per session: Not on file  . Stress: Not on file  Relationships  . Social connections:    Talks on phone: Not on file    Gets together: Not on file    Attends religious service: Not on file    Active member of club or organization: Not on file    Attends meetings of clubs or organizations: Not on file    Relationship status: Not on file  . Intimate partner violence:    Fear of current or ex partner: Not on file    Emotionally abused: Not on file    Physically abused: Not on file    Forced sexual activity: Not on file  Other Topics Concern  . Not on file  Social History Narrative   From SNF, getting PT and using cane.      Physical Exam      Future Appointments  Date Time Provider [place]  08/10/2017 10:20 AM Troy Sine, MD [facility]  08/10/2017 11:00 AM [facility]  08/15/2017  9:40 AM Larey Dresser, MD [facility] None    BP 122/60   Pulse 64   Resp 15   Wt 144 lb (65.3 kg)   SpO2 97%   BMI 24.72 kg/m    Weight yesterday-147 Last visit weight-?  Came out today for med rec post speciality pharmacy delivery of her alyq. That was placed in her pill box and I will come out again on Monday for re-visit.  She just took her morning meds so she took one of the alyq (adcirca) tabs during my visit too.  Pt reports she is feeling good today, ate a very good breakfast.  I contacted the home health agency for her to ask about a home  aide and she is eligible for one and PT or OT has to make that referral-the agency reports the home aide is in training at the moment and should be done in a couple wks.  She reports her breathing is not as bad so it appears things are slowly improving. A social worker was here yesterday from [place] [facility] and was going to get her a life alert. Will keep check on that for her.   Marylouise Stacks, [place] [facility] Paramedic  07/14/17

## 2017-07-15 DIAGNOSIS — I480 Paroxysmal atrial fibrillation: Secondary | ICD-10-CM | POA: Diagnosis not present

## 2017-07-15 DIAGNOSIS — I5032 Chronic diastolic (congestive) heart failure: Secondary | ICD-10-CM | POA: Diagnosis not present

## 2017-07-15 DIAGNOSIS — N183 Chronic kidney disease, stage 3 (moderate): Secondary | ICD-10-CM | POA: Diagnosis not present

## 2017-07-15 DIAGNOSIS — R69 Illness, unspecified: Secondary | ICD-10-CM | POA: Diagnosis not present

## 2017-07-15 DIAGNOSIS — Z7901 Long term (current) use of anticoagulants: Secondary | ICD-10-CM | POA: Diagnosis not present

## 2017-07-15 DIAGNOSIS — I130 Hypertensive heart and chronic kidney disease with heart failure and stage 1 through stage 4 chronic kidney disease, or unspecified chronic kidney disease: Secondary | ICD-10-CM | POA: Diagnosis not present

## 2017-07-15 DIAGNOSIS — I272 Pulmonary hypertension, unspecified: Secondary | ICD-10-CM | POA: Diagnosis not present

## 2017-07-15 DIAGNOSIS — E1122 Type 2 diabetes mellitus with diabetic chronic kidney disease: Secondary | ICD-10-CM | POA: Diagnosis not present

## 2017-07-18 ENCOUNTER — Other Ambulatory Visit (HOSPITAL_COMMUNITY): Payer: Self-pay

## 2017-07-18 DIAGNOSIS — I480 Paroxysmal atrial fibrillation: Secondary | ICD-10-CM | POA: Diagnosis not present

## 2017-07-18 DIAGNOSIS — N183 Chronic kidney disease, stage 3 (moderate): Secondary | ICD-10-CM | POA: Diagnosis not present

## 2017-07-18 DIAGNOSIS — R69 Illness, unspecified: Secondary | ICD-10-CM | POA: Diagnosis not present

## 2017-07-18 DIAGNOSIS — I130 Hypertensive heart and chronic kidney disease with heart failure and stage 1 through stage 4 chronic kidney disease, or unspecified chronic kidney disease: Secondary | ICD-10-CM | POA: Diagnosis not present

## 2017-07-18 DIAGNOSIS — I272 Pulmonary hypertension, unspecified: Secondary | ICD-10-CM | POA: Diagnosis not present

## 2017-07-18 DIAGNOSIS — I5032 Chronic diastolic (congestive) heart failure: Secondary | ICD-10-CM | POA: Diagnosis not present

## 2017-07-18 DIAGNOSIS — Z7901 Long term (current) use of anticoagulants: Secondary | ICD-10-CM | POA: Diagnosis not present

## 2017-07-18 DIAGNOSIS — E1122 Type 2 diabetes mellitus with diabetic chronic kidney disease: Secondary | ICD-10-CM | POA: Diagnosis not present

## 2017-07-18 NOTE — Progress Notes (Signed)
Paramedicine Encounter    Patient ID: Natasha Fletcher, female    DOB: Feb 19, 1926, 84 y.o.   MRN: [id number]   Patient Care Team: Lorene Dy, MD as PCP - General (Internal Medicine) Troy Sine, MD as PCP - Cardiology (Cardiology) Constance Haw, MD as Consulting Physician (Cardiology)  Patient Active Problem List   Diagnosis Date Noted  . Cognitive decline   . Palliative care by specialist   . DNR (do not resuscitate) discussion   . Acute on chronic congestive heart failure ([place])   . Acute on chronic diastolic CHF (congestive heart failure) ([place])   . Sick sinus syndrome due to SA node dysfunction (HCC)   . Shortness of breath 06/13/2017  . Atrial fibrillation ([place]) 03/22/2016  . Bradycardia 08/22/2015  . Junctional (nodal) bradycardia 08/22/2015  . Malnutrition of moderate degree 03/19/2015  . Benign essential HTN   . Bradycardia, drug induced 03/18/2015  . Hypokalemia 02/16/2015  . Thrombocytopenia ([place]) 02/16/2015  . Sepsis due to urinary tract infection ([place])   . Sepsis ([place]) 02/12/2015  . Urinary tract infectious disease   . Long-term (current) use of anticoagulants 07/12/2014  . Chronic anticoagulation 02/11/2014  . Hypertensive cardiovascular disease 01/24/2014  . Chronic diastolic CHF (congestive heart failure) ([place]) 01/24/2014  . Anemia 01/24/2014  . Breast cancer of upper-inner quadrant of right female breast ([place]) 01/24/2014  . Acute diastolic heart failure ([place]) 11/11/2013  . Atrial flutter ([place]) 11/11/2013  . PAF (paroxysmal atrial fibrillation) ([place]) 11/10/2013  . Slurred speech 11/10/2013  . Protein-calorie malnutrition, severe ([place]) 11/06/2013  . Coffee ground emesis 11/05/2013  . Dehydration 10/19/2013  . Urinary retention 10/18/2013  . GERD (gastroesophageal reflux disease) 03/20/2013  . S/P total knee arthroplasty   . UTI (urinary tract infection) 02/21/2013  . OA (osteoarthritis) of knee 02/19/2013  . Hypertension   . Thyroid disease    . Cancer of central portion of female breast ([place]) 04/14/2011    Current Outpatient Medications:  .  amiodarone (PACERONE) 200 MG tablet, Take 0.5 tablets (100 mg total) by mouth daily., Disp: 45 tablet, Rfl: 2 .  carvedilol (COREG) 3.125 MG tablet, Take 1 tablet (3.125 mg total) by mouth 2 (two) times daily with a meal., Disp: 30 tablet, Rfl: 0 .  donepezil (ARICEPT) 5 MG tablet, Take 1 tablet (5 mg total) by mouth at bedtime., Disp: 30 tablet, Rfl: 0 .  furosemide (LASIX) 80 MG tablet, Take 80mg  in the AM and 40mg  in the PM, Disp: 30 tablet, Rfl:  .  HYDROcodone-acetaminophen (NORCO) 7.5-325 MG tablet, Take 1 tablet by mouth every 6 (six) hours as needed for moderate pain., Disp: 30 tablet, Rfl: 0 .  latanoprost (XALATAN) 0.005 % ophthalmic solution, Place 1 drop into both eyes at bedtime., Disp: , Rfl:  .  Macitentan (OPSUMIT) 10 MG TABS, Take 1 tablet (10 mg total) by mouth daily., Disp: 30 tablet, Rfl: 11 .  oxybutynin (DITROPAN) 5 MG tablet, Take 5 mg by mouth 3 (three) times daily., Disp: , Rfl:  .  polyethylene glycol (MIRALAX / GLYCOLAX) packet, Take 17 g by mouth daily., Disp: 14 each, Rfl: 0 .  sertraline (ZOLOFT) 25 MG tablet, Take 1 tablet (25 mg total) by mouth daily., Disp: 30 tablet, Rfl: 0 .  tadalafil, PAH, (ADCIRCA) 20 MG tablet, Take 1 tablet (20 mg total) by mouth daily., Disp: 30 tablet, Rfl: 11 .  traZODone (DESYREL) 50 MG tablet, Take 50 mg by mouth at bedtime., Disp: , Rfl: 2 .  warfarin (COUMADIN) 4 MG tablet, TAKE 1/2 TO 1 TABLET BY MOUTH DAILY AS DIRECTED BY COUMADIN CLINIC, Disp: 30 tablet, Rfl: 2 .  ferrous sulfate 325 (65 FE) MG tablet, Take 1 tablet (325 mg total) by mouth every other day. (Patient not taking: Reported on 07/04/2017), Disp: 30 tablet, Rfl: 0 Allergies  Allergen Reactions  . Lactose Intolerance (Gi) Other (See Comments)    Stomach pain  . Milk-Related Compounds Other (See Comments)    Upset stomach   . Sulfa Antibiotics Itching      Social  History   Socioeconomic History  . Marital status: Single    Spouse name: Not on file  . Number of children: Not on file  . Years of education: Not on file  . Highest education level: Not on file  Occupational History  . Not on file  Social Needs  . Financial resource strain: Not on file  . Food insecurity:    Worry: Not on file    Inability: Not on file  . Transportation needs:    Medical: Not on file    Non-medical: Not on file  Tobacco Use  . Smoking status: Former Smoker    Packs/day: 0.00    Years: 30.00    Pack years: 0.00    Types: Cigarettes    Last attempt to quit: 04/21/1975    Years since quitting: 42.2  . Smokeless tobacco: Never Used  Substance and Sexual Activity  . Alcohol use: Yes    Comment: occasionally  . Drug use: No  . Sexual activity: Not Currently  Lifestyle  . Physical activity:    Days per week: Not on file    Minutes per session: Not on file  . Stress: Not on file  Relationships  . Social connections:    Talks on phone: Not on file    Gets together: Not on file    Attends religious service: Not on file    Active member of club or organization: Not on file    Attends meetings of clubs or organizations: Not on file    Relationship status: Not on file  . Intimate partner violence:    Fear of current or ex partner: Not on file    Emotionally abused: Not on file    Physically abused: Not on file    Forced sexual activity: Not on file  Other Topics Concern  . Not on file  Social History Narrative   From SNF, getting PT and using cane.      Physical Exam      Future Appointments  Date Time Provider [place]  08/10/2017 10:20 AM Troy Sine, MD [facility]  08/10/2017 11:00 AM [facility]  08/15/2017  9:40 AM Larey Dresser, MD [facility] None    BP 112/64   Pulse 66   Resp 14   Wt 149 lb (67.6 kg)   SpO2 97%   BMI 25.58 kg/m   Weight yesterday-? Last visit weight-144  Pt  reports she is doing ok, yesterday was a bad day for her as she wasn't feeling well. She stayed in bed all day yesterday. Today she feels better. She missed her AM dose of meds yesterday. When we arrived the OT had just finished with her and will have a home aide for 2 days a week for a couple hours a day.  She reports that she has been passing blood in her urine for 2 days. Called her urologist and nurse  advised it was common for her to experience the blood in her urine bag, no fevers, no chills, she reports for her to stay hydrated. Pt denies any urinary pains. No blood clots in her foley . No swelling noted. pts appetite ok. Breathing doing ok, not good yesterday but better today.  Meds verified and pill box refilled.   Marylouise Stacks, [place] [facility] Paramedic  07/18/17

## 2017-07-19 DIAGNOSIS — H409 Unspecified glaucoma: Secondary | ICD-10-CM | POA: Diagnosis not present

## 2017-07-19 DIAGNOSIS — I130 Hypertensive heart and chronic kidney disease with heart failure and stage 1 through stage 4 chronic kidney disease, or unspecified chronic kidney disease: Secondary | ICD-10-CM | POA: Diagnosis not present

## 2017-07-19 DIAGNOSIS — I2721 Secondary pulmonary arterial hypertension: Secondary | ICD-10-CM | POA: Diagnosis not present

## 2017-07-19 DIAGNOSIS — R69 Illness, unspecified: Secondary | ICD-10-CM | POA: Diagnosis not present

## 2017-07-19 DIAGNOSIS — G8929 Other chronic pain: Secondary | ICD-10-CM | POA: Diagnosis not present

## 2017-07-19 DIAGNOSIS — I509 Heart failure, unspecified: Secondary | ICD-10-CM | POA: Diagnosis not present

## 2017-07-19 DIAGNOSIS — N183 Chronic kidney disease, stage 3 (moderate): Secondary | ICD-10-CM | POA: Diagnosis not present

## 2017-07-19 DIAGNOSIS — G4700 Insomnia, unspecified: Secondary | ICD-10-CM | POA: Diagnosis not present

## 2017-07-19 DIAGNOSIS — I4891 Unspecified atrial fibrillation: Secondary | ICD-10-CM | POA: Diagnosis not present

## 2017-07-19 DIAGNOSIS — I272 Pulmonary hypertension, unspecified: Secondary | ICD-10-CM | POA: Diagnosis not present

## 2017-07-19 DIAGNOSIS — Z7901 Long term (current) use of anticoagulants: Secondary | ICD-10-CM | POA: Diagnosis not present

## 2017-07-19 DIAGNOSIS — I110 Hypertensive heart disease with heart failure: Secondary | ICD-10-CM | POA: Diagnosis not present

## 2017-07-19 DIAGNOSIS — E1122 Type 2 diabetes mellitus with diabetic chronic kidney disease: Secondary | ICD-10-CM | POA: Diagnosis not present

## 2017-07-19 DIAGNOSIS — M1990 Unspecified osteoarthritis, unspecified site: Secondary | ICD-10-CM | POA: Diagnosis not present

## 2017-07-19 DIAGNOSIS — I5032 Chronic diastolic (congestive) heart failure: Secondary | ICD-10-CM | POA: Diagnosis not present

## 2017-07-19 DIAGNOSIS — I480 Paroxysmal atrial fibrillation: Secondary | ICD-10-CM | POA: Diagnosis not present

## 2017-07-20 DIAGNOSIS — I5032 Chronic diastolic (congestive) heart failure: Secondary | ICD-10-CM | POA: Diagnosis not present

## 2017-07-20 DIAGNOSIS — Z7901 Long term (current) use of anticoagulants: Secondary | ICD-10-CM | POA: Diagnosis not present

## 2017-07-20 DIAGNOSIS — I130 Hypertensive heart and chronic kidney disease with heart failure and stage 1 through stage 4 chronic kidney disease, or unspecified chronic kidney disease: Secondary | ICD-10-CM | POA: Diagnosis not present

## 2017-07-20 DIAGNOSIS — N183 Chronic kidney disease, stage 3 (moderate): Secondary | ICD-10-CM | POA: Diagnosis not present

## 2017-07-20 DIAGNOSIS — I480 Paroxysmal atrial fibrillation: Secondary | ICD-10-CM | POA: Diagnosis not present

## 2017-07-20 DIAGNOSIS — I272 Pulmonary hypertension, unspecified: Secondary | ICD-10-CM | POA: Diagnosis not present

## 2017-07-20 DIAGNOSIS — R69 Illness, unspecified: Secondary | ICD-10-CM | POA: Diagnosis not present

## 2017-07-20 DIAGNOSIS — E1122 Type 2 diabetes mellitus with diabetic chronic kidney disease: Secondary | ICD-10-CM | POA: Diagnosis not present

## 2017-07-22 DIAGNOSIS — R69 Illness, unspecified: Secondary | ICD-10-CM | POA: Diagnosis not present

## 2017-07-22 DIAGNOSIS — E1122 Type 2 diabetes mellitus with diabetic chronic kidney disease: Secondary | ICD-10-CM | POA: Diagnosis not present

## 2017-07-22 DIAGNOSIS — I272 Pulmonary hypertension, unspecified: Secondary | ICD-10-CM | POA: Diagnosis not present

## 2017-07-22 DIAGNOSIS — Z7901 Long term (current) use of anticoagulants: Secondary | ICD-10-CM | POA: Diagnosis not present

## 2017-07-22 DIAGNOSIS — I5032 Chronic diastolic (congestive) heart failure: Secondary | ICD-10-CM | POA: Diagnosis not present

## 2017-07-22 DIAGNOSIS — N183 Chronic kidney disease, stage 3 (moderate): Secondary | ICD-10-CM | POA: Diagnosis not present

## 2017-07-22 DIAGNOSIS — I480 Paroxysmal atrial fibrillation: Secondary | ICD-10-CM | POA: Diagnosis not present

## 2017-07-22 DIAGNOSIS — I130 Hypertensive heart and chronic kidney disease with heart failure and stage 1 through stage 4 chronic kidney disease, or unspecified chronic kidney disease: Secondary | ICD-10-CM | POA: Diagnosis not present

## 2017-07-25 ENCOUNTER — Other Ambulatory Visit (HOSPITAL_COMMUNITY): Payer: Self-pay

## 2017-07-25 ENCOUNTER — Other Ambulatory Visit (HOSPITAL_COMMUNITY): Payer: Self-pay | Admitting: *Deleted

## 2017-07-25 MED ORDER — FERROUS SULFATE 325 (65 FE) MG PO TABS: 325.0000 mg | ORAL_TABLET | ORAL | 3 refills | Status: AC

## 2017-07-25 MED ORDER — CARVEDILOL 3.125 MG PO TABS: 3.1250 mg | ORAL_TABLET | Freq: Two times a day (BID) | ORAL | 3 refills | Status: DC

## 2017-07-25 NOTE — Progress Notes (Signed)
Paramedicine Encounter    Patient ID: Natasha Fletcher, female    DOB: 03-04-1926, 84 y.o.   MRN: [id number]   Patient Care Team: Lorene Dy, MD as PCP - General (Internal Medicine) Troy Sine, MD as PCP - Cardiology (Cardiology) Constance Haw, MD as Consulting Physician (Cardiology)  Patient Active Problem List   Diagnosis Date Noted  . Cognitive decline   . Palliative care by specialist   . DNR (do not resuscitate) discussion   . Acute on chronic congestive heart failure ([place])   . Acute on chronic diastolic CHF (congestive heart failure) ([place])   . Sick sinus syndrome due to SA node dysfunction (HCC)   . Shortness of breath 06/13/2017  . Atrial fibrillation ([place]) 03/22/2016  . Bradycardia 08/22/2015  . Junctional (nodal) bradycardia 08/22/2015  . Malnutrition of moderate degree 03/19/2015  . Benign essential HTN   . Bradycardia, drug induced 03/18/2015  . Hypokalemia 02/16/2015  . Thrombocytopenia ([place]) 02/16/2015  . Sepsis due to urinary tract infection ([place])   . Sepsis ([place]) 02/12/2015  . Urinary tract infectious disease   . Long-term (current) use of anticoagulants 07/12/2014  . Chronic anticoagulation 02/11/2014  . Hypertensive cardiovascular disease 01/24/2014  . Chronic diastolic CHF (congestive heart failure) ([place]) 01/24/2014  . Anemia 01/24/2014  . Breast cancer of upper-inner quadrant of right female breast ([place]) 01/24/2014  . Acute diastolic heart failure ([place]) 11/11/2013  . Atrial flutter ([place]) 11/11/2013  . PAF (paroxysmal atrial fibrillation) ([place]) 11/10/2013  . Slurred speech 11/10/2013  . Protein-calorie malnutrition, severe ([place]) 11/06/2013  . Coffee ground emesis 11/05/2013  . Dehydration 10/19/2013  . Urinary retention 10/18/2013  . GERD (gastroesophageal reflux disease) 03/20/2013  . S/P total knee arthroplasty   . UTI (urinary tract infection) 02/21/2013  . OA (osteoarthritis) of knee 02/19/2013  . Hypertension   . Thyroid disease    . Cancer of central portion of female breast ([place]) 04/14/2011    Current Outpatient Medications:  .  amiodarone (PACERONE) 200 MG tablet, Take 0.5 tablets (100 mg total) by mouth daily., Disp: 45 tablet, Rfl: 2 .  carvedilol (COREG) 3.125 MG tablet, Take 1 tablet (3.125 mg total) by mouth 2 (two) times daily with a meal., Disp: 30 tablet, Rfl: 0 .  donepezil (ARICEPT) 5 MG tablet, Take 1 tablet (5 mg total) by mouth at bedtime., Disp: 30 tablet, Rfl: 0 .  furosemide (LASIX) 80 MG tablet, Take 80mg  in the AM and 40mg  in the PM, Disp: 30 tablet, Rfl:  .  HYDROcodone-acetaminophen (NORCO) 7.5-325 MG tablet, Take 1 tablet by mouth every 6 (six) hours as needed for moderate pain., Disp: 30 tablet, Rfl: 0 .  latanoprost (XALATAN) 0.005 % ophthalmic solution, Place 1 drop into both eyes at bedtime., Disp: , Rfl:  .  Macitentan (OPSUMIT) 10 MG TABS, Take 1 tablet (10 mg total) by mouth daily., Disp: 30 tablet, Rfl: 11 .  oxybutynin (DITROPAN) 5 MG tablet, Take 5 mg by mouth 3 (three) times daily., Disp: , Rfl:  .  polyethylene glycol (MIRALAX / GLYCOLAX) packet, Take 17 g by mouth daily., Disp: 14 each, Rfl: 0 .  sertraline (ZOLOFT) 25 MG tablet, Take 1 tablet (25 mg total) by mouth daily., Disp: 30 tablet, Rfl: 0 .  tadalafil, PAH, (ADCIRCA) 20 MG tablet, Take 1 tablet (20 mg total) by mouth daily. (Patient taking differently: Take 20 mg by mouth daily. ), Disp: 30 tablet, Rfl: 11 .  traZODone (DESYREL) 50 MG tablet, Take 50  mg by mouth at bedtime., Disp: , Rfl: 2 .  warfarin (COUMADIN) 4 MG tablet, TAKE 1/2 TO 1 TABLET BY MOUTH DAILY AS DIRECTED BY COUMADIN CLINIC, Disp: 30 tablet, Rfl: 2 .  ferrous sulfate 325 (65 FE) MG tablet, Take 1 tablet (325 mg total) by mouth every other day. (Patient not taking: Reported on 07/04/2017), Disp: 30 tablet, Rfl: 0 Allergies  Allergen Reactions  . Lactose Intolerance (Gi) Other (See Comments)    Stomach pain  . Milk-Related Compounds Other (See Comments)     Upset stomach   . Sulfa Antibiotics Itching      Social History   Socioeconomic History  . Marital status: Single    Spouse name: Not on file  . Number of children: Not on file  . Years of education: Not on file  . Highest education level: Not on file  Occupational History  . Not on file  Social Needs  . Financial resource strain: Not on file  . Food insecurity:    Worry: Not on file    Inability: Not on file  . Transportation needs:    Medical: Not on file    Non-medical: Not on file  Tobacco Use  . Smoking status: Former Smoker    Packs/day: 0.00    Years: 30.00    Pack years: 0.00    Types: Cigarettes    Last attempt to quit: 04/21/1975    Years since quitting: 42.2  . Smokeless tobacco: Never Used  Substance and Sexual Activity  . Alcohol use: Yes    Comment: occasionally  . Drug use: No  . Sexual activity: Not Currently  Lifestyle  . Physical activity:    Days per week: Not on file    Minutes per session: Not on file  . Stress: Not on file  Relationships  . Social connections:    Talks on phone: Not on file    Gets together: Not on file    Attends religious service: Not on file    Active member of club or organization: Not on file    Attends meetings of clubs or organizations: Not on file    Relationship status: Not on file  . Intimate partner violence:    Fear of current or ex partner: Not on file    Emotionally abused: Not on file    Physically abused: Not on file    Forced sexual activity: Not on file  Other Topics Concern  . Not on file  Social History Narrative   From SNF, getting PT and using cane.      Physical Exam      Future Appointments  Date Time Provider [place]  08/10/2017 10:20 AM Troy Sine, MD [facility]  08/10/2017 11:00 AM [facility]  08/15/2017  9:40 AM Larey Dresser, MD [facility] None    BP (!) 116/54   Pulse 68   Resp 17   Wt 146 lb (66.2 kg)   SpO2 97%    BMI 25.06 kg/m   Weight yesterday-144 Last visit weight-149   Pt reports she is not feeling well today, she reports episodes of extreme sob. She reports that she had diarrhea yesterday and today, she has been drinking ensure so that may be the culprit for that. She would like for me to contact doc office and ask if she needs 02 at home. She gets very sob upon exertion and at times when she is sitting.  She does not have  the ferrous sulfate rx-will ask about that as well.  meds verified and pill box refilled. No missed doses this week.  Had to call in donepezil, carvedilol, sertraline.---I think these came from the hosp- so pharmacy likely will not fill them-her PCP is dr Mancel Bale- they are closed today so I will call them back tomor to see if they can ensure her refills get done.  [facility] was able to fill ferrous sulfate and carvedilol.  Called in refill for opsumit too-it will be sent out Friday.  Lungs clear, no swelling noted.  She is out of her lorazepam she uses for sleep and now she will be trying the trazadone to see how it does for her.   Marylouise Stacks, [place] [facility] Paramedic  07/25/17

## 2017-07-26 DIAGNOSIS — E1122 Type 2 diabetes mellitus with diabetic chronic kidney disease: Secondary | ICD-10-CM | POA: Diagnosis not present

## 2017-07-26 DIAGNOSIS — I272 Pulmonary hypertension, unspecified: Secondary | ICD-10-CM | POA: Diagnosis not present

## 2017-07-26 DIAGNOSIS — I130 Hypertensive heart and chronic kidney disease with heart failure and stage 1 through stage 4 chronic kidney disease, or unspecified chronic kidney disease: Secondary | ICD-10-CM | POA: Diagnosis not present

## 2017-07-26 DIAGNOSIS — N183 Chronic kidney disease, stage 3 (moderate): Secondary | ICD-10-CM | POA: Diagnosis not present

## 2017-07-26 DIAGNOSIS — I5032 Chronic diastolic (congestive) heart failure: Secondary | ICD-10-CM | POA: Diagnosis not present

## 2017-07-26 DIAGNOSIS — R69 Illness, unspecified: Secondary | ICD-10-CM | POA: Diagnosis not present

## 2017-07-26 DIAGNOSIS — I480 Paroxysmal atrial fibrillation: Secondary | ICD-10-CM | POA: Diagnosis not present

## 2017-07-26 DIAGNOSIS — Z7901 Long term (current) use of anticoagulants: Secondary | ICD-10-CM | POA: Diagnosis not present

## 2017-07-28 ENCOUNTER — Encounter (HOSPITAL_COMMUNITY): Payer: Self-pay

## 2017-07-28 ENCOUNTER — Other Ambulatory Visit (HOSPITAL_COMMUNITY): Payer: Self-pay

## 2017-07-28 DIAGNOSIS — I5032 Chronic diastolic (congestive) heart failure: Secondary | ICD-10-CM | POA: Diagnosis not present

## 2017-07-28 DIAGNOSIS — I480 Paroxysmal atrial fibrillation: Secondary | ICD-10-CM | POA: Diagnosis not present

## 2017-07-28 DIAGNOSIS — Z7901 Long term (current) use of anticoagulants: Secondary | ICD-10-CM | POA: Diagnosis not present

## 2017-07-28 DIAGNOSIS — N183 Chronic kidney disease, stage 3 (moderate): Secondary | ICD-10-CM | POA: Diagnosis not present

## 2017-07-28 DIAGNOSIS — E1122 Type 2 diabetes mellitus with diabetic chronic kidney disease: Secondary | ICD-10-CM | POA: Diagnosis not present

## 2017-07-28 DIAGNOSIS — I272 Pulmonary hypertension, unspecified: Secondary | ICD-10-CM | POA: Diagnosis not present

## 2017-07-28 DIAGNOSIS — I130 Hypertensive heart and chronic kidney disease with heart failure and stage 1 through stage 4 chronic kidney disease, or unspecified chronic kidney disease: Secondary | ICD-10-CM | POA: Diagnosis not present

## 2017-07-28 DIAGNOSIS — R69 Illness, unspecified: Secondary | ICD-10-CM | POA: Diagnosis not present

## 2017-07-28 NOTE — Progress Notes (Signed)
Paramedicine Encounter    Patient ID: Natasha Fletcher, female    DOB: 11/29/1925, 84 y.o.   MRN: [id number]    Patient Care Team: Lorene Dy, MD as PCP - General (Internal Medicine) Troy Sine, MD as PCP - Cardiology (Cardiology) Constance Haw, MD as Consulting Physician (Cardiology)  Patient Active Problem List   Diagnosis Date Noted  . Cognitive decline   . Palliative care by specialist   . DNR (do not resuscitate) discussion   . Acute on chronic congestive heart failure ([place])   . Acute on chronic diastolic CHF (congestive heart failure) ([place])   . Sick sinus syndrome due to SA node dysfunction (HCC)   . Shortness of breath 06/13/2017  . Atrial fibrillation ([place]) 03/22/2016  . Bradycardia 08/22/2015  . Junctional (nodal) bradycardia 08/22/2015  . Malnutrition of moderate degree 03/19/2015  . Benign essential HTN   . Bradycardia, drug induced 03/18/2015  . Hypokalemia 02/16/2015  . Thrombocytopenia ([place]) 02/16/2015  . Sepsis due to urinary tract infection ([place])   . Sepsis ([place]) 02/12/2015  . Urinary tract infectious disease   . Long-term (current) use of anticoagulants 07/12/2014  . Chronic anticoagulation 02/11/2014  . Hypertensive cardiovascular disease 01/24/2014  . Chronic diastolic CHF (congestive heart failure) ([place]) 01/24/2014  . Anemia 01/24/2014  . Breast cancer of upper-inner quadrant of right female breast ([place]) 01/24/2014  . Acute diastolic heart failure ([place]) 11/11/2013  . Atrial flutter ([place]) 11/11/2013  . PAF (paroxysmal atrial fibrillation) ([place]) 11/10/2013  . Slurred speech 11/10/2013  . Protein-calorie malnutrition, severe ([place]) 11/06/2013  . Coffee ground emesis 11/05/2013  . Dehydration 10/19/2013  . Urinary retention 10/18/2013  . GERD (gastroesophageal reflux disease) 03/20/2013  . S/P total knee arthroplasty   . UTI (urinary tract infection) 02/21/2013  . OA (osteoarthritis) of knee 02/19/2013  . Hypertension   . Thyroid disease    . Cancer of central portion of female breast ([place]) 04/14/2011    Current Outpatient Medications:  .  amiodarone (PACERONE) 200 MG tablet, Take 0.5 tablets (100 mg total) by mouth daily., Disp: 45 tablet, Rfl: 2 .  carvedilol (COREG) 3.125 MG tablet, Take 1 tablet (3.125 mg total) by mouth 2 (two) times daily with a meal., Disp: 30 tablet, Rfl: 3 .  donepezil (ARICEPT) 5 MG tablet, Take 1 tablet (5 mg total) by mouth at bedtime., Disp: 30 tablet, Rfl: 0 .  ferrous sulfate 325 (65 FE) MG tablet, Take 1 tablet (325 mg total) by mouth every other day., Disp: 30 tablet, Rfl: 3 .  furosemide (LASIX) 80 MG tablet, Take 80mg  in the AM and 40mg  in the PM, Disp: 30 tablet, Rfl:  .  HYDROcodone-acetaminophen (NORCO) 7.5-325 MG tablet, Take 1 tablet by mouth every 6 (six) hours as needed for moderate pain., Disp: 30 tablet, Rfl: 0 .  latanoprost (XALATAN) 0.005 % ophthalmic solution, Place 1 drop into both eyes at bedtime., Disp: , Rfl:  .  Macitentan (OPSUMIT) 10 MG TABS, Take 1 tablet (10 mg total) by mouth daily., Disp: 30 tablet, Rfl: 11 .  oxybutynin (DITROPAN) 5 MG tablet, Take 5 mg by mouth 3 (three) times daily., Disp: , Rfl:  .  polyethylene glycol (MIRALAX / GLYCOLAX) packet, Take 17 g by mouth daily., Disp: 14 each, Rfl: 0 .  sertraline (ZOLOFT) 25 MG tablet, Take 1 tablet (25 mg total) by mouth daily., Disp: 30 tablet, Rfl: 0 .  tadalafil, PAH, (ADCIRCA) 20 MG tablet, Take 1 tablet (20 mg total) by  mouth daily. (Patient taking differently: Take 20 mg by mouth daily. ), Disp: 30 tablet, Rfl: 11 .  traZODone (DESYREL) 50 MG tablet, Take 50 mg by mouth at bedtime., Disp: , Rfl: 2 .  warfarin (COUMADIN) 4 MG tablet, TAKE 1/2 TO 1 TABLET BY MOUTH DAILY AS DIRECTED BY COUMADIN CLINIC, Disp: 30 tablet, Rfl: 2 Allergies  Allergen Reactions  . Lactose Intolerance (Gi) Other (See Comments)    Stomach pain  . Milk-Related Compounds Other (See Comments)    Upset stomach   . Sulfa Antibiotics Itching      Social History   Socioeconomic History  . Marital status: Single    Spouse name: Not on file  . Number of children: Not on file  . Years of education: Not on file  . Highest education level: Not on file  Occupational History  . Not on file  Social Needs  . Financial resource strain: Not on file  . Food insecurity:    Worry: Not on file    Inability: Not on file  . Transportation needs:    Medical: Not on file    Non-medical: Not on file  Tobacco Use  . Smoking status: Former Smoker    Packs/day: 0.00    Years: 30.00    Pack years: 0.00    Types: Cigarettes    Last attempt to quit: 04/21/1975    Years since quitting: 42.2  . Smokeless tobacco: Never Used  Substance and Sexual Activity  . Alcohol use: Yes    Comment: occasionally  . Drug use: No  . Sexual activity: Not Currently  Lifestyle  . Physical activity:    Days per week: Not on file    Minutes per session: Not on file  . Stress: Not on file  Relationships  . Social connections:    Talks on phone: Not on file    Gets together: Not on file    Attends religious service: Not on file    Active member of club or organization: Not on file    Attends meetings of clubs or organizations: Not on file    Relationship status: Not on file  . Intimate partner violence:    Fear of current or ex partner: Not on file    Emotionally abused: Not on file    Physically abused: Not on file    Forced sexual activity: Not on file  Other Topics Concern  . Not on file  Social History Narrative   From SNF, getting PT and using cane.      Physical Exam  Pulmonary/Chest: No respiratory distress.  Abdominal: Soft.  Skin: Skin is warm and dry. She is not diaphoretic.        Future Appointments  Date Time Provider [place]  08/10/2017 10:20 AM Troy Sine, MD [facility]  08/10/2017 11:00 AM [facility]  08/15/2017  9:40 AM Larey Dresser, MD [facility] None    ATF  pt CAO to her norm; re-visit this week due to pt C/O SOB.  @heart  failure clinic wanted a vest/clip reading. Upon our arrival pt answered the door walking without her walker and without difficulty. Pt didn't appear to be out of breathe while walking and she spoke in full sentences without difficulty.  While talking about the SOB her RR increased but shortly stopped and began breathing normal. Pill box verified and she has taken all of her medications this week.  It appears that she took Monday night meds  from Sunday slot, therefore I moved those  Pills over to Sunday night. Pt's PT came at the end of our visit and pt was still in good spirits.    Katrina at the heart failure clinic was notified of the same and advised, 12 lead done (pt in paced rhytmn @ 63). No futher action required.  BP (!) 106/56 (BP Location: Right Arm, Patient Position: Sitting, Cuff Size: Normal)   Pulse 60   Resp 16   Wt 144 lb (65.3 kg)   BMI 24.72 kg/m   Weight yesterday-144 Last visit weight-146 **clip reading 37%   Dalilah Curlin, EMT Paramedic 07/28/2017    ACTION: Home visit completed

## 2017-07-29 DIAGNOSIS — R69 Illness, unspecified: Secondary | ICD-10-CM | POA: Diagnosis not present

## 2017-07-29 DIAGNOSIS — I130 Hypertensive heart and chronic kidney disease with heart failure and stage 1 through stage 4 chronic kidney disease, or unspecified chronic kidney disease: Secondary | ICD-10-CM | POA: Diagnosis not present

## 2017-07-29 DIAGNOSIS — I5032 Chronic diastolic (congestive) heart failure: Secondary | ICD-10-CM | POA: Diagnosis not present

## 2017-07-29 DIAGNOSIS — E1122 Type 2 diabetes mellitus with diabetic chronic kidney disease: Secondary | ICD-10-CM | POA: Diagnosis not present

## 2017-07-29 DIAGNOSIS — I272 Pulmonary hypertension, unspecified: Secondary | ICD-10-CM | POA: Diagnosis not present

## 2017-07-29 DIAGNOSIS — Z7901 Long term (current) use of anticoagulants: Secondary | ICD-10-CM | POA: Diagnosis not present

## 2017-07-29 DIAGNOSIS — I480 Paroxysmal atrial fibrillation: Secondary | ICD-10-CM | POA: Diagnosis not present

## 2017-07-29 DIAGNOSIS — N183 Chronic kidney disease, stage 3 (moderate): Secondary | ICD-10-CM | POA: Diagnosis not present

## 2017-08-01 ENCOUNTER — Other Ambulatory Visit (HOSPITAL_COMMUNITY): Payer: Self-pay

## 2017-08-01 DIAGNOSIS — Z1331 Encounter for screening for depression: Secondary | ICD-10-CM | POA: Diagnosis not present

## 2017-08-01 DIAGNOSIS — R42 Dizziness and giddiness: Secondary | ICD-10-CM | POA: Diagnosis not present

## 2017-08-01 DIAGNOSIS — R002 Palpitations: Secondary | ICD-10-CM | POA: Diagnosis not present

## 2017-08-01 DIAGNOSIS — I10 Essential (primary) hypertension: Secondary | ICD-10-CM | POA: Diagnosis not present

## 2017-08-01 DIAGNOSIS — E785 Hyperlipidemia, unspecified: Secondary | ICD-10-CM | POA: Diagnosis not present

## 2017-08-01 DIAGNOSIS — Z0000 Encounter for general adult medical examination without abnormal findings: Secondary | ICD-10-CM | POA: Diagnosis not present

## 2017-08-01 DIAGNOSIS — H9113 Presbycusis, bilateral: Secondary | ICD-10-CM | POA: Diagnosis not present

## 2017-08-01 DIAGNOSIS — I70219 Atherosclerosis of native arteries of extremities with intermittent claudication, unspecified extremity: Secondary | ICD-10-CM | POA: Diagnosis not present

## 2017-08-01 DIAGNOSIS — Z1389 Encounter for screening for other disorder: Secondary | ICD-10-CM | POA: Diagnosis not present

## 2017-08-01 DIAGNOSIS — R1032 Left lower quadrant pain: Secondary | ICD-10-CM | POA: Diagnosis not present

## 2017-08-01 NOTE — Progress Notes (Signed)
Paramedicine Encounter    Patient ID: Natasha Fletcher, female    DOB: May 04, 1925, 84 y.o.   MRN: [id number]   Patient Care Team: Lorene Dy, MD as PCP - General (Internal Medicine) Troy Sine, MD as PCP - Cardiology (Cardiology) Constance Haw, MD as Consulting Physician (Cardiology)  Patient Active Problem List   Diagnosis Date Noted  . Cognitive decline   . Palliative care by specialist   . DNR (do not resuscitate) discussion   . Acute on chronic congestive heart failure ([place])   . Acute on chronic diastolic CHF (congestive heart failure) ([place])   . Sick sinus syndrome due to SA node dysfunction (HCC)   . Shortness of breath 06/13/2017  . Atrial fibrillation ([place]) 03/22/2016  . Bradycardia 08/22/2015  . Junctional (nodal) bradycardia 08/22/2015  . Malnutrition of moderate degree 03/19/2015  . Benign essential HTN   . Bradycardia, drug induced 03/18/2015  . Hypokalemia 02/16/2015  . Thrombocytopenia ([place]) 02/16/2015  . Sepsis due to urinary tract infection ([place])   . Sepsis ([place]) 02/12/2015  . Urinary tract infectious disease   . Long-term (current) use of anticoagulants 07/12/2014  . Chronic anticoagulation 02/11/2014  . Hypertensive cardiovascular disease 01/24/2014  . Chronic diastolic CHF (congestive heart failure) ([place]) 01/24/2014  . Anemia 01/24/2014  . Breast cancer of upper-inner quadrant of right female breast ([place]) 01/24/2014  . Acute diastolic heart failure ([place]) 11/11/2013  . Atrial flutter ([place]) 11/11/2013  . PAF (paroxysmal atrial fibrillation) ([place]) 11/10/2013  . Slurred speech 11/10/2013  . Protein-calorie malnutrition, severe ([place]) 11/06/2013  . Coffee ground emesis 11/05/2013  . Dehydration 10/19/2013  . Urinary retention 10/18/2013  . GERD (gastroesophageal reflux disease) 03/20/2013  . S/P total knee arthroplasty   . UTI (urinary tract infection) 02/21/2013  . OA (osteoarthritis) of knee 02/19/2013  . Hypertension   . Thyroid disease    . Cancer of central portion of female breast ([place]) 04/14/2011    Current Outpatient Medications:  .  amiodarone (PACERONE) 200 MG tablet, Take 0.5 tablets (100 mg total) by mouth daily., Disp: 45 tablet, Rfl: 2 .  carvedilol (COREG) 3.125 MG tablet, Take 1 tablet (3.125 mg total) by mouth 2 (two) times daily with a meal., Disp: 30 tablet, Rfl: 3 .  donepezil (ARICEPT) 5 MG tablet, Take 1 tablet (5 mg total) by mouth at bedtime., Disp: 30 tablet, Rfl: 0 .  furosemide (LASIX) 80 MG tablet, Take 80mg  in the AM and 40mg  in the PM, Disp: 30 tablet, Rfl:  .  HYDROcodone-acetaminophen (NORCO) 7.5-325 MG tablet, Take 1 tablet by mouth every 6 (six) hours as needed for moderate pain., Disp: 30 tablet, Rfl: 0 .  latanoprost (XALATAN) 0.005 % ophthalmic solution, Place 1 drop into both eyes at bedtime., Disp: , Rfl:  .  Macitentan (OPSUMIT) 10 MG TABS, Take 1 tablet (10 mg total) by mouth daily., Disp: 30 tablet, Rfl: 11 .  oxybutynin (DITROPAN) 5 MG tablet, Take 5 mg by mouth 3 (three) times daily., Disp: , Rfl:  .  sertraline (ZOLOFT) 25 MG tablet, Take 1 tablet (25 mg total) by mouth daily., Disp: 30 tablet, Rfl: 0 .  tadalafil, PAH, (ADCIRCA) 20 MG tablet, Take 1 tablet (20 mg total) by mouth daily. (Patient taking differently: Take 20 mg by mouth daily. ), Disp: 30 tablet, Rfl: 11 .  traZODone (DESYREL) 50 MG tablet, Take 50 mg by mouth at bedtime., Disp: , Rfl: 2 .  warfarin (COUMADIN) 4 MG tablet, TAKE 1/2 TO  1 TABLET BY MOUTH DAILY AS DIRECTED BY COUMADIN CLINIC, Disp: 30 tablet, Rfl: 2 .  ferrous sulfate 325 (65 FE) MG tablet, Take 1 tablet (325 mg total) by mouth every other day. (Patient not taking: Reported on 08/01/2017), Disp: 30 tablet, Rfl: 3 .  polyethylene glycol (MIRALAX / GLYCOLAX) packet, Take 17 g by mouth daily. (Patient not taking: Reported on 08/01/2017), Disp: 14 each, Rfl: 0 Allergies  Allergen Reactions  . Lactose Intolerance (Gi) Other (See Comments)    Stomach pain  .  Milk-Related Compounds Other (See Comments)    Upset stomach   . Sulfa Antibiotics Itching      Social History   Socioeconomic History  . Marital status: Single    Spouse name: Not on file  . Number of children: Not on file  . Years of education: Not on file  . Highest education level: Not on file  Occupational History  . Not on file  Social Needs  . Financial resource strain: Not on file  . Food insecurity:    Worry: Not on file    Inability: Not on file  . Transportation needs:    Medical: Not on file    Non-medical: Not on file  Tobacco Use  . Smoking status: Former Smoker    Packs/day: 0.00    Years: 30.00    Pack years: 0.00    Types: Cigarettes    Last attempt to quit: 04/21/1975    Years since quitting: 42.3  . Smokeless tobacco: Never Used  Substance and Sexual Activity  . Alcohol use: Yes    Comment: occasionally  . Drug use: No  . Sexual activity: Not Currently  Lifestyle  . Physical activity:    Days per week: Not on file    Minutes per session: Not on file  . Stress: Not on file  Relationships  . Social connections:    Talks on phone: Not on file    Gets together: Not on file    Attends religious service: Not on file    Active member of club or organization: Not on file    Attends meetings of clubs or organizations: Not on file    Relationship status: Not on file  . Intimate partner violence:    Fear of current or ex partner: Not on file    Emotionally abused: Not on file    Physically abused: Not on file    Forced sexual activity: Not on file  Other Topics Concern  . Not on file  Social History Narrative   From SNF, getting PT and using cane.      Physical Exam  Constitutional: She is oriented to person, place, and time. She appears well-nourished.  Eyes: Pupils are equal, round, and reactive to light.  Cardiovascular: Normal rate and intact distal pulses.  Pulmonary/Chest: Effort normal and breath sounds normal.  Abdominal: Soft.   Musculoskeletal: Normal range of motion.  Neurological: She is alert and oriented to person, place, and time.  Skin: Skin is warm and dry.  Psychiatric: She has a normal mood and affect. Her behavior is normal.        Future Appointments  Date Time Provider [place]  08/10/2017 10:20 AM Troy Sine, MD [facility]  08/10/2017 11:00 AM [facility]  08/15/2017  9:40 AM Larey Dresser, MD [facility] None    BP (!) 108/58   Pulse 67   Wt 146 lb (66.2 kg)   SpO2 97%  BMI 25.06 kg/m   Weight yesterday-146 Last visit weight-144  Visit with pt at home, she states a little tired due to going to appointment this morning.  Pt complains short of breath with activity, not as much sitting or laying in bed.  Lungs sounds are clear.  Pt denies dizziness, no headaches.  No swelling noted in extremities.  Called Dr Mancel Bale office and talked about refills for following meds: sertraline and donepezil.  Nurse asked if pt needed to continue these meds and she will speak with dr Mancel Bale about it and call me back.  Called in refill for Alyq.  Carvedilol, Trazedone and iron ready for pickup, advised pt and she will let her brother know so he can pick them up for her.  Meds verified and pill box refilled with what she has left. Will come back this week once meds are picked up and will finish pill box.   Marylouise Stacks, [facility] Paramedic  08/01/17

## 2017-08-02 DIAGNOSIS — E1122 Type 2 diabetes mellitus with diabetic chronic kidney disease: Secondary | ICD-10-CM | POA: Diagnosis not present

## 2017-08-02 DIAGNOSIS — N183 Chronic kidney disease, stage 3 (moderate): Secondary | ICD-10-CM | POA: Diagnosis not present

## 2017-08-02 DIAGNOSIS — I5032 Chronic diastolic (congestive) heart failure: Secondary | ICD-10-CM | POA: Diagnosis not present

## 2017-08-02 DIAGNOSIS — I130 Hypertensive heart and chronic kidney disease with heart failure and stage 1 through stage 4 chronic kidney disease, or unspecified chronic kidney disease: Secondary | ICD-10-CM | POA: Diagnosis not present

## 2017-08-02 DIAGNOSIS — Z7901 Long term (current) use of anticoagulants: Secondary | ICD-10-CM | POA: Diagnosis not present

## 2017-08-02 DIAGNOSIS — R69 Illness, unspecified: Secondary | ICD-10-CM | POA: Diagnosis not present

## 2017-08-02 DIAGNOSIS — I480 Paroxysmal atrial fibrillation: Secondary | ICD-10-CM | POA: Diagnosis not present

## 2017-08-02 DIAGNOSIS — I272 Pulmonary hypertension, unspecified: Secondary | ICD-10-CM | POA: Diagnosis not present

## 2017-08-03 ENCOUNTER — Other Ambulatory Visit (HOSPITAL_COMMUNITY): Payer: Self-pay

## 2017-08-03 DIAGNOSIS — E1122 Type 2 diabetes mellitus with diabetic chronic kidney disease: Secondary | ICD-10-CM | POA: Diagnosis not present

## 2017-08-03 DIAGNOSIS — I130 Hypertensive heart and chronic kidney disease with heart failure and stage 1 through stage 4 chronic kidney disease, or unspecified chronic kidney disease: Secondary | ICD-10-CM | POA: Diagnosis not present

## 2017-08-03 DIAGNOSIS — N183 Chronic kidney disease, stage 3 (moderate): Secondary | ICD-10-CM | POA: Diagnosis not present

## 2017-08-03 DIAGNOSIS — R69 Illness, unspecified: Secondary | ICD-10-CM | POA: Diagnosis not present

## 2017-08-03 DIAGNOSIS — I480 Paroxysmal atrial fibrillation: Secondary | ICD-10-CM | POA: Diagnosis not present

## 2017-08-03 DIAGNOSIS — I272 Pulmonary hypertension, unspecified: Secondary | ICD-10-CM | POA: Diagnosis not present

## 2017-08-03 DIAGNOSIS — Z7901 Long term (current) use of anticoagulants: Secondary | ICD-10-CM | POA: Diagnosis not present

## 2017-08-03 DIAGNOSIS — I5032 Chronic diastolic (congestive) heart failure: Secondary | ICD-10-CM | POA: Diagnosis not present

## 2017-08-03 NOTE — Progress Notes (Signed)
Came out after I went to pharmacy to p/u her aricept that the pharmacy left out of her bag---I placed it in her pill box. Will see her next Wednesday.   Marylouise Stacks, EMT-Paramedic  08/03/17

## 2017-08-03 NOTE — Progress Notes (Signed)
Came out today for med rec-brother p/u her meds this morning.  She had the paper for the donepezil but no bag or med to go with it was to be found inside the house. We rechecked the plastic bag also and nothing there.  Pt checked her bedroom and when she came back she appeared to be sob--I checked her 02 sat and it was initially 90%--I also spoke to nurse at clinic about a 6 min walk test for her to see if she needs 02.  I called the pharmacy and they do have the medicine there and I can go by and pick it up.  After a rest at the table it came up to 98%  B/p-110/50  -have to come back out later once I get the aricept and place it in her pill box-- meds verified and pill box refilled.    Marylouise Stacks, EMT-Paramedic  08/03/17

## 2017-08-05 DIAGNOSIS — I480 Paroxysmal atrial fibrillation: Secondary | ICD-10-CM | POA: Diagnosis not present

## 2017-08-05 DIAGNOSIS — I130 Hypertensive heart and chronic kidney disease with heart failure and stage 1 through stage 4 chronic kidney disease, or unspecified chronic kidney disease: Secondary | ICD-10-CM | POA: Diagnosis not present

## 2017-08-05 DIAGNOSIS — N183 Chronic kidney disease, stage 3 (moderate): Secondary | ICD-10-CM | POA: Diagnosis not present

## 2017-08-05 DIAGNOSIS — I5032 Chronic diastolic (congestive) heart failure: Secondary | ICD-10-CM | POA: Diagnosis not present

## 2017-08-05 DIAGNOSIS — I272 Pulmonary hypertension, unspecified: Secondary | ICD-10-CM | POA: Diagnosis not present

## 2017-08-05 DIAGNOSIS — Z7901 Long term (current) use of anticoagulants: Secondary | ICD-10-CM | POA: Diagnosis not present

## 2017-08-05 DIAGNOSIS — R69 Illness, unspecified: Secondary | ICD-10-CM | POA: Diagnosis not present

## 2017-08-05 DIAGNOSIS — E1122 Type 2 diabetes mellitus with diabetic chronic kidney disease: Secondary | ICD-10-CM | POA: Diagnosis not present

## 2017-08-08 DIAGNOSIS — I272 Pulmonary hypertension, unspecified: Secondary | ICD-10-CM | POA: Diagnosis not present

## 2017-08-08 DIAGNOSIS — Z7901 Long term (current) use of anticoagulants: Secondary | ICD-10-CM | POA: Diagnosis not present

## 2017-08-08 DIAGNOSIS — E1122 Type 2 diabetes mellitus with diabetic chronic kidney disease: Secondary | ICD-10-CM | POA: Diagnosis not present

## 2017-08-08 DIAGNOSIS — R69 Illness, unspecified: Secondary | ICD-10-CM | POA: Diagnosis not present

## 2017-08-08 DIAGNOSIS — I130 Hypertensive heart and chronic kidney disease with heart failure and stage 1 through stage 4 chronic kidney disease, or unspecified chronic kidney disease: Secondary | ICD-10-CM | POA: Diagnosis not present

## 2017-08-08 DIAGNOSIS — I480 Paroxysmal atrial fibrillation: Secondary | ICD-10-CM | POA: Diagnosis not present

## 2017-08-08 DIAGNOSIS — I5032 Chronic diastolic (congestive) heart failure: Secondary | ICD-10-CM | POA: Diagnosis not present

## 2017-08-08 DIAGNOSIS — N183 Chronic kidney disease, stage 3 (moderate): Secondary | ICD-10-CM | POA: Diagnosis not present

## 2017-08-10 ENCOUNTER — Other Ambulatory Visit (HOSPITAL_COMMUNITY): Payer: Self-pay

## 2017-08-10 ENCOUNTER — Ambulatory Visit: Payer: Medicare HMO | Admitting: Cardiovascular Disease

## 2017-08-10 ENCOUNTER — Ambulatory Visit (INDEPENDENT_AMBULATORY_CARE_PROVIDER_SITE_OTHER): Payer: Medicare HMO | Admitting: Pharmacist

## 2017-08-10 ENCOUNTER — Encounter: Payer: Self-pay | Admitting: Cardiovascular Disease

## 2017-08-10 ENCOUNTER — Ambulatory Visit (INDEPENDENT_AMBULATORY_CARE_PROVIDER_SITE_OTHER): Payer: Medicare HMO | Admitting: Cardiovascular Disease

## 2017-08-10 VITALS — BP 124/73 | HR 61 | Ht 64.0 in | Wt 144.2 lb

## 2017-08-10 DIAGNOSIS — Z79899 Other long term (current) drug therapy: Secondary | ICD-10-CM | POA: Diagnosis not present

## 2017-08-10 DIAGNOSIS — D508 Other iron deficiency anemias: Secondary | ICD-10-CM

## 2017-08-10 DIAGNOSIS — I517 Cardiomegaly: Secondary | ICD-10-CM | POA: Diagnosis not present

## 2017-08-10 DIAGNOSIS — I480 Paroxysmal atrial fibrillation: Secondary | ICD-10-CM | POA: Diagnosis not present

## 2017-08-10 DIAGNOSIS — I272 Pulmonary hypertension, unspecified: Secondary | ICD-10-CM

## 2017-08-10 DIAGNOSIS — N184 Chronic kidney disease, stage 4 (severe): Secondary | ICD-10-CM

## 2017-08-10 DIAGNOSIS — Z7901 Long term (current) use of anticoagulants: Secondary | ICD-10-CM

## 2017-08-10 DIAGNOSIS — I5032 Chronic diastolic (congestive) heart failure: Secondary | ICD-10-CM | POA: Diagnosis not present

## 2017-08-10 LAB — POCT INR: INR: 5.2

## 2017-08-10 NOTE — Patient Instructions (Addendum)
Medication Instructions:  Your physician recommends that you continue on your current medications as directed. Please refer to the Current Medication list given to you today.  Labwork: Today (CMET, CBC)  Testing/Procedures: Your physician has requested that you have an echocardiogram in 3 MONTHS. Echocardiography is a painless test that uses sound waves to create images of your heart. It provides your doctor with information about the size and shape of your heart and how well your heart's chambers and valves are working. This procedure takes approximately one hour. There are no restrictions for this procedure.  This will be done at our [facility] location:  [address]  Follow-Up: 4 months with Dr. Claiborne Billings  Any Other Special Instructions Will Be Listed Below (If Applicable).     If you need a refill on your cardiac medications before your next appointment, please call your pharmacy.

## 2017-08-10 NOTE — Progress Notes (Signed)
Paramedicine Encounter    Patient ID: Natasha Fletcher, female    DOB: Feb 29, 1926, 84 y.o.   MRN: [id number]   Patient Care Team: Lorene Dy, MD as PCP - General (Internal Medicine) Troy Sine, MD as PCP - Cardiology (Cardiology) Constance Haw, MD as Consulting Physician (Cardiology)  Patient Active Problem List   Diagnosis Date Noted  . Cognitive decline   . Palliative care by specialist   . DNR (do not resuscitate) discussion   . Acute on chronic congestive heart failure ([place])   . Acute on chronic diastolic CHF (congestive heart failure) ([place])   . Sick sinus syndrome due to SA node dysfunction (HCC)   . Shortness of breath 06/13/2017  . Atrial fibrillation ([place]) 03/22/2016  . Bradycardia 08/22/2015  . Junctional (nodal) bradycardia 08/22/2015  . Malnutrition of moderate degree 03/19/2015  . Benign essential HTN   . Bradycardia, drug induced 03/18/2015  . Hypokalemia 02/16/2015  . Thrombocytopenia ([place]) 02/16/2015  . Sepsis due to urinary tract infection ([place])   . Sepsis ([place]) 02/12/2015  . Urinary tract infectious disease   . Long-term (current) use of anticoagulants 07/12/2014  . Chronic anticoagulation 02/11/2014  . Hypertensive cardiovascular disease 01/24/2014  . Chronic diastolic CHF (congestive heart failure) ([place]) 01/24/2014  . Anemia 01/24/2014  . Breast cancer of upper-inner quadrant of right female breast ([place]) 01/24/2014  . Acute diastolic heart failure ([place]) 11/11/2013  . Atrial flutter ([place]) 11/11/2013  . PAF (paroxysmal atrial fibrillation) ([place]) 11/10/2013  . Slurred speech 11/10/2013  . Protein-calorie malnutrition, severe ([place]) 11/06/2013  . Coffee ground emesis 11/05/2013  . Dehydration 10/19/2013  . Urinary retention 10/18/2013  . GERD (gastroesophageal reflux disease) 03/20/2013  . S/P total knee arthroplasty   . UTI (urinary tract infection) 02/21/2013  . OA (osteoarthritis) of knee 02/19/2013  . Hypertension   . Thyroid disease    . Cancer of central portion of female breast ([place]) 04/14/2011    Current Outpatient Medications:  .  amiodarone (PACERONE) 200 MG tablet, Take 0.5 tablets (100 mg total) by mouth daily., Disp: 45 tablet, Rfl: 2 .  carvedilol (COREG) 3.125 MG tablet, Take 1 tablet (3.125 mg total) by mouth 2 (two) times daily with a meal., Disp: 30 tablet, Rfl: 3 .  donepezil (ARICEPT) 5 MG tablet, Take 1 tablet (5 mg total) by mouth at bedtime., Disp: 30 tablet, Rfl: 0 .  ferrous sulfate 325 (65 FE) MG tablet, Take 1 tablet (325 mg total) by mouth every other day., Disp: 30 tablet, Rfl: 3 .  furosemide (LASIX) 80 MG tablet, Take 80mg  in the AM and 40mg  in the PM, Disp: 30 tablet, Rfl:  .  HYDROcodone-acetaminophen (NORCO) 7.5-325 MG tablet, Take 1 tablet by mouth every 6 (six) hours as needed for moderate pain., Disp: 30 tablet, Rfl: 0 .  latanoprost (XALATAN) 0.005 % ophthalmic solution, Place 1 drop into both eyes at bedtime., Disp: , Rfl:  .  Macitentan (OPSUMIT) 10 MG TABS, Take 1 tablet (10 mg total) by mouth daily., Disp: 30 tablet, Rfl: 11 .  oxybutynin (DITROPAN) 5 MG tablet, Take 5 mg by mouth 3 (three) times daily., Disp: , Rfl:  .  polyethylene glycol (MIRALAX / GLYCOLAX) packet, Take 17 g by mouth daily., Disp: 14 each, Rfl: 0 .  sertraline (ZOLOFT) 25 MG tablet, Take 1 tablet (25 mg total) by mouth daily., Disp: 30 tablet, Rfl: 0 .  tadalafil, PAH, (ADCIRCA) 20 MG tablet, Take 1 tablet (20 mg total) by mouth  daily. (Patient taking differently: Take 20 mg by mouth daily. ), Disp: 30 tablet, Rfl: 11 .  traZODone (DESYREL) 50 MG tablet, Take 50 mg by mouth at bedtime., Disp: , Rfl: 2 .  warfarin (COUMADIN) 4 MG tablet, TAKE 1/2 TO 1 TABLET BY MOUTH DAILY AS DIRECTED BY COUMADIN CLINIC, Disp: 30 tablet, Rfl: 2 Allergies  Allergen Reactions  . Lactose Intolerance (Gi) Other (See Comments)    Stomach pain  . Milk-Related Compounds Other (See Comments)    Upset stomach   . Sulfa Antibiotics Itching       Social History   Socioeconomic History  . Marital status: Single    Spouse name: Not on file  . Number of children: Not on file  . Years of education: Not on file  . Highest education level: Not on file  Occupational History  . Not on file  Social Needs  . Financial resource strain: Not on file  . Food insecurity:    Worry: Not on file    Inability: Not on file  . Transportation needs:    Medical: Not on file    Non-medical: Not on file  Tobacco Use  . Smoking status: Former Smoker    Packs/day: 0.00    Years: 30.00    Pack years: 0.00    Types: Cigarettes    Last attempt to quit: 04/21/1975    Years since quitting: 42.3  . Smokeless tobacco: Never Used  Substance and Sexual Activity  . Alcohol use: Yes    Comment: occasionally  . Drug use: No  . Sexual activity: Not Currently  Lifestyle  . Physical activity:    Days per week: Not on file    Minutes per session: Not on file  . Stress: Not on file  Relationships  . Social connections:    Talks on phone: Not on file    Gets together: Not on file    Attends religious service: Not on file    Active member of club or organization: Not on file    Attends meetings of clubs or organizations: Not on file    Relationship status: Not on file  . Intimate partner violence:    Fear of current or ex partner: Not on file    Emotionally abused: Not on file    Physically abused: Not on file    Forced sexual activity: Not on file  Other Topics Concern  . Not on file  Social History Narrative   From SNF, getting PT and using cane.      Physical Exam      Future Appointments  Date Time Provider [place]  08/15/2017  9:40 AM Larey Dresser, MD [facility] None  08/15/2017 11:00 AM [facility]  11/10/2017  8:30 AM [facility] 1 [id number]  12/21/2017 10:40 AM Troy Sine, MD [facility]    There were no vitals taken for this visit.  *Duplicate  entry*  Marylouise Stacks, [place] Paramedic  08/10/17  ACTION: {Paramed Action:[phone]

## 2017-08-10 NOTE — Progress Notes (Signed)
Patient ID: Natasha Fletcher, female   DOB: 1925/10/02, 84 y.o.   MRN: [id number]   HPI: Natasha Fletcher is a 84 y.o. female who presents to the office today for a 3 month follow-up cardiology evaluation.    [name] is status post right total knee replacement in July 2015 and developed postoperative anemia, urinary retention and dehydration.  She underwent red cell transfusion.  In August 2015, she developed acute renal failure was omitted to the emergency room with a creatinine of 7.2, potassium 6.2.  She was hydrated, her ACE inhibitor inhibitor was discontinued and a Foley was placed.  She subsequently developed an episode of presyncope and was found to be in atrial fibrillation with RVR leading to her readmission.  Several days later.  Cardiology consultation was obtained in the hospital on 11/11/2013.  An echo revealed ejection fraction of 65-70% with grade 2 diastolic dysfunction and there was evidence for an intracavity gradient of 16 mm.  She was on anticoagulation and ultimately underwent cardioversion in October 2015.  She was in normal sinus rhythm.  On subsequent follow-up in November 2015.  She saw Natasha Fletcher in March 2016.  When I saw  her in October 2016 her ECG showed atrial flutter with 4:1 conduction.  I recommended an echo Doppler study which showed an EF of 55-60% and evidence for severe LVH.  There was moderate left atrial and mild biatrial dilatation.  I was to see her back in follow-up but I have not seen her since. Since I saw her in December 2016 she was admitted to [facility] hospital with a UTI and was in atrial fibrillation. Cardizem and metoprolol were increased, although she developed significant bradycardia and hypotension leading to subsequent dose reduction. In early February.  She was found to be in atrial fibrillation with rapid ventricular response.  Since her hospitalization, she has been seen by several APPs. She also really was referred for a cardioversion for atrial  fibrillation which was done on 06/04/2015 by Dr. Meda Coffee. She presents now for follow-up evaluation.  She underwent an Medtronic permanent pacemaker implantation for sick sinus syndrome in May 2017.  She has been on chronic anticoagulation with warfarin.  She has had issues with shortness of breath and lower extremity edema necessitating increasing diuretic therapy.   An echo Doppler study on 01/19/2018showed severe LVH with normal systolic function with an EF of 60-65%.  There was grade 2 diastolic dysfunction.  Was moderate tricuspid regurgitation.  She had severe PA pressure increased at 69 mmHg.    She has seen Dr. Curt Bears in April 2018.  She was having shortness of breath but was not felt to have evidence for heart failure on exam.  Device interrogation showed flat histograms without much are rate variability.  Her blood pressure was stable.  She was seen by Bernerd Pho 01/17/2017 and again had complaints of shortness of breath and fatigue.  She did not appear to be volume overload by physical exam.  Her echo in January 2018 showed an EF of 60-65% with grade 2 diastolic dysfunction, moderate TR, and elevated pulmonary pressures.  She was not having any palpitations.  Her blood pressure was controlled.    When I  saw her in November 2018, I recommended a follow-up echo Doppler study to reassess her pulmonary hypertension.  This showed hyperdynamic LV function with severe LVH and grade 3 diastolic dysfunction.  There was mild aortic insufficiency, moderate RV dilation with reduced RV function, severe TR, and severe  pulmonary hypertension with PA systolic pressure at 79 mm.  As result, I referred her to Dr. Benjamine Mola for further evaluation.  Subsequently, she has undergone a right left heart cardiac catheterization, which confirms severe pulmonary arterial hypertension with a PA at 72/22 and a mean of 41.  PVR was 5.4 WU. She socially underwent PFTs as well as a VQ scan.  VQ scan did not reveal any  chronic PE.  PFTs were not markedly abnormal.  With her severe LVH there was some concern for cardiac amyloidosis, TTR versus AL and because of her pacemaker she is not a candidate for cardiac MRI.  She underwent a serologic studies which essentially were negative.  She had a negative urine immunofixation, negative ANA, negative anti-Jo 1 antibody, negative anti-SCL 70, SPEP with faint M spike, and RF was negative.  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  Genetic testing for transthyretin amyloidosis is wild-type versus genetic were apparently sent.  She has been on Opsumit and Adcirca 20 mg daily was added to her regimen.  On last laboratory, creatinine was 1.87 in March 2019.  She denies chest pain.  She admits to shortness of breath with activity.  PND orthopnea.  She is unaware of any arrhythmia.  She presents for reevaluation.   Past Medical History:  Diagnosis Date  . Arthritis    "all over"  . Asthma   . Atrial fibrillation (Natasha Fletcher)    a. s/p DCCV in 01/2014 b. repeat DCCV in 06/2015, started on Amiodarone at that time. On Coumadin for anticoagulation.  . Cancer (Natasha Fletcher)    rt breast  . Chronic diastolic CHF (congestive heart failure) ([place])    a. 08/2015: Echo with EF of 60-65%, no WMA, LA mod dilated, mild TR.  . Diabetes mellitus without complication ([place])   . Edema leg   . Fast breathing    "because of pill"  . GERD (gastroesophageal reflux disease)   . Glaucoma   . Glaucoma   . Hypertension   . Incontinence   . Pneumonia    h/o younger  . S/P total knee arthroplasty   . SSS (sick sinus syndrome) ([place])    a. s/p Medtronic Advisa DR MRI [id number] (serial number [id number]) PPM placement in 08/2015  . Swelling    bilateral feet/ legs, more left.  . Thyroid disease    "something years ago"  . UTI (urinary tract infection) 02/12/2015  . Wears dentures   . Wears glasses     Past Surgical History:  Procedure Laterality Date  . ABDOMINAL HYSTERECTOMY     . APPENDECTOMY    . BREAST BIOPSY  04/27/2011   Procedure: BREAST BIOPSY WITH NEEDLE LOCALIZATION;  Surgeon: Edward Jolly, MD;  Location: [place];  Service: General;  Laterality: Right;  right needle localized breast lumpectomy   . BREAST LUMPECTOMY  04/27/11   right breast by [place]  . CARDIOVERSION N/A 02/01/2014   Procedure: CARDIOVERSION;  Surgeon: Candee Furbish, MD;  Location: [facility] ENDOSCOPY;  Service: Cardiovascular;  Laterality: N/A;  . CARDIOVERSION N/A 06/04/2015   Procedure: CARDIOVERSION;  Surgeon: Dorothy Spark, MD;  Location: [place];  Service: Cardiovascular;  Laterality: N/A;  . CARDIOVERSION N/A 07/14/2015   Procedure: CARDIOVERSION;  Surgeon: Thayer Headings, MD;  Location: [facility] ENDOSCOPY;  Service: Cardiovascular;  Laterality: N/A;  . CARPAL TUNNEL RELEASE Bilateral   . COLONOSCOPY    . EP IMPLANTABLE DEVICE N/A 08/22/2015   Procedure: Pacemaker Implant;  Surgeon: Will Meredith Leeds, MD;  Medtronic Advisa DR MRI [id number] (serial number [id number]);  Laterality: N/A;  . EYE SURGERY     both cataracts  . RIGHT HEART CATH N/A 04/21/2017   Procedure: RIGHT HEART CATH;  Surgeon: Larey Dresser, MD;  Location: [place] CV LAB;  Service: Cardiovascular;  Laterality: N/A;  . SHOULDER SURGERY Left   . TEE WITHOUT CARDIOVERSION N/A 02/01/2014   Procedure: TRANSESOPHAGEAL ECHOCARDIOGRAM (TEE);  Surgeon: Candee Furbish, MD;  Location[facility] END[facility]COPY;  Service: Cardiovascular;  Laterality: N/A;  . TONSILLECTOMY    . TOTAL HIP ARTHROPLASTY Bilateral   . TOTAL KNEE ARTHROPLASTY Right 02/19/2013   Procedure: RIGHT TOTAL KNEE ARTHROPLASTY;  Surgeon: Gearlean Alf, MD;  Location: [facility] ORS;  Service: Orthopedics;  Laterality: Right;  . TOTAL KNEE ARTHROPLASTY Left 10/15/2013   Procedure: LEFT TOTAL KNEE ARTHROPLASTY;  Surgeon: Gearlean Alf, MD;  Location: [facility]S;  Service: Orthopedics;  Laterality: Left;    Allergies  Allergen Reactions  . Lactose  Intolerance (Gi) Other (See Comments)    Stomach pain  . Milk-Related Compounds Other (See Comments)    Upset stomach   . Sulfa Antibiotics Itching    Current Outpatient Medications  Medication Sig Dispense Refill  . amiodarone (PACERONE) 200 MG tablet Take 0.5 tablets (100 mg total) by mouth daily. 45 tablet 2  . carvedilol (COREG) 3.125 MG tablet Take 1 tablet (3.125 mg total) by mouth 2 (two) times daily with a meal. 30 tablet 3  . donepezil (ARICEPT) 5 MG tablet Take 1 tablet (5 mg total) by mouth at bedtime. 30 tablet 0  . ferrous sulfate 325 (65 FE) MG tablet Take 1 tablet (325 mg total) by mouth every other day. 30 tablet 3  . furosemide (LASIX) 80 MG tablet Take 36m in the AM and 41min the PM 30 tablet   . HYDROcodone-acetaminophen (NORCO) 7.5-325 MG tablet Take 1 tablet by mouth every 6 (six) hours as needed for moderate pain. 30 tablet 0  . latanoprost (XALATAN) 0.005 % ophthalmic solution Place 1 drop into both eyes at bedtime.    . Macitentan (OPSUMIT) 10 MG TABS Take 1 tablet (10 mg total) by mouth daily. 30 tablet 11  . oxybutynin (DITROPAN) 5 MG tablet Take 5 mg by mouth 3 (three) times daily.    . polyethylene glycol (MIRALAX / GLYCOLAX) packet Take 17 g by mouth daily. 14 each 0  . sertraline (ZOLOFT) 25 MG tablet Take 1 tablet (25 mg total) by mouth daily. 30 tablet 0  . tadalafil, PAH, (ADCIRCA) 20 MG tablet Take 1 tablet (20 mg total) by mouth daily. (Patient taking differently: Take 20 mg by mouth daily. ) 30 tablet 11  . traZODone (DESYREL) 50 MG tablet Take 50 mg by mouth at bedtime.  2  . warfarin (COUMADIN) 4 MG tablet TAKE 1/2 TO 1 TABLET BY MOUTH DAILY AS DIRECTED BY COUMADIN CLINIC 30 tablet 2   No current facility-administered medications for this visit.     Social History   Socioeconomic History  . Marital status: Single    Spouse name: Not on file  . Number of children: Not on file  . Years of education: Not on file  . Highest education level: Not  on file  Occupational History  . Not on file  Social Needs  . Financial resource strain: Not on file  . Food insecurity:    Worry: Not on file    Inability: Not  on file  . Transportation needs:    Medical: Not on file    Non-medical: Not on file  Tobacco Use  . Smoking status: Former Smoker    Packs/day: 0.00    Years: 30.00    Pack years: 0.00    Types: Cigarettes    Last attempt to quit: 04/21/1975    Years since quitting: 42.3  . Smokeless tobacco: Never Used  Substance and Sexual Activity  . Alcohol use: Yes    Comment: occasionally  . Drug use: No  . Sexual activity: Not Currently  Lifestyle  . Physical activity:    Days per week: Not on file    Minutes per session: Not on file  . Stress: Not on file  Relationships  . Social connections:    Talks on phone: Not on file    Gets together: Not on file    Attends religious service: Not on file    Active member of club or organization: Not on file    Attends meetings of clubs or organizations: Not on file    Relationship status: Not on file  . Intimate partner violence:    Fear of current or ex partner: Not on file    Emotionally abused: Not on file    Physically abused: Not on file    Forced sexual activity: Not on file  Other Topics Concern  . Not on file  Social History Narrative   From SNF, getting PT and using cane.      Family History  Problem Relation Age of Onset  . Heart disease Mother   . Stroke Mother   . Hypertension Mother   . Stroke Father   . Hypertension Father   . Stroke Brother   . Heart attack Neg Hx     ROS General: Negative; No fevers, chills, or night sweats HEENT: Negative; No changes in vision or hearing, sinus congestion, difficulty swallowing Pulmonary: Negative; No cough, wheezing, shortness of breath, hemoptysis Cardiovascular: See HPI:  GI: Negative; No nausea, vomiting, diarrhea, or abdominal pain GU: Negative; No dysuria, hematuria, or difficulty voiding Musculoskeletal:  Negative; no myalgias, joint pain, or weakness Hematologic: Negative; no easy bruising, bleeding Endocrine: Negative; no heat/cold intolerance; no diabetes, Neuro: Negative; no changes in balance, headaches Skin: Negative; No rashes or skin lesions Psychiatric: Negative; No behavioral problems, depression Sleep: Negative; No snoring,  daytime sleepiness, hypersomnolence, bruxism, restless legs, hypnogognic hallucinations. Other comprehensive 14 point system review is negative   Physical Exam BP 124/73   Pulse 61   Ht _0  (1.626 m)   Wt 144 lb 3.2 oz (65.4 kg)   BMI 24.75 kg/m    Repeat blood pressure by me was 124/70  Wt Readings from Last 3 Encounters:  08/10/17 144 lb 3.2 oz (65.4 kg)  08/01/17 146 lb (66.2 kg)  07/28/17 144 lb (65.3 kg)   General: Alert, oriented, no distress.  Skin: normal turgor, no rashes, warm and dry HEENT: Normocephalic, atraumatic. Pupils equal round and reactive to light; sclera anicteric; extraocular muscles intact;  Nose without nasal septal hypertrophy Mouth/Parynx benign; Mallinpatti scale Neck: No JVD (~7 cm) , no carotid bruits; normal carotid upstroke Lungs: clear to ausculatation and percussion; no wheezing or rales Chest wall: without tenderness to palpitation Heart: PMI not displaced, RRR, s1 s2 normal, 2/6 systolic murmur LSB, no diastolic murmur, no rubs, gallops, thrills, or heaves Abdomen: soft, nontender; no hepatosplenomehaly, BS+; abdominal aorta nontender and not dilated by palpation. Back: no CVA tenderness Pulses  2+ Musculoskeletal: full range of motion, normal strength, no joint deformities Extremities: no clubbing cyanosis or edema, Homan's sign negative  Neurologic: grossly nonfocal; Cranial nerves grossly wnl Psychologic: Normal mood and affec   ECG (independently read by me): Atrially paced rhythm with a PR prolongation at 288 ms.  QTc interval 498 ms.  November 2018 ECG (independently read by me): Atrially paced  rhythm at 60 bpm.  Prolonged AV conduction with PR interval 266 ms.  QTc interval increased at 508 ms.  March 2018 ECG (independently read by me): Atrially paced rhythm at 60 bpm.  Prolonged AV conduction with PR interval 286 ms.  Nonspecific ST-T changes.  QTc interval increased at 522 ms.  January 2018 ECG (independently read by me): Atrially paced rhythm at 60 bpm.  Prolonged AV conduction with a PR interval at 270 ms.  Nonspecific ST-T changes.  March 2017 ECG (independently read by me): Atrial flutter with a ventricular rate at 87 bpm with variable block.  Nonspecific ST-T changes.  My personal review of her March 2016 ECG shows sinus bradycardia at 58  LABS:  BMP Latest Ref Rng & Units 07/01/2017 06/20/2017 06/19/2017  Glucose 65 - 99 mg/dL 90 81 82  BUN 6 - 20 mg/dL 37(H) 31(H) 30(H)  Creatinine 0.44 - 1.00 mg/dL 1.87(H) 1.48(H) 1.38(H)  Sodium 135 - 145 mmol/L 138 136 137  Potassium 3.5 - 5.1 mmol/L 3.5 4.1 4.4  Chloride 101 - 111 mmol/L 101 101 106  CO2 22 - 32 mmol/L 25 26 21(L)  Calcium 8.9 - 10.3 mg/dL 9.1 8.6(L) 8.7(L)    Hepatic Function Latest Ref Rng & Units 07/01/2017 06/13/2017 03/01/2017  Total Protein 6.5 - 8.1 g/dL 6.9 7.5 6.7  Albumin 3.5 - 5.0 g/dL 3.6 3.6 4.0  AST 15 - 41 U/L _0 ALT 14 - 54 U/L 13(L) 13(L) 16  Alk Phosphatase 38 - 126 U/L 90 88 106  Total Bilirubin 0.3 - 1.2 mg/dL 1.0 1.2 0.7  Bilirubin, Direct 0.1 - 0.5 mg/dL - 0.2 0.19    CBC Latest Ref Rng & Units 06/20/2017 06/19/2017 06/18/2017  WBC 4.0 - 10.5 K/uL 4.3 3.8(L) 4.2  Hemoglobin 12.0 - 15.0 g/dL 9.5(L) 8.8(L) 9.0(L)  Hematocrit 36.0 - 46.0 % 30.5(L) 28.1(L) 29.4(L)  Platelets 150 - 400 K/uL 240 223 231   Lab Results  Component Value Date   MCV 82.9 06/20/2017   MCV 83.1 06/19/2017   MCV 81.7 06/18/2017    Lab Results  Component Value Date   TSH 0.854 07/01/2017    BNP    Component Value Date/Time   BNP 553.0 (H) 06/13/2017 0915   BNP 386.4 (H) 03/08/2016 1024     ProBNP    Component Value Date/Time   PROBNP 4,311 (H) 01/17/2017 1438   PROBNP 3,143.0 (H) 11/17/2013 0442    Lipid Panel  No results found for: CHOL, TRIG, HDL, CHOLHDL, VLDL, LDLCALC, LDLDIRECT  INR 5.2 today; she was advised on adjustment to her warfarin dosage  RADIOLOGY: No results found.  IMPRESSION:  1. Chronic diastolic CHF (congestive heart failure) ([place])   2. Severe Pulmonary hypertension, unspecified ([place])   3. Medication management   4. Other iron deficiency anemia   5. Severe left ventricular hypertrophy;PYP scan suggestive of TTR amyloid   6. Long term current use of anticoagulant therapy   7. Chronic kidney disease (CKD), stage IV (severe) ([facility])     ASSESSMENT AND PLAN: Ms. Nowak is an 84 year-old African-American female who  has a history of atrial flutter and PAF and underwent insertion of a permanent pacemaker for sick sinus syndrome in May 2017.  She has a history of hypertension, chronic diastolic heart failure and was found to develop progressive pulmonary hypertension leading to referral to Dr. Aundra Dubin.  Right heart catheterization showed severe pulmonary arterial hypertension and mildly elevated pulmonary capillary wedge pressure.  PVR was 5.4.  She was felt most likely to have Group 1 pulmonary arterial hypertension and does not have significant  parenchymal lung disease.  A VQ scan showed no chronic PEs and PFT showed minimal obstructive/restrition.  With severe LVH, she was felt possibly to have amyloidosis.  A technetium pyrophosphate study is suggestive of transthyretin amyloid.  She is undergoing continued evaluation with Dr. Algernon Huxley and has a scheduled follow-up appointment next week.  She has had renal insufficiency.  I will check a chemistry profile and CBC today so that Dr. Algernon Huxley will have these results available to him at his next office visit.  Presently she appears euvolemic.  Her lungs are clear.  There is no edema.  She is atrially paced with  100% capture and has prolonged AV conduction with a PR interval at _0 ms.  There is LVH with repolarization abnormality.  She is now on Opsumit and Adcirca.  Her INR is supratherapeutic today on warfarin and she was advised to hold her dose for several days with dose adjustment per pharmacy.  I have recommended a follow-up echo Doppler study be obtained in the next 2 to 3 months on her current therapy to see if there is been significant improvement in her previous TR and pulmonary hypertension.  I will see her in 4 months for reevaluation.  Time spent: 25 minutes Troy Sine, MD, [facility]  08/10/2017 9:42 AM

## 2017-08-10 NOTE — Progress Notes (Signed)
Paramedicine Encounter    Patient ID: Natasha Fletcher, female    DOB: 05/05/1925, 84 y.o.   MRN: [id number]   Patient Care Team: Lorene Dy, MD as PCP - General (Internal Medicine) Troy Sine, MD as PCP - Cardiology (Cardiology) Constance Haw, MD as Consulting Physician (Cardiology)  Patient Active Problem List   Diagnosis Date Noted  . Cognitive decline   . Palliative care by specialist   . DNR (do not resuscitate) discussion   . Acute on chronic congestive heart failure ([place])   . Acute on chronic diastolic CHF (congestive heart failure) ([place])   . Sick sinus syndrome due to SA node dysfunction (H[facility])   . Shortness of breath 06/13/2017  . Atrial fibrillation ([place]) 03/22/2016  . Bradycardia 08/22/2015  . Junctional (nodal) bradycardia 08/22/2015  . Malnutrition of moderate degree 03/19/2015  . Benign essential HTN   . Bradycardia, drug induced 03/18/2015  . Hypokalemia 02/16/2015  . Thrombocytopenia ([place]) 02/16/2015  . Sepsis due to urinary tract infection ([place])   . Sepsis ([place]) 02/12/2015  . Urinary tract infectious disease   . Long-term (current) use of anticoagulants 07/12/2014  . Chronic anticoagulation 02/11/2014  . Hypertensive cardiovascular disease 01/24/2014  . Chronic diastolic CHF (congestive heart failure) ([place]) 01/24/2014  . Anemia 01/24/2014  . Breast cancer of upper-inner quadrant of right female breast ([place]) 01/24/2014  . Acute diastolic heart failure ([place]) 11/11/2013  . Atrial flutter ([place]) 11/11/2013  . PAF (paroxysmal atrial fibrillation) ([place]) 11/10/2013  . Slurred speech 11/10/2013  . Protein-calorie malnutrition, severe ([place]) 11/06/2013  . Coffee ground emesis 11/05/2013  . Dehydration 10/19/2013  . Urinary retention 10/18/2013  . GERD (gastroesophageal reflux disease) 03/20/2013  . S/P total knee arthroplasty   . UTI (urinary tract infection) 02/21/2013  . OA (osteoarthritis) of knee 02/19/2013  . Hypertension   . Thyroid disease    . Cancer of central portion of female breast ([place]) 04/14/2011    Current Outpatient Medications:  .  amiodarone (PACERONE) 200 MG tablet, Take 0.5 tablets (100 mg total) by mouth daily., Disp: 45 tablet, Rfl: 2 .  carvedilol (COREG) 3.125 MG tablet, Take 1 tablet (3.125 mg total) by mouth 2 (two) times daily with a meal., Disp: 30 tablet, Rfl: 3 .  donepezil (ARICEPT) 5 MG tablet, Take 1 tablet (5 mg total) by mouth at bedtime., Disp: 30 tablet, Rfl: 0 .  ferrous sulfate 325 (65 FE) MG tablet, Take 1 tablet (325 mg total) by mouth every other day., Disp: 30 tablet, Rfl: 3 .  furosemide (LASIX) 80 MG tablet, Take 80mg  in the AM and 40mg  in the PM, Disp: 30 tablet, Rfl:  .  HYDROcodone-acetaminophen (NORCO) 7.5-325 MG tablet, Take 1 tablet by mouth every 6 (six) hours as needed for moderate pain., Disp: 30 tablet, Rfl: 0 .  latanoprost (XALATAN) 0.005 % ophthalmic solution, Place 1 drop into both eyes at bedtime., Disp: , Rfl:  .  Macitentan (OPSUMIT) 10 MG TABS, Take 1 tablet (10 mg total) by mouth daily., Disp: 30 tablet, Rfl: 11 .  oxybutynin (DITROPAN) 5 MG tablet, Take 5 mg by mouth 3 (three) times daily., Disp: , Rfl:  .  polyethylene glycol (MIRALAX / GLYCOLAX) packet, Take 17 g by mouth daily., Disp: 14 each, Rfl: 0 .  sertraline (ZOLOFT) 25 MG tablet, Take 1 tablet (25 mg total) by mouth daily., Disp: 30 tablet, Rfl: 0 .  tadalafil, PAH, (ADCIRCA) 20 MG tablet, Take 1 tablet (20 mg total) by mouth  daily. (Patient taking differently: Take 20 mg by mouth daily. ), Disp: 30 tablet, Rfl: 11 .  traZODone (DESYREL) 50 MG tablet, Take 50 mg by mouth at bedtime., Disp: , Rfl: 2 .  warfarin (COUMADIN) 4 MG tablet, TAKE 1/2 TO 1 TABLET BY MOUTH DAILY AS DIRECTED BY COUMADIN CLINIC, Disp: 30 tablet, Rfl: 2 Allergies  Allergen Reactions  . Lactose Intolerance (Gi) Other (See Comments)    Stomach pain  . Milk-Related Compounds Other (See Comments)    Upset stomach   . Sulfa Antibiotics Itching       Social History   Socioeconomic History  . Marital status: Single    Spouse name: Not on file  . Number of children: Not on file  . Years of education: Not on file  . Highest education level: Not on file  Occupational History  . Not on file  Social Needs  . Financial resource strain: Not on file  . Food insecurity:    Worry: Not on file    Inability: Not on file  . Transportation needs:    Medical: Not on file    Non-medical: Not on file  Tobacco Use  . Smoking status: Former Smoker    Packs/day: 0.00    Years: 30.00    Pack years: 0.00    Types: Cigarettes    Last attempt to quit: 04/21/1975    Years since quitting: 42.3  . Smokeless tobacco: Never Used  Substance and Sexual Activity  . Alcohol use: Yes    Comment: occasionally  . Drug use: No  . Sexual activity: Not Currently  Lifestyle  . Physical activity:    Days per week: Not on file    Minutes per session: Not on file  . Stress: Not on file  Relationships  . Social connections:    Talks on phone: Not on file    Gets together: Not on file    Attends religious service: Not on file    Active member of club or organization: Not on file    Attends meetings of clubs or organizations: Not on file    Relationship status: Not on file  . Intimate partner violence:    Fear of current or ex partner: Not on file    Emotionally abused: Not on file    Physically abused: Not on file    Forced sexual activity: Not on file  Other Topics Concern  . Not on file  Social History Narrative   From SNF, getting PT and using cane.      Physical Exam      Future Appointments  Date Time Provider [place]  08/15/2017  9:40 AM Larey Dresser, MD [facility] None  08/15/2017 11:00 AM [facility]  11/10/2017  8:30 AM [id number]  12/21/2017 10:40 AM Troy Sine, MD [facility]    BP 128/70   Pulse 64   Resp 15   Wt 144 lb (65.3 kg)    SpO2 97%   BMI 24.72 kg/m   Weight yesterday-144 Last visit weight-146  Pt just arrived home when I arrived, she went to doc today along with her coumadin check today. Her INR was high so instructions are to hold coumadin tonight and tomor and she goes back next Monday and I will come back out Monday as well.  She has been passing blood in urine. She reports a small amount of bright red blood in BM this morning.  She states she feels good today, today her breathing is doing good today, yesterday she had a bad day though-she has good and bad days.  She is using the trazadone with melatonin at bedtime and it seems to be working well-she is now getting 7-8 hrs a night of sleep whereas before she was getting about 3. No missed doses of her meds-meds verifed and pill box refilled.   Marylouise Stacks, [facility] Paramedic  08/10/17

## 2017-08-11 ENCOUNTER — Telehealth: Payer: Self-pay | Admitting: Cardiovascular Disease

## 2017-08-11 NOTE — Telephone Encounter (Signed)
Follow Up:   Pt had an aptt yesterday with Dr Claiborne Billings. She said she have a few questions.

## 2017-08-11 NOTE — Telephone Encounter (Signed)
Patient had a history of severe hypertension for which she is seeing Dr. Algernon Huxley and is now on pulmonary hypertension medication.  She will have a follow-up echo Doppler study to see if her pulmonary hypertension has been significantly improved

## 2017-08-11 NOTE — Telephone Encounter (Signed)
Pt calling stating that she would like to receive a call from Dr. Claiborne Billings explaining the severity of her pulmonary hypertension. She states she knew she had it but on the AVS from 5/8 and has listed Severe pulmonary hypertension. Routed to Dr. Claiborne Billings and Nurse.

## 2017-08-12 NOTE — Telephone Encounter (Signed)
Spoke to patient Dr.Kelly's advice given.Stated she is depressed.Advised to call PCP.Advised to keep all appointments as planned.

## 2017-08-15 ENCOUNTER — Encounter: Payer: Self-pay | Admitting: Neurology

## 2017-08-15 ENCOUNTER — Ambulatory Visit (INDEPENDENT_AMBULATORY_CARE_PROVIDER_SITE_OTHER): Payer: Medicare HMO | Admitting: Pharmacist

## 2017-08-15 ENCOUNTER — Other Ambulatory Visit (HOSPITAL_COMMUNITY): Payer: Self-pay | Admitting: *Deleted

## 2017-08-15 ENCOUNTER — Other Ambulatory Visit (HOSPITAL_COMMUNITY): Payer: Self-pay

## 2017-08-15 ENCOUNTER — Encounter (HOSPITAL_COMMUNITY): Payer: Self-pay | Admitting: Cardiology

## 2017-08-15 ENCOUNTER — Ambulatory Visit (HOSPITAL_COMMUNITY)
Admission: RE | Admit: 2017-08-15 | Discharge: 2017-08-15 | Disposition: A | Discharge status: Home / Self Care | Payer: Medicare HMO | Source: Ambulatory Visit | Attending: Cardiology | Admitting: Cardiology

## 2017-08-15 VITALS — BP 113/56 | HR 82 | Wt 143.0 lb

## 2017-08-15 DIAGNOSIS — I130 Hypertensive heart and chronic kidney disease with heart failure and stage 1 through stage 4 chronic kidney disease, or unspecified chronic kidney disease: Secondary | ICD-10-CM | POA: Diagnosis not present

## 2017-08-15 DIAGNOSIS — I495 Sick sinus syndrome: Secondary | ICD-10-CM | POA: Insufficient documentation

## 2017-08-15 DIAGNOSIS — I5032 Chronic diastolic (congestive) heart failure: Secondary | ICD-10-CM | POA: Diagnosis not present

## 2017-08-15 DIAGNOSIS — I361 Nonrheumatic tricuspid (valve) insufficiency: Secondary | ICD-10-CM | POA: Insufficient documentation

## 2017-08-15 DIAGNOSIS — Z79899 Other long term (current) drug therapy: Secondary | ICD-10-CM | POA: Insufficient documentation

## 2017-08-15 DIAGNOSIS — Z7901 Long term (current) use of anticoagulants: Secondary | ICD-10-CM | POA: Insufficient documentation

## 2017-08-15 DIAGNOSIS — Z87891 Personal history of nicotine dependence: Secondary | ICD-10-CM | POA: Insufficient documentation

## 2017-08-15 DIAGNOSIS — N183 Chronic kidney disease, stage 3 (moderate): Secondary | ICD-10-CM | POA: Insufficient documentation

## 2017-08-15 DIAGNOSIS — I272 Pulmonary hypertension, unspecified: Secondary | ICD-10-CM | POA: Diagnosis not present

## 2017-08-15 DIAGNOSIS — Z853 Personal history of malignant neoplasm of breast: Secondary | ICD-10-CM | POA: Insufficient documentation

## 2017-08-15 DIAGNOSIS — E1122 Type 2 diabetes mellitus with diabetic chronic kidney disease: Secondary | ICD-10-CM | POA: Diagnosis not present

## 2017-08-15 DIAGNOSIS — I480 Paroxysmal atrial fibrillation: Secondary | ICD-10-CM | POA: Diagnosis not present

## 2017-08-15 DIAGNOSIS — E859 Amyloidosis, unspecified: Secondary | ICD-10-CM

## 2017-08-15 LAB — PROTIME-INR
INR: 3.39
PROTHROMBIN TIME: 34 s — AB (ref 11.4–15.2)

## 2017-08-15 LAB — BASIC METABOLIC PANEL
Anion gap: 9 (ref 5–15)
BUN: 17 mg/dL (ref 6–20)
CHLORIDE: 102 mmol/L (ref 101–111)
CO2: 27 mmol/L (ref 22–32)
CREATININE: 1.68 mg/dL — AB (ref 0.44–1.00)
Calcium: 9.3 mg/dL (ref 8.9–10.3)
GFR, EST AFRICAN AMERICAN: 30 mL/min — AB (ref >60)
GFR, EST NON AFRICAN AMERICAN: 26 mL/min — AB (ref >60)
Glucose, Bld: 102 mg/dL — ABNORMAL HIGH (ref 65–99)
Potassium: 3.2 mmol/L — ABNORMAL LOW (ref 3.5–5.1)
SODIUM: 138 mmol/L (ref 135–145)

## 2017-08-15 LAB — POCT INR: INR: 4

## 2017-08-15 MED ORDER — TADALAFIL (PAH) 20 MG PO TABS: 40.0000 mg | ORAL_TABLET | Freq: Every day | ORAL | 11 refills | Status: DC

## 2017-08-15 NOTE — Patient Instructions (Addendum)
Increase Tadalafil 40 mg (2 tabs) daily  You have been referred to Neurology for amyloid Dr. Posey Pronto (they will call you)   Labs drawn today (if we do not call you, then your lab work was stable)   Your physician recommends that you schedule a follow-up appointment in: 2 months with Dr. Aundra Dubin

## 2017-08-15 NOTE — Progress Notes (Signed)
PCP: Dr. Mancel Bale Cardiology: Dr. Claiborne Billings HF Cardiology: Dr. Aundra Dubin  84 yo with history of CKD stage 3, SSS with Medtronic PPM, paroxysmal atrial fibrillation, and chronic diastolic CHF was referred by Dr. Claiborne Billings for evaluation of CHF/pulmonary hypertension. She has had noticeable exertional dyspnea for over a year. It has worsened gradually. Currently, she is short of breath with most ADLs.  Most recent echo in 11/18 showed normal EF with severe LVH, restrictive diastolic function, moderately decreased RV systolic function, and severe pulmonary hypertension.  RHC in 1/19 showed severe PAH and mildly elevated PCWP. V/Q scan showed no chronic PEs and PFTs showed minimal obstructive/restriction.   She was admitted in 3/19 for CHF exacerbation and treated with diuresis.  Weight is down 7 lbs today.  She is now in a SNF for rehab.   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   She returns for followup of CHF and pulmonary hypertension.  She is now home from SNF. Symptomatically stable.  Weight down 7 lbs.  She walks in her apartment, sometimes she is short of breath walking across the apt and sometimes not.  No orthopnea/PND. Rare lightheadedness, no falls. Rare atypical chest pain.  Overall very limited.  She does report tingling in her feet consistent with neuropathy. She is followed by paramedicine.   ECG (personally reviewed): NSR, LAFB  Labs (9/18): K 4.3, creatinine 1.68 Labs (11/18): TSH normal, LFTs normal, NT-proBNP 4311, hgb 9.7 Labs (1/19): K 4.4, creatinine 1.58, hgb 10 Labs (3/19): K 4.1, creatinine 1.48 => 1.87, hgb 9.5, urine immunofixation negative, ANA negative, anti-Jo1 antibody negative, anti-SCL70 negative, SPEP with faint M-spike, RF negative. LFTs/TSH normal.   PMH: 1. CKD stage 3 2. Right breast cancer.  3. Atrial fibrillation: Paroxysmal.  H/o DCCV.  On amiodarone to maintain NSR.   4. Right  TKR 2015 5. Sick sinus syndrome: Medtronic PPM 5/17.  6. Type II diabetes 7. HTN 8. Chronic diastolic CHF: Echo (79/02) with EF 65-70%, severe LVH, restrictive diastolic function, mild MR, mild AI, moderately dilated RV with moderately decreased systolic function, PASP 79 mmHg, severe TR.  - TcPYP scan (2/19): Suggestive of TTR amyloidosis.  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  9. Pulmonary hypertension:  - RHC (1/19): mean RA 8, PA 72/22 mean 41, mean PCWP 20, CI 2.22, PVR 5.4 WU. - PFTs (2/19): minimal obstruction, mild restriction, severely decreased DLCO.  - V/Q scan (2/19): No chronic or acute PE. 10. Severe TR 11. Dementia  Social History   Socioeconomic History  . Marital status: Single    Spouse name: Not on file  . Number of children: Not on file  . Years of education: Not on file  . Highest education level: Not on file  Occupational History  . Not on file  Social Needs  . Financial resource strain: Not on file  . Food insecurity:    Worry: Not on file    Inability: Not on file  . Transportation needs:    Medical: Not on file    Non-medical: Not on file  Tobacco Use  . Smoking status: Former Smoker    Packs/day: 0.00    Years: 30.00    Pack years: 0.00    Types: Cigarettes    Last attempt to quit: 04/21/1975    Years since quitting: 42.3  . Smokeless tobacco: Never Used  Substance and Sexual Activity  . Alcohol use: Yes  Comment: occasionally  . Drug use: No  . Sexual activity: Not Currently  Lifestyle  . Physical activity:    Days per week: Not on file    Minutes per session: Not on file  . Stress: Not on file  Relationships  . Social connections:    Talks on phone: Not on file    Gets together: Not on file    Attends religious service: Not on file    Active member of club or organization: Not on file    Attends meetings of clubs or organizations: Not on file    Relationship status: Not on file  . Intimate partner violence:     Fear of current or ex partner: Not on file    Emotionally abused: Not on file    Physically abused: Not on file    Forced sexual activity: Not on file  Other Topics Concern  . Not on file  Social History Narrative   From SNF, getting PT and using cane.     Family History  Problem Relation Age of Onset  . Heart disease Mother   . Stroke Mother   . Hypertension Mother   . Stroke Father   . Hypertension Father   . Stroke Brother   . Heart attack Neg Hx    ROS: All systems reviewed and negative except as per HPI.   Current Outpatient Medications  Medication Sig Dispense Refill  . amiodarone (PACERONE) 200 MG tablet Take 0.5 tablets (100 mg total) by mouth daily. 45 tablet 2  . carvedilol (COREG) 3.125 MG tablet Take 1 tablet (3.125 mg total) by mouth 2 (two) times daily with a meal. 30 tablet 3  . donepezil (ARICEPT) 5 MG tablet Take 1 tablet (5 mg total) by mouth at bedtime. 30 tablet 0  . ferrous sulfate 325 (65 FE) MG tablet Take 1 tablet (325 mg total) by mouth every other day. 30 tablet 3  . furosemide (LASIX) 80 MG tablet Take 80mg  in the AM and 40mg  in the PM 30 tablet   . HYDROcodone-acetaminophen (NORCO) 7.5-325 MG tablet Take 1 tablet by mouth every 6 (six) hours as needed for moderate pain. 30 tablet 0  . latanoprost (XALATAN) 0.005 % ophthalmic solution Place 1 drop into both eyes at bedtime.    . Macitentan (OPSUMIT) 10 MG TABS Take 1 tablet (10 mg total) by mouth daily. 30 tablet 11  . oxybutynin (DITROPAN) 5 MG tablet Take 5 mg by mouth 3 (three) times daily.    . polyethylene glycol (MIRALAX / GLYCOLAX) packet Take 17 g by mouth daily. 14 each 0  . sertraline (ZOLOFT) 25 MG tablet Take 1 tablet (25 mg total) by mouth daily. 30 tablet 0  . traZODone (DESYREL) 50 MG tablet Take 50 mg by mouth at bedtime.  2  . warfarin (COUMADIN) 4 MG tablet TAKE 1/2 TO 1 TABLET BY MOUTH DAILY AS DIRECTED BY COUMADIN CLINIC 30 tablet 2  . tadalafil, PAH, (ADCIRCA) 20 MG tablet Take 2  tablets (40 mg total) by mouth daily. 30 tablet 11   No current facility-administered medications for this encounter.    BP (!) 113/56   Pulse 82   Wt 143 lb (64.9 kg)   SpO2 95%   BMI 24.55 kg/m  General: NAD Neck: No JVD, no thyromegaly or thyroid nodule.  Lungs: Clear to auscultation bilaterally with normal respiratory effort. CV: Nondisplaced PMI.  Heart regular S1/S2, no S3/S4, 2/6 HSM LLSB.  No peripheral edema.  No  carotid bruit.  Normal pedal pulses.  Abdomen: Soft, nontender, no hepatosplenomegaly, no distention.  Skin: Intact without lesions or rashes.  Neurologic: Alert and oriented x 3.  Psych: Normal affect. Extremities: No clubbing or cyanosis.  HEENT: Normal.    Assessment/Plan: 1. Atrial fibrillation: Paroxysmal.  She is in NSR today on amiodarone, denies palpitations.  - Continue warfarin.  - She will continue amiodarone, LFTs/TSH normal in 3/19.  She will need regular eye exam.  2. Chronic diastolic CHF: Echo 25/05 with normal EF, severe LVH, moderate RV systolic function, severe pulmonary hypertension.  Severe LVH in this patient raises concern for either long-standing poorly controlled HTN or cardiac amyloidosis => TcPYP scan was suggestive of transthyretin amyloidosis, genetic testing suggests hereditary TTR amyloidosis with Val142Ile.  Cannot do cardiac MRI because of pacemaker.  NYHA class III symptoms but she does not appear volume overloaded on exam and weight is down.  - Continue Lasix 80 qam/40 qpm.  BMET today.   - She is limited but lives on her own still.  I will have her see neurology for neuropathy evaluation.  She may be a patisiran candidate for treatment of hereditary amyloidosis. She has no children but does have brothers and sisters.  She will tell them about her amyloidosis diagnosis and we will look into genetic testing if they want it.   3. HTN: BP stable on Coreg 3.125 mg bid.  4. Pulmonary hypertension: Severe pulmonary hypertension on echo.  RHC  showed pulmonary arterial hypertension.  I am concerned for group 1 PH.  V/Q scan with no chronic PE, PFTs not markedly abnormal.  Serological workup was negative.  - Continue Opsumit 10 mg daily.  - Increase Adcirca to 40 mg daily.   5. CKD: Stage 3.  BMET today.  6. Tricuspid regurgitation: Severe on last echo, has TR murmur.  7. Sick sinus syndrome: Medtronic PPM.    Followup in 2 months.   Loralie Champagne 08/15/2017

## 2017-08-15 NOTE — Progress Notes (Signed)
Paramedicine Encounter  Came out this afternoon to do med rec pill box post clinic appointment this morning- Her adcirca (alyq) dose was increased today. I called the speciality pharmacy to verify shipment-they have not received the new dose yet-I called the clinic to ask them to resend it. Will check tomor or wed to make sure the pharmacy got it and will order for her. Her INR was checked at clinic today with her other lab work however pt forgot and got confused and still went to her INR check at the doc office too. She is to hold tonights dose and continue half tab nightly. while I was here with pt clinic called and advised her potassium was 3.2 so she will have to start taking potassium supplement. I advised pt she has 2 rx now to pick up tomor and she will let her brother know.   Marylouise Stacks, EMT-Paramedic  08/15/17

## 2017-08-15 NOTE — Progress Notes (Signed)
Paramedicine Encounter   Patient ID: Natasha Fletcher , female,   DOB: 06/11/1925,84 y.o.,  MRN: [id number]   Met patient in clinic today with provider.  Time spent with patient 17mn  Weight @ clinic-143 Pt adcirca will be increased to 428mdaily.  Dr mcAundra Dubinpoke with pt about her amylodosis and advising her siblings.  She is being referred out to dr patel about further testing for the amylodosis.  Pt reports she felt bad this morning while she was getting ready but was moving too fast and that feeling subsided and she feels better now.  She states the trazadone makes her feel "crazy" when she takes it so she is taking the lorazepam and melatonin for sleep aid.  She is going to do chest xray after she leaves here.  Will see her once she gets home to fill her pill box.   KaMarylouise StacksEMT-Paramedic 08/15/2017   ACTION: Home visit completed

## 2017-08-16 DIAGNOSIS — N312 Flaccid neuropathic bladder, not elsewhere classified: Secondary | ICD-10-CM | POA: Diagnosis not present

## 2017-08-17 ENCOUNTER — Other Ambulatory Visit (HOSPITAL_COMMUNITY): Payer: Self-pay

## 2017-08-17 ENCOUNTER — Telehealth (HOSPITAL_COMMUNITY): Payer: Self-pay | Admitting: Cardiology

## 2017-08-17 DIAGNOSIS — I480 Paroxysmal atrial fibrillation: Secondary | ICD-10-CM | POA: Diagnosis not present

## 2017-08-17 DIAGNOSIS — I5032 Chronic diastolic (congestive) heart failure: Secondary | ICD-10-CM | POA: Diagnosis not present

## 2017-08-17 DIAGNOSIS — Z7901 Long term (current) use of anticoagulants: Secondary | ICD-10-CM | POA: Diagnosis not present

## 2017-08-17 DIAGNOSIS — E1122 Type 2 diabetes mellitus with diabetic chronic kidney disease: Secondary | ICD-10-CM | POA: Diagnosis not present

## 2017-08-17 DIAGNOSIS — R69 Illness, unspecified: Secondary | ICD-10-CM | POA: Diagnosis not present

## 2017-08-17 DIAGNOSIS — I272 Pulmonary hypertension, unspecified: Secondary | ICD-10-CM | POA: Diagnosis not present

## 2017-08-17 DIAGNOSIS — I130 Hypertensive heart and chronic kidney disease with heart failure and stage 1 through stage 4 chronic kidney disease, or unspecified chronic kidney disease: Secondary | ICD-10-CM | POA: Diagnosis not present

## 2017-08-17 DIAGNOSIS — N183 Chronic kidney disease, stage 3 (moderate): Secondary | ICD-10-CM | POA: Diagnosis not present

## 2017-08-17 MED ORDER — POTASSIUM CHLORIDE CRYS ER 20 MEQ PO TBCR: 20.0000 meq | EXTENDED_RELEASE_TABLET | Freq: Every day | ORAL | 3 refills | Status: AC

## 2017-08-17 NOTE — Telephone Encounter (Signed)
Will need rx to start kcl as ordered per lab results

## 2017-08-17 NOTE — Progress Notes (Signed)
Was going to see pt today to place potassium in pill box however when her brother went to pick it up they only gave his the lasix and not the potassium, when I called the pharmacy they did not have it on file so I called the clinic to ask them to resend it to pharmacy.   Marylouise Stacks, EMT-Paramedic  08/17/17

## 2017-08-18 ENCOUNTER — Other Ambulatory Visit: Payer: Self-pay | Admitting: *Deleted

## 2017-08-18 DIAGNOSIS — E859 Amyloidosis, unspecified: Secondary | ICD-10-CM

## 2017-08-19 DIAGNOSIS — I480 Paroxysmal atrial fibrillation: Secondary | ICD-10-CM | POA: Diagnosis not present

## 2017-08-19 DIAGNOSIS — I130 Hypertensive heart and chronic kidney disease with heart failure and stage 1 through stage 4 chronic kidney disease, or unspecified chronic kidney disease: Secondary | ICD-10-CM | POA: Diagnosis not present

## 2017-08-19 DIAGNOSIS — Z7901 Long term (current) use of anticoagulants: Secondary | ICD-10-CM | POA: Diagnosis not present

## 2017-08-19 DIAGNOSIS — R69 Illness, unspecified: Secondary | ICD-10-CM | POA: Diagnosis not present

## 2017-08-19 DIAGNOSIS — I272 Pulmonary hypertension, unspecified: Secondary | ICD-10-CM | POA: Diagnosis not present

## 2017-08-19 DIAGNOSIS — E1122 Type 2 diabetes mellitus with diabetic chronic kidney disease: Secondary | ICD-10-CM | POA: Diagnosis not present

## 2017-08-19 DIAGNOSIS — N183 Chronic kidney disease, stage 3 (moderate): Secondary | ICD-10-CM | POA: Diagnosis not present

## 2017-08-19 DIAGNOSIS — I5032 Chronic diastolic (congestive) heart failure: Secondary | ICD-10-CM | POA: Diagnosis not present

## 2017-08-22 ENCOUNTER — Ambulatory Visit (INDEPENDENT_AMBULATORY_CARE_PROVIDER_SITE_OTHER): Payer: Medicare HMO | Admitting: Pharmacist Clinician (PhC)/ Clinical Pharmacy Specialist

## 2017-08-22 ENCOUNTER — Other Ambulatory Visit (HOSPITAL_COMMUNITY): Payer: Self-pay

## 2017-08-22 DIAGNOSIS — Z7901 Long term (current) use of anticoagulants: Secondary | ICD-10-CM | POA: Diagnosis not present

## 2017-08-22 DIAGNOSIS — I480 Paroxysmal atrial fibrillation: Secondary | ICD-10-CM

## 2017-08-22 LAB — POCT INR: INR: 3

## 2017-08-22 NOTE — Patient Instructions (Signed)
Description   Continue 1/2 tablet every day  Repeat INR in 10 days

## 2017-08-22 NOTE — Progress Notes (Signed)
Paramedicine Encounter    Patient ID: Natasha Fletcher, female    DOB: [birth year], 84 y.o.   MRN: [id number]   Patient Care Team: Lorene Dy, MD as PCP - General (Internal Medicine) Troy Sine, MD as PCP - Cardiology (Cardiology) Constance Haw, MD as Consulting Physician (Cardiology)  Patient Active Problem List   Diagnosis Date Noted  . Cognitive decline   . Palliative care by specialist   . DNR (do not resuscitate) discussion   . Acute on chronic congestive heart failure ([place])   . Acute on chronic diastolic CHF (congestive heart failure) ([place])   . Sick sinus syndrome due to SA node dysfunction (HCC)   . Shortness of breath 06/13/2017  . Atrial fibrillation ([place]) 03/22/2016  . Bradycardia 08/22/2015  . Junctional (nodal) bradycardia 08/22/2015  . Malnutrition of moderate degree 03/19/2015  . Benign essential HTN   . Bradycardia, drug induced 03/18/2015  . Hypokalemia 02/16/2015  . Thrombocytopenia ([place]) 02/16/2015  . Sepsis due to urinary tract infection ([place])   . Sepsis ([place]) 02/12/2015  . Urinary tract infectious disease   . Long-term (current) use of anticoagulants 07/12/2014  . Chronic anticoagulation 02/11/2014  . Hypertensive cardiovascular disease 01/24/2014  . Chronic diastolic CHF (congestive heart failure) ([place]) 01/24/2014  . Anemia 01/24/2014  . Breast cancer of upper-inner quadrant of right female breast ([place]) 01/24/2014  . Acute diastolic heart failure ([place]) 11/11/2013  . Atrial flutter ([place]) 11/11/2013  . PAF (paroxysmal atrial fibrillation) ([place]) 11/10/2013  . Slurred speech 11/10/2013  . Protein-calorie malnutrition, severe ([place]) 11/06/2013  . Coffee ground emesis 11/05/2013  . Dehydration 10/19/2013  . Urinary retention 10/18/2013  . GERD (gastroesophageal reflux disease) 03/20/2013  . S/P total knee arthroplasty   . UTI (urinary tract infection) 02/21/2013  . OA (osteoarthritis) of knee 02/19/2013  . Hypertension   . Thyroid disease    . Cancer of central portion of female breast ([place]) 04/14/2011    Current Outpatient Medications:  .  amiodarone (PACERONE) 200 MG tablet, Take 0.5 tablets (100 mg total) by mouth daily., Disp: 45 tablet, Rfl: 2 .  carvedilol (COREG) 3.125 MG tablet, Take 1 tablet (3.125 mg total) by mouth 2 (two) times daily with a meal., Disp: 30 tablet, Rfl: 3 .  donepezil (ARICEPT) 5 MG tablet, Take 1 tablet (5 mg total) by mouth at bedtime., Disp: 30 tablet, Rfl: 0 .  ferrous sulfate 325 (65 FE) MG tablet, Take 1 tablet (325 mg total) by mouth every other day., Disp: 30 tablet, Rfl: 3 .  furosemide (LASIX) 80 MG tablet, Take 80mg  in the AM and 40mg  in the PM, Disp: 30 tablet, Rfl:  .  HYDROcodone-acetaminophen (NORCO) 7.5-325 MG tablet, Take 1 tablet by mouth every 6 (six) hours as needed for moderate pain., Disp: 30 tablet, Rfl: 0 .  latanoprost (XALATAN) 0.005 % ophthalmic solution, Place 1 drop into both eyes at bedtime., Disp: , Rfl:  .  Macitentan (OPSUMIT) 10 MG TABS, Take 1 tablet (10 mg total) by mouth daily., Disp: 30 tablet, Rfl: 11 .  oxybutynin (DITROPAN) 5 MG tablet, Take 5 mg by mouth 3 (three) times daily., Disp: , Rfl:  .  polyethylene glycol (MIRALAX / GLYCOLAX) packet, Take 17 g by mouth daily., Disp: 14 each, Rfl: 0 .  potassium chloride SA (K-DUR,KLOR-CON) 20 MEQ tablet, Take 1 tablet (20 mEq total) by mouth daily., Disp: 90 tablet, Rfl: 3 .  sertraline (ZOLOFT) 25 MG tablet, Take 1 tablet (25 mg total) by  mouth daily., Disp: 30 tablet, Rfl: 0 .  tadalafil, PAH, (ADCIRCA) 20 MG tablet, Take 2 tablets (40 mg total) by mouth daily., Disp: 30 tablet, Rfl: 11 .  traZODone (DESYREL) 50 MG tablet, Take 50 mg by mouth at bedtime., Disp: , Rfl: 2 .  warfarin (COUMADIN) 4 MG tablet, TAKE 1/2 TO 1 TABLET BY MOUTH DAILY AS DIRECTED BY COUMADIN CLINIC, Disp: 30 tablet, Rfl: 2 Allergies  Allergen Reactions  . Lactose Intolerance (Gi) Other (See Comments)    Stomach pain  . Milk-Related Compounds  Other (See Comments)    Upset stomach   . Sulfa Antibiotics Itching      Social History   Socioeconomic History  . Marital status: Single    Spouse name: Not on file  . Number of children: Not on file  . Years of education: Not on file  . Highest education level: Not on file  Occupational History  . Not on file  Social Needs  . Financial resource strain: Not on file  . Food insecurity:    Worry: Not on file    Inability: Not on file  . Transportation needs:    Medical: Not on file    Non-medical: Not on file  Tobacco Use  . Smoking status: Former Smoker    Packs/day: 0.00    Years: 30.00    Pack years: 0.00    Types: Cigarettes    Last attempt to quit: 04/21/1975    Years since quitting: 42.3  . Smokeless tobacco: Never Used  Substance and Sexual Activity  . Alcohol use: Yes    Comment: occasionally  . Drug use: No  . Sexual activity: Not Currently  Lifestyle  . Physical activity:    Days per week: Not on file    Minutes per session: Not on file  . Stress: Not on file  Relationships  . Social connections:    Talks on phone: Not on file    Gets together: Not on file    Attends religious service: Not on file    Active member of club or organization: Not on file    Attends meetings of clubs or organizations: Not on file    Relationship status: Not on file  . Intimate partner violence:    Fear of current or ex partner: Not on file    Emotionally abused: Not on file    Physically abused: Not on file    Forced sexual activity: Not on file  Other Topics Concern  . Not on file  Social History Narrative   From SNF, getting PT and using cane.      Physical Exam      Future Appointments  Date Time Provider [place]  08/31/2017  9:00 AM [facility]  10/20/2017 10:00 AM Larey Dresser, MD [facility] None  11/10/2017  8:30 AM [facility]  12/21/2017 10:40 AM Troy Sine, MD [facility]    BP (!) 106/50   Pulse 68   Wt 141 lb (64 kg)   SpO2 97%   BMI 24.20 kg/m   Weight yesterday-141  Last visit weight-143   Pt reports today she feels out of breath, she went outside to sweep her patio and now she feels bad. Once at rest her breathing returned to normal.  meds verified and pill box, no missed doses this week.  Her brother just p/u the potassium so that was started this week.  Poor appetite.  [facility]ed in [facility]it and [facility] Called in refill on on ferrous sulfate and carvedilol.  She will let her brother know and he will pick it up.    Marylouise Stacks, [facility] Paramedic  08/22/17

## 2017-08-23 ENCOUNTER — Ambulatory Visit: Payer: Medicare HMO | Admitting: Neurology

## 2017-08-23 ENCOUNTER — Encounter: Payer: Medicare HMO | Admitting: Neurology

## 2017-08-29 ENCOUNTER — Other Ambulatory Visit (HOSPITAL_COMMUNITY): Payer: Self-pay

## 2017-08-29 NOTE — Progress Notes (Signed)
Paramedicine Encounter    Patient ID: Natasha Fletcher, female    DOB: [birth year], 84 y.o.   MRN: [id number]   Patient Care Team: Lorene Dy, MD as PCP - General (Internal Medicine) Troy Sine, MD as PCP - Cardiology (Cardiology) Constance Haw, MD as Consulting Physician (Cardiology)  Patient Active Problem List   Diagnosis Date Noted  . Cognitive decline   . Palliative care by specialist   . DNR (do not resuscitate) discussion   . Acute on chronic congestive heart failure ([place])   . Acute on chronic diastolic CHF (congestive heart failure) ([place])   . Sick sinus syndrome due to SA node dysfunction (HCC)   . Shortness of breath 06/13/2017  . Atrial fibrillation ([place]) 03/22/2016  . Bradycardia 08/22/2015  . Junctional (nodal) bradycardia 08/22/2015  . Malnutrition of moderate degree 03/19/2015  . Benign essential HTN   . Bradycardia, drug induced 03/18/2015  . Hypokalemia 02/16/2015  . Thrombocytopenia ([place]) 02/16/2015  . Sepsis due to urinary tract infection ([place])   . Sepsis ([place]) 02/12/2015  . Urinary tract infectious disease   . Long-term (current) use of anticoagulants 07/12/2014  . Chronic anticoagulation 02/11/2014  . Hypertensive cardiovascular disease 01/24/2014  . Chronic diastolic CHF (congestive heart failure) ([place]) 01/24/2014  . Anemia 01/24/2014  . Breast cancer of upper-inner quadrant of right female breast ([place]) 01/24/2014  . Acute diastolic heart failure ([place]) 11/11/2013  . Atrial flutter ([place]) 11/11/2013  . PAF (paroxysmal atrial fibrillation) ([place]) 11/10/2013  . Slurred speech 11/10/2013  . Protein-calorie malnutrition, severe ([place]) 11/06/2013  . Coffee ground emesis 11/05/2013  . Dehydration 10/19/2013  . Urinary retention 10/18/2013  . GERD (gastroesophageal reflux disease) 03/20/2013  . S/P total knee arthroplasty   . UTI (urinary tract infection) 02/21/2013  . OA (osteoarthritis) of knee 02/19/2013  . Hypertension   . Thyroid disease    . Cancer of central portion of female breast ([place]) 04/14/2011    Current Outpatient Medications:  .  amiodarone (PACERONE) 200 MG tablet, Take 0.5 tablets (100 mg total) by mouth daily., Disp: 45 tablet, Rfl: 2 .  carvedilol (COREG) 3.125 MG tablet, Take 1 tablet (3.125 mg total) by mouth 2 (two) times daily with a meal., Disp: 30 tablet, Rfl: 3 .  donepezil (ARICEPT) 5 MG tablet, Take 1 tablet (5 mg total) by mouth at bedtime., Disp: 30 tablet, Rfl: 0 .  ferrous sulfate 325 (65 FE) MG tablet, Take 1 tablet (325 mg total) by mouth every other day., Disp: 30 tablet, Rfl: 3 .  furosemide (LASIX) 80 MG tablet, Take 80mg  in the AM and 40mg  in the PM, Disp: 30 tablet, Rfl:  .  HYDROcodone-acetaminophen (NORCO) 7.5-325 MG tablet, Take 1 tablet by mouth every 6 (six) hours as needed for moderate pain., Disp: 30 tablet, Rfl: 0 .  latanoprost (XALATAN) 0.005 % ophthalmic solution, Place 1 drop into both eyes at bedtime., Disp: , Rfl:  .  Macitentan (OPSUMIT) 10 MG TABS, Take 1 tablet (10 mg total) by mouth daily., Disp: 30 tablet, Rfl: 11 .  oxybutynin (DITROPAN) 5 MG tablet, Take 5 mg by mouth 3 (three) times daily., Disp: , Rfl:  .  polyethylene glycol (MIRALAX / GLYCOLAX) packet, Take 17 g by mouth daily., Disp: 14 each, Rfl: 0 .  potassium chloride SA (K-DUR,KLOR-CON) 20 MEQ tablet, Take 1 tablet (20 mEq total) by mouth daily., Disp: 90 tablet, Rfl: 3 .  sertraline (ZOLOFT) 25 MG tablet, Take 1 tablet (25 mg total) by  mouth daily., Disp: 30 tablet, Rfl: 0 .  tadalafil, PAH, (ADCIRCA) 20 MG tablet, Take 2 tablets (40 mg total) by mouth daily., Disp: 30 tablet, Rfl: 11 .  warfarin (COUMADIN) 4 MG tablet, TAKE 1/2 TO 1 TABLET BY MOUTH DAILY AS DIRECTED BY COUMADIN CLINIC, Disp: 30 tablet, Rfl: 2 .  traZODone (DESYREL) 50 MG tablet, Take 50 mg by mouth at bedtime., Disp: , Rfl: 2 Allergies  Allergen Reactions  . Lactose Intolerance (Gi) Other (See Comments)    Stomach pain  . Milk-Related Compounds  Other (See Comments)    Upset stomach   . Sulfa Antibiotics Itching      Social History   Socioeconomic History  . Marital status: Single    Spouse name: Not on file  . Number of children: Not on file  . Years of education: Not on file  . Highest education level: Not on file  Occupational History  . Not on file  Social Needs  . Financial resource strain: Not on file  . Food insecurity:    Worry: Not on file    Inability: Not on file  . Transportation needs:    Medical: Not on file    Non-medical: Not on file  Tobacco Use  . Smoking status: Former Smoker    Packs/day: 0.00    Years: 30.00    Pack years: 0.00    Types: Cigarettes    Last attempt to quit: 04/21/1975    Years since quitting: 42.3  . Smokeless tobacco: Never Used  Substance and Sexual Activity  . Alcohol use: Yes    Comment: occasionally  . Drug use: No  . Sexual activity: Not Currently  Lifestyle  . Physical activity:    Days per week: Not on file    Minutes per session: Not on file  . Stress: Not on file  Relationships  . Social connections:    Talks on phone: Not on file    Gets together: Not on file    Attends religious service: Not on file    Active member of club or organization: Not on file    Attends meetings of clubs or organizations: Not on file    Relationship status: Not on file  . Intimate partner violence:    Fear of current or ex partner: Not on file    Emotionally abused: Not on file    Physically abused: Not on file    Forced sexual activity: Not on file  Other Topics Concern  . Not on file  Social History Narrative   From SNF, getting PT and using cane.      Physical Exam      Future Appointments  Date Time Provider [place]  08/31/2017  9:00 AM [facility]  10/20/2017 10:00 AM Larey Dresser, MD [facility] None  11/10/2017  8:30 AM [facility]  12/21/2017 10:40 AM Troy Sine, MD [facility]    BP 122/60   Pulse 70   Resp 16   Wt 136 lb (61.7 kg)   SpO2 96%   BMI 23.34 kg/m   Weight yesterday-141 Last visit weight-141  Pt has not received her new dose rx of the alyq(adcirca), will have to get clinic to send in new rx. We ordered the old dose and it should be in on Thursday and I will come back out then to finish out pill box.  She reports not hearing back about the 02-will f/u  with clinic on that. She is still getting sob upon minimal exertion.  She reports a 5lb weight lb loss overnight. She reports she isnt eating much anymore, about half of her meals. She reports drinking plenty of fluids though.  She goes on Wednesday for INR test again. She will call me once she returns and I will come back if a change is needed.  Also her alyq will arrive on Thursday and I will have to come back then as well.   Marylouise Stacks, [place] [facility] Paramedic  08/29/17

## 2017-08-30 ENCOUNTER — Other Ambulatory Visit (HOSPITAL_COMMUNITY): Payer: Self-pay

## 2017-08-30 DIAGNOSIS — I5032 Chronic diastolic (congestive) heart failure: Secondary | ICD-10-CM

## 2017-08-30 NOTE — Progress Notes (Signed)
SATURATION QUALIFICATIONS: (This note is used to comply with regulatory documentation for home oxygen)  Patient Saturations on Room Air at Rest = 95%  Patient Saturations on Room Air while Ambulating = 86%  Patient Saturations on 2 L Liters of oxygen while Ambulating = 96%  Please briefly explain why patient needs home oxygen:

## 2017-08-30 NOTE — Addendum Note (Signed)
Encounter addended by: Shirley Muscat, RN on: 08/30/2017 1:51 PM  Actions taken: Sign clinical note

## 2017-08-31 ENCOUNTER — Ambulatory Visit (INDEPENDENT_AMBULATORY_CARE_PROVIDER_SITE_OTHER): Payer: Medicare HMO | Admitting: Pharmacist Clinician (PhC)/ Clinical Pharmacy Specialist

## 2017-08-31 ENCOUNTER — Other Ambulatory Visit (HOSPITAL_COMMUNITY): Payer: Self-pay

## 2017-08-31 DIAGNOSIS — Z9181 History of falling: Secondary | ICD-10-CM | POA: Diagnosis not present

## 2017-08-31 DIAGNOSIS — Z5189 Encounter for other specified aftercare: Secondary | ICD-10-CM | POA: Diagnosis not present

## 2017-08-31 DIAGNOSIS — Z7901 Long term (current) use of anticoagulants: Secondary | ICD-10-CM

## 2017-08-31 DIAGNOSIS — R5381 Other malaise: Secondary | ICD-10-CM | POA: Diagnosis not present

## 2017-08-31 DIAGNOSIS — M2550 Pain in unspecified joint: Secondary | ICD-10-CM | POA: Diagnosis not present

## 2017-08-31 DIAGNOSIS — I5022 Chronic systolic (congestive) heart failure: Secondary | ICD-10-CM | POA: Diagnosis not present

## 2017-08-31 DIAGNOSIS — I480 Paroxysmal atrial fibrillation: Secondary | ICD-10-CM

## 2017-08-31 DIAGNOSIS — I4892 Unspecified atrial flutter: Secondary | ICD-10-CM | POA: Diagnosis not present

## 2017-08-31 DIAGNOSIS — A419 Sepsis, unspecified organism: Secondary | ICD-10-CM | POA: Diagnosis not present

## 2017-08-31 DIAGNOSIS — I5032 Chronic diastolic (congestive) heart failure: Secondary | ICD-10-CM | POA: Diagnosis not present

## 2017-08-31 DIAGNOSIS — R0602 Shortness of breath: Secondary | ICD-10-CM | POA: Diagnosis not present

## 2017-08-31 DIAGNOSIS — R269 Unspecified abnormalities of gait and mobility: Secondary | ICD-10-CM | POA: Diagnosis not present

## 2017-08-31 DIAGNOSIS — S83509A Sprain of unspecified cruciate ligament of unspecified knee, initial encounter: Secondary | ICD-10-CM | POA: Diagnosis not present

## 2017-08-31 LAB — POCT INR: INR: 3.2 — AB (ref 2.0–3.0)

## 2017-08-31 NOTE — Progress Notes (Signed)
Came back out again today as pt received the alyq today so pill box was refilled. Will come back out next Wednesday.  [facility] today.  Ferrous sulfate is in on fri/sun/tues this week.  She has opsumit in through tues.  Called to order opsumit and it should be delivered Friday.  Will come back out on Tuesday for next visit.    Marylouise Stacks, EMT-Paramedic  08/31/17

## 2017-08-31 NOTE — Progress Notes (Signed)
Came out today to adjust coumadin dosing per INR clinic today. No coumadin on wednesdays and half tab of 2mg  on other days.  She is due for her alyq to be delivered tomor and I will come back then to do refill pill box.    Marylouise Stacks, EMT-Paramedic  08/31/17

## 2017-08-31 NOTE — Patient Instructions (Signed)
Description   Decrease dose to 1/2 tablet daily except no warfarin on Wednesdays.  Repeat INR in 2 weeks.  EMS - please call Natasha Fletcher/Natasha Fletcher at [phone] regarding her warfarin.  We will need to switch to 2 mg tablets in the near future, but would like to use her current supply of 4 mg tablets until gone.  Thank you.

## 2017-09-02 DIAGNOSIS — I272 Pulmonary hypertension, unspecified: Secondary | ICD-10-CM | POA: Diagnosis not present

## 2017-09-02 DIAGNOSIS — I130 Hypertensive heart and chronic kidney disease with heart failure and stage 1 through stage 4 chronic kidney disease, or unspecified chronic kidney disease: Secondary | ICD-10-CM | POA: Diagnosis not present

## 2017-09-02 DIAGNOSIS — N183 Chronic kidney disease, stage 3 (moderate): Secondary | ICD-10-CM | POA: Diagnosis not present

## 2017-09-02 DIAGNOSIS — I5032 Chronic diastolic (congestive) heart failure: Secondary | ICD-10-CM | POA: Diagnosis not present

## 2017-09-02 DIAGNOSIS — Z7901 Long term (current) use of anticoagulants: Secondary | ICD-10-CM | POA: Diagnosis not present

## 2017-09-02 DIAGNOSIS — E1122 Type 2 diabetes mellitus with diabetic chronic kidney disease: Secondary | ICD-10-CM | POA: Diagnosis not present

## 2017-09-02 DIAGNOSIS — I480 Paroxysmal atrial fibrillation: Secondary | ICD-10-CM | POA: Diagnosis not present

## 2017-09-02 DIAGNOSIS — R69 Illness, unspecified: Secondary | ICD-10-CM | POA: Diagnosis not present

## 2017-09-06 DIAGNOSIS — I480 Paroxysmal atrial fibrillation: Secondary | ICD-10-CM | POA: Diagnosis not present

## 2017-09-06 DIAGNOSIS — I130 Hypertensive heart and chronic kidney disease with heart failure and stage 1 through stage 4 chronic kidney disease, or unspecified chronic kidney disease: Secondary | ICD-10-CM | POA: Diagnosis not present

## 2017-09-06 DIAGNOSIS — E1122 Type 2 diabetes mellitus with diabetic chronic kidney disease: Secondary | ICD-10-CM | POA: Diagnosis not present

## 2017-09-06 DIAGNOSIS — I272 Pulmonary hypertension, unspecified: Secondary | ICD-10-CM | POA: Diagnosis not present

## 2017-09-06 DIAGNOSIS — R69 Illness, unspecified: Secondary | ICD-10-CM | POA: Diagnosis not present

## 2017-09-06 DIAGNOSIS — Z9981 Dependence on supplemental oxygen: Secondary | ICD-10-CM | POA: Diagnosis not present

## 2017-09-06 DIAGNOSIS — N183 Chronic kidney disease, stage 3 (moderate): Secondary | ICD-10-CM | POA: Diagnosis not present

## 2017-09-06 DIAGNOSIS — I5032 Chronic diastolic (congestive) heart failure: Secondary | ICD-10-CM | POA: Diagnosis not present

## 2017-09-07 ENCOUNTER — Other Ambulatory Visit (HOSPITAL_COMMUNITY): Payer: Self-pay

## 2017-09-07 ENCOUNTER — Other Ambulatory Visit (HOSPITAL_COMMUNITY): Payer: Self-pay | Admitting: *Deleted

## 2017-09-07 DIAGNOSIS — D485 Neoplasm of uncertain behavior of skin: Secondary | ICD-10-CM | POA: Diagnosis not present

## 2017-09-07 DIAGNOSIS — M1711 Unilateral primary osteoarthritis, right knee: Secondary | ICD-10-CM | POA: Diagnosis not present

## 2017-09-07 DIAGNOSIS — R0602 Shortness of breath: Secondary | ICD-10-CM | POA: Diagnosis not present

## 2017-09-07 MED ORDER — MACITENTAN 10 MG PO TABS: 10.0000 mg | ORAL_TABLET | Freq: Every day | ORAL | 11 refills | Status: AC

## 2017-09-07 MED ORDER — TADALAFIL (PAH) 20 MG PO TABS: 40.0000 mg | ORAL_TABLET | Freq: Every day | ORAL | 11 refills | Status: DC

## 2017-09-07 NOTE — Progress Notes (Signed)
Paramedicine Encounter    Patient ID: Natasha Fletcher, female    DOB: 02-24-1926, 84 y.o.   MRN: [id number]   Patient Care Team: Lorene Dy, MD as PCP - General (Internal Medicine) Troy Sine, MD as PCP - Cardiology (Cardiology) Constance Haw, MD as Consulting Physician (Cardiology)  Patient Active Problem List   Diagnosis Date Noted  . Cognitive decline   . Palliative care by specialist   . DNR (do not resuscitate) discussion   . Acute on chronic congestive heart failure ([place])   . Acute on chronic diastolic CHF (congestive heart failure) ([place])   . Sick sinus syndrome due to SA node dysfunction (HCC)   . Shortness of breath 06/13/2017  . Atrial fibrillation ([place]) 03/22/2016  . Bradycardia 08/22/2015  . Junctional (nodal) bradycardia 08/22/2015  . Malnutrition of moderate degree 03/19/2015  . Benign essential HTN   . Bradycardia, drug induced 03/18/2015  . Hypokalemia 02/16/2015  . Thrombocytopenia ([place]) 02/16/2015  . Sepsis due to urinary tract infection ()   . Sepsis ([place]) 02/12/2015  . Urinary tract infectious disease   . Long-term (current) use of anticoagulants 07/12/2014  . Chronic anticoagulation 02/11/2014  . Hypertensive cardiovascular disease 01/24/2014  . Chronic diastolic CHF (congestive heart failure) ([place]) 01/24/2014  . Anemia 01/24/2014  . Breast cancer of upper-inner quadrant of right female breast ([place]) 01/24/2014  . Acute diastolic heart failure ([place]) 11/11/2013  . Atrial flutter ([place]) 11/11/2013  . PAF (paroxysmal atrial fibrillation) ([place]) 11/10/2013  . Slurred speech 11/10/2013  . Protein-calorie malnutrition, severe ([place]) 11/06/2013  . Coffee ground emesis 11/05/2013  . Dehydration 10/19/2013  . Urinary retention 10/18/2013  . GERD (gastroesophageal reflux disease) 03/20/2013  . S/P total knee arthroplasty   . UTI (urinary tract infection) 02/21/2013  . OA (osteoarthritis) of knee 02/19/2013  . Hypertension   . Thyroid disease    . Cancer of central portion of female breast ([place]) 04/14/2011    Current Outpatient Medications:  .  amiodarone (PACERONE) 200 MG tablet, Take 0.5 tablets (100 mg total) by mouth daily., Disp: 45 tablet, Rfl: 2 .  carvedilol (COREG) 3.125 MG tablet, Take 1 tablet (3.125 mg total) by mouth 2 (two) times daily with a meal., Disp: 30 tablet, Rfl: 3 .  donepezil (ARICEPT) 5 MG tablet, Take 1 tablet (5 mg total) by mouth at bedtime., Disp: 30 tablet, Rfl: 0 .  ferrous sulfate 325 (65 FE) MG tablet, Take 1 tablet (325 mg total) by mouth every other day., Disp: 30 tablet, Rfl: 3 .  furosemide (LASIX) 80 MG tablet, Take 80mg  in the AM and 40mg  in the PM, Disp: 30 tablet, Rfl:  .  HYDROcodone-acetaminophen (NORCO) 7.5-325 MG tablet, Take 1 tablet by mouth every 6 (six) hours as needed for moderate pain., Disp: 30 tablet, Rfl: 0 .  latanoprost (XALATAN) 0.005 % ophthalmic solution, Place 1 drop into both eyes at bedtime., Disp: , Rfl:  .  Macitentan (OPSUMIT) 10 MG TABS, Take 1 tablet (10 mg total) by mouth daily., Disp: 30 tablet, Rfl: 11 .  oxybutynin (DITROPAN) 5 MG tablet, Take 5 mg by mouth 3 (three) times daily., Disp: , Rfl:  .  polyethylene glycol (MIRALAX / GLYCOLAX) packet, Take 17 g by mouth daily., Disp: 14 each, Rfl: 0 .  potassium chloride SA (K-DUR,KLOR-CON) 20 MEQ tablet, Take 1 tablet (20 mEq total) by mouth daily., Disp: 90 tablet, Rfl: 3 .  sertraline (ZOLOFT) 25 MG tablet, Take 1 tablet (25 mg total) by  mouth daily., Disp: 30 tablet, Rfl: 0 .  tadalafil, PAH, (ADCIRCA) 20 MG tablet, Take 2 tablets (40 mg total) by mouth daily., Disp: 60 tablet, Rfl: 11 .  warfarin (COUMADIN) 4 MG tablet, TAKE 1/2 TO 1 TABLET BY MOUTH DAILY AS DIRECTED BY COUMADIN CLINIC, Disp: 30 tablet, Rfl: 2 .  traZODone (DESYREL) 50 MG tablet, Take 50 mg by mouth at bedtime., Disp: , Rfl: 2 Allergies  Allergen Reactions  . Lactose Intolerance (Gi) Other (See Comments)    Stomach pain  . Milk-Related Compounds  Other (See Comments)    Upset stomach   . Sulfa Antibiotics Itching      Social History   Socioeconomic History  . Marital status: Single    Spouse name: Not on file  . Number of children: Not on file  . Years of education: Not on file  . Highest education level: Not on file  Occupational History  . Not on file  Social Needs  . Financial resource strain: Not on file  . Food insecurity:    Worry: Not on file    Inability: Not on file  . Transportation needs:    Medical: Not on file    Non-medical: Not on file  Tobacco Use  . Smoking status: Former Smoker    Packs/day: 0.00    Years: 30.00    Pack years: 0.00    Types: Cigarettes    Last attempt to quit: 04/21/1975    Years since quitting: 42.4  . Smokeless tobacco: Never Used  Substance and Sexual Activity  . Alcohol use: Yes    Comment: occasionally  . Drug use: No  . Sexual activity: Not Currently  Lifestyle  . Physical activity:    Days per week: Not on file    Minutes per session: Not on file  . Stress: Not on file  Relationships  . Social connections:    Talks on phone: Not on file    Gets together: Not on file    Attends religious service: Not on file    Active member of club or organization: Not on file    Attends meetings of clubs or organizations: Not on file    Relationship status: Not on file  . Intimate partner violence:    Fear of current or ex partner: Not on file    Emotionally abused: Not on file    Physically abused: Not on file    Forced sexual activity: Not on file  Other Topics Concern  . Not on file  Social History Narrative   From SNF, getting PT and using cane.      Physical Exam      Future Appointments  Date Time Provider [place]  09/13/2017  8:30 AM [facility]  09/13/2017 11:00 AM Narda Amber K, DO [facility] None  09/13/2017 12:45 PM Narda Amber K, DO [facility] None  10/20/2017 10:00 AM Larey Dresser, MD [facility] None   11/10/2017  8:30 AM [facility]  12/21/2017 10:40 AM Troy Sine, MD [facility]    BP 108/60   Pulse 62   Resp 14   SpO2 98%   Weight yesterday-141 Last visit weight-136  Pt reports she is not feeling good today. She is in a lot of pain all over from her arthritis. She is wearing her 02.  Her sob when exertion has improved since the 02.  [facility] still [facility] the new dose of adcirca/alyq.  She has ferrous sulfate in thurs/sat/mon/wed for next week.  Pt is going to see PCP today for the pain and to get more pain pills.  meds verified and pill box refilled.  Pt hadnt taken her meds yet this morning, she hasnt eaten either so she took her meds during our visit and is going to fix her some food after I leave.   Marylouise Stacks, [facility] Paramedic  09/07/17

## 2017-09-13 ENCOUNTER — Ambulatory Visit (INDEPENDENT_AMBULATORY_CARE_PROVIDER_SITE_OTHER): Payer: Medicare HMO | Admitting: Pharmacist Clinician (PhC)/ Clinical Pharmacy Specialist

## 2017-09-13 ENCOUNTER — Encounter: Payer: Medicare HMO | Admitting: Neurology

## 2017-09-13 ENCOUNTER — Ambulatory Visit: Payer: Medicare HMO | Admitting: Neurology

## 2017-09-13 DIAGNOSIS — I480 Paroxysmal atrial fibrillation: Secondary | ICD-10-CM | POA: Diagnosis not present

## 2017-09-13 DIAGNOSIS — Z7901 Long term (current) use of anticoagulants: Secondary | ICD-10-CM

## 2017-09-13 LAB — POCT INR: INR: 2.3 (ref 2.0–3.0)

## 2017-09-13 NOTE — Patient Instructions (Signed)
Description   Continue with 1/2 tablet daily except no warfarin on Wednesdays.  Repeat INR in 3 weeks.

## 2017-09-14 ENCOUNTER — Telehealth (HOSPITAL_COMMUNITY): Payer: Self-pay | Admitting: Pharmacist

## 2017-09-14 ENCOUNTER — Telehealth (HOSPITAL_COMMUNITY): Payer: Self-pay | Admitting: *Deleted

## 2017-09-14 ENCOUNTER — Other Ambulatory Visit (HOSPITAL_COMMUNITY): Payer: Self-pay

## 2017-09-14 NOTE — Telephone Encounter (Signed)
Katie aware and agreeable with plan .

## 2017-09-14 NOTE — Telephone Encounter (Signed)
Hold Coreg for the day.  Call back if BP continues to run low.

## 2017-09-14 NOTE — Progress Notes (Signed)
Came by for med rec--per clinic she is to hold a days dose of carvedilol. That was adjusted in pts pill box.   Marylouise Stacks, EMT-Paramedic  09/14/17

## 2017-09-14 NOTE — Telephone Encounter (Signed)
Natasha Fletcher called to report pts bp 88/50 patient states she does not feel well just really tired. Patient had taken all of her meds this AM. Her bp normally runs 100's/60's. Message routed to [place] for advice.

## 2017-09-14 NOTE — Progress Notes (Signed)
Paramedicine Encounter    Patient ID: Natasha Fletcher, female    DOB: 1925/04/28, 84 y.o.   MRN: [id number]   Patient Care Team: Lorene Dy, MD as PCP - General (Internal Medicine) Troy Sine, MD as PCP - Cardiology (Cardiology) Constance Haw, MD as Consulting Physician (Cardiology)  Patient Active Problem List   Diagnosis Date Noted  . Cognitive decline   . Palliative care by specialist   . DNR (do not resuscitate) discussion   . Acute on chronic congestive heart failure ([place])   . Acute on chronic diastolic CHF (congestive heart failure) ([place])   . Sick sinus syndrome due to SA node dysfunction (HCC)   . Shortness of breath 06/13/2017  . Atrial fibrillation ([place]) 03/22/2016  . Bradycardia 08/22/2015  . Junctional (nodal) bradycardia 08/22/2015  . Malnutrition of moderate degree 03/19/2015  . Benign essential HTN   . Bradycardia, drug induced 03/18/2015  . Hypokalemia 02/16/2015  . Thrombocytopenia ([place]) 02/16/2015  . Sepsis due to urinary tract infection ([place])   . Sepsis ([place]) 02/12/2015  . Urinary tract infectious disease   . Long-term (current) use of anticoagulants 07/12/2014  . Chronic anticoagulation 02/11/2014  . Hypertensive cardiovascular disease 01/24/2014  . Chronic diastolic CHF (congestive heart failure) ([place]) 01/24/2014  . Anemia 01/24/2014  . Breast cancer of upper-inner quadrant of right female breast ([place]) 01/24/2014  . Acute diastolic heart failure ([place]) 11/11/2013  . Atrial flutter ([place]) 11/11/2013  . PAF (paroxysmal atrial fibrillation) ([place]) 11/10/2013  . Slurred speech 11/10/2013  . Protein-calorie malnutrition, severe ([place]) 11/06/2013  . Coffee ground emesis 11/05/2013  . Dehydration 10/19/2013  . Urinary retention 10/18/2013  . GERD (gastroesophageal reflux disease) 03/20/2013  . S/P total knee arthroplasty   . UTI (urinary tract infection) 02/21/2013  . OA (osteoarthritis) of knee 02/19/2013  . Hypertension   . Thyroid disease    . Cancer of central portion of female breast ([place]) 04/14/2011    Current Outpatient Medications:  .  amiodarone (PACERONE) 200 MG tablet, Take 0.5 tablets (100 mg total) by mouth daily., Disp: 45 tablet, Rfl: 2 .  carvedilol (COREG) 3.125 MG tablet, Take 1 tablet (3.125 mg total) by mouth 2 (two) times daily with a meal., Disp: 30 tablet, Rfl: 3 .  donepezil (ARICEPT) 5 MG tablet, Take 1 tablet (5 mg total) by mouth at bedtime., Disp: 30 tablet, Rfl: 0 .  ferrous sulfate 325 (65 FE) MG tablet, Take 1 tablet (325 mg total) by mouth every other day., Disp: 30 tablet, Rfl: 3 .  furosemide (LASIX) 80 MG tablet, Take 80mg  in the AM and 40mg  in the PM, Disp: 30 tablet, Rfl:  .  HYDROcodone-acetaminophen (NORCO) 7.5-325 MG tablet, Take 1 tablet by mouth every 6 (six) hours as needed for moderate pain., Disp: 30 tablet, Rfl: 0 .  latanoprost (XALATAN) 0.005 % ophthalmic solution, Place 1 drop into both eyes at bedtime., Disp: , Rfl:  .  macitentan (OPSUMIT) 10 MG tablet, Take 1 tablet (10 mg total) by mouth daily., Disp: 30 tablet, Rfl: 11 .  oxybutynin (DITROPAN) 5 MG tablet, Take 5 mg by mouth 3 (three) times daily., Disp: , Rfl:  .  polyethylene glycol (MIRALAX / GLYCOLAX) packet, Take 17 g by mouth daily., Disp: 14 each, Rfl: 0 .  potassium chloride SA (K-DUR,KLOR-CON) 20 MEQ tablet, Take 1 tablet (20 mEq total) by mouth daily., Disp: 90 tablet, Rfl: 3 .  sertraline (ZOLOFT) 25 MG tablet, Take 1 tablet (25 mg total) by  mouth daily., Disp: 30 tablet, Rfl: 0 .  tadalafil, PAH, (ADCIRCA) 20 MG tablet, Take 2 tablets (40 mg total) by mouth daily., Disp: 60 tablet, Rfl: 11 .  traZODone (DESYREL) 50 MG tablet, Take 50 mg by mouth at bedtime., Disp: , Rfl: 2 .  warfarin (COUMADIN) 4 MG tablet, TAKE 1/2 TO 1 TABLET BY MOUTH DAILY AS DIRECTED BY COUMADIN CLINIC, Disp: 30 tablet, Rfl: 2 Allergies  Allergen Reactions  . Lactose Intolerance (Gi) Other (See Comments)    Stomach pain  . Milk-Related Compounds  Other (See Comments)    Upset stomach   . Sulfa Antibiotics Itching      Social History   Socioeconomic History  . Marital status: Single    Spouse name: Not on file  . Number of children: Not on file  . Years of education: Not on file  . Highest education level: Not on file  Occupational History  . Not on file  Social Needs  . Financial resource strain: Not on file  . Food insecurity:    Worry: Not on file    Inability: Not on file  . Transportation needs:    Medical: Not on file    Non-medical: Not on file  Tobacco Use  . Smoking status: Former Smoker    Packs/day: 0.00    Years: 30.00    Pack years: 0.00    Types: Cigarettes    Last attempt to quit: 04/21/1975    Years since quitting: 42.4  . Smokeless tobacco: Never Used  Substance and Sexual Activity  . Alcohol use: Yes    Comment: occasionally  . Drug use: No  . Sexual activity: Not Currently  Lifestyle  . Physical activity:    Days per week: Not on file    Minutes per session: Not on file  . Stress: Not on file  Relationships  . Social connections:    Talks on phone: Not on file    Gets together: Not on file    Attends religious service: Not on file    Active member of club or organization: Not on file    Attends meetings of clubs or organizations: Not on file    Relationship status: Not on file  . Intimate partner violence:    Fear of current or ex partner: Not on file    Emotionally abused: Not on file    Physically abused: Not on file    Forced sexual activity: Not on file  Other Topics Concern  . Not on file  Social History Narrative   From SNF, getting PT and using cane.      Physical Exam      Future Appointments  Date Time Provider [place]  09/20/2017 12:45 PM Narda Amber K, DO [facility] None  09/20/2017  1:30 PM Narda Amber K, DO [facility] None  10/04/2017  8:30 AM [facility]  10/20/2017 10:00 AM Larey Dresser, MD [facility] None   11/10/2017  8:30 AM [facility]  12/21/2017 10:40 AM Troy Sine, MD [facility]    BP (!) 88/50   Pulse 62   Resp 14   SpO2 99%   B/p standing-94/52  Weight yesterday-139 Last visit weight-141  Pt reports she is not feeling good today, she reports drinking ensure this morning, took her meds, and just feels sob after doing minimal activities around the house. She was not able to weigh this morning. She has frequent episodes of feeling  this sob around the house.  The ferrous sulfate is in fri/sun/tues this week.  No bleeding issues, no h/a. Some dizziness.  She is out of her ALYQ, so I called lisa, pharmacist to see if she can send over the correct dose as the [facility] has her still on 20mg .  She has it in for tomor and Friday and I will see if zack or dee can come see her Friday if she has the medication in.  meds verified and pill box refilled.  She took her meds this morning around 730.  I contacted clinic and she will send message to dr Aundra Dubin for further direction and she will call me back.    Marylouise Stacks, [place] Paramedic  09/14/17

## 2017-09-15 ENCOUNTER — Other Ambulatory Visit (HOSPITAL_COMMUNITY): Payer: Self-pay | Admitting: Cardiology

## 2017-09-15 DIAGNOSIS — N183 Chronic kidney disease, stage 3 (moderate): Secondary | ICD-10-CM | POA: Diagnosis not present

## 2017-09-15 DIAGNOSIS — I480 Paroxysmal atrial fibrillation: Secondary | ICD-10-CM | POA: Diagnosis not present

## 2017-09-15 DIAGNOSIS — I130 Hypertensive heart and chronic kidney disease with heart failure and stage 1 through stage 4 chronic kidney disease, or unspecified chronic kidney disease: Secondary | ICD-10-CM | POA: Diagnosis not present

## 2017-09-15 DIAGNOSIS — E1122 Type 2 diabetes mellitus with diabetic chronic kidney disease: Secondary | ICD-10-CM | POA: Diagnosis not present

## 2017-09-15 DIAGNOSIS — Z9981 Dependence on supplemental oxygen: Secondary | ICD-10-CM | POA: Diagnosis not present

## 2017-09-15 DIAGNOSIS — R69 Illness, unspecified: Secondary | ICD-10-CM | POA: Diagnosis not present

## 2017-09-15 DIAGNOSIS — I5032 Chronic diastolic (congestive) heart failure: Secondary | ICD-10-CM | POA: Diagnosis not present

## 2017-09-15 DIAGNOSIS — I272 Pulmonary hypertension, unspecified: Secondary | ICD-10-CM | POA: Diagnosis not present

## 2017-09-15 MED ORDER — ALBUTEROL SULFATE HFA 108 (90 BASE) MCG/ACT IN AERS: 2.0000 | INHALATION_SPRAY | Freq: Four times a day (QID) | RESPIRATORY_TRACT | 2 refills | Status: DC | PRN

## 2017-09-15 NOTE — Telephone Encounter (Signed)
Patient called to request a refill on an albuterol inhaler, report she was advised by her [place] phone nurse call. Patient has been experiencing increased SOB Advised we would not normally manage continued refills for albuterol however I would supply a 30 day supply until she could get in with PCP for further evaluation as she has used in the past for SOB/wheezing. She has difficulty with transportation

## 2017-09-16 DIAGNOSIS — I480 Paroxysmal atrial fibrillation: Secondary | ICD-10-CM | POA: Diagnosis not present

## 2017-09-16 DIAGNOSIS — N183 Chronic kidney disease, stage 3 (moderate): Secondary | ICD-10-CM | POA: Diagnosis not present

## 2017-09-16 DIAGNOSIS — I5032 Chronic diastolic (congestive) heart failure: Secondary | ICD-10-CM | POA: Diagnosis not present

## 2017-09-16 DIAGNOSIS — I272 Pulmonary hypertension, unspecified: Secondary | ICD-10-CM | POA: Diagnosis not present

## 2017-09-16 DIAGNOSIS — E1122 Type 2 diabetes mellitus with diabetic chronic kidney disease: Secondary | ICD-10-CM | POA: Diagnosis not present

## 2017-09-16 DIAGNOSIS — R69 Illness, unspecified: Secondary | ICD-10-CM | POA: Diagnosis not present

## 2017-09-16 DIAGNOSIS — I130 Hypertensive heart and chronic kidney disease with heart failure and stage 1 through stage 4 chronic kidney disease, or unspecified chronic kidney disease: Secondary | ICD-10-CM | POA: Diagnosis not present

## 2017-09-16 DIAGNOSIS — Z9981 Dependence on supplemental oxygen: Secondary | ICD-10-CM | POA: Diagnosis not present

## 2017-09-19 NOTE — Telephone Encounter (Signed)
Called pharmacy to proved information for increased tadalfil dose  They approved and will send to patient

## 2017-09-20 ENCOUNTER — Ambulatory Visit: Payer: Medicare HMO | Admitting: Neurology

## 2017-09-20 ENCOUNTER — Encounter: Payer: Self-pay | Admitting: Neurology

## 2017-09-20 ENCOUNTER — Ambulatory Visit (INDEPENDENT_AMBULATORY_CARE_PROVIDER_SITE_OTHER): Payer: Medicare HMO | Admitting: Neurology

## 2017-09-20 VITALS — BP 100/60 | HR 95 | Ht 64.0 in | Wt 140.2 lb

## 2017-09-20 DIAGNOSIS — G63 Polyneuropathy in diseases classified elsewhere: Secondary | ICD-10-CM | POA: Diagnosis not present

## 2017-09-20 DIAGNOSIS — E859 Amyloidosis, unspecified: Secondary | ICD-10-CM

## 2017-09-20 DIAGNOSIS — I480 Paroxysmal atrial fibrillation: Secondary | ICD-10-CM | POA: Diagnosis not present

## 2017-09-20 DIAGNOSIS — E851 Neuropathic heredofamilial amyloidosis: Secondary | ICD-10-CM

## 2017-09-20 DIAGNOSIS — Z9981 Dependence on supplemental oxygen: Secondary | ICD-10-CM | POA: Diagnosis not present

## 2017-09-20 DIAGNOSIS — I130 Hypertensive heart and chronic kidney disease with heart failure and stage 1 through stage 4 chronic kidney disease, or unspecified chronic kidney disease: Secondary | ICD-10-CM | POA: Diagnosis not present

## 2017-09-20 DIAGNOSIS — M5412 Radiculopathy, cervical region: Secondary | ICD-10-CM

## 2017-09-20 DIAGNOSIS — N183 Chronic kidney disease, stage 3 (moderate): Secondary | ICD-10-CM | POA: Diagnosis not present

## 2017-09-20 DIAGNOSIS — E1122 Type 2 diabetes mellitus with diabetic chronic kidney disease: Secondary | ICD-10-CM | POA: Diagnosis not present

## 2017-09-20 DIAGNOSIS — R69 Illness, unspecified: Secondary | ICD-10-CM | POA: Diagnosis not present

## 2017-09-20 DIAGNOSIS — E852 Heredofamilial amyloidosis, unspecified: Secondary | ICD-10-CM | POA: Diagnosis not present

## 2017-09-20 DIAGNOSIS — I272 Pulmonary hypertension, unspecified: Secondary | ICD-10-CM | POA: Diagnosis not present

## 2017-09-20 DIAGNOSIS — I5032 Chronic diastolic (congestive) heart failure: Secondary | ICD-10-CM | POA: Diagnosis not present

## 2017-09-20 NOTE — Procedures (Signed)
[facility]  [address]  [place], [place] [address] Tel: [phone] Fax:  [phone] Test Date:  09/20/2017  Patient: Natasha Fletcher DOB: [birth year] Physician: Narda Amber, DO  Sex: Female Height: 5\' 4"  Ref Phys: Narda Amber, DO  ID#: [id number] Temp: 35.2C Technician:    Patient Complaints: This is an 84 year old female with hereditary transthyretin amyloidosis referred for evaluation of paresthesias in the hands and feet.  NCV & EMG Findings: Extensive electrodiagnostic testing of the left upper and lower extremity shows:  1. Left median, sural, and superficial peroneal sensory responses are absent. Left ulnar and radial sensory responses show prolonged latency (L3.7, L3.0 ms) and normal amplitude. 2. Left median motor response shows prolonged latency (4.8 ms) and normal amplitude.  Left ulnar motor responses within normal limits. Left tibial and peroneal (EDB) motor responses are absent. The peroneal motor response at the tibialis anterior is within normal limits.  3. Chronic motor axon loss changes are seen affecting the muscles in the lower extremity, as well as distal hand muscles.  Additionally, there is chronic neurogenic changes in the biceps and deltoid muscles. There is no evidence of active denervation in any of the tested muscles.  Impression: 1. The electrophysiologic findings are most consistent with a chronic and length-dependent sensorimotor polyneuropathy, axon loss and demyelinating in type, affecting the left side. Overall, these findings are moderate to severe in degree electrically. 2. Superimposed chronic C5-6 radiculopathy affecting the left upper extremity, mild in degree electrically.     ___________________________ Narda Amber, DO    Nerve Conduction Studies Anti Sensory Summary Table   Site NR Peak (ms) Norm Peak (ms) P-T Amp (V) Norm P-T Amp  Left Median Anti Sensory (2nd Digit)  Wrist NR  <3.8  >10  Left Radial Anti  Sensory (Base 1st Digit)  Wrist    3.0 <2.8 15.4 >10  Left Sup Peroneal Anti Sensory (Ant Lat Mall)  12 cm NR  <4.6  >3  Left Sural Anti Sensory (Lat Mall)  Calf NR  <4.6  >3  Left Ulnar Anti Sensory (5th Digit)  Wrist    3.7 <3.2 16.3 >5   Motor Summary Table   Site NR Onset (ms) Norm Onset (ms) O-P Amp (mV) Norm O-P Amp Site1 Site2 Delta-0 (ms) Dist (cm) Vel (m/s) Norm Vel (m/s)  Left Median Motor (Abd Poll Brev)  Wrist    4.8 <4.0 7.5 >5 Elbow Wrist 6.3 32.0 51 >50  Elbow    11.1  6.4         Left Peroneal Motor (Ext Dig Brev)  Ankle NR  <6.0  >2.5 B Fib Ankle  0.0  >40  B Fib NR     Poplt B Fib  0.0  >40  Poplt NR            Left Peroneal TA Motor (Tib Ant)  Fib Head    3.9 <4.5 3.9 >3 Poplit Fib Head 1.5 8.0 53 >40  Poplit    5.4  3.7         Left Tibial Motor (Abd Hall Brev)  Ankle NR  <6.0  >4 Knee Ankle  0.0  >40  Knee NR            Left Ulnar Motor (Abd Dig [place])  Wrist    2.7 <3.1 7.0 >7 B Elbow Wrist 4.3 23.0 53 >50  B Elbow    7.0  6.4  A Elbow B Elbow 1.8 10.0 56 >50  A Elbow    8.8  6.2          EMG   Side Muscle Ins Act Fibs Psw Fasc Number Recrt Dur Dur. Amp Amp. Poly Poly. Comment  Left AntTibialis Nml Nml Nml Nml 1- Rapid Some 1+ Some 1+ Nml Nml N/A  Left Gastroc Nml Nml Nml Nml 1- Rapid Some 1+ Some 1+ Nml Nml N/A  Left RectFemoris Nml Nml Nml Nml 1- Rapid Few 1+ Few 1+ Nml Nml N/A  Left 1stDorInt Nml Nml Nml Nml 1- Rapid Some 1+ Some 1+ Nml Nml N/A  Left Abd Poll Brev Nml Nml Nml Nml 1- Rapid Some 1+ Some 1+ Nml Nml N/A  Left Ext Indicis Nml Nml Nml Nml Nml Nml Nml Nml Nml Nml Nml Nml N/A  Left PronatorTeres Nml Nml Nml Nml Nml Nml Nml Nml Nml Nml Nml Nml N/A  Left Biceps Nml Nml Nml Nml 1- Rapid Some 1+ Some 1+ Nml Nml N/A  Left Deltoid Nml Nml Nml Nml 1- Rapid Some 1+ Some 1+ Nml Nml N/A  Left Triceps Nml Nml Nml Nml Nml Nml Nml Nml Nml Nml Nml Nml N/A      Waveforms:

## 2017-09-20 NOTE — Progress Notes (Signed)
[facility] Clinic Note - Initial Visit   Date: 09/20/17  Natasha Fletcher MRN: [id number] DOB: January 05, 1926   Dear Dr. Aundra Dubin:  Thank you for your kind referral of [place] for consultation of neuropathy. Although her history is well known to you, please allow [place] to reiterate it for the purpose of our medical record. The patient was accompanied to the clinic by self.   History of Present Illness: Natasha Fletcher is a 84 y.o. right-handed [place] American female recently diagnosed with hereditary transthyretin amyloidosis presenting for evaluation of neuropathy.  Her medical comorbidities include stage 3 CKD, SSS with PPM, PAF, chronic diastolic heart failure, and pulmonary hypertension.   Since 2018, she started having exertional shortness of breath and her work-up showed severe LVH with severe pulmonary hypertension.  Because of the severity of her LVH, she underwent cardiac SPECT showed findings concerning for transthyrettin amyloidosis and genetic testing confirmed the presence of Val142Ile mutation.  She was diagnosed with hereditary ATTR amyloidosis and referred for neurological evaluation.  She lives alone in a one level apartment.  She does not have any children.  None of her siblings have cardiac disease or neuropathy.  Her mother lived to the age of 87 and father lived to the age of 38.  Starting early 2019, she began having spells of numbness of the feet and hands, worse on the left hand.  She denies painful paresthesias.  She has some imbalance and uses a cane at home, as needed, and wheelchair for long distances. She has not suffered any falls.  She has history of right CTS release in the early 2000s. She had difficulty opening jars and bottles.  She does not have weakness of the feet. She denies dry eyes or dry mouth.  She has noticed some lightheadedness, reduced sweating early satiety, as well as reduced appetite. She manages her own finances and  household chores. She primarily does microwave meals.  She has a home nurse who assists with weekly medication preparation.  She stopped driving in early 4098.    Out-side paper records, electronic medical record, and images have been reviewed where available and summarized as:  Lab Results  Component Value Date   TSH 0.854 07/01/2017   Lab Results  Component Value Date   VITAMINB12 473 06/14/2017   Lab Results  Component Value Date   HGBA1C 5.1 06/13/2017    Lab Results  Component Value Date   ALT 13 (L) 07/01/2017   AST 31 07/01/2017   ALKPHOS 90 07/01/2017   BILITOT 1.0 07/01/2017     Past Medical History:  Diagnosis Date  . Arthritis    "all over"  . Asthma   . Atrial fibrillation ([place])    a. s/p DCCV in 01/2014 b. repeat DCCV in 06/2015, started on Amiodarone at that time. On Coumadin for anticoagulation.  . Cancer ([place])    rt breast  . Chronic diastolic CHF (congestive heart failure) ([place])    a. 08/2015: Echo with EF of 60-65%, no WMA, LA mod dilated, mild TR.  . Diabetes mellitus without complication ([place])   . Edema leg   . Fast breathing    "because of pill"  . GERD (gastroesophageal reflux disease)   . Glaucoma   . Glaucoma   . Hypertension   . Incontinence   . Pneumonia    h/o younger  . S/P total knee arthroplasty   . SSS (sick sinus syndrome) (H[facility])    a. s/p Medtronic  Advisa DR MRI [id number] (serial number [id number]) PPM placement in 08/2015  . Swelling    bilateral feet/ legs, more left.  . Thyroid disease    "something years ago"  . UTI (urinary tract infection) 02/12/2015  . Wears dentures   . Wears glasses     Past Surgical History:  Procedure Laterality Date  . ABDOMINAL HYSTERECTOMY    . APPENDECTOMY    . BREAST BIOPSY  04/27/2011   Procedure: BREAST BIOPSY WITH NEEDLE LOCALIZATION;  Surgeon: Edward Jolly, MD;  Location: [facility];  Service: General;  Laterality: Right;  right needle localized breast lumpectomy    . BREAST LUMPECTOMY  04/27/11   right breast by [place]  . CARDIOVERSION N/A 02/01/2014   Procedure: CARDIOVERSION;  Surgeon: Candee Furbish, MD;  Location: [facility] ENDOSCOPY;  Service: Cardiovascular;  Laterality: N/A;  . CARDIOVERSION N/A 06/04/2015   Procedure: CARDIOVERSION;  Surgeon: Dorothy Spark, MD;  Location[place];  Service: Cardiovascular;  Laterality: N/A;  . CARDIOVERSION N/A 07/14/2015   Procedure: CARDIOVERSION;  Surgeon: Thayer Headings, MD;  Location: [facility] ENDOSCOPY;  Service: Cardiovascular;  Laterality: N/A;  . CARPAL TUNNEL RELEASE Bilateral   . COLONOSCOPY    . EP IMPLANTABLE DEVICE N/A 08/22/2015   Procedure: Pacemaker Implant;  Surgeon: Will Meredith Leeds, MD;  Medtronic Advisa DR MRI [phone] [id number] (serial number [id number]);  Laterality: N/A;  . EYE SURGERY     both cataracts  . RIGHT HEART CATH N/A 04/21/2017   Procedure: RIGHT HEART CATH;  Surgeon: Larey Dresser, MD;  Location: [facility];  Service: Cardiovascular;  Laterality: N/A;  . SHOULDER SURGERY Left   . TEE WITHOUT CARDIOVERSION N/A 02/01/2014   Procedure: TRANSESOPHAGEAL ECHOCARDIOGRAM (TEE);  Surgeon: Candee Furbish, MD;  Location: [facility] ENDOSCOPY;  Service: Cardiovascular;  Laterality: N/A;  . TONSILLECTOMY    . TOTAL HIP ARTHROPLASTY Bilateral   . TOTAL KNEE ARTHROPLASTY Right 02/19/2013   Procedure: RIGHT TOTAL KNEE ARTHROPLASTY;  Surgeon: Gearlean Alf, MD;  Location: [facility];  Service: Orthopedics;  Laterality: Right;  . TOTAL KNEE ARTHROPLASTY Left 10/15/2013   Procedure: LEFT TOTAL KNEE ARTHROPLASTY;  Surgeon: Gearlean Alf, MD;  Location: [facility];  Service: Orthopedics;  Laterality: Left;     Medications:  Outpatient Encounter Medications as of 09/20/2017  Medication Sig  . albuterol (PROVENTIL HFA;VENTOLIN HFA) 108 (90 Base) MCG/ACT inhaler Inhale 2 puffs into the lungs every 6 (six) hours as needed for wheezing or shortness of breath.  Marland Kitchen amiodarone (PACERONE) 200 MG tablet Take 0.5  tablets (100 mg total) by mouth daily.  . carvedilol (COREG) 3.125 MG tablet Take 1 tablet (3.125 mg total) by mouth 2 (two) times daily with a meal.  . donepezil (ARICEPT) 5 MG tablet Take 1 tablet (5 mg total) by mouth at bedtime.  . ferrous sulfate 325 (65 FE) MG tablet Take 1 tablet (325 mg total) by mouth every other day.  . furosemide (LASIX) 80 MG tablet Take 80mg  in the AM and 40mg  in the PM  . HYDROcodone-acetaminophen (NORCO) 7.5-325 MG tablet Take 1 tablet by mouth every 6 (six) hours as needed for moderate pain.  Marland Kitchen latanoprost (XALATAN) 0.005 % ophthalmic solution Place 1 drop into both eyes at bedtime.  Marland Kitchen LORazepam (ATIVAN) 1 MG tablet Take 1 mg by mouth at bedtime as needed.  . macitentan (OPSUMIT) 10 MG tablet Take 1 tablet (10 mg total) by mouth daily.  Marland Kitchen oxybutynin (  DITROPAN) 5 MG tablet Take 5 mg by mouth 3 (three) times daily.  . polyethylene glycol (MIRALAX / GLYCOLAX) packet Take 17 g by mouth daily.  . potassium chloride SA (K-DUR,KLOR-CON) 20 MEQ tablet Take 1 tablet (20 mEq total) by mouth daily.  . sertraline (ZOLOFT) 25 MG tablet Take 1 tablet (25 mg total) by mouth daily.  . tadalafil, PAH, (ADCIRCA) 20 MG tablet Take 2 tablets (40 mg total) by mouth daily.  . traZODone (DESYREL) 50 MG tablet Take 50 mg by mouth at bedtime.  Marland Kitchen warfarin (COUMADIN) 4 MG tablet TAKE 1/2 TO 1 TABLET BY MOUTH DAILY AS DIRECTED BY COUMADIN CLINIC   No facility-administered encounter medications on file as of 09/20/2017.      Allergies:  Allergies  Allergen Reactions  . Lactose Intolerance (Gi) Other (See Comments)    Stomach pain  . Milk-Related Compounds Other (See Comments)    Upset stomach   . Sulfa Antibiotics Itching    Family History: Family History  Problem Relation Age of Onset  . Heart disease Mother   . Stroke Mother   . Hypertension Mother   . Stroke Father   . Hypertension Father   . Stroke Brother   . Heart attack Neg Hx   . Neuropathy Neg Hx     Social  History: Social History   Tobacco Use  . Smoking status: Former Smoker    Packs/day: 0.00    Years: 30.00    Pack years: 0.00    Types: Cigarettes    Last attempt to quit: 04/21/1975    Years since quitting: 42.4  . Smokeless tobacco: Never Used  Substance Use Topics  . Alcohol use: Not Currently    Comment: occasionally  . Drug use: No   Social History   Social History Narrative   Lives alone.  Has a dog.  No children.  Retired from [employer].  Education: some college.     Review of Systems:  CONSTITUTIONAL: No fevers, chills, night sweats, or weight loss.   EYES: No visual changes or eye pain ENT: No hearing changes.  No history of nose bleeds.   RESPIRATORY: No cough, wheezing +shortness of breath.   CARDIOVASCULAR: Negative for chest pain, and palpitations.   GI: Negative for abdominal discomfort, blood in stools or black stools.  No recent change in bowel habits.   GU:  No history of incontinence.   MUSCLOSKELETAL: No history of joint pain +swelling.  No myalgias.   SKIN: Negative for lesions, rash, and itching.   HEMATOLOGY/ONCOLOGY: Negative for prolonged bleeding, bruising easily, and swollen nodes.  No history of cancer.   ENDOCRINE: Negative for cold or heat intolerance, polydipsia or goiter.   PSYCH:  No depression or anxiety symptoms.   NEURO: As Above.   Vital Signs:  BP 100/60   Pulse 95   Ht 5\' 4"  (1.626 m)   Wt 140 lb 4 oz (63.6 kg)   SpO2 96%   BMI 24.07 kg/m    General Medical Exam:   General:  Well appearing, comfortable, sitting in wheelchair.   Eyes/ENT: see cranial nerve examination.   Neck: No masses appreciated.  Full range of motion without tenderness.  No carotid bruits. Respiratory:  Clear to auscultation, good air entry bilaterally.   Cardiac:  Regular rate and rhythm, no murmur.   Extremities:  Pes planus bilaterally with eversion of the feet; there is 1+ ankle edema.   Skin:  No rashes or lesions.  Neurological  Exam: MENTAL STATUS including orientation to time, place, person, recent and remote memory, attention span and concentration, language, and fund of knowledge is fairly intact.  She tends to repeat the same questions. Speech is not dysarthric.  CRANIAL NERVES: II:  No visual field defects.  Unremarkable fundi.   III-IV-VI: Pupils equal round and reactive to light.  Normal conjugate, extra-ocular eye movements in all directions of gaze.  No nystagmus.  No ptosis.   V:  Normal facial sensation.     VII:  Normal facial symmetry and movements.   VIII:  Normal hearing and vestibular function.   IX-X:  Normal palatal movement.   XI:  Normal shoulder shrug and head rotation.   XII:  Normal tongue strength and range of motion, no deviation or fasciculation.  MOTOR:  No atrophy, fasciculations or abnormal movements.  No pronator drift.  Tone is normal.    Right Upper Extremity:    Left Upper Extremity:    Deltoid  5/5   Deltoid  5/5   Biceps  5/5   Biceps  5/5   Triceps  5/5   Triceps  5/5   Wrist extensors  5/5   Wrist extensors  5/5   Wrist flexors  5/5   Wrist flexors  5/5   Finger extensors  5/5   Finger extensors  5/5   Finger flexors  5/5   Finger flexors  5/5   Dorsal interossei  5/5   Dorsal interossei  5/5   Abductor pollicis  5/5   Abductor pollicis  5/5   Tone (Ashworth scale)  0  Tone (Ashworth scale)  0   Right Lower Extremity:    Left Lower Extremity:    Hip flexors  5/5   Hip flexors  5/5   Hip extensors  5/5   Hip extensors  5/5   Knee flexors  5/5   Knee flexors  5/5   Knee extensors  5/5   Knee extensors  5/5   Dorsiflexors  5/5   Dorsiflexors  5/5   Plantarflexors  5/5   Plantarflexors  5/5   Toe extensors  5-/5   Toe extensors  5-/5   Toe flexors  5-/5   Toe flexors  5-/5   Tone (Ashworth scale)  0  Tone (Ashworth scale)  0   MSRs:  Right                                                                 Left brachioradialis 2+  brachioradialis 2+  biceps 2+  biceps 2+   triceps 2+  triceps 2+  patellar 0  Patellar 0  ankle jerk 0  ankle jerk 0  Hoffman no  Hoffman no  plantar response mute  plantar response mute   SENSORY:  Vibration is trace at the knees, and absent distal to ankles bilaterally. There is hyperesthesia with pinprick and temperature over the dorsum of the feet and lower legs. Proprioception is impaired at great toe. Romberg's sign is positive. Sensation is intact in the upper extremities.  COORDINATION/GAIT: Normal finger-to- nose-finger.  Intact rapid alternating movements bilaterally. Gait is slow, unsteady, and unassisted.   IMPRESSION: 1.  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  Patient underwent electrodiagnostic testing today which confirmed  the presence of a length dependent and chronic sensorimotor polyneuropathy, which is consistent with her exam.  She also has symptoms of dysautonomia. Her polyneuropathy disability score (PND) is IIb. She does not have history of diabetes, vitamin B 12 deficiency, thyroid dysfunction, or history of alcoholism eliminating other causes of neuropathy. Patient does require assistance for ambulation using a cane and walker at home and wheelchair for long distances. She does not have painful paresthesias, and therefore there is no role for neuralgesic medication.  She is high risk for falls, and fortunately has not suffered any. Being that she is on anticoagulation, extra precautions for fall prevention was discussed.  I stressed the importance of using her walker at all times, and wheelchair as needed. With her neuropathy and normal liver function studies, she would be a candidate for therapy with patisiran.  We discussed home PT for gait training, which she would like to think about.  2.  C5 radiculopathy affecting the left upper extremity, patient is asymptomatic. Will follow clinically and if she develops any new neck pain and proximal arm weakness, further imaging and/or PT can be decided.  3.   Chronic heart failure due to hereditary ATTR amyloidosis with pulmonary hypertension followed by Dr. Aundra Dubin  Return to clinic in 3 months.   Thank you for allowing me to participate in patient's care.  If I can answer any additional questions, I would be pleased to do so.    Sincerely,    Broderic Bara K. Posey Pronto, DO

## 2017-09-21 ENCOUNTER — Other Ambulatory Visit (HOSPITAL_COMMUNITY): Payer: Self-pay

## 2017-09-21 DIAGNOSIS — I130 Hypertensive heart and chronic kidney disease with heart failure and stage 1 through stage 4 chronic kidney disease, or unspecified chronic kidney disease: Secondary | ICD-10-CM | POA: Diagnosis not present

## 2017-09-21 DIAGNOSIS — I5032 Chronic diastolic (congestive) heart failure: Secondary | ICD-10-CM | POA: Diagnosis not present

## 2017-09-21 DIAGNOSIS — E1122 Type 2 diabetes mellitus with diabetic chronic kidney disease: Secondary | ICD-10-CM | POA: Diagnosis not present

## 2017-09-21 DIAGNOSIS — N183 Chronic kidney disease, stage 3 (moderate): Secondary | ICD-10-CM | POA: Diagnosis not present

## 2017-09-21 DIAGNOSIS — R69 Illness, unspecified: Secondary | ICD-10-CM | POA: Diagnosis not present

## 2017-09-21 DIAGNOSIS — I480 Paroxysmal atrial fibrillation: Secondary | ICD-10-CM | POA: Diagnosis not present

## 2017-09-21 DIAGNOSIS — I272 Pulmonary hypertension, unspecified: Secondary | ICD-10-CM | POA: Diagnosis not present

## 2017-09-21 DIAGNOSIS — Z9981 Dependence on supplemental oxygen: Secondary | ICD-10-CM | POA: Diagnosis not present

## 2017-09-21 NOTE — Progress Notes (Signed)
Paramedicine Encounter    Patient ID: Natasha Fletcher, female    DOB: 12-Jul-1925, 84 y.o.   MRN: [id number]   Patient Care Team: Lorene Dy, MD as PCP - General (Internal Medicine) Troy Sine, MD as PCP - Cardiology (Cardiology) Constance Haw, MD as Consulting Physician (Cardiology)  Patient Active Problem List   Diagnosis Date Noted  . Cognitive decline   . Palliative care by specialist   . DNR (do not resuscitate) discussion   . Acute on chronic congestive heart failure ([place])   . Acute on chronic diastolic CHF (congestive heart failure) ([place])   . Sick sinus syndrome due to SA node dysfunction (HCC)   . Shortness of breath 06/13/2017  . Atrial fibrillation ([place]) 03/22/2016  . Bradycardia 08/22/2015  . Junctional (nodal) bradycardia 08/22/2015  . Malnutrition of moderate degree 03/19/2015  . Benign essential HTN   . Bradycardia, drug induced 03/18/2015  . Hypokalemia 02/16/2015  . Thrombocytopenia ([place]) 02/16/2015  . Sepsis due to urinary tract infection ([place])   . Sepsis ([place]) 02/12/2015  . Urinary tract infectious disease   . Long-term (current) use of anticoagulants 07/12/2014  . Chronic anticoagulation 02/11/2014  . Hypertensive cardiovascular disease 01/24/2014  . Chronic diastolic CHF (congestive heart failure) ([place]) 01/24/2014  . Anemia 01/24/2014  . Breast cancer of upper-inner quadrant of right female breast ([place]) 01/24/2014  . Acute diastolic heart failure ([place]) 11/11/2013  . Atrial flutter ([place]) 11/11/2013  . PAF (paroxysmal atrial fibrillation) ([place]) 11/10/2013  . Slurred speech 11/10/2013  . Protein-calorie malnutrition, severe ([employer]) 11/06/2013  . Coffee ground emesis 11/05/2013  . Dehydration 10/19/2013  . Urinary retention 10/18/2013  . GERD (gastroesophageal reflux disease) 03/20/2013  . S/P total knee arthroplasty   . UTI (urinary tract infection) 02/21/2013  . OA (osteoarthritis) of knee 02/19/2013  . Hypertension   . Thyroid disease    . Cancer of central portion of female breast ([place]) 04/14/2011    Current Outpatient Medications:  .  albuterol (PROVENTIL HFA;VENTOLIN HFA) 108 (90 Base) MCG/ACT inhaler, Inhale 2 puffs into the lungs every 6 (six) hours as needed for wheezing or shortness of breath., Disp: 1 Inhaler, Rfl: 2 .  amiodarone (PACERONE) 200 MG tablet, Take 0.5 tablets (100 mg total) by mouth daily., Disp: 45 tablet, Rfl: 2 .  carvedilol (COREG) 3.125 MG tablet, Take 1 tablet (3.125 mg total) by mouth 2 (two) times daily with a meal., Disp: 30 tablet, Rfl: 3 .  donepezil (ARICEPT) 5 MG tablet, Take 1 tablet (5 mg total) by mouth at bedtime., Disp: 30 tablet, Rfl: 0 .  ferrous sulfate 325 (65 FE) MG tablet, Take 1 tablet (325 mg total) by mouth every other day., Disp: 30 tablet, Rfl: 3 .  furosemide (LASIX) 80 MG tablet, Take 80mg  in the AM and 40mg  in the PM, Disp: 30 tablet, Rfl:  .  HYDROcodone-acetaminophen (NORCO) 7.5-325 MG tablet, Take 1 tablet by mouth every 6 (six) hours as needed for moderate pain., Disp: 30 tablet, Rfl: 0 .  latanoprost (XALATAN) 0.005 % ophthalmic solution, Place 1 drop into both eyes at bedtime., Disp: , Rfl:  .  LORazepam (ATIVAN) 1 MG tablet, Take 1 mg by mouth at bedtime as needed., Disp: , Rfl: 2 .  macitentan (OPSUMIT) 10 MG tablet, Take 1 tablet (10 mg total) by mouth daily., Disp: 30 tablet, Rfl: 11 .  oxybutynin (DITROPAN) 5 MG tablet, Take 5 mg by mouth 3 (three) times daily., Disp: , Rfl:  .  polyethylene glycol (MIRALAX / GLYCOLAX) packet, Take 17 g by mouth daily., Disp: 14 each, Rfl: 0 .  potassium chloride SA (K-DUR,KLOR-CON) 20 MEQ tablet, Take 1 tablet (20 mEq total) by mouth daily., Disp: 90 tablet, Rfl: 3 .  sertraline (ZOLOFT) 25 MG tablet, Take 1 tablet (25 mg total) by mouth daily., Disp: 30 tablet, Rfl: 0 .  tadalafil, PAH, (ADCIRCA) 20 MG tablet, Take 2 tablets (40 mg total) by mouth daily., Disp: 60 tablet, Rfl: 11 .  traZODone (DESYREL) 50 MG tablet, Take 50 mg by  mouth at bedtime., Disp: , Rfl: 2 .  warfarin (COUMADIN) 4 MG tablet, TAKE 1/2 TO 1 TABLET BY MOUTH DAILY AS DIRECTED BY COUMADIN CLINIC, Disp: 30 tablet, Rfl: 2 Allergies  Allergen Reactions  . Lactose Intolerance (Gi) Other (See Comments)    Stomach pain  . Milk-Related Compounds Other (See Comments)    Upset stomach   . Sulfa Antibiotics Itching      Social History   Socioeconomic History  . Marital status: Single    Spouse name: Not on file  . Number of children: 0  . Years of education: 85  . Highest education level: Some college, no degree  Occupational History  . Occupation: retired from [employer]  Social Needs  . Financial resource strain: Not on file  . Food insecurity:    Worry: Not on file    Inability: Not on file  . Transportation needs:    Medical: Not on file    Non-medical: Not on file  Tobacco Use  . Smoking status: Former Smoker    Packs/day: 0.00    Years: 30.00    Pack years: 0.00    Types: Cigarettes    Last attempt to quit: 04/21/1975    Years since quitting: 42.4  . Smokeless tobacco: Never Used  Substance and Sexual Activity  . Alcohol use: Not Currently    Comment: occasionally  . Drug use: No  . Sexual activity: Not Currently  Lifestyle  . Physical activity:    Days per week: Not on file    Minutes per session: Not on file  . Stress: Not on file  Relationships  . Social connections:    Talks on phone: Not on file    Gets together: Not on file    Attends religious service: Not on file    Active member of club or organization: Not on file    Attends meetings of clubs or organizations: Not on file    Relationship status: Not on file  . Intimate partner violence:    Fear of current or ex partner: Not on file    Emotionally abused: Not on file    Physically abused: Not on file    Forced sexual activity: Not on file  Other Topics Concern  . Not on file  Social History Narrative   Lives alone.  Has a dog.  No  children.  Retired from Equities trader.  Education: some college.     Physical Exam  Constitutional: She is oriented to person, place, and time.  Cardiovascular: Normal rate and regular rhythm.  Pulmonary/Chest: Effort normal and breath sounds normal.  Abdominal: Soft.  Musculoskeletal: Normal range of motion. She exhibits no edema.  Neurological: She is alert and oriented to person, place, and time.  Skin: Skin is warm and dry.  Psychiatric: She has a normal mood and affect.        Future Appointments  Date  Time Provider [place]  10/04/2017  8:30 AM [facility]  10/20/2017 10:00 AM Larey Dresser, MD [facility] None  11/10/2017  8:30 AM [facility] ECHO 3 [id number]  12/21/2017 10:40 AM Troy Sine, MD [facility]  02/01/2018  9:30 AM Posey Pronto, Arvin Collard K, DO [facility] None    BP 122/60 (BP Location: Right Arm, Patient Position: Sitting, Cuff Size: Large)   Pulse 66   Resp 18   Wt 138 lb (62.6 kg)   SpO2 96%   BMI 23.69 kg/m   Weight yesterday- 138 lb Last visit weight-   Ms Dirk was seen at home today and reported feeling well. She denied SOB, headaches, dizziness or orthopnea. She was nearly out of Alyq so I called the [facility] pharmacy who said they had not filled the medication for some reason but would be able to have it delivered by Friday. She had one pill left so I placed it in her morning bin tomorrow despite it being only half her prescribed dose. He pillbox had already been filled by a home health nurse "Sharyn Lull" so I verified her medications and double checked that the box had been filled properly. Upon inspection I found that the nurse had placed 140 mg in am and 120 mg in evening of lasix. I made the correction and placed the Aricept in her "Bed" bin. I will speak to [facility] when she gets back about the issue with her medications and have Dee check in with her Friday to ensure the Alyq is  delivered and see if she can put it in her pillbox.   Jacquiline Doe, EMT 09/21/17  ACTION: Home visit completed Next visit planned for Friday

## 2017-09-22 DIAGNOSIS — E1122 Type 2 diabetes mellitus with diabetic chronic kidney disease: Secondary | ICD-10-CM | POA: Diagnosis not present

## 2017-09-22 DIAGNOSIS — I272 Pulmonary hypertension, unspecified: Secondary | ICD-10-CM | POA: Diagnosis not present

## 2017-09-22 DIAGNOSIS — Z9981 Dependence on supplemental oxygen: Secondary | ICD-10-CM | POA: Diagnosis not present

## 2017-09-22 DIAGNOSIS — I5032 Chronic diastolic (congestive) heart failure: Secondary | ICD-10-CM | POA: Diagnosis not present

## 2017-09-22 DIAGNOSIS — N183 Chronic kidney disease, stage 3 (moderate): Secondary | ICD-10-CM | POA: Diagnosis not present

## 2017-09-22 DIAGNOSIS — R69 Illness, unspecified: Secondary | ICD-10-CM | POA: Diagnosis not present

## 2017-09-22 DIAGNOSIS — I480 Paroxysmal atrial fibrillation: Secondary | ICD-10-CM | POA: Diagnosis not present

## 2017-09-22 DIAGNOSIS — I130 Hypertensive heart and chronic kidney disease with heart failure and stage 1 through stage 4 chronic kidney disease, or unspecified chronic kidney disease: Secondary | ICD-10-CM | POA: Diagnosis not present

## 2017-09-23 ENCOUNTER — Encounter: Payer: Self-pay | Admitting: Cardiology

## 2017-09-23 DIAGNOSIS — I480 Paroxysmal atrial fibrillation: Secondary | ICD-10-CM | POA: Diagnosis not present

## 2017-09-23 DIAGNOSIS — Z9981 Dependence on supplemental oxygen: Secondary | ICD-10-CM | POA: Diagnosis not present

## 2017-09-23 DIAGNOSIS — R69 Illness, unspecified: Secondary | ICD-10-CM | POA: Diagnosis not present

## 2017-09-23 DIAGNOSIS — I272 Pulmonary hypertension, unspecified: Secondary | ICD-10-CM | POA: Diagnosis not present

## 2017-09-23 DIAGNOSIS — I5032 Chronic diastolic (congestive) heart failure: Secondary | ICD-10-CM | POA: Diagnosis not present

## 2017-09-23 DIAGNOSIS — N183 Chronic kidney disease, stage 3 (moderate): Secondary | ICD-10-CM | POA: Diagnosis not present

## 2017-09-23 DIAGNOSIS — E1122 Type 2 diabetes mellitus with diabetic chronic kidney disease: Secondary | ICD-10-CM | POA: Diagnosis not present

## 2017-09-23 DIAGNOSIS — I130 Hypertensive heart and chronic kidney disease with heart failure and stage 1 through stage 4 chronic kidney disease, or unspecified chronic kidney disease: Secondary | ICD-10-CM | POA: Diagnosis not present

## 2017-09-25 ENCOUNTER — Other Ambulatory Visit (HOSPITAL_COMMUNITY): Payer: Self-pay | Admitting: Cardiology

## 2017-09-26 ENCOUNTER — Other Ambulatory Visit (HOSPITAL_COMMUNITY): Payer: Self-pay

## 2017-09-26 DIAGNOSIS — N183 Chronic kidney disease, stage 3 (moderate): Secondary | ICD-10-CM | POA: Diagnosis not present

## 2017-09-26 DIAGNOSIS — E1122 Type 2 diabetes mellitus with diabetic chronic kidney disease: Secondary | ICD-10-CM | POA: Diagnosis not present

## 2017-09-26 DIAGNOSIS — Z9981 Dependence on supplemental oxygen: Secondary | ICD-10-CM | POA: Diagnosis not present

## 2017-09-26 DIAGNOSIS — R69 Illness, unspecified: Secondary | ICD-10-CM | POA: Diagnosis not present

## 2017-09-26 DIAGNOSIS — I480 Paroxysmal atrial fibrillation: Secondary | ICD-10-CM | POA: Diagnosis not present

## 2017-09-26 DIAGNOSIS — I130 Hypertensive heart and chronic kidney disease with heart failure and stage 1 through stage 4 chronic kidney disease, or unspecified chronic kidney disease: Secondary | ICD-10-CM | POA: Diagnosis not present

## 2017-09-26 DIAGNOSIS — I5032 Chronic diastolic (congestive) heart failure: Secondary | ICD-10-CM | POA: Diagnosis not present

## 2017-09-26 DIAGNOSIS — I272 Pulmonary hypertension, unspecified: Secondary | ICD-10-CM | POA: Diagnosis not present

## 2017-09-26 NOTE — Progress Notes (Signed)
Paramedicine Encounter    Patient ID: Natasha Fletcher, female    DOB: [birth year], 84 y.o.   MRN: [id number]   Patient Care Team: Lorene Dy, MD as PCP - General (Internal Medicine) Troy Sine, MD as PCP - Cardiology (Cardiology) Constance Haw, MD as Consulting Physician (Cardiology)  Patient Active Problem List   Diagnosis Date Noted  . Cognitive decline   . Palliative care by specialist   . DNR (do not resuscitate) discussion   . Acute on chronic congestive heart failure ([place])   . Acute on chronic diastolic CHF (congestive heart failure) ([place])   . Sick sinus syndrome due to SA node dysfunction (HCC)   . Shortness of breath 06/13/2017  . Atrial fibrillation ([place]) 03/22/2016  . Bradycardia 08/22/2015  . Junctional (nodal) bradycardia 08/22/2015  . Malnutrition of moderate degree 03/19/2015  . Benign essential HTN   . Bradycardia, drug induced 03/18/2015  . Hypokalemia 02/16/2015  . Thrombocytopenia ([place]) 02/16/2015  . Sepsis due to urinary tract infection ([place])   . Sepsis ([place]) 02/12/2015  . Urinary tract infectious disease   . Long-term (current) use of anticoagulants 07/12/2014  . Chronic anticoagulation 02/11/2014  . Hypertensive cardiovascular disease 01/24/2014  . Chronic diastolic CHF (congestive heart failure) ([place]) 01/24/2014  . Anemia 01/24/2014  . Breast cancer of upper-inner quadrant of right female breast ([place]) 01/24/2014  . Acute diastolic heart failure ([place]) 11/11/2013  . Atrial flutter ([place]) 11/11/2013  . PAF (paroxysmal atrial fibrillation) ([place]) 11/10/2013  . Slurred speech 11/10/2013  . Protein-calorie malnutrition, severe ([place]) 11/06/2013  . Coffee ground emesis 11/05/2013  . Dehydration 10/19/2013  . Urinary retention 10/18/2013  . GERD (gastroesophageal reflux disease) 03/20/2013  . S/P total knee arthroplasty   . UTI (urinary tract infection) 02/21/2013  . OA (osteoarthritis) of knee 02/19/2013  . Hypertension   . Thyroid disease    . Cancer of central portion of female breast ([place]) 04/14/2011    Current Outpatient Medications:  .  amiodarone (PACERONE) 200 MG tablet, Take 0.5 tablets (100 mg total) by mouth daily., Disp: 45 tablet, Rfl: 2 .  carvedilol (COREG) 3.125 MG tablet, TAKE 1 TABLET (3.125 MG TOTAL) BY MOUTH 2 (TWO) TIMES DAILY WITH A MEAL., Disp: 60 tablet, Rfl: 1 .  donepezil (ARICEPT) 5 MG tablet, Take 1 tablet (5 mg total) by mouth at bedtime., Disp: 30 tablet, Rfl: 0 .  ferrous sulfate 325 (65 FE) MG tablet, Take 1 tablet (325 mg total) by mouth every other day., Disp: 30 tablet, Rfl: 3 .  furosemide (LASIX) 80 MG tablet, Take 80mg  in the AM and 40mg  in the PM, Disp: 30 tablet, Rfl:  .  HYDROcodone-acetaminophen (NORCO) 7.5-325 MG tablet, Take 1 tablet by mouth every 6 (six) hours as needed for moderate pain., Disp: 30 tablet, Rfl: 0 .  latanoprost (XALATAN) 0.005 % ophthalmic solution, Place 1 drop into both eyes at bedtime., Disp: , Rfl:  .  LORazepam (ATIVAN) 1 MG tablet, Take 1 mg by mouth at bedtime as needed., Disp: , Rfl: 2 .  macitentan (OPSUMIT) 10 MG tablet, Take 1 tablet (10 mg total) by mouth daily., Disp: 30 tablet, Rfl: 11 .  Melatonin 3 MG TABS, Take 9 mg by mouth., Disp: , Rfl:  .  oxybutynin (DITROPAN) 5 MG tablet, Take 5 mg by mouth 3 (three) times daily., Disp: , Rfl:  .  polyethylene glycol (MIRALAX / GLYCOLAX) packet, Take 17 g by mouth daily., Disp: 14 each, Rfl: 0 .  potassium chloride SA (K-DUR,KLOR-CON) 20 MEQ tablet, Take 1 tablet (20 mEq total) by mouth daily., Disp: 90 tablet, Rfl: 3 .  sertraline (ZOLOFT) 25 MG tablet, Take 1 tablet (25 mg total) by mouth daily., Disp: 30 tablet, Rfl: 0 .  tadalafil, PAH, (ADCIRCA) 20 MG tablet, Take 2 tablets (40 mg total) by mouth daily., Disp: 60 tablet, Rfl: 11 .  traZODone (DESYREL) 50 MG tablet, Take 50 mg by mouth at bedtime., Disp: , Rfl: 2 .  warfarin (COUMADIN) 4 MG tablet, TAKE 1/2 TO 1 TABLET BY MOUTH DAILY AS DIRECTED BY COUMADIN  CLINIC, Disp: 30 tablet, Rfl: 2 .  albuterol (PROVENTIL HFA;VENTOLIN HFA) 108 (90 Base) MCG/ACT inhaler, Inhale 2 puffs into the lungs every 6 (six) hours as needed for wheezing or shortness of breath. (Patient not taking: Reported on 09/26/2017), Disp: 1 Inhaler, Rfl: 2 Allergies  Allergen Reactions  . Lactose Intolerance (Gi) Other (See Comments)    Stomach pain  . Milk-Related Compounds Other (See Comments)    Upset stomach   . Sulfa Antibiotics Itching      Social History   Socioeconomic History  . Marital status: Single    Spouse name: Not on file  . Number of children: 0  . Years of education: 71  . Highest education level: Some college, no degree  Occupational History  . Occupation: retired from [employer]  Social Needs  . Financial resource strain: Not on file  . Food insecurity:    Worry: Not on file    Inability: Not on file  . Transportation needs:    Medical: Not on file    Non-medical: Not on file  Tobacco Use  . Smoking status: Former Smoker    Packs/day: 0.00    Years: 30.00    Pack years: 0.00    Types: Cigarettes    Last attempt to quit: 04/21/1975    Years since quitting: 42.4  . Smokeless tobacco: Never Used  Substance and Sexual Activity  . Alcohol use: Not Currently    Comment: occasionally  . Drug use: No  . Sexual activity: Not Currently  Lifestyle  . Physical activity:    Days per week: Not on file    Minutes per session: Not on file  . Stress: Not on file  Relationships  . Social connections:    Talks on phone: Not on file    Gets together: Not on file    Attends religious service: Not on file    Active member of club or organization: Not on file    Attends meetings of clubs or organizations: Not on file    Relationship status: Not on file  . Intimate partner violence:    Fear of current or ex partner: Not on file    Emotionally abused: Not on file    Physically abused: Not on file    Forced sexual activity: Not on  file  Other Topics Concern  . Not on file  Social History Narrative   Lives alone.  Has a dog.  No children.  Retired from [employer].  Education: some college.     Physical Exam      Future Appointments  Date Time Provider [place]  10/04/2017  8:30 AM [facility]IN CL[facility]IC [facility]  10/20/2017 10:00 AM Larey Dresser, MD MC-[facility] None  11/10/2017  8:30 AM MC-CV [facility] ECHO 3 [facility]  12/21/2017 10:40 AM Troy Sine, MD CVD-NORTHLIN [facility]  02/01/2018  9:30 AM Posey Pronto, Donika K, DO LBN-LBNG None    BP (!) 112/56   Pulse 64   Resp 16   Wt 138 lb (62.6 kg)   SpO2 99%   BMI 23.69 kg/m   Weight yesterday-139 Last visit weight-138  Pt reports that she didn't feel well yesterday, she isnt feeling well this morning either. She is having pain to her leg and is going to rub some pain gel to it today. She is getting advanced to come do another demonstration on her 60 today. She is wearing it continuously. Feels like it is helping some while moving around the apt. Her weight is hanging around 138-139. She has poor appetite and doesn't eat many meals.  meds verified and pill box refilled.  Ordered opsumit, carvedilol, and ferrous sulfate.  Once pt gets the rx that was called in I will come back and place the ferrous sulfate in for rest of week. She will call once she gets back with med this week.    Marylouise Stacks, [facility] Paramedic  09/26/17

## 2017-09-27 ENCOUNTER — Encounter: Payer: Medicare HMO | Admitting: Neurology

## 2017-09-27 ENCOUNTER — Ambulatory Visit: Payer: Medicare HMO | Admitting: Neurology

## 2017-09-27 DIAGNOSIS — N312 Flaccid neuropathic bladder, not elsewhere classified: Secondary | ICD-10-CM | POA: Diagnosis not present

## 2017-09-28 DIAGNOSIS — I130 Hypertensive heart and chronic kidney disease with heart failure and stage 1 through stage 4 chronic kidney disease, or unspecified chronic kidney disease: Secondary | ICD-10-CM | POA: Diagnosis not present

## 2017-09-28 DIAGNOSIS — Z9981 Dependence on supplemental oxygen: Secondary | ICD-10-CM | POA: Diagnosis not present

## 2017-09-28 DIAGNOSIS — I5032 Chronic diastolic (congestive) heart failure: Secondary | ICD-10-CM | POA: Diagnosis not present

## 2017-09-28 DIAGNOSIS — I480 Paroxysmal atrial fibrillation: Secondary | ICD-10-CM | POA: Diagnosis not present

## 2017-09-28 DIAGNOSIS — N183 Chronic kidney disease, stage 3 (moderate): Secondary | ICD-10-CM | POA: Diagnosis not present

## 2017-09-28 DIAGNOSIS — E1122 Type 2 diabetes mellitus with diabetic chronic kidney disease: Secondary | ICD-10-CM | POA: Diagnosis not present

## 2017-09-28 DIAGNOSIS — I272 Pulmonary hypertension, unspecified: Secondary | ICD-10-CM | POA: Diagnosis not present

## 2017-09-28 DIAGNOSIS — R69 Illness, unspecified: Secondary | ICD-10-CM | POA: Diagnosis not present

## 2017-09-29 DIAGNOSIS — I5032 Chronic diastolic (congestive) heart failure: Secondary | ICD-10-CM | POA: Diagnosis not present

## 2017-09-29 DIAGNOSIS — Z9981 Dependence on supplemental oxygen: Secondary | ICD-10-CM | POA: Diagnosis not present

## 2017-09-29 DIAGNOSIS — R69 Illness, unspecified: Secondary | ICD-10-CM | POA: Diagnosis not present

## 2017-09-29 DIAGNOSIS — E1122 Type 2 diabetes mellitus with diabetic chronic kidney disease: Secondary | ICD-10-CM | POA: Diagnosis not present

## 2017-09-29 DIAGNOSIS — I272 Pulmonary hypertension, unspecified: Secondary | ICD-10-CM | POA: Diagnosis not present

## 2017-09-29 DIAGNOSIS — I130 Hypertensive heart and chronic kidney disease with heart failure and stage 1 through stage 4 chronic kidney disease, or unspecified chronic kidney disease: Secondary | ICD-10-CM | POA: Diagnosis not present

## 2017-09-29 DIAGNOSIS — I480 Paroxysmal atrial fibrillation: Secondary | ICD-10-CM | POA: Diagnosis not present

## 2017-09-29 DIAGNOSIS — N183 Chronic kidney disease, stage 3 (moderate): Secondary | ICD-10-CM | POA: Diagnosis not present

## 2017-10-01 DIAGNOSIS — I5032 Chronic diastolic (congestive) heart failure: Secondary | ICD-10-CM | POA: Diagnosis not present

## 2017-10-01 DIAGNOSIS — Z5189 Encounter for other specified aftercare: Secondary | ICD-10-CM | POA: Diagnosis not present

## 2017-10-01 DIAGNOSIS — R5381 Other malaise: Secondary | ICD-10-CM | POA: Diagnosis not present

## 2017-10-01 DIAGNOSIS — M2550 Pain in unspecified joint: Secondary | ICD-10-CM | POA: Diagnosis not present

## 2017-10-01 DIAGNOSIS — S83509A Sprain of unspecified cruciate ligament of unspecified knee, initial encounter: Secondary | ICD-10-CM | POA: Diagnosis not present

## 2017-10-01 DIAGNOSIS — Z9181 History of falling: Secondary | ICD-10-CM | POA: Diagnosis not present

## 2017-10-01 DIAGNOSIS — R0602 Shortness of breath: Secondary | ICD-10-CM | POA: Diagnosis not present

## 2017-10-01 DIAGNOSIS — I5022 Chronic systolic (congestive) heart failure: Secondary | ICD-10-CM | POA: Diagnosis not present

## 2017-10-01 DIAGNOSIS — R269 Unspecified abnormalities of gait and mobility: Secondary | ICD-10-CM | POA: Diagnosis not present

## 2017-10-01 DIAGNOSIS — A419 Sepsis, unspecified organism: Secondary | ICD-10-CM | POA: Diagnosis not present

## 2017-10-03 ENCOUNTER — Other Ambulatory Visit (HOSPITAL_COMMUNITY): Payer: Self-pay

## 2017-10-03 NOTE — Progress Notes (Signed)
Paramedicine Encounter    Patient ID: Natasha Fletcher, female    DOB: [birth year], 84 y.o.   MRN: [id number]   Patient Care Team: Lorene Dy, MD as PCP - General (Internal Medicine) Troy Sine, MD as PCP - Cardiology (Cardiology) Constance Haw, MD as Consulting Physician (Cardiology)  Patient Active Problem List   Diagnosis Date Noted  . Cognitive decline   . Palliative care by specialist   . DNR (do not resuscitate) discussion   . Acute on chronic congestive heart failure ([place])   . Acute on chronic diastolic CHF (congestive heart failure) ([place])   . Sick sinus syndrome due to SA node dysfunction (HCC)   . Shortness of breath 06/13/2017  . Atrial fibrillation ([place]) 03/22/2016  . Bradycardia 08/22/2015  . Junctional (nodal) bradycardia 08/22/2015  . Malnutrition of moderate degree 03/19/2015  . Benign essential HTN   . Bradycardia, drug induced 03/18/2015  . Hypokalemia 02/16/2015  . Thrombocytopenia ([place]) 02/16/2015  . Sepsis due to urinary tract infection ([place])   . Sepsis ([place]) 02/12/2015  . Urinary tract infectious disease   . Long-term (current) use of anticoagulants 07/12/2014  . Chronic anticoagulation 02/11/2014  . Hypertensive cardiovascular disease 01/24/2014  . Chronic diastolic CHF (congestive heart failure) ([place]) 01/24/2014  . Anemia 01/24/2014  . Breast cancer of upper-inner quadrant of right female breast ([place]) 01/24/2014  . Acute diastolic heart failure ([place]) 11/11/2013  . Atrial flutter ([place]) 11/11/2013  . PAF (paroxysmal atrial fibrillation) ([place]) 11/10/2013  . Slurred speech 11/10/2013  . Protein-calorie malnutrition, severe ([place]) 11/06/2013  . Coffee ground emesis 11/05/2013  . Dehydration 10/19/2013  . Urinary retention 10/18/2013  . GERD (gastroesophageal reflux disease) 03/20/2013  . S/P total knee arthroplasty   . UTI (urinary tract infection) 02/21/2013  . OA (osteoarthritis) of knee 02/19/2013  . Hypertension   . Thyroid disease    . Cancer of central portion of female breast ([place]) 04/14/2011    Current Outpatient Medications:  .  amiodarone (PACERONE) 200 MG tablet, Take 0.5 tablets (100 mg total) by mouth daily., Disp: 45 tablet, Rfl: 2 .  carvedilol (COREG) 3.125 MG tablet, TAKE 1 TABLET (3.125 MG TOTAL) BY MOUTH 2 (TWO) TIMES DAILY WITH A MEAL., Disp: 60 tablet, Rfl: 1 .  donepezil (ARICEPT) 5 MG tablet, Take 1 tablet (5 mg total) by mouth at bedtime., Disp: 30 tablet, Rfl: 0 .  ferrous sulfate 325 (65 FE) MG tablet, Take 1 tablet (325 mg total) by mouth every other day., Disp: 30 tablet, Rfl: 3 .  furosemide (LASIX) 80 MG tablet, Take 80mg  in the AM and 40mg  in the PM, Disp: 30 tablet, Rfl:  .  HYDROcodone-acetaminophen (NORCO) 7.5-325 MG tablet, Take 1 tablet by mouth every 6 (six) hours as needed for moderate pain., Disp: 30 tablet, Rfl: 0 .  latanoprost (XALATAN) 0.005 % ophthalmic solution, Place 1 drop into both eyes at bedtime., Disp: , Rfl:  .  macitentan (OPSUMIT) 10 MG tablet, Take 1 tablet (10 mg total) by mouth daily., Disp: 30 tablet, Rfl: 11 .  Melatonin 3 MG TABS, Take 9 mg by mouth., Disp: , Rfl:  .  oxybutynin (DITROPAN) 5 MG tablet, Take 5 mg by mouth 3 (three) times daily., Disp: , Rfl:  .  polyethylene glycol (MIRALAX / GLYCOLAX) packet, Take 17 g by mouth daily., Disp: 14 each, Rfl: 0 .  potassium chloride SA (K-DUR,KLOR-CON) 20 MEQ tablet, Take 1 tablet (20 mEq total) by mouth daily., Disp: 90 tablet, Rfl:  3 .  sertraline (ZOLOFT) 25 MG tablet, Take 1 tablet (25 mg total) by mouth daily., Disp: 30 tablet, Rfl: 0 .  tadalafil, PAH, (ADCIRCA) 20 MG tablet, Take 2 tablets (40 mg total) by mouth daily., Disp: 60 tablet, Rfl: 11 .  traZODone (DESYREL) 50 MG tablet, Take 50 mg by mouth at bedtime., Disp: , Rfl: 2 .  warfarin (COUMADIN) 4 MG tablet, TAKE 1/2 TO 1 TABLET BY MOUTH DAILY AS DIRECTED BY COUMADIN CLINIC, Disp: 30 tablet, Rfl: 2 .  albuterol (PROVENTIL HFA;VENTOLIN HFA) 108 (90 Base) MCG/ACT  inhaler, Inhale 2 puffs into the lungs every 6 (six) hours as needed for wheezing or shortness of breath. (Patient not taking: Reported on 09/26/2017), Disp: 1 Inhaler, Rfl: 2 .  LORazepam (ATIVAN) 1 MG tablet, Take 1 mg by mouth at bedtime as needed., Disp: , Rfl: 2 Allergies  Allergen Reactions  . Lactose Intolerance (Gi) Other (See Comments)    Stomach pain  . Milk-Related Compounds Other (See Comments)    Upset stomach   . Sulfa Antibiotics Itching      Social History   Socioeconomic History  . Marital status: Single    Spouse name: Not on file  . Number of children: 0  . Years of education: 74  . Highest education level: Some college, no degree  Occupational History  . Occupation: retired from [employer]  Social Needs  . Financial resource strain: Not on file  . Food insecurity:    Worry: Not on file    Inability: Not on file  . Transportation needs:    Medical: Not on file    Non-medical: Not on file  Tobacco Use  . Smoking status: Former Smoker    Packs/day: 0.00    Years: 30.00    Pack years: 0.00    Types: Cigarettes    Last attempt to quit: 04/21/1975    Years since quitting: 42.4  . Smokeless tobacco: Never Used  Substance and Sexual Activity  . Alcohol use: Not Currently    Comment: occasionally  . Drug use: No  . Sexual activity: Not Currently  Lifestyle  . Physical activity:    Days per week: Not on file    Minutes per session: Not on file  . Stress: Not on file  Relationships  . Social connections:    Talks on phone: Not on file    Gets together: Not on file    Attends religious service: Not on file    Active member of club or organization: Not on file    Attends meetings of clubs or organizations: Not on file    Relationship status: Not on file  . Intimate partner violence:    Fear of current or ex partner: Not on file    Emotionally abused: Not on file    Physically abused: Not on file    Forced sexual activity: Not on file   Other Topics Concern  . Not on file  Social History Narrative   Lives alone.  Has a dog.  No children.  Retired from [employer].  Education: some college.     Physical Exam      Future Appointments  Date Time Provider [place]  10/04/2017  8:30 AM [facility] COUMADIN CLINIC CVD[facility]LIN [facility]  10/20/2017 10:00 AM Larey Dresser, MD MC-HVSC None  11/10/2017  8:30 AM MC-CV [facility] ECHO 3 [facility]CHO [facility]  12/21/2017 10:40 AM Troy Sine, MD CVD[facility]NORTHLIN [facility]  02/01/2018  9:30 AM Posey Pronto, Donika K, DO LBN-LBNG None    BP 112/60   Pulse 66   Resp 14   Wt 138 lb (62.6 kg)   SpO2 98%   BMI 23.69 kg/m   Weight yesterday-140  Last visit weight-138   Pt reports that the past few times she has eaten cereal and oatmeal for breakfast it has made her feel very nauseated. I advised her to try some toast and maybe a banana for breakfast to see how that settles with her.  Her apt is very warm inside--it was set on 86 degrees-she asked me to turn it down so I set it to 80--she felt very stuffy inside-I advised her to keep it cooler than 86 as it will help with her sob as well.  meds verified and pill box refilled.    She goes tomor to check coumadin-if there are any changes I will come back to fix in pill box.she will call me once she gets back home from that visit.   She is interested in senior resources activities. -I gave her the number and name on whom to reach and will f/u with her if she needs any further assistance with app or anything.   *placed iron in tues/thurs/sat/mon for this week.   Weights running between 138-140. She reports her breathing is doing ok, when she gets up and about it can feel more sob.    Marylouise Stacks, [place] [facility] Paramedic  10/03/17

## 2017-10-04 ENCOUNTER — Ambulatory Visit: Payer: Medicare HMO | Admitting: Pharmacist Clinician (PhC)/ Clinical Pharmacy Specialist

## 2017-10-04 ENCOUNTER — Other Ambulatory Visit (HOSPITAL_COMMUNITY): Payer: Self-pay

## 2017-10-04 DIAGNOSIS — Z7901 Long term (current) use of anticoagulants: Secondary | ICD-10-CM

## 2017-10-04 DIAGNOSIS — I480 Paroxysmal atrial fibrillation: Secondary | ICD-10-CM

## 2017-10-04 LAB — POCT INR: INR: 1.6 — AB (ref 2.0–3.0)

## 2017-10-04 NOTE — Patient Instructions (Signed)
Description   Take another 1/2 tablet today Tuesday July 2 (1 tablet total for today), then continue with 1/2 tablet daily except no warfarin on Wednesdays.  Repeat INR in 2 weeks.

## 2017-10-04 NOTE — Progress Notes (Signed)
Came by today for med rec post INR check today--pill box changed according to coumadin clinic directions for her to take 1 whole tab of 4mg  today, none on wed and rest days 2mg  (half tab).    Marylouise Stacks, EMT-Paramedic  10/04/17

## 2017-10-07 DIAGNOSIS — N183 Chronic kidney disease, stage 3 (moderate): Secondary | ICD-10-CM | POA: Diagnosis not present

## 2017-10-07 DIAGNOSIS — I130 Hypertensive heart and chronic kidney disease with heart failure and stage 1 through stage 4 chronic kidney disease, or unspecified chronic kidney disease: Secondary | ICD-10-CM | POA: Diagnosis not present

## 2017-10-07 DIAGNOSIS — E1122 Type 2 diabetes mellitus with diabetic chronic kidney disease: Secondary | ICD-10-CM | POA: Diagnosis not present

## 2017-10-07 DIAGNOSIS — I272 Pulmonary hypertension, unspecified: Secondary | ICD-10-CM | POA: Diagnosis not present

## 2017-10-07 DIAGNOSIS — I480 Paroxysmal atrial fibrillation: Secondary | ICD-10-CM | POA: Diagnosis not present

## 2017-10-07 DIAGNOSIS — R69 Illness, unspecified: Secondary | ICD-10-CM | POA: Diagnosis not present

## 2017-10-07 DIAGNOSIS — I5032 Chronic diastolic (congestive) heart failure: Secondary | ICD-10-CM | POA: Diagnosis not present

## 2017-10-07 DIAGNOSIS — Z9981 Dependence on supplemental oxygen: Secondary | ICD-10-CM | POA: Diagnosis not present

## 2017-10-10 ENCOUNTER — Other Ambulatory Visit (HOSPITAL_COMMUNITY): Payer: Self-pay

## 2017-10-10 NOTE — Progress Notes (Signed)
Paramedicine Encounter    Patient ID: Natasha Fletcher, female    DOB: September 04, 1925, 84 y.o.   MRN: [id number]   Patient Care Team: Lorene Dy, MD as PCP - General (Internal Medicine) Troy Sine, MD as PCP - Cardiology (Cardiology) Constance Haw, MD as Consulting Physician (Cardiology)  Patient Active Problem List   Diagnosis Date Noted  . Cognitive decline   . Palliative care by specialist   . DNR (do not resuscitate) discussion   . Acute on chronic congestive heart failure ([place])   . Acute on chronic diastolic CHF (congestive heart failure) ([place])   . Sick sinus syndrome due to SA node dysfunction (HCC)   . Shortness of breath 06/13/2017  . Atrial fibrillation ([place]) 03/22/2016  . Bradycardia 08/22/2015  . Junctional (nodal) bradycardia 08/22/2015  . Malnutrition of moderate degree 03/19/2015  . Benign essential HTN   . Bradycardia, drug induced 03/18/2015  . Hypokalemia 02/16/2015  . Thrombocytopenia ([place]) 02/16/2015  . Sepsis due to urinary tract infection ([place])   . Sepsis ([place]) 02/12/2015  . Urinary tract infectious disease   . Long-term (current) use of anticoagulants 07/12/2014  . Chronic anticoagulation 02/11/2014  . Hypertensive cardiovascular disease 01/24/2014  . Chronic diastolic CHF (congestive heart failure) ([place]) 01/24/2014  . Anemia 01/24/2014  . Breast cancer of upper-inner quadrant of right female breast ([place]) 01/24/2014  . Acute diastolic heart failure ([place]) 11/11/2013  . Atrial flutter ([place]) 11/11/2013  . PAF (paroxysmal atrial fibrillation) ([place]) 11/10/2013  . Slurred speech 11/10/2013  . Protein-calorie malnutrition, severe ([place]) 11/06/2013  . Coffee ground emesis 11/05/2013  . Dehydration 10/19/2013  . Urinary retention 10/18/2013  . GERD (gastroesophageal reflux disease) 03/20/2013  . S/P total knee arthroplasty   . UTI (urinary tract infection) 02/21/2013  . OA (osteoarthritis) of knee 02/19/2013  . Hypertension   . Thyroid disease    . Cancer of central portion of female breast ([place]) 04/14/2011    Current Outpatient Medications:  .  amiodarone (PACERONE) 200 MG tablet, Take 0.5 tablets (100 mg total) by mouth daily., Disp: 45 tablet, Rfl: 2 .  carvedilol (COREG) 3.125 MG tablet, TAKE 1 TABLET (3.125 MG TOTAL) BY MOUTH 2 (TWO) TIMES DAILY WITH A MEAL., Disp: 60 tablet, Rfl: 1 .  donepezil (ARICEPT) 5 MG tablet, Take 1 tablet (5 mg total) by mouth at bedtime., Disp: 30 tablet, Rfl: 0 .  ferrous sulfate 325 (65 FE) MG tablet, Take 1 tablet (325 mg total) by mouth every other day., Disp: 30 tablet, Rfl: 3 .  furosemide (LASIX) 80 MG tablet, Take 80mg  in the AM and 40mg  in the PM, Disp: 30 tablet, Rfl:  .  HYDROcodone-acetaminophen (NORCO) 7.5-325 MG tablet, Take 1 tablet by mouth every 6 (six) hours as needed for moderate pain., Disp: 30 tablet, Rfl: 0 .  latanoprost (XALATAN) 0.005 % ophthalmic solution, Place 1 drop into both eyes at bedtime., Disp: , Rfl:  .  LORazepam (ATIVAN) 1 MG tablet, Take 1 mg by mouth at bedtime as needed., Disp: , Rfl: 2 .  macitentan (OPSUMIT) 10 MG tablet, Take 1 tablet (10 mg total) by mouth daily., Disp: 30 tablet, Rfl: 11 .  Melatonin 3 MG TABS, Take 9 mg by mouth., Disp: , Rfl:  .  oxybutynin (DITROPAN) 5 MG tablet, Take 5 mg by mouth 3 (three) times daily., Disp: , Rfl:  .  polyethylene glycol (MIRALAX / GLYCOLAX) packet, Take 17 g by mouth daily., Disp: 14 each, Rfl: 0 .  potassium chloride SA (K-DUR,KLOR-CON) 20 MEQ tablet, Take 1 tablet (20 mEq total) by mouth daily., Disp: 90 tablet, Rfl: 3 .  sertraline (ZOLOFT) 25 MG tablet, Take 1 tablet (25 mg total) by mouth daily., Disp: 30 tablet, Rfl: 0 .  tadalafil, PAH, (ADCIRCA) 20 MG tablet, Take 2 tablets (40 mg total) by mouth daily., Disp: 60 tablet, Rfl: 11 .  traZODone (DESYREL) 50 MG tablet, Take 50 mg by mouth at bedtime., Disp: , Rfl: 2 .  warfarin (COUMADIN) 4 MG tablet, TAKE 1/2 TO 1 TABLET BY MOUTH DAILY AS DIRECTED BY COUMADIN  CLINIC, Disp: 30 tablet, Rfl: 2 .  albuterol (PROVENTIL HFA;VENTOLIN HFA) 108 (90 Base) MCG/ACT inhaler, Inhale 2 puffs into the lungs every 6 (six) hours as needed for wheezing or shortness of breath. (Patient not taking: Reported on 09/26/2017), Disp: 1 Inhaler, Rfl: 2 Allergies  Allergen Reactions  . Lactose Intolerance (Gi) Other (See Comments)    Stomach pain  . Milk-Related Compounds Other (See Comments)    Upset stomach   . Sulfa Antibiotics Itching      Social History   Socioeconomic History  . Marital status: Single    Spouse name: Not on file  . Number of children: 0  . Years of education: 47  . Highest education level: Some college, no degree  Occupational History  . Occupation: retired from [employer]  Social Needs  . Financial resource strain: Not on file  . Food insecurity:    Worry: Not on file    Inability: Not on file  . Transportation needs:    Medical: Not on file    Non-medical: Not on file  Tobacco Use  . Smoking status: Former Smoker    Packs/day: 0.00    Years: 30.00    Pack years: 0.00    Types: Cigarettes    Last attempt to quit: 04/21/1975    Years since quitting: 42.5  . Smokeless tobacco: Never Used  Substance and Sexual Activity  . Alcohol use: Not Currently    Comment: occasionally  . Drug use: No  . Sexual activity: Not Currently  Lifestyle  . Physical activity:    Days per week: Not on file    Minutes per session: Not on file  . Stress: Not on file  Relationships  . Social connections:    Talks on phone: Not on file    Gets together: Not on file    Attends religious service: Not on file    Active member of club or organization: Not on file    Attends meetings of clubs or organizations: Not on file    Relationship status: Not on file  . Intimate partner violence:    Fear of current or ex partner: Not on file    Emotionally abused: Not on file    Physically abused: Not on file    Forced sexual activity: Not on  file  Other Topics Concern  . Not on file  Social History Narrative   Lives alone.  Has a dog.  No children.  Retired from [employer].  Education: some college.     Physical Exam      Future Appointments  Date Time Provider [place]  10/18/2017  8:45 AM CVD-NLINE COUMADIN CLINIC CVD-NORTHLIN [facility]  10/20/2017 10:00 AM Larey Dresser, MD MC-HV[facility] None  11/10/2017  8:30 AM MC-CV [facility] ECHO 3 MC[facility]SITE3ECHO [facility]  12/21/2017 10:40 AM Troy Sine, MD CVD-NORTHLIN [facility]  02/01/2018  9:30 AM Posey Pronto, Donika K, DO LBN-LBNG None    BP 124/70   Pulse 62   Resp 15   Wt 140 lb (63.5 kg)   SpO2 99%   BMI 24.03 kg/m   Weight yesterday-138  Last visit weight-138  Pt reports she is doing ok, she has her good days and bad days. Weight bouncing around 138-140. She has intermittent appetite.  She reports nurse is done with her visits now. She still has PT for now, not sure how much longer he will be coming.  No missed doses of her meds.  Iron placed in wed/fri/sun this week.  meds verified and pill box refilled.  Ordered furosemide-pharmacy said it would be ready by Wednesday.  Next visit will be next Monday unless she has issues before then.   Marylouise Stacks, [place] [facility] Paramedic  10/10/17

## 2017-10-14 ENCOUNTER — Encounter (HOSPITAL_COMMUNITY): Payer: Self-pay | Admitting: *Deleted

## 2017-10-17 ENCOUNTER — Other Ambulatory Visit (HOSPITAL_COMMUNITY): Payer: Self-pay | Admitting: Cardiology

## 2017-10-17 ENCOUNTER — Other Ambulatory Visit (HOSPITAL_COMMUNITY): Payer: Self-pay

## 2017-10-17 NOTE — Progress Notes (Signed)
Paramedicine Encounter    Patient ID: Natasha Fletcher, female    DOB: [birth year], 84 y.o.   MRN: [id number]   Patient Care Team: Lorene Dy, MD as PCP - General (Internal Medicine) Troy Sine, MD as PCP - Cardiology (Cardiology) Constance Haw, MD as Consulting Physician (Cardiology)  Patient Active Problem List   Diagnosis Date Noted  . Cognitive decline   . Palliative care by specialist   . DNR (do not resuscitate) discussion   . Acute on chronic congestive heart failure ([place])   . Acute on chronic diastolic CHF (congestive heart failure) ([place])   . Sick sinus syndrome due to SA node dysfunction (HCC)   . Shortness of breath 06/13/2017  . Atrial fibrillation ([place]) 03/22/2016  . Bradycardia 08/22/2015  . Junctional (nodal) bradycardia 08/22/2015  . Malnutrition of moderate degree 03/19/2015  . Benign essential HTN   . Bradycardia, drug induced 03/18/2015  . Hypokalemia 02/16/2015  . Thrombocytopenia ([place]) 02/16/2015  . Sepsis due to urinary tract infection ([place])   . Sepsis ([place]) 02/12/2015  . Urinary tract infectious disease   . Long-term (current) use of anticoagulants 07/12/2014  . Chronic anticoagulation 02/11/2014  . Hypertensive cardiovascular disease 01/24/2014  . Chronic diastolic CHF (congestive heart failure) ([place]) 01/24/2014  . Anemia 01/24/2014  . Breast cancer of upper-inner quadrant of right female breast ([place]) 01/24/2014  . Acute diastolic heart failure ([place]) 11/11/2013  . Atrial flutter ([place]) 11/11/2013  . PAF (paroxysmal atrial fibrillation) ([place]) 11/10/2013  . Slurred speech 11/10/2013  . Protein-calorie malnutrition, severe ([place]) 11/06/2013  . Coffee ground emesis 11/05/2013  . Dehydration 10/19/2013  . Urinary retention 10/18/2013  . GERD (gastroesophageal reflux disease) 03/20/2013  . S/P total knee arthroplasty   . UTI (urinary tract infection) 02/21/2013  . OA (osteoarthritis) of knee 02/19/2013  . Hypertension   . Thyroid disease    . Cancer of central portion of female breast ([place]) 04/14/2011    Current Outpatient Medications:  .  amiodarone (PACERONE) 200 MG tablet, Take 0.5 tablets (100 mg total) by mouth daily., Disp: 45 tablet, Rfl: 2 .  carvedilol (COREG) 3.125 MG tablet, TAKE 1 TABLET (3.125 MG TOTAL) BY MOUTH 2 (TWO) TIMES DAILY WITH A MEAL., Disp: 60 tablet, Rfl: 1 .  donepezil (ARICEPT) 5 MG tablet, Take 1 tablet (5 mg total) by mouth at bedtime., Disp: 30 tablet, Rfl: 0 .  ferrous sulfate 325 (65 FE) MG tablet, Take 1 tablet (325 mg total) by mouth every other day. (Patient taking differently: Take 325 mg by mouth every other day. ), Disp: 30 tablet, Rfl: 3 .  furosemide (LASIX) 80 MG tablet, Take 80mg  in the AM and 40mg  in the PM, Disp: 30 tablet, Rfl:  .  HYDROcodone-acetaminophen (NORCO) 7.5-325 MG tablet, Take 1 tablet by mouth every 6 (six) hours as needed for moderate pain., Disp: 30 tablet, Rfl: 0 .  latanoprost (XALATAN) 0.005 % ophthalmic solution, Place 1 drop into both eyes at bedtime., Disp: , Rfl:  .  LORazepam (ATIVAN) 1 MG tablet, Take 1 mg by mouth at bedtime as needed., Disp: , Rfl: 2 .  macitentan (OPSUMIT) 10 MG tablet, Take 1 tablet (10 mg total) by mouth daily., Disp: 30 tablet, Rfl: 11 .  Melatonin 3 MG TABS, Take 9 mg by mouth., Disp: , Rfl:  .  oxybutynin (DITROPAN) 5 MG tablet, Take 5 mg by mouth 3 (three) times daily., Disp: , Rfl:  .  polyethylene glycol (MIRALAX / GLYCOLAX) packet, Take  17 g by mouth daily., Disp: 14 each, Rfl: 0 .  potassium chloride SA (K-DUR,KLOR-CON) 20 MEQ tablet, Take 1 tablet (20 mEq total) by mouth daily., Disp: 90 tablet, Rfl: 3 .  sertraline (ZOLOFT) 25 MG tablet, Take 1 tablet (25 mg total) by mouth daily., Disp: 30 tablet, Rfl: 0 .  tadalafil, PAH, (ADCIRCA) 20 MG tablet, Take 2 tablets (40 mg total) by mouth daily., Disp: 60 tablet, Rfl: 11 .  traZODone (DESYREL) 50 MG tablet, Take 50 mg by mouth at bedtime., Disp: , Rfl: 2 .  warfarin (COUMADIN) 4 MG  tablet, TAKE 1/2 TO 1 TABLET BY MOUTH DAILY AS DIRECTED BY COUMADIN CLINIC, Disp: 30 tablet, Rfl: 2 .  albuterol (PROVENTIL HFA;VENTOLIN HFA) 108 (90 Base) MCG/ACT inhaler, Inhale 2 puffs into the lungs every 6 (six) hours as needed for wheezing or shortness of breath. (Patient not taking: Reported on 09/26/2017), Disp: 1 Inhaler, Rfl: 2 Allergies  Allergen Reactions  . Lactose Intolerance (Gi) Other (See Comments)    Stomach pain  . Milk-Related Compounds Other (See Comments)    Upset stomach   . Sulfa Antibiotics Itching      Social History   Socioeconomic History  . Marital status: Single    Spouse name: Not on file  . Number of children: 0  . Years of education: 70  . Highest education level: Some college, no degree  Occupational History  . Occupation: retired from [employer]  Social Needs  . Financial resource strain: Not on file  . Food insecurity:    Worry: Not on file    Inability: Not on file  . Transportation needs:    Medical: Not on file    Non-medical: Not on file  Tobacco Use  . Smoking status: Former Smoker    Packs/day: 0.00    Years: 30.00    Pack years: 0.00    Types: Cigarettes    Last attempt to quit: 04/21/1975    Years since quitting: 42.5  . Smokeless tobacco: Never Used  Substance and Sexual Activity  . Alcohol use: Not Currently    Comment: occasionally  . Drug use: No  . Sexual activity: Not Currently  Lifestyle  . Physical activity:    Days per week: Not on file    Minutes per session: Not on file  . Stress: Not on file  Relationships  . Social connections:    Talks on phone: Not on file    Gets together: Not on file    Attends religious service: Not on file    Active member of club or organization: Not on file    Attends meetings of clubs or organizations: Not on file    Relationship status: Not on file  . Intimate partner violence:    Fear of current or ex partner: Not on file    Emotionally abused: Not on file     Physically abused: Not on file    Forced sexual activity: Not on file  Other Topics Concern  . Not on file  Social History Narrative   Lives alone.  Has a dog.  No children.  Retired from [employer].  Education: some college.     Physical Exam      Future Appointments  Date Time Provider [place]  10/18/2017  8:45 AM [facility]  10/20/2017 10:00 AM Larey Dresser, MD [facility] None  11/10/2017  8:30 AM [facility] ECHO 3 [id number]  12/21/2017 10:40 AM Troy Sine, MD [facility]  02/01/2018  9:30 AM Posey Pronto, Arvin Collard K, DO [facility] None    BP (!) 104/50   Pulse 62   Resp 15   Wt 133 lb (60.3 kg)   SpO2 98%   BMI 22.83 kg/m   Weight yesterday-130  Last visit weight-140   Pt reports she is doing ok, she has good days and bad, intermittently sob at times, but she thinks she is overdoing it at times. She is independent lady and is used to doing a lot of things herself so she is trying to allow others to help her more. Her weight is fluctuating from 130-140. Her appetite is intermittently as well.  No missed doses of her meds.  Iron is placed in foor tues/thurs/sat and mon.  She needs to get her lasix p/u from pharmacy--she has enough for mon pm-wed pm.  She got the tadalafil refill instead of the alyq this time--will use up the alyq first.  Ordered the ferrous sulfate and lasix for her-she has her inhaler there ready for pick up also--she plans on getting that and begin using when needed.  No swelling noted. Will return for coumadin dose change/lasix placement in pill box.    Marylouise Stacks, [place] [facility] Paramedic  10/17/17

## 2017-10-18 ENCOUNTER — Other Ambulatory Visit (HOSPITAL_COMMUNITY): Payer: Self-pay

## 2017-10-18 ENCOUNTER — Ambulatory Visit: Payer: Medicare HMO | Admitting: Pharmacist

## 2017-10-18 DIAGNOSIS — I480 Paroxysmal atrial fibrillation: Secondary | ICD-10-CM | POA: Diagnosis not present

## 2017-10-18 DIAGNOSIS — Z7901 Long term (current) use of anticoagulants: Secondary | ICD-10-CM

## 2017-10-18 LAB — POCT INR: INR: 1.4 — AB (ref 2.0–3.0)

## 2017-10-18 NOTE — Progress Notes (Signed)
Came out today for coumadin dose change per coumadin clinic she is to take 1/2 tab daily until next check. Med changed in pill box along with the addition of lasix of 80mg  in am and 40mg  in pm  in the pill box as well since she went by pharmacy and picked up her meds.   Marylouise Stacks, EMT-Paramedic  10/18/17

## 2017-10-20 ENCOUNTER — Ambulatory Visit (HOSPITAL_COMMUNITY)
Admission: RE | Admit: 2017-10-20 | Discharge: 2017-10-20 | Disposition: A | Discharge status: Home / Self Care | Payer: Medicare HMO | Source: Ambulatory Visit | Attending: Cardiology | Admitting: Cardiology

## 2017-10-20 ENCOUNTER — Other Ambulatory Visit (HOSPITAL_COMMUNITY): Payer: Self-pay

## 2017-10-20 ENCOUNTER — Encounter (HOSPITAL_COMMUNITY): Payer: Self-pay | Admitting: Cardiology

## 2017-10-20 VITALS — BP 119/58 | HR 64 | Wt 137.0 lb

## 2017-10-20 DIAGNOSIS — Z820 Family history of epilepsy and other diseases of the nervous system: Secondary | ICD-10-CM | POA: Insufficient documentation

## 2017-10-20 DIAGNOSIS — F0390 Unspecified dementia without behavioral disturbance: Secondary | ICD-10-CM | POA: Insufficient documentation

## 2017-10-20 DIAGNOSIS — N183 Chronic kidney disease, stage 3 (moderate): Secondary | ICD-10-CM | POA: Diagnosis not present

## 2017-10-20 DIAGNOSIS — I2721 Secondary pulmonary arterial hypertension: Secondary | ICD-10-CM | POA: Diagnosis not present

## 2017-10-20 DIAGNOSIS — I130 Hypertensive heart and chronic kidney disease with heart failure and stage 1 through stage 4 chronic kidney disease, or unspecified chronic kidney disease: Secondary | ICD-10-CM | POA: Diagnosis not present

## 2017-10-20 DIAGNOSIS — Z7901 Long term (current) use of anticoagulants: Secondary | ICD-10-CM | POA: Diagnosis not present

## 2017-10-20 DIAGNOSIS — Z09 Encounter for follow-up examination after completed treatment for conditions other than malignant neoplasm: Secondary | ICD-10-CM | POA: Diagnosis not present

## 2017-10-20 DIAGNOSIS — Z79899 Other long term (current) drug therapy: Secondary | ICD-10-CM | POA: Insufficient documentation

## 2017-10-20 DIAGNOSIS — I071 Rheumatic tricuspid insufficiency: Secondary | ICD-10-CM | POA: Diagnosis not present

## 2017-10-20 DIAGNOSIS — Z823 Family history of stroke: Secondary | ICD-10-CM | POA: Diagnosis not present

## 2017-10-20 DIAGNOSIS — Z853 Personal history of malignant neoplasm of breast: Secondary | ICD-10-CM | POA: Insufficient documentation

## 2017-10-20 DIAGNOSIS — I495 Sick sinus syndrome: Secondary | ICD-10-CM | POA: Diagnosis not present

## 2017-10-20 DIAGNOSIS — I5032 Chronic diastolic (congestive) heart failure: Secondary | ICD-10-CM

## 2017-10-20 DIAGNOSIS — Z8249 Family history of ischemic heart disease and other diseases of the circulatory system: Secondary | ICD-10-CM | POA: Diagnosis not present

## 2017-10-20 DIAGNOSIS — E1122 Type 2 diabetes mellitus with diabetic chronic kidney disease: Secondary | ICD-10-CM | POA: Diagnosis not present

## 2017-10-20 DIAGNOSIS — I480 Paroxysmal atrial fibrillation: Secondary | ICD-10-CM | POA: Diagnosis not present

## 2017-10-20 DIAGNOSIS — R69 Illness, unspecified: Secondary | ICD-10-CM | POA: Diagnosis not present

## 2017-10-20 LAB — COMPREHENSIVE METABOLIC PANEL
ALBUMIN: 3.9 g/dL (ref 3.5–5.0)
ALT: 11 U/L (ref 0–44)
ANION GAP: 12 (ref 5–15)
AST: 24 U/L (ref 15–41)
Alkaline Phosphatase: 72 U/L (ref 38–126)
BILIRUBIN TOTAL: 0.9 mg/dL (ref 0.3–1.2)
BUN: 38 mg/dL — ABNORMAL HIGH (ref 8–23)
CO2: 24 mmol/L (ref 22–32)
Calcium: 9.2 mg/dL (ref 8.9–10.3)
Chloride: 102 mmol/L (ref 98–111)
Creatinine, Ser: 1.73 mg/dL — ABNORMAL HIGH (ref 0.44–1.00)
GFR calc non Af Amer: 25 mL/min — ABNORMAL LOW (ref >60)
GFR, EST AFRICAN AMERICAN: 29 mL/min — AB (ref >60)
GLUCOSE: 100 mg/dL — AB (ref 70–99)
POTASSIUM: 4.2 mmol/L (ref 3.5–5.1)
Sodium: 138 mmol/L (ref 135–145)
TOTAL PROTEIN: 7.2 g/dL (ref 6.5–8.1)

## 2017-10-20 LAB — TSH: TSH: 0.662 u[IU]/mL (ref 0.350–4.500)

## 2017-10-20 LAB — CBC
HEMATOCRIT: 32 % — AB (ref 36.0–46.0)
Hemoglobin: 9.8 g/dL — ABNORMAL LOW (ref 12.0–15.0)
MCH: 25.9 pg — ABNORMAL LOW (ref 26.0–34.0)
MCHC: 30.6 g/dL (ref 30.0–36.0)
MCV: 84.4 fL (ref 78.0–100.0)
Platelets: 235 10*3/uL (ref 150–400)
RBC: 3.79 MIL/uL — ABNORMAL LOW (ref 3.87–5.11)
RDW: 16.1 % — AB (ref 11.5–15.5)
WBC: 4.2 10*3/uL (ref 4.0–10.5)

## 2017-10-20 MED ORDER — TRAZODONE HCL 100 MG PO TABS: 100.0000 mg | ORAL_TABLET | Freq: Every day | ORAL | 3 refills | Status: DC

## 2017-10-20 NOTE — Progress Notes (Signed)
PCP: Dr. Mancel Bale Cardiology: Dr. Claiborne Billings HF Cardiology: Dr. Aundra Dubin  84 y.o. with history of CKD stage 3, SSS with Medtronic PPM, paroxysmal atrial fibrillation, and chronic diastolic CHF was referred by Dr. Claiborne Billings for evaluation of CHF/pulmonary hypertension. She has had noticeable exertional dyspnea for over a year. It has worsened gradually. Currently, she is short of breath with most ADLs.  Most recent echo in 11/18 showed normal EF with severe LVH, restrictive diastolic function, moderately decreased RV systolic function, and severe pulmonary hypertension.  RHC in 1/19 showed severe PAH and mildly elevated PCWP. V/Q scan showed no chronic PEs and PFTs showed minimal obstructive/restriction.   She was admitted in 3/19 for CHF exacerbation and treated with diuresis.  Weight is down 7 lbs today.  She is now in a SNF for rehab.   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   She returns for followup of CHF and pulmonary hypertension.  She is living at home alone.  Weight down 6 more lbs, says her appetite is poor. Has epigastric/peri-umbilical discomfort at times.  She is short of breath walking from room to room in her apartment, this is unchanged.  No orthopnea/PND.  No lightheadedness.  No chest pain.  No falls.  She remains very limited.  She does report tingling in her feet consistent with neuropathy. She is followed by paramedicine.   Labs (9/18): K 4.3, creatinine 1.68 Labs (11/18): TSH normal, LFTs normal, NT-proBNP 4311, hgb 9.7 Labs (1/19): K 4.4, creatinine 1.58, hgb 10 Labs (3/19): K 4.1, creatinine 1.48 => 1.87, hgb 9.5, urine immunofixation negative, ANA negative, anti-Jo1 antibody negative, anti-SCL70 negative, SPEP with faint M-spike, RF negative. LFTs/TSH normal.  Labs (5/19): K 3.2, creatinine 1.68  PMH: 1. CKD stage 3 2. Right breast cancer.  3. Atrial fibrillation: Paroxysmal.  H/o DCCV.  On  amiodarone to maintain NSR.   4. Right TKR 2015 5. Sick sinus syndrome: Medtronic PPM 5/17.  6. Type II diabetes 7. HTN 8. Chronic diastolic CHF: Echo (16/10) with EF 65-70%, severe LVH, restrictive diastolic function, mild MR, mild AI, moderately dilated RV with moderately decreased systolic function, PASP 79 mmHg, severe TR.  - TcPYP scan (2/19): Suggestive of TTR amyloidosis.  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  9. Pulmonary hypertension:  - RHC (1/19): mean RA 8, PA 72/22 mean 41, mean PCWP 20, CI 2.22, PVR 5.4 WU. - PFTs (2/19): minimal obstruction, mild restriction, severely decreased DLCO.  - V/Q scan (2/19): No chronic or acute PE. 10. Severe TR 11. Dementia  Social History   Socioeconomic History  . Marital status: Single    Spouse name: Not on file  . Number of children: 0  . Years of education: 7  . Highest education level: Some college, no degree  Occupational History  . Occupation: retired from [employer]  Social Needs  . Financial resource strain: Not on file  . Food insecurity:    Worry: Not on file    Inability: Not on file  . Transportation needs:    Medical: Not on file    Non-medical: Not on file  Tobacco Use  . Smoking status: Former Smoker    Packs/day: 0.00    Years: 30.00    Pack years: 0.00    Types: Cigarettes    Last attempt to quit: 04/21/1975    Years since quitting: 42.5  . Smokeless tobacco: Never  Used  Substance and Sexual Activity  . Alcohol use: Not Currently    Comment: occasionally  . Drug use: No  . Sexual activity: Not Currently  Lifestyle  . Physical activity:    Days per week: Not on file    Minutes per session: Not on file  . Stress: Not on file  Relationships  . Social connections:    Talks on phone: Not on file    Gets together: Not on file    Attends religious service: Not on file    Active member of club or organization: Not on file    Attends meetings of clubs or organizations:  Not on file    Relationship status: Not on file  . Intimate partner violence:    Fear of current or ex partner: Not on file    Emotionally abused: Not on file    Physically abused: Not on file    Forced sexual activity: Not on file  Other Topics Concern  . Not on file  Social History Narrative   Lives alone.  Has a dog.  No children.  Retired from Equities trader.  Education: some college.    Family History  Problem Relation Age of Onset  . Heart disease Mother   . Stroke Mother   . Hypertension Mother   . Stroke Father   . Hypertension Father   . Stroke Brother   . Heart attack Neg Hx   . Neuropathy Neg Hx    ROS: All systems reviewed and negative except as per HPI.   Current Outpatient Medications  Medication Sig Dispense Refill  . albuterol (PROVENTIL HFA;VENTOLIN HFA) 108 (90 Base) MCG/ACT inhaler Inhale 2 puffs into the lungs every 6 (six) hours as needed for wheezing or shortness of breath. 1 Inhaler 2  . amiodarone (PACERONE) 200 MG tablet Take 0.5 tablets (100 mg total) by mouth daily. 45 tablet 2  . carvedilol (COREG) 3.125 MG tablet TAKE 1 TABLET (3.125 MG TOTAL) BY MOUTH 2 (TWO) TIMES DAILY WITH A MEAL. 60 tablet 1  . donepezil (ARICEPT) 5 MG tablet Take 1 tablet (5 mg total) by mouth at bedtime. 30 tablet 0  . ferrous sulfate 325 (65 FE) MG tablet Take 1 tablet (325 mg total) by mouth every other day. (Patient taking differently: Take 325 mg by mouth every other day. ) 30 tablet 3  . furosemide (LASIX) 80 MG tablet TAKE 1 TABLET BY MOUTH TWICE A DAY FOR EDEMA/HIGH BP 60 tablet 1  . HYDROcodone-acetaminophen (NORCO) 7.5-325 MG tablet Take 1 tablet by mouth every 6 (six) hours as needed for moderate pain. 30 tablet 0  . latanoprost (XALATAN) 0.005 % ophthalmic solution Place 1 drop into both eyes at bedtime.    Marland Kitchen LORazepam (ATIVAN) 1 MG tablet Take 1 mg by mouth at bedtime as needed.  2  . macitentan (OPSUMIT) 10 MG tablet Take 1 tablet (10 mg total) by  mouth daily. 30 tablet 11  . Melatonin 3 MG TABS Take 9 mg by mouth.    . oxybutynin (DITROPAN) 5 MG tablet Take 5 mg by mouth 3 (three) times daily.    . polyethylene glycol (MIRALAX / GLYCOLAX) packet Take 17 g by mouth daily. 14 each 0  . potassium chloride SA (K-DUR,KLOR-CON) 20 MEQ tablet Take 1 tablet (20 mEq total) by mouth daily. 90 tablet 3  . sertraline (ZOLOFT) 25 MG tablet Take 1 tablet (25 mg total) by mouth daily. 30 tablet 0  .  tadalafil, PAH, (ADCIRCA) 20 MG tablet Take 2 tablets (40 mg total) by mouth daily. 60 tablet 11  . traZODone (DESYREL) 100 MG tablet Take 1 tablet (100 mg total) by mouth at bedtime. 30 tablet 3  . warfarin (COUMADIN) 4 MG tablet TAKE 1/2 TO 1 TABLET BY MOUTH DAILY AS DIRECTED BY COUMADIN CLINIC 30 tablet 2   No current facility-administered medications for this encounter.    BP (!) 119/58   Pulse 64   Wt 137 lb (62.1 kg)   SpO2 100% Comment: on 2L  BMI 23.52 kg/m  General: NAD Neck: No JVD, no thyromegaly or thyroid nodule.  Lungs: Mild crackles at bases bilaterally.  CV: Nondisplaced PMI.  Heart regular S1/S2, no S3/S4, 2/6 HSM LLSB.  No peripheral edema.  No carotid bruit.  Normal pedal pulses.  Abdomen: Soft, nontender, no hepatosplenomegaly, no distention.  Skin: Intact without lesions or rashes.  Neurologic: Alert and oriented x 3.  Psych: Normal affect. Extremities: No clubbing or cyanosis.  HEENT: Normal.    Assessment/Plan: 1. Atrial fibrillation: Paroxysmal.  She is in NSR today on amiodarone, denies palpitations.  - Continue warfarin.  - She will continue amiodarone, CMET/TSH today.  She will need regular eye exam.  2. Chronic diastolic CHF: Echo 82/95 with normal EF, severe LVH, moderate RV systolic function, severe pulmonary hypertension.  Severe LVH in this patient raises concern for either long-standing poorly controlled HTN or cardiac amyloidosis => TcPYP scan was suggestive of transthyretin amyloidosis, genetic testing suggests  hereditary TTR amyloidosis with Val142Ile.  Cannot do cardiac MRI because of pacemaker.  NYHA class III symptoms but she does not appear volume overloaded on exam and weight is down.  - Continue Lasix 80 qam/40 qpm.  BMET today.   - She is limited but lives on her own still.  I am going to start her on tafamidis for treatment of hereditary amyloidosis. She has no children but does have brothers and sisters.  She has told them about her amyloidosis diagnosis and they are thinking about genetic testing.   3. HTN: BP stable on Coreg 3.125 mg bid.  4. Pulmonary hypertension: Severe pulmonary hypertension on echo.  RHC showed pulmonary arterial hypertension.  I am concerned for group 1 PH.  V/Q scan with no chronic PE, PFTs not markedly abnormal.  Serological workup was negative.  - Continue Opsumit 10 mg daily.  - Continue Adcirca 40 mg daily.   - She does not think she can do a 6 minute walk.  5. CKD: Stage 3.  BMET today.  6. Tricuspid regurgitation: Severe on last echo, has TR murmur.  7. Sick sinus syndrome: Medtronic PPM.    Followup in 6 wks.   Loralie Champagne 10/20/2017

## 2017-10-20 NOTE — Progress Notes (Signed)
Paramedicine Encounter   Patient ID: Natasha Fletcher , female,   DOB: 07/12/1925,84 y.o.,  MRN: [id number]   Met patient in clinic today with provider.  Time spent with patient 60mn  Pt reports she was feeling bad earlier, spells seem to come and go--possible anxiety exacerbates the sob as well. She can increase her trazadone to help with sleeping and  continue the melatonin.  Leaving lasix as 862min am and 40 in pm.  Will see her next Monday.    Ka[place]louise StacksEM[place]okside/18/2019   ACTION: Home visit completed

## 2017-10-20 NOTE — Patient Instructions (Addendum)
Increase Trazodone to 100 mg at night to help you sleep  We are waiting for your insurance to approve the Vyndaqel (Tafamidis) once approved the pharmacy will contact you before shipping the medication, you will take 80 mg (4 capsules) daily when you start the medication  Labs today  Your physician recommends that you schedule a follow-up appointment in: 6 weeks

## 2017-10-24 ENCOUNTER — Other Ambulatory Visit (HOSPITAL_COMMUNITY): Payer: Self-pay

## 2017-10-24 NOTE — Progress Notes (Signed)
Paramedicine Encounter    Patient ID: Natasha Fletcher, female    DOB: 1926/01/12, 84 y.o.   MRN: [id number]   Patient Care Team: Lorene Dy, MD as PCP - General (Internal Medicine) Troy Sine, MD as PCP - Cardiology (Cardiology) Constance Haw, MD as Consulting Physician (Cardiology)  Patient Active Problem List   Diagnosis Date Noted  . Cognitive decline   . Palliative care by specialist   . DNR (do not resuscitate) discussion   . Acute on chronic congestive heart failure ([place])   . Acute on chronic diastolic CHF (congestive heart failure) ([place])   . Sick sinus syndrome due to SA node dysfunction (HCC)   . Shortness of breath 06/13/2017  . Atrial fibrillation ([place]) 03/22/2016  . Bradycardia 08/22/2015  . Junctional (nodal) bradycardia 08/22/2015  . Malnutrition of moderate degree 03/19/2015  . Benign essential HTN   . Bradycardia, drug induced 03/18/2015  . Hypokalemia 02/16/2015  . Thrombocytopenia ([place]) 02/16/2015  . Sepsis due to urinary tract infection ([place])   . Sepsis ([place]) 02/12/2015  . Urinary tract infectious disease   . Long-term (current) use of anticoagulants 07/12/2014  . Chronic anticoagulation 02/11/2014  . Hypertensive cardiovascular disease 01/24/2014  . Chronic diastolic CHF (congestive heart failure) ([place]) 01/24/2014  . Anemia 01/24/2014  . Breast cancer of upper-inner quadrant of right female breast ([place]) 01/24/2014  . Acute diastolic heart failure ([place]) 11/11/2013  . Atrial flutter ([place]) 11/11/2013  . PAF (paroxysmal atrial fibrillation) ([place]) 11/10/2013  . Slurred speech 11/10/2013  . Protein-calorie malnutrition, severe ([place]) 11/06/2013  . Coffee ground emesis 11/05/2013  . Dehydration 10/19/2013  . Urinary retention 10/18/2013  . GERD (gastroesophageal reflux disease) 03/20/2013  . S/P total knee arthroplasty   . UTI (urinary tract infection) 02/21/2013  . OA (osteoarthritis) of knee 02/19/2013  . Hypertension   . Thyroid disease    . Cancer of central portion of female breast ([place]) 04/14/2011    Current Outpatient Medications:  .  amiodarone (PACERONE) 200 MG tablet, Take 0.5 tablets (100 mg total) by mouth daily., Disp: 45 tablet, Rfl: 2 .  carvedilol (COREG) 3.125 MG tablet, TAKE 1 TABLET (3.125 MG TOTAL) BY MOUTH 2 (TWO) TIMES DAILY WITH A MEAL., Disp: 60 tablet, Rfl: 1 .  donepezil (ARICEPT) 5 MG tablet, Take 1 tablet (5 mg total) by mouth at bedtime., Disp: 30 tablet, Rfl: 0 .  ferrous sulfate 325 (65 FE) MG tablet, Take 1 tablet (325 mg total) by mouth every other day. (Patient taking differently: Take 325 mg by mouth every other day. ), Disp: 30 tablet, Rfl: 3 .  furosemide (LASIX) 80 MG tablet, TAKE 1 TABLET BY MOUTH TWICE A DAY FOR EDEMA/HIGH BP (Patient taking differently: TAKE 1 TABLET BY MOUTH TWICE A DAY FOR EDEMA/HIGH BP), Disp: 60 tablet, Rfl: 1 .  HYDROcodone-acetaminophen (NORCO) 7.5-325 MG tablet, Take 1 tablet by mouth every 6 (six) hours as needed for moderate pain., Disp: 30 tablet, Rfl: 0 .  latanoprost (XALATAN) 0.005 % ophthalmic solution, Place 1 drop into both eyes at bedtime., Disp: , Rfl:  .  LORazepam (ATIVAN) 1 MG tablet, Take 1 mg by mouth at bedtime as needed., Disp: , Rfl: 2 .  macitentan (OPSUMIT) 10 MG tablet, Take 1 tablet (10 mg total) by mouth daily., Disp: 30 tablet, Rfl: 11 .  Melatonin 3 MG TABS, Take 9 mg by mouth., Disp: , Rfl:  .  oxybutynin (DITROPAN) 5 MG tablet, Take 5 mg by mouth 3 (three)  times daily., Disp: , Rfl:  .  polyethylene glycol (MIRALAX / GLYCOLAX) packet, Take 17 g by mouth daily., Disp: 14 each, Rfl: 0 .  potassium chloride SA (K-DUR,KLOR-CON) 20 MEQ tablet, Take 1 tablet (20 mEq total) by mouth daily., Disp: 90 tablet, Rfl: 3 .  sertraline (ZOLOFT) 25 MG tablet, Take 1 tablet (25 mg total) by mouth daily., Disp: 30 tablet, Rfl: 0 .  tadalafil, PAH, (ADCIRCA) 20 MG tablet, Take 2 tablets (40 mg total) by mouth daily., Disp: 60 tablet, Rfl: 11 .  traZODone  (DESYREL) 100 MG tablet, Take 1 tablet (100 mg total) by mouth at bedtime., Disp: 30 tablet, Rfl: 3 .  warfarin (COUMADIN) 4 MG tablet, TAKE 1/2 TO 1 TABLET BY MOUTH DAILY AS DIRECTED BY COUMADIN CLINIC, Disp: 30 tablet, Rfl: 2 .  albuterol (PROVENTIL HFA;VENTOLIN HFA) 108 (90 Base) MCG/ACT inhaler, Inhale 2 puffs into the lungs every 6 (six) hours as needed for wheezing or shortness of breath. (Patient not taking: Reported on 10/24/2017), Disp: 1 Inhaler, Rfl: 2 Allergies  Allergen Reactions  . Lactose Intolerance (Gi) Other (See Comments)    Stomach pain  . Milk-Related Compounds Other (See Comments)    Upset stomach   . Sulfa Antibiotics Itching      Social History   Socioeconomic History  . Marital status: Single    Spouse name: Not on file  . Number of children: 0  . Years of education: 35  . Highest education level: Some college, no degree  Occupational History  . Occupation: retired from [employer]  Social Needs  . Financial resource strain: Not on file  . Food insecurity:    Worry: Not on file    Inability: Not on file  . Transportation needs:    Medical: Not on file    Non-medical: Not on file  Tobacco Use  . Smoking status: Former Smoker    Packs/day: 0.00    Years: 30.00    Pack years: 0.00    Types: Cigarettes    Last attempt to quit: 04/21/1975    Years since quitting: 42.5  . Smokeless tobacco: Never Used  Substance and Sexual Activity  . Alcohol use: Not Currently    Comment: occasionally  . Drug use: No  . Sexual activity: Not Currently  Lifestyle  . Physical activity:    Days per week: Not on file    Minutes per session: Not on file  . Stress: Not on file  Relationships  . Social connections:    Talks on phone: Not on file    Gets together: Not on file    Attends religious service: Not on file    Active member of club or organization: Not on file    Attends meetings of clubs or organizations: Not on file    Relationship status:  Not on file  . Intimate partner violence:    Fear of current or ex partner: Not on file    Emotionally abused: Not on file    Physically abused: Not on file    Forced sexual activity: Not on file  Other Topics Concern  . Not on file  Social History Narrative   Lives alone.  Has a dog.  No children.  Retired from Equities trader.  Education: some college.     Physical Exam      Future Appointments  Date Time Provider [place]  11/04/2017  8:30 AM [facility]  11/10/2017  8:30 AM [facility] ECHO 3 [facility]  12/09/2017  9:00 AM Larey Dresser, MD MC-[facility] None  12/21/2017 10:40 AM Troy Sine, MD [facility]NORTHLIN [facility]  02/01/2018  9:30 AM Posey Pronto, Arvin Collard K, DO LBN-LBNG None    BP (!) 100/54   Pulse 68   Resp 15   Wt 138 lb (62.6 kg)   SpO2 98%   BMI 23.69 kg/m   Weight yesterday-140 Last visit weight-137 @ clinic   Pt reports she is doing better now, she had a rough day but she reports now its better. She reports her pains was bothering her today. No missed doses of her meds. meds verified and pill box refilled. Her lasix dose is 80mg  in am and 40mg  in pm. It needs to be updated in epic.  Pt denies any sob at this time. No dizziness. No h/a.  Pt states pharmacy called her today stating she had 3 to pick up. I called to confirm which ones it was--it was carvedilol, trazadone, and sertraline and donepazil will be ready on Friday. Her brother will p/u meds then.   Marylouise Stacks, [place] [facility] Paramedic  10/24/17

## 2017-10-28 ENCOUNTER — Encounter: Payer: Self-pay | Admitting: Cardiology

## 2017-10-31 ENCOUNTER — Other Ambulatory Visit (HOSPITAL_COMMUNITY): Payer: Self-pay

## 2017-10-31 DIAGNOSIS — I5022 Chronic systolic (congestive) heart failure: Secondary | ICD-10-CM | POA: Diagnosis not present

## 2017-10-31 DIAGNOSIS — R5381 Other malaise: Secondary | ICD-10-CM | POA: Diagnosis not present

## 2017-10-31 DIAGNOSIS — R0602 Shortness of breath: Secondary | ICD-10-CM | POA: Diagnosis not present

## 2017-10-31 DIAGNOSIS — A419 Sepsis, unspecified organism: Secondary | ICD-10-CM | POA: Diagnosis not present

## 2017-10-31 DIAGNOSIS — D485 Neoplasm of uncertain behavior of skin: Secondary | ICD-10-CM | POA: Diagnosis not present

## 2017-10-31 DIAGNOSIS — M545 Low back pain: Secondary | ICD-10-CM | POA: Diagnosis not present

## 2017-10-31 DIAGNOSIS — R269 Unspecified abnormalities of gait and mobility: Secondary | ICD-10-CM | POA: Diagnosis not present

## 2017-10-31 DIAGNOSIS — M2550 Pain in unspecified joint: Secondary | ICD-10-CM | POA: Diagnosis not present

## 2017-10-31 DIAGNOSIS — S83509A Sprain of unspecified cruciate ligament of unspecified knee, initial encounter: Secondary | ICD-10-CM | POA: Diagnosis not present

## 2017-10-31 DIAGNOSIS — I5032 Chronic diastolic (congestive) heart failure: Secondary | ICD-10-CM | POA: Diagnosis not present

## 2017-10-31 DIAGNOSIS — Z5189 Encounter for other specified aftercare: Secondary | ICD-10-CM | POA: Diagnosis not present

## 2017-10-31 DIAGNOSIS — Z9181 History of falling: Secondary | ICD-10-CM | POA: Diagnosis not present

## 2017-10-31 NOTE — Progress Notes (Signed)
Paramedicine Encounter    Patient ID: Natasha Fletcher, female    DOB: 10/23/1925, 84 y.o.   MRN: [id number]   Patient Care Team: Lorene Dy, MD as PCP - General (Internal Medicine) Troy Sine, MD as PCP - Cardiology (Cardiology) Constance Haw, MD as Consulting Physician (Cardiology)  Patient Active Problem List   Diagnosis Date Noted  . Cognitive decline   . Palliative care by specialist   . DNR (do not resuscitate) discussion   . Acute on chronic congestive heart failure ([place])   . Acute on chronic diastolic CHF (congestive heart failure) ([place])   . Sick sinus syndrome due to SA node dysfunction (HCC)   . Shortness of breath 06/13/2017  . Atrial fibrillation ([place]) 03/22/2016  . Bradycardia 08/22/2015  . Junctional (nodal) bradycardia 08/22/2015  . Malnutrition of moderate degree 03/19/2015  . Benign essential HTN   . Bradycardia, drug induced 03/18/2015  . Hypokalemia 02/16/2015  . Thrombocytopenia ([place]) 02/16/2015  . Sepsis due to urinary tract infection ([place])   . Sepsis ([place]) 02/12/2015  . Urinary tract infectious disease   . Long-term (current) use of anticoagulants 07/12/2014  . Chronic anticoagulation 02/11/2014  . Hypertensive cardiovascular disease 01/24/2014  . Chronic diastolic CHF (congestive heart failure) ([place]) 01/24/2014  . Anemia 01/24/2014  . Breast cancer of upper-inner quadrant of right female breast ([place]) 01/24/2014  . Acute diastolic heart failure ([place]) 11/11/2013  . Atrial flutter ([place]) 11/11/2013  . PAF (paroxysmal atrial fibrillation) ([place]) 11/10/2013  . Slurred speech 11/10/2013  . Protein-calorie malnutrition, severe ([place]) 11/06/2013  . Coffee ground emesis 11/05/2013  . Dehydration 10/19/2013  . Urinary retention 10/18/2013  . GERD (gastroesophageal reflux disease) 03/20/2013  . S/P total knee arthroplasty   . UTI (urinary tract infection) 02/21/2013  . OA (osteoarthritis) of knee 02/19/2013  . Hypertension   . Thyroid disease    . Cancer of central portion of female breast ([place]) 04/14/2011    Current Outpatient Medications:  .  amiodarone (PACERONE) 200 MG tablet, Take 0.5 tablets (100 mg total) by mouth daily., Disp: 45 tablet, Rfl: 2 .  carvedilol (COREG) 3.125 MG tablet, TAKE 1 TABLET (3.125 MG TOTAL) BY MOUTH 2 (TWO) TIMES DAILY WITH A MEAL., Disp: 60 tablet, Rfl: 1 .  donepezil (ARICEPT) 5 MG tablet, Take 1 tablet (5 mg total) by mouth at bedtime., Disp: 30 tablet, Rfl: 0 .  ferrous sulfate 325 (65 FE) MG tablet, Take 1 tablet (325 mg total) by mouth every other day. (Patient taking differently: Take 325 mg by mouth every other day. ), Disp: 30 tablet, Rfl: 3 .  furosemide (LASIX) 80 MG tablet, TAKE 1 TABLET BY MOUTH TWICE A DAY FOR EDEMA/HIGH BP (Patient taking differently: TAKE 1 TABLET BY MOUTH TWICE A DAY FOR EDEMA/HIGH BP), Disp: 60 tablet, Rfl: 1 .  HYDROcodone-acetaminophen (NORCO) 7.5-325 MG tablet, Take 1 tablet by mouth every 6 (six) hours as needed for moderate pain., Disp: 30 tablet, Rfl: 0 .  latanoprost (XALATAN) 0.005 % ophthalmic solution, Place 1 drop into both eyes at bedtime., Disp: , Rfl:  .  LORazepam (ATIVAN) 1 MG tablet, Take 1 mg by mouth at bedtime as needed., Disp: , Rfl: 2 .  macitentan (OPSUMIT) 10 MG tablet, Take 1 tablet (10 mg total) by mouth daily., Disp: 30 tablet, Rfl: 11 .  Melatonin 3 MG TABS, Take 9 mg by mouth., Disp: , Rfl:  .  oxybutynin (DITROPAN) 5 MG tablet, Take 5 mg by mouth 3 (three)  times daily., Disp: , Rfl:  .  polyethylene glycol (MIRALAX / GLYCOLAX) packet, Take 17 g by mouth daily., Disp: 14 each, Rfl: 0 .  potassium chloride SA (K-DUR,KLOR-CON) 20 MEQ tablet, Take 1 tablet (20 mEq total) by mouth daily., Disp: 90 tablet, Rfl: 3 .  sertraline (ZOLOFT) 25 MG tablet, Take 1 tablet (25 mg total) by mouth daily., Disp: 30 tablet, Rfl: 0 .  tadalafil, PAH, (ADCIRCA) 20 MG tablet, Take 2 tablets (40 mg total) by mouth daily., Disp: 60 tablet, Rfl: 11 .  traZODone  (DESYREL) 100 MG tablet, Take 1 tablet (100 mg total) by mouth at bedtime., Disp: 30 tablet, Rfl: 3 .  warfarin (COUMADIN) 4 MG tablet, TAKE 1/2 TO 1 TABLET BY MOUTH DAILY AS DIRECTED BY COUMADIN CLINIC, Disp: 30 tablet, Rfl: 2 .  albuterol (PROVENTIL HFA;VENTOLIN HFA) 108 (90 Base) MCG/ACT inhaler, Inhale 2 puffs into the lungs every 6 (six) hours as needed for wheezing or shortness of breath. (Patient not taking: Reported on 10/24/2017), Disp: 1 Inhaler, Rfl: 2 Allergies  Allergen Reactions  . Lactose Intolerance (Gi) Other (See Comments)    Stomach pain  . Milk-Related Compounds Other (See Comments)    Upset stomach   . Sulfa Antibiotics Itching      Social History   Socioeconomic History  . Marital status: Single    Spouse name: Not on file  . Number of children: 0  . Years of education: 71  . Highest education level: Some college, no degree  Occupational History  . Occupation: retired from [employer]  Social Needs  . Financial resource strain: Not on file  . Food insecurity:    Worry: Not on file    Inability: Not on file  . Transportation needs:    Medical: Not on file    Non-medical: Not on file  Tobacco Use  . Smoking status: Former Smoker    Packs/day: 0.00    Years: 30.00    Pack years: 0.00    Types: Cigarettes    Last attempt to quit: 04/21/1975    Years since quitting: 42.5  . Smokeless tobacco: Never Used  Substance and Sexual Activity  . Alcohol use: Not Currently    Comment: occasionally  . Drug use: No  . Sexual activity: Not Currently  Lifestyle  . Physical activity:    Days per week: Not on file    Minutes per session: Not on file  . Stress: Not on file  Relationships  . Social connections:    Talks on phone: Not on file    Gets together: Not on file    Attends religious service: Not on file    Active member of club or organization: Not on file    Attends meetings of clubs or organizations: Not on file    Relationship status:  Not on file  . Intimate partner violence:    Fear of current or ex partner: Not on file    Emotionally abused: Not on file    Physically abused: Not on file    Forced sexual activity: Not on file  Other Topics Concern  . Not on file  Social History Narrative   Lives alone.  Has a dog.  No children.  Retired from Equ[employer].  Education: some college.     Physical Exam      Future Appointments  Date Time Provider [place]  11/04/2017  8:30 AM [facility]  11/10/2017  8:30 AM MC-CV [facility] ECHO 3 [facility]  12/09/2017  9:00 AM Larey Dresser, MD MC-HVSC None  12/21/2017 10:40 AM Troy Sine, MD CVD-NORTHLIN [facility]  02/01/2018  9:30 AM Posey Pronto, Arvin Collard K, DO LBN-LB[facility] None    BP 110/60   Pulse 64   Resp 15   Wt 136 lb (61.7 kg)   SpO2 98%   BMI 23.34 kg/m   Weight yesterday-131 Last visit weight-138  Pt reports she is doing ok today, she goes to PCP for a check up and renewal of some of her meds. She asked if there was anything she can take for her appetite and I advised her to ask her PCP today if there was anything that could be prescribed.  Pt states she has good days and bad days.  No missed doses of her meds this week.  meds verified and pill box refilled.  Some one from [place] for the new medication for amyloidosis called and left a number for pt to return call, so we called that number back-they needed a home address so that was confirmed. She reports that someone from speciality pharmacy will be contacting her for the co-payment and if she needs further assistance then to call that number back @ [phone].  Also called in opsumit for refills.  I advised her to call me if her PCP changes any meds so it can be placed in pill box. If no changes then I will see her again next Monday.    Marylouise Stacks, [facility] Paramedic  10/31/17

## 2017-11-01 DIAGNOSIS — N312 Flaccid neuropathic bladder, not elsewhere classified: Secondary | ICD-10-CM | POA: Diagnosis not present

## 2017-11-04 ENCOUNTER — Ambulatory Visit (INDEPENDENT_AMBULATORY_CARE_PROVIDER_SITE_OTHER): Payer: Medicare HMO | Admitting: Pharmacist

## 2017-11-04 DIAGNOSIS — I480 Paroxysmal atrial fibrillation: Secondary | ICD-10-CM

## 2017-11-04 DIAGNOSIS — Z7901 Long term (current) use of anticoagulants: Secondary | ICD-10-CM | POA: Diagnosis not present

## 2017-11-04 LAB — POCT INR: INR: 1.5 — AB (ref 2.0–3.0)

## 2017-11-07 ENCOUNTER — Other Ambulatory Visit (HOSPITAL_COMMUNITY): Payer: Self-pay | Admitting: Cardiology

## 2017-11-07 ENCOUNTER — Other Ambulatory Visit (HOSPITAL_COMMUNITY): Payer: Self-pay

## 2017-11-07 NOTE — Progress Notes (Addendum)
Paramedicine Encounter    Patient ID: Natasha Fletcher, female    DOB: 05-24-1925, 84 y.o.   MRN: [id number]   Patient Care Team: Lorene Dy, MD as PCP - General (Internal Medicine) Troy Sine, MD as PCP - Cardiology (Cardiology) Constance Haw, MD as Consulting Physician (Cardiology)  Patient Active Problem List   Diagnosis Date Noted  . Cognitive decline   . Palliative care by specialist   . DNR (do not resuscitate) discussion   . Acute on chronic congestive heart failure ([place])   . Acute on chronic diastolic CHF (congestive heart failure) ([place])   . Sick sinus syndrome due to SA node dysfunction (HCC)   . Shortness of breath 06/13/2017  . Atrial fibrillation ([place]) 03/22/2016  . Bradycardia 08/22/2015  . Junctional (nodal) bradycardia 08/22/2015  . Malnutrition of moderate degree 03/19/2015  . Benign essential HTN   . Bradycardia, drug induced 03/18/2015  . Hypokalemia 02/16/2015  . Thrombocytopenia ([place]) 02/16/2015  . Sepsis due to urinary tract infection ([place])   . Sepsis ([place]) 02/12/2015  . Urinary tract infectious disease   . Long-term (current) use of anticoagulants 07/12/2014  . Chronic anticoagulation 02/11/2014  . Hypertensive cardiovascular disease 01/24/2014  . Chronic diastolic CHF (congestive heart failure) ([place]) 01/24/2014  . Anemia 01/24/2014  . Breast cancer of upper-inner quadrant of right female breast ([place]) 01/24/2014  . Acute diastolic heart failure ([place]) 11/11/2013  . Atrial flutter ([place]) 11/11/2013  . PAF (paroxysmal atrial fibrillation) ([place]) 11/10/2013  . Slurred speech 11/10/2013  . Protein-calorie malnutrition, severe ([place]) 11/06/2013  . Coffee ground emesis 11/05/2013  . Dehydration 10/19/2013  . Urinary retention 10/18/2013  . GERD (gastroesophageal reflux disease) 03/20/2013  . S/P total knee arthroplasty   . UTI (urinary tract infection) 02/21/2013  . OA (osteoarthritis) of knee 02/19/2013  . Hypertension   . Thyroid disease    . Cancer of central portion of female breast ([place]) 04/14/2011    Current Outpatient Medications:  .  amiodarone (PACERONE) 200 MG tablet, Take 0.5 tablets (100 mg total) by mouth daily., Disp: 45 tablet, Rfl: 2 .  carvedilol (COREG) 3.125 MG tablet, TAKE 1 TABLET (3.125 MG TOTAL) BY MOUTH 2 (TWO) TIMES DAILY WITH A MEAL., Disp: 60 tablet, Rfl: 1 .  donepezil (ARICEPT) 5 MG tablet, Take 1 tablet (5 mg total) by mouth at bedtime., Disp: 30 tablet, Rfl: 0 .  ferrous sulfate 325 (65 FE) MG tablet, Take 1 tablet (325 mg total) by mouth every other day. (Patient taking differently: Take 325 mg by mouth every other day. ), Disp: 30 tablet, Rfl: 3 .  furosemide (LASIX) 80 MG tablet, TAKE 1 TABLET BY MOUTH TWICE A DAY FOR EDEMA/HIGH BP (Patient taking differently: TAKE 1 TABLET BY MOUTH TWICE A DAY FOR EDEMA/HIGH BP), Disp: 60 tablet, Rfl: 1 .  HYDROcodone-acetaminophen (NORCO) 7.5-325 MG tablet, Take 1 tablet by mouth every 6 (six) hours as needed for moderate pain., Disp: 30 tablet, Rfl: 0 .  latanoprost (XALATAN) 0.005 % ophthalmic solution, Place 1 drop into both eyes at bedtime., Disp: , Rfl:  .  LORazepam (ATIVAN) 1 MG tablet, Take 1 mg by mouth at bedtime as needed., Disp: , Rfl: 2 .  macitentan (OPSUMIT) 10 MG tablet, Take 1 tablet (10 mg total) by mouth daily., Disp: 30 tablet, Rfl: 11 .  Melatonin 3 MG TABS, Take 9 mg by mouth., Disp: , Rfl:  .  oxybutynin (DITROPAN) 5 MG tablet, Take 5 mg by mouth 3 (three)  times daily., Disp: , Rfl:  .  polyethylene glycol (MIRALAX / GLYCOLAX) packet, Take 17 g by mouth daily., Disp: 14 each, Rfl: 0 .  potassium chloride SA (K-DUR,KLOR-CON) 20 MEQ tablet, Take 1 tablet (20 mEq total) by mouth daily., Disp: 90 tablet, Rfl: 3 .  sertraline (ZOLOFT) 25 MG tablet, Take 1 tablet (25 mg total) by mouth daily., Disp: 30 tablet, Rfl: 0 .  tadalafil, PAH, (ADCIRCA) 20 MG tablet, Take 2 tablets (40 mg total) by mouth daily., Disp: 60 tablet, Rfl: 11 .  traZODone  (DESYREL) 100 MG tablet, Take 1 tablet (100 mg total) by mouth at bedtime., Disp: 30 tablet, Rfl: 3 .  warfarin (COUMADIN) 4 MG tablet, TAKE 1/2 TO 1 TABLET BY MOUTH DAILY AS DIRECTED BY COUMADIN CLINIC, Disp: 30 tablet, Rfl: 2 .  albuterol (PROVENTIL HFA;VENTOLIN HFA) 108 (90 Base) MCG/ACT inhaler, Inhale 2 puffs into the lungs every 6 (six) hours as needed for wheezing or shortness of breath. (Patient not taking: Reported on 10/24/2017), Disp: 1 Inhaler, Rfl: 2 Allergies  Allergen Reactions  . Lactose Intolerance (Gi) Other (See Comments)    Stomach pain  . Milk-Related Compounds Other (See Comments)    Upset stomach   . Sulfa Antibiotics Itching      Social History   Socioeconomic History  . Marital status: Single    Spouse name: Not on file  . Number of children: 0  . Years of education: 23  . Highest education level: Some college, no degree  Occupational History  . Occupation: retired from [employer]  Social Needs  . Financial resource strain: Not on file  . Food insecurity:    Worry: Not on file    Inability: Not on file  . Transportation needs:    Medical: Not on file    Non-medical: Not on file  Tobacco Use  . Smoking status: Former Smoker    Packs/day: 0.00    Years: 30.00    Pack years: 0.00    Types: Cigarettes    Last attempt to quit: 04/21/1975    Years since quitting: 42.5  . Smokeless tobacco: Never Used  Substance and Sexual Activity  . Alcohol use: Not Currently    Comment: occasionally  . Drug use: No  . Sexual activity: Not Currently  Lifestyle  . Physical activity:    Days per week: Not on file    Minutes per session: Not on file  . Stress: Not on file  Relationships  . Social connections:    Talks on phone: Not on file    Gets together: Not on file    Attends religious service: Not on file    Active member of club or organization: Not on file    Attends meetings of clubs or organizations: Not on file    Relationship status:  Not on file  . Intimate partner violence:    Fear of current or ex partner: Not on file    Emotionally abused: Not on file    Physically abused: Not on file    Forced sexual activity: Not on file  Other Topics Concern  . Not on file  Social History Narrative   Lives alone.  Has a dog.  No children.  Retired from [employer].  Education: some college.     Physical Exam      Future Appointments  Date Time Provider [place]  11/10/2017  8:30 AM [facility] ECHO 3 [facility]  11/15/2017  8:00 AM [facility]LINE COUMADIN CLINIC [facility]  12/09/2017  9:00 AM Larey Dresser, MD MC-[facility] None  12/21/2017 10:40 AM Troy Sine, MD [facility]NORTHLIN [facility]  02/01/2018  9:30 AM Narda Amber K, DO LBN-LB[facility] None    BP 112/62   Pulse 64   Resp 15   Wt 140 lb (63.5 kg)   SpO2 98%   BMI 24.03 kg/m   Weight yesterday-?? Last visit weight-136  Pt reports she is doing well now, she wasn't feeling good this morning but feels better since her brother came by and helped her clean.  No missed doses of her meds this week, meds verified and pill box refilled.  Ordered potassium and warfarin.  She has echo sch for this week.  She has been urinating a lot, she has been emptying her foley bag 3-4x a day. Sh reports a weight of 121 this morning however that is a large drop from last week, she reports poor appetite, I got her to step on scales again and her low batt light was on, and she weighed 140. I told her to try replacing batteries to see if she gets more consistent numbers, she may be unsteady on scales also.  Will see pt next week.   Marylouise Stacks, [place] [facility] Paramedic  11/07/17

## 2017-11-10 ENCOUNTER — Other Ambulatory Visit (HOSPITAL_COMMUNITY): Payer: Medicare HMO

## 2017-11-10 ENCOUNTER — Other Ambulatory Visit (HOSPITAL_COMMUNITY): Payer: Self-pay | Admitting: Cardiology

## 2017-11-14 ENCOUNTER — Other Ambulatory Visit: Payer: Self-pay | Admitting: Cardiology

## 2017-11-14 ENCOUNTER — Other Ambulatory Visit (HOSPITAL_COMMUNITY): Payer: Self-pay

## 2017-11-14 NOTE — Telephone Encounter (Signed)
Dr. Claiborne Billings is the primary cardiologist. Please address

## 2017-11-14 NOTE — Progress Notes (Signed)
Paramedicine Encounter    Patient ID: Natasha Fletcher, female    DOB: [birth year], 84 y.o.   MRN: [id number]   Patient Care Team: Lorene Dy, MD as PCP - General (Internal Medicine) Troy Sine, MD as PCP - Cardiology (Cardiology) Constance Haw, MD as Consulting Physician (Cardiology)  Patient Active Problem List   Diagnosis Date Noted  . Cognitive decline   . Palliative care by specialist   . DNR (do not resuscitate) discussion   . Acute on chronic congestive heart failure ([place])   . Acute on chronic diastolic CHF (congestive heart failure) ([place])   . Sick sinus syndrome due to SA node dysfunction (HCC)   . Shortness of breath 06/13/2017  . Atrial fibrillation ([place]) 03/22/2016  . Bradycardia 08/22/2015  . Junctional (nodal) bradycardia 08/22/2015  . Malnutrition of moderate degree 03/19/2015  . Benign essential HTN   . Bradycardia, drug induced 03/18/2015  . Hypokalemia 02/16/2015  . Thrombocytopenia ([place]) 02/16/2015  . Sepsis due to urinary tract infection ([place])   . Sepsis ([place]) 02/12/2015  . Urinary tract infectious disease   . Long-term (current) use of anticoagulants 07/12/2014  . Chronic anticoagulation 02/11/2014  . Hypertensive cardiovascular disease 01/24/2014  . Chronic diastolic CHF (congestive heart failure) ([place]) 01/24/2014  . Anemia 01/24/2014  . Breast cancer of upper-inner quadrant of right female breast ([place]) 01/24/2014  . Acute diastolic heart failure ([place]) 11/11/2013  . Atrial flutter ([place]) 11/11/2013  . PAF (paroxysmal atrial fibrillation) ([place]) 11/10/2013  . Slurred speech 11/10/2013  . Protein-calorie malnutrition, severe ([place]) 11/06/2013  . Coffee ground emesis 11/05/2013  . Dehydration 10/19/2013  . Urinary retention 10/18/2013  . GERD (gastroesophageal reflux disease) 03/20/2013  . S/P total knee arthroplasty   . UTI (urinary tract infection) 02/21/2013  . OA (osteoarthritis) of knee 02/19/2013  . Hypertension   . Thyroid disease    . Cancer of central portion of female breast ([place]) 04/14/2011    Current Outpatient Medications:  .  amiodarone (PACERONE) 200 MG tablet, Take 0.5 tablets (100 mg total) by mouth daily., Disp: 45 tablet, Rfl: 2 .  carvedilol (COREG) 3.125 MG tablet, TAKE 1 TABLET (3.125 MG TOTAL) BY MOUTH 2 (TWO) TIMES DAILY WITH A MEAL., Disp: 60 tablet, Rfl: 1 .  donepezil (ARICEPT) 5 MG tablet, Take 1 tablet (5 mg total) by mouth at bedtime., Disp: 30 tablet, Rfl: 0 .  ferrous sulfate 325 (65 FE) MG tablet, Take 1 tablet (325 mg total) by mouth every other day. (Patient taking differently: Take 325 mg by mouth every other day. ), Disp: 30 tablet, Rfl: 3 .  furosemide (LASIX) 80 MG tablet, TAKE 1 TABLET BY MOUTH TWICE A DAY FOR EDEMA/HIGH BP (Patient taking differently: TAKE 1 TABLET BY MOUTH TWICE A DAY FOR EDEMA/HIGH BP), Disp: 60 tablet, Rfl: 1 .  latanoprost (XALATAN) 0.005 % ophthalmic solution, Place 1 drop into both eyes at bedtime., Disp: , Rfl:  .  macitentan (OPSUMIT) 10 MG tablet, Take 1 tablet (10 mg total) by mouth daily., Disp: 30 tablet, Rfl: 11 .  Melatonin 3 MG TABS, Take 9 mg by mouth., Disp: , Rfl:  .  oxybutynin (DITROPAN) 5 MG tablet, Take 5 mg by mouth 3 (three) times daily., Disp: , Rfl:  .  polyethylene glycol (MIRALAX / GLYCOLAX) packet, Take 17 g by mouth daily., Disp: 14 each, Rfl: 0 .  potassium chloride SA (K-DUR,KLOR-CON) 20 MEQ tablet, Take 1 tablet (20 mEq total) by mouth daily., Disp: 90 tablet,  Rfl: 3 .  sertraline (ZOLOFT) 25 MG tablet, Take 1 tablet (25 mg total) by mouth daily., Disp: 30 tablet, Rfl: 0 .  tadalafil, PAH, (ADCIRCA) 20 MG tablet, Take 2 tablets (40 mg total) by mouth daily., Disp: 60 tablet, Rfl: 11 .  traZODone (DESYREL) 100 MG tablet, Take 1 tablet (100 mg total) by mouth at bedtime., Disp: 30 tablet, Rfl: 3 .  warfarin (COUMADIN) 4 MG tablet, TAKE 1/2 TO 1 TABLET BY MOUTH DAILY AS DIRECTED BY COUMADIN CLINIC, Disp: 30 tablet, Rfl: 2 .  albuterol (PROVENTIL  HFA;VENTOLIN HFA) 108 (90 Base) MCG/ACT inhaler, Inhale 2 puffs into the lungs every 6 (six) hours as needed for wheezing or shortness of breath. (Patient not taking: Reported on 10/24/2017), Disp: 1 Inhaler, Rfl: 2 .  HYDROcodone-acetaminophen (NORCO) 7.5-325 MG tablet, Take 1 tablet by mouth every 6 (six) hours as needed for moderate pain. (Patient not taking: Reported on 11/14/2017), Disp: 30 tablet, Rfl: 0 .  LORazepam (ATIVAN) 1 MG tablet, Take 1 mg by mouth at bedtime as needed., Disp: , Rfl: 2 Allergies  Allergen Reactions  . Lactose Intolerance (Gi) Other (See Comments)    Stomach pain  . Milk-Related Compounds Other (See Comments)    Upset stomach   . Sulfa Antibiotics Itching      Social History   Socioeconomic History  . Marital status: Single    Spouse name: Not on file  . Number of children: 0  . Years of education: 47  . Highest education level: Some college, no degree  Occupational History  . Occupation: retired from [employer]  Social Needs  . Financial resource strain: Not on file  . Food insecurity:    Worry: Not on file    Inability: Not on file  . Transportation needs:    Medical: Not on file    Non-medical: Not on file  Tobacco Use  . Smoking status: Former Smoker    Packs/day: 0.00    Years: 30.00    Pack years: 0.00    Types: Cigarettes    Last attempt to quit: 04/21/1975    Years since quitting: 42.5  . Smokeless tobacco: Never Used  Substance and Sexual Activity  . Alcohol use: Not Currently    Comment: occasionally  . Drug use: No  . Sexual activity: Not Currently  Lifestyle  . Physical activity:    Days per week: Not on file    Minutes per session: Not on file  . Stress: Not on file  Relationships  . Social connections:    Talks on phone: Not on file    Gets together: Not on file    Attends religious service: Not on file    Active member of club or organization: Not on file    Attends meetings of clubs or organizations:  Not on file    Relationship status: Not on file  . Intimate partner violence:    Fear of current or ex partner: Not on file    Emotionally abused: Not on file    Physically abused: Not on file    Forced sexual activity: Not on file  Other Topics Concern  . Not on file  Social History Narrative   Lives alone.  Has a dog.  No children.  Retired from Equities trader.  Education: some college.     Physical Exam      Future Appointments  Date Time Provider [place]  11/15/2017  8:00 AM CVD-NLINE  COUMADIN CLINIC [facility]  11/17/2017  9:30 AM [facility] ECHO 1 [id number]  12/09/2017  9:00 AM Larey Dresser, MD MC-HVSC None  12/21/2017 10:40 AM Troy Sine, MD [facility]NORTHLIN [facility]  02/01/2018  9:30 AM Posey Pronto, Arvin Collard K, DO LBN-LBNG None    BP (!) 90/40   Pulse 84   Resp 15   Wt 137 lb (62.1 kg)   SpO2 99%   BMI 23.52 kg/m   Weight yesterday-137  Last visit [id number] B/P-92/systolic   Pt reports her breathing is not doing well this morning, she feels like doing minimal house chores get her tired and sob.  She is in process of working with Education officer, [employer] about her [employer] benefits and I advised her to ask about getting a home aide.  No missed doses of her meds this week.  meds verified and pill box refilled.  Ordered amio, lasix and tadalafil.  B/p as noted, she denies any dizziness. She just took her meds about 49min ago.  Orthostatics checked-no dizziness, no orthostatic changes   Marylouise Stacks, [place] Paramedic  11/14/17

## 2017-11-15 ENCOUNTER — Ambulatory Visit: Payer: Medicare HMO | Admitting: Pharmacist

## 2017-11-15 DIAGNOSIS — Z7901 Long term (current) use of anticoagulants: Secondary | ICD-10-CM | POA: Diagnosis not present

## 2017-11-15 DIAGNOSIS — I480 Paroxysmal atrial fibrillation: Secondary | ICD-10-CM

## 2017-11-15 LAB — POCT INR: INR: 1.8 — AB (ref 2.0–3.0)

## 2017-11-16 ENCOUNTER — Other Ambulatory Visit (HOSPITAL_COMMUNITY): Payer: Self-pay

## 2017-11-16 ENCOUNTER — Other Ambulatory Visit (HOSPITAL_COMMUNITY): Payer: Self-pay | Admitting: Cardiology

## 2017-11-16 NOTE — Progress Notes (Signed)
Came today for med rec, per coumadin clinic she is to take a whole tab of 4mg  on mon/wed/fri and 2mg  on other days and will be seen in 3wks for next week. That change was made in pill box.   Marylouise Stacks, EMT-Paramedic  11/16/17

## 2017-11-17 ENCOUNTER — Ambulatory Visit (HOSPITAL_COMMUNITY): Payer: Medicare HMO | Attending: Cardiology

## 2017-11-17 ENCOUNTER — Other Ambulatory Visit: Payer: Self-pay

## 2017-11-17 DIAGNOSIS — I361 Nonrheumatic tricuspid (valve) insufficiency: Secondary | ICD-10-CM | POA: Insufficient documentation

## 2017-11-17 DIAGNOSIS — I495 Sick sinus syndrome: Secondary | ICD-10-CM | POA: Insufficient documentation

## 2017-11-17 DIAGNOSIS — I110 Hypertensive heart disease with heart failure: Secondary | ICD-10-CM | POA: Diagnosis not present

## 2017-11-17 DIAGNOSIS — I480 Paroxysmal atrial fibrillation: Secondary | ICD-10-CM | POA: Insufficient documentation

## 2017-11-17 DIAGNOSIS — C50919 Malignant neoplasm of unspecified site of unspecified female breast: Secondary | ICD-10-CM | POA: Insufficient documentation

## 2017-11-17 DIAGNOSIS — I272 Pulmonary hypertension, unspecified: Secondary | ICD-10-CM | POA: Insufficient documentation

## 2017-11-17 DIAGNOSIS — E119 Type 2 diabetes mellitus without complications: Secondary | ICD-10-CM | POA: Diagnosis not present

## 2017-11-17 DIAGNOSIS — I5032 Chronic diastolic (congestive) heart failure: Secondary | ICD-10-CM | POA: Diagnosis not present

## 2017-11-17 DIAGNOSIS — Z950 Presence of cardiac pacemaker: Secondary | ICD-10-CM | POA: Diagnosis not present

## 2017-11-21 ENCOUNTER — Other Ambulatory Visit (HOSPITAL_COMMUNITY): Payer: Self-pay

## 2017-11-21 NOTE — Progress Notes (Signed)
Paramedicine Encounter    Patient ID: Natasha Fletcher, female    DOB: [birth year], 84 y.o.   MRN: [id number]   Patient Care Team: Lorene Dy, MD as PCP - General (Internal Medicine) Troy Sine, MD as PCP - Cardiology (Cardiology) Constance Haw, MD as Consulting Physician (Cardiology)  Patient Active Problem List   Diagnosis Date Noted  . Cognitive decline   . Palliative care by specialist   . DNR (do not resuscitate) discussion   . Acute on chronic congestive heart failure ([place])   . Acute on chronic diastolic CHF (congestive heart failure) ([place])   . Sick sinus syndrome due to SA node dysfunction (HCC)   . Shortness of breath 06/13/2017  . Atrial fibrillation ([place]) 03/22/2016  . Bradycardia 08/22/2015  . Junctional (nodal) bradycardia 08/22/2015  . Malnutrition of moderate degree 03/19/2015  . Benign essential HTN   . Bradycardia, drug induced 03/18/2015  . Hypokalemia 02/16/2015  . Thrombocytopenia ([place]) 02/16/2015  . Sepsis due to urinary tract infection ([place])   . Sepsis ([place]) 02/12/2015  . Urinary tract infectious disease   . Long-term (current) use of anticoagulants 07/12/2014  . Chronic anticoagulation 02/11/2014  . Hypertensive cardiovascular disease 01/24/2014  . Chronic diastolic CHF (congestive heart failure) ([place]) 01/24/2014  . Anemia 01/24/2014  . Breast cancer of upper-inner quadrant of right female breast ([place]) 01/24/2014  . Acute diastolic heart failure ([place]) 11/11/2013  . Atrial flutter ([place]) 11/11/2013  . PAF (paroxysmal atrial fibrillation) ([place]) 11/10/2013  . Slurred speech 11/10/2013  . Protein-calorie malnutrition, severe ([place]) 11/06/2013  . Coffee ground emesis 11/05/2013  . Dehydration 10/19/2013  . Urinary retention 10/18/2013  . GERD (gastroesophageal reflux disease) 03/20/2013  . S/P total knee arthroplasty   . UTI (urinary tract infection) 02/21/2013  . OA (osteoarthritis) of knee 02/19/2013  . Hypertension   . Thyroid disease    . Cancer of central portion of female breast ([place]) 04/14/2011    Current Outpatient Medications:  .  albuterol (PROVENTIL HFA;VENTOLIN HFA) 108 (90 Base) MCG/ACT inhaler, Inhale 2 puffs into the lungs every 6 (six) hours as needed for wheezing or shortness of breath., Disp: 1 Inhaler, Rfl: 2 .  amiodarone (PACERONE) 200 MG tablet, Take 0.5 tablets (100 mg total) by mouth daily., Disp: 45 tablet, Rfl: 2 .  carvedilol (COREG) 3.125 MG tablet, TAKE 1 TABLET (3.125 MG TOTAL) BY MOUTH 2 (TWO) TIMES DAILY WITH A MEAL., Disp: 60 tablet, Rfl: 3 .  donepezil (ARICEPT) 5 MG tablet, Take 1 tablet (5 mg total) by mouth at bedtime., Disp: 30 tablet, Rfl: 0 .  ENSURE (ENSURE), Take 237 mLs by mouth 2 (two) times daily. , Disp: , Rfl:  .  ferrous sulfate 325 (65 FE) MG tablet, Take 1 tablet (325 mg total) by mouth every other day. (Patient taking differently: Take 325 mg by mouth every other day. ), Disp: 30 tablet, Rfl: 3 .  furosemide (LASIX) 80 MG tablet, TAKE 1 TABLET BY MOUTH TWICE A DAY FOR EDEMA/HIGH BP (Patient taking differently: TAKE 1 TABLET BY MOUTH TWICE A DAY FOR EDEMA/HIGH BP), Disp: 60 tablet, Rfl: 1 .  latanoprost (XALATAN) 0.005 % ophthalmic solution, Place 1 drop into both eyes at bedtime., Disp: , Rfl:  .  macitentan (OPSUMIT) 10 MG tablet, Take 1 tablet (10 mg total) by mouth daily., Disp: 30 tablet, Rfl: 11 .  Melatonin 3 MG TABS, Take 9 mg by mouth., Disp: , Rfl:  .  oxybutynin (DITROPAN) 5 MG tablet, Take  5 mg by mouth 3 (three) times daily., Disp: , Rfl:  .  polyethylene glycol (MIRALAX / GLYCOLAX) packet, Take 17 g by mouth daily., Disp: 14 each, Rfl: 0 .  potassium chloride SA (K-DUR,KLOR-CON) 20 MEQ tablet, Take 1 tablet (20 mEq total) by mouth daily., Disp: 90 tablet, Rfl: 3 .  sertraline (ZOLOFT) 25 MG tablet, Take 1 tablet (25 mg total) by mouth daily., Disp: 30 tablet, Rfl: 0 .  tadalafil, PAH, (ADCIRCA) 20 MG tablet, Take 2 tablets (40 mg total) by mouth daily., Disp: 60 tablet,  Rfl: 11 .  traZODone (DESYREL) 100 MG tablet, Take 1 tablet (100 mg total) by mouth at bedtime., Disp: 30 tablet, Rfl: 3 .  warfarin (COUMADIN) 4 MG tablet, TAKE 1/2 TO 1 TABLET BY MOUTH DAILY AS DIRECTED BY COUMADIN CLINIC, Disp: 30 tablet, Rfl: 2 .  amiodarone (PACERONE) 200 MG tablet, TAKE 1 TABLET (200 MG TOTAL) BY MOUTH DAILY. (Patient not taking: Reported on 11/21/2017), Disp: 90 tablet, Rfl: 3 .  HYDROcodone-acetaminophen (NORCO) 7.5-325 MG tablet, Take 1 tablet by mouth every 6 (six) hours as needed for moderate pain. (Patient not taking: Reported on 11/14/2017), Disp: 30 tablet, Rfl: 0 .  LORazepam (ATIVAN) 1 MG tablet, Take 1 mg by mouth at bedtime as needed., Disp: , Rfl: 2 Allergies  Allergen Reactions  . Lactose Intolerance (Gi) Other (See Comments)    Stomach pain  . Milk-Related Compounds Other (See Comments)    Upset stomach   . Sulfa Antibiotics Itching      Social History   Socioeconomic History  . Marital status: Single    Spouse name: Not on file  . Number of children: 0  . Years of education: 59  . Highest education level: Some college, no degree  Occupational History  . Occupation: retired from [employer]  Social Needs  . Financial resource strain: Not on file  . Food insecurity:    Worry: Not on file    Inability: Not on file  . Transportation needs:    Medical: Not on file    Non-medical: Not on file  Tobacco Use  . Smoking status: Former Smoker    Packs/day: 0.00    Years: 30.00    Pack years: 0.00    Types: Cigarettes    Last attempt to quit: 04/21/1975    Years since quitting: 42.6  . Smokeless tobacco: Never Used  Substance and Sexual Activity  . Alcohol use: Not Currently    Comment: occasionally  . Drug use: No  . Sexual activity: Not Currently  Lifestyle  . Physical activity:    Days per week: Not on file    Minutes per session: Not on file  . Stress: Not on file  Relationships  . Social connections:    Talks on phone:  Not on file    Gets together: Not on file    Attends religious service: Not on file    Active member of club or organization: Not on file    Attends meetings of clubs or organizations: Not on file    Relationship status: Not on file  . Intimate partner violence:    Fear of current or ex partner: Not on file    Emotionally abused: Not on file    Physically abused: Not on file    Forced sexual activity: Not on file  Other Topics Concern  . Not on file  Social History Narrative   Lives alone.  Has a dog.  No children.  Retired from Equities trader.  Education: some college.     Physical Exam  Constitutional: She is oriented to person, place, and time. No distress.  HENT:  Head: Normocephalic.  Neck: Normal range of motion. Neck supple. No JVD present.  Cardiovascular: Normal rate, regular rhythm and normal heart sounds.  Pulmonary/Chest: Effort normal and breath sounds normal. No stridor. No respiratory distress. She has no wheezes. She has no rales. She exhibits no tenderness.  Abdominal: Soft. She exhibits no distension.  Musculoskeletal: She exhibits no edema.  Neurological: She is alert and oriented to person, place, and time.  Skin: Skin is warm and dry. She is not diaphoretic. No pallor.  Psychiatric: She has a normal mood and affect. Her behavior is normal. Judgment and thought content normal.        Future Appointments  Date Time Provider [place]  12/06/2017  8:30 AM CVD-NLINE COUMADIN CLINIC [facility]  12/09/2017  9:00 AM Larey Dresser, MD MC-HVSC None  12/19/2017 11:30 AM Constance Haw, MD [facility]  12/21/2017 10:40 AM Troy Sine, MD [facility]-NORTHLIN [facility]  02/01/2018  9:30 AM Posey Pronto, Arvin Collard K, DO LBN-LBNG None    BP (!) 100/48   Pulse 78   Resp 18   Wt 138 lb (62.6 kg)   SpO2 99%   BMI 23.69 kg/m   Weight yesterday-140  Last visit weight-137   Pt reports she is feeling fair today, she was finishing  breakfast when I arrived.  Upon starting her pill box refill there was 2 different dosages noted in epic for her amio. I spoke with angela, CMA at heart clinic and she advised to continue the 1/2 tab of 100mg  daily for now, it is unclear per epic why it was changed, she goes to see dr Aundra Dubin again in a couple weeks and then sees camnitz shortly after that and will address it then. Pharmacy may not have gotten the new rx of the dose change and possibly just continued the dose of whole tab daily rather than the 1/2. So we will continue the 1/2 tab daily.  meds verified and pill box refilled. No missed doses this week.  Pt has minimal swelling to her feet, nothing abnormal.   Marylouise Stacks, [place] Paramedic  11/21/17

## 2017-11-28 ENCOUNTER — Other Ambulatory Visit (HOSPITAL_COMMUNITY): Payer: Self-pay

## 2017-11-28 NOTE — Progress Notes (Signed)
Paramedicine Encounter    Patient ID: Natasha Fletcher, female    DOB: Mar 01, 1926, 84 y.o.   MRN: [id number]   Patient Care Team: Lorene Dy, MD as PCP - General (Internal Medicine) Troy Sine, MD as PCP - Cardiology (Cardiology) Constance Haw, MD as Consulting Physician (Cardiology)  Patient Active Problem List   Diagnosis Date Noted  . Cognitive decline   . Palliative care by specialist   . DNR (do not resuscitate) discussion   . Acute on chronic congestive heart failure ([place])   . Acute on chronic diastolic CHF (congestive heart failure) ([place])   . Sick sinus syndrome due to SA node dysfunction (HCC)   . Shortness of breath 06/13/2017  . Atrial fibrillation ([place]) 03/22/2016  . Bradycardia 08/22/2015  . Junctional (nodal) bradycardia 08/22/2015  . Malnutrition of moderate degree 03/19/2015  . Benign essential HTN   . Bradycardia, drug induced 03/18/2015  . Hypokalemia 02/16/2015  . Thrombocytopenia ([place]) 02/16/2015  . Sepsis due to urinary tract infection ([place])   . Sepsis ([place]) 02/12/2015  . Urinary tract infectious disease   . Long-term (current) use of anticoagulants 07/12/2014  . Chronic anticoagulation 02/11/2014  . Hypertensive cardiovascular disease 01/24/2014  . Chronic diastolic CHF (congestive heart failure) ([place]) 01/24/2014  . Anemia 01/24/2014  . Breast cancer of upper-inner quadrant of right female breast ([place]) 01/24/2014  . Acute diastolic heart failure ([place]) 11/11/2013  . Atrial flutter ([place]) 11/11/2013  . PAF (paroxysmal atrial fibrillation) ([place]) 11/10/2013  . Slurred speech 11/10/2013  . Protein-calorie malnutrition, severe ([place]) 11/06/2013  . Coffee ground emesis 11/05/2013  . Dehydration 10/19/2013  . Urinary retention 10/18/2013  . GERD (gastroesophageal reflux disease) 03/20/2013  . S/P total knee arthroplasty   . UTI (urinary tract infection) 02/21/2013  . OA (osteoarthritis) of knee 02/19/2013  . Hypertension   . Thyroid disease    . Cancer of central portion of female breast ([place]) 04/14/2011    Current Outpatient Medications:  .  albuterol (PROVENTIL HFA;VENTOLIN HFA) 108 (90 Base) MCG/ACT inhaler, Inhale 2 puffs into the lungs every 6 (six) hours as needed for wheezing or shortness of breath., Disp: 1 Inhaler, Rfl: 2 .  amiodarone (PACERONE) 200 MG tablet, Take 0.5 tablets (100 mg total) by mouth daily., Disp: 45 tablet, Rfl: 2 .  carvedilol (COREG) 3.125 MG tablet, TAKE 1 TABLET (3.125 MG TOTAL) BY MOUTH 2 (TWO) TIMES DAILY WITH A MEAL., Disp: 60 tablet, Rfl: 3 .  donepezil (ARICEPT) 5 MG tablet, Take 1 tablet (5 mg total) by mouth at bedtime., Disp: 30 tablet, Rfl: 0 .  ENSURE (ENSURE), Take 237 mLs by mouth 2 (two) times daily. , Disp: , Rfl:  .  ferrous sulfate 325 (65 FE) MG tablet, Take 1 tablet (325 mg total) by mouth every other day. (Patient taking differently: Take 325 mg by mouth every other day. ), Disp: 30 tablet, Rfl: 3 .  furosemide (LASIX) 80 MG tablet, TAKE 1 TABLET BY MOUTH TWICE A DAY FOR EDEMA/HIGH BP (Patient taking differently: TAKE 1 TABLET BY MOUTH TWICE A DAY FOR EDEMA/HIGH BP), Disp: 60 tablet, Rfl: 1 .  latanoprost (XALATAN) 0.005 % ophthalmic solution, Place 1 drop into both eyes at bedtime., Disp: , Rfl:  .  macitentan (OPSUMIT) 10 MG tablet, Take 1 tablet (10 mg total) by mouth daily., Disp: 30 tablet, Rfl: 11 .  Melatonin 3 MG TABS, Take 9 mg by mouth., Disp: , Rfl:  .  oxybutynin (DITROPAN) 5 MG tablet, Take  5 mg by mouth 3 (three) times daily., Disp: , Rfl:  .  polyethylene glycol (MIRALAX / GLYCOLAX) packet, Take 17 g by mouth daily., Disp: 14 each, Rfl: 0 .  potassium chloride SA (K-DUR,KLOR-CON) 20 MEQ tablet, Take 1 tablet (20 mEq total) by mouth daily., Disp: 90 tablet, Rfl: 3 .  sertraline (ZOLOFT) 25 MG tablet, Take 1 tablet (25 mg total) by mouth daily., Disp: 30 tablet, Rfl: 0 .  tadalafil, PAH, (ADCIRCA) 20 MG tablet, Take 2 tablets (40 mg total) by mouth daily., Disp: 60 tablet,  Rfl: 11 .  traZODone (DESYREL) 100 MG tablet, Take 1 tablet (100 mg total) by mouth at bedtime., Disp: 30 tablet, Rfl: 3 .  warfarin (COUMADIN) 4 MG tablet, TAKE 1/2 TO 1 TABLET BY MOUTH DAILY AS DIRECTED BY COUMADIN CLINIC, Disp: 30 tablet, Rfl: 2 .  amiodarone (PACERONE) 200 MG tablet, TAKE 1 TABLET (200 MG TOTAL) BY MOUTH DAILY. (Patient not taking: Reported on 11/21/2017), Disp: 90 tablet, Rfl: 3 .  HYDROcodone-acetaminophen (NORCO) 7.5-325 MG tablet, Take 1 tablet by mouth every 6 (six) hours as needed for moderate pain. (Patient not taking: Reported on 11/14/2017), Disp: 30 tablet, Rfl: 0 .  LORazepam (ATIVAN) 1 MG tablet, Take 1 mg by mouth at bedtime as needed., Disp: , Rfl: 2 Allergies  Allergen Reactions  . Lactose Intolerance (Gi) Other (See Comments)    Stomach pain  . Milk-Related Compounds Other (See Comments)    Upset stomach   . Sulfa Antibiotics Itching      Social History   Socioeconomic History  . Marital status: Single    Spouse name: Not on file  . Number of children: 0  . Years of education: 21  . Highest education level: Some college, no degree  Occupational History  . Occupation: retired from [employer]  Social Needs  . Financial resource strain: Not on file  . Food insecurity:    Worry: Not on file    Inability: Not on file  . Transportation needs:    Medical: Not on file    Non-medical: Not on file  Tobacco Use  . Smoking status: Former Smoker    Packs/day: 0.00    Years: 30.00    Pack years: 0.00    Types: Cigarettes    Last attempt to quit: 04/21/1975    Years since quitting: 42.6  . Smokeless tobacco: Never Used  Substance and Sexual Activity  . Alcohol use: Not Currently    Comment: occasionally  . Drug use: No  . Sexual activity: Not Currently  Lifestyle  . Physical activity:    Days per week: Not on file    Minutes per session: Not on file  . Stress: Not on file  Relationships  . Social connections:    Talks on phone:  Not on file    Gets together: Not on file    Attends religious service: Not on file    Active member of club or organization: Not on file    Attends meetings of clubs or organizations: Not on file    Relationship status: Not on file  . Intimate partner violence:    Fear of current or ex partner: Not on file    Emotionally abused: Not on file    Physically abused: Not on file    Forced sexual activity: Not on file  Other Topics Concern  . Not on file  Social History Narrative   Lives alone.  Has a dog.  No children.  Retired from Equities trader.  Education: some college.     Physical Exam      Future Appointments  Date Time Provider [place]  12/06/2017  8:30 AM CVD[facility]NLINE COUMADIN CLINIC [facility]  12/09/2017  9:00 AM Larey Dresser, MD MC-[facility] None  12/19/2017 11:30 AM Constance Haw, MD [facility]  12/21/2017 10:40 AM Troy Sine, MD CVD-NORTHLIN [facility]  02/01/2018  9:30 AM Posey Pronto, Arvin Collard K, DO LBN-LB[facility] None    BP 110/60   Pulse 76   Resp 16   SpO2 99%   Weight yesterday-134 Last visit weight-138   Pt was sob this morning when I arrived, she reports doing a lot in the apt this morning prior to my arrival. She gets sob upon mild exertion. After resting in chair for a few min the sob resolved.  Wearing her 02 continuously.  meds verified and pill box refilled. Pt did not weigh this morning.  When checking her v/s her HR was very irregular ranging from 50 to 80. By the time I got the monitor to do 12 lead she was back in rhythm at rate of 84. Pt denies any palpitations, no c/p, no dizziness.  She did take her meds this morning, no missed doses of her meds last week. She has not eaten breakfast yet today.    Marylouise Stacks, [place] [facility] Paramedic  11/28/17

## 2017-12-01 DIAGNOSIS — R5381 Other malaise: Secondary | ICD-10-CM | POA: Diagnosis not present

## 2017-12-01 DIAGNOSIS — I5022 Chronic systolic (congestive) heart failure: Secondary | ICD-10-CM | POA: Diagnosis not present

## 2017-12-01 DIAGNOSIS — S83509A Sprain of unspecified cruciate ligament of unspecified knee, initial encounter: Secondary | ICD-10-CM | POA: Diagnosis not present

## 2017-12-01 DIAGNOSIS — M2550 Pain in unspecified joint: Secondary | ICD-10-CM | POA: Diagnosis not present

## 2017-12-01 DIAGNOSIS — R0602 Shortness of breath: Secondary | ICD-10-CM | POA: Diagnosis not present

## 2017-12-01 DIAGNOSIS — A419 Sepsis, unspecified organism: Secondary | ICD-10-CM | POA: Diagnosis not present

## 2017-12-01 DIAGNOSIS — Z9181 History of falling: Secondary | ICD-10-CM | POA: Diagnosis not present

## 2017-12-01 DIAGNOSIS — I5032 Chronic diastolic (congestive) heart failure: Secondary | ICD-10-CM | POA: Diagnosis not present

## 2017-12-01 DIAGNOSIS — Z5189 Encounter for other specified aftercare: Secondary | ICD-10-CM | POA: Diagnosis not present

## 2017-12-01 DIAGNOSIS — R269 Unspecified abnormalities of gait and mobility: Secondary | ICD-10-CM | POA: Diagnosis not present

## 2017-12-05 ENCOUNTER — Other Ambulatory Visit (HOSPITAL_COMMUNITY): Payer: Self-pay

## 2017-12-05 NOTE — Progress Notes (Signed)
Paramedicine Encounter    Patient ID: Natasha Fletcher, female    DOB: 1926-02-10, 84 y.o.   MRN: [id number]   Patient Care Team: Lorene Dy, MD as PCP - General (Internal Medicine) Troy Sine, MD as PCP - Cardiology (Cardiology) Constance Haw, MD as Consulting Physician (Cardiology)  Patient Active Problem List   Diagnosis Date Noted  . Cognitive decline   . Palliative care by specialist   . DNR (do not resuscitate) discussion   . Acute on chronic congestive heart failure ([place])   . Acute on chronic diastolic CHF (congestive heart failure) ([place])   . Sick sinus syndrome due to SA node dysfunction (H[facility])   . Shortness of breath 06/13/2017  . Atrial fibrillation ([place]) 03/22/2016  . Bradycardia 08/22/2015  . Junctional (nodal) bradycardia 08/22/2015  . Malnutrition of moderate degree 03/19/2015  . Benign essential HTN   . Bradycardia, drug induced 03/18/2015  . Hypokalemia 02/16/2015  . Thrombocytopenia ([place]) 02/16/2015  . Sepsis due to urinary tract infection ([place])   . Sepsis ([place]) 02/12/2015  . Urinary tract infectious disease   . Long-term (current) use of anticoagulants 07/12/2014  . Chronic anticoagulation 02/11/2014  . Hypertensive cardiovascular disease 01/24/2014  . Chronic diastolic CHF (congestive heart failure) ([place]) 01/24/2014  . Anemia 01/24/2014  . Breast cancer of upper-inner quadrant of right female breast ([place]) 01/24/2014  . Acute diastolic heart failure ([place]) 11/11/2013  . Atrial flutter ([place]) 11/11/2013  . PAF (paroxysmal atrial fibrillation) ([place]) 11/10/2013  . Slurred speech 11/10/2013  . Protein-calorie malnutrition, severe ([place]) 11/06/2013  . Coffee ground emesis 11/05/2013  . Dehydration 10/19/2013  . Urinary retention 10/18/2013  . GERD (gastroesophageal reflux disease) 03/20/2013  . S/P total knee arthroplasty   . UTI (urinary tract infection) 02/21/2013  . OA (osteoarthritis) of knee 02/19/2013  . Hypertension   . Thyroid disease    . Cancer of central portion of female breast ([place]) 04/14/2011    Current Outpatient Medications:  .  albuterol (PROVENTIL HFA;VENTOLIN HFA) 108 (90 Base) MCG/ACT inhaler, Inhale 2 puffs into the lungs every 6 (six) hours as needed for wheezing or shortness of breath., Disp: 1 Inhaler, Rfl: 2 .  amiodarone (PACERONE) 200 MG tablet, Take 0.5 tablets (100 mg total) by mouth daily., Disp: 45 tablet, Rfl: 2 .  carvedilol (COREG) 3.125 MG tablet, TAKE 1 TABLET (3.125 MG TOTAL) BY MOUTH 2 (TWO) TIMES DAILY WITH A MEAL., Disp: 60 tablet, Rfl: 3 .  donepezil (ARICEPT) 5 MG tablet, Take 1 tablet (5 mg total) by mouth at bedtime., Disp: 30 tablet, Rfl: 0 .  ENSURE (ENSURE), Take 237 mLs by mouth 2 (two) times daily. , Disp: , Rfl:  .  ferrous sulfate 325 (65 FE) MG tablet, Take 1 tablet (325 mg total) by mouth every other day. (Patient taking differently: Take 325 mg by mouth every other day. ), Disp: 30 tablet, Rfl: 3 .  furosemide (LASIX) 80 MG tablet, TAKE 1 TABLET BY MOUTH TWICE A DAY FOR EDEMA/HIGH BP (Patient taking differently: TAKE 1 TABLET BY MOUTH TWICE A DAY FOR EDEMA/HIGH BP), Disp: 60 tablet, Rfl: 1 .  HYDROcodone-acetaminophen (NORCO) 7.5-325 MG tablet, Take 1 tablet by mouth every 6 (six) hours as needed for moderate pain., Disp: 30 tablet, Rfl: 0 .  latanoprost (XALATAN) 0.005 % ophthalmic solution, Place 1 drop into both eyes at bedtime., Disp: , Rfl:  .  LORazepam (ATIVAN) 1 MG tablet, Take 1 mg by mouth at bedtime as needed., Disp: ,  Rfl: 2 .  macitentan (OPSUMIT) 10 MG tablet, Take 1 tablet (10 mg total) by mouth daily., Disp: 30 tablet, Rfl: 11 .  Melatonin 3 MG TABS, Take 9 mg by mouth., Disp: , Rfl:  .  oxybutynin (DITROPAN) 5 MG tablet, Take 5 mg by mouth 3 (three) times daily., Disp: , Rfl:  .  polyethylene glycol (MIRALAX / GLYCOLAX) packet, Take 17 g by mouth daily., Disp: 14 each, Rfl: 0 .  potassium chloride SA (K-DUR,KLOR-CON) 20 MEQ tablet, Take 1 tablet (20 mEq total) by  mouth daily., Disp: 90 tablet, Rfl: 3 .  sertraline (ZOLOFT) 25 MG tablet, Take 1 tablet (25 mg total) by mouth daily., Disp: 30 tablet, Rfl: 0 .  tadalafil, PAH, (ADCIRCA) 20 MG tablet, Take 2 tablets (40 mg total) by mouth daily., Disp: 60 tablet, Rfl: 11 .  traZODone (DESYREL) 100 MG tablet, Take 1 tablet (100 mg total) by mouth at bedtime., Disp: 30 tablet, Rfl: 3 .  warfarin (COUMADIN) 4 MG tablet, TAKE 1/2 TO 1 TABLET BY MOUTH DAILY AS DIRECTED BY COUMADIN CLINIC, Disp: 30 tablet, Rfl: 2 .  amiodarone (PACERONE) 200 MG tablet, TAKE 1 TABLET (200 MG TOTAL) BY MOUTH DAILY. (Patient not taking: Reported on 11/21/2017), Disp: 90 tablet, Rfl: 3 Allergies  Allergen Reactions  . Lactose Intolerance (Gi) Other (See Comments)    Stomach pain  . Milk-Related Compounds Other (See Comments)    Upset stomach   . Sulfa Antibiotics Itching      Social History   Socioeconomic History  . Marital status: Single    Spouse name: Not on file  . Number of children: 0  . Years of education: 58  . Highest education level: Some college, no degree  Occupational History  . Occupation: retired from [employer]  Social Needs  . Financial resource strain: Not on file  . Food insecurity:    Worry: Not on file    Inability: Not on file  . Transportation needs:    Medical: Not on file    Non-medical: Not on file  Tobacco Use  . Smoking status: Former Smoker    Packs/day: 0.00    Years: 30.00    Pack years: 0.00    Types: Cigarettes    Last attempt to quit: 04/21/1975    Years since quitting: 42.6  . Smokeless tobacco: Never Used  Substance and Sexual Activity  . Alcohol use: Not Currently    Comment: occasionally  . Drug use: No  . Sexual activity: Not Currently  Lifestyle  . Physical activity:    Days per week: Not on file    Minutes per session: Not on file  . Stress: Not on file  Relationships  . Social connections:    Talks on phone: Not on file    Gets together: Not on  file    Attends religious service: Not on file    Active member of club or organization: Not on file    Attends meetings of clubs or organizations: Not on file    Relationship status: Not on file  . Intimate partner violence:    Fear of current or ex partner: Not on file    Emotionally abused: Not on file    Physically abused: Not on file    Forced sexual activity: Not on file  Other Topics Concern  . Not on file  Social History Narrative   Lives alone.  Has a dog.  No children.  Retired from the  [employer].  Education: some college.     Physical Exam      Future Appointments  Date Time Provider [place]  12/06/2017  8:30 AM [facility]  12/09/2017  9:00 AM Larey Dresser, MD [facility] None  12/19/2017 11:30 AM Constance Haw, MD [facility]  12/21/2017 10:40 AM Troy Sine, MD [facility]  02/01/2018  9:30 AM Posey Pronto, Arvin Collard K, DO [facility] None    BP 118/60   Pulse 60   Resp 16   Wt 134 lb (60.8 kg)   SpO2 99%   BMI 23.00 kg/m   Weight yesterday-134 Last visit weight-134  Pt reports today is not a good day for her, she is having episodes of sob, which is usual for her at times, and then aches and pains from arthritis as well.  meds verified and pill box refilled.  No missed doses of her meds.  Filled coumadin as per last visit, she goes tomor for next check, if anything needs to be changed then I will come out to make adjustments.  Will have to call [facility]  [phone] to order refills of opsumit as they are closed today for the holiday as well as [facility]-- to order tadalafil-[phone]  Poor appetite, which is usual for her as well. She has minimal swelling to her legs and ankles, nothing abnormal.   Marylouise Stacks, [place] Paramedic  12/06/17

## 2017-12-06 ENCOUNTER — Ambulatory Visit: Payer: Medicare HMO | Admitting: Pharmacist

## 2017-12-06 DIAGNOSIS — I480 Paroxysmal atrial fibrillation: Secondary | ICD-10-CM

## 2017-12-06 DIAGNOSIS — Z7901 Long term (current) use of anticoagulants: Secondary | ICD-10-CM

## 2017-12-06 LAB — POCT INR: INR: 3 (ref 2.0–3.0)

## 2017-12-09 ENCOUNTER — Ambulatory Visit (HOSPITAL_COMMUNITY)
Admission: RE | Admit: 2017-12-09 | Discharge: 2017-12-09 | Disposition: A | Discharge status: Home / Self Care | Payer: Medicare HMO | Source: Ambulatory Visit | Attending: Cardiology | Admitting: Cardiology

## 2017-12-09 ENCOUNTER — Other Ambulatory Visit: Payer: Self-pay

## 2017-12-09 ENCOUNTER — Encounter (HOSPITAL_COMMUNITY): Payer: Self-pay | Admitting: Cardiology

## 2017-12-09 VITALS — BP 105/55 | HR 68 | Wt 135.0 lb

## 2017-12-09 DIAGNOSIS — N183 Chronic kidney disease, stage 3 (moderate): Secondary | ICD-10-CM | POA: Insufficient documentation

## 2017-12-09 DIAGNOSIS — Z853 Personal history of malignant neoplasm of breast: Secondary | ICD-10-CM | POA: Diagnosis not present

## 2017-12-09 DIAGNOSIS — Z79899 Other long term (current) drug therapy: Secondary | ICD-10-CM | POA: Diagnosis not present

## 2017-12-09 DIAGNOSIS — F0390 Unspecified dementia without behavioral disturbance: Secondary | ICD-10-CM | POA: Diagnosis not present

## 2017-12-09 DIAGNOSIS — I2721 Secondary pulmonary arterial hypertension: Secondary | ICD-10-CM | POA: Insufficient documentation

## 2017-12-09 DIAGNOSIS — I130 Hypertensive heart and chronic kidney disease with heart failure and stage 1 through stage 4 chronic kidney disease, or unspecified chronic kidney disease: Secondary | ICD-10-CM | POA: Diagnosis not present

## 2017-12-09 DIAGNOSIS — E859 Amyloidosis, unspecified: Secondary | ICD-10-CM

## 2017-12-09 DIAGNOSIS — Z7901 Long term (current) use of anticoagulants: Secondary | ICD-10-CM | POA: Insufficient documentation

## 2017-12-09 DIAGNOSIS — E1122 Type 2 diabetes mellitus with diabetic chronic kidney disease: Secondary | ICD-10-CM | POA: Insufficient documentation

## 2017-12-09 DIAGNOSIS — I5032 Chronic diastolic (congestive) heart failure: Secondary | ICD-10-CM | POA: Diagnosis not present

## 2017-12-09 DIAGNOSIS — I272 Pulmonary hypertension, unspecified: Secondary | ICD-10-CM | POA: Diagnosis not present

## 2017-12-09 DIAGNOSIS — I495 Sick sinus syndrome: Secondary | ICD-10-CM | POA: Insufficient documentation

## 2017-12-09 DIAGNOSIS — I480 Paroxysmal atrial fibrillation: Secondary | ICD-10-CM | POA: Insufficient documentation

## 2017-12-09 DIAGNOSIS — I071 Rheumatic tricuspid insufficiency: Secondary | ICD-10-CM | POA: Insufficient documentation

## 2017-12-09 DIAGNOSIS — Z87891 Personal history of nicotine dependence: Secondary | ICD-10-CM | POA: Insufficient documentation

## 2017-12-09 LAB — COMPREHENSIVE METABOLIC PANEL
ALBUMIN: 3.8 g/dL (ref 3.5–5.0)
ALK PHOS: 81 U/L (ref 38–126)
ALT: 13 U/L (ref 0–44)
AST: 26 U/L (ref 15–41)
Anion gap: 11 (ref 5–15)
BILIRUBIN TOTAL: 0.7 mg/dL (ref 0.3–1.2)
BUN: 44 mg/dL — AB (ref 8–23)
CO2: 26 mmol/L (ref 22–32)
Calcium: 9.3 mg/dL (ref 8.9–10.3)
Chloride: 103 mmol/L (ref 98–111)
Creatinine, Ser: 1.94 mg/dL — ABNORMAL HIGH (ref 0.44–1.00)
GFR calc Af Amer: 25 mL/min — ABNORMAL LOW (ref >60)
GFR calc non Af Amer: 22 mL/min — ABNORMAL LOW (ref >60)
GLUCOSE: 93 mg/dL (ref 70–99)
POTASSIUM: 4.6 mmol/L (ref 3.5–5.1)
Sodium: 140 mmol/L (ref 135–145)
TOTAL PROTEIN: 7.5 g/dL (ref 6.5–8.1)

## 2017-12-09 MED ORDER — MEGESTROL ACETATE 625 MG/5ML PO SUSP: 625.0000 mg | Freq: Every day | ORAL | 3 refills | Status: DC

## 2017-12-09 NOTE — Patient Instructions (Signed)
Labs today (will call for abnormal results, otherwise no news is good news)  START taking megace 5 ml (625mg ) Once Daily  Follow up in 6 weeks.

## 2017-12-10 ENCOUNTER — Other Ambulatory Visit (HOSPITAL_COMMUNITY): Payer: Self-pay | Admitting: Cardiology

## 2017-12-10 NOTE — Progress Notes (Signed)
PCP: Dr. Mancel Bale Cardiology: Dr. Claiborne Billings HF Cardiology: Dr. Aundra Dubin  84 y.o. with history of CKD stage 3, SSS with Medtronic PPM, paroxysmal atrial fibrillation, and chronic diastolic CHF was referred by Dr. Claiborne Billings for evaluation of CHF/pulmonary hypertension. She has had noticeable exertional dyspnea for over a year. It has worsened gradually. Currently, she is short of breath with most ADLs.  Most recent echo in 11/18 showed normal EF with severe LVH, restrictive diastolic function, moderately decreased RV systolic function, and severe pulmonary hypertension.  RHC in 1/19 showed severe PAH and mildly elevated PCWP. V/Q scan showed no chronic PEs and PFTs showed minimal obstructive/restriction.   She was admitted in 3/19 for CHF exacerbation and treated with diuresis.  Weight is down 7 lbs today.  She is now in a SNF for rehab.   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   She returns for followup of CHF and pulmonary hypertension.  She lives at home alone.  Wearing oxygen at all times.  Weight has been trending down, poor appetite.  She fatigues easily.  She remains short of breath walking about 40-50 feet.  No lightheadedness.  No chest pain.    6 minute walk (9/19): Only able to walk 2 minutes, 24 m  Labs (9/18): K 4.3, creatinine 1.68 Labs (11/18): TSH normal, LFTs normal, NT-proBNP 4311, hgb 9.7 Labs (1/19): K 4.4, creatinine 1.58, hgb 10 Labs (3/19): K 4.1, creatinine 1.48 => 1.87, hgb 9.5, urine immunofixation negative, ANA negative, anti-Jo1 antibody negative, anti-SCL70 negative, SPEP with faint M-spike, RF negative. LFTs/TSH normal.  Labs (5/19): K 3.2, creatinine 1.68 Labs (7/19): TSH normal, K 4.2, creatinine 1.73, hgb 9.8  PMH: 1. CKD stage 3 2. Right breast cancer.  3. Atrial fibrillation: Paroxysmal.  H/o DCCV.  On amiodarone to maintain NSR.   4. Right TKR 2015 5. Sick sinus syndrome:  Medtronic PPM 5/17.  6. Type II diabetes 7. HTN 8. Chronic diastolic CHF: Echo (82/99) with EF 65-70%, severe LVH, restrictive diastolic function, mild MR, mild AI, moderately dilated RV with moderately decreased systolic function, PASP 79 mmHg, severe TR.  - TcPYP scan (2/19): Suggestive of TTR amyloidosis.  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  9. Pulmonary hypertension:  - RHC (1/19): mean RA 8, PA 72/22 mean 41, mean PCWP 20, CI 2.22, PVR 5.4 WU. - PFTs (2/19): minimal obstruction, mild restriction, severely decreased DLCO.  - V/Q scan (2/19): No chronic or acute PE. 10. Severe TR 11. Dementia  Social History   Socioeconomic History  . Marital status: Single    Spouse name: Not on file  . Number of children: 0  . Years of education: 22  . Highest education level: Some college, no degree  Occupational History  . Occupation: retired from [employer]  Social Needs  . Financial resource strain: Not on file  . Food insecurity:    Worry: Not on file    Inability: Not on file  . Transportation needs:    Medical: Not on file    Non-medical: Not on file  Tobacco Use  . Smoking status: Former Smoker    Packs/day: 0.00    Years: 30.00    Pack years: 0.00    Types: Cigarettes    Last attempt to quit: 04/21/1975    Years since quitting: 42.6  . Smokeless tobacco: Never Used  Substance and Sexual Activity  . Alcohol  use: Not Currently    Comment: occasionally  . Drug use: No  . Sexual activity: Not Currently  Lifestyle  . Physical activity:    Days per week: Not on file    Minutes per session: Not on file  . Stress: Not on file  Relationships  . Social connections:    Talks on phone: Not on file    Gets together: Not on file    Attends religious service: Not on file    Active member of club or organization: Not on file    Attends meetings of clubs or organizations: Not on file    Relationship status: Not on file  . Intimate partner  violence:    Fear of current or ex partner: Not on file    Emotionally abused: Not on file    Physically abused: Not on file    Forced sexual activity: Not on file  Other Topics Concern  . Not on file  Social History Narrative   Lives alone.  Has a dog.  No children.  Retired from [employer].  Education: some college.    Family History  Problem Relation Age of Onset  . Heart disease Mother   . Stroke Mother   . Hypertension Mother   . Stroke Father   . Hypertension Father   . Stroke Brother   . Heart attack Neg Hx   . Neuropathy Neg Hx    ROS: All systems reviewed and negative except as per HPI.   Current Outpatient Medications  Medication Sig Dispense Refill  . albuterol (PROVENTIL HFA;VENTOLIN HFA) 108 (90 Base) MCG/ACT inhaler Inhale 2 puffs into the lungs every 6 (six) hours as needed for wheezing or shortness of breath. 1 Inhaler 2  . amiodarone (PACERONE) 200 MG tablet Take 0.5 tablets (100 mg total) by mouth daily. 45 tablet 2  . carvedilol (COREG) 3.125 MG tablet TAKE 1 TABLET (3.125 MG TOTAL) BY MOUTH 2 (TWO) TIMES DAILY WITH A MEAL. 60 tablet 3  . donepezil (ARICEPT) 5 MG tablet Take 1 tablet (5 mg total) by mouth at bedtime. 30 tablet 0  . ENSURE (ENSURE) Take 237 mLs by mouth 2 (two) times daily.     . ferrous sulfate 325 (65 FE) MG tablet Take 1 tablet (325 mg total) by mouth every other day. 30 tablet 3  . furosemide (LASIX) 80 MG tablet TAKE 1 TABLET BY MOUTH TWICE A DAY FOR EDEMA/HIGH BP 60 tablet 1  . HYDROcodone-acetaminophen (NORCO) 7.5-325 MG tablet Take 1 tablet by mouth every 6 (six) hours as needed for moderate pain. 30 tablet 0  . latanoprost (XALATAN) 0.005 % ophthalmic solution Place 1 drop into both eyes at bedtime.    Marland Kitchen LORazepam (ATIVAN) 1 MG tablet Take 1 mg by mouth at bedtime as needed.  2  . macitentan (OPSUMIT) 10 MG tablet Take 1 tablet (10 mg total) by mouth daily. 30 tablet 11  . Melatonin 3 MG TABS Take 9 mg by mouth.    .  oxybutynin (DITROPAN) 5 MG tablet Take 5 mg by mouth 3 (three) times daily.    . polyethylene glycol (MIRALAX / GLYCOLAX) packet Take 17 g by mouth daily. 14 each 0  . potassium chloride SA (K-DUR,KLOR-CON) 20 MEQ tablet Take 1 tablet (20 mEq total) by mouth daily. 90 tablet 3  . sertraline (ZOLOFT) 25 MG tablet Take 1 tablet (25 mg total) by mouth daily. 30 tablet 0  . tadalafil, PAH, (ADCIRCA) 20 MG  tablet Take 2 tablets (40 mg total) by mouth daily. 60 tablet 11  . traZODone (DESYREL) 100 MG tablet Take 1 tablet (100 mg total) by mouth at bedtime. 30 tablet 3  . warfarin (COUMADIN) 4 MG tablet TAKE 1/2 TO 1 TABLET BY MOUTH DAILY AS DIRECTED BY COUMADIN CLINIC 30 tablet 2  . megestrol (MEGACE ES) 625 MG/5ML suspension Take 5 mLs (625 mg total) by mouth daily. 150 mL 3   No current facility-administered medications for this encounter.    BP (!) 105/55   Pulse 68   Wt 61.2 kg (135 lb)   SpO2 96% Comment: on 5L of O2  BMI 23.17 kg/m  General: NAD Neck: No JVD, no thyromegaly or thyroid nodule.  Lungs: Clear to auscultation bilaterally with normal respiratory effort. CV: Nondisplaced PMI.  Heart regular S1/S2, no S3/S4, 2/6 HSM LLSB.  No peripheral edema.  No carotid bruit.  Normal pedal pulses.  Abdomen: Soft, nontender, no hepatosplenomegaly, no distention.  Skin: Intact without lesions or rashes.  Neurologic: Alert and oriented x 3.  Psych: Normal affect. Extremities: No clubbing or cyanosis.  HEENT: Normal.    Assessment/Plan: 1. Atrial fibrillation: Paroxysmal.  She is in NSR today on amiodarone, denies palpitations.  - Continue warfarin.  - She will continue amiodarone.  TSH normal recently, check LFTs.  She will need regular eye exam.  2. Chronic diastolic CHF: Echo 70/01 with normal EF, severe LVH, moderate RV systolic function, severe pulmonary hypertension.  Severe LVH in this patient raises concern for either long-standing poorly controlled HTN or cardiac amyloidosis => TcPYP  scan was suggestive of transthyretin amyloidosis, genetic testing suggests hereditary TTR amyloidosis with Val142Ile.  Cannot do cardiac MRI because of pacemaker.  NYHA class III symptoms but she does not appear volume overloaded on exam and weight is down.  - Continue Lasix 80 mg bid.  BMET today.   - She is limited but lives on her own still.  I am going to start her on tafamidis for treatment of hereditary amyloidosis => she has been approved, waiting on patient assistance. She has no children but does have brothers and sisters.  She has told them about her amyloidosis diagnosis and they are thinking about genetic testing.   3. HTN: BP stable on Coreg 3.125 mg bid.  4. Pulmonary hypertension: Severe pulmonary hypertension on echo.  RHC showed pulmonary arterial hypertension.  I am concerned for group 1 PH.  V/Q scan with no chronic PE, PFTs not markedly abnormal.  Serological workup was negative. 6 minute walk today showed severe limitation.  - Continue Opsumit 10 mg daily.  - Continue Adcirca 40 mg daily.   - Use home oxygen.  5. CKD: Stage 3.  BMET today.  6. Tricuspid regurgitation: Severe on last echo, has TR murmur.  7. Sick sinus syndrome: Medtronic PPM.   8. Poor appetite/weight loss: I will let her try low dose Megace for appetite.   Followup in 6 wks.   Loralie Champagne 12/10/2017

## 2017-12-12 ENCOUNTER — Other Ambulatory Visit (HOSPITAL_COMMUNITY): Payer: Self-pay

## 2017-12-12 ENCOUNTER — Telehealth (HOSPITAL_COMMUNITY): Payer: Self-pay | Admitting: Pharmacist

## 2017-12-12 NOTE — Progress Notes (Signed)
Paramedicine Encounter    Patient ID: Natasha Fletcher, female    DOB: May 12, 1925, 84 y.o.   MRN: [id number]   Patient Care Team: Lorene Dy, MD as PCP - General (Internal Medicine) Troy Sine, MD as PCP - Cardiology (Cardiology) Constance Haw, MD as Consulting Physician (Cardiology)  Patient Active Problem List   Diagnosis Date Noted  . Cognitive decline   . Palliative care by specialist   . DNR (do not resuscitate) discussion   . Acute on chronic congestive heart failure ([place])   . Acute on chronic diastolic CHF (congestive heart failure) ([place])   . Sick sinus syndrome due to SA node dysfunction (HCC)   . Shortness of breath 06/13/2017  . Atrial fibrillation ([place]) 03/22/2016  . Bradycardia 08/22/2015  . Junctional (nodal) bradycardia 08/22/2015  . Malnutrition of moderate degree 03/19/2015  . Benign essential HTN   . Bradycardia, drug induced 03/18/2015  . Hypokalemia 02/16/2015  . Thrombocytopenia ([place]) 02/16/2015  . Sepsis due to urinary tract infection ([place])   . Sepsis ([place]) 02/12/2015  . Urinary tract infectious disease   . Long-term (current) use of anticoagulants 07/12/2014  . Chronic anticoagulation 02/11/2014  . Hypertensive cardiovascular disease 01/24/2014  . Chronic diastolic CHF (congestive heart failure) ([place]) 01/24/2014  . Anemia 01/24/2014  . Breast cancer of upper-inner quadrant of right female breast ([place]) 01/24/2014  . Acute diastolic heart failure ([place]) 11/11/2013  . Atrial flutter ([place]) 11/11/2013  . PAF (paroxysmal atrial fibrillation) ([place]) 11/10/2013  . Slurred speech 11/10/2013  . Protein-calorie malnutrition, severe ([place]) 11/06/2013  . Coffee ground emesis 11/05/2013  . Dehydration 10/19/2013  . Urinary retention 10/18/2013  . GERD (gastroesophageal reflux disease) 03/20/2013  . S/P total knee arthroplasty   . UTI (urinary tract infection) 02/21/2013  . OA (osteoarthritis) of knee 02/19/2013  . Hypertension   . Thyroid disease    . Cancer of central portion of female breast ([place]) 04/14/2011    Current Outpatient Medications:  .  amiodarone (PACERONE) 200 MG tablet, Take 0.5 tablets (100 mg total) by mouth daily., Disp: 45 tablet, Rfl: 2 .  carvedilol (COREG) 3.125 MG tablet, TAKE 1 TABLET (3.125 MG TOTAL) BY MOUTH 2 (TWO) TIMES DAILY WITH A MEAL., Disp: 60 tablet, Rfl: 3 .  donepezil (ARICEPT) 5 MG tablet, Take 1 tablet (5 mg total) by mouth at bedtime., Disp: 30 tablet, Rfl: 0 .  ENSURE (ENSURE), Take 237 mLs by mouth 2 (two) times daily. , Disp: , Rfl:  .  ferrous sulfate 325 (65 FE) MG tablet, Take 1 tablet (325 mg total) by mouth every other day. (Patient taking differently: Take 325 mg by mouth every other day. ), Disp: 30 tablet, Rfl: 3 .  furosemide (LASIX) 80 MG tablet, TAKE 1 TABLET BY MOUTH TWICE A DAY FOR EDEMA/HIGH BP, Disp: 60 tablet, Rfl: 1 .  HYDROcodone-acetaminophen (NORCO) 7.5-325 MG tablet, Take 1 tablet by mouth every 6 (six) hours as needed for moderate pain., Disp: 30 tablet, Rfl: 0 .  latanoprost (XALATAN) 0.005 % ophthalmic solution, Place 1 drop into both eyes at bedtime., Disp: , Rfl:  .  LORazepam (ATIVAN) 1 MG tablet, Take 1 mg by mouth at bedtime as needed., Disp: , Rfl: 2 .  macitentan (OPSUMIT) 10 MG tablet, Take 1 tablet (10 mg total) by mouth daily., Disp: 30 tablet, Rfl: 11 .  Melatonin 3 MG TABS, Take 9 mg by mouth., Disp: , Rfl:  .  oxybutynin (DITROPAN) 5 MG tablet, Take 5 mg  by mouth 3 (three) times daily., Disp: , Rfl:  .  polyethylene glycol (MIRALAX / GLYCOLAX) packet, Take 17 g by mouth daily., Disp: 14 each, Rfl: 0 .  potassium chloride SA (K-DUR,KLOR-CON) 20 MEQ tablet, Take 1 tablet (20 mEq total) by mouth daily., Disp: 90 tablet, Rfl: 3 .  sertraline (ZOLOFT) 25 MG tablet, Take 1 tablet (25 mg total) by mouth daily., Disp: 30 tablet, Rfl: 0 .  tadalafil, PAH, (ADCIRCA) 20 MG tablet, Take 2 tablets (40 mg total) by mouth daily., Disp: 60 tablet, Rfl: 11 .  traZODone (DESYREL)  100 MG tablet, Take 1 tablet (100 mg total) by mouth at bedtime., Disp: 30 tablet, Rfl: 3 .  VENTOLIN HFA 108 (90 Base) MCG/ACT inhaler, TAKE 2 PUFFS BY MOUTH EVERY 6 HOURS AS NEEDED FOR WHEEZE OR SHORTNESS OF BREATH, Disp: 1 Inhaler, Rfl: 2 .  warfarin (COUMADIN) 4 MG tablet, TAKE 1/2 TO 1 TABLET BY MOUTH DAILY AS DIRECTED BY COUMADIN CLINIC, Disp: 30 tablet, Rfl: 2 .  megestrol (MEGACE ES) 625 MG/5ML suspension, Take 5 mLs (625 mg total) by mouth daily. (Patient not taking: Reported on 12/12/2017), Disp: 150 mL, Rfl: 3 Allergies  Allergen Reactions  . Lactose Intolerance (Gi) Other (See Comments)    Stomach pain  . Milk-Related Compounds Other (See Comments)    Upset stomach   . Sulfa Antibiotics Itching      Social History   Socioeconomic History  . Marital status: Single    Spouse name: Not on file  . Number of children: 0  . Years of education: 43  . Highest education level: Some college, no degree  Occupational History  . Occupation: retired from [employer]  Social Needs  . Financial resource strain: Not on file  . Food insecurity:    Worry: Not on file    Inability: Not on file  . Transportation needs:    Medical: Not on file    Non-medical: Not on file  Tobacco Use  . Smoking status: Former Smoker    Packs/day: 0.00    Years: 30.00    Pack years: 0.00    Types: Cigarettes    Last attempt to quit: 04/21/1975    Years since quitting: 42.6  . Smokeless tobacco: Never Used  Substance and Sexual Activity  . Alcohol use: Not Currently    Comment: occasionally  . Drug use: No  . Sexual activity: Not Currently  Lifestyle  . Physical activity:    Days per week: Not on file    Minutes per session: Not on file  . Stress: Not on file  Relationships  . Social connections:    Talks on phone: Not on file    Gets together: Not on file    Attends religious service: Not on file    Active member of club or organization: Not on file    Attends meetings of  clubs or organizations: Not on file    Relationship status: Not on file  . Intimate partner violence:    Fear of current or ex partner: Not on file    Emotionally abused: Not on file    Physically abused: Not on file    Forced sexual activity: Not on file  Other Topics Concern  . Not on file  Social History Narrative   Lives alone.  Has a dog.  No children.  Retired from [employer].  Education: some college.     Physical Exam  Future Appointments  Date Time Provider [place]  12/19/2017 11:30 AM Constance Haw, MD [facility]  12/21/2017 10:40 AM Troy Sine, MD [facility]  12/21/2017 11:30 AM [facility]  01/24/2018  9:40 AM Larey Dresser, MD [facility] None  02/01/2018  9:30 AM Posey Pronto, Arvin Collard K, DO [facility] None    BP (!) 98/58   Pulse 80   Resp 15   Wt 134 lb (60.8 kg)   SpO2 98%   BMI 23.00 kg/m   Weight yesterday-134 Last visit weight-134  Pt reports she had a rough wknd, her sob and pains. She does feel better today though.  She went to clinic on Friday, she had been taking lasix 80in am and 40 in pm, dr Aundra Dubin wants her to take 80 BID now.  That will start this week. Pt states her appetite is poor, she was prescribed megace for that but has not p/u from pharmacy yet.  No missed doses of meds this week.  She reports dark urine output, she is going to see her urologist tomor and I advised her to relay to them.  meds verified and pill box refilled.  Minimal swelling to her feet noted.  Reviewed her f/u appointment sch with her. Will see her next week.    Marylouise Stacks, [place] [facility] Paramedic  12/12/17

## 2017-12-12 NOTE — Telephone Encounter (Signed)
Megace PA denied by [employer] Part D since using for off label indication. Will only be approved if cachexia/anorexia caused by AIDS.   Ruta Hinds. Velva Harman, PharmD, BCPS, CPP Clinical Pharmacist Phone: [phone] 12/12/2017 12:03 PM

## 2017-12-13 ENCOUNTER — Other Ambulatory Visit (HOSPITAL_COMMUNITY): Payer: Self-pay | Admitting: Cardiology

## 2017-12-13 DIAGNOSIS — N312 Flaccid neuropathic bladder, not elsewhere classified: Secondary | ICD-10-CM | POA: Diagnosis not present

## 2017-12-19 ENCOUNTER — Ambulatory Visit (INDEPENDENT_AMBULATORY_CARE_PROVIDER_SITE_OTHER): Payer: Medicare HMO | Admitting: Cardiology

## 2017-12-19 ENCOUNTER — Encounter

## 2017-12-19 ENCOUNTER — Encounter: Payer: Self-pay | Admitting: Cardiology

## 2017-12-19 ENCOUNTER — Other Ambulatory Visit (HOSPITAL_COMMUNITY): Payer: Self-pay

## 2017-12-19 VITALS — BP 118/52 | HR 66 | Ht 64.0 in | Wt 132.0 lb

## 2017-12-19 DIAGNOSIS — I10 Essential (primary) hypertension: Secondary | ICD-10-CM

## 2017-12-19 DIAGNOSIS — I495 Sick sinus syndrome: Secondary | ICD-10-CM | POA: Diagnosis not present

## 2017-12-19 DIAGNOSIS — I481 Persistent atrial fibrillation: Secondary | ICD-10-CM

## 2017-12-19 DIAGNOSIS — I4819 Other persistent atrial fibrillation: Secondary | ICD-10-CM

## 2017-12-19 NOTE — Patient Instructions (Signed)
Medication Instructions:  Your physician recommends that you continue on your current medications as directed. Please refer to the Current Medication list given to you today.  *If you need a refill on your cardiac medications before your next appointment, please call your pharmacy*  Labwork: None ordered  Testing/Procedures: Your physician has recommended that you have a Cardioversion (DCCV). Electrical Cardioversion uses a jolt of electricity to your heart either through paddles or wired patches attached to your chest. This is a controlled, usually prescheduled, procedure. Defibrillation is done under light anesthesia in the hospital, and you usually go home the day of the procedure. This is done to get your heart back into a normal rhythm. You are not awake for the procedure.   The nurse will be in contact about scheduling this testing.  You will need to have therapeutic INRs.   CARDIOVERSION INSTRUCTIONS Please arrive at the [facility] main entrance of [facility] on ______ at  ______ Do not eat or drink after midnight prior to procedure Take your medications as normal the morning of your procedure with a sip of water. You will need someone to drive you home at discharge   Follow-Up: Remote monitoring is used to monitor your Pacemaker or ICD from home. This monitoring reduces the number of office visits required to check your device to one time per year. It allows [place] to keep an eye on the functioning of your device to ensure it is working properly. You are scheduled for a device check from home on 03/21/2018. You may send your transmission at any time that day. If you have a wireless device, the transmission will be sent automatically. After your physician reviews your transmission, you will receive a postcard with your next transmission date.  Your physician recommends that you schedule a follow-up appointment in: 3 months with Dr. Curt Bears.  Thank you for choosing [facility]!!   Trinidad Curet, RN [phone]  Any Other Special Instructions Will Be Listed Below (If Applicable).   Electrical Cardioversion Electrical cardioversion is the delivery of a jolt of electricity to restore a normal rhythm to the heart. A rhythm that is too fast or is not regular keeps the heart from pumping well. In this procedure, sticky patches or metal paddles are placed on the chest to deliver electricity to the heart from a device. This procedure may be done in an emergency if:  There is low or no blood pressure as a result of the heart rhythm.  Normal rhythm must be restored as fast as possible to protect the brain and heart from further damage.  It may save a life.  This procedure may also be done for irregular or fast heart rhythms that are not immediately life-threatening. Tell a health care provider about:  Any allergies you have.  All medicines you are taking, including vitamins, herbs, eye drops, creams, and over-the-counter medicines.  Any problems you or family members have had with anesthetic medicines.  Any blood disorders you have.  Any surgeries you have had.  Any medical conditions you have.  Whether you are pregnant or may be pregnant. What are the risks? Generally, this is a safe procedure. However, problems may occur, including:  Allergic reactions to medicines.  A blood clot that breaks free and travels to other parts of your body.  The possible return of an abnormal heart rhythm within hours or days after the procedure.  Your heart stopping (cardiac arrest). This is rare.  What happens before  the procedure? Medicines  Your health care provider may have you start taking: ? Blood-thinning medicines (anticoagulants) so your blood does not clot as easily. ? Medicines may be given to help stabilize your heart rate and rhythm.  Ask your health care provider about changing or stopping your regular medicines. This is especially  important if you are taking diabetes medicines or blood thinners. General instructions  Plan to have someone take you home from the hospital or clinic.  If you will be going home right after the procedure, plan to have someone with you for 24 hours.  Follow instructions from your health care provider about eating or drinking restrictions. What happens during the procedure?  To lower your risk of infection: ? Your health care team will wash or sanitize their hands. ? Your skin will be washed with soap.  An IV tube will be inserted into one of your veins.  You will be given a medicine to help you relax (sedative).  Sticky patches (electrodes) or metal paddles may be placed on your chest.  An electrical shock will be delivered. The procedure may vary among health care providers and hospitals. What happens after the procedure?  Your blood pressure, heart rate, breathing rate, and blood oxygen level will be monitored until the medicines you were given have worn off.  Do not drive for 24 hours if you were given a sedative.  Your heart rhythm will be watched to make sure it does not change. This information is not intended to replace advice given to you by your health care provider. Make sure you discuss any questions you have with your health care provider. Document Released: 03/12/2002 Document Revised: 11/19/2015 Document Reviewed: 09/26/2015 Elsevier Interactive Patient Education  2017 [employer].

## 2017-12-19 NOTE — Progress Notes (Signed)
Electrophysiology Office Note   Date:  12/19/2017   ID:  Natasha Fletcher, DOB Jan 07, 1926, MRN [id number]  PCP:  Lorene Dy, MD  Cardiologist:  Claiborne Billings Primary Electrophysiologist:  Lysette Lindenbaum Meredith Leeds, MD    No chief complaint on file.    History of Present Illness: Natasha Fletcher is a 84 y.o. female who presents today for electrophysiology evaluation.   She was diagnosed with atrial fibrillation with RVR and 2015 when she was hospitalized. She was found to have an EF of 65-70% with grade 2 diastolic dysfunction and an intracavitary gradient of 16 mmHg. She was anticoagulated and was cardioverted in October 2015. In October 2016, she was found to be in atrial flutter with 4-1 conduction. She had an echocardiogram at that time showed an EF 55-60% and severe LVH. She had moderate biatrial dilation. She was admitted to [facility] December 2016 she was found to be in atrial fibrillation. She was referred for cardioversion on 06/04/15, and is subsequently presented back to her cardiologist clinic in atrial flutter with 4-1 conduction. Had Medtronic pacemaker placed 08/22/15.  Today, denies symptoms of palpitations, chest pain, orthopnea, PND, lower extremity edema, claudication, dizziness, presyncope, syncope, bleeding, or neurologic sequela. The patient is tolerating medications without difficulties.  Her main complaint is shortness of breath.  This is been progressive over the last few months.  She was noted on her device interrogation to be in atrial flutter which has been going on for the last few months as well.  She is currently on oxygen at home.  Past Medical History:  Diagnosis Date  . Arthritis    "all over"  . Asthma   . Atrial fibrillation ([place])    a. s/p DCCV in 01/2014 b. repeat DCCV in 06/2015, started on Amiodarone at that time. On Coumadin for anticoagulation.  . Cancer ([place])    rt breast  . Chronic diastolic CHF (congestive heart failure) ([place])    a. 08/2015: Echo  with EF of 60-65%, no WMA, LA mod dilated, mild TR.  . Diabetes mellitus without complication ([place])   . Edema leg   . Fast breathing    "because of pill"  . GERD (gastroesophageal reflux disease)   . Glaucoma   . Glaucoma   . Hypertension   . Incontinence   . Pneumonia    h/o younger  . S/P total knee arthroplasty   . SSS (sick sinus syndrome) ([place])    a. s/p Medtronic Advisa DR MRI [id number] (serial number [id number]) PPM placement in 08/2015  . Swelling    bilateral feet/ legs, more left.  . Thyroid disease    "something years ago"  . UTI (urinary tract infection) 02/12/2015  . Wears dentures   . Wears glasses    Past Surgical History:  Procedure Laterality Date  . ABDOMINAL HYSTERECTOMY    . APPENDECTOMY    . BREAST BIOPSY  04/27/2011   Procedure: BREAST BIOPSY WITH NEEDLE LOCALIZATION;  Surgeon: Edward Jolly, MD;  Location: [place];  Service: General;  Laterality: Right;  right needle localized breast lumpectomy   . BREAST LUMPECTOMY  04/27/11   right breast by [place]  . CARDIOVERSION N/A 02/01/2014   Procedure: CARDIOVERSION;  Surgeon: Candee Furbish, MD;  Location: [facility] ENDOSCOPY;  Service: Cardiovascular;  Laterality: N/A;  . CARDIOVERSION N/A 06/04/2015   Procedure: CARDIOVERSION;  Surgeon: Dorothy Spark, MD;  Location: [place];  Service: Cardiovascular;  Laterality: N/A;  . CARDIOVERSION  N/A 07/14/2015   Procedure: CARDIOVERSION;  Surgeon: Thayer Headings, MD;  Location: [facility] ENDOSCOPY;  Service: Cardiovascular;  Laterality: N/A;  . CARPAL TUNNEL RELEASE Bilateral   . COLONOSCOPY    . EP IMPLANTABLE DEVICE N/A 08/22/2015   Procedure: Pacemaker Implant;  Surgeon: Clyde Zarrella Meredith Leeds, MD;  Medtronic Advisa DR MRI [phone] [id number] (serial number [id number]);  Laterality: N/A;  . EYE SURGERY     both cataracts  . RIGHT HEART CATH N/A 04/21/2017   Procedure: RIGHT HEART CATH;  Surgeon: Larey Dresser, MD;  Location: [place] CV LAB;  Service:  Cardiovascular;  Laterality: N/A;  . SHOULDER SURGERY Left   . TEE WITHOUT CARDIOVERSION N/A 02/01/2014   Procedure: TRANSESOPHAGEAL ECHOCARDIOGRAM (TEE);  Surgeon: Candee Furbish, MD;  Location: [facility] ENDOSCOPY;  Service: Cardiovascular;  Laterality: N/A;  . TONSILLECTOMY    . TOTAL HIP ARTHROPLASTY Bilateral   . TOTAL KNEE ARTHROPLASTY Right 02/19/2013   Procedure: RIGHT TOTAL KNEE ARTHROPLASTY;  Surgeon: Gearlean Alf, MD;  Location: [facility] ORS;  Service: Orthopedics;  Laterality: Right;  . TOTAL KNEE ARTHROPLASTY Left 10/15/2013   Procedure: LEFT TOTAL KNEE ARTHROPLASTY;  Surgeon: Gearlean Alf, MD;  Location: [facility] ORS;  Service: Orthopedics;  Laterality: Left;     Current Outpatient Medications  Medication Sig Dispense Refill  . amiodarone (PACERONE) 200 MG tablet Take 0.5 tablets (100 mg total) by mouth daily. 45 tablet 2  . carvedilol (COREG) 3.125 MG tablet TAKE 1 TABLET (3.125 MG TOTAL) BY MOUTH 2 (TWO) TIMES DAILY WITH A MEAL. 60 tablet 3  . donepezil (ARICEPT) 5 MG tablet Take 1 tablet (5 mg total) by mouth at bedtime. 30 tablet 0  . ENSURE (ENSURE) Take 237 mLs by mouth 2 (two) times daily.     . ferrous sulfate 325 (65 FE) MG tablet Take 1 tablet (325 mg total) by mouth every other day. (Patient taking differently: Take 325 mg by mouth every other day. ) 30 tablet 3  . furosemide (LASIX) 80 MG tablet TAKE 1 TABLET BY MOUTH TWICE A DAY FOR EDEMA/HIGH BP 60 tablet 3  . HYDROcodone-acetaminophen (NORCO) 7.5-325 MG tablet Take 1 tablet by mouth every 6 (six) hours as needed for moderate pain. 30 tablet 0  . latanoprost (XALATAN) 0.005 % ophthalmic solution Place 1 drop into both eyes at bedtime.    Marland Kitchen LORazepam (ATIVAN) 1 MG tablet Take 1 mg by mouth at bedtime as needed.  2  . macitentan (OPSUMIT) 10 MG tablet Take 1 tablet (10 mg total) by mouth daily. 30 tablet 11  . megestrol (MEGACE ES) 625 MG/5ML suspension Take 5 mLs (625 mg total) by mouth daily. 150 mL 3  . Melatonin 3 MG TABS Take  9 mg by mouth.    . oxybutynin (DITROPAN) 5 MG tablet Take 5 mg by mouth 3 (three) times daily.    . polyethylene glycol (MIRALAX / GLYCOLAX) packet Take 17 g by mouth daily. 14 each 0  . potassium chloride SA (K-DUR,KLOR-CON) 20 MEQ tablet Take 1 tablet (20 mEq total) by mouth daily. 90 tablet 3  . sertraline (ZOLOFT) 25 MG tablet Take 1 tablet (25 mg total) by mouth daily. 30 tablet 0  . tadalafil, PAH, (ADCIRCA) 20 MG tablet Take 2 tablets (40 mg total) by mouth daily. 60 tablet 11  . traZODone (DESYREL) 100 MG tablet TAKE 1 TABLET BY MOUTH EVERYDAY AT BEDTIME 90 tablet 0  . VENTOLIN HFA 108 (90 Base) MCG/ACT inhaler TAKE 2  PUFFS BY MOUTH EVERY 6 HOURS AS NEEDED FOR WHEEZE OR SHORTNESS OF BREATH 1 Inhaler 2  . warfarin (COUMADIN) 4 MG tablet TAKE 1/2 TO 1 TABLET BY MOUTH DAILY AS DIRECTED BY COUMADIN CLINIC 30 tablet 2   No current facility-administered medications for this visit.     Allergies:   Lactose intolerance (gi); Milk-related compounds; and Sulfa antibiotics   Social History:  The patient  reports that she quit smoking about 42 years ago. Her smoking use included cigarettes. She smoked 0.00 packs per day for 30.00 years. She has never used smokeless tobacco. She reports that she drank alcohol. She reports that she does not use drugs.   Family History:  The patient's family history includes Heart disease in her mother; Hypertension in her father and mother; Stroke in her brother, father, and mother.    ROS:  Please see the history of present illness.   Otherwise, review of systems is positive for weight change, fatigue, appetite change, chest pain, leg swelling, leg pain, shortness of breath, hearing loss, visual changes, constipation, abdominal pain, depression, dizziness, balance problems, headaches.   All other systems are reviewed and negative.   PHYSICAL EXAM: VS:  BP (!) 118/52   Pulse 66   Ht 5\' 4"  (1.626 m)   Wt 132 lb (59.9 kg)   SpO2 97%   BMI 22.66 kg/m  , BMI Body  mass index is 22.66 kg/m. GEN: Well nourished, well developed, in no acute distress  HEENT: normal  Neck: no JVD, carotid bruits, or masses Cardiac: iRRR; no murmurs, rubs, or gallops,no edema  Respiratory:  clear to auscultation bilaterally, normal work of breathing GI: soft, nontender, nondistended, + BS MS: no deformity or atrophy  Skin: warm and dry, device site well healed Neuro:  Strength and sensation are intact Psych: euthymic mood, full affect  EKG:  EKG is ordered today. Personal review of the ekg ordered shows atrial flutter, rate 66  Personal review of the device interrogation today. Results in [place]: 01/17/2017: NT-Pro BNP 4,311 06/13/2017: B Natriuretic Peptide 553.0; Magnesium 2.5 10/20/2017: Hemoglobin 9.8; Platelets 235; TSH 0.662 12/09/2017: ALT 13; BUN 44; Creatinine, Ser 1.94; Potassium 4.6; Sodium 140    Lipid Panel  No results found for: CHOL, TRIG, HDL, CHOLHDL, VLDL, LDLCALC, LDLDIRECT   Wt Readings from Last 3 Encounters:  12/19/17 132 lb (59.9 kg)  12/12/17 134 lb (60.8 kg)  12/09/17 135 lb (61.2 kg)      Other studies Reviewed: Additional studies/ records that were reviewed today include: TTE 02/22/17 Review of the above records today demonstrates:  - Left ventricle: The cavity size was normal. There was severe   concentric hypertrophy. Systolic function was vigorous. The   estimated ejection fraction was in the range of 65% to 70%. Wall   motion was normal; there were no regional wall motion   abnormalities. Doppler parameters are consistent with a   reversible restrictive pattern, indicative of decreased left   ventricular diastolic compliance and/or increased left atrial   pressure (grade 3 diastolic dysfunction). - Aortic valve: There was mild regurgitation. - Aortic root: The aortic root was normal in size. - Mitral valve: There was mild regurgitation. - Left atrium: The atrium was mildly dilated. - Right ventricle: The  cavity size was moderately dilated. Wall   thickness was normal. Systolic function was moderately reduced. - Right atrium: The atrium was mildly dilated. - Tricuspid valve: There was severe regurgitation. - Pulmonic valve: There  was mild regurgitation. - Pulmonary arteries: Systolic pressure was severely increased. PA   peak pressure: 79 mm Hg (S). - Inferior vena cava: The vessel was normal in size. - Pericardium, extracardiac: There was no pericardial effusion.   ASSESSMENT AND PLAN:  1.  Persistent Atrial fibrillation/flutter: On Coumadin.  She is on amiodarone as well.  Unfortunately she is in atrial flutter and has been for the last few months.  We Tonna Palazzi plan for cardioversion. This patients CHA2DS2-VASc Score and unadjusted Ischemic Stroke Rate (% per year) is equal to 7.2 % stroke rate/year from a score of 5  Above score calculated as 1 point each if present [CHF, HTN, DM, Vascular=MI/PAD/Aortic Plaque, Age if 65-74, or Female] Above score calculated as 2 points each if present [Age > 75, or Stroke/TIA/TE]   2. Sick sinus syndrome: Medtronic pacemaker implanted 08/22/2015.  Device functioning appropriately.  No changes.  3. Pulmonary hypertension: No evidence of heart failure on exam.  No changes.  4. Hypertension: Well-controlled.  No changes.  Current medicines are reviewed at length with the patient today.   The patient does not have concerns regarding her medicines.  The following changes were made today: None  Labs/ tests ordered today include:  Orders Placed This Encounter  Procedures  . EKG 12-Lead   Disposition:   FU with Zenna Traister 3 months  Signed, [place] Meredith Leeds, MD  12/19/2017 12:03 PM     [address] [place] [address] [phone] (office) [phone] (fax)

## 2017-12-19 NOTE — Progress Notes (Signed)
Paramedicine Encounter    Patient ID: Natasha Fletcher, female    DOB: 1925-06-03, 84 y.o.   MRN: [id number]   Patient Care Team: Lorene Dy, MD as PCP - General (Internal Medicine) Constance Haw, MD as PCP - Electrophysiology (Cardiology) Troy Sine, MD as PCP - Cardiology (Cardiology)  Patient Active Problem List   Diagnosis Date Noted  . Cognitive decline   . Palliative care by specialist   . DNR (do not resuscitate) discussion   . Acute on chronic congestive heart failure ([place])   . Acute on chronic diastolic CHF (congestive heart failure) ([place])   . Sick sinus syndrome due to SA node dysfunction ([facility])   . Shortness of breath 06/13/2017  . Atrial fibrillation ([place]) 03/22/2016  . Bradycardia 08/22/2015  . Junctional (nodal) bradycardia 08/22/2015  . Malnutrition of moderate degree 03/19/2015  . Benign essential HTN   . Bradycardia, drug induced 03/18/2015  . Hypokalemia 02/16/2015  . Thrombocytopenia ([place]) 02/16/2015  . Sepsis due to urinary tract infection ([place])   . Sepsis ([place]) 02/12/2015  . Urinary tract infectious disease   . Long-term (current) use of anticoagulants 07/12/2014  . Chronic anticoagulation 02/11/2014  . Hypertensive cardiovascular disease 01/24/2014  . Chronic diastolic CHF (congestive heart failure) ([place]) 01/24/2014  . Anemia 01/24/2014  . Breast cancer of upper-inner quadrant of right female breast ([place]) 01/24/2014  . Acute diastolic heart failure ([place]) 11/11/2013  . Atrial flutter ([place]) 11/11/2013  . PAF (paroxysmal atrial fibrillation) ([place]) 11/10/2013  . Slurred speech 11/10/2013  . Protein-calorie malnutrition, severe ([place]) 11/06/2013  . Coffee ground emesis 11/05/2013  . Dehydration 10/19/2013  . Urinary retention 10/18/2013  . GERD (gastroesophageal reflux disease) 03/20/2013  . S/P total knee arthroplasty   . UTI (urinary tract infection) 02/21/2013  . OA (osteoarthritis) of knee 02/19/2013  . Hypertension   . Thyroid disease    . Cancer of central portion of female breast ([place]) 04/14/2011    Current Outpatient Medications:  .  amiodarone (PACERONE) 200 MG tablet, Take 0.5 tablets (100 mg total) by mouth daily., Disp: 45 tablet, Rfl: 2 .  carvedilol (COREG) 3.125 MG tablet, TAKE 1 TABLET (3.125 MG TOTAL) BY MOUTH 2 (TWO) TIMES DAILY WITH A MEAL., Disp: 60 tablet, Rfl: 3 .  donepezil (ARICEPT) 5 MG tablet, Take 1 tablet (5 mg total) by mouth at bedtime., Disp: 30 tablet, Rfl: 0 .  ENSURE (ENSURE), Take 237 mLs by mouth 2 (two) times daily. , Disp: , Rfl:  .  ferrous sulfate 325 (65 FE) MG tablet, Take 1 tablet (325 mg total) by mouth every other day. (Patient taking differently: Take 325 mg by mouth every other day. ), Disp: 30 tablet, Rfl: 3 .  furosemide (LASIX) 80 MG tablet, TAKE 1 TABLET BY MOUTH TWICE A DAY FOR EDEMA/HIGH BP, Disp: 60 tablet, Rfl: 3 .  HYDROcodone-acetaminophen (NORCO) 7.5-325 MG tablet, Take 1 tablet by mouth every 6 (six) hours as needed for moderate pain., Disp: 30 tablet, Rfl: 0 .  latanoprost (XALATAN) 0.005 % ophthalmic solution, Place 1 drop into both eyes at bedtime., Disp: , Rfl:  .  LORazepam (ATIVAN) 1 MG tablet, Take 1 mg by mouth at bedtime as needed., Disp: , Rfl: 2 .  macitentan (OPSUMIT) 10 MG tablet, Take 1 tablet (10 mg total) by mouth daily., Disp: 30 tablet, Rfl: 11 .  Melatonin 3 MG TABS, Take 9 mg by mouth., Disp: , Rfl:  .  oxybutynin (DITROPAN) 5 MG tablet, Take 5  mg by mouth 3 (three) times daily., Disp: , Rfl:  .  polyethylene glycol (MIRALAX / GLYCOLAX) packet, Take 17 g by mouth daily., Disp: 14 each, Rfl: 0 .  potassium chloride SA (K-DUR,KLOR-CON) 20 MEQ tablet, Take 1 tablet (20 mEq total) by mouth daily., Disp: 90 tablet, Rfl: 3 .  sertraline (ZOLOFT) 25 MG tablet, Take 1 tablet (25 mg total) by mouth daily., Disp: 30 tablet, Rfl: 0 .  tadalafil, PAH, (ADCIRCA) 20 MG tablet, Take 2 tablets (40 mg total) by mouth daily., Disp: 60 tablet, Rfl: 11 .  traZODone  (DESYREL) 100 MG tablet, TAKE 1 TABLET BY MOUTH EVERYDAY AT BEDTIME, Disp: 90 tablet, Rfl: 0 .  VENTOLIN HFA 108 (90 Base) MCG/ACT inhaler, TAKE 2 PUFFS BY MOUTH EVERY 6 HOURS AS NEEDED FOR WHEEZE OR SHORTNESS OF BREATH, Disp: 1 Inhaler, Rfl: 2 .  warfarin (COUMADIN) 4 MG tablet, TAKE 1/2 TO 1 TABLET BY MOUTH DAILY AS DIRECTED BY COUMADIN CLINIC, Disp: 30 tablet, Rfl: 2 .  megestrol (MEGACE ES) 625 MG/5ML suspension, Take 5 mLs (625 mg total) by mouth daily. (Patient not taking: Reported on 12/19/2017), Disp: 150 mL, Rfl: 3 Allergies  Allergen Reactions  . Lactose Intolerance (Gi) Other (See Comments)    Stomach pain  . Milk-Related Compounds Other (See Comments)    Upset stomach   . Sulfa Antibiotics Itching      Social History   Socioeconomic History  . Marital status: Single    Spouse name: Not on file  . Number of children: 0  . Years of education: 75  . Highest education level: Some college, no degree  Occupational History  . Occupation: retired from [employer]  Social Needs  . Financial resource strain: Not on file  . Food insecurity:    Worry: Not on file    Inability: Not on file  . Transportation needs:    Medical: Not on file    Non-medical: Not on file  Tobacco Use  . Smoking status: Former Smoker    Packs/day: 0.00    Years: 30.00    Pack years: 0.00    Types: Cigarettes    Last attempt to quit: 04/21/1975    Years since quitting: 42.6  . Smokeless tobacco: Never Used  Substance and Sexual Activity  . Alcohol use: Not Currently    Comment: occasionally  . Drug use: No  . Sexual activity: Not Currently  Lifestyle  . Physical activity:    Days per week: Not on file    Minutes per session: Not on file  . Stress: Not on file  Relationships  . Social connections:    Talks on phone: Not on file    Gets together: Not on file    Attends religious service: Not on file    Active member of club or organization: Not on file    Attends meetings of  clubs or organizations: Not on file    Relationship status: Not on file  . Intimate partner violence:    Fear of current or ex partner: Not on file    Emotionally abused: Not on file    Physically abused: Not on file    Forced sexual activity: Not on file  Other Topics Concern  . Not on file  Social History Narrative   Lives alone.  Has a dog.  No children.  Retired from [employer].  Education: some college.     Physical Exam  Future Appointments  Date Time Provider [place]  12/21/2017 10:40 AM Troy Sine, MD [facility]  12/21/2017 11:30 AM [facility]  01/24/2018  9:40 AM Larey Dresser, MD [facility] None  02/01/2018  9:30 AM Narda Amber K, DO [facility] None    BP 114/60   Pulse 62   Resp 15   Wt 132 lb (59.9 kg)   SpO2 98%   BMI 22.66 kg/m   Weight yesterday-129 Last visit weight-134  Pt reports she had episode this morning of not feeling well, she usually has these good and bad days. Pt went to see doc this morning-she is having episodes of a-flutter and he wants her to be sch for cardioversion. Pt reports they are trying to get her sch for next week.  Pt has not p/u her megace yet-she reports insurance has not approved it--I will call [facility] and see what is going on---so that medicine needs a PA before it can get filled so I will contact clinic tomor since dr Aundra Dubin prescribed it.  Pt approved for the vyndaquel however co-pay is $955 for one month--called [facility] and applied for med assist for that medicine. They are going to mail her the enrollment form and she will need to submit her SSI benefit and will assist her in filling it out and will fax it back to them.  No missed doses of her meds this week. meds verified and pill box refilled.  Pt reports she has been eating tomato soup for 3 meals  --I advised her to eat that with caution as that contains a lot of sodium. She is  aware of that, however her appetite is very poor so I suspect she isnt eating much at those times anyways.     Marylouise Stacks, [facility] Paramedic  12/19/17

## 2017-12-20 LAB — CUP PACEART INCLINIC DEVICE CHECK
Battery Remaining Longevity: 66 mo
Battery Voltage: 3 V
Brady Statistic AP VP Percent: 3.9 %
Brady Statistic AS VP Percent: 26.4 %
Implantable Lead Implant Date: 20170519
Implantable Lead Location: 753859
Implantable Lead Location: 753860
Implantable Lead Model: 5076
Implantable Pulse Generator Implant Date: 20170519
Lead Channel Impedance Value: 418 Ohm
Lead Channel Pacing Threshold Pulse Width: 0.4 ms
Lead Channel Sensing Intrinsic Amplitude: 0.8 mV
Lead Channel Setting Pacing Pulse Width: 0.4 ms
Lead Channel Setting Sensing Sensitivity: 2 mV
MDC IDC LEAD IMPLANT DT: 20170519
MDC IDC MSMT LEADCHNL RV IMPEDANCE VALUE: 418 Ohm
MDC IDC MSMT LEADCHNL RV PACING THRESHOLD AMPLITUDE: 1.5 V
MDC IDC MSMT LEADCHNL RV SENSING INTR AMPL: 17.3 mV
MDC IDC SESS DTM: 20190917115357
MDC IDC SET LEADCHNL RA PACING AMPLITUDE: 2 V
MDC IDC SET LEADCHNL RV PACING AMPLITUDE: 2.5 V
MDC IDC STAT BRADY AP VS PERCENT: 53.2 %
MDC IDC STAT BRADY AS VS PERCENT: 16.6 %

## 2017-12-21 ENCOUNTER — Encounter: Payer: Self-pay | Admitting: Cardiovascular Disease

## 2017-12-21 ENCOUNTER — Other Ambulatory Visit (HOSPITAL_COMMUNITY): Payer: Self-pay

## 2017-12-21 ENCOUNTER — Ambulatory Visit (INDEPENDENT_AMBULATORY_CARE_PROVIDER_SITE_OTHER): Payer: Medicare HMO | Admitting: Pharmacist Clinician (PhC)/ Clinical Pharmacy Specialist

## 2017-12-21 ENCOUNTER — Ambulatory Visit: Payer: Medicare HMO | Admitting: Cardiovascular Disease

## 2017-12-21 VITALS — BP 118/52 | HR 62 | Ht 66.0 in | Wt 132.4 lb

## 2017-12-21 DIAGNOSIS — I5032 Chronic diastolic (congestive) heart failure: Secondary | ICD-10-CM

## 2017-12-21 DIAGNOSIS — I495 Sick sinus syndrome: Secondary | ICD-10-CM

## 2017-12-21 DIAGNOSIS — E852 Heredofamilial amyloidosis, unspecified: Secondary | ICD-10-CM | POA: Diagnosis not present

## 2017-12-21 DIAGNOSIS — N184 Chronic kidney disease, stage 4 (severe): Secondary | ICD-10-CM | POA: Diagnosis not present

## 2017-12-21 DIAGNOSIS — I272 Pulmonary hypertension, unspecified: Secondary | ICD-10-CM

## 2017-12-21 DIAGNOSIS — Z7901 Long term (current) use of anticoagulants: Secondary | ICD-10-CM | POA: Diagnosis not present

## 2017-12-21 DIAGNOSIS — I4819 Other persistent atrial fibrillation: Secondary | ICD-10-CM

## 2017-12-21 DIAGNOSIS — G63 Polyneuropathy in diseases classified elsewhere: Secondary | ICD-10-CM

## 2017-12-21 DIAGNOSIS — I481 Persistent atrial fibrillation: Secondary | ICD-10-CM

## 2017-12-21 DIAGNOSIS — I480 Paroxysmal atrial fibrillation: Secondary | ICD-10-CM

## 2017-12-21 LAB — POCT INR: INR: 3.4 — AB (ref 2.0–3.0)

## 2017-12-21 NOTE — Progress Notes (Signed)
Came out today for med rec of coumadin dosing per coumadin clinic.  No dose for tonight and will start whole tab on mon and fri and 1/2 other days.  Will see her again next week.   Marylouise Stacks, EMT-Paramedic  12/21/17

## 2017-12-21 NOTE — Patient Instructions (Addendum)
Medication Instructions:  Your physician recommends that you continue on your current medications as directed. Please refer to the Current Medication list given to you today.  Follow-Up: Keep follow up with Dr. Aundra Dubin and Dr. Curt Bears  Your physician wants you to follow-up in: 6 months with Dr. Claiborne Billings.  You will receive a reminder letter in the mail two months in advance. If you don't receive a letter, please call our office to schedule the follow-up appointment.   Any Other Special Instructions Will Be Listed Below (If Applicable).     If you need a refill on your cardiac medications before your next appointment, please call your pharmacy.

## 2017-12-21 NOTE — Progress Notes (Signed)
Patient ID: Natasha Fletcher, female   DOB: Dec 26, 1925, 84 y.o.   MRN: [id number]   HPI: Natasha Fletcher is a 84 y.o. female who presents to the office today for a 4 month follow-up cardiology evaluation.    [name] is status post right total knee replacement in July 2015 and developed postoperative anemia, urinary retention and dehydration.  She underwent red cell transfusion.  In August 2015, she developed acute renal failure was omitted to the emergency room with a creatinine of 7.2, potassium 6.2.  She was hydrated, her ACE inhibitor inhibitor was discontinued and a Foley was placed.  She subsequently developed an episode of presyncope and was found to be in atrial fibrillation with RVR leading to her readmission.  Several days later.  Cardiology consultation was obtained in the hospital on 11/11/2013.  An echo revealed ejection fraction of 65-70% with grade 2 diastolic dysfunction and there was evidence for an intracavity gradient of 16 mm.  She was on anticoagulation and ultimately underwent cardioversion in October 2015.  She was in normal sinus rhythm.  On subsequent follow-up in November 2015.  She saw Kerin Ransom in March 2016.  When I saw  her in October 2016 her ECG showed atrial flutter with 4:1 conduction.  I recommended an echo Doppler study which showed an EF of 55-60% and evidence for severe LVH.  There was moderate left atrial and mild biatrial dilatation.  I was to see her back in follow-up but I have not seen her since. Since I saw her in December 2016 she was admitted to [facility] hospital with a UTI and was in atrial fibrillation. Cardizem and metoprolol were increased, although she developed significant bradycardia and hypotension leading to subsequent dose reduction. In early February.  She was found to be in atrial fibrillation with rapid ventricular response.  Since her hospitalization, she has been seen by several APPs. She also really was referred for a cardioversion for atrial  fibrillation which was done on 06/04/2015 by Dr. Meda Coffee. She presents now for follow-up evaluation.  She underwent an Medtronic permanent pacemaker implantation for sick sinus syndrome in May 2017.  She has been on chronic anticoagulation with warfarin.  She has had issues with shortness of breath and lower extremity edema necessitating increasing diuretic therapy.   An echo Doppler study on 01/19/2018showed severe LVH with normal systolic function with an EF of 60-65%.  There was grade 2 diastolic dysfunction.  Was moderate tricuspid regurgitation.  She had severe PA pressure increased at 69 mmHg.    She has seen Dr. Curt Bears in April 2018.  She was having shortness of breath but was not felt to have evidence for heart failure on exam.  Device interrogation showed flat histograms without much are rate variability.  Her blood pressure was stable.  She was seen by Bernerd Pho 01/17/2017 and again had complaints of shortness of breath and fatigue.  She did not appear to be volume overload by physical exam.  Her echo in January 2018 showed an EF of 60-65% with grade 2 diastolic dysfunction, moderate TR, and elevated pulmonary pressures.  She was not having any palpitations.  Her blood pressure was controlled.    When I  saw her in November 2018, I recommended a follow-up echo Doppler study to reassess her pulmonary hypertension.  This showed hyperdynamic LV function with severe LVH and grade 3 diastolic dysfunction.  There was mild aortic insufficiency, moderate RV dilation with reduced RV function, severe TR, and severe  pulmonary hypertension with PA systolic pressure at 79 mm.  As result, I referred her to Dr. Benjamine Mola for further evaluation.  A right left heart cardiac catheterization confirmed severe pulmonary arterial hypertension with a PA at 72/22 and a mean of 41.  PVR was 5.4 WU. She socially underwent PFTs as well as a VQ scan.  VQ scan did not reveal any chronic PE.  PFTs were not markedly  abnormal.  With her severe LVH there was some concern for cardiac amyloidosis, TTR versus AL and because of her pacemaker she is not a candidate for cardiac MRI.  She underwent a serologic studies which essentially were negative.  She had a negative urine immunofixation, negative ANA, negative anti-Jo 1 antibody, negative anti-SCL 70, SPEP with faint M spike, and RF was negative.  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  Genetic testing for transthyretin amyloidosis is wild-type versus genetic were apparently sent.  She has been on Opsumit and Adcirca 20 mg daily was added to her regimen. Creatinine was 1.87 in March 2019.  Since I last saw her, she has undergone several evaluations with Dr. Aundra Dubin.  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  She could not have a cardiac MRI because of her pacemaker.  She has continued to be on OPSUMIT and Adcirca and plan is to initiate tafamidis for treatment of her hereditary amyloid.  She is continued to be on Lasix 80 mg twice a day and has not been felt to have significant volume overload.  She has continued to be on carvedilol.  Presently, she still notes some shortness of breath with activity.  She has moved from a 7 room house to a 3 room apartment.  She has had poor appetite and is on Nche M sure.  She has renal insufficiency with most recent creatinine increasing to 1.9.  Her sister is apparently severely ill with kidney failure.  She presents for evaluation.   Past Medical History:  Diagnosis Date  . Arthritis    "all over"  . Asthma   . Atrial fibrillation ([place])    a. s/p DCCV in 01/2014 b. repeat DCCV in 06/2015, started on Amiodarone at that time. On Coumadin for anticoagulation.  . Cancer ([place])    rt breast  . Chronic diastolic CHF (congestive heart failure) ([place])    a. 08/2015: Echo with EF of 60-65%, no WMA, LA mod dilated, mild TR.  . Diabetes mellitus without complication  ([place])   . Edema leg   . Fast breathing    "because of pill"  . GERD (gastroesophageal reflux disease)   . Glaucoma   . Glaucoma   . Hypertension   . Incontinence   . Pneumonia    h/o younger  . S/P total knee arthroplasty   . SSS (sick sinus syndrome) ([place])    a. s/p Medtronic Advisa DR MRI [id number] (serial number [id number]) PPM placement in 08/2015  . Swelling    bilateral feet/ legs, more left.  . Thyroid disease    "something years ago"  . UTI (urinary tract infection) 02/12/2015  . Wears dentures   . Wears glasses     Past Surgical History:  Procedure Laterality Date  . ABDOMINAL HYSTERECTOMY    . APPENDECTOMY    . BREAST BIOPSY  04/27/2011   Procedure: BREAST BIOPSY WITH NEEDLE LOCALIZATION;  Surgeon: Edward Jolly, MD;  Location: [place];  Service: General;  Laterality: Right;  right needle localized breast lumpectomy   . BREAST LUMPECTOMY  04/27/11   right breast by [place]  . CARDIOVERSION N/A 02/01/2014   Procedure: CARDIOVERSION;  Surgeon: Candee Furbish, MD;  Location: [facility] ENDOSCOPY;  Service: Cardiovascular;  Laterality: N/A;  . CARDIOVERSION N/A 06/04/2015   Procedure: CARDIOVERSION;  Surgeon: Dorothy Spark, MD;  Location: [place];  Service: Cardiovascular;  Laterality: N/A;  . CARDIOVERSION N/A 07/14/2015   Procedure: CARDIOVERSION;  Surgeon: Thayer Headings, MD;  Location: [facility] ENDOSCOPY;  Service: Cardiovascular;  Laterality: N/A;  . CARPAL TUNNEL RELEASE Bilateral   . COLONOSCOPY    . EP IMPLANTABLE DEVICE N/A 08/22/2015   Procedure: Pacemaker Implant;  Surgeon: Will Meredith Leeds, MD;  Medtronic Advisa DR MRI [phone] (serial number [id number]);  Laterality: N/A;  . EYE SURGERY     both cataracts  . RIGHT HEART CATH N/A 04/21/2017   Procedure: RIGHT HEART CATH;  Surgeon: Larey Dresser, MD;  Location: [place] CV LAB;  Service: Cardiovascular;  Laterality: N/A;  . SHOULDER SURGERY Left   . TEE WITHOUT CARDIOVERSION N/A  02/01/2014   Procedure: TRANSESOPHAGEAL ECHOCARDIOGRAM (TEE);  Surgeon: Candee Furbish, MD;  Location: [facility] ENDOSCOPY;  Service: Cardiovascular;  Laterality: N/A;  . TONSILLECTOMY    . TOTAL HIP ARTHROPLASTY Bilateral   . TOTAL KNEE ARTHROPLASTY Right 02/19/2013   Procedure: RIGHT TOTAL KNEE ARTHROPLASTY;  Surgeon: Gearlean Alf, MD;  Location: [facility] ORS;  Service: Orthopedics;  Laterality: Right;  . TOTAL KNEE ARTHROPLASTY Left 10/15/2013   Procedure: LEFT TOTAL KNEE ARTHROPLASTY;  Surgeon: Gearlean Alf, MD;  Location: [facility] ORS;  Service: Orthopedics;  Laterality: Left;    Allergies  Allergen Reactions  . Lactose Intolerance (Gi) Other (See Comments)    Stomach pain  . Milk-Related Compounds Other (See Comments)    Upset stomach   . Sulfa Antibiotics Itching    Current Outpatient Medications  Medication Sig Dispense Refill  . amiodarone (PACERONE) 200 MG tablet Take 0.5 tablets (100 mg total) by mouth daily. 45 tablet 2  . carvedilol (COREG) 3.125 MG tablet TAKE 1 TABLET (3.125 MG TOTAL) BY MOUTH 2 (TWO) TIMES DAILY WITH A MEAL. 60 tablet 3  . donepezil (ARICEPT) 5 MG tablet Take 1 tablet (5 mg total) by mouth at bedtime. 30 tablet 0  . ENSURE (ENSURE) Take 237 mLs by mouth 2 (two) times daily.     . ferrous sulfate 325 (65 FE) MG tablet Take 1 tablet (325 mg total) by mouth every other day. (Patient taking differently: Take 325 mg by mouth every other day. ) 30 tablet 3  . furosemide (LASIX) 80 MG tablet TAKE 1 TABLET BY MOUTH TWICE A DAY FOR EDEMA/HIGH BP 60 tablet 3  . HYDROcodone-acetaminophen (NORCO) 7.5-325 MG tablet Take 1 tablet by mouth every 6 (six) hours as needed for moderate pain. 30 tablet 0  . latanoprost (XALATAN) 0.005 % ophthalmic solution Place 1 drop into both eyes at bedtime.    Marland Kitchen LORazepam (ATIVAN) 1 MG tablet Take 1 mg by mouth at bedtime as needed.  2  . macitentan (OPSUMIT) 10 MG tablet Take 1 tablet (10 mg total) by mouth daily. 30 tablet 11  . megestrol (MEGACE  ES) 625 MG/5ML suspension Take 5 mLs (625 mg total) by mouth daily. (Patient not taking: Reported on 12/19/2017) 150 mL 3  . Melatonin 3 MG TABS Take 9 mg by mouth.    . oxybutynin (DITROPAN) 5 MG tablet Take  5 mg by mouth 3 (three) times daily.    . polyethylene glycol (MIRALAX / GLYCOLAX) packet Take 17 g by mouth daily. 14 each 0  . potassium chloride SA (K-DUR,KLOR-CON) 20 MEQ tablet Take 1 tablet (20 mEq total) by mouth daily. 90 tablet 3  . sertraline (ZOLOFT) 25 MG tablet Take 1 tablet (25 mg total) by mouth daily. 30 tablet 0  . tadalafil, PAH, (ADCIRCA) 20 MG tablet Take 2 tablets (40 mg total) by mouth daily. 60 tablet 11  . traZODone (DESYREL) 100 MG tablet TAKE 1 TABLET BY MOUTH EVERYDAY AT BEDTIME 90 tablet 0  . VENTOLIN HFA 108 (90 Base) MCG/ACT inhaler TAKE 2 PUFFS BY MOUTH EVERY 6 HOURS AS NEEDED FOR WHEEZE OR SHORTNESS OF BREATH 1 Inhaler 2  . warfarin (COUMADIN) 4 MG tablet TAKE 1/2 TO 1 TABLET BY MOUTH DAILY AS DIRECTED BY COUMADIN CLINIC 30 tablet 2   No current facility-administered medications for this visit.     Social History   Socioeconomic History  . Marital status: Single    Spouse name: Not on file  . Number of children: 0  . Years of education: 13  . Highest education level: Some college, no degree  Occupational History  . Occupation: retired from [employer]  Social Needs  . Financial resource strain: Not on file  . Food insecurity:    Worry: Not on file    Inability: Not on file  . Transportation needs:    Medical: Not on file    Non-medical: Not on file  Tobacco Use  . Smoking status: Former Smoker    Packs/day: 0.00    Years: 30.00    Pack years: 0.00    Types: Cigarettes    Last attempt to quit: 04/21/1975    Years since quitting: 42.7  . Smokeless tobacco: Never Used  Substance and Sexual Activity  . Alcohol use: Not Currently    Comment: occasionally  . Drug use: No  . Sexual activity: Not Currently  Lifestyle  . Physical  activity:    Days per week: Not on file    Minutes per session: Not on file  . Stress: Not on file  Relationships  . Social connections:    Talks on phone: Not on file    Gets together: Not on file    Attends religious service: Not on file    Active member of club or organization: Not on file    Attends meetings of clubs or organizations: Not on file    Relationship status: Not on file  . Intimate partner violence:    Fear of current or ex partner: Not on file    Emotionally abused: Not on file    Physically abused: Not on file    Forced sexual activity: Not on file  Other Topics Concern  . Not on file  Social History Narrative   Lives alone.  Has a dog.  No children.  Retired from [employer].  Education: some college.     Family History  Problem Relation Age of Onset  . Heart disease Mother   . Stroke Mother   . Hypertension Mother   . Stroke Father   . Hypertension Father   . Stroke Brother   . Heart attack Neg Hx   . Neuropathy Neg Hx     ROS General: Negative; No fevers, chills, or night sweats HEENT: Negative; No changes in vision or hearing, sinus congestion, difficulty swallowing Pulmonary:  Negative; No cough, wheezing, shortness of breath, hemoptysis Cardiovascular: See HPI:  GI: Negative; No nausea, vomiting, diarrhea, or abdominal pain GU: Negative; No dysuria, hematuria, or difficulty voiding Musculoskeletal: Negative; no myalgias, joint pain, or weakness Hematologic: Negative; no easy bruising, bleeding Endocrine: Negative; no heat/cold intolerance; no diabetes, Neuro: Negative; no changes in balance, headaches Skin: Negative; No rashes or skin lesions Psychiatric: Negative; No behavioral problems, depression Sleep: Negative; No snoring,  daytime sleepiness, hypersomnolence, bruxism, restless legs, hypnogognic hallucinations. Other comprehensive 14 point system review is negative   Physical Exam BP (!) 118/52   Pulse 62   Ht 5' 6"   (1.676 m)   Wt 132 lb 6.4 oz (60.1 kg)   BMI 21.37 kg/m    Repeat blood pressure by me was 120/64  Wt Readings from Last 3 Encounters:  12/21/17 132 lb 6.4 oz (60.1 kg)  12/19/17 132 lb (59.9 kg)  12/19/17 132 lb (59.9 kg)   General: Alert, oriented, no distress.  Skin: normal turgor, no rashes, warm and dry HEENT: Normocephalic, atraumatic. Pupils equal round and reactive to light; sclera anicteric; extraocular muscles intact;  Nose without nasal septal hypertrophy Mouth/Parynx benign; Mallinpatti scale 2 Neck: No JVD, no carotid bruits; normal carotid upstroke Lungs: clear to ausculatation and percussion; no wheezing or rales Chest wall: without tenderness to palpitation Heart: PMI not displaced, RRR, s1 s2 normal, 2/6 systolic murmur, no diastolic murmur, no rubs, gallops, thrills, or heaves Abdomen: soft, nontender; no hepatosplenomehaly, BS+; abdominal aorta nontender and not dilated by palpation. Back: no CVA tenderness Pulses 2+ Musculoskeletal: full range of motion, normal strength, no joint deformities Extremities: no clubbing cyanosis or edema, Homan's sign negative  Neurologic: grossly nonfocal; Cranial nerves grossly wnl Psychologic: Normal mood and affect   ECG (independently read by me): Ventricular paced rhythm at 62 bpm with 100% capture. Underlying atrial fibrillation.  May 2019 ECG (independently read by me): Atrially paced rhythm with a PR prolongation at 288 ms.  QTc interval 498 ms.  November 2018 ECG (independently read by me): Atrially paced rhythm at 60 bpm.  Prolonged AV conduction with PR interval 266 ms.  QTc interval increased at 508 ms.  March 2018 ECG (independently read by me): Atrially paced rhythm at 60 bpm.  Prolonged AV conduction with PR interval 286 ms.  Nonspecific ST-T changes.  QTc interval increased at 522 ms.  January 2018 ECG (independently read by me): Atrially paced rhythm at 60 bpm.  Prolonged AV conduction with a PR interval at 270  ms.  Nonspecific ST-T changes.  March 2017 ECG (independently read by me): Atrial flutter with a ventricular rate at 87 bpm with variable block.  Nonspecific ST-T changes.  My personal review of her March 2016 ECG shows sinus bradycardia at 58  LABS:  BMP Latest Ref Rng & Units 12/09/2017 10/20/2017 08/15/2017  Glucose 70 - 99 mg/dL 93 100(H) 102(H)  BUN 8 - 23 mg/dL 44(H) 38(H) 17  Creatinine 0.44 - 1.00 mg/dL 1.94(H) 1.73(H) 1.68(H)  Sodium 135 - 145 mmol/L 140 138 138  Potassium 3.5 - 5.1 mmol/L 4.6 4.2 3.2(L)  Chloride 98 - 111 mmol/L 103 102 102  CO2 22 - 32 mmol/L 26 24 27   Calcium 8.9 - 10.3 mg/dL 9.3 9.2 9.3    Hepatic Function Latest Ref Rng & Units 12/09/2017 10/20/2017 07/01/2017  Total Protein 6.5 - 8.1 g/dL 7.5 7.2 6.9  Albumin 3.5 - 5.0 g/dL 3.8 3.9 3.6  AST 15 - 41 U/L 26 24 31  ALT 0 - 44 U/L 13 11 13(L)  Alk Phosphatase 38 - 126 U/L 81 72 90  Total Bilirubin 0.3 - 1.2 mg/dL 0.7 0.9 1.0  Bilirubin, Direct 0.1 - 0.5 mg/dL - - -    CBC Latest Ref Rng & Units 10/20/2017 06/20/2017 06/19/2017  WBC 4.0 - 10.5 K/uL 4.2 4.3 3.8(L)  Hemoglobin 12.0 - 15.0 g/dL 9.8(L) 9.5(L) 8.8(L)  Hematocrit 36.0 - 46.0 % 32.0(L) 30.5(L) 28.1(L)  Platelets 150 - 400 K/uL 235 240 223   Lab Results  Component Value Date   MCV 84.4 10/20/2017   MCV 82.9 06/20/2017   MCV 83.1 06/19/2017    Lab Results  Component Value Date   TSH 0.662 10/20/2017    BNP    Component Value Date/Time   BNP 553.0 (H) 06/13/2017 0915   BNP 386.4 (H) 03/08/2016 1024    ProBNP    Component Value Date/Time   PROBNP 4,311 (H) 01/17/2017 1438   PROBNP 3,143.0 (H) 11/17/2013 0442    Lipid Panel  No results found for: CHOL, TRIG, HDL, CHOLHDL, VLDL, LDLCALC, LDLDIRECT  INR 5.2 today; she was advised on adjustment to her warfarin dosage  RADIOLOGY: No results found.  IMPRESSION:  1. Pulmonary hypertension ([place])   2. Chronic diastolic CHF (congestive heart failure) ([facility])   3. Persistent atrial  fibrillation ([place])   4. Sick sinus syndrome ([place])   5. Familial transthyretin amyloidosis ([facility])   6. Long term current use of anticoagulant therapy   7. Chronic kidney disease (CKD), stage IV (severe) ([facility])    ASSESSMENT AND PLAN: Ms. Emrich is an 84 year-old African-American female who has a history of atrial flutter and PAF and underwent insertion of a permanent pacemaker for sick sinus syndrome in May 2017.  She has a history of hypertension, chronic diastolic heart failure and was found to develop progressive pulmonary hypertension leading to referral to Dr. Aundra Dubin.  Right heart catheterization showed severe pulmonary arterial hypertension and mildly elevated pulmonary capillary wedge pressure.  PVR was 5.4.  She was felt most likely to have Group 1 pulmonary arterial hypertension and does not have significant  parenchymal lung disease.  A VQ scan showed no chronic PEs and PFT showed minimal obstructive/restrition.  With severe LVH, she was felt possibly to have amyloidosis.  Subsequent work-up revealed a technician pyrophosphate study that was suggestive of transthyretin amyloid.  This was confirmed on genetic testing with the Val142lle mutation.  She is being considered for initiation of tafamidis but this has not yet been initiated and has been approved but she is awaiting patient assistance for initiation of therapy current therapy.  Presently, her blood pressure is stable and on repeat by me was 120/64 on carvedilol 3.125 mg twice a day in addition to her furosemide.  She appears to be euvolemic on exam.  She continues to be on Adcirca and OPSUMIT for pulmonary hypertension.  She is on warfarin for anticoagulation.  INR today was 3.4.  She has underlying atrial fibrillation and recently saw Dr. Curt Bears.  She will be undergoing weekly protimes and there is plans to undergo cardioversion for her atrial fibrillation.  She is not having any chest pain.  She is followed very closely by Dr. Aundra Dubin.  Most  recent serum creatinine was increased at 1.9 from her last visit with Dr. Aundra Dubin.  Since she will be seeing him next week I will not make adjustments depending upon renal function she may require adjustment of her furosemide.  I will  see her in 6 months for reevaluation.  Time spent: 25 minutes Troy Sine, MD, [facility]  12/25/2017 12:20 PM

## 2017-12-25 ENCOUNTER — Encounter: Payer: Self-pay | Admitting: Cardiovascular Disease

## 2017-12-26 ENCOUNTER — Other Ambulatory Visit (HOSPITAL_COMMUNITY): Payer: Self-pay

## 2017-12-26 NOTE — Progress Notes (Signed)
Paramedicine Encounter    Patient ID: Natasha Fletcher, female    DOB: 1926-02-09, 84 y.o.   MRN: [id number]   Patient Care Team: Lorene Dy, MD as PCP - General (Internal Medicine) Constance Haw, MD as PCP - Electrophysiology (Cardiology) Troy Sine, MD as PCP - Cardiology (Cardiology)  Patient Active Problem List   Diagnosis Date Noted  . Cognitive decline   . Palliative care by specialist   . DNR (do not resuscitate) discussion   . Acute on chronic congestive heart failure ([place])   . Acute on chronic diastolic CHF (congestive heart failure) ([place])   . Sick sinus syndrome due to SA node dysfunction (HCC)   . Shortness of breath 06/13/2017  . Atrial fibrillation ([place]) 03/22/2016  . Bradycardia 08/22/2015  . Junctional (nodal) bradycardia 08/22/2015  . Malnutrition of moderate degree 03/19/2015  . Benign essential HTN   . Bradycardia, drug induced 03/18/2015  . Hypokalemia 02/16/2015  . Thrombocytopenia ([place]) 02/16/2015  . Sepsis due to urinary tract infection ([place])   . Sepsis ([place]) 02/12/2015  . Urinary tract infectious disease   . Long-term (current) use of anticoagulants 07/12/2014  . Chronic anticoagulation 02/11/2014  . Hypertensive cardiovascular disease 01/24/2014  . Chronic diastolic CHF (congestive heart failure) ([place]) 01/24/2014  . Anemia 01/24/2014  . Breast cancer of upper-inner quadrant of right female breast ([place]) 01/24/2014  . Acute diastolic heart failure ([place]) 11/11/2013  . Atrial flutter ([place]) 11/11/2013  . PAF (paroxysmal atrial fibrillation) ([place]) 11/10/2013  . Slurred speech 11/10/2013  . Protein-calorie malnutrition, severe ([place]) 11/06/2013  . Coffee ground emesis 11/05/2013  . Dehydration 10/19/2013  . Urinary retention 10/18/2013  . GERD (gastroesophageal reflux disease) 03/20/2013  . S/P total knee arthroplasty   . UTI (urinary tract infection) 02/21/2013  . OA (osteoarthritis) of knee 02/19/2013  . Hypertension   . Thyroid disease    . Cancer of central portion of female breast ([place]) 04/14/2011    Current Outpatient Medications:  .  amiodarone (PACERONE) 200 MG tablet, Take 0.5 tablets (100 mg total) by mouth daily., Disp: 45 tablet, Rfl: 2 .  carvedilol (COREG) 3.125 MG tablet, TAKE 1 TABLET (3.125 MG TOTAL) BY MOUTH 2 (TWO) TIMES DAILY WITH A MEAL., Disp: 60 tablet, Rfl: 3 .  donepezil (ARICEPT) 5 MG tablet, Take 1 tablet (5 mg total) by mouth at bedtime., Disp: 30 tablet, Rfl: 0 .  ENSURE (ENSURE), Take 237 mLs by mouth 2 (two) times daily. , Disp: , Rfl:  .  ferrous sulfate 325 (65 FE) MG tablet, Take 1 tablet (325 mg total) by mouth every other day. (Patient taking differently: Take 325 mg by mouth every other day. ), Disp: 30 tablet, Rfl: 3 .  furosemide (LASIX) 80 MG tablet, TAKE 1 TABLET BY MOUTH TWICE A DAY FOR EDEMA/HIGH BP, Disp: 60 tablet, Rfl: 3 .  HYDROcodone-acetaminophen (NORCO) 7.5-325 MG tablet, Take 1 tablet by mouth every 6 (six) hours as needed for moderate pain., Disp: 30 tablet, Rfl: 0 .  latanoprost (XALATAN) 0.005 % ophthalmic solution, Place 1 drop into both eyes at bedtime., Disp: , Rfl:  .  macitentan (OPSUMIT) 10 MG tablet, Take 1 tablet (10 mg total) by mouth daily., Disp: 30 tablet, Rfl: 11 .  Melatonin 3 MG TABS, Take 9 mg by mouth., Disp: , Rfl:  .  oxybutynin (DITROPAN) 5 MG tablet, Take 5 mg by mouth 3 (three) times daily. , Disp: , Rfl:  .  polyethylene glycol (MIRALAX / GLYCOLAX) packet,  Take 17 g by mouth daily., Disp: 14 each, Rfl: 0 .  potassium chloride SA (K-DUR,KLOR-CON) 20 MEQ tablet, Take 1 tablet (20 mEq total) by mouth daily., Disp: 90 tablet, Rfl: 3 .  sertraline (ZOLOFT) 25 MG tablet, Take 1 tablet (25 mg total) by mouth daily., Disp: 30 tablet, Rfl: 0 .  tadalafil, PAH, (ADCIRCA) 20 MG tablet, Take 2 tablets (40 mg total) by mouth daily., Disp: 60 tablet, Rfl: 11 .  traZODone (DESYREL) 100 MG tablet, TAKE 1 TABLET BY MOUTH EVERYDAY AT BEDTIME, Disp: 90 tablet, Rfl: 0 .   VENTOLIN HFA 108 (90 Base) MCG/ACT inhaler, TAKE 2 PUFFS BY MOUTH EVERY 6 HOURS AS NEEDED FOR WHEEZE OR SHORTNESS OF BREATH, Disp: 1 Inhaler, Rfl: 2 .  warfarin (COUMADIN) 4 MG tablet, TAKE 1/2 TO 1 TABLET BY MOUTH DAILY AS DIRECTED BY COUMADIN CLINIC, Disp: 30 tablet, Rfl: 2 .  LORazepam (ATIVAN) 1 MG tablet, Take 1 mg by mouth at bedtime as needed., Disp: , Rfl: 2 .  megestrol (MEGACE ES) 625 MG/5ML suspension, Take 5 mLs (625 mg total) by mouth daily. (Patient not taking: Reported on 12/19/2017), Disp: 150 mL, Rfl: 3 Allergies  Allergen Reactions  . Lactose Intolerance (Gi) Other (See Comments)    Stomach pain  . Milk-Related Compounds Other (See Comments)    Upset stomach   . Sulfa Antibiotics Itching      Social History   Socioeconomic History  . Marital status: Single    Spouse name: Not on file  . Number of children: 0  . Years of education: 24  . Highest education level: Some college, no degree  Occupational History  . Occupation: retired from [employer]  Social Needs  . Financial resource strain: Not on file  . Food insecurity:    Worry: Not on file    Inability: Not on file  . Transportation needs:    Medical: Not on file    Non-medical: Not on file  Tobacco Use  . Smoking status: Former Smoker    Packs/day: 0.00    Years: 30.00    Pack years: 0.00    Types: Cigarettes    Last attempt to quit: 04/21/1975    Years since quitting: 42.7  . Smokeless tobacco: Never Used  Substance and Sexual Activity  . Alcohol use: Not Currently    Comment: occasionally  . Drug use: No  . Sexual activity: Not Currently  Lifestyle  . Physical activity:    Days per week: Not on file    Minutes per session: Not on file  . Stress: Not on file  Relationships  . Social connections:    Talks on phone: Not on file    Gets together: Not on file    Attends religious service: Not on file    Active member of club or organization: Not on file    Attends meetings of  clubs or organizations: Not on file    Relationship status: Not on file  . Intimate partner violence:    Fear of current or ex partner: Not on file    Emotionally abused: Not on file    Physically abused: Not on file    Forced sexual activity: Not on file  Other Topics Concern  . Not on file  Social History Narrative   Lives alone.  Has a dog.  No children.  Retired from [employer].  Education: some college.     Physical Exam  Future Appointments  Date Time Provider [place]  12/27/2017  8:30 AM [facility]  01/03/2018  8:30 AM [facility]  01/24/2018  9:40 AM Larey Dresser, MD [facility] None  02/01/2018  9:30 AM Narda Amber K, DO [facility] None  03/21/2018  7:40 AM [facility] DEVICE REMOTES [facility]    BP 116/62   Pulse 82   Resp 15   Wt 129 lb (58.5 kg)   SpO2 97%   BMI 20.82 kg/m   Weight yesterday-129  Last visit weight-132  Pt reports she isnt doing well today, she has intermittent episodes of sob.  She is awaiting the enrollment form for the vydaquel assistance.  meds verified and pill box refilled.  Her insurance will not approve the megace, she struggles with her appetite. I advised her to eat when she can. She also is not having regular BM. I advised she could take a daily stool softener. Lungs clear. No swelling noted. Her legs are painful to touch.  She is still waiting to be sch for cardioversion.  She is sch to coumadin clinic tomor and if there are any changes from that I will come back to make those adjustments.   Marylouise Stacks, [place] [facility] Paramedic  12/26/17

## 2017-12-27 ENCOUNTER — Ambulatory Visit: Payer: Medicare HMO | Admitting: Pharmacist Clinician (PhC)/ Clinical Pharmacy Specialist

## 2017-12-27 DIAGNOSIS — Z7901 Long term (current) use of anticoagulants: Secondary | ICD-10-CM | POA: Diagnosis not present

## 2017-12-27 DIAGNOSIS — I480 Paroxysmal atrial fibrillation: Secondary | ICD-10-CM

## 2017-12-27 LAB — POCT INR: INR: 2.9 (ref 2.0–3.0)

## 2018-01-01 DIAGNOSIS — R0602 Shortness of breath: Secondary | ICD-10-CM | POA: Diagnosis not present

## 2018-01-01 DIAGNOSIS — A419 Sepsis, unspecified organism: Secondary | ICD-10-CM | POA: Diagnosis not present

## 2018-01-01 DIAGNOSIS — Z9181 History of falling: Secondary | ICD-10-CM | POA: Diagnosis not present

## 2018-01-01 DIAGNOSIS — M2550 Pain in unspecified joint: Secondary | ICD-10-CM | POA: Diagnosis not present

## 2018-01-01 DIAGNOSIS — R5381 Other malaise: Secondary | ICD-10-CM | POA: Diagnosis not present

## 2018-01-01 DIAGNOSIS — Z5189 Encounter for other specified aftercare: Secondary | ICD-10-CM | POA: Diagnosis not present

## 2018-01-01 DIAGNOSIS — I5022 Chronic systolic (congestive) heart failure: Secondary | ICD-10-CM | POA: Diagnosis not present

## 2018-01-01 DIAGNOSIS — I5032 Chronic diastolic (congestive) heart failure: Secondary | ICD-10-CM | POA: Diagnosis not present

## 2018-01-01 DIAGNOSIS — S83509A Sprain of unspecified cruciate ligament of unspecified knee, initial encounter: Secondary | ICD-10-CM | POA: Diagnosis not present

## 2018-01-01 DIAGNOSIS — R269 Unspecified abnormalities of gait and mobility: Secondary | ICD-10-CM | POA: Diagnosis not present

## 2018-01-02 ENCOUNTER — Other Ambulatory Visit (HOSPITAL_COMMUNITY): Payer: Self-pay

## 2018-01-02 NOTE — Progress Notes (Signed)
Paramedicine Encounter    Patient ID: Natasha Fletcher, female    DOB: [birth year], 84 y.o.   MRN: [id number]   Patient Care Team: Lorene Dy, MD as PCP - General (Internal Medicine) Constance Haw, MD as PCP - Electrophysiology (Cardiology) Troy Sine, MD as PCP - Cardiology (Cardiology)  Patient Active Problem List   Diagnosis Date Noted  . Cognitive decline   . Palliative care by specialist   . DNR (do not resuscitate) discussion   . Acute on chronic congestive heart failure ([place])   . Acute on chronic diastolic CHF (congestive heart failure) ([place])   . Sick sinus syndrome due to SA node dysfunction (HCC)   . Shortness of breath 06/13/2017  . Atrial fibrillation ([place]) 03/22/2016  . Bradycardia 08/22/2015  . Junctional (nodal) bradycardia 08/22/2015  . Malnutrition of moderate degree 03/19/2015  . Benign essential HTN   . Bradycardia, drug induced 03/18/2015  . Hypokalemia 02/16/2015  . Thrombocytopenia ([place]) 02/16/2015  . Sepsis due to urinary tract infection ([place])   . Sepsis ([place]) 02/12/2015  . Urinary tract infectious disease   . Long-term (current) use of anticoagulants 07/12/2014  . Chronic anticoagulation 02/11/2014  . Hypertensive cardiovascular disease 01/24/2014  . Chronic diastolic CHF (congestive heart failure) ([place]) 01/24/2014  . Anemia 01/24/2014  . Breast cancer of upper-inner quadrant of right female breast ([place]) 01/24/2014  . Acute diastolic heart failure ([place]) 11/11/2013  . Atrial flutter ([place]) 11/11/2013  . PAF (paroxysmal atrial fibrillation) ([place]) 11/10/2013  . Slurred speech 11/10/2013  . Protein-calorie malnutrition, severe ([place]) 11/06/2013  . Coffee ground emesis 11/05/2013  . Dehydration 10/19/2013  . Urinary retention 10/18/2013  . GERD (gastroesophageal reflux disease) 03/20/2013  . S/P total knee arthroplasty   . UTI (urinary tract infection) 02/21/2013  . OA (osteoarthritis) of knee 02/19/2013  . Hypertension   . Thyroid disease    . Cancer of central portion of female breast ([place]) 04/14/2011    Current Outpatient Medications:  .  amiodarone (PACERONE) 200 MG tablet, Take 0.5 tablets (100 mg total) by mouth daily., Disp: 45 tablet, Rfl: 2 .  carvedilol (COREG) 3.125 MG tablet, TAKE 1 TABLET (3.125 MG TOTAL) BY MOUTH 2 (TWO) TIMES DAILY WITH A MEAL., Disp: 60 tablet, Rfl: 3 .  donepezil (ARICEPT) 5 MG tablet, Take 1 tablet (5 mg total) by mouth at bedtime., Disp: 30 tablet, Rfl: 0 .  ENSURE (ENSURE), Take 237 mLs by mouth 2 (two) times daily. , Disp: , Rfl:  .  ferrous sulfate 325 (65 FE) MG tablet, Take 1 tablet (325 mg total) by mouth every other day. (Patient taking differently: Take 325 mg by mouth every other day. ), Disp: 30 tablet, Rfl: 3 .  furosemide (LASIX) 80 MG tablet, TAKE 1 TABLET BY MOUTH TWICE A DAY FOR EDEMA/HIGH BP, Disp: 60 tablet, Rfl: 3 .  HYDROcodone-acetaminophen (NORCO) 7.5-325 MG tablet, Take 1 tablet by mouth every 6 (six) hours as needed for moderate pain., Disp: 30 tablet, Rfl: 0 .  latanoprost (XALATAN) 0.005 % ophthalmic solution, Place 1 drop into both eyes at bedtime., Disp: , Rfl:  .  macitentan (OPSUMIT) 10 MG tablet, Take 1 tablet (10 mg total) by mouth daily., Disp: 30 tablet, Rfl: 11 .  Melatonin 3 MG TABS, Take 9 mg by mouth., Disp: , Rfl:  .  oxybutynin (DITROPAN) 5 MG tablet, Take 5 mg by mouth 3 (three) times daily. , Disp: , Rfl:  .  polyethylene glycol (MIRALAX / GLYCOLAX) packet,  Take 17 g by mouth daily., Disp: 14 each, Rfl: 0 .  potassium chloride SA (K-DUR,KLOR-CON) 20 MEQ tablet, Take 1 tablet (20 mEq total) by mouth daily., Disp: 90 tablet, Rfl: 3 .  sertraline (ZOLOFT) 25 MG tablet, Take 1 tablet (25 mg total) by mouth daily., Disp: 30 tablet, Rfl: 0 .  tadalafil, PAH, (ADCIRCA) 20 MG tablet, Take 2 tablets (40 mg total) by mouth daily., Disp: 60 tablet, Rfl: 11 .  traZODone (DESYREL) 100 MG tablet, TAKE 1 TABLET BY MOUTH EVERYDAY AT BEDTIME, Disp: 90 tablet, Rfl: 0 .   VENTOLIN HFA 108 (90 Base) MCG/ACT inhaler, TAKE 2 PUFFS BY MOUTH EVERY 6 HOURS AS NEEDED FOR WHEEZE OR SHORTNESS OF BREATH, Disp: 1 Inhaler, Rfl: 2 .  warfarin (COUMADIN) 4 MG tablet, TAKE 1/2 TO 1 TABLET BY MOUTH DAILY AS DIRECTED BY COUMADIN CLINIC, Disp: 30 tablet, Rfl: 2 .  LORazepam (ATIVAN) 1 MG tablet, Take 1 mg by mouth at bedtime as needed., Disp: , Rfl: 2 .  megestrol (MEGACE ES) 625 MG/5ML suspension, Take 5 mLs (625 mg total) by mouth daily. (Patient not taking: Reported on 12/19/2017), Disp: 150 mL, Rfl: 3 Allergies  Allergen Reactions  . Lactose Intolerance (Gi) Other (See Comments)    Stomach pain  . Milk-Related Compounds Other (See Comments)    Upset stomach   . Sulfa Antibiotics Itching      Social History   Socioeconomic History  . Marital status: Single    Spouse name: Not on file  . Number of children: 0  . Years of education: 68  . Highest education level: Some college, no degree  Occupational History  . Occupation: retired from [employer]  Social Needs  . Financial resource strain: Not on file  . Food insecurity:    Worry: Not on file    Inability: Not on file  . Transportation needs:    Medical: Not on file    Non-medical: Not on file  Tobacco Use  . Smoking status: Former Smoker    Packs/day: 0.00    Years: 30.00    Pack years: 0.00    Types: Cigarettes    Last attempt to quit: 04/21/1975    Years since quitting: 42.7  . Smokeless tobacco: Never Used  Substance and Sexual Activity  . Alcohol use: Not Currently    Comment: occasionally  . Drug use: No  . Sexual activity: Not Currently  Lifestyle  . Physical activity:    Days per week: Not on file    Minutes per session: Not on file  . Stress: Not on file  Relationships  . Social connections:    Talks on phone: Not on file    Gets together: Not on file    Attends religious service: Not on file    Active member of club or organization: Not on file    Attends meetings of  clubs or organizations: Not on file    Relationship status: Not on file  . Intimate partner violence:    Fear of current or ex partner: Not on file    Emotionally abused: Not on file    Physically abused: Not on file    Forced sexual activity: Not on file  Other Topics Concern  . Not on file  Social History Narrative   Lives alone.  Has a dog.  No children.  Retired from [employer].  Education: some college.     Physical Exam  Future Appointments  Date Time Provider [place]  01/03/2018  8:30 AM [facility]  01/11/2018  8:30 AM [facility]  01/24/2018  9:40 AM Larey Dresser, MD [facility] None  02/01/2018  9:30 AM Narda Amber K, DO [facility] None  03/21/2018  7:40 AM [facility] DEVICE REMOTES [facility]    BP 102/64   Pulse 64   Resp 15   Wt 129 lb (58.5 kg)   SpO2 99%   BMI 20.82 kg/m   Weight yesterday-129  Last visit weight-129    Pt reports she is not feeling well today. Her breathing is short with mild exertion and she is having a lot of pain to her legs and back.  She did take her pain pill this morning, seemed to subside the pain some.  Pt reports social worker with the [facility] was suppose to be helping her get services with veteran service but he has yet to call her back and reports he was going to make home visit but has not done so.  meds verified and pill box refilled.  Ordered her opsumit also-should be delivered on Friday 10/4.  Still poor appetite. Advised her to eat when she can and she is drinking ensure about 2-3 a day.   She has not been scheduled for cardioversion yet.  She goes to coumadin clinic tomor and I will come back if needed.   Marylouise Stacks, [facility] Paramedic  01/02/18

## 2018-01-03 ENCOUNTER — Telehealth (HOSPITAL_COMMUNITY): Payer: Self-pay

## 2018-01-03 ENCOUNTER — Ambulatory Visit: Payer: Medicare HMO | Admitting: Pharmacist Clinician (PhC)/ Clinical Pharmacy Specialist

## 2018-01-03 DIAGNOSIS — I480 Paroxysmal atrial fibrillation: Secondary | ICD-10-CM | POA: Diagnosis not present

## 2018-01-03 DIAGNOSIS — Z7901 Long term (current) use of anticoagulants: Secondary | ICD-10-CM | POA: Diagnosis not present

## 2018-01-03 LAB — POCT INR: INR: 3.4 — AB (ref 2.0–3.0)

## 2018-01-03 NOTE — Telephone Encounter (Signed)
Pt called me stating there was a change in her coumadin dosing but she couldn't find the sheet she was given from the doc office. Nobody called me letting me know of any changes from the coumadin clinic today.  Per clinic orders she is to not take her dose tonight and then half tab daily except full tab on fridays.  I told pt I would come right over and make that change in pill box, pt told me that she could take it out and it was the light blue half tab and she is correct and she would handle it. I confirmed again with pt that she could remove tab of med and I would be more than happy to come out but she refused and I will see her again on Monday.   Marylouise Stacks, EMT-Paramedic  01/03/18

## 2018-01-05 ENCOUNTER — Telehealth: Payer: Self-pay | Admitting: *Deleted

## 2018-01-05 DIAGNOSIS — Z01812 Encounter for preprocedural laboratory examination: Secondary | ICD-10-CM

## 2018-01-05 DIAGNOSIS — I4819 Other persistent atrial fibrillation: Secondary | ICD-10-CM

## 2018-01-05 NOTE — Telephone Encounter (Signed)
Informed pt & brother, [name], that we are working on getting this scheduled for next Friday.  He understands I will call them Monday to discuss/review further.

## 2018-01-05 NOTE — Telephone Encounter (Signed)
Follow up    Patients brother is returning call in reference to scheduling procedures. Please call.

## 2018-01-05 NOTE — Telephone Encounter (Signed)
Lm for brother to return my call to arrange DCCV Pt knows I will discuss instructions once procedure is scheduled.

## 2018-01-05 NOTE — Telephone Encounter (Signed)
Spoke to pt's brother. He understands I will arrange and call him back w/ details.  He is agreeable to plan.

## 2018-01-09 ENCOUNTER — Other Ambulatory Visit (HOSPITAL_COMMUNITY): Payer: Self-pay

## 2018-01-09 NOTE — Telephone Encounter (Signed)
Spoke with pt and [name], brother. Advised DCCV scheduled for Friday, 10/11. Pt has INR scheduled for 10/9.  Will have her get pre procedure blood work same day.  Will forward info to [facility]in clinic so they can make sure she gets blood work completed before leaving that appt. Follow up w/ [place] scheduled for 12/4. DCCV instructions reviewed. Pt and brother verbalized understanding.

## 2018-01-09 NOTE — Progress Notes (Signed)
Paramedicine Encounter    Patient ID: Natasha Fletcher, female    DOB: [birth year], 84 y.o.   MRN: [id number]   Patient Care Team: Lorene Dy, MD as PCP - General (Internal Medicine) Constance Haw, MD as PCP - Electrophysiology (Cardiology) Troy Sine, MD as PCP - Cardiology (Cardiology)  Patient Active Problem List   Diagnosis Date Noted  . Cognitive decline   . Palliative care by specialist   . DNR (do not resuscitate) discussion   . Acute on chronic congestive heart failure ([place])   . Acute on chronic diastolic CHF (congestive heart failure) ([place])   . Sick sinus syndrome due to SA node dysfunction (H[facility])   . Shortness of breath 06/13/2017  . Atrial fibrillation ([place]) 03/22/2016  . Bradycardia 08/22/2015  . Junctional (nodal) bradycardia 08/22/2015  . Malnutrition of moderate degree 03/19/2015  . Benign essential HTN   . Bradycardia, drug induced 03/18/2015  . Hypokalemia 02/16/2015  . Thrombocytopenia ([place]) 02/16/2015  . Sepsis due to urinary tract infection ([place])   . Sepsis ([place]) 02/12/2015  . Urinary tract infectious disease   . Long-term (current) use of anticoagulants 07/12/2014  . Chronic anticoagulation 02/11/2014  . Hypertensive cardiovascular disease 01/24/2014  . Chronic diastolic CHF (congestive heart failure) ([place]) 01/24/2014  . Anemia 01/24/2014  . Breast cancer of upper-inner quadrant of right female breast ([place]) 01/24/2014  . Acute diastolic heart failure ([place]) 11/11/2013  . Atrial flutter ([place]) 11/11/2013  . PAF (paroxysmal atrial fibrillation) ([place]) 11/10/2013  . Slurred speech 11/10/2013  . Protein-calorie malnutrition, severe ([place]) 11/06/2013  . Coffee ground emesis 11/05/2013  . Dehydration 10/19/2013  . Urinary retention 10/18/2013  . GERD (gastroesophageal reflux disease) 03/20/2013  . S/P total knee arthroplasty   . UTI (urinary tract infection) 02/21/2013  . OA (osteoarthritis) of knee 02/19/2013  . Hypertension   . Thyroid disease    . Cancer of central portion of female breast ([place]) 04/14/2011    Current Outpatient Medications:  .  amiodarone (PACERONE) 200 MG tablet, Take 0.5 tablets (100 mg total) by mouth daily., Disp: 45 tablet, Rfl: 2 .  carvedilol (COREG) 3.125 MG tablet, TAKE 1 TABLET (3.125 MG TOTAL) BY MOUTH 2 (TWO) TIMES DAILY WITH A MEAL., Disp: 60 tablet, Rfl: 3 .  donepezil (ARICEPT) 5 MG tablet, Take 1 tablet (5 mg total) by mouth at bedtime., Disp: 30 tablet, Rfl: 0 .  ENSURE (ENSURE), Take 237 mLs by mouth 2 (two) times daily. , Disp: , Rfl:  .  ferrous sulfate 325 (65 FE) MG tablet, Take 1 tablet (325 mg total) by mouth every other day. (Patient taking differently: Take 325 mg by mouth every other day. ), Disp: 30 tablet, Rfl: 3 .  furosemide (LASIX) 80 MG tablet, TAKE 1 TABLET BY MOUTH TWICE A DAY FOR EDEMA/HIGH BP, Disp: 60 tablet, Rfl: 3 .  HYDROcodone-acetaminophen (NORCO) 7.5-325 MG tablet, Take 1 tablet by mouth every 6 (six) hours as needed for moderate pain., Disp: 30 tablet, Rfl: 0 .  latanoprost (XALATAN) 0.005 % ophthalmic solution, Place 1 drop into both eyes at bedtime., Disp: , Rfl:  .  macitentan (OPSUMIT) 10 MG tablet, Take 1 tablet (10 mg total) by mouth daily., Disp: 30 tablet, Rfl: 11 .  Melatonin 3 MG TABS, Take 9 mg by mouth., Disp: , Rfl:  .  oxybutynin (DITROPAN) 5 MG tablet, Take 5 mg by mouth 3 (three) times daily. , Disp: , Rfl:  .  polyethylene glycol (MIRALAX / GLYCOLAX) packet,  Take 17 g by mouth daily., Disp: 14 each, Rfl: 0 .  potassium chloride SA (K-DUR,KLOR-CON) 20 MEQ tablet, Take 1 tablet (20 mEq total) by mouth daily., Disp: 90 tablet, Rfl: 3 .  sertraline (ZOLOFT) 25 MG tablet, Take 1 tablet (25 mg total) by mouth daily., Disp: 30 tablet, Rfl: 0 .  tadalafil, PAH, (ADCIRCA) 20 MG tablet, Take 2 tablets (40 mg total) by mouth daily., Disp: 60 tablet, Rfl: 11 .  traZODone (DESYREL) 100 MG tablet, TAKE 1 TABLET BY MOUTH EVERYDAY AT BEDTIME, Disp: 90 tablet, Rfl: 0 .   VENTOLIN HFA 108 (90 Base) MCG/ACT inhaler, TAKE 2 PUFFS BY MOUTH EVERY 6 HOURS AS NEEDED FOR WHEEZE OR SHORTNESS OF BREATH, Disp: 1 Inhaler, Rfl: 2 .  warfarin (COUMADIN) 4 MG tablet, TAKE 1/2 TO 1 TABLET BY MOUTH DAILY AS DIRECTED BY COUMADIN CLINIC, Disp: 30 tablet, Rfl: 2 .  LORazepam (ATIVAN) 1 MG tablet, Take 1 mg by mouth at bedtime as needed., Disp: , Rfl: 2 .  megestrol (MEGACE ES) 625 MG/5ML suspension, Take 5 mLs (625 mg total) by mouth daily. (Patient not taking: Reported on 12/19/2017), Disp: 150 mL, Rfl: 3 Allergies  Allergen Reactions  . Lactose Intolerance (Gi) Other (See Comments)    Stomach pain  . Milk-Related Compounds Other (See Comments)    Upset stomach   . Sulfa Antibiotics Itching      Social History   Socioeconomic History  . Marital status: Single    Spouse name: Not on file  . Number of children: 0  . Years of education: 59  . Highest education level: Some college, no degree  Occupational History  . Occupation: retired from [employer]  Social Needs  . Financial resource strain: Not on file  . Food insecurity:    Worry: Not on file    Inability: Not on file  . Transportation needs:    Medical: Not on file    Non-medical: Not on file  Tobacco Use  . Smoking status: Former Smoker    Packs/day: 0.00    Years: 30.00    Pack years: 0.00    Types: Cigarettes    Last attempt to quit: 04/21/1975    Years since quitting: 42.7  . Smokeless tobacco: Never Used  Substance and Sexual Activity  . Alcohol use: Not Currently    Comment: occasionally  . Drug use: No  . Sexual activity: Not Currently  Lifestyle  . Physical activity:    Days per week: Not on file    Minutes per session: Not on file  . Stress: Not on file  Relationships  . Social connections:    Talks on phone: Not on file    Gets together: Not on file    Attends religious service: Not on file    Active member of club or organization: Not on file    Attends meetings of  clubs or organizations: Not on file    Relationship status: Not on file  . Intimate partner violence:    Fear of current or ex partner: Not on file    Emotionally abused: Not on file    Physically abused: Not on file    Forced sexual activity: Not on file  Other Topics Concern  . Not on file  Social History Narrative   Lives alone.  Has a dog.  No children.  Retired from [employer].  Education: some college.     Physical Exam  Future Appointments  Date Time Provider [place]  01/11/2018  8:30 AM [facility]  01/24/2018  9:40 AM Larey Dresser, MD [facility] None  02/01/2018  9:30 AM Narda Amber K, DO [facility] None  03/08/2018  8:45 AM Constance Haw, MD [facility]  03/21/2018  7:40 AM [facility] DEVICE REMOTES [facility]    BP 108/66   Pulse 66   Resp 15   Wt 129 lb (58.5 kg)   SpO2 98%   BMI 20.82 kg/m   Weight yesterday-129 Last visit weight-129  Pt reports she is not feeling well in the mornings and feels it is due to her not sleeping much during the night.  She lays down around 6pm to watch tv in the bed and then she falls asleep around 8pm and then wakes up around 1am and then she reports she doesn't even go back to sleep any.  She is already taking 9mg  of melatonin nightly and it isnt working.  Pt still has her intermittent increased sob, she tries to do a lot around her apt and gets sob easily.  She wears her 02 continuously.  meds verified and pill box refilled.    Natasha Fletcher, [facility] Paramedic  01/09/18

## 2018-01-10 ENCOUNTER — Telehealth: Payer: Self-pay | Admitting: Cardiovascular Disease

## 2018-01-10 NOTE — Telephone Encounter (Signed)
New Message:    Patient would like someone to call her about a surgry she is having on Friday. Patient states she do not understand. I also spoke with her bother. Please call Patient.

## 2018-01-10 NOTE — Telephone Encounter (Signed)
Called pt back but she was upset and would only talk with Trinidad Curet RN.Marland Kitchen She said she was confused about her ?cardioversion this week and would feel better if she could talk with Sherri... Advised pt that she was in clinic but will  forward the message to her..pt was very appreciative.

## 2018-01-10 NOTE — Telephone Encounter (Signed)
Returned call. Discussed DCCV instructions again w/ pt. Spent over 10 min speaking w/ pt. Pt appreciative of my time and call.

## 2018-01-11 ENCOUNTER — Telehealth: Payer: Self-pay | Admitting: Cardiology

## 2018-01-11 ENCOUNTER — Ambulatory Visit: Payer: Medicare HMO | Admitting: Pharmacist Clinician (PhC)/ Clinical Pharmacy Specialist

## 2018-01-11 ENCOUNTER — Other Ambulatory Visit: Payer: Self-pay | Admitting: Pharmacist Clinician (PhC)/ Clinical Pharmacy Specialist

## 2018-01-11 DIAGNOSIS — Z01812 Encounter for preprocedural laboratory examination: Secondary | ICD-10-CM | POA: Diagnosis not present

## 2018-01-11 DIAGNOSIS — I4819 Other persistent atrial fibrillation: Secondary | ICD-10-CM

## 2018-01-11 DIAGNOSIS — I480 Paroxysmal atrial fibrillation: Secondary | ICD-10-CM | POA: Diagnosis not present

## 2018-01-11 DIAGNOSIS — Z7901 Long term (current) use of anticoagulants: Secondary | ICD-10-CM

## 2018-01-11 LAB — POCT INR: INR: 2.7 (ref 2.0–3.0)

## 2018-01-11 NOTE — Telephone Encounter (Signed)
Reviewed instructions w/ pt again (she misplaced paper she wrote everything down on). She thanks me for speaking with her.

## 2018-01-11 NOTE — Telephone Encounter (Signed)
New Message         Patient is calling today to ask questions about her up coming surgery

## 2018-01-11 NOTE — Patient Instructions (Signed)
Description   Continue with 1/2 tablet daily except 1 tablet each Friday.  Repeat INR in 2 weeks for DCCV.   *Dose  instructions given to [facility] [phone]) per patient's request*

## 2018-01-13 ENCOUNTER — Ambulatory Visit (HOSPITAL_COMMUNITY): Payer: Medicare HMO | Admitting: Anesthesiology

## 2018-01-13 ENCOUNTER — Encounter (HOSPITAL_COMMUNITY): Payer: Self-pay

## 2018-01-13 ENCOUNTER — Ambulatory Visit (HOSPITAL_COMMUNITY)
Admission: RE | Admit: 2018-01-13 | Discharge: 2018-01-13 | Disposition: A | Discharge status: Home / Self Care | Payer: Medicare HMO | Source: Ambulatory Visit | Attending: Internal Medicine | Admitting: Internal Medicine

## 2018-01-13 ENCOUNTER — Other Ambulatory Visit: Payer: Self-pay

## 2018-01-13 ENCOUNTER — Encounter (HOSPITAL_COMMUNITY)
Admission: RE | Disposition: A | Discharge status: Home / Self Care | Payer: Self-pay | Source: Ambulatory Visit | Attending: Internal Medicine

## 2018-01-13 DIAGNOSIS — Z9981 Dependence on supplemental oxygen: Secondary | ICD-10-CM | POA: Diagnosis not present

## 2018-01-13 DIAGNOSIS — I110 Hypertensive heart disease with heart failure: Secondary | ICD-10-CM | POA: Insufficient documentation

## 2018-01-13 DIAGNOSIS — J449 Chronic obstructive pulmonary disease, unspecified: Secondary | ICD-10-CM | POA: Insufficient documentation

## 2018-01-13 DIAGNOSIS — Z7984 Long term (current) use of oral hypoglycemic drugs: Secondary | ICD-10-CM | POA: Diagnosis not present

## 2018-01-13 DIAGNOSIS — I4892 Unspecified atrial flutter: Secondary | ICD-10-CM

## 2018-01-13 DIAGNOSIS — Z79899 Other long term (current) drug therapy: Secondary | ICD-10-CM | POA: Insufficient documentation

## 2018-01-13 DIAGNOSIS — I5033 Acute on chronic diastolic (congestive) heart failure: Secondary | ICD-10-CM | POA: Insufficient documentation

## 2018-01-13 DIAGNOSIS — I480 Paroxysmal atrial fibrillation: Secondary | ICD-10-CM | POA: Diagnosis not present

## 2018-01-13 DIAGNOSIS — Z87891 Personal history of nicotine dependence: Secondary | ICD-10-CM | POA: Insufficient documentation

## 2018-01-13 DIAGNOSIS — C50211 Malignant neoplasm of upper-inner quadrant of right female breast: Secondary | ICD-10-CM | POA: Diagnosis not present

## 2018-01-13 DIAGNOSIS — E119 Type 2 diabetes mellitus without complications: Secondary | ICD-10-CM | POA: Insufficient documentation

## 2018-01-13 HISTORY — PX: CARDIOVERSION: SHX1299

## 2018-01-13 SURGERY — Surgical Case
Anesthesia: *Unknown

## 2018-01-13 SURGERY — CARDIOVERSION
Anesthesia: General

## 2018-01-13 MED ORDER — LIDOCAINE 2% (20 MG/ML) 5 ML SYRINGE
INTRAMUSCULAR | Status: DC | PRN
  Administered 2018-01-13: 60 mg via INTRAVENOUS

## 2018-01-13 MED ORDER — SODIUM CHLORIDE 0.9 % IV SOLN
INTRAVENOUS | Status: DC
  Administered 2018-01-13: 10:00:00 via INTRAVENOUS

## 2018-01-13 MED ORDER — PROPOFOL 10 MG/ML IV BOLUS
INTRAVENOUS | Status: DC | PRN
  Administered 2018-01-13: 60 mg via INTRAVENOUS

## 2018-01-13 NOTE — Transfer of Care (Signed)
Immediate Anesthesia Transfer of Care Note  Patient: Natasha Fletcher  Procedure(s) Performed: CARDIOVERSION (N/A )  Patient Location: Endoscopy Unit  Anesthesia Type:General  Level of Consciousness: drowsy  Airway & Oxygen Therapy: Patient Spontanous Breathing and Patient connected to nasal cannula oxygen  Post-op Assessment: Report given to RN and Post -op Vital signs reviewed and stable  Post vital signs: Reviewed and stable  Last Vitals:  Vitals Value Taken Time  BP    Temp    Pulse    Resp    SpO2      Last Pain:  Vitals:   01/13/18 0952  TempSrc: Oral  PainSc: 0-No pain         Complications: No apparent anesthesia complications

## 2018-01-13 NOTE — Anesthesia Postprocedure Evaluation (Signed)
Anesthesia Post Note  Patient: Natasha Fletcher  Procedure(s) Performed: CARDIOVERSION (N/A )     Patient location during evaluation: PACU Anesthesia Type: General Level of consciousness: awake Pain management: pain level controlled Vital Signs Assessment: post-procedure vital signs reviewed and stable Respiratory status: spontaneous breathing, nonlabored ventilation, respiratory function stable and patient connected to nasal cannula oxygen Cardiovascular status: blood pressure returned to baseline and stable Postop Assessment: no apparent nausea or vomiting Anesthetic complications: no    Last Vitals:  Vitals:   01/13/18 1130 01/13/18 1135  BP: (!) 131/56 (!) 135/59  Pulse: 62 (!) 59  Resp: (!) 22 (!) 23  Temp:    SpO2: 99% 94%    Last Pain:  Vitals:   01/13/18 1135  TempSrc:   PainSc: 0-No pain                 Ryan P Ellender

## 2018-01-13 NOTE — CV Procedure (Signed)
Procedure: Electrical Cardioversion Indications:  Atrial Flutter  Procedure Details:  Consent: Risks of procedure as well as the alternatives and risks of each were explained to the (patient/caregiver).  Consent for procedure obtained.  Time Out: Verified patient identification, verified procedure, site/side was marked, verified correct patient position, special equipment/implants available, medications/allergies/relevent history reviewed, required imaging and test results available. Time out performed.  Patient placed on cardiac monitor, pulse oximetry, supplemental oxygen as necessary.  Sedation given: per anestheisa Pacer pads placed anterior and posterior chest.  Cardioverted 1 time(s).  Cardioversion with synchronized biphasic 120 J shock.  Evaluation: Findings: Post procedure EKG shows: paced rhythm. Complications: no complications. Patient tolerated procedure well.  Time Spent Directly with the Patient:  30 minutes   Elouise Munroe 01/13/2018, 3:13 PM

## 2018-01-13 NOTE — Anesthesia Preprocedure Evaluation (Addendum)
Anesthesia Evaluation  Patient identified by MRN, date of birth, ID band Patient awake    Reviewed: Allergy & Precautions, NPO status , Patient's Chart, lab work & pertinent test results, reviewed documented beta blocker date and time   Airway Mallampati: III  TM Distance: >3 FB Neck ROM: Full    Dental no notable dental hx. (+) Upper Dentures, Lower Dentures   Pulmonary asthma , COPD,  oxygen dependent, former smoker,    Pulmonary exam normal breath sounds clear to auscultation       Cardiovascular hypertension, Pt. on medications and Pt. on home beta blockers +CHF  Normal cardiovascular exam+ dysrhythmias Atrial Fibrillation + pacemaker  Rhythm:Regular Rate:Normal  ECG: V-paced, rate 62  ECHO: Left ventricle: The cavity size was normal. There was moderate concentric hypertrophy. Systolic function was vigorous. The estimated ejection fraction was in the range of 65% to 70%. Wall motion was normal; there were no regional wall motion abnormalities. Features are consistent with a pseudonormal left ventricular filling pattern, with concomitant abnormal relaxation and increased filling pressure (grade 2 diastolic dysfunction). Left atrium: The atrium was mildly dilated. Right ventricle: Pacer wire or catheter noted in right ventricle. Right atrium: Pacer wire or catheter noted in right atrium. Tricuspid valve: There was moderate regurgitation. Pulmonary arteries: Systolic pressure was mildly increased. PA peak pressure: 32 mm Hg (S).   Neuro/Psych negative neurological ROS  negative psych ROS   GI/Hepatic Neg liver ROS, GERD  ,  Endo/Other  diabetes  Renal/GU negative Renal ROS     Musculoskeletal negative musculoskeletal ROS (+)   Abdominal   Peds  Hematology negative hematology ROS (+)   Anesthesia Other Findings   Reproductive/Obstetrics                            Anesthesia  Physical Anesthesia Plan  ASA: IV  Anesthesia Plan: General   Post-op Pain Management:    Induction: Intravenous  PONV Risk Score and Plan: 3 and Propofol infusion and Treatment may vary due to age or medical condition  Airway Management Planned: Mask  Additional Equipment:   Intra-op Plan:   Post-operative Plan:   Informed Consent: I have reviewed the patients History and Physical, chart, labs and discussed the procedure including the risks, benefits and alternatives for the proposed anesthesia with the patient or authorized representative who has indicated his/her understanding and acceptance.   Dental advisory given  Plan Discussed with: CRNA  Anesthesia Plan Comments:        Anesthesia Quick Evaluation

## 2018-01-13 NOTE — Anesthesia Procedure Notes (Signed)
Date/Time: 01/13/2018 11:19 AM Performed by: Imagene Riches, CRNA Pre-anesthesia Checklist: Patient identified, Emergency Drugs available, Suction available and Patient being monitored Patient Re-evaluated:Patient Re-evaluated prior to induction Oxygen Delivery Method: Ambu bag Preoxygenation: Pre-oxygenation with 100% oxygen

## 2018-01-13 NOTE — H&P (Signed)
I have seen and examined the patient and agree with proceeding with cardioversion for atrial .  GEN: Well nourished, well developed, in no acute distress  HEENT: normal  Neck: no JVD, carotid bruits, or masses Cardiac: iRRR; no murmurs, rubs, or gallops,no edema  Respiratory:  clear to auscultation bilaterally, normal work of breathing GI: soft, nontender, nondistended, + BS MS: no deformity or atrophy  Skin: warm and dry, device site well healed Neuro:  Strength and sensation are intact Psych: euthymic mood, full affect  1.  Persistent Atrial fibrillation/flutter: On Coumadin.  INR: 2.7 K: 4.5  This procedure has been fully reviewed with the patient and written informed consent has been obtained.

## 2018-01-13 NOTE — Discharge Instructions (Signed)
Electrical Cardioversion, Care After °This sheet gives you information about how to care for yourself after your procedure. Your health care provider may also give you more specific instructions. If you have problems or questions, contact your health care provider. °What can I expect after the procedure? °After the procedure, it is common to have: °· Some redness on the skin where the shocks were given. ° °Follow these instructions at home: °· Do not drive for 24 hours if you were given a medicine to help you relax (sedative). °· Take over-the-counter and prescription medicines only as told by your health care provider. °· Ask your health care provider how to check your pulse. Check it often. °· Rest for 48 hours after the procedure or as told by your health care provider. °· Avoid or limit your caffeine use as told by your health care provider. °Contact a health care provider if: °· You feel like your heart is beating too quickly or your pulse is not regular. °· You have a serious muscle cramp that does not go away. °Get help right away if: °· You have discomfort in your chest. °· You are dizzy or you feel faint. °· You have trouble breathing or you are short of breath. °· Your speech is slurred. °· You have trouble moving an arm or leg on one side of your body. °· Your fingers or toes turn cold or blue. °This information is not intended to replace advice given to you by your health care provider. Make sure you discuss any questions you have with your health care provider. °Document Released: 01/10/2013 Document Revised: 10/24/2015 Document Reviewed: 09/26/2015 °Elsevier Interactive Patient Education © 2018 [employer] ° °

## 2018-01-14 ENCOUNTER — Encounter (HOSPITAL_COMMUNITY): Payer: Self-pay | Admitting: Internal Medicine

## 2018-01-14 LAB — BASIC METABOLIC PANEL
BUN / CREAT RATIO: 22 (ref 12–28)
BUN: 42 mg/dL — AB (ref 10–36)
CALCIUM: 9.3 mg/dL (ref 8.7–10.3)
CHLORIDE: 96 mmol/L (ref 96–106)
CO2: 25 mmol/L (ref 20–29)
Creatinine, Ser: 1.91 mg/dL — ABNORMAL HIGH (ref 0.57–1.00)
GFR calc non Af Amer: 23 mL/min/{1.73_m2} — ABNORMAL LOW (ref >59)
GFR, EST AFRICAN AMERICAN: 26 mL/min/{1.73_m2} — AB (ref >59)
GLUCOSE: 90 mg/dL (ref 65–99)
POTASSIUM: 4.5 mmol/L (ref 3.5–5.2)
Sodium: 139 mmol/L (ref 134–144)

## 2018-01-14 LAB — CBC
Hematocrit: 30.5 % — ABNORMAL LOW (ref 34.0–46.6)
Hemoglobin: 9.6 g/dL — ABNORMAL LOW (ref 11.1–15.9)
MCH: 27 pg (ref 26.6–33.0)
MCHC: 31.5 g/dL (ref 31.5–35.7)
MCV: 86 fL (ref 79–97)
PLATELETS: 275 10*3/uL (ref 150–450)
RBC: 3.56 x10E6/uL — AB (ref 3.77–5.28)
RDW: 14.7 % (ref 12.3–15.4)
WBC: 4.1 10*3/uL (ref 3.4–10.8)

## 2018-01-16 ENCOUNTER — Other Ambulatory Visit (HOSPITAL_COMMUNITY): Payer: Self-pay

## 2018-01-16 NOTE — Progress Notes (Signed)
Paramedicine Encounter    Patient ID: Natasha Fletcher, female    DOB: June 14, 1925, 84 y.o.   MRN: [id number]   Patient Care Team: Lorene Dy, MD as PCP - General (Internal Medicine) Constance Haw, MD as PCP - Electrophysiology (Cardiology) Troy Sine, MD as PCP - Cardiology (Cardiology)  Patient Active Problem List   Diagnosis Date Noted  . Cognitive decline   . Palliative care by specialist   . DNR (do not resuscitate) discussion   . Acute on chronic congestive heart failure ([place])   . Acute on chronic diastolic CHF (congestive heart failure) ([place])   . Sick sinus syndrome due to SA node dysfunction (HCC)   . Shortness of breath 06/13/2017  . Atrial fibrillation ([place]) 03/22/2016  . Bradycardia 08/22/2015  . Junctional (nodal) bradycardia 08/22/2015  . Malnutrition of moderate degree 03/19/2015  . Benign essential HTN   . Bradycardia, drug induced 03/18/2015  . Hypokalemia 02/16/2015  . Thrombocytopenia ([place]) 02/16/2015  . Sepsis due to urinary tract infection ([place])   . Sepsis ([place]) 02/12/2015  . Urinary tract infectious disease   . Long-term (current) use of anticoagulants 07/12/2014  . Chronic anticoagulation 02/11/2014  . Hypertensive cardiovascular disease 01/24/2014  . Chronic diastolic CHF (congestive heart failure) ([place]) 01/24/2014  . Anemia 01/24/2014  . Breast cancer of upper-inner quadrant of right female breast ([place]) 01/24/2014  . Acute diastolic heart failure ([address]) 11/11/2013  . Atrial flutter ([place]) 11/11/2013  . PAF (paroxysmal atrial fibrillation) ([place]) 11/10/2013  . Slurred speech 11/10/2013  . Protein-calorie malnutrition, severe ([place]) 11/06/2013  . Coffee ground emesis 11/05/2013  . Dehydration 10/19/2013  . Urinary retention 10/18/2013  . GERD (gastroesophageal reflux disease) 03/20/2013  . S/P total knee arthroplasty   . UTI (urinary tract infection) 02/21/2013  . OA (osteoarthritis) of knee 02/19/2013  . Hypertension   . Thyroid disease    . Cancer of central portion of female breast ([place]) 04/14/2011    Current Outpatient Medications:  .  amiodarone (PACERONE) 200 MG tablet, Take 0.5 tablets (100 mg total) by mouth daily., Disp: 45 tablet, Rfl: 2 .  carvedilol (COREG) 3.125 MG tablet, TAKE 1 TABLET (3.125 MG TOTAL) BY MOUTH 2 (TWO) TIMES DAILY WITH A MEAL., Disp: 60 tablet, Rfl: 3 .  donepezil (ARICEPT) 5 MG tablet, Take 1 tablet (5 mg total) by mouth at bedtime., Disp: 30 tablet, Rfl: 0 .  ENSURE (ENSURE), Take 237 mLs by mouth 3 (three) times daily. , Disp: , Rfl:  .  ferrous sulfate 325 (65 FE) MG tablet, Take 1 tablet (325 mg total) by mouth every other day. (Patient taking differently: Take 325 mg by mouth every other day. ), Disp: 30 tablet, Rfl: 3 .  furosemide (LASIX) 80 MG tablet, TAKE 1 TABLET BY MOUTH TWICE A DAY FOR EDEMA/HIGH BP (Patient taking differently: Take 80 mg by mouth 2 (two) times daily. ), Disp: 60 tablet, Rfl: 3 .  HYDROcodone-acetaminophen (NORCO) 10-325 MG tablet, Take 1 tablet by mouth 3 (three) times daily as needed for severe pain. , Disp: , Rfl:  .  latanoprost (XALATAN) 0.005 % ophthalmic solution, Place 1 drop into both eyes at bedtime., Disp: , Rfl:  .  macitentan (OPSUMIT) 10 MG tablet, Take 1 tablet (10 mg total) by mouth daily., Disp: 30 tablet, Rfl: 11 .  Melatonin 3 MG TABS, Take 9 mg by mouth at bedtime. , Disp: , Rfl:  .  oxybutynin (DITROPAN) 5 MG tablet, Take 5 mg by mouth 2 (  two) times daily. , Disp: , Rfl:  .  polyethylene glycol (MIRALAX / GLYCOLAX) packet, Take 17 g by mouth daily. (Patient taking differently: Take 17 g by mouth daily as needed for mild constipation. ), Disp: 14 each, Rfl: 0 .  potassium chloride SA (K-DUR,KLOR-CON) 20 MEQ tablet, Take 1 tablet (20 mEq total) by mouth daily., Disp: 90 tablet, Rfl: 3 .  sertraline (ZOLOFT) 25 MG tablet, Take 1 tablet (25 mg total) by mouth daily., Disp: 30 tablet, Rfl: 0 .  tadalafil, PAH, (ADCIRCA) 20 MG tablet, Take 2 tablets (40 mg  total) by mouth daily., Disp: 60 tablet, Rfl: 11 .  traZODone (DESYREL) 100 MG tablet, TAKE 1 TABLET BY MOUTH EVERYDAY AT BEDTIME (Patient taking differently: Take 100 mg by mouth at bedtime. ), Disp: 90 tablet, Rfl: 0 .  VENTOLIN HFA 108 (90 Base) MCG/ACT inhaler, TAKE 2 PUFFS BY MOUTH EVERY 6 HOURS AS NEEDED FOR WHEEZE OR SHORTNESS OF BREATH (Patient taking differently: Inhale 2 puffs into the lungs every 6 (six) hours as needed for wheezing or shortness of breath. ), Disp: 1 Inhaler, Rfl: 2 .  warfarin (COUMADIN) 4 MG tablet, TAKE 1/2 TO 1 TABLET BY MOUTH DAILY AS DIRECTED BY COUMADIN CLINIC (Patient taking differently: Take 2-4 mg by mouth See admin instructions. Take 2 mg every day in the evening except Fridays take 4 mg in the evening), Disp: 30 tablet, Rfl: 2 .  LORazepam (ATIVAN) 1 MG tablet, Take 1 mg by mouth at bedtime. , Disp: , Rfl: 2 Allergies  Allergen Reactions  . Lactose Intolerance (Gi) Other (See Comments)    Stomach pain  . Sulfa Antibiotics Itching      Social History   Socioeconomic History  . Marital status: Single    Spouse name: Not on file  . Number of children: 0  . Years of education: 74  . Highest education level: Some college, no degree  Occupational History  . Occupation: retired from [employer]  Social Needs  . Financial resource strain: Not on file  . Food insecurity:    Worry: Not on file    Inability: Not on file  . Transportation needs:    Medical: Not on file    Non-medical: Not on file  Tobacco Use  . Smoking status: Former Smoker    Packs/day: 0.00    Years: 30.00    Pack years: 0.00    Types: Cigarettes    Last attempt to quit: 04/21/1975    Years since quitting: 42.7  . Smokeless tobacco: Never Used  Substance and Sexual Activity  . Alcohol use: Not Currently    Comment: occasionally  . Drug use: No  . Sexual activity: Not Currently  Lifestyle  . Physical activity:    Days per week: Not on file    Minutes per  session: Not on file  . Stress: Not on file  Relationships  . Social connections:    Talks on phone: Not on file    Gets together: Not on file    Attends religious service: Not on file    Active member of club or organization: Not on file    Attends meetings of clubs or organizations: Not on file    Relationship status: Not on file  . Intimate partner violence:    Fear of current or ex partner: Not on file    Emotionally abused: Not on file    Physically abused: Not on file    Forced sexual  activity: Not on file  Other Topics Concern  . Not on file  Social History Narrative   Lives alone.  Has a dog.  No children.  Retired from Equities trader.  Education: some college.     Physical Exam      Future Appointments  Date Time Provider [place]  01/24/2018  9:40 AM Larey Dresser, MD [facility] None  02/01/2018  9:30 AM Narda Amber K, DO [facility] None  03/08/2018  8:45 AM Constance Haw, MD [facility]  03/21/2018  7:40 AM [facility] DEVICE REMOTES [facility]    BP (!) 110/52   Pulse 62   Resp 14   Wt 128 lb (58.1 kg)   SpO2 99%   BMI 21.97 kg/m   Weight yesterday-129 Last visit weight-129  Pt reports she is doing ok, she has good days and bad days.  She had cardioversion done this past Friday, no issues or concerns. Pt states she may feel like she is feeling better.  Pt denies any c/p. No dizziness.  Poor appetite still.   Pt expressed concern about her foley bag leaking at night time and the urine has been very dark-she goes to urology tomor and I advised her to relay all this to them, she also expressed concern about difficulty taking shower by herself. I will seek more advise to see what resource is available to assist with getting a home aide.   Palliative care?  [facility]?  [employer]?  [employer]?   Marylouise Stacks, [place] [facility] Paramedic  01/16/18

## 2018-01-17 ENCOUNTER — Encounter (HOSPITAL_COMMUNITY): Payer: Self-pay

## 2018-01-17 ENCOUNTER — Ambulatory Visit (HOSPITAL_COMMUNITY): Admit: 2018-01-17 | Payer: Medicare HMO | Admitting: Cardiology

## 2018-01-17 DIAGNOSIS — N312 Flaccid neuropathic bladder, not elsewhere classified: Secondary | ICD-10-CM | POA: Diagnosis not present

## 2018-01-17 SURGERY — CARDIOVERSION
Anesthesia: General

## 2018-01-18 ENCOUNTER — Telehealth (HOSPITAL_COMMUNITY): Payer: Self-pay | Admitting: Licensed Clinical Social Worker

## 2018-01-18 ENCOUNTER — Telehealth: Payer: Self-pay | Admitting: Pharmacist

## 2018-01-18 NOTE — Telephone Encounter (Signed)
CSW informed by pts community paramedic that patient has been struggling some to care for herself at home.  Patient has a son who can help with some things but cannot assist with ADLs.  Patient hopeful that she can get some kind of personal care services.  CSW called and spoke with patient to get further information.  Patient gets $1,466/month and has about $3,000 in savings- she would not be able to qualify for regular medicaid with this.  CSW called [facility] Home Aid Program through [place], [phone], and left message for worker- supposedly you can be eligible for up to 8 hours of in home aid assistance even if you don't qualify for Medicaid  CSW will continue to follow in clinic and assist as needed  Jorge Ny, [place] Social Worker [phone]

## 2018-01-18 NOTE — Telephone Encounter (Signed)
Nosebleed in the AM (patient on oxygen with nose canula). Stopped quickly after cold compress.  Next INR check in 1 week.   No changes recommended at this time.

## 2018-01-19 ENCOUNTER — Telehealth (HOSPITAL_COMMUNITY): Payer: Self-pay | Admitting: Licensed Clinical Social Worker

## 2018-01-19 NOTE — Telephone Encounter (Signed)
CSW received call from [place] regarding pt referral for personal care services.  CSW provided information for referral to be processed- per DSS worker the program has a 2 year waitlist but due to the patient's age she would be pushed to the top of the list though they still anticipate a few months waiting period.  CSW updated patient regarding referral and that they might be reaching out for further information if needed  CSW will continue to follow in clinic and assist as needed  Jorge Ny, [place] Worker [phone]

## 2018-01-23 ENCOUNTER — Other Ambulatory Visit (HOSPITAL_COMMUNITY): Payer: Self-pay

## 2018-01-23 NOTE — Progress Notes (Signed)
Paramedicine Encounter    Patient ID: Natasha Fletcher, female    DOB: July 08, 1925, 84 y.o.   MRN: [id number]   Patient Care Team: Lorene Dy, MD as PCP - General (Internal Medicine) Constance Haw, MD as PCP - Electrophysiology (Cardiology) Troy Sine, MD as PCP - Cardiology (Cardiology)  Patient Active Problem List   Diagnosis Date Noted  . Cognitive decline   . Palliative care by specialist   . DNR (do not resuscitate) discussion   . Acute on chronic congestive heart failure ([place])   . Acute on chronic diastolic CHF (congestive heart failure) ([place])   . Sick sinus syndrome due to SA node dysfunction (HCC)   . Shortness of breath 06/13/2017  . Atrial fibrillation ([place]) 03/22/2016  . Bradycardia 08/22/2015  . Junctional (nodal) bradycardia 08/22/2015  . Malnutrition of moderate degree 03/19/2015  . Benign essential HTN   . Bradycardia, drug induced 03/18/2015  . Hypokalemia 02/16/2015  . Thrombocytopenia ([place]) 02/16/2015  . Sepsis due to urinary tract infection ([place])   . Sepsis ([place]) 02/12/2015  . Urinary tract infectious disease   . Long-term (current) use of anticoagulants 07/12/2014  . Chronic anticoagulation 02/11/2014  . Hypertensive cardiovascular disease 01/24/2014  . Chronic diastolic CHF (congestive heart failure) ([place]) 01/24/2014  . Anemia 01/24/2014  . Breast cancer of upper-inner quadrant of right female breast ([place]) 01/24/2014  . Acute diastolic heart failure ([place]) 11/11/2013  . Atrial flutter ([place]) 11/11/2013  . PAF (paroxysmal atrial fibrillation) ([place]) 11/10/2013  . Slurred speech 11/10/2013  . Protein-calorie malnutrition, severe ([place]) 11/06/2013  . Coffee ground emesis 11/05/2013  . Dehydration 10/19/2013  . Urinary retention 10/18/2013  . GERD (gastroesophageal reflux disease) 03/20/2013  . S/P total knee arthroplasty   . UTI (urinary tract infection) 02/21/2013  . OA (osteoarthritis) of knee 02/19/2013  . Hypertension   . Thyroid disease    . Cancer of central portion of female breast ([facility]) 04/14/2011    Current Outpatient Medications:  .  amiodarone (PACERONE) 200 MG tablet, Take 0.5 tablets (100 mg total) by mouth daily., Disp: 45 tablet, Rfl: 2 .  carvedilol (COREG) 3.125 MG tablet, TAKE 1 TABLET (3.125 MG TOTAL) BY MOUTH 2 (TWO) TIMES DAILY WITH A MEAL., Disp: 60 tablet, Rfl: 3 .  donepezil (ARICEPT) 5 MG tablet, Take 1 tablet (5 mg total) by mouth at bedtime., Disp: 30 tablet, Rfl: 0 .  ENSURE (ENSURE), Take 237 mLs by mouth 3 (three) times daily. , Disp: , Rfl:  .  ferrous sulfate 325 (65 FE) MG tablet, Take 1 tablet (325 mg total) by mouth every other day. (Patient taking differently: Take 325 mg by mouth every other day. ), Disp: 30 tablet, Rfl: 3 .  furosemide (LASIX) 80 MG tablet, TAKE 1 TABLET BY MOUTH TWICE A DAY FOR EDEMA/HIGH BP (Patient taking differently: Take 80 mg by mouth 2 (two) times daily. ), Disp: 60 tablet, Rfl: 3 .  HYDROcodone-acetaminophen (NORCO) 10-325 MG tablet, Take 1 tablet by mouth 3 (three) times daily as needed for severe pain. , Disp: , Rfl:  .  latanoprost (XALATAN) 0.005 % ophthalmic solution, Place 1 drop into both eyes at bedtime., Disp: , Rfl:  .  macitentan (OPSUMIT) 10 MG tablet, Take 1 tablet (10 mg total) by mouth daily., Disp: 30 tablet, Rfl: 11 .  Melatonin 3 MG TABS, Take 9 mg by mouth at bedtime. , Disp: , Rfl:  .  oxybutynin (DITROPAN) 5 MG tablet, Take 5 mg by mouth 2 (  two) times daily. , Disp: , Rfl:  .  polyethylene glycol (MIRALAX / GLYCOLAX) packet, Take 17 g by mouth daily. (Patient taking differently: Take 17 g by mouth daily as needed for mild constipation. ), Disp: 14 each, Rfl: 0 .  potassium chloride SA (K-DUR,KLOR-CON) 20 MEQ tablet, Take 1 tablet (20 mEq total) by mouth daily., Disp: 90 tablet, Rfl: 3 .  sertraline (ZOLOFT) 25 MG tablet, Take 1 tablet (25 mg total) by mouth daily., Disp: 30 tablet, Rfl: 0 .  tadalafil, PAH, (ADCIRCA) 20 MG tablet, Take 2 tablets (40 mg  total) by mouth daily., Disp: 60 tablet, Rfl: 11 .  traZODone (DESYREL) 100 MG tablet, TAKE 1 TABLET BY MOUTH EVERYDAY AT BEDTIME (Patient taking differently: Take 100 mg by mouth at bedtime. ), Disp: 90 tablet, Rfl: 0 .  VENTOLIN HFA 108 (90 Base) MCG/ACT inhaler, TAKE 2 PUFFS BY MOUTH EVERY 6 HOURS AS NEEDED FOR WHEEZE OR SHORTNESS OF BREATH (Patient taking differently: Inhale 2 puffs into the lungs every 6 (six) hours as needed for wheezing or shortness of breath. ), Disp: 1 Inhaler, Rfl: 2 .  warfarin (COUMADIN) 4 MG tablet, TAKE 1/2 TO 1 TABLET BY MOUTH DAILY AS DIRECTED BY COUMADIN CLINIC (Patient taking differently: Take 2-4 mg by mouth See admin instructions. Take 2 mg every day in the evening except Fridays take 4 mg in the evening), Disp: 30 tablet, Rfl: 2 .  LORazepam (ATIVAN) 1 MG tablet, Take 1 mg by mouth at bedtime. , Disp: , Rfl: 2 Allergies  Allergen Reactions  . Lactose Intolerance (Gi) Other (See Comments)    Stomach pain  . Sulfa Antibiotics Itching      Social History   Socioeconomic History  . Marital status: Single    Spouse name: Not on file  . Number of children: 0  . Years of education: 77  . Highest education level: Some college, no degree  Occupational History  . Occupation: retired from [employer]  Social Needs  . Financial resource strain: Not on file  . Food insecurity:    Worry: Not on file    Inability: Not on file  . Transportation needs:    Medical: Not on file    Non-medical: Not on file  Tobacco Use  . Smoking status: Former Smoker    Packs/day: 0.00    Years: 30.00    Pack years: 0.00    Types: Cigarettes    Last attempt to quit: 04/21/1975    Years since quitting: 42.7  . Smokeless tobacco: Never Used  Substance and Sexual Activity  . Alcohol use: Not Currently    Comment: occasionally  . Drug use: No  . Sexual activity: Not Currently  Lifestyle  . Physical activity:    Days per week: Not on file    Minutes per  session: Not on file  . Stress: Not on file  Relationships  . Social connections:    Talks on phone: Not on file    Gets together: Not on file    Attends religious service: Not on file    Active member of club or organization: Not on file    Attends meetings of clubs or organizations: Not on file    Relationship status: Not on file  . Intimate partner violence:    Fear of current or ex partner: Not on file    Emotionally abused: Not on file    Physically abused: Not on file    Forced sexual  activity: Not on file  Other Topics Concern  . Not on file  Social History Narrative   Lives alone.  Has a dog.  No children.  Retired from Equities trader.  Education: some college.     Physical Exam  Constitutional: She is oriented to person, place, and time. She appears well-developed.  Cardiovascular: Normal rate.  Pulmonary/Chest: Effort normal. No respiratory distress.  Abdominal: Soft.  Neurological: She is alert and oriented to person, place, and time.  Skin: Skin is warm and dry.        Future Appointments  Date Time Provider [place]  02/01/2018  8:30 AM [facility] PA/NP [facility] None  02/01/2018  9:30 AM Narda Amber K, DO [facility] None  03/08/2018  8:45 AM Constance Haw, MD [facility]  03/21/2018  7:40 AM [facility] DEVICE REMOTES [facility]    BP (!) 110/56   Pulse 64   Resp 15   Wt 128 lb (58.1 kg)   SpO2 98%   BMI 21.97 kg/m   Weight yesterday-129  Last visit weight-128   Pt reports this morning she was not feeling well so she asked for me to come by this afternoon, pt states she is not eating good, poor appetite. And also not sleeping well either. She is drinking at least 2 ensures daily. No swelling noted. She c/o dizziness at times. Sob upon mild exertion. Wears 02 24/7.  No missed doses of her meds this week. meds verified and pill box refilled.  Pt is interested in some type of senior resource center  to get her out and about active in the community.   Natasha Fletcher, [place] [facility] Paramedic  01/23/18

## 2018-01-24 ENCOUNTER — Encounter (HOSPITAL_COMMUNITY): Payer: Medicare HMO | Admitting: Cardiology

## 2018-01-30 ENCOUNTER — Other Ambulatory Visit (HOSPITAL_COMMUNITY): Payer: Self-pay

## 2018-01-30 ENCOUNTER — Telehealth: Payer: Self-pay | Admitting: Neurology

## 2018-01-30 NOTE — Progress Notes (Signed)
Paramedicine Encounter    Patient ID: Natasha Fletcher, female    DOB: Aug 22, 1925, 84 y.o.   MRN: [id number]   Patient Care Team: Lorene Dy, MD as PCP - General (Internal Medicine) Constance Haw, MD as PCP - Electrophysiology (Cardiology) Troy Sine, MD as PCP - Cardiology (Cardiology)  Patient Active Problem List   Diagnosis Date Noted  . Cognitive decline   . Palliative care by specialist   . DNR (do not resuscitate) discussion   . Acute on chronic congestive heart failure ([place])   . Acute on chronic diastolic CHF (congestive heart failure) ([place])   . Sick sinus syndrome due to SA node dysfunction (HCC)   . Shortness of breath 06/13/2017  . Atrial fibrillation ([place]) 03/22/2016  . Bradycardia 08/22/2015  . Junctional (nodal) bradycardia 08/22/2015  . Malnutrition of moderate degree 03/19/2015  . Benign essential HTN   . Bradycardia, drug induced 03/18/2015  . Hypokalemia 02/16/2015  . Thrombocytopenia ([place]) 02/16/2015  . Sepsis due to urinary tract infection ([place])   . Sepsis ([place]) 02/12/2015  . Urinary tract infectious disease   . Long-term (current) use of anticoagulants 07/12/2014  . Chronic anticoagulation 02/11/2014  . Hypertensive cardiovascular disease 01/24/2014  . Chronic diastolic CHF (congestive heart failure) ([place]) 01/24/2014  . Anemia 01/24/2014  . Breast cancer of upper-inner quadrant of right female breast ([place]) 01/24/2014  . Acute diastolic heart failure ([place]) 11/11/2013  . Atrial flutter ([place]) 11/11/2013  . PAF (paroxysmal atrial fibrillation) ([place]) 11/10/2013  . Slurred speech 11/10/2013  . Protein-calorie malnutrition, severe ([place]) 11/06/2013  . Coffee ground emesis 11/05/2013  . Dehydration 10/19/2013  . Urinary retention 10/18/2013  . GERD (gastroesophageal reflux disease) 03/20/2013  . S/P total knee arthroplasty   . UTI (urinary tract infection) 02/21/2013  . OA (osteoarthritis) of knee 02/19/2013  . Hypertension   . Thyroid disease    . Cancer of central portion of female breast ([place]) 04/14/2011    Current Outpatient Medications:  .  amiodarone (PACERONE) 200 MG tablet, Take 0.5 tablets (100 mg total) by mouth daily., Disp: 45 tablet, Rfl: 2 .  carvedilol (COREG) 3.125 MG tablet, TAKE 1 TABLET (3.125 MG TOTAL) BY MOUTH 2 (TWO) TIMES DAILY WITH A MEAL., Disp: 60 tablet, Rfl: 3 .  donepezil (ARICEPT) 5 MG tablet, Take 1 tablet (5 mg total) by mouth at bedtime., Disp: 30 tablet, Rfl: 0 .  ENSURE (ENSURE), Take 237 mLs by mouth 3 (three) times daily. , Disp: , Rfl:  .  ferrous sulfate 325 (65 FE) MG tablet, Take 1 tablet (325 mg total) by mouth every other day. (Patient taking differently: Take 325 mg by mouth every other day. ), Disp: 30 tablet, Rfl: 3 .  furosemide (LASIX) 80 MG tablet, TAKE 1 TABLET BY MOUTH TWICE A DAY FOR EDEMA/HIGH BP (Patient taking differently: Take 80 mg by mouth 2 (two) times daily. ), Disp: 60 tablet, Rfl: 3 .  HYDROcodone-acetaminophen (NORCO) 10-325 MG tablet, Take 1 tablet by mouth 3 (three) times daily as needed for severe pain. , Disp: , Rfl:  .  latanoprost (XALATAN) 0.005 % ophthalmic solution, Place 1 drop into both eyes at bedtime., Disp: , Rfl:  .  macitentan (OPSUMIT) 10 MG tablet, Take 1 tablet (10 mg total) by mouth daily., Disp: 30 tablet, Rfl: 11 .  Melatonin 3 MG TABS, Take 9 mg by mouth at bedtime. , Disp: , Rfl:  .  oxybutynin (DITROPAN) 5 MG tablet, Take 5 mg by mouth 2 (  two) times daily. , Disp: , Rfl:  .  polyethylene glycol (MIRALAX / GLYCOLAX) packet, Take 17 g by mouth daily. (Patient taking differently: Take 17 g by mouth daily as needed for mild constipation. ), Disp: 14 each, Rfl: 0 .  potassium chloride SA (K-DUR,KLOR-CON) 20 MEQ tablet, Take 1 tablet (20 mEq total) by mouth daily., Disp: 90 tablet, Rfl: 3 .  sertraline (ZOLOFT) 25 MG tablet, Take 1 tablet (25 mg total) by mouth daily., Disp: 30 tablet, Rfl: 0 .  tadalafil, PAH, (ADCIRCA) 20 MG tablet, Take 2 tablets (40 mg  total) by mouth daily., Disp: 60 tablet, Rfl: 11 .  traZODone (DESYREL) 100 MG tablet, TAKE 1 TABLET BY MOUTH EVERYDAY AT BEDTIME (Patient taking differently: Take 100 mg by mouth at bedtime. ), Disp: 90 tablet, Rfl: 0 .  VENTOLIN HFA 108 (90 Base) MCG/ACT inhaler, TAKE 2 PUFFS BY MOUTH EVERY 6 HOURS AS NEEDED FOR WHEEZE OR SHORTNESS OF BREATH (Patient taking differently: Inhale 2 puffs into the lungs every 6 (six) hours as needed for wheezing or shortness of breath. ), Disp: 1 Inhaler, Rfl: 2 .  warfarin (COUMADIN) 4 MG tablet, TAKE 1/2 TO 1 TABLET BY MOUTH DAILY AS DIRECTED BY COUMADIN CLINIC (Patient taking differently: Take 2-4 mg by mouth See admin instructions. Take 2 mg every day in the evening except Fridays take 4 mg in the evening), Disp: 30 tablet, Rfl: 2 .  LORazepam (ATIVAN) 1 MG tablet, Take 1 mg by mouth at bedtime. , Disp: , Rfl: 2 Allergies  Allergen Reactions  . Lactose Intolerance (Gi) Other (See Comments)    Stomach pain  . Sulfa Antibiotics Itching      Social History   Socioeconomic History  . Marital status: Single    Spouse name: Not on file  . Number of children: 0  . Years of education: 67  . Highest education level: Some college, no degree  Occupational History  . Occupation: retired from [employer]  Social Needs  . Financial resource strain: Not on file  . Food insecurity:    Worry: Not on file    Inability: Not on file  . Transportation needs:    Medical: Not on file    Non-medical: Not on file  Tobacco Use  . Smoking status: Former Smoker    Packs/day: 0.00    Years: 30.00    Pack years: 0.00    Types: Cigarettes    Last attempt to quit: 04/21/1975    Years since quitting: 42.8  . Smokeless tobacco: Never Used  Substance and Sexual Activity  . Alcohol use: Not Currently    Comment: occasionally  . Drug use: No  . Sexual activity: Not Currently  Lifestyle  . Physical activity:    Days per week: Not on file    Minutes per  session: Not on file  . Stress: Not on file  Relationships  . Social connections:    Talks on phone: Not on file    Gets together: Not on file    Attends religious service: Not on file    Active member of club or organization: Not on file    Attends meetings of clubs or organizations: Not on file    Relationship status: Not on file  . Intimate partner violence:    Fear of current or ex partner: Not on file    Emotionally abused: Not on file    Physically abused: Not on file    Forced sexual  activity: Not on file  Other Topics Concern  . Not on file  Social History Narrative   Lives alone.  Has a dog.  No children.  Retired from [employer].  Education: some college.     Physical Exam      Future Appointments  Date Time Provider [place]  02/01/2018  8:30 AM [facility] PA/NP [facility] None  02/02/2018  8:30 AM [facility]  03/08/2018  8:45 AM Camnitz, Ocie Doyne, MD [facility]  03/21/2018  7:40 AM [facility] DEVICE REMOTES [facility]  05/08/2018  1:30 PM Patel, Arvin Collard K, DO [facility] None    BP (!) 98/50   Pulse 66   Resp 16   Wt 122 lb (55.3 kg)   SpO2 96%   BMI 20.94 kg/m   Weight yesterday-124 Last visit weight-128   Pt reports she is not feeling well today, she thinks it is side effects coming from her donepezil. She has been reading the inserts from her meds and seen where it could cause the weight loss/loss of appetite, nausea, and trouble sleeping and she is having all of those symptoms. I advised her to continue taking until she speaks to PCP next week about stopping or replacement.  She does not want to continue this medicine, she cancelled her PCP appointment today due to not feeling well. She did resch it for next Monday.  No missed doses of her meds, meds verified and pill box refilled.  Her weight has come on down from last week, she has very poor appetite as well, not  sleeping during the night. Urinating a lot and sometimes it leaks on her pamper as well.  Will discuss at clinic appoint this week as well.   Marylouise Stacks, [facility] Paramedic  01/30/18

## 2018-01-30 NOTE — Telephone Encounter (Signed)
Patient called and rescheduled her 10/30 appt to now in February. She is on a wait list. She seems confused regarding procedures and appointments she scheduled for on 10/30 and 10/31. Not sure if we referred her to those appointments? Please Call. Thanks

## 2018-01-31 ENCOUNTER — Other Ambulatory Visit: Payer: Self-pay | Admitting: Cardiovascular Disease

## 2018-01-31 DIAGNOSIS — R269 Unspecified abnormalities of gait and mobility: Secondary | ICD-10-CM | POA: Diagnosis not present

## 2018-01-31 DIAGNOSIS — R0602 Shortness of breath: Secondary | ICD-10-CM | POA: Diagnosis not present

## 2018-01-31 DIAGNOSIS — M2550 Pain in unspecified joint: Secondary | ICD-10-CM | POA: Diagnosis not present

## 2018-01-31 DIAGNOSIS — I5032 Chronic diastolic (congestive) heart failure: Secondary | ICD-10-CM | POA: Diagnosis not present

## 2018-01-31 DIAGNOSIS — Z5189 Encounter for other specified aftercare: Secondary | ICD-10-CM | POA: Diagnosis not present

## 2018-01-31 DIAGNOSIS — R5381 Other malaise: Secondary | ICD-10-CM | POA: Diagnosis not present

## 2018-01-31 DIAGNOSIS — I5022 Chronic systolic (congestive) heart failure: Secondary | ICD-10-CM | POA: Diagnosis not present

## 2018-01-31 DIAGNOSIS — S83509A Sprain of unspecified cruciate ligament of unspecified knee, initial encounter: Secondary | ICD-10-CM | POA: Diagnosis not present

## 2018-01-31 DIAGNOSIS — A419 Sepsis, unspecified organism: Secondary | ICD-10-CM | POA: Diagnosis not present

## 2018-01-31 DIAGNOSIS — Z9181 History of falling: Secondary | ICD-10-CM | POA: Diagnosis not present

## 2018-01-31 NOTE — Progress Notes (Signed)
PCP: Dr. Mancel Bale Cardiology: Dr. Claiborne Billings HF Cardiology: Dr. Aundra Dubin  84 y.o. with history of CKD stage 3, SSS with Medtronic PPM, paroxysmal atrial fibrillation, and chronic diastolic CHF was referred by Dr. Claiborne Billings for evaluation of CHF/pulmonary hypertension. She has had noticeable exertional dyspnea for over a year. It has worsened gradually. Currently, she is short of breath with most ADLs.  Most recent echo in 11/18 showed normal EF with severe LVH, restrictive diastolic function, moderately decreased RV systolic function, and severe pulmonary hypertension.  RHC in 1/19 showed severe PAH and mildly elevated PCWP. V/Q scan showed no chronic PEs and PFTs showed minimal obstructive/restriction.   She was admitted in 3/19 for CHF exacerbation and treated with diuresis.  Weight is down 7 lbs today.  She is now in a SNF for rehab.   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   Underwent DCCV 01/13/18. She presents today for follow up. EKG shows NSR. She is very fatigued. Having trouble sleeping. Tired of taking "16" medications a day.  Son here today. They are not interested in pursuing tafamadis, knowing that it would be unlikely to greatly improve her quality of life. She remains SOB with minimal exertion, including any ADLs. Mild lightheadedness with standing. She has a chronic foley, and son and pt suspect she may be getting another UTI. Has PCP appointment and going to call her urologist. She has not felt better despite DCCV and multiple medication adjustments. With long conversation today, they would like to change the goal of care to comfort, and are interested in palliative care.   6 minute walk (9/19): Only able to walk 2 minutes, 24 m  Labs (9/18): K 4.3, creatinine 1.68 Labs (11/18): TSH normal, LFTs normal, NT-proBNP 4311, hgb 9.7 Labs (1/19): K 4.4, creatinine 1.58, hgb 10 Labs (3/19): K 4.1, creatinine  1.48 => 1.87, hgb 9.5, urine immunofixation negative, ANA negative, anti-Jo1 antibody negative, anti-SCL70 negative, SPEP with faint M-spike, RF negative. LFTs/TSH normal.  Labs (5/19): K 3.2, creatinine 1.68 Labs (7/19): TSH normal, K 4.2, creatinine 1.73, hgb 9.8  PMH: 1. CKD stage 3 2. Right breast cancer.  3. Atrial fibrillation: Paroxysmal.  H/o DCCV.  On amiodarone to maintain NSR.   4. Right TKR 2015 5. Sick sinus syndrome: Medtronic PPM 5/17.  6. Type II diabetes 7. HTN 8. Chronic diastolic CHF: Echo (86/76) with EF 65-70%, severe LVH, restrictive diastolic function, mild MR, mild AI, moderately dilated RV with moderately decreased systolic function, PASP 79 mmHg, severe TR.  - TcPYP scan (2/19): Suggestive of TTR amyloidosis.  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  9. Pulmonary hypertension:  - RHC (1/19): mean RA 8, PA 72/22 mean 41, mean PCWP 20, CI 2.22, PVR 5.4 WU. - PFTs (2/19): minimal obstruction, mild restriction, severely decreased DLCO.  - V/Q scan (2/19): No chronic or acute PE. 10. Severe TR 11. Dementia  Social History   Socioeconomic History  . Marital status: Single    Spouse name: Not on file  . Number of children: 0  . Years of education: 50  . Highest education level: Some college, no degree  Occupational History  . Occupation: retired from [employer]  Social Needs  . Financial resource strain: Not on file  . Food insecurity:    Worry: Not on file    Inability: Not on file  . Transportation needs:    Medical:  Not on file    Non-medical: Not on file  Tobacco Use  . Smoking status: Former Smoker    Packs/day: 0.00    Years: 30.00    Pack years: 0.00    Types: Cigarettes    Last attempt to quit: 04/21/1975    Years since quitting: 42.8  . Smokeless tobacco: Never Used  Substance and Sexual Activity  . Alcohol use: Not Currently    Comment: occasionally  . Drug use: No  . Sexual activity: Not Currently   Lifestyle  . Physical activity:    Days per week: Not on file    Minutes per session: Not on file  . Stress: Not on file  Relationships  . Social connections:    Talks on phone: Not on file    Gets together: Not on file    Attends religious service: Not on file    Active member of club or organization: Not on file    Attends meetings of clubs or organizations: Not on file    Relationship status: Not on file  . Intimate partner violence:    Fear of current or ex partner: Not on file    Emotionally abused: Not on file    Physically abused: Not on file    Forced sexual activity: Not on file  Other Topics Concern  . Not on file  Social History Narrative   Lives alone.  Has a dog.  No children.  Retired from Equities trader.  Education: some college.    Family History  Problem Relation Age of Onset  . Heart disease Mother   . Stroke Mother   . Hypertension Mother   . Stroke Father   . Hypertension Father   . Stroke Brother   . Heart attack Neg Hx   . Neuropathy Neg Hx    Review of systems complete and found to be negative unless listed in HPI.     Current Outpatient Medications  Medication Sig Dispense Refill  . amiodarone (PACERONE) 200 MG tablet Take 0.5 tablets (100 mg total) by mouth daily. 45 tablet 2  . carvedilol (COREG) 3.125 MG tablet TAKE 1 TABLET (3.125 MG TOTAL) BY MOUTH 2 (TWO) TIMES DAILY WITH A MEAL. 60 tablet 3  . donepezil (ARICEPT) 5 MG tablet Take 1 tablet (5 mg total) by mouth at bedtime. 30 tablet 0  . ENSURE (ENSURE) Take 237 mLs by mouth 3 (three) times daily.     . ferrous sulfate 325 (65 FE) MG tablet Take 1 tablet (325 mg total) by mouth every other day. (Patient taking differently: Take 325 mg by mouth every other day. ) 30 tablet 3  . furosemide (LASIX) 80 MG tablet TAKE 1 TABLET BY MOUTH TWICE A DAY FOR EDEMA/HIGH BP (Patient taking differently: Take 80 mg by mouth 2 (two) times daily. ) 60 tablet 3  . HYDROcodone-acetaminophen  (NORCO) 10-325 MG tablet Take 1 tablet by mouth 3 (three) times daily as needed for severe pain.     Marland Kitchen latanoprost (XALATAN) 0.005 % ophthalmic solution Place 1 drop into both eyes at bedtime.    Marland Kitchen LORazepam (ATIVAN) 1 MG tablet Take 1 mg by mouth at bedtime.   2  . macitentan (OPSUMIT) 10 MG tablet Take 1 tablet (10 mg total) by mouth daily. 30 tablet 11  . Melatonin 3 MG TABS Take 9 mg by mouth at bedtime.     Marland Kitchen oxybutynin (DITROPAN) 5 MG tablet Take 5 mg by mouth 2 (  two) times daily.     . polyethylene glycol (MIRALAX / GLYCOLAX) packet Take 17 g by mouth daily. (Patient taking differently: Take 17 g by mouth daily as needed for mild constipation. ) 14 each 0  . potassium chloride SA (K-DUR,KLOR-CON) 20 MEQ tablet Take 1 tablet (20 mEq total) by mouth daily. 90 tablet 3  . sertraline (ZOLOFT) 25 MG tablet Take 1 tablet (25 mg total) by mouth daily. 30 tablet 0  . tadalafil, PAH, (ADCIRCA) 20 MG tablet Take 2 tablets (40 mg total) by mouth daily. 60 tablet 11  . traZODone (DESYREL) 100 MG tablet TAKE 1 TABLET BY MOUTH EVERYDAY AT BEDTIME (Patient taking differently: Take 100 mg by mouth at bedtime. ) 90 tablet 0  . VENTOLIN HFA 108 (90 Base) MCG/ACT inhaler TAKE 2 PUFFS BY MOUTH EVERY 6 HOURS AS NEEDED FOR WHEEZE OR SHORTNESS OF BREATH (Patient taking differently: Inhale 2 puffs into the lungs every 6 (six) hours as needed for wheezing or shortness of breath. ) 1 Inhaler 2  . warfarin (COUMADIN) 4 MG tablet TAKE 1/2 TO 1 TABLET BY MOUTH DAILY AS DIRECTED BY COUMADIN CLINIC 90 tablet 0   No current facility-administered medications for this encounter.    Vitals:   02/01/18 0836  BP: 116/68  Pulse: 67  SpO2: 97%   Wt Readings from Last 3 Encounters:  01/30/18 55.3 kg (122 lb)  01/23/18 58.1 kg (128 lb)  01/16/18 58.1 kg (128 lb)    Physical Exam General: Chronically ill and fatigued appearing. SOB with prolonged conversation.  HEENT: Normal Neck: Supple. JVP 6-7 cm. Carotids 2+ bilat;  no bruits. No thyromegaly or nodule noted. Cor: PMI nondisplaced. RRR, 2/6 HSM LLSB. M/G/R noted Lungs: CTAB, normal effort. Abdomen: Soft, non-tender, non-distended, no HSM. No bruits or masses. +BS  Extremities: No cyanosis, clubbing, or rash. R and LLE no edema.  Neuro: Alert & orientedx3, cranial nerves grossly intact. moves all 4 extremities w/o difficulty. Affect pleasant   Assessment/Plan: 1. Atrial fibrillation: Paroxysmal.   - EKG today shows NSR. - S/p DCCV 01/13/18  - Continue amiodarone for now.  - Continue warfarin. She would like to come off, but discussed the importance of AC at least 4 weeks after DCCV. INR today.  2. Chronic diastolic CHF:  - Echo 98/33 with normal EF, severe LVH, moderate RV systolic function, severe pulmonary hypertension.  Severe LVH in this patient raises concern for either long-standing poorly controlled HTN or cardiac amyloidosis => TcPYP scan was suggestive of transthyretin amyloidosis, genetic testing suggests hereditary TTR amyloidosis with Val142Ile.  Cannot do cardiac MRI because of pacemaker.   - NYHA IIIb symptoms.  - Volume status stable on exam.   - Continue Lasix 80 mg BID. BMET today.   - She is limited but lives on her own still.   - She is not interested in [place]. Brother here today. He will get genetic testing.  3. HTN:  - Continue Coreg 3.125 mg BID.  4. Pulmonary hypertension:  - Severe pulmonary hypertension on echo.  RHC showed pulmonary arterial hypertension.  Concerned for group 1 PH.  V/Q scan with no chronic PE, PFTs not markedly abnormal.  Serological workup was negative. 6 minute walk 12/2017 showed severe limitation.  - Continue Opsumit 10 mg daily.  - Discussed with Dr. Aundra Dubin. With desire to focus on comfort, will stop Adcirca. - Continue home O2 5. CKD: Stage 3 - Cr stable at 1.91 01/11/18 6. Tricuspid regurgitation: -  Moderate on  Echo 11/17/17.  7. Sick sinus syndrome:  - s/p Medtronic PPM.  Stable.  8. Poor  appetite/weight loss:  - Unable to get Megace approved with her insurance.  9. Prognosis - With deconditioning and poor quality of life, pt and family would like to transition focus to comfort and staying at home. They would like to stop any non-essential meds and are very interested in getting palliative care at the home. She verbalizes understanding that she may not get any better, and will likely continue to get worse over time. Discussed goals of care and advanced directive, though she was not ready to make any decision around this today.   RTC 6-8 weeks. Refer to palliative care. Will not pursue tafamadis. Will stop adcirca with focus on reducing pill burden. Sees PCP soon to continue same.   Shirley Friar, PA-C  02/01/2018   Greater than 50% of the 40 minute visit was spent in counseling/coordination of care regarding disease state education, salt/fluid restriction, sliding scale diuretics, and medication compliance.

## 2018-01-31 NOTE — Telephone Encounter (Signed)
I spoke with patient and made sure that she has her appointments straight for the 30th and 31st.

## 2018-02-01 ENCOUNTER — Other Ambulatory Visit (HOSPITAL_COMMUNITY): Payer: Self-pay

## 2018-02-01 ENCOUNTER — Ambulatory Visit: Payer: Medicare HMO | Admitting: Neurology

## 2018-02-01 ENCOUNTER — Encounter (HOSPITAL_COMMUNITY): Payer: Self-pay

## 2018-02-01 ENCOUNTER — Telehealth: Payer: Self-pay

## 2018-02-01 ENCOUNTER — Ambulatory Visit (INDEPENDENT_AMBULATORY_CARE_PROVIDER_SITE_OTHER): Payer: Medicare HMO | Admitting: Pharmacist Clinician (PhC)/ Clinical Pharmacy Specialist

## 2018-02-01 ENCOUNTER — Ambulatory Visit (HOSPITAL_COMMUNITY)
Admission: RE | Admit: 2018-02-01 | Discharge: 2018-02-01 | Disposition: A | Discharge status: Home / Self Care | Payer: Medicare HMO | Source: Ambulatory Visit | Attending: Cardiology | Admitting: Cardiology

## 2018-02-01 VITALS — BP 116/68 | HR 67

## 2018-02-01 DIAGNOSIS — I4821 Permanent atrial fibrillation: Secondary | ICD-10-CM | POA: Diagnosis not present

## 2018-02-01 DIAGNOSIS — E1122 Type 2 diabetes mellitus with diabetic chronic kidney disease: Secondary | ICD-10-CM | POA: Insufficient documentation

## 2018-02-01 DIAGNOSIS — I071 Rheumatic tricuspid insufficiency: Secondary | ICD-10-CM | POA: Insufficient documentation

## 2018-02-01 DIAGNOSIS — I130 Hypertensive heart and chronic kidney disease with heart failure and stage 1 through stage 4 chronic kidney disease, or unspecified chronic kidney disease: Secondary | ICD-10-CM | POA: Insufficient documentation

## 2018-02-01 DIAGNOSIS — Z79899 Other long term (current) drug therapy: Secondary | ICD-10-CM | POA: Insufficient documentation

## 2018-02-01 DIAGNOSIS — I495 Sick sinus syndrome: Secondary | ICD-10-CM | POA: Diagnosis not present

## 2018-02-01 DIAGNOSIS — Z09 Encounter for follow-up examination after completed treatment for conditions other than malignant neoplasm: Secondary | ICD-10-CM | POA: Insufficient documentation

## 2018-02-01 DIAGNOSIS — Z950 Presence of cardiac pacemaker: Secondary | ICD-10-CM | POA: Insufficient documentation

## 2018-02-01 DIAGNOSIS — I2721 Secondary pulmonary arterial hypertension: Secondary | ICD-10-CM | POA: Diagnosis not present

## 2018-02-01 DIAGNOSIS — R69 Illness, unspecified: Secondary | ICD-10-CM | POA: Diagnosis not present

## 2018-02-01 DIAGNOSIS — E852 Heredofamilial amyloidosis, unspecified: Secondary | ICD-10-CM

## 2018-02-01 DIAGNOSIS — I5032 Chronic diastolic (congestive) heart failure: Secondary | ICD-10-CM

## 2018-02-01 DIAGNOSIS — I4891 Unspecified atrial fibrillation: Secondary | ICD-10-CM

## 2018-02-01 DIAGNOSIS — R634 Abnormal weight loss: Secondary | ICD-10-CM | POA: Insufficient documentation

## 2018-02-01 DIAGNOSIS — I10 Essential (primary) hypertension: Secondary | ICD-10-CM | POA: Diagnosis not present

## 2018-02-01 DIAGNOSIS — E851 Neuropathic heredofamilial amyloidosis: Secondary | ICD-10-CM

## 2018-02-01 DIAGNOSIS — Z7901 Long term (current) use of anticoagulants: Secondary | ICD-10-CM | POA: Diagnosis not present

## 2018-02-01 DIAGNOSIS — I119 Hypertensive heart disease without heart failure: Secondary | ICD-10-CM | POA: Diagnosis not present

## 2018-02-01 DIAGNOSIS — Z87891 Personal history of nicotine dependence: Secondary | ICD-10-CM | POA: Diagnosis not present

## 2018-02-01 DIAGNOSIS — Z515 Encounter for palliative care: Secondary | ICD-10-CM

## 2018-02-01 DIAGNOSIS — Z8249 Family history of ischemic heart disease and other diseases of the circulatory system: Secondary | ICD-10-CM | POA: Diagnosis not present

## 2018-02-01 DIAGNOSIS — I480 Paroxysmal atrial fibrillation: Secondary | ICD-10-CM | POA: Insufficient documentation

## 2018-02-01 DIAGNOSIS — G63 Polyneuropathy in diseases classified elsewhere: Secondary | ICD-10-CM

## 2018-02-01 DIAGNOSIS — F0390 Unspecified dementia without behavioral disturbance: Secondary | ICD-10-CM | POA: Diagnosis not present

## 2018-02-01 DIAGNOSIS — N183 Chronic kidney disease, stage 3 (moderate): Secondary | ICD-10-CM | POA: Insufficient documentation

## 2018-02-01 LAB — CBC
HCT: 31.1 % — ABNORMAL LOW (ref 36.0–46.0)
Hemoglobin: 9.5 g/dL — ABNORMAL LOW (ref 12.0–15.0)
MCH: 26.4 pg (ref 26.0–34.0)
MCHC: 30.5 g/dL (ref 30.0–36.0)
MCV: 86.4 fL (ref 80.0–100.0)
NRBC: 0 % (ref 0.0–0.2)
PLATELETS: 267 10*3/uL (ref 150–400)
RBC: 3.6 MIL/uL — AB (ref 3.87–5.11)
RDW: 14 % (ref 11.5–15.5)
WBC: 4.8 10*3/uL (ref 4.0–10.5)

## 2018-02-01 LAB — BASIC METABOLIC PANEL
Anion gap: 12 (ref 5–15)
BUN: 36 mg/dL — ABNORMAL HIGH (ref 8–23)
CALCIUM: 9.3 mg/dL (ref 8.9–10.3)
CO2: 26 mmol/L (ref 22–32)
CREATININE: 1.63 mg/dL — AB (ref 0.44–1.00)
Chloride: 100 mmol/L (ref 98–111)
GFR calc non Af Amer: 27 mL/min — ABNORMAL LOW (ref >60)
GFR, EST AFRICAN AMERICAN: 31 mL/min — AB (ref >60)
Glucose, Bld: 99 mg/dL (ref 70–99)
POTASSIUM: 4.4 mmol/L (ref 3.5–5.1)
Sodium: 138 mmol/L (ref 135–145)

## 2018-02-01 LAB — PROTIME-INR
INR: 2.42
Prothrombin Time: 26 seconds — ABNORMAL HIGH (ref 11.4–15.2)

## 2018-02-01 NOTE — Patient Instructions (Signed)
STOP Adcirca   Labs today We will only contact you if something comes back abnormal or we need to make some changes. Otherwise no news is good news!  You have been referred to [facility] They will be in contact with you to arrange the first home visit  Your physician recommends that you schedule a follow-up appointment in: 6-8 weeks with Dr Aundra Dubin

## 2018-02-01 NOTE — Progress Notes (Signed)
Paramedicine Encounter   Patient ID: Natasha Fletcher , female,   DOB: 24-Mar-1926,84 y.o.,  MRN: [id number]   Met patient in clinic today with provider.    B/P-116/68 P-67 sp02-97%  Pt reports she is not feeling well this morning, she is here with her brother. She is concerned with her meds and which one is making her feel sick and not able to sleep.  It was brought up to have her referred to palliative care services to obtain further assistance and to take a more comfortable approach Jonni Sanger was able to advise which pills she can stop and which ones to speak with her PCP about.  I will go out tomorrow to fix pill box, her INR was sent over to coumadin clinic and I will check to see if any changes will need to be made after the coumadin clinic reviews it.     Marylouise Stacks, [place] 02/01/2018

## 2018-02-01 NOTE — Telephone Encounter (Signed)
Phone call placed to patient's brother to schedule visit with Palliative Care. Visit scheduled for 02/03/18 @ 1pm

## 2018-02-01 NOTE — Addendum Note (Signed)
Encounter addended by: Kerry Dory, CMA on: 02/01/2018 10:56 AM  Actions taken: Charge Capture section accepted

## 2018-02-02 ENCOUNTER — Other Ambulatory Visit (HOSPITAL_COMMUNITY): Payer: Self-pay

## 2018-02-02 NOTE — Progress Notes (Signed)
Came out today for med rec to make those pill dose change of stopping the tadalafil.  Coumadin clinic called and reported no changes.  Palliative care comes out tomor, she goes to PCP on Monday.   Marylouise Stacks, EMT-Paramedic  02/02/18

## 2018-02-03 ENCOUNTER — Other Ambulatory Visit: Payer: Medicare HMO | Admitting: Primary Care

## 2018-02-03 DIAGNOSIS — E440 Moderate protein-calorie malnutrition: Secondary | ICD-10-CM

## 2018-02-03 DIAGNOSIS — I5033 Acute on chronic diastolic (congestive) heart failure: Secondary | ICD-10-CM

## 2018-02-03 DIAGNOSIS — I4811 Longstanding persistent atrial fibrillation: Secondary | ICD-10-CM

## 2018-02-03 DIAGNOSIS — I495 Sick sinus syndrome: Secondary | ICD-10-CM

## 2018-02-03 DIAGNOSIS — Z515 Encounter for palliative care: Secondary | ICD-10-CM

## 2018-02-03 DIAGNOSIS — I5032 Chronic diastolic (congestive) heart failure: Secondary | ICD-10-CM

## 2018-02-03 NOTE — Progress Notes (Signed)
PALLIATIVE CARE CONSULT VISIT   [name] DOB: Aug 25, 1925 MRN: [id number]  PRIMARY CARE PROVIDER:   Lorene Dy, MD  REFERRING PROVIDER:  Lorene Dy, MD [address], [place], [place] [address]  RESPONSIBLE PARTY:    [place]-De[name] Brother [phone]      ASSESSMENT and RECOMMENDATIOn:  Met with patient and brother [facility]) re goals of care. Patient states extreme fatigue, loss of appetite and poor intake, Inability to ambulate due to fatigue and SOB. States she has had hallucinations  Of siblings who have passed on and is "thinking about the past a lot".   Discussed palliative care philosophy and services, and those of hospice. Discussed her wish to stay in her own home. Her sister is also currently a patient of [facility]. Patient also states desire to discontinue medications not indicated for comfort.  1. Cardiac disease: NYHA IV, unable to perform most adls and all I adls. Discomfort at rest. Continue oxygen, obtain assistance for all adls.  2. Pain : from CHF, OA. Takes hydrocodone daily. Recommend ATC Acetaminophen for better pain management, 500 mg bid or tid.  3. Dementia: FAST score 6a-b, States function is declining rapidly and brother concurs.Poor appetite and intake, and reports disturbing hallucinations. Would benefit from more adls assistance. Recommend review of medications and addition of mirtazipine 7.5 mg at hs for appetite stimulation.   4. Goals of Care: Hospice appropriate at this time. Explained services and philosophy of maximizing quality of life and comfort. Patient states she desires this but wants to discuss with her brother and with her physician. Has MD appt on Monday 11/4 and will discuss with MD re hospice referral. Appt made with patient and brother for home visit on Tues 11/5 to decide finally on hospice care, and finalize MOST and DNR forms as needed.  I spent 60  minutes providing this consultation,  from 14:00 to  15:00. More than 50% of the time in this consultation was spent coordinating communication.   HISTORY OF PRESENT ILLNESS:  Natasha Fletcher is a 84 y.o. year old female with multiple medical problems including dementia,  .  chronic diastolic cHF , OA, a fib, chronic coumadin use, h/ o breast ca. Palliative Care was asked to help address goals of care.  Return 11/5 to decide on goals of care.  CODE STATUS:  FC PPS: 40% HOSPICE ELIGIBILITY/DIAGNOSIS: yes  PAST MEDICAL HISTORY:  Past Medical History:  Diagnosis Date  . Arthritis    "all over"  . Asthma   . Atrial fibrillation ([place])    a. s/p DCCV in 01/2014 b. repeat DCCV in 06/2015, started on Amiodarone at that time. On Coumadin for anticoagulation.  . Cancer ([place])    rt breast  . Chronic diastolic CHF (congestive heart failure) ([place])    a. 08/2015: Echo with EF of 60-65%, no WMA, LA mod dilated, mild TR.  . Diabetes mellitus without complication ([place])   . Edema leg   . Fast breathing    "because of pill"  . GERD (gastroesophageal reflux disease)   . Glaucoma   . Glaucoma   . Hypertension   . Incontinence   . Pneumonia    h/o younger  . S/P total knee arthroplasty   . SSS (sick sinus syndrome) ([place])    a. s/p Medtronic Advisa DR MRI A2DR01 1 (serial number [id number]) PPM placement in 08/2015  . Swelling    bilateral feet/ legs, more left.  . Thyroid  disease    "something years ago"  . UTI (urinary tract infection) 02/12/2015  . Wears dentures   . Wears glasses     SOCIAL HX:  Social History   Tobacco Use  . Smoking status: Former Smoker    Packs/day: 0.00    Years: 30.00    Pack years: 0.00    Types: Cigarettes    Last attempt to quit: 04/21/1975    Years since quitting: 42.8  . Smokeless tobacco: Never Used  Substance Use Topics  . Alcohol use: Not Currently    Comment: occasionally    ALLERGIES:  Allergies  Allergen Reactions  . Lactose Intolerance (Gi) Other (See Comments)    Stomach pain  . Sulfa  Antibiotics Itching     PERTINENT MEDICATIONS:  Outpatient Encounter Medications as of 02/03/2018  Medication Sig  . amiodarone (PACERONE) 200 MG tablet Take 0.5 tablets (100 mg total) by mouth daily.  . carvedilol (COREG) 3.125 MG tablet TAKE 1 TABLET (3.125 MG TOTAL) BY MOUTH 2 (TWO) TIMES DAILY WITH A MEAL.  Marland Kitchen donepezil (ARICEPT) 5 MG tablet Take 1 tablet (5 mg total) by mouth at bedtime.  . ENSURE (ENSURE) Take 237 mLs by mouth 3 (three) times daily.   . ferrous sulfate 325 (65 FE) MG tablet Take 1 tablet (325 mg total) by mouth every other day. (Patient taking differently: Take 325 mg by mouth every other day. )  . furosemide (LASIX) 80 MG tablet TAKE 1 TABLET BY MOUTH TWICE A DAY FOR EDEMA/HIGH BP (Patient taking differently: Take 80 mg by mouth 2 (two) times daily. )  . HYDROcodone-acetaminophen (NORCO) 10-325 MG tablet Take 1 tablet by mouth 3 (three) times daily as needed for severe pain.   Marland Kitchen latanoprost (XALATAN) 0.005 % ophthalmic solution Place 1 drop into both eyes at bedtime.  Marland Kitchen LORazepam (ATIVAN) 1 MG tablet Take 1 mg by mouth at bedtime.   . macitentan (OPSUMIT) 10 MG tablet Take 1 tablet (10 mg total) by mouth daily.  . Melatonin 3 MG TABS Take 9 mg by mouth at bedtime.   Marland Kitchen oxybutynin (DITROPAN) 5 MG tablet Take 5 mg by mouth 2 (two) times daily.   . polyethylene glycol (MIRALAX / GLYCOLAX) packet Take 17 g by mouth daily. (Patient taking differently: Take 17 g by mouth daily as needed for mild constipation. )  . potassium chloride SA (K-DUR,KLOR-CON) 20 MEQ tablet Take 1 tablet (20 mEq total) by mouth daily.  . sertraline (ZOLOFT) 25 MG tablet Take 1 tablet (25 mg total) by mouth daily.  . traZODone (DESYREL) 100 MG tablet TAKE 1 TABLET BY MOUTH EVERYDAY AT BEDTIME (Patient taking differently: Take 100 mg by mouth at bedtime. )  . VENTOLIN HFA 108 (90 Base) MCG/ACT inhaler TAKE 2 PUFFS BY MOUTH EVERY 6 HOURS AS NEEDED FOR WHEEZE OR SHORTNESS OF BREATH (Patient taking  differently: Inhale 2 puffs into the lungs every 6 (six) hours as needed for wheezing or shortness of breath. )  . warfarin (COUMADIN) 4 MG tablet TAKE 1/2 TO 1 TABLET BY MOUTH DAILY AS DIRECTED BY COUMADIN CLINIC   No facility-administered encounter medications on file as of 02/03/2018.     PHYSICAL EXAM:  VS: 131/82 97.3-63-18 95% 122 lbs. General: NAD, frail appearing, thin Cardiovascular: regular rate and rhythm, S1S2 Pulmonary: clear all uppe fields, expiratory wheezes bases Abdomen: soft, nontender, + bowel sounds,Endorses constipation GU: no suprapubic tenderness, has foley  And has chronically. Extremities: 1+ edema, no joint deformities Skin: no  rashes, no lesions found Neurological: Weakness, Oriented x 2-3. Forgetful. Endorses insomnia even with trazodone, and would like this addressed.  Cyndia Skeeters DNP, AGPNCP-BC

## 2018-02-06 ENCOUNTER — Other Ambulatory Visit (HOSPITAL_COMMUNITY): Payer: Self-pay

## 2018-02-06 DIAGNOSIS — M545 Low back pain: Secondary | ICD-10-CM | POA: Diagnosis not present

## 2018-02-06 NOTE — Progress Notes (Signed)
Paramedicine Encounter    Patient ID: Natasha Fletcher, female    DOB: [birth year], 84 y.o.   MRN: [id number]   Patient Care Team: Lorene Dy, MD as PCP - General (Internal Medicine) Constance Haw, MD as PCP - Electrophysiology (Cardiology) Troy Sine, MD as PCP - Cardiology (Cardiology)  Patient Active Problem List   Diagnosis Date Noted  . Cognitive decline   . Palliative care by specialist   . DNR (do not resuscitate) discussion   . Acute on chronic congestive heart failure ([place])   . Acute on chronic diastolic CHF (congestive heart failure) ([place])   . Sick sinus syndrome due to SA node dysfunction ([facility])   . Shortness of breath 06/13/2017  . Atrial fibrillation ([place]) 03/22/2016  . Bradycardia 08/22/2015  . Junctional (nodal) bradycardia 08/22/2015  . Malnutrition of moderate degree 03/19/2015  . Benign essential HTN   . Bradycardia, drug induced 03/18/2015  . Hypokalemia 02/16/2015  . Thrombocytopenia ([place]) 02/16/2015  . Sepsis due to urinary tract infection ([place])   . Sepsis ([place]) 02/12/2015  . Urinary tract infectious disease   . Long-term (current) use of anticoagulants 07/12/2014  . Chronic anticoagulation 02/11/2014  . Hypertensive cardiovascular disease 01/24/2014  . Chronic diastolic CHF (congestive heart failure) ([place]) 01/24/2014  . Anemia 01/24/2014  . Breast cancer of upper-inner quadrant of right female breast ([place]) 01/24/2014  . Atrial flutter ([place]) 11/11/2013  . PAF (paroxysmal atrial fibrillation) ([place]) 11/10/2013  . Slurred speech 11/10/2013  . Protein-calorie malnutrition, severe ([place]) 11/06/2013  . Coffee ground emesis 11/05/2013  . Dehydration 10/19/2013  . Urinary retention 10/18/2013  . GERD (gastroesophageal reflux disease) 03/20/2013  . S/P total knee arthroplasty   . UTI (urinary tract infection) 02/21/2013  . OA (osteoarthritis) of knee 02/19/2013  . Hypertension   . Thyroid disease   . Cancer of central portion of female breast  ([place]) 04/14/2011    Current Outpatient Medications:  .  amiodarone (PACERONE) 200 MG tablet, Take 0.5 tablets (100 mg total) by mouth daily., Disp: 45 tablet, Rfl: 2 .  carvedilol (COREG) 3.125 MG tablet, TAKE 1 TABLET (3.125 MG TOTAL) BY MOUTH 2 (TWO) TIMES DAILY WITH A MEAL., Disp: 60 tablet, Rfl: 3 .  donepezil (ARICEPT) 5 MG tablet, Take 1 tablet (5 mg total) by mouth at bedtime., Disp: 30 tablet, Rfl: 0 .  ENSURE (ENSURE), Take 237 mLs by mouth 3 (three) times daily. , Disp: , Rfl:  .  ferrous sulfate 325 (65 FE) MG tablet, Take 1 tablet (325 mg total) by mouth every other day. (Patient taking differently: Take 325 mg by mouth every other day. ), Disp: 30 tablet, Rfl: 3 .  furosemide (LASIX) 80 MG tablet, TAKE 1 TABLET BY MOUTH TWICE A DAY FOR EDEMA/HIGH BP (Patient taking differently: Take 80 mg by mouth 2 (two) times daily. ), Disp: 60 tablet, Rfl: 3 .  HYDROcodone-acetaminophen (NORCO) 10-325 MG tablet, Take 1 tablet by mouth 3 (three) times daily as needed for severe pain. , Disp: , Rfl:  .  latanoprost (XALATAN) 0.005 % ophthalmic solution, Place 1 drop into both eyes at bedtime., Disp: , Rfl:  .  macitentan (OPSUMIT) 10 MG tablet, Take 1 tablet (10 mg total) by mouth daily., Disp: 30 tablet, Rfl: 11 .  Melatonin 3 MG TABS, Take 9 mg by mouth at bedtime. , Disp: , Rfl:  .  oxybutynin (DITROPAN) 5 MG tablet, Take 5 mg by mouth 2 (two) times daily. , Disp: , Rfl:  .  polyethylene glycol (MIRALAX / GLYCOLAX) packet, Take 17 g by mouth daily. (Patient taking differently: Take 17 g by mouth daily as needed for mild constipation. ), Disp: 14 each, Rfl: 0 .  potassium chloride SA (K-DUR,KLOR-CON) 20 MEQ tablet, Take 1 tablet (20 mEq total) by mouth daily., Disp: 90 tablet, Rfl: 3 .  sertraline (ZOLOFT) 25 MG tablet, Take 1 tablet (25 mg total) by mouth daily., Disp: 30 tablet, Rfl: 0 .  traZODone (DESYREL) 100 MG tablet, TAKE 1 TABLET BY MOUTH EVERYDAY AT BEDTIME (Patient taking differently:  Take 100 mg by mouth at bedtime. ), Disp: 90 tablet, Rfl: 0 .  VENTOLIN HFA 108 (90 Base) MCG/ACT inhaler, TAKE 2 PUFFS BY MOUTH EVERY 6 HOURS AS NEEDED FOR WHEEZE OR SHORTNESS OF BREATH (Patient taking differently: Inhale 2 puffs into the lungs every 6 (six) hours as needed for wheezing or shortness of breath. ), Disp: 1 Inhaler, Rfl: 2 .  warfarin (COUMADIN) 4 MG tablet, TAKE 1/2 TO 1 TABLET BY MOUTH DAILY AS DIRECTED BY COUMADIN CLINIC, Disp: 90 tablet, Rfl: 0 .  LORazepam (ATIVAN) 1 MG tablet, Take 1 mg by mouth at bedtime. , Disp: , Rfl: 2 Allergies  Allergen Reactions  . Lactose Intolerance (Gi) Other (See Comments)    Stomach pain  . Sulfa Antibiotics Itching      Social History   Socioeconomic History  . Marital status: Single    Spouse name: Not on file  . Number of children: 0  . Years of education: 52  . Highest education level: Some college, no degree  Occupational History  . Occupation: retired from [employer]  Social Needs  . Financial resource strain: Not on file  . Food insecurity:    Worry: Not on file    Inability: Not on file  . Transportation needs:    Medical: Not on file    Non-medical: Not on file  Tobacco Use  . Smoking status: Former Smoker    Packs/day: 0.00    Years: 30.00    Pack years: 0.00    Types: Cigarettes    Last attempt to quit: 04/21/1975    Years since quitting: 42.8  . Smokeless tobacco: Never Used  Substance and Sexual Activity  . Alcohol use: Not Currently    Comment: occasionally  . Drug use: No  . Sexual activity: Not Currently  Lifestyle  . Physical activity:    Days per week: Not on file    Minutes per session: Not on file  . Stress: Not on file  Relationships  . Social connections:    Talks on phone: Not on file    Gets together: Not on file    Attends religious service: Not on file    Active member of club or organization: Not on file    Attends meetings of clubs or organizations: Not on file     Relationship status: Not on file  . Intimate partner violence:    Fear of current or ex partner: Not on file    Emotionally abused: Not on file    Physically abused: Not on file    Forced sexual activity: Not on file  Other Topics Concern  . Not on file  Social History Narrative   Lives alone.  Has a dog.  No children.  Retired from [employer].  Education: some college.     Physical Exam      Future Appointments  Date Time Provider [place]  02/07/2018  1:00 PM Estevan Oaks, NP [facility] None  02/20/2018  8:15 AM [facility]  03/08/2018  8:45 AM Camnitz, Ocie Doyne, MD [facility]  03/21/2018  7:40 AM [facility] DEVICE REMOTES [facility]  04/03/2018 10:30 AM Larey Dresser, MD [facility] None  05/08/2018  1:30 PM Posey Pronto, Donika K, DO [facility] None    BP (!) 108/52   Pulse 68   Resp 15   Wt 121 lb (54.9 kg)   SpO2 98%   BMI 20.77 kg/m   Weight yesterday-121 Last visit weight-122  Pt called me this morning to confirm time for me coming out today, she was going to PCP this afternoon, brother called me today stating she couldn't go at 230 and it got resch to 415. So I will have to come back out tomor for any med changes done today at her PCP. Palliative care nurse will be back tomor at 1 for further discussion. I will meet back here at 1 as well for coordination of care with permission of pt and her brother.  Meds verified and pill box refilled as directed for now-any changes will be made tomor.  Pt was obviously sob when she answered the door, she did not look like she felt good, she came to door in pajamas-she is normally fully dressed when I arrive.  Weight is steadily decreasing as well. Called in potassium but it was already ready for pick up.   Marylouise Stacks, [facility] [facility] Paramedic  02/06/18

## 2018-02-07 ENCOUNTER — Other Ambulatory Visit (HOSPITAL_COMMUNITY): Payer: Self-pay | Admitting: Cardiology

## 2018-02-07 ENCOUNTER — Other Ambulatory Visit (HOSPITAL_COMMUNITY): Payer: Self-pay

## 2018-02-07 ENCOUNTER — Other Ambulatory Visit: Payer: Medicare HMO | Admitting: Primary Care

## 2018-02-07 DIAGNOSIS — Z515 Encounter for palliative care: Secondary | ICD-10-CM | POA: Diagnosis not present

## 2018-02-07 DIAGNOSIS — E440 Moderate protein-calorie malnutrition: Secondary | ICD-10-CM

## 2018-02-07 DIAGNOSIS — I5032 Chronic diastolic (congestive) heart failure: Secondary | ICD-10-CM | POA: Diagnosis not present

## 2018-02-07 DIAGNOSIS — I482 Chronic atrial fibrillation, unspecified: Secondary | ICD-10-CM

## 2018-02-07 NOTE — Progress Notes (Signed)
Came by today for med rec after her PCP appointment yesterday. Dr Mancel Bale agreed with all the med changes to d/c-it is ok to stop the aricept,zoloft, ferrous sulfate, oxubutynin and also to stop the trazadone.  Also came by today to meet palliative care nurse/NP on pts plan of care and to coordinate with her.  Palliative care NP advised she could take tylenol morning and evening to help with the pain. That will be added BID.  She will be adding mirtazapine as well for appetite take at night for 7.5mg .  Palliative care NP will send out for hospice, she will get a nurse, aide and SW thru hospice.  Although Jonni Sanger advised she could stop the potassium, nurse wanted to keep potassium on for right now, hospice nurse may make more changes down the road.   Marylouise Stacks, EMT-Paramedic  02/07/18

## 2018-02-07 NOTE — Progress Notes (Signed)
PALLIATIVE CARE CONSULT VISIT   [name] DOB: 12/11/1925 MRN: [id number]  PRIMARY CARE PROVIDER:   Lorene Dy, MD  REFERRING PROVIDER:  Lorene Dy, MD [address] [place], [place] [address]   Loralie Champagne, MD  RESPONSIBLE PARTY:   Self and [place] Gum Brother [phone]      ASSESSMENT and RECOMMENDATIONS: 1. Functionality: Decreasing intake and stamina. Mirtazipine 7.5 mg every evening to stimulate appetite and encourage sleep.  2. Goals of Care: Hospice referral requested of Einar Crow, MD. MOST form completed and left in home. Patient discussion continued re hospice election and advance directives. Patient chose her brother as POA; her sister had been named and she now has dementia. Patient desires hospice admission for criteria end stage heart disease. Referral requested and verbal obtained from office of Dr  Aundra Dubin, and the office stated he would follow. PCP requested this as well. Will fax referral this pm.  I spent 60 minutes providing this consultation,  from 13:00 to 14:00. More than 50% of the time in this consultation was spent coordinating communication.   HISTORY OF PRESENT ILLNESS:  Natasha Fletcher is a 84 y.o. year old female with multiple medical problems including CHF, a fib, dementia, OA. Palliative Care was asked to help address goals of care.   CODE STATUS: MOST signed today and left in home.  PPS: 30% HOSPICE ELIGIBILITY/DIAGNOSIS: yes  PAST MEDICAL HISTORY:  Past Medical History:  Diagnosis Date  . Arthritis    "all over"  . Asthma   . Atrial fibrillation ([place])    a. s/p DCCV in 01/2014 b. repeat DCCV in 06/2015, started on Amiodarone at that time. On Coumadin for anticoagulation.  . Cancer ([place])    rt breast  . Chronic diastolic CHF (congestive heart failure) ([place])    a. 08/2015: Echo with EF of 60-65%, no WMA, LA mod dilated, mild TR.  . Diabetes mellitus without complication ([place])   . Edema leg     . Fast breathing    "because of pill"  . GERD (gastroesophageal reflux disease)   . Glaucoma   . Glaucoma   . Hypertension   . Incontinence   . Pneumonia    h/o younger  . S/P total knee arthroplasty   . SSS (sick sinus syndrome) ([place])    a. s/p Medtronic Advisa DR MRI A2DR01 1 (serial number [id number]) PPM placement in 08/2015  . Swelling    bilateral feet/ legs, more left.  . Thyroid disease    "something years ago"  . UTI (urinary tract infection) 02/12/2015  . Wears dentures   . Wears glasses     SOCIAL HX:  Social History   Tobacco Use  . Smoking status: Former Smoker    Packs/day: 0.00    Years: 30.00    Pack years: 0.00    Types: Cigarettes    Last attempt to quit: 04/21/1975    Years since quitting: 42.8  . Smokeless tobacco: Never Used  Substance Use Topics  . Alcohol use: Not Currently    Comment: occasionally    ALLERGIES:  Allergies  Allergen Reactions  . Lactose Intolerance (Gi) Other (See Comments)    Stomach pain  . Sulfa Antibiotics Itching     PERTINENT MEDICATIONS:  Outpatient Encounter Medications as of 02/07/2018  Medication Sig  . amiodarone (PACERONE) 200 MG tablet Take 0.5 tablets (100 mg total) by mouth daily.  . carvedilol (COREG) 3.125 MG tablet TAKE  1 TABLET (3.125 MG TOTAL) BY MOUTH 2 (TWO) TIMES DAILY WITH A MEAL.  Marland Kitchen donepezil (ARICEPT) 5 MG tablet Take 1 tablet (5 mg total) by mouth at bedtime.  . ENSURE (ENSURE) Take 237 mLs by mouth 3 (three) times daily.   . ferrous sulfate 325 (65 FE) MG tablet Take 1 tablet (325 mg total) by mouth every other day. (Patient taking differently: Take 325 mg by mouth every other day. )  . furosemide (LASIX) 80 MG tablet TAKE 1 TABLET BY MOUTH TWICE A DAY FOR EDEMA/HIGH BP (Patient taking differently: Take 80 mg by mouth 2 (two) times daily. )  . HYDROcodone-acetaminophen (NORCO) 10-325 MG tablet Take 1 tablet by mouth 3 (three) times daily as needed for severe pain.   Marland Kitchen latanoprost (XALATAN)  0.005 % ophthalmic solution Place 1 drop into both eyes at bedtime.  Marland Kitchen LORazepam (ATIVAN) 1 MG tablet Take 1 mg by mouth at bedtime.   . macitentan (OPSUMIT) 10 MG tablet Take 1 tablet (10 mg total) by mouth daily.  . Melatonin 3 MG TABS Take 9 mg by mouth at bedtime.   Marland Kitchen oxybutynin (DITROPAN) 5 MG tablet Take 5 mg by mouth 2 (two) times daily.   . polyethylene glycol (MIRALAX / GLYCOLAX) packet Take 17 g by mouth daily. (Patient taking differently: Take 17 g by mouth daily as needed for mild constipation. )  . potassium chloride SA (K-DUR,KLOR-CON) 20 MEQ tablet Take 1 tablet (20 mEq total) by mouth daily.  . sertraline (ZOLOFT) 25 MG tablet Take 1 tablet (25 mg total) by mouth daily.  . traZODone (DESYREL) 100 MG tablet TAKE 1 TABLET BY MOUTH EVERYDAY AT BEDTIME (Patient taking differently: Take 100 mg by mouth at bedtime. )  . VENTOLIN HFA 108 (90 Base) MCG/ACT inhaler TAKE 2 PUFFS BY MOUTH EVERY 6 HOURS AS NEEDED FOR WHEEZE OR SHORTNESS OF BREATH (Patient taking differently: Inhale 2 puffs into the lungs every 6 (six) hours as needed for wheezing or shortness of breath. )  . warfarin (COUMADIN) 4 MG tablet TAKE 1/2 TO 1 TABLET BY MOUTH DAILY AS DIRECTED BY COUMADIN CLINIC   No facility-administered encounter medications on file as of 02/07/2018.     PHYSICAL EXAM:  VS 97.8-60-21  94/51 reported from MD 124 lb.96% 2 Litres General: NAD, frail appearing, thin Cardiovascular: S1 S2 S4, denies angina Pulmonary: clear all  fields Abdomen: soft, nontender, + bowel sounds. Endorse occasional constipation GU: no suprapubic tenderness. Indwelling catheter for retention. Extremities: no edema, no joint deformities Skin: no rashes, wounds. Neurological: Weakness but otherwise nonfocal. Forgetful at times but has capacity to participate in decision making.  Cyndia Skeeters, DNP, AGPCNP-BC

## 2018-02-13 ENCOUNTER — Telehealth (HOSPITAL_COMMUNITY): Payer: Self-pay

## 2018-02-13 ENCOUNTER — Other Ambulatory Visit (HOSPITAL_COMMUNITY): Payer: Self-pay

## 2018-02-13 NOTE — Telephone Encounter (Signed)
   Please call back. Stop lasix and potassium.  Please verify she is a Hospice patient.    Amy Clegg NP-C  4:34 PM

## 2018-02-13 NOTE — Progress Notes (Signed)
Paramedicine Encounter    Patient ID: Natasha Fletcher, female    DOB: [birth year], 84 y.o.   MRN: [id number]   Patient Care Team: Lorene Dy, MD as PCP - General (Internal Medicine) Constance Haw, MD as PCP - Electrophysiology (Cardiology) Troy Sine, MD as PCP - Cardiology (Cardiology)  Patient Active Problem List   Diagnosis Date Noted  . Cognitive decline   . Palliative care by specialist   . DNR (do not resuscitate) discussion   . Acute on chronic congestive heart failure ([place])   . Acute on chronic diastolic CHF (congestive heart failure) ([place])   . Sick sinus syndrome due to SA node dysfunction (HCC)   . Shortness of breath 06/13/2017  . Atrial fibrillation ([place]) 03/22/2016  . Bradycardia 08/22/2015  . Junctional (nodal) bradycardia 08/22/2015  . Malnutrition of moderate degree 03/19/2015  . Benign essential HTN   . Bradycardia, drug induced 03/18/2015  . Hypokalemia 02/16/2015  . Thrombocytopenia ([place]) 02/16/2015  . Sepsis due to urinary tract infection ([place])   . Sepsis ([place]) 02/12/2015  . Urinary tract infectious disease   . Long-term (current) use of anticoagulants 07/12/2014  . Chronic anticoagulation 02/11/2014  . Hypertensive cardiovascular disease 01/24/2014  . Chronic diastolic CHF (congestive heart failure) ([place]) 01/24/2014  . Anemia 01/24/2014  . Breast cancer of upper-inner quadrant of right female breast ([place]) 01/24/2014  . Atrial flutter ([place]) 11/11/2013  . PAF (paroxysmal atrial fibrillation) ([place]) 11/10/2013  . Slurred speech 11/10/2013  . Protein-calorie malnutrition, severe ([place]) 11/06/2013  . Coffee ground emesis 11/05/2013  . Dehydration 10/19/2013  . Urinary retention 10/18/2013  . GERD (gastroesophageal reflux disease) 03/20/2013  . S/P total knee arthroplasty   . UTI (urinary tract infection) 02/21/2013  . OA (osteoarthritis) of knee 02/19/2013  . Hypertension   . Thyroid disease   . Cancer of central portion of female breast  ([place]) 04/14/2011    Current Outpatient Medications:  .  amiodarone (PACERONE) 200 MG tablet, Take 0.5 tablets (100 mg total) by mouth daily., Disp: 45 tablet, Rfl: 2 .  carvedilol (COREG) 3.125 MG tablet, TAKE 1 TABLET (3.125 MG TOTAL) BY MOUTH 2 (TWO) TIMES DAILY WITH A MEAL., Disp: 60 tablet, Rfl: 3 .  ENSURE (ENSURE), Take 237 mLs by mouth 3 (three) times daily. , Disp: , Rfl:  .  HYDROcodone-acetaminophen (NORCO) 10-325 MG tablet, Take 1 tablet by mouth 3 (three) times daily as needed for severe pain. , Disp: , Rfl:  .  latanoprost (XALATAN) 0.005 % ophthalmic solution, Place 1 drop into both eyes at bedtime., Disp: , Rfl:  .  LORazepam (ATIVAN) 1 MG tablet, Take 1 mg by mouth at bedtime. , Disp: , Rfl: 2 .  macitentan (OPSUMIT) 10 MG tablet, Take 1 tablet (10 mg total) by mouth daily., Disp: 30 tablet, Rfl: 11 .  mirtazapine (REMERON) 7.5 MG tablet, Take 7.5 mg by mouth at bedtime., Disp: , Rfl:  .  potassium chloride SA (K-DUR,KLOR-CON) 20 MEQ tablet, Take 1 tablet (20 mEq total) by mouth daily., Disp: 90 tablet, Rfl: 3 .  VENTOLIN HFA 108 (90 Base) MCG/ACT inhaler, TAKE 2 PUFFS BY MOUTH EVERY 6 HOURS AS NEEDED FOR WHEEZE OR SHORTNESS OF BREATH (Patient taking differently: Inhale 2 puffs into the lungs every 6 (six) hours as needed for wheezing or shortness of breath. ), Disp: 1 Inhaler, Rfl: 2 .  warfarin (COUMADIN) 4 MG tablet, TAKE 1/2 TO 1 TABLET BY MOUTH DAILY AS DIRECTED BY COUMADIN CLINIC, Disp: 90  tablet, Rfl: 0 .  donepezil (ARICEPT) 5 MG tablet, Take 1 tablet (5 mg total) by mouth at bedtime. (Patient not taking: Reported on 02/13/2018), Disp: 30 tablet, Rfl: 0 .  ferrous sulfate 325 (65 FE) MG tablet, Take 1 tablet (325 mg total) by mouth every other day. (Patient not taking: Reported on 02/13/2018), Disp: 30 tablet, Rfl: 3 .  furosemide (LASIX) 80 MG tablet, TAKE 1 TABLET BY MOUTH TWICE A DAY FOR EDEMA/HIGH BP (Patient taking differently: Take 80 mg by mouth 2 (two) times daily. ),  Disp: 60 tablet, Rfl: 3 .  Melatonin 3 MG TABS, Take 9 mg by mouth at bedtime. , Disp: , Rfl:  .  oxybutynin (DITROPAN) 5 MG tablet, Take 5 mg by mouth 2 (two) times daily. , Disp: , Rfl:  .  polyethylene glycol (MIRALAX / GLYCOLAX) packet, Take 17 g by mouth daily. (Patient not taking: Reported on 02/13/2018), Disp: 14 each, Rfl: 0 .  sertraline (ZOLOFT) 25 MG tablet, Take 1 tablet (25 mg total) by mouth daily. (Patient not taking: Reported on 02/13/2018), Disp: 30 tablet, Rfl: 0 .  traZODone (DESYREL) 100 MG tablet, TAKE 1 TABLET BY MOUTH EVERYDAY AT BEDTIME (Patient not taking: No sig reported), Disp: 90 tablet, Rfl: 0 Allergies  Allergen Reactions  . Lactose Intolerance (Gi) Other (See Comments)    Stomach pain  . Sulfa Antibiotics Itching      Social History   Socioeconomic History  . Marital status: Single    Spouse name: Not on file  . Number of children: 0  . Years of education: 5  . Highest education level: Some college, no degree  Occupational History  . Occupation: retired from [employer]  Social Needs  . Financial resource strain: Not on file  . Food insecurity:    Worry: Not on file    Inability: Not on file  . Transportation needs:    Medical: Not on file    Non-medical: Not on file  Tobacco Use  . Smoking status: Former Smoker    Packs/day: 0.00    Years: 30.00    Pack years: 0.00    Types: Cigarettes    Last attempt to quit: 04/21/1975    Years since quitting: 42.8  . Smokeless tobacco: Never Used  Substance and Sexual Activity  . Alcohol use: Not Currently    Comment: occasionally  . Drug use: No  . Sexual activity: Not Currently  Lifestyle  . Physical activity:    Days per week: Not on file    Minutes per session: Not on file  . Stress: Not on file  Relationships  . Social connections:    Talks on phone: Not on file    Gets together: Not on file    Attends religious service: Not on file    Active member of club or organization:  Not on file    Attends meetings of clubs or organizations: Not on file    Relationship status: Not on file  . Intimate partner violence:    Fear of current or ex partner: Not on file    Emotionally abused: Not on file    Physically abused: Not on file    Forced sexual activity: Not on file  Other Topics Concern  . Not on file  Social History Narrative   Lives alone.  Has a dog.  No children.  Retired from [employer].  Education: some college.     Physical Exam  Future Appointments  Date Time Provider [place]  02/20/2018  8:15 AM [facility]  03/08/2018  8:45 AM Constance Haw, MD [facility]  03/21/2018  7:40 AM [facility] DEVICE REMOTES [facility]  04/03/2018 10:30 AM Larey Dresser, MD [facility] None  05/08/2018  1:30 PM Posey Pronto, Donika K, DO [facility] None    BP 122/68   Resp 15   Wt 119 lb (54 kg)   BMI 20.43 kg/m   Weight yesterday-? Last visit weight-121  Pt reports she is doing ok,  The LCSW and chaplain from hospice is here as well to meet with patient.  The nurse will be here later this afternoon, I filled pill box as what we have listed, the nurse will check and make any changes if anything was changed since hospice admission and she will leave me a note as well if any changes. **her nurse was able to make it today during our visit** No real changes since some meds were taken off her list, she is having bad days mostly in the morning time about every other day.  She was having lower abd pain this morning.  Her mirtazapine was started a few days ago.  Hospice is able to check her INR at home along with her foley bag change so she doesn't have to go to doc for that either.  Senna will be added as well. Family will p/u and place one tab in the morning.  Pt reports she fell last night due to her getting clothes out of her closet. Nurse advised her to use walker and I  suggested a life alert should she fall and unable to get to phone to call her brother for help.  Nurse concerned with increased concentration of urine and weight loss with decreased appetite and decreased fluid intake and wants to speak to clinic about decreasing her furosemide. Nurse will come back out later this week and can make med adjustments if needed.    Marylouise Stacks, [facility] Paramedic  02/13/18

## 2018-02-13 NOTE — Telephone Encounter (Signed)
Chrisleyn from Hospice called about pt having really dark (almost black) urine. Pt's family states that pt's fluid input has decreased. Chrisleyn also states that pt has lost 20 lbs in the last 3 months and believes pt is dehydrated. Please advise.

## 2018-02-14 NOTE — Telephone Encounter (Signed)
   Please call and set up follow up next week.   Given she is not Hospice she will need to be seen. She will need BMET at that time.   Thanks   NP-C  10:04 AM

## 2018-02-14 NOTE — Telephone Encounter (Signed)
Pt notified. Pt will d/c lasix and K. Pt states she is palliative care.

## 2018-02-15 ENCOUNTER — Telehealth (HOSPITAL_COMMUNITY): Payer: Self-pay | Admitting: Surgery

## 2018-02-15 NOTE — Telephone Encounter (Signed)
I contacted Natasha Fletcher and scheduled follow-up for Tuesday 19th at 12 noon with BMET

## 2018-02-16 NOTE — Telephone Encounter (Signed)
Verified pt's hospice care through [facility]. Pt is receiving hospice.

## 2018-02-20 ENCOUNTER — Other Ambulatory Visit (HOSPITAL_COMMUNITY): Payer: Self-pay

## 2018-02-20 ENCOUNTER — Telehealth: Payer: Self-pay | Admitting: Cardiology

## 2018-02-20 LAB — POCT INR: INR: 2 (ref 2.0–3.0)

## 2018-02-20 NOTE — Telephone Encounter (Signed)
Error

## 2018-02-20 NOTE — Progress Notes (Signed)
Paramedicine Encounter    Patient ID: Natasha Fletcher, female    DOB: [birth year], 84 y.o.   MRN: [id number]   Patient Care Team: Lorene Dy, MD as PCP - General (Internal Medicine) Constance Haw, MD as PCP - Electrophysiology (Cardiology) Troy Sine, MD as PCP - Cardiology (Cardiology)  Patient Active Problem List   Diagnosis Date Noted  . Cognitive decline   . Palliative care by specialist   . DNR (do not resuscitate) discussion   . Acute on chronic congestive heart failure ([place])   . Acute on chronic diastolic CHF (congestive heart failure) ([place])   . Sick sinus syndrome due to SA node dysfunction (HCC)   . Shortness of breath 06/13/2017  . Atrial fibrillation ([place]) 03/22/2016  . Bradycardia 08/22/2015  . Junctional (nodal) bradycardia 08/22/2015  . Malnutrition of moderate degree 03/19/2015  . Benign essential HTN   . Bradycardia, drug induced 03/18/2015  . Hypokalemia 02/16/2015  . Thrombocytopenia ([place]) 02/16/2015  . Sepsis due to urinary tract infection ([place])   . Sepsis ([place]) 02/12/2015  . Urinary tract infectious disease   . Long-term (current) use of anticoagulants 07/12/2014  . Chronic anticoagulation 02/11/2014  . Hypertensive cardiovascular disease 01/24/2014  . Chronic diastolic CHF (congestive heart failure) ([place]) 01/24/2014  . Anemia 01/24/2014  . Breast cancer of upper-inner quadrant of right female breast ([place]) 01/24/2014  . Atrial flutter ([place]) 11/11/2013  . PAF (paroxysmal atrial fibrillation) ([place]) 11/10/2013  . Slurred speech 11/10/2013  . Protein-calorie malnutrition, severe ([place]) 11/06/2013  . Coffee ground emesis 11/05/2013  . Dehydration 10/19/2013  . Urinary retention 10/18/2013  . GERD (gastroesophageal reflux disease) 03/20/2013  . S/P total knee arthroplasty   . UTI (urinary tract infection) 02/21/2013  . OA (osteoarthritis) of knee 02/19/2013  . Hypertension   . Thyroid disease   . Cancer of central portion of female breast  ([place]) 04/14/2011    Current Outpatient Medications:  .  amiodarone (PACERONE) 200 MG tablet, Take 0.5 tablets (100 mg total) by mouth daily., Disp: 45 tablet, Rfl: 2 .  carvedilol (COREG) 3.125 MG tablet, TAKE 1 TABLET (3.125 MG TOTAL) BY MOUTH 2 (TWO) TIMES DAILY WITH A MEAL., Disp: 60 tablet, Rfl: 3 .  donepezil (ARICEPT) 5 MG tablet, Take 1 tablet (5 mg total) by mouth at bedtime. (Patient not taking: Reported on 02/13/2018), Disp: 30 tablet, Rfl: 0 .  ENSURE (ENSURE), Take 237 mLs by mouth 3 (three) times daily. , Disp: , Rfl:  .  ferrous sulfate 325 (65 FE) MG tablet, Take 1 tablet (325 mg total) by mouth every other day. (Patient not taking: Reported on 02/13/2018), Disp: 30 tablet, Rfl: 3 .  furosemide (LASIX) 80 MG tablet, TAKE 1 TABLET BY MOUTH TWICE A DAY FOR EDEMA/HIGH BP (Patient taking differently: Take 80 mg by mouth 2 (two) times daily. ), Disp: 60 tablet, Rfl: 3 .  HYDROcodone-acetaminophen (NORCO) 10-325 MG tablet, Take 1 tablet by mouth 3 (three) times daily as needed for severe pain. , Disp: , Rfl:  .  latanoprost (XALATAN) 0.005 % ophthalmic solution, Place 1 drop into both eyes at bedtime., Disp: , Rfl:  .  LORazepam (ATIVAN) 1 MG tablet, Take 1 mg by mouth at bedtime. , Disp: , Rfl: 2 .  macitentan (OPSUMIT) 10 MG tablet, Take 1 tablet (10 mg total) by mouth daily., Disp: 30 tablet, Rfl: 11 .  Melatonin 3 MG TABS, Take 9 mg by mouth at bedtime. , Disp: , Rfl:  .  mirtazapine (REMERON) 7.5 MG tablet, Take 7.5 mg by mouth at bedtime., Disp: , Rfl:  .  oxybutynin (DITROPAN) 5 MG tablet, Take 5 mg by mouth 2 (two) times daily. , Disp: , Rfl:  .  polyethylene glycol (MIRALAX / GLYCOLAX) packet, Take 17 g by mouth daily. (Patient not taking: Reported on 02/13/2018), Disp: 14 each, Rfl: 0 .  potassium chloride SA (K-DUR,KLOR-CON) 20 MEQ tablet, Take 1 tablet (20 mEq total) by mouth daily., Disp: 90 tablet, Rfl: 3 .  sertraline (ZOLOFT) 25 MG tablet, Take 1 tablet (25 mg total) by  mouth daily. (Patient not taking: Reported on 02/13/2018), Disp: 30 tablet, Rfl: 0 .  traZODone (DESYREL) 100 MG tablet, TAKE 1 TABLET BY MOUTH EVERYDAY AT BEDTIME (Patient not taking: No sig reported), Disp: 90 tablet, Rfl: 0 .  VENTOLIN HFA 108 (90 Base) MCG/ACT inhaler, TAKE 2 PUFFS BY MOUTH EVERY 6 HOURS AS NEEDED FOR WHEEZE OR SHORTNESS OF BREATH (Patient taking differently: Inhale 2 puffs into the lungs every 6 (six) hours as needed for wheezing or shortness of breath. ), Disp: 1 Inhaler, Rfl: 2 .  warfarin (COUMADIN) 4 MG tablet, TAKE 1/2 TO 1 TABLET BY MOUTH DAILY AS DIRECTED BY COUMADIN CLINIC, Disp: 90 tablet, Rfl: 0 Allergies  Allergen Reactions  . Lactose Intolerance (Gi) Other (See Comments)    Stomach pain  . Sulfa Antibiotics Itching      Social History   Socioeconomic History  . Marital status: Single    Spouse name: Not on file  . Number of children: 0  . Years of education: 64  . Highest education level: Some college, no degree  Occupational History  . Occupation: retired from [employer]  Social Needs  . Financial resource strain: Not on file  . Food insecurity:    Worry: Not on file    Inability: Not on file  . Transportation needs:    Medical: Not on file    Non-medical: Not on file  Tobacco Use  . Smoking status: Former Smoker    Packs/day: 0.00    Years: 30.00    Pack years: 0.00    Types: Cigarettes    Last attempt to quit: 04/21/1975    Years since quitting: 42.8  . Smokeless tobacco: Never Used  Substance and Sexual Activity  . Alcohol use: Not Currently    Comment: occasionally  . Drug use: No  . Sexual activity: Not Currently  Lifestyle  . Physical activity:    Days per week: Not on file    Minutes per session: Not on file  . Stress: Not on file  Relationships  . Social connections:    Talks on phone: Not on file    Gets together: Not on file    Attends religious service: Not on file    Active member of club or  organization: Not on file    Attends meetings of clubs or organizations: Not on file    Relationship status: Not on file  . Intimate partner violence:    Fear of current or ex partner: Not on file    Emotionally abused: Not on file    Physically abused: Not on file    Forced sexual activity: Not on file  Other Topics Concern  . Not on file  Social History Narrative   Lives alone.  Has a dog.  No children.  Retired from [employer].  Education: some college.     Physical Exam  Constitutional: She appears well-developed.  Eyes: Pupils are equal, round, and reactive to light.  Neck: Normal range of motion. Neck supple.  Cardiovascular: Normal rate.  Pulmonary/Chest: Effort normal and breath sounds normal.  Abdominal: Soft.  Lower abdominal pain.   Musculoskeletal: Normal range of motion. She exhibits no edema.  Neurological: She is alert.  Skin: Skin is warm and dry.        Future Appointments  Date Time Provider [place]  03/08/2018  8:45 AM Constance Haw, MD [facility]  03/21/2018  7:40 AM [facility] DEVICE REMOTES [facility]  04/03/2018 10:30 AM Larey Dresser, MD [facility] None  05/08/2018  1:30 PM Posey Pronto, Delena Serve, DO [facility] None    Wt 119 lb (54 kg)   BMI 20.43 kg/m   Weight yesterday-119 Last visit weight-119  Arrived to find patient sitting upright in a chair alert and oriented in good spirits. Patient's son reports patient having abdominal pain since Friday. Patient reports pain in the lower abdomen. Patient's nurse advised patient to take a stool softener(twice daily) for constipation. Patient reports since taking the stool softener and her normal mediations she has been feeling better. Patient reports increase in appetite and in bowel movements. Patient's medications reviewed and confirmed via Epic. Pill box filled. Vitals obtained and exam gathered. Confirmed home visit for one week out. Home visit  complete.    Marylouise Stacks, [facility] Paramedic  02/20/18

## 2018-02-21 ENCOUNTER — Inpatient Hospital Stay (HOSPITAL_COMMUNITY): Admission: RE | Admit: 2018-02-21 | Payer: Medicare HMO | Source: Ambulatory Visit

## 2018-02-21 ENCOUNTER — Ambulatory Visit (INDEPENDENT_AMBULATORY_CARE_PROVIDER_SITE_OTHER): Payer: Medicare HMO | Admitting: Pharmacist Clinician (PhC)/ Clinical Pharmacy Specialist

## 2018-02-21 ENCOUNTER — Telehealth (HOSPITAL_COMMUNITY): Payer: Self-pay | Admitting: Cardiology

## 2018-02-21 DIAGNOSIS — I482 Chronic atrial fibrillation, unspecified: Secondary | ICD-10-CM | POA: Diagnosis not present

## 2018-02-21 DIAGNOSIS — Z7901 Long term (current) use of anticoagulants: Secondary | ICD-10-CM | POA: Diagnosis not present

## 2018-02-21 MED ORDER — LORAZEPAM 1 MG PO TABS: 0.5000 mg | ORAL_TABLET | ORAL | 2 refills | Status: AC | PRN

## 2018-02-21 NOTE — Telephone Encounter (Signed)
[facility] called to request refill of ativan. Per Dr Aundra Dubin ok to refill despite cardiology not normally following during hospice duration.  Refill faxed to [facility]

## 2018-02-24 ENCOUNTER — Ambulatory Visit (INDEPENDENT_AMBULATORY_CARE_PROVIDER_SITE_OTHER): Payer: Medicare HMO | Admitting: Pharmacist

## 2018-02-24 DIAGNOSIS — I482 Chronic atrial fibrillation, unspecified: Secondary | ICD-10-CM

## 2018-02-24 LAB — POCT INR: INR: 1.4 — AB (ref 2.0–3.0)

## 2018-02-27 ENCOUNTER — Telehealth (HOSPITAL_COMMUNITY): Payer: Self-pay

## 2018-02-27 ENCOUNTER — Other Ambulatory Visit (HOSPITAL_COMMUNITY): Payer: Self-pay

## 2018-02-27 NOTE — Telephone Encounter (Signed)
I called pt this morning to sch home visit today, her brother answered the phone and advised she is not doing well, she had a fall last week.  Her aide was there assisting her with bathing and breakfast. i advised I would come out later then.  He then called me a few hours later and advised that SW has arranged transportation for her to go to [facility]. Offered assistance to family.   Marylouise Stacks, EMT--Paramedic  02/27/18

## 2018-02-27 NOTE — Progress Notes (Signed)
Went to pts apt to assist when the transport arrived to take her to [facility].   Marylouise Stacks, EMT-Paramedic  02/27/18

## 2018-03-03 DIAGNOSIS — R269 Unspecified abnormalities of gait and mobility: Secondary | ICD-10-CM | POA: Diagnosis not present

## 2018-03-03 DIAGNOSIS — I5022 Chronic systolic (congestive) heart failure: Secondary | ICD-10-CM | POA: Diagnosis not present

## 2018-03-03 DIAGNOSIS — M2550 Pain in unspecified joint: Secondary | ICD-10-CM | POA: Diagnosis not present

## 2018-03-03 DIAGNOSIS — Z9181 History of falling: Secondary | ICD-10-CM | POA: Diagnosis not present

## 2018-03-03 DIAGNOSIS — Z5189 Encounter for other specified aftercare: Secondary | ICD-10-CM | POA: Diagnosis not present

## 2018-03-03 DIAGNOSIS — I5032 Chronic diastolic (congestive) heart failure: Secondary | ICD-10-CM | POA: Diagnosis not present

## 2018-03-03 DIAGNOSIS — R5381 Other malaise: Secondary | ICD-10-CM | POA: Diagnosis not present

## 2018-03-03 DIAGNOSIS — R0602 Shortness of breath: Secondary | ICD-10-CM | POA: Diagnosis not present

## 2018-03-03 DIAGNOSIS — A419 Sepsis, unspecified organism: Secondary | ICD-10-CM | POA: Diagnosis not present

## 2018-03-03 DIAGNOSIS — S83509A Sprain of unspecified cruciate ligament of unspecified knee, initial encounter: Secondary | ICD-10-CM | POA: Diagnosis not present

## 2018-03-05 DEATH — deceased

## 2018-03-06 ENCOUNTER — Telehealth (HOSPITAL_COMMUNITY): Payer: Self-pay

## 2018-03-06 NOTE — Telephone Encounter (Signed)
I called pts brother to f/u on her txp to hospice last week. Son reported that she died last Jul 09, 2022 03-10-2018 at [facility].  Expressed condolences to family and offered support.   Marylouise Stacks, EMT-Paramedic  03/06/18

## 2018-03-08 ENCOUNTER — Encounter: Payer: Medicare HMO | Admitting: Cardiology

## 2018-03-28 ENCOUNTER — Encounter

## 2018-04-03 ENCOUNTER — Encounter (HOSPITAL_COMMUNITY): Payer: Medicare HMO | Admitting: Cardiology

## 2018-05-08 ENCOUNTER — Ambulatory Visit: Payer: Medicare HMO | Admitting: Neurology

## 2018-05-17 ENCOUNTER — Telehealth (HOSPITAL_COMMUNITY): Payer: Self-pay

## 2018-05-17 NOTE — Telephone Encounter (Signed)
Request for Averse Event Info faxed to [facility]

## 2019-05-14 IMAGING — NM NM TUMOR LOCAL/TRACER DISTR WITH SPECT
4 series · 19 of 19 positions shown · non-contrast
Comparison: none

CLINICAL DATA: HEART FAILURE. CONCERN FOR CARDIAC AMYLOIDOSIS.

EXAM:
NUCLEAR MEDICINE TUMOR LOCALIZATION. PYP CARDIAC AMYLOIDOSIS SCAN
WITH SPECT
TECHNIQUE: Following intravenous administration of radiopharmaceutical,
anterior planar images of the chest were obtained. Regions of
interest were placed on the heart and contralateral chest wall for
quantitative assessment. Additional SPECT imaging of the chest was
obtained.
RADIOPHARMACEUTICALS:  20.2 mCi TECHNETIUM 99 PYROPHOSPHATE

[Series 1: spect - (id)_(id)_tra · 2.1mm · 2.07mm/px · 6 of 256 frames shown]
[frame 22/256]
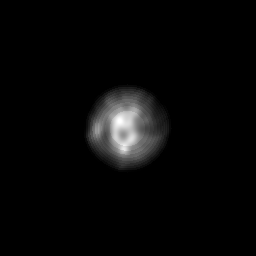
[frame 64/256]
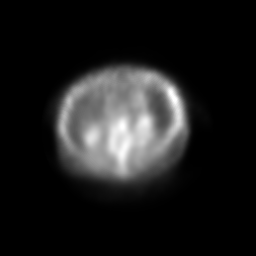
[frame 107/256]
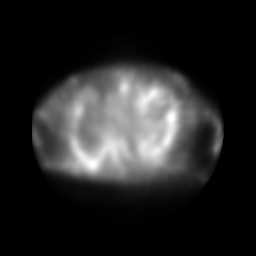
[frame 150/256]
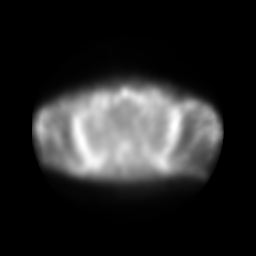
[frame 192/256]
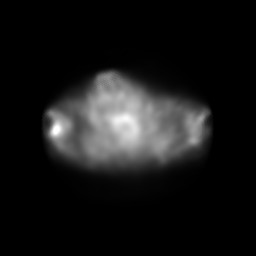
[frame 235/256]
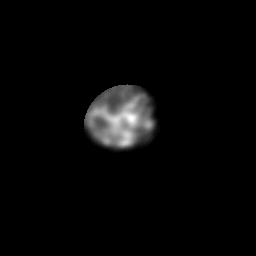

[Series 1: spect - (id)_(id)_cor · 2.1mm · 2.07mm/px · 6 of 256 frames shown]
[frame 22/256]
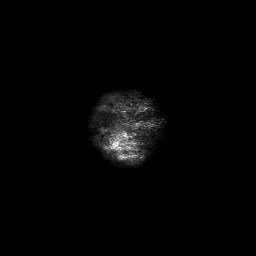
[frame 64/256]
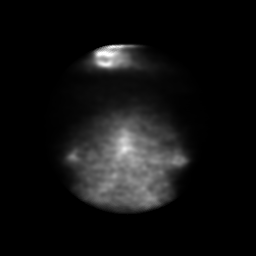
[frame 107/256]
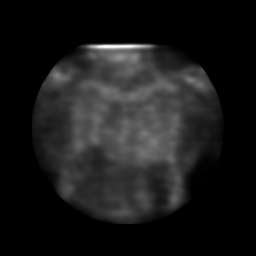
[frame 150/256]
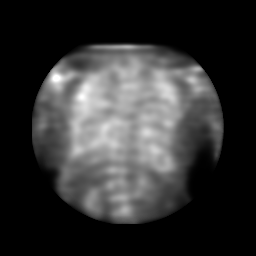
[frame 192/256]
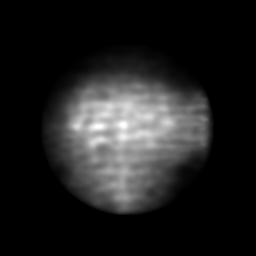
[frame 235/256]
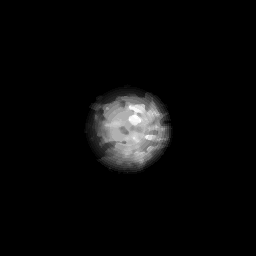

[Series 1: anterior · 4.14mm/px · 1 of 1 slices shown]
[im 1/1]
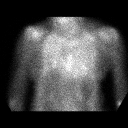

[Series 2: liver spect · 2.07mm/px · 6 of 64 frames shown]
[frame 6/64  full-range]
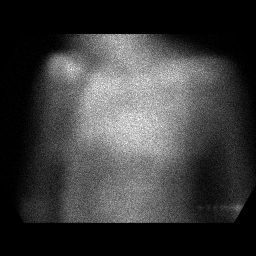
[frame 16/64  full-range]
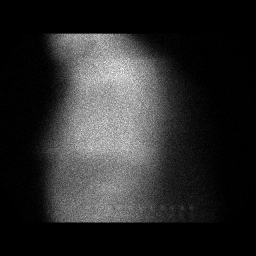
[frame 27/64  full-range]
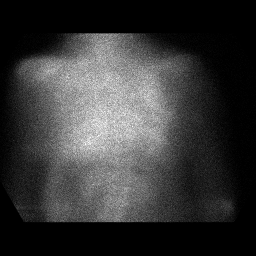
[frame 38/64  full-range]
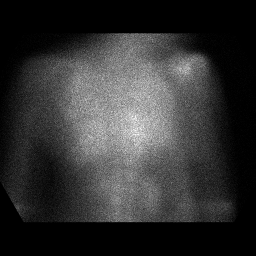
[frame 48/64  full-range]
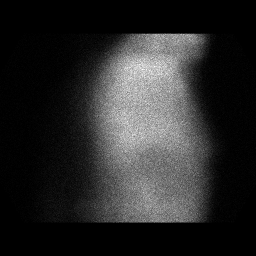
[frame 59/64  full-range]
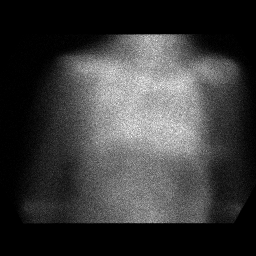

[19 of 19 positions shown; findings below may reference images not displayed]

FINDINGS: Planar Visual assessment:

Anterior planar imaging demonstrates radiotracer uptake within the
heart greater than uptake within the adjacent ribs (Grade 3).

Quantitative assessment :

Quantitative assessment of the cardiac uptake compared to the
contralateral chest wall is equal to 1.36 .

SPECT assessment: SPECT imaging of the chest demonstrates clear
radiotracer accumulation within the LEFT ventricle.
IMPRESSION: Visual and quantitative assessment (grade 3, H/CLL equal 1.36) are
strongly suggestive of transthyretin amyloidosis.
